# Patient Record
Sex: Female | Born: 1965
Health system: Southern US, Community
[De-identification: ages and names within clinical notes are randomized; demographics above are authoritative.]

## PROBLEM LIST (undated history)

## (undated) ENCOUNTER — Ambulatory Visit (HOSPITAL_COMMUNITY): Admission: EM | Payer: 59 | Source: Home / Self Care

## (undated) ENCOUNTER — Ambulatory Visit: Admission: EM

## (undated) DIAGNOSIS — K219 Gastro-esophageal reflux disease without esophagitis: Secondary | ICD-10-CM

## (undated) DIAGNOSIS — N2889 Other specified disorders of kidney and ureter: Secondary | ICD-10-CM

## (undated) DIAGNOSIS — K8020 Calculus of gallbladder without cholecystitis without obstruction: Secondary | ICD-10-CM

## (undated) DIAGNOSIS — D649 Anemia, unspecified: Secondary | ICD-10-CM

## (undated) DIAGNOSIS — T7840XA Allergy, unspecified, initial encounter: Secondary | ICD-10-CM

## (undated) DIAGNOSIS — K5790 Diverticulosis of intestine, part unspecified, without perforation or abscess without bleeding: Secondary | ICD-10-CM

## (undated) DIAGNOSIS — I10 Essential (primary) hypertension: Secondary | ICD-10-CM

## (undated) DIAGNOSIS — K259 Gastric ulcer, unspecified as acute or chronic, without hemorrhage or perforation: Secondary | ICD-10-CM

## (undated) HISTORY — DX: Calculus of gallbladder without cholecystitis without obstruction: K80.20

## (undated) HISTORY — DX: Essential (primary) hypertension: I10

## (undated) HISTORY — DX: Allergy, unspecified, initial encounter: T78.40XA

## (undated) HISTORY — DX: Other specified disorders of kidney and ureter: N28.89

## (undated) HISTORY — DX: Diverticulosis of intestine, part unspecified, without perforation or abscess without bleeding: K57.90

## (undated) HISTORY — DX: Gastric ulcer, unspecified as acute or chronic, without hemorrhage or perforation: K25.9

## (undated) HISTORY — PX: CHOLECYSTECTOMY: SHX55

## (undated) HISTORY — DX: Gastro-esophageal reflux disease without esophagitis: K21.9

## (undated) HISTORY — PX: ABDOMINAL HYSTERECTOMY: SHX81

## (undated) HISTORY — DX: Anemia, unspecified: D64.9

---

## 1991-03-18 DIAGNOSIS — K8020 Calculus of gallbladder without cholecystitis without obstruction: Secondary | ICD-10-CM

## 1991-03-18 HISTORY — DX: Calculus of gallbladder without cholecystitis without obstruction: K80.20

## 2016-04-14 ENCOUNTER — Ambulatory Visit (INDEPENDENT_AMBULATORY_CARE_PROVIDER_SITE_OTHER): Payer: Medicare Other | Admitting: Emergency Medicine

## 2016-04-14 VITALS — BP 98/66 | HR 90 | Temp 98.3°F | Resp 16 | Ht 63.25 in | Wt 191.0 lb

## 2016-04-14 DIAGNOSIS — I10 Essential (primary) hypertension: Secondary | ICD-10-CM

## 2016-04-14 MED ORDER — AMLODIPINE BESY-BENAZEPRIL HCL 10-40 MG PO CAPS: 1.0000 | ORAL_CAPSULE | Freq: Every day | ORAL | 11 refills | Status: DC

## 2016-04-14 MED ORDER — ZOLPIDEM TARTRATE 10 MG PO TABS: 10.0000 mg | ORAL_TABLET | Freq: Every evening | ORAL | 2 refills | Status: DC | PRN

## 2016-04-14 MED ORDER — HYDROCHLOROTHIAZIDE 25 MG PO TABS: 25.0000 mg | ORAL_TABLET | Freq: Every day | ORAL | 11 refills | Status: DC

## 2016-04-14 NOTE — Progress Notes (Signed)
Natalie Edwards 51 y.o.   Chief Complaint  Patient presents with  . estab care    HISTORY OF PRESENT ILLNESS: This is a 51 y.o. female recently moved to the area; needs to establish care and med refills.  HPI   Prior to Admission medications   Medication Sig Start Date End Date Taking? Authorizing Provider  amLODipine-benazepril (LOTREL) 10-40 MG capsule Take 1 capsule by mouth daily. 04/14/16 05/14/16 Yes Silvia Hightower Joellen Jersey, MD  famotidine (PEPCID) 40 MG tablet Take 40 mg by mouth daily.   Yes Historical Provider, MD  hydrochlorothiazide (HYDRODIURIL) 25 MG tablet Take 1 tablet (25 mg total) by mouth daily. 04/14/16 05/14/16 Yes Nadira Single Joellen Jersey, MD  hydrOXYzine (ATARAX/VISTARIL) 25 MG tablet Take 25 mg by mouth 3 (three) times daily as needed.   Yes Historical Provider, MD  ibuprofen (ADVIL,MOTRIN) 800 MG tablet Take 800 mg by mouth every 8 (eight) hours as needed.   Yes Historical Provider, MD  zolpidem (AMBIEN) 10 MG tablet Take 1 tablet (10 mg total) by mouth at bedtime as needed for sleep. 04/14/16 05/14/16 Yes [place], MD    No Known Allergies  There are no active problems to display for this patient.   Past Medical History:  Diagnosis Date  . Allergy   . Hypertension     No past surgical history on file.  Social History   Social History  . Marital status: Single    Spouse name: N/A  . Number of children: N/A  . Years of education: N/A   Occupational History  . Not on file.   Social History Main Topics  . Smoking status: Current Every Day Smoker  . Smokeless tobacco: Never Used  . Alcohol use Yes     Comment: occasional  . Drug use: No  . Sexual activity: Not on file     Comment: hysterectomy   Other Topics Concern  . Not on file   Social History Narrative  . No narrative on file    No family history on file.   Review of Systems  Constitutional: Negative for chills, fever, malaise/fatigue and weight loss.  HENT: Negative for  congestion, ear pain, hearing loss, nosebleeds, sinus pain and sore throat.   Eyes: Negative for blurred vision, double vision, discharge and redness.  Respiratory: Negative for cough, hemoptysis, shortness of breath and wheezing.   Cardiovascular: Negative for chest pain, palpitations, claudication and leg swelling.  Gastrointestinal: Negative for abdominal pain, blood in stool, diarrhea, melena, nausea and vomiting.  Genitourinary: Negative for dysuria, flank pain and hematuria.  Musculoskeletal: Negative for back pain.  Skin: Negative for rash.  Neurological: Negative for dizziness, weakness and headaches.  Endo/Heme/Allergies: Does not bruise/bleed easily.  Psychiatric/Behavioral: The patient has insomnia.   All other systems reviewed and are negative.  Vitals:   04/14/16 1410 04/14/16 1449  BP: 130/90 98/66  Pulse: 90   Resp: 16   Temp: 98.3 F (36.8 C)      Physical Exam  Constitutional: She is oriented to person, place, and time. She appears well-developed and well-nourished.  HENT:  Head: Normocephalic and atraumatic.  Nose: Nose normal.  Mouth/Throat: Oropharynx is clear and moist.  Eyes: Conjunctivae and EOM are normal. Pupils are equal, round, and reactive to light.  Neck: Normal range of motion. Neck supple.  Cardiovascular: Normal rate, regular rhythm and normal heart sounds.   Pulmonary/Chest: Effort normal and breath sounds normal.  Abdominal: Soft. Bowel sounds are normal. She exhibits no distension. There  is no tenderness.  Musculoskeletal: Normal range of motion.  Neurological: She is alert and oriented to person, place, and time. No sensory deficit. She exhibits normal muscle tone.  Skin: Skin is warm and dry. Capillary refill takes less than 2 seconds.  Psychiatric: She has a normal mood and affect. Her behavior is normal.  Vitals reviewed.    ASSESSMENT & PLAN: [name] was seen today for estab care.  Diagnoses and all orders for this visit:  Essential  hypertension -     CBC with Differential/Platelet -     Comprehensive metabolic panel -     TSH -     Lipid panel  Other orders -     zolpidem (AMBIEN) 10 MG tablet; Take 1 tablet (10 mg total) by mouth at bedtime as needed for sleep. -     hydrochlorothiazide (HYDRODIURIL) 25 MG tablet; Take 1 tablet (25 mg total) by mouth daily. -     amLODipine-benazepril (LOTREL) 10-40 MG capsule; Take 1 capsule by mouth daily.       Agustina Caroli, MD [facility]

## 2016-04-14 NOTE — Patient Instructions (Signed)
     IF you received an x-ray today, you will receive an invoice from [facility]. Please contact [facility] at [phone] with questions or concerns regarding your invoice.   IF you received labwork today, you will receive an invoice from [facility]. Please contact [facility] at [phone] with questions or concerns regarding your invoice.   Our billing staff will not be able to assist you with questions regarding bills from these companies.  You will be contacted with the lab results as soon as they are available. The fastest way to get your results is to activate your My Chart account. Instructions are located on the last page of this paperwork. If you have not heard from us regarding the results in 2 weeks, please contact this office.     

## 2016-04-15 LAB — LIPID PANEL
CHOL/HDL RATIO: 4.3 ratio (ref 0.0–4.4)
Cholesterol, Total: 215 mg/dL — ABNORMAL HIGH (ref 100–199)
HDL: 50 mg/dL (ref >39)
LDL Calculated: 133 mg/dL — ABNORMAL HIGH (ref 0–99)
Triglycerides: 162 mg/dL — ABNORMAL HIGH (ref 0–149)
VLDL Cholesterol Cal: 32 mg/dL (ref 5–40)

## 2016-04-15 LAB — CBC WITH DIFFERENTIAL/PLATELET
BASOS ABS: 0 10*3/uL (ref 0.0–0.2)
BASOS: 0 %
EOS (ABSOLUTE): 0.3 10*3/uL (ref 0.0–0.4)
Eos: 2 %
Hematocrit: 36.9 % (ref 34.0–46.6)
Hemoglobin: 11.9 g/dL (ref 11.1–15.9)
Immature Grans (Abs): 0 10*3/uL (ref 0.0–0.1)
Immature Granulocytes: 0 %
LYMPHS ABS: 6.2 10*3/uL — AB (ref 0.7–3.1)
Lymphs: 40 %
MCH: 25.1 pg — AB (ref 26.6–33.0)
MCHC: 32.2 g/dL (ref 31.5–35.7)
MCV: 78 fL — AB (ref 79–97)
Monocytes Absolute: 0.8 10*3/uL (ref 0.1–0.9)
Monocytes: 5 %
NEUTROS ABS: 8.3 10*3/uL — AB (ref 1.4–7.0)
Neutrophils: 53 %
Platelets: 552 10*3/uL — ABNORMAL HIGH (ref 150–379)
RBC: 4.75 x10E6/uL (ref 3.77–5.28)
RDW: 17.8 % — ABNORMAL HIGH (ref 12.3–15.4)
WBC: 15.6 10*3/uL — ABNORMAL HIGH (ref 3.4–10.8)

## 2016-04-15 LAB — TSH: TSH: 1.05 u[IU]/mL (ref 0.450–4.500)

## 2016-04-15 LAB — COMPREHENSIVE METABOLIC PANEL
A/G RATIO: 1.5 (ref 1.2–2.2)
ALK PHOS: 88 IU/L (ref 39–117)
ALT: 25 IU/L (ref 0–32)
AST: 15 IU/L (ref 0–40)
Albumin: 4.3 g/dL (ref 3.5–5.5)
BUN/Creatinine Ratio: 13 (ref 9–23)
BUN: 16 mg/dL (ref 6–24)
CHLORIDE: 106 mmol/L (ref 96–106)
CO2: 19 mmol/L (ref 18–29)
Calcium: 9.6 mg/dL (ref 8.7–10.2)
Creatinine, Ser: 1.22 mg/dL — ABNORMAL HIGH (ref 0.57–1.00)
GFR calc non Af Amer: 51 mL/min/{1.73_m2} — ABNORMAL LOW (ref >59)
GFR, EST AFRICAN AMERICAN: 59 mL/min/{1.73_m2} — AB (ref >59)
GLUCOSE: 98 mg/dL (ref 65–99)
Globulin, Total: 2.8 g/dL (ref 1.5–4.5)
POTASSIUM: 4.7 mmol/L (ref 3.5–5.2)
Sodium: 143 mmol/L (ref 134–144)
TOTAL PROTEIN: 7.1 g/dL (ref 6.0–8.5)

## 2016-04-16 NOTE — Addendum Note (Signed)
Addended by: Davina Poke on: 04/16/2016 10:33 AM   Modules accepted: Orders

## 2016-04-21 ENCOUNTER — Other Ambulatory Visit: Payer: Self-pay | Admitting: *Deleted

## 2016-04-21 ENCOUNTER — Telehealth: Payer: Self-pay

## 2016-04-21 MED ORDER — FAMOTIDINE 40 MG PO TABS: 40.0000 mg | ORAL_TABLET | Freq: Every day | ORAL | 0 refills | Status: DC

## 2016-04-21 MED ORDER — IBUPROFEN 800 MG PO TABS: 800.0000 mg | ORAL_TABLET | Freq: Three times a day (TID) | ORAL | 1 refills | Status: DC | PRN

## 2016-04-21 NOTE — Telephone Encounter (Signed)
yes

## 2016-04-21 NOTE — Telephone Encounter (Signed)
PATIENT STATES SHE CAME IN LAST Monday TO ESTABLISH CARE WITH DR. SAGARDIA. SHE SAID SHE TOLD HIM ALL THE MEDICATIONS THAT SHE TAKES, BUT 2 OF THEM WERE NOT CALLED INTO HER PHARMACY. SHE NEEDS TO GET HER FAMOTIDINE 40 MG AND IBUPROFEN 800 MG. SHE SAID SHE WOKE UP THIS MORNING IN SEVERE STOMACH PAIN AND SHE REALLY CAN'T WAIT 24 TO 3 HOURS FOR A CALL BACK. SHE IS ABOUT TO GO INTO AN ANXIETY ATTACK BECAUSE SHE IS IN SO MUCH PAIN. SHE WOULD LIKE TO GET HER FAMOTIDINE FILLED AS SOON AS POSSIBLE. BEST PHONE [phone] (CELL) PHARMACY CHOICE IS [facility] ON [address].  [place]

## 2016-04-21 NOTE — Progress Notes (Unsigned)
90

## 2016-04-21 NOTE — Telephone Encounter (Signed)
Ok to fill these?

## 2016-06-19 ENCOUNTER — Other Ambulatory Visit: Payer: Self-pay | Admitting: Emergency Medicine

## 2016-07-04 ENCOUNTER — Other Ambulatory Visit: Payer: Self-pay | Admitting: Emergency Medicine

## 2016-07-05 NOTE — Telephone Encounter (Signed)
04/14/16 last ov and refill

## 2016-07-05 NOTE — Telephone Encounter (Signed)
Called to [facility]. 

## 2016-07-09 DIAGNOSIS — Z0271 Encounter for disability determination: Secondary | ICD-10-CM

## 2016-07-23 ENCOUNTER — Other Ambulatory Visit: Payer: Self-pay | Admitting: Emergency Medicine

## 2016-07-31 ENCOUNTER — Encounter (HOSPITAL_COMMUNITY): Payer: Self-pay

## 2016-07-31 ENCOUNTER — Emergency Department (HOSPITAL_COMMUNITY)
Admission: EM | Admit: 2016-07-31 | Discharge: 2016-07-31 | Disposition: A | Discharge status: Home / Self Care | Payer: Medicare Other | Attending: Emergency Medicine | Admitting: Emergency Medicine

## 2016-07-31 ENCOUNTER — Emergency Department (HOSPITAL_COMMUNITY): Payer: Medicare Other

## 2016-07-31 DIAGNOSIS — Z79899 Other long term (current) drug therapy: Secondary | ICD-10-CM | POA: Insufficient documentation

## 2016-07-31 DIAGNOSIS — Z8679 Personal history of other diseases of the circulatory system: Secondary | ICD-10-CM | POA: Insufficient documentation

## 2016-07-31 DIAGNOSIS — K2900 Acute gastritis without bleeding: Secondary | ICD-10-CM | POA: Diagnosis not present

## 2016-07-31 DIAGNOSIS — I10 Essential (primary) hypertension: Secondary | ICD-10-CM | POA: Insufficient documentation

## 2016-07-31 DIAGNOSIS — R109 Unspecified abdominal pain: Secondary | ICD-10-CM | POA: Diagnosis present

## 2016-07-31 DIAGNOSIS — F17200 Nicotine dependence, unspecified, uncomplicated: Secondary | ICD-10-CM | POA: Insufficient documentation

## 2016-07-31 DIAGNOSIS — Z87898 Personal history of other specified conditions: Secondary | ICD-10-CM | POA: Insufficient documentation

## 2016-07-31 LAB — CBC
HEMATOCRIT: 37.1 % (ref 36.0–46.0)
HEMOGLOBIN: 12.3 g/dL (ref 12.0–15.0)
MCH: 26.1 pg (ref 26.0–34.0)
MCHC: 33.2 g/dL (ref 30.0–36.0)
MCV: 78.8 fL (ref 78.0–100.0)
Platelets: 491 10*3/uL — ABNORMAL HIGH (ref 150–400)
RBC: 4.71 MIL/uL (ref 3.87–5.11)
RDW: 17.9 % — ABNORMAL HIGH (ref 11.5–15.5)
WBC: 16.4 10*3/uL — ABNORMAL HIGH (ref 4.0–10.5)

## 2016-07-31 LAB — COMPREHENSIVE METABOLIC PANEL
ALBUMIN: 3.9 g/dL (ref 3.5–5.0)
ALT: 17 U/L (ref 14–54)
ANION GAP: 8 (ref 5–15)
AST: 18 U/L (ref 15–41)
Alkaline Phosphatase: 74 U/L (ref 38–126)
BILIRUBIN TOTAL: 0.3 mg/dL (ref 0.3–1.2)
BUN: 8 mg/dL (ref 6–20)
CHLORIDE: 108 mmol/L (ref 101–111)
CO2: 24 mmol/L (ref 22–32)
Calcium: 9.5 mg/dL (ref 8.9–10.3)
Creatinine, Ser: 1.02 mg/dL — ABNORMAL HIGH (ref 0.44–1.00)
GFR calc Af Amer: >60 mL/min (ref >60)
GFR calc non Af Amer: >60 mL/min (ref >60)
GLUCOSE: 93 mg/dL (ref 65–99)
POTASSIUM: 4 mmol/L (ref 3.5–5.1)
SODIUM: 140 mmol/L (ref 135–145)
TOTAL PROTEIN: 7.4 g/dL (ref 6.5–8.1)

## 2016-07-31 LAB — LIPASE, BLOOD: LIPASE: 22 U/L (ref 11–51)

## 2016-07-31 MED ORDER — IOPAMIDOL (ISOVUE-300) INJECTION 61%
INTRAVENOUS | Status: AC
  Administered 2016-07-31: 100 mL
  Filled 2016-07-31: qty 100

## 2016-07-31 MED ORDER — SUCRALFATE 1 G PO TABS: 1.0000 g | ORAL_TABLET | Freq: Three times a day (TID) | ORAL | 0 refills | Status: DC

## 2016-07-31 MED ORDER — ESOMEPRAZOLE MAGNESIUM 40 MG PO CPDR: 40.0000 mg | DELAYED_RELEASE_CAPSULE | Freq: Every day | ORAL | 0 refills | Status: DC

## 2016-07-31 NOTE — Discharge Instructions (Signed)
Please follow up with your family doctor to recheck you white blood cell count that was elevated today.  You had a CT scan of your abdomen that noted findings that will need to be followed up by your family doctor (small cysts on your kidneys and adrenal gland).  Get rechecked immediately if you have any new or worrisome symptoms.

## 2016-07-31 NOTE — ED Notes (Signed)
Pt verbalizes DC teaching and need to follow up with PCP and GI. NAD. Ambulatory at [facility].

## 2016-07-31 NOTE — ED Notes (Signed)
Patient up to desk, asking about delays for rooming.  This RN stated she would review her results and room her appropriately.  Patient stating she would like to leave.  This RN will follow up with Pod C provider.

## 2016-07-31 NOTE — ED Notes (Signed)
Patient updated on rooming 

## 2016-07-31 NOTE — ED Triage Notes (Signed)
Pt arrives to ER by Urgent Care to have a CT scan done. Pt reports that she has been having LUQ abdominal pain and tenderness. She went to urgent care and had an abdominal xray done which showed an unidentified object to LUQ of her abdomen. Possible "surgical clip" but the FNP at urgent care, Katheran Awe, stated to his RN via telephone, that the unidentified object is too high up to be a surgical clip because the only abdominal surgical procedure the patient has ever had was a hysterectomy in 2008.

## 2016-07-31 NOTE — ED Provider Notes (Signed)
[facility] DEPT Provider Note   CSN: [id number] Arrival date & time: 07/31/16  1611  By signing my name below, I, Natalie Edwards, attest that this documentation has been prepared under the direction and in the presence of No att. providers found. Electronically Signed: Mayer Edwards, Scribe. 07/31/16. 7:11 PM.  History   Chief Complaint Chief Complaint  Patient presents with  . needs ct  The history is provided by the patient. No language interpreter was used.   HPI Comments: Natalie Edwards is a 51 y.o. female with a PMHx of stomach ulcers and HTN who presents to the Emergency Department complaining of constant, chronic, left-sided abdominal pain for years. She states she went to urgent care for her symptoms, but they sent her to the ED to get a CT scan. She notes she has been increasingly taking her Pepcid to alleviate the pain. She states she was taking ibuprofen but stopped. She notes she does not have a gastroenterologist or PCP in the area as she just moved here. She denies associated fever, nausea, vomiting, diarrhea, black/bloody stools, and dysuria.   Past Medical History:  Diagnosis Date  . Allergy   . Hypertension     There are no active problems to display for this patient.   Past Surgical History:  Procedure Laterality Date  . ABDOMINAL HYSTERECTOMY      OB History    No data available       Home Medications    Prior to Admission medications   Medication Sig Start Date End Date Taking? Authorizing Provider  amLODipine-benazepril (LOTREL) 10-40 MG capsule Take 1 capsule by mouth daily. 04/14/16 05/14/16  Horald Pollen, MD  esomeprazole (NEXIUM) 40 MG capsule Take 1 capsule (40 mg total) by mouth daily. 07/31/16   Quintella Reichert, MD  hydrochlorothiazide (HYDRODIURIL) 25 MG tablet Take 1 tablet (25 mg total) by mouth daily. 04/14/16 05/14/16  Horald Pollen, MD  hydrOXYzine (ATARAX/VISTARIL) 25 MG tablet Take 25 mg by mouth 3 (three) times daily as  needed.    [provider]  sucralfate (CARAFATE) 1 g tablet Take 1 tablet (1 g total) by mouth 4 (four) times daily -  with meals and at bedtime. 07/31/16   Quintella Reichert, MD  zolpidem (AMBIEN) 10 MG tablet TAKE 1 TABLET BY MOUTH AT BEDTIME AS NEEDED FOR SLEEP 07/05/16   Horald Pollen, MD    Family History No family history on file.  Social History Social History  Substance Use Topics  . Smoking status: Current Every Day Smoker  . Smokeless tobacco: Never Used  . Alcohol use Yes     Comment: occasional     Allergies   Patient has no known allergies.   Review of Systems Review of Systems  Constitutional: Negative for fever.  Gastrointestinal: Positive for abdominal pain. Negative for diarrhea, nausea and vomiting.  Genitourinary: Negative for dysuria.  All other systems reviewed and are negative.    Physical Exam Updated Vital Signs BP 136/80 (BP Location: Left Arm)   Pulse 78   Temp 97.9 F (36.6 C) (Oral)   Resp 18   SpO2 98%   Physical Exam  Constitutional: She is oriented to person, place, and time. She appears well-developed and well-nourished.  HENT:  Head: Normocephalic and atraumatic.  Cardiovascular: Normal rate and regular rhythm.   No murmur heard. Pulmonary/Chest: Effort normal and breath sounds normal. No respiratory distress.  Abdominal: Soft. There is no tenderness. There is no rebound and no guarding.  Mild  LUQ, left mid abdominal tenderness  Musculoskeletal: She exhibits no edema or tenderness.  Neurological: She is alert and oriented to person, place, and time.  Skin: Skin is warm and dry.  Psychiatric: She has a normal mood and affect. Her behavior is normal.  Nursing note and vitals reviewed.    ED Treatments / Results  DIAGNOSTIC STUDIES: Oxygen Saturation is 100% on RA, normal by my interpretation.    COORDINATION OF CARE: 7:08 PM Discussed treatment plan with pt at bedside and pt agreed to plan. Labs (all labs  ordered are listed, but only abnormal results are displayed) Labs Reviewed  COMPREHENSIVE METABOLIC PANEL - Abnormal; Notable for the following:       Result Value   Creatinine, Ser 1.02 (*)    All other components within normal limits  CBC - Abnormal; Notable for the following:    WBC 16.4 (*)    RDW 17.9 (*)    Platelets 491 (*)    All other components within normal limits  LIPASE, BLOOD  URINALYSIS, ROUTINE W REFLEX MICROSCOPIC    EKG  EKG Interpretation None       Radiology Ct Abdomen Pelvis W Contrast  Result Date: 07/31/2016 CLINICAL DATA:  Left upper quadrant abdominal pain with tenderness. There is a report of x-ray showing an unidentified object in the left upper quadrant, possibly a surgical clip. EXAM: CT ABDOMEN AND PELVIS WITH CONTRAST TECHNIQUE: Multidetector CT imaging of the abdomen and pelvis was performed using the standard protocol following bolus administration of intravenous contrast. CONTRAST:  138mL ISOVUE-300 IOPAMIDOL (ISOVUE-300) INJECTION 61% COMPARISON:  None. FINDINGS: Lower chest: Clear lung bases.  Heart is normal in size. Hepatobiliary: No focal liver abnormality is seen. Status post cholecystectomy. No biliary dilatation. Pancreas: Unremarkable. No pancreatic ductal dilatation or surrounding inflammatory changes. Spleen: Normal in size without focal abnormality. Adrenals/Urinary Tract: 17 mm left adrenal mass with relative washout of 55% consistent with an adenoma. Normal right adrenal gland. Bilateral low-attenuation renal masses, largest on the right arising from the upper pole measuring 2.6 cm. Largest on the left arising from the midpole measuring 2.9 cm. The larger masses or cysts. The smaller masses are likely cysts but are too small to fully characterize. No renal stones. No hydronephrosis. Normal ureters. Normal bladder. Stomach/Bowel: The wall of the gastric antrum appears thickened. The stomach is not well distended. No other wall thickening. No  adjacent inflammation. Small bowel unremarkable. There are colonic diverticula mostly along the sigmoid. No diverticulitis. No other colon abnormality. Normal appendix visualized. Vascular/Lymphatic: Aortic atherosclerosis. No enlarged abdominal or pelvic lymph nodes. Reproductive: Status post hysterectomy. No adnexal masses. Other: No abdominal wall hernia or abnormality. No abdominopelvic ascites. Musculoskeletal: No acute or significant osseous findings. IMPRESSION: 1. Apparent wall thickening of the gastric antrum suggesting gastritis. This may, alternatively, be due to lack of distension. Recommend follow-up upper GI or endoscopy on a nonemergent basis. 2. No other evidence of an acute abnormality within the abdomen or pelvis. 3. Left adrenal adenoma. 4. Bilateral low-density renal masses consistent with cysts, some too small to fully characterize. 5. Colonic diverticula without evidence of diverticulitis. 6. Aortic atherosclerosis. 7. Status post cholecystectomy and hysterectomy. Electronically Signed   By: Lajean Manes M.D.   On: 07/31/2016 17:27    Procedures Procedures (including critical care time)  Medications Ordered in ED Medications  iopamidol (ISOVUE-300) 61 % injection (100 mLs  Contrast Given 07/31/16 1659)     Initial Impression / Assessment and Plan / ED Course  I have reviewed the triage vital signs and the nursing notes.  Pertinent labs & imaging results that were available during my care of the patient were reviewed by me and considered in my medical decision making (see chart for details).     Patient here for evaluation of abdominal pain, outpatient x-ray with clip and abdomen. CT abdomen in the emergency department demonstrates gastritis without evidence of perforation. CBC demonstrates leukocytosis and this is present on priors. The patient does not have acute abdomen on examination. Counseled patient on home care for likely gastritis, question peptic ulcer disease.  Discussed outpatient follow-up as well as return precautions. Also discussed incidental findings on CT scan and need for outpatient follow-up for recheck.  Final Clinical Impressions(s) / ED Diagnoses   Final diagnoses:  Acute gastritis without hemorrhage, unspecified gastritis type    New Prescriptions Discharge Medication List as of 07/31/2016  7:18 PM    START taking these medications   Details  esomeprazole (NEXIUM) 40 MG capsule Take 1 capsule (40 mg total) by mouth daily., Starting Thu 07/31/2016, Print    sucralfate (CARAFATE) 1 g tablet Take 1 tablet (1 g total) by mouth 4 (four) times daily -  with meals and at bedtime., Starting Thu 07/31/2016, Print      I personally performed the services described in this documentation, which was scribed in my presence. The recorded information has been reviewed and is accurate.     Quintella Reichert, MD 07/31/16 2026

## 2016-09-04 ENCOUNTER — Telehealth: Payer: Self-pay | Admitting: Hematology and Oncology

## 2016-09-04 NOTE — Telephone Encounter (Signed)
Spoke with patient re new patient appointment with Dr. Lebron Conners 6/26 @ 11 am. Demographic and insurance information confirmed.

## 2016-09-08 ENCOUNTER — Encounter: Payer: Medicare Other | Admitting: Hematology and Oncology

## 2016-09-09 ENCOUNTER — Ambulatory Visit (HOSPITAL_BASED_OUTPATIENT_CLINIC_OR_DEPARTMENT_OTHER): Payer: Medicare Other | Admitting: Hematology and Oncology

## 2016-09-09 ENCOUNTER — Encounter: Payer: Self-pay | Admitting: Hematology and Oncology

## 2016-09-09 ENCOUNTER — Ambulatory Visit (HOSPITAL_BASED_OUTPATIENT_CLINIC_OR_DEPARTMENT_OTHER): Payer: Medicare Other

## 2016-09-09 ENCOUNTER — Other Ambulatory Visit (HOSPITAL_COMMUNITY)
Admission: RE | Admit: 2016-09-09 | Discharge: 2016-09-09 | Disposition: A | Discharge status: Home / Self Care | Payer: Medicare Other | Source: Ambulatory Visit | Attending: Hematology and Oncology | Admitting: Hematology and Oncology

## 2016-09-09 ENCOUNTER — Telehealth: Payer: Self-pay | Admitting: Hematology and Oncology

## 2016-09-09 VITALS — BP 121/61 | HR 78 | Temp 98.0°F | Resp 20 | Ht 63.0 in | Wt 187.8 lb

## 2016-09-09 DIAGNOSIS — D72829 Elevated white blood cell count, unspecified: Secondary | ICD-10-CM

## 2016-09-09 DIAGNOSIS — D75839 Thrombocytosis, unspecified: Secondary | ICD-10-CM

## 2016-09-09 DIAGNOSIS — K219 Gastro-esophageal reflux disease without esophagitis: Secondary | ICD-10-CM

## 2016-09-09 DIAGNOSIS — D473 Essential (hemorrhagic) thrombocythemia: Secondary | ICD-10-CM | POA: Diagnosis present

## 2016-09-09 DIAGNOSIS — K3189 Other diseases of stomach and duodenum: Secondary | ICD-10-CM | POA: Insufficient documentation

## 2016-09-09 DIAGNOSIS — K317 Polyp of stomach and duodenum: Secondary | ICD-10-CM | POA: Insufficient documentation

## 2016-09-09 LAB — CBC & DIFF AND RETIC
BASO%: 0.1 % (ref 0.0–2.0)
Basophils Absolute: 0 10*3/uL (ref 0.0–0.1)
EOS%: 2.8 % (ref 0.0–7.0)
Eosinophils Absolute: 0.4 10*3/uL (ref 0.0–0.5)
HCT: 35.6 % (ref 34.8–46.6)
HGB: 11.4 g/dL — ABNORMAL LOW (ref 11.6–15.9)
IMMATURE RETIC FRACT: 9.5 % (ref 1.60–10.00)
LYMPH#: 5.9 10*3/uL — AB (ref 0.9–3.3)
LYMPH%: 43.6 % (ref 14.0–49.7)
MCH: 25.8 pg (ref 25.1–34.0)
MCHC: 32 g/dL (ref 31.5–36.0)
MCV: 80.5 fL (ref 79.5–101.0)
MONO#: 1 10*3/uL — AB (ref 0.1–0.9)
MONO%: 7.6 % (ref 0.0–14.0)
NEUT%: 45.9 % (ref 38.4–76.8)
NEUTROS ABS: 6.2 10*3/uL (ref 1.5–6.5)
Platelets: 523 10*3/uL — ABNORMAL HIGH (ref 145–400)
RBC: 4.42 10*6/uL (ref 3.70–5.45)
RDW: 16.8 % — ABNORMAL HIGH (ref 11.2–14.5)
RETIC CT ABS: 41.55 10*3/uL (ref 33.70–90.70)
Retic %: 0.94 % (ref 0.70–2.10)
WBC: 13.6 10*3/uL — ABNORMAL HIGH (ref 3.9–10.3)

## 2016-09-09 LAB — IRON AND TIBC
%SAT: 7 % — ABNORMAL LOW (ref 21–57)
IRON: 24 ug/dL — AB (ref 41–142)
TIBC: 344 ug/dL (ref 236–444)
UIBC: 320 ug/dL (ref 120–384)

## 2016-09-09 LAB — CHCC SMEAR

## 2016-09-09 LAB — LACTATE DEHYDROGENASE: LDH: 197 U/L (ref 125–245)

## 2016-09-09 LAB — FERRITIN: FERRITIN: 23 ng/mL (ref 9–269)

## 2016-09-09 LAB — TECHNOLOGIST REVIEW

## 2016-09-09 NOTE — Telephone Encounter (Signed)
Appointments scheduled per 09/09/16 los. Lab added for today, per 09/09/16 los. Patient was given a copy of the AV report and appointment schedule, per 09/09/16 los.

## 2016-09-09 NOTE — Progress Notes (Signed)
[facility] New Visit:  Assessment: Thrombocytosis ([place]) 51 year old female with previous history of peptic ulcer disease with gastric ulcers and history of upper GI bleeding. Referred for evaluation of hematological abnormalities which consist of persistent leukocytosis and thrombocytosis.  Primary on my differential is gastrointestinal bleeding when recent imaging is taken into account demonstrating gastric wall thickening without evidence of retroperitoneal lymphadenopathy or splenomegaly. The thrombocytosis in this context would be reactive. Patient does have decrease in the count which supports this notion. Leukocytosis in this context may be due to an inflammatory reaction, stress, but also can be attributable to active tobacco abuse.  Alternatively, the pattern of elevated blood counts may be suggestive of presence of myelodysplastic syndrome or myeloproliferative neoplasm. Peripheral blood does not appear to contain any significant immature forms to suggest presence of acute leukemia although it is not fully excluded this point in time.   She is already scheduled to undergo EGD her this week which I agree with.  Plan: --Labs today: CBC/diff, CMP, LDH, JAK2 with reflex testing for CALR and MPL, Iron and ferritin --Proceed with EGD as scheduled --RTC 2 weeks for test result review   Orders Placed This Encounter  Procedures  . CBC & Diff and Retic    Standing Status:   Future    Number of Occurrences:   1    Standing Expiration Date:   09/09/2017  . Smear    Standing Status:   Future    Number of Occurrences:   1    Standing Expiration Date:   09/09/2017  . Ferritin    Standing Status:   Future    Number of Occurrences:   1    Standing Expiration Date:   09/09/2017  . Iron and TIBC    Standing Status:   Future    Number of Occurrences:   1    Standing Expiration Date:   09/09/2017  . Lactate dehydrogenase (LDH)    Standing Status:   Future    Number of  Occurrences:   1    Standing Expiration Date:   09/09/2017  . Flow Cytometry    Standing Status:   Future    Number of Occurrences:   1    Standing Expiration Date:   09/09/2017  . JAK2 (INCLUDING V617F AND EXON 12), MPL,& CALR W/RFL MPN PANEL (NGS)    Standing Status:   Future    Number of Occurrences:   1    Standing Expiration Date:   09/09/2017    All questions were answered.  . The patient knows to call the clinic with any problems, questions or concerns.  This note was electronically signed.    History of Presenting Illness Natalie Edwards 51 y.o. presenting to the [facility] for Evaluation of thrombocytosis. Please see hematological trends below in the oncological history. She has a history of chronic left abdominal pain that has been previously evaluated in [place], [place]. Patient does have a history of gastric ulcers, depression, tobacco abuse. She also has history of heavy menstrual bleeding and metrorrhagia which led to elective hysterectomy and possible bilateral oophorectomy in the past. Patient has recently relocated to [place] and presented to the emergency room on 07/31/16 with exacerbation of the abdominal pain leading to additional imaging and lab work. Patient has been on chronic esomeprazole, but does not believe that that has been helping her much recently.  She denies fevers, chills, night sweats. She does experience hot flashes and chills.  She complains of poor sleep due to persistent thoughts and also due to abdominal discomfort. Abdominal discomfort associated with gastroesophageal reflux, heartburn, occasional nausea and vomiting. Patient has had no episodes of hematemesis. She has had melanoma in the past, but not recently. She is prone to constipation and occasionally has red blood per rectum that is attributed to passage of hard stools irritating hemorrhoids. She also has chronic cough that is attributed to her reflux and ongoing tobacco abuse. Patient  denies any recent unexpected weight loss. Denies any new neurological, urological, or dermatological symptoms.  Oncological/Hematological History: --Labs, 04/14/16: WBC 15.6, Hgb 11.9, MCV 78.0, MCH 25.1, Plt 552 --Labs, 07/31/16: WBC 16.4, Hgb 12.3, MCV 78.8, MCH 26.1, Plt 491; Vit D 7.09 --CT A/P, 07/31/16: Gastric wall thickening. No retroperitoneal, perigastric, or pelvic lymphadenopathy. No splenomegaly. --Labs, 08/23/16: WBC 17.1, Hgb 11.6, MCV 82.0, MCH 26.0, Plt 628;   Medical History: Past Medical History:  Diagnosis Date  . Allergy   . Gall bladder stones 1993  . Hypertension     Surgical History: Past Surgical History:  Procedure Laterality Date  . ABDOMINAL HYSTERECTOMY      Family History: Family History  Problem Relation Age of Onset  . Cancer Maternal Uncle        Lung  . Cancer Maternal Uncle        Lung  . Cancer Maternal Uncle        Lung    Social History: Social History   Social History  . Marital status: Single    Spouse name: N/A  . Number of children: N/A  . Years of education: N/A   Occupational History  . Not on file.   Social History Main Topics  . Smoking status: Current Every Day Smoker    Packs/day: 0.25    Years: 25.00    Types: Cigarettes  . Smokeless tobacco: Never Used     Comment: Pt tried to quit - helps with anxiety   . Alcohol use Yes     Comment: occasional  . Drug use: No  . Sexual activity: Yes     Comment: hysterectomy   Other Topics Concern  . Not on file   Social History Narrative  . No narrative on file    Allergies: No Known Allergies  Medications:  Current Outpatient Prescriptions  Medication Sig Dispense Refill  . amLODipine-benazepril (LOTREL) 10-40 MG capsule Take 1 capsule by mouth daily. 30 capsule 11  . esomeprazole (NEXIUM) 40 MG capsule Take 1 capsule (40 mg total) by mouth daily. 30 capsule 0  . hydrochlorothiazide (HYDRODIURIL) 25 MG tablet Take 1 tablet (25 mg total) by mouth daily. 30  tablet 11  . hydrOXYzine (ATARAX/VISTARIL) 25 MG tablet Take 25 mg by mouth 3 (three) times daily as needed.    . sucralfate (CARAFATE) 1 g tablet Take 1 tablet (1 g total) by mouth 4 (four) times daily -  with meals and at bedtime. 120 tablet 0  . zolpidem (AMBIEN) 10 MG tablet TAKE 1 TABLET BY MOUTH AT BEDTIME AS NEEDED FOR SLEEP 30 tablet 2   No current facility-administered medications for this visit.     Review of Systems: Review of Systems  All other systems reviewed and are negative.    PHYSICAL EXAMINATION Blood pressure 121/61, pulse 78, temperature 98 F (36.7 C), temperature source Oral, resp. rate 20, height 5\' 3"  (1.6 m), weight 187 lb 12.8 oz (85.2 kg), SpO2 100 %.  ECOG PERFORMANCE STATUS: 1 - Symptomatic but  completely ambulatory  Physical Exam  Constitutional: She is oriented to person, place, and time and well-developed, well-nourished, and in no distress. Vital signs are normal. She appears not lethargic, not malnourished and not jaundiced. She appears not cachectic.  Non-toxic appearance. No distress.  HENT:  Head: Normocephalic. Head is without abrasion, without contusion and without laceration.  Mouth/Throat: Uvula is midline, oropharynx is clear and moist and mucous membranes are normal. Mucous membranes are not pale, not dry and not cyanotic. No oral lesions. No trismus in the jaw. Normal dentition. No dental abscesses.  Eyes: Conjunctivae and EOM are normal. Pupils are equal, round, and reactive to light. Right conjunctiva has no hemorrhage. Left conjunctiva has no hemorrhage. No scleral icterus.  Neck: Normal range of motion. No edema present. No thyroid mass and no thyromegaly present.  Cardiovascular: Normal rate, regular rhythm, S1 normal, S2 normal and normal heart sounds.  Exam reveals no gallop.   No murmur heard. Pulmonary/Chest: Effort normal and breath sounds normal. She has no decreased breath sounds. She has no wheezes. She has no rhonchi.   Abdominal: Soft. Normal appearance and bowel sounds are normal. There is no hepatosplenomegaly. There is no tenderness. There is no rebound.  Lymphadenopathy:       Head (right side): No submandibular and no occipital adenopathy present.       Head (left side): No submandibular and no occipital adenopathy present.    She has no cervical adenopathy.    She has no axillary adenopathy.       Right: No inguinal and no supraclavicular adenopathy present.       Left: No inguinal and no supraclavicular adenopathy present.  Neurological: She is alert and oriented to person, place, and time. She appears not lethargic. She displays normal reflexes. No cranial nerve deficit or sensory deficit. Gait normal.  Skin: She is not diaphoretic.     LABORATORY DATA: I have personally reviewed the data as listed as well as the peripheral blood smear:  Peripheral blood smear demonstrates mild anemia to normal red blood cell count with no evidence of schistocytes, spherocytes. White blood cell count is elevated without apparent left shift, basophilia, or atypical forms. Platelet count is elevated and occasional large platelets are seen without giant forms.  Appointment on 09/09/2016  Component Date Value Ref Range Status  . WBC 09/09/2016 13.6* 3.9 - 10.3 10e3/uL Final  . NEUT# 09/09/2016 6.2  1.5 - 6.5 10e3/uL Final  . HGB 09/09/2016 11.4* 11.6 - 15.9 g/dL Final  . HCT 09/09/2016 35.6  34.8 - 46.6 % Final  . Platelets 09/09/2016 523* 145 - 400 10e3/uL Final  . MCV 09/09/2016 80.5  79.5 - 101.0 fL Final  . MCH 09/09/2016 25.8  25.1 - 34.0 pg Final  . MCHC 09/09/2016 32.0  31.5 - 36.0 g/dL Final  . RBC 09/09/2016 4.42  3.70 - 5.45 10e6/uL Final  . RDW 09/09/2016 16.8* 11.2 - 14.5 % Final  . lymph# 09/09/2016 5.9* 0.9 - 3.3 10e3/uL Final  . MONO# 09/09/2016 1.0* 0.1 - 0.9 10e3/uL Final  . Eosinophils Absolute 09/09/2016 0.4  0.0 - 0.5 10e3/uL Final  . Basophils Absolute 09/09/2016 0.0  0.0 - 0.1 10e3/uL  Final  . NEUT% 09/09/2016 45.9  38.4 - 76.8 % Final  . LYMPH% 09/09/2016 43.6  14.0 - 49.7 % Final  . MONO% 09/09/2016 7.6  0.0 - 14.0 % Final  . EOS% 09/09/2016 2.8  0.0 - 7.0 % Final  . BASO% 09/09/2016 0.1  0.0 - 2.0 % Final  . Retic % 09/09/2016 0.94  0.70 - 2.10 % Final  . Retic Ct Abs 09/09/2016 41.55  33.70 - 90.70 10e3/uL Final  . Immature Retic Fract 09/09/2016 9.50  1.60 - 10.00 % Final  . Smear Result 09/09/2016 Smear Available   Final  . Ferritin 09/09/2016 23  9 - 269 ng/ml Final  . Iron 09/09/2016 24* 41 - 142 ug/dL Final  . TIBC 09/09/2016 344  236 - 444 ug/dL Final  . UIBC 09/09/2016 320  120 - 384 ug/dL Final  . %SAT 09/09/2016 7* 21 - 57 % Final  . LDH 09/09/2016 197  125 - 245 U/L Final  . Technologist Review 09/09/2016 Oc variant lymph   Final         Ardath Sax, MD

## 2016-09-09 NOTE — Assessment & Plan Note (Signed)
51 year old female with previous history of peptic ulcer disease with gastric ulcers and history of upper GI bleeding. Referred for evaluation of hematological abnormalities which consist of persistent leukocytosis and thrombocytosis.  Primary on my differential is gastrointestinal bleeding when recent imaging is taken into account demonstrating gastric wall thickening without evidence of retroperitoneal lymphadenopathy or splenomegaly. The thrombocytosis in this context would be reactive. Patient does have decrease in the count which supports this notion. Leukocytosis in this context may be due to an inflammatory reaction, stress, but also can be attributable to active tobacco abuse.  Alternatively, the pattern of elevated blood counts may be suggestive of presence of myelodysplastic syndrome or myeloproliferative neoplasm. Peripheral blood does not appear to contain any significant immature forms to suggest presence of acute leukemia although it is not fully excluded this point in time.   She is already scheduled to undergo EGD her this week which I agree with.  Plan: --Labs today: CBC/diff, CMP, LDH, JAK2 with reflex testing for CALR and MPL, Iron and ferritin --Proceed with EGD as scheduled --RTC 2 weeks for test result review

## 2016-09-11 LAB — FLOW CYTOMETRY

## 2016-09-23 ENCOUNTER — Telehealth: Payer: Self-pay | Admitting: Hematology and Oncology

## 2016-09-23 ENCOUNTER — Other Ambulatory Visit: Payer: Self-pay

## 2016-09-23 ENCOUNTER — Ambulatory Visit (HOSPITAL_BASED_OUTPATIENT_CLINIC_OR_DEPARTMENT_OTHER): Payer: Medicare Other | Admitting: Hematology and Oncology

## 2016-09-23 ENCOUNTER — Encounter: Payer: Self-pay | Admitting: Hematology and Oncology

## 2016-09-23 ENCOUNTER — Other Ambulatory Visit: Payer: Medicare Other

## 2016-09-23 VITALS — BP 108/75 | HR 79 | Temp 99.4°F | Resp 18 | Ht 63.0 in | Wt 184.2 lb

## 2016-09-23 DIAGNOSIS — D500 Iron deficiency anemia secondary to blood loss (chronic): Secondary | ICD-10-CM | POA: Diagnosis present

## 2016-09-23 DIAGNOSIS — K274 Chronic or unspecified peptic ulcer, site unspecified, with hemorrhage: Secondary | ICD-10-CM | POA: Diagnosis not present

## 2016-09-23 DIAGNOSIS — D75839 Thrombocytosis, unspecified: Secondary | ICD-10-CM

## 2016-09-23 DIAGNOSIS — D72829 Elevated white blood cell count, unspecified: Secondary | ICD-10-CM

## 2016-09-23 DIAGNOSIS — D473 Essential (hemorrhagic) thrombocythemia: Secondary | ICD-10-CM | POA: Diagnosis not present

## 2016-09-23 MED ORDER — FERROUS SULFATE 325 (65 FE) MG PO TBEC: 325.0000 mg | DELAYED_RELEASE_TABLET | Freq: Three times a day (TID) | ORAL | 3 refills | Status: DC

## 2016-09-23 NOTE — Progress Notes (Signed)
[facility] Follow-up Visit:  Assessment: Iron deficiency anemia due to chronic blood loss 51 year old female initially referred to our clinic for evaluation of leukocytosis and thrombocytosis. During evaluation mild normochromic anemia was identified. Additional testing demonstrated no evidence of dysmorphic red blood cells, white blood cells, or platelets. Iron panel demonstrates mild to moderate iron deficiency. Reported EGD demonstrated multifocal gastric ulcers likely the source of the blood loss and reactive leukocytosis and thrombocytosis.  At this time, I will defer management of the gastric ulcers to the managing gastroenterologist.  Plan: --We will obtain records re EGD --Start Fe Sulfate 325mg  PO QDay for one week, BID for one week, then TID if tolerated --RTC 2 months with labs and clinic visit  Voice recognition software was used and creation of this note. Despite my best effort at editing the text, some misspelling/errors may have occurred.   Orders Placed This Encounter  Procedures  . CBC & Diff and Retic    Standing Status:   Future    Standing Expiration Date:   09/23/2017  . Comprehensive metabolic panel    Standing Status:   Future    Standing Expiration Date:   09/23/2017  . Iron and TIBC    Standing Status:   Future    Standing Expiration Date:   09/23/2017  . Ferritin    Standing Status:   Future    Standing Expiration Date:   09/23/2017   All questions were answered.  . The patient knows to call the clinic with any problems, questions or concerns.  This note was electronically signed.    History of Presenting Illness Natalie Edwards 51 y.o. presenting to the [facility] forFollow-up regarding leukocytosis, thrombocytosis, and mild anemia. She has a history of chronic left abdominal pain that has been previously evaluated in [place], [place]. Patient does have a history of gastric ulcers, depression, tobacco abuse. She also has history of  heavy menstrual bleeding and metrorrhagia which led to elective hysterectomy and possible bilateral oophorectomy in the past. Patient has recently relocated to [place] and presented to the emergency room on 07/31/16 with exacerbation of the abdominal pain leading to additional imaging and lab work. Patient has been on chronic esomeprazole, but does not believe that that has been helping her much recently.  Since the last visit to the clinic, she underwent an upper endoscopy at [facility] and it was reported to her that she has multiple gastric ulcers. She does not know if active bleeding was noted at that time. Biopsies were obtained and results are pending. Patient has filled out the permission to obtain information.  She has had no significant new symptoms since the last visit to the clinic other than a single episode of lightheadedness and dizziness that has occurred yesterday and was associated with a headache. The ventral self-contained and has not repeated itself. She denies fevers, chills, night sweats. She does experience hot flashes and chills. She complains of poor sleep due to persistent thoughts and also due to abdominal discomfort. Abdominal discomfort associated with gastroesophageal reflux, heartburn, occasional nausea and vomiting. Patient has had no episodes of hematemesis. She has had melena in the past, but not recently. She is prone to constipation and occasionally has red blood per rectum that is attributed to passage of hard stools irritating hemorrhoids. She also has chronic cough that is attributed to her reflux and ongoing tobacco abuse. Patient denies any recent unexpected weight loss. Denies any new neurological, urological, or dermatological symptoms.  Oncological/hematological History:  No history exists.  --Labs, 04/14/16: WBC 15.6, Hgb 11.9, MCV 78.0, MCH 25.1, Plt 552 --Labs, 07/31/16: WBC 16.4, Hgb 12.3, MCV 78.8, MCH 26.1, Plt 491; Vit D 7.09 --CT A/P, 07/31/16:  Gastric wall thickening. No retroperitoneal, perigastric, or pelvic lymphadenopathy. No splenomegaly. --Labs, 08/23/16: WBC 17.1, Hgb 11.6, MCV 82.0, MCH 26.0, Plt 628;  --Labs, 09/09/16: WBC 13.6, Hgb 11.4, MCV 80.5, MCH 25.8, Plt 523; Fe 24, FeSat 7%, TIBC 344, Ferritin 23; pBlood Flow Cyto -- Negative for increase in blasts or a monoclonal B-cell population. Negative for JAK2 mutation  Medical History: Past Medical History:  Diagnosis Date  . Allergy   . Gall bladder stones 1993  . Hypertension     Surgical History: Past Surgical History:  Procedure Laterality Date  . ABDOMINAL HYSTERECTOMY      Family History: Family History  Problem Relation Age of Onset  . Cancer Maternal Uncle        Lung  . Cancer Maternal Uncle        Lung  . Cancer Maternal Uncle        Lung    Social History: Social History   Social History  . Marital status: Single    Spouse name: N/A  . Number of children: N/A  . Years of education: N/A   Occupational History  . Not on file.   Social History Main Topics  . Smoking status: Current Every Day Smoker    Packs/day: 0.25    Years: 25.00    Types: Cigarettes  . Smokeless tobacco: Never Used     Comment: Pt tried to quit - helps with anxiety   . Alcohol use Yes     Comment: occasional  . Drug use: No  . Sexual activity: Yes     Comment: hysterectomy   Other Topics Concern  . Not on file   Social History Narrative  . No narrative on file    Allergies: No Known Allergies  Medications:  Current Outpatient Prescriptions  Medication Sig Dispense Refill  . amLODipine-benazepril (LOTREL) 10-40 MG capsule Take 1 capsule by mouth daily. 30 capsule 11  . esomeprazole (NEXIUM) 40 MG capsule Take 1 capsule (40 mg total) by mouth daily. 30 capsule 0  . hydrochlorothiazide (HYDRODIURIL) 25 MG tablet Take 1 tablet (25 mg total) by mouth daily. 30 tablet 11  . hydrOXYzine (ATARAX/VISTARIL) 25 MG tablet Take 25 mg by mouth 3 (three) times  daily as needed.    . sucralfate (CARAFATE) 1 g tablet Take 1 tablet (1 g total) by mouth 4 (four) times daily -  with meals and at bedtime. 120 tablet 0  . zolpidem (AMBIEN) 10 MG tablet TAKE 1 TABLET BY MOUTH AT BEDTIME AS NEEDED FOR SLEEP 30 tablet 2   No current facility-administered medications for this visit.     Review of Systems: Review of Systems  All other systems reviewed and are negative.    PHYSICAL EXAMINATION Blood pressure 108/75, pulse 79, temperature 99.4 F (37.4 C), temperature source Oral, resp. rate 18, height 5\' 3"  (1.6 m), weight 184 lb 3.2 oz (83.6 kg), SpO2 100 %.  ECOG PERFORMANCE STATUS: 1 - Symptomatic but completely ambulatory  Physical Exam  Constitutional: She is oriented to person, place, and time and well-developed, well-nourished, and in no distress. Vital signs are normal. She appears not lethargic, not malnourished and not jaundiced. She appears not cachectic.  Non-toxic appearance. No distress.  HENT:  Head: Normocephalic. Head is without abrasion,  without contusion and without laceration.  Mouth/Throat: Uvula is midline, oropharynx is clear and moist and mucous membranes are normal. Mucous membranes are not pale, not dry and not cyanotic. No oral lesions. No trismus in the jaw. Normal dentition. No dental abscesses.  Eyes: Conjunctivae and EOM are normal. Pupils are equal, round, and reactive to light. Right conjunctiva has no hemorrhage. Left conjunctiva has no hemorrhage. No scleral icterus.  Neck: Normal range of motion. No edema present. No thyroid mass and no thyromegaly present.  Cardiovascular: Normal rate, regular rhythm, S1 normal, S2 normal and normal heart sounds.  Exam reveals no gallop.   No murmur heard. Pulmonary/Chest: Effort normal and breath sounds normal. She has no decreased breath sounds. She has no wheezes. She has no rhonchi.  Abdominal: Soft. Normal appearance and bowel sounds are normal. There is no hepatosplenomegaly. There  is no tenderness. There is no rebound.  Lymphadenopathy:       Head (right side): No submandibular and no occipital adenopathy present.       Head (left side): No submandibular and no occipital adenopathy present.    She has no cervical adenopathy.    She has no axillary adenopathy.       Right: No inguinal and no supraclavicular adenopathy present.       Left: No inguinal and no supraclavicular adenopathy present.  Neurological: She is alert and oriented to person, place, and time. She appears not lethargic. She displays normal reflexes. No cranial nerve deficit or sensory deficit. Gait normal.  Skin: She is not diaphoretic.     LABORATORY DATA: I have personally reviewed the data as listed: No visits with results within 1 Week(s) from this visit.  Latest known visit with results is:  Appointment on 09/09/2016  Component Date Value Ref Range Status  . WBC 09/09/2016 13.6* 3.9 - 10.3 10e3/uL Final  . NEUT# 09/09/2016 6.2  1.5 - 6.5 10e3/uL Final  . HGB 09/09/2016 11.4* 11.6 - 15.9 g/dL Final  . HCT 09/09/2016 35.6  34.8 - 46.6 % Final  . Platelets 09/09/2016 523* 145 - 400 10e3/uL Final  . MCV 09/09/2016 80.5  79.5 - 101.0 fL Final  . MCH 09/09/2016 25.8  25.1 - 34.0 pg Final  . MCHC 09/09/2016 32.0  31.5 - 36.0 g/dL Final  . RBC 09/09/2016 4.42  3.70 - 5.45 10e6/uL Final  . RDW 09/09/2016 16.8* 11.2 - 14.5 % Final  . lymph# 09/09/2016 5.9* 0.9 - 3.3 10e3/uL Final  . MONO# 09/09/2016 1.0* 0.1 - 0.9 10e3/uL Final  . Eosinophils Absolute 09/09/2016 0.4  0.0 - 0.5 10e3/uL Final  . Basophils Absolute 09/09/2016 0.0  0.0 - 0.1 10e3/uL Final  . NEUT% 09/09/2016 45.9  38.4 - 76.8 % Final  . LYMPH% 09/09/2016 43.6  14.0 - 49.7 % Final  . MONO% 09/09/2016 7.6  0.0 - 14.0 % Final  . EOS% 09/09/2016 2.8  0.0 - 7.0 % Final  . BASO% 09/09/2016 0.1  0.0 - 2.0 % Final  . Retic % 09/09/2016 0.94  0.70 - 2.10 % Final  . Retic Ct Abs 09/09/2016 41.55  33.70 - 90.70 10e3/uL Final  . Immature  Retic Fract 09/09/2016 9.50  1.60 - 10.00 % Final  . Smear Result 09/09/2016 Smear Available   Final  . Ferritin 09/09/2016 23  9 - 269 ng/ml Final  . Iron 09/09/2016 24* 41 - 142 ug/dL Final  . TIBC 09/09/2016 344  236 - 444 ug/dL Final  . UIBC 09/09/2016  320  120 - 384 ug/dL Final  . %SAT 09/09/2016 7* 21 - 57 % Final  . LDH 09/09/2016 197  125 - 245 U/L Final  . Flow Cytometry 09/09/2016 See Separate Report   Final  . Technologist Review 09/09/2016 Oc variant lymph   Final       Ardath Sax, MD

## 2016-09-23 NOTE — Telephone Encounter (Signed)
Scheduled appt epr 7/10 los - Gave patient AVS and calender per los. Lab and f/u in 2 months.

## 2016-09-23 NOTE — Assessment & Plan Note (Signed)
51 year old female initially referred to our clinic for evaluation of leukocytosis and thrombocytosis. During evaluation mild normochromic anemia was identified. Additional testing demonstrated no evidence of dysmorphic red blood cells, white blood cells, or platelets. Iron panel demonstrates mild to moderate iron deficiency. Reported EGD demonstrated multifocal gastric ulcers likely the source of the blood loss and reactive leukocytosis and thrombocytosis.  At this time, I will defer management of the gastric ulcers to the managing gastroenterologist.  Plan: --We will obtain records re EGD --Start Fe Sulfate 325mg  PO QDay for one week, BID for one week, then TID if tolerated --RTC 2 months with labs and clinic visit  Voice recognition software was used and creation of this note. Despite my best effort at editing the text, some misspelling/errors may have occurred.

## 2016-11-24 ENCOUNTER — Other Ambulatory Visit: Payer: Medicare Other

## 2016-11-24 ENCOUNTER — Ambulatory Visit: Payer: Medicare Other | Admitting: Hematology and Oncology

## 2016-12-02 ENCOUNTER — Telehealth: Payer: Self-pay | Admitting: Hematology and Oncology

## 2016-12-02 NOTE — Telephone Encounter (Signed)
Scheduled appt per 9/14 sch message - left message with appt date and time and sent reminder letter in the mail.

## 2016-12-11 ENCOUNTER — Ambulatory Visit: Payer: Medicare Other | Admitting: Hematology and Oncology

## 2016-12-11 ENCOUNTER — Other Ambulatory Visit: Payer: Medicare Other

## 2018-05-04 ENCOUNTER — Encounter: Payer: Self-pay | Admitting: Gastroenterology

## 2018-05-04 ENCOUNTER — Encounter: Payer: Self-pay | Admitting: Family Medicine

## 2018-05-04 ENCOUNTER — Ambulatory Visit (INDEPENDENT_AMBULATORY_CARE_PROVIDER_SITE_OTHER): Payer: Medicare Other | Admitting: Family Medicine

## 2018-05-04 VITALS — BP 103/70 | HR 82 | Resp 17 | Ht 64.0 in | Wt 182.0 lb

## 2018-05-04 DIAGNOSIS — Z1231 Encounter for screening mammogram for malignant neoplasm of breast: Secondary | ICD-10-CM

## 2018-05-04 DIAGNOSIS — I10 Essential (primary) hypertension: Secondary | ICD-10-CM | POA: Diagnosis not present

## 2018-05-04 DIAGNOSIS — F411 Generalized anxiety disorder: Secondary | ICD-10-CM | POA: Diagnosis not present

## 2018-05-04 DIAGNOSIS — D500 Iron deficiency anemia secondary to blood loss (chronic): Secondary | ICD-10-CM | POA: Diagnosis not present

## 2018-05-04 DIAGNOSIS — K257 Chronic gastric ulcer without hemorrhage or perforation: Secondary | ICD-10-CM | POA: Diagnosis not present

## 2018-05-04 DIAGNOSIS — Z7689 Persons encountering health services in other specified circumstances: Secondary | ICD-10-CM

## 2018-05-04 MED ORDER — HYDROXYZINE HCL 25 MG PO TABS: 25.0000 mg | ORAL_TABLET | Freq: Three times a day (TID) | ORAL | 2 refills | Status: DC | PRN

## 2018-05-04 MED ORDER — ZOLPIDEM TARTRATE 10 MG PO TABS: 10.0000 mg | ORAL_TABLET | Freq: Every evening | ORAL | 1 refills | Status: DC | PRN

## 2018-05-04 MED ORDER — AMLODIPINE BESY-BENAZEPRIL HCL 10-40 MG PO CAPS: 1.0000 | ORAL_CAPSULE | Freq: Every day | ORAL | 11 refills | Status: DC

## 2018-05-04 MED ORDER — HYDROCHLOROTHIAZIDE 25 MG PO TABS: 25.0000 mg | ORAL_TABLET | Freq: Every day | ORAL | 11 refills | Status: DC

## 2018-05-04 NOTE — Progress Notes (Signed)
Natalie Edwards, is a 53 y.o. female  HYQ:[id number]  [id number]:[id number]  DOB - [birth year]  CC:  Chief Complaint  Patient presents with  . Establish Care  . Hypertension       HPI: Natalie Edwards is a 53 y.o. female is here today to establish care.  Verl Whitmore has Thrombocytosis ([place]); Leukocytosis; History of hypertension; H/O ulcer disease; Iron deficiency anemia due to chronic blood loss; and Peptic ulcer disease with hemorrhage on their problem list.   Natalie Edwards presents today for a routine wellness visit. She has been without primary care for a few months and needs refills of blood pressure medication.  Hypertension: Well controlled with medication. No routine monitoring of blood pressure at home. Denies symptoms of chest pain, shortness of breath, dizziness, or weakness. No history of kidney disease or vision impairment.  Peptic Ulcer disease / Iron deficiency Anemia: History of iron deficiency anemia and diagnosis of gastric ulcer. History of GI bleed which was thought to be caused by chronic NSAID and worsening PUD. No recent GI follow-up and no known prior colonoscopy. PUD diagnosed by EGD. Denies hematemesis or dark tarry stools.  Insomnia Controlled with Ambien for several years. Suffers from Generalized Anxiety attributes anxious and rapid thoughts with an inability to settle to achieve sleep. Prescribed Hydroxyzine for anxiety. Symptoms are well controlled with medication. Denies any prior diagnosis of MDD. Denies suicidal ideations, homicidal ideations, or auditory hallucinations.   Patient denies new headaches, chest pain, abdominal pain, nausea, new weakness , numbness or tingling, SOB, edema, or worrisome cough.  Current medications: Current Outpatient Medications:  .  amLODipine-benazepril (LOTREL) 10-40 MG capsule, Take 1 capsule by mouth daily., Disp: 30 capsule, Rfl: 11 .  esomeprazole (NEXIUM) 40 MG capsule, Take 1 capsule (40 mg total) by mouth daily., Disp: 30 capsule,  Rfl: 0 .  ferrous sulfate 325 (65 FE) MG EC tablet, Take 1 tablet (325 mg total) by mouth 3 (three) times daily with meals., Disp: 90 tablet, Rfl: 3 .  hydrochlorothiazide (HYDRODIURIL) 25 MG tablet, Take 1 tablet (25 mg total) by mouth daily., Disp: 30 tablet, Rfl: 11 .  hydrOXYzine (ATARAX/VISTARIL) 25 MG tablet, Take 25 mg by mouth 3 (three) times daily as needed., Disp: , Rfl:  .  traZODone (DESYREL) 50 MG tablet, Take 1 tablet by mouth at bedtime., Disp: , Rfl:  .  zolpidem (AMBIEN) 10 MG tablet, TAKE 1 TABLET BY MOUTH AT BEDTIME AS NEEDED FOR SLEEP, Disp: 30 tablet, Rfl: 2   Pertinent family medical history: family history includes Cancer in her maternal uncle, maternal uncle, and maternal uncle.   No Known Allergies  Social History   Socioeconomic History  . Marital status: Single    Spouse name: Not on file  . Number of children: Not on file  . Years of education: Not on file  . Highest education level: Not on file  Occupational History  . Not on file  Social Needs  . Financial resource strain: Not on file  . Food insecurity:    Worry: Not on file    Inability: Not on file  . Transportation needs:    Medical: Not on file    Non-medical: Not on file  Tobacco Use  . Smoking status: Current Every Day Smoker    Packs/day: 0.25    Years: 25.00    Pack years: 6.25    Types: Cigarettes  . Smokeless tobacco: Never Used  . Tobacco comment: Pt tried to quit - helps with anxiety  Substance and Sexual Activity  . Alcohol use: Yes    Comment: occasional  . Drug use: No  . Sexual activity: Yes    Comment: hysterectomy  Lifestyle  . Physical activity:    Days per week: Not on file    Minutes per session: Not on file  . Stress: Not on file  Relationships  . Social connections:    Talks on phone: Not on file    Gets together: Not on file    Attends religious service: Not on file    Active member of club or organization: Not on file    Attends meetings of clubs or  organizations: Not on file    Relationship status: Not on file  . Intimate partner violence:    Fear of current or ex partner: Not on file    Emotionally abused: Not on file    Physically abused: Not on file    Forced sexual activity: Not on file  Other Topics Concern  . Not on file  Social History Narrative  . Not on file   Review of Systems: Pertinent negatives listed in HPI Objective:   Vitals:   05/04/18 1054  BP: 103/70  Pulse: 82  Resp: 17  SpO2: 95%    BP Readings from Last 3 Encounters:  05/04/18 103/70  09/23/16 108/75  09/09/16 121/61    Filed Weights   05/04/18 1054  Weight: 182 lb (82.6 kg)      Physical Exam: Constitutional: Patient appears well-developed and well-nourished. No distress. HENT: Normocephalic, atraumatic, External right and left ear normal. Oropharynx is clear and moist.  Eyes: Conjunctivae and EOM are normal. PERRLA, no scleral icterus. Neck: Normal ROM. Neck supple. No JVD. No tracheal deviation. No thyromegaly. CVS: RRR, S1/S2 +, no murmurs, no gallops, no carotid bruit.  Pulmonary: Effort and breath sounds normal, no stridor, rhonchi, wheezes, rales.  Abdominal: Soft. BS +, no distension, tenderness, rebound or guarding.  Musculoskeletal: Normal range of motion. No edema and no tenderness.  Neuro: Alert. Normal muscle tone coordination. Normal gait. BUE and BLE strength 5/5. Bilateral hand grips symmetrical. No cranial nerve deficit. Skin: Skin is warm and dry. No rash noted. Not diaphoretic. No erythema. No pallor. Psychiatric: Normal mood and affect. Behavior, judgment, thought content normal.  Lab Results (prior encounters)  Lab Results  Component Value Date   WBC 13.6 (H) 09/09/2016   HGB 11.4 (L) 09/09/2016   HCT 35.6 09/09/2016   MCV 80.5 09/09/2016   PLT 523 (H) 09/09/2016   Lab Results  Component Value Date   CREATININE 1.02 (H) 07/31/2016   BUN 8 07/31/2016   NA 140 07/31/2016   K 4.0 07/31/2016   CL 108  07/31/2016   CO2 24 07/31/2016    No results found for: HGBA1C     Component Value Date/Time   CHOL 215 (H) 04/14/2016 1526   TRIG 162 (H) 04/14/2016 1526   HDL 50 04/14/2016 1526   CHOLHDL 4.3 04/14/2016 1526   LDLCALC 133 (H) 04/14/2016 1526        Assessment and plan:  1. Encounter to establish care  2. Essential hypertension, well controlled on current regimen We have discussed target BP range and blood pressure goal. I have advised patient to check BP regularly and to call us back or report to clinic if the numbers are consistently higher than 140/90. We discussed the importance of compliance with medical therapy and DASH diet recommended, consequences of uncontrolled hypertension discussed.  -Continue current  BP medications  3. Breast cancer screening by mammogram - MM Digital Screening; Future  4. Chronic gastric ulcer, unspecified whether gastric ulcer hemorrhage or perforation present - Ambulatory referral to Gastroenterology  5. Iron deficiency anemia due to chronic blood loss -patient returning for fasting labs in 1-2 weeks. Will check an iron panel  6. GAD (generalized anxiety disorder) -continue hydroxyzine    Return 1-2 weeks fasting labs and CPE .   The patient was given clear instructions to go to ER or return to medical center if symptoms don't improve, worsen or new problems develop. The patient verbalized understanding. The patient was advised  to call and obtain lab results if they haven't heard anything from out office within 7-10 business days.  Molli Barrows, FNP Primary Care at [facility] [address] [place] [address] [phone]: [phone]    This note has been created with Dragon speech recognition software and Engineer, materials. Any transcriptional errors are unintentional.

## 2018-05-04 NOTE — Patient Instructions (Addendum)
Thank you for choosing Primary Care at [facility] to be your medical home!    Natalie Edwards was seen by Molli Barrows, FNP today.   Constance Holster Wipperfurth's primary care provider is Scot Jun, FNP.   For the best care possible, you should try to see Molli Barrows, FNP-C whenever you come to the clinic.   We look forward to seeing you again soon!  If you have any questions about your visit today, please call us at [phone] or feel free to reach your primary care provider via [place].       Generalized Anxiety Disorder, Adult Generalized anxiety disorder (GAD) is a mental health disorder. People with this condition constantly worry about everyday events. Unlike normal anxiety, worry related to GAD is not triggered by a specific event. These worries also do not fade or get better with time. GAD interferes with life functions, including relationships, work, and school. GAD can vary from mild to severe. People with severe GAD can have intense waves of anxiety with physical symptoms (panic attacks). What are the causes? The exact cause of GAD is not known. What increases the risk? This condition is more likely to develop in:  Women.  People who have a family history of anxiety disorders.  People who are very shy.  People who experience very stressful life events, such as the death of a loved one.  People who have a very stressful family environment. What are the signs or symptoms? People with GAD often worry excessively about many things in their lives, such as their health and family. They may also be overly concerned about:  Doing well at work.  Being on time.  Natural disasters.  Friendships. Physical symptoms of GAD include:  Fatigue.  Muscle tension or having muscle twitches.  Trembling or feeling shaky.  Being easily startled.  Feeling like your heart is pounding or racing.  Feeling out of breath or like you cannot take a deep breath.  Having trouble  falling asleep or staying asleep.  Sweating.  Nausea, diarrhea, or irritable bowel syndrome (IBS).  Headaches.  Trouble concentrating or remembering facts.  Restlessness.  Irritability. How is this diagnosed? Your health care provider can diagnose GAD based on your symptoms and medical history. You will also have a physical exam. The health care provider will ask specific questions about your symptoms, including how severe they are, when they started, and if they come and go. Your health care provider may ask you about your use of alcohol or drugs, including prescription medicines. Your health care provider may refer you to a mental health specialist for further evaluation. Your health care provider will do a thorough examination and may perform additional tests to rule out other possible causes of your symptoms. To be diagnosed with GAD, a person must have anxiety that:  Is out of his or her control.  Affects several different aspects of his or her life, such as work and relationships.  Causes distress that makes him or her unable to take part in normal activities.  Includes at least three physical symptoms of GAD, such as restlessness, fatigue, trouble concentrating, irritability, muscle tension, or sleep problems. Before your health care provider can confirm a diagnosis of GAD, these symptoms must be present more days than they are not, and they must last for six months or longer. How is this treated? The following therapies are usually used to treat GAD:  Medicine. Antidepressant medicine is usually prescribed for long-term daily control. Antianxiety medicines  may be added in severe cases, especially when panic attacks occur.  Talk therapy (psychotherapy). Certain types of talk therapy can be helpful in treating GAD by providing support, education, and guidance. Options include: ? Cognitive behavioral therapy (CBT). People learn coping skills and techniques to ease their anxiety.  They learn to identify unrealistic or negative thoughts and behaviors and to replace them with positive ones. ? Acceptance and commitment therapy (ACT). This treatment teaches people how to be mindful as a way to cope with unwanted thoughts and feelings. ? Biofeedback. This process trains you to manage your body's response (physiological response) through breathing techniques and relaxation methods. You will work with a therapist while machines are used to monitor your physical symptoms.  Stress management techniques. These include yoga, meditation, and exercise. A mental health specialist can help determine which treatment is best for you. Some people see improvement with one type of therapy. However, other people require a combination of therapies. Follow these instructions at home:  Take over-the-counter and prescription medicines only as told by your health care provider.  Try to maintain a normal routine.  Try to anticipate stressful situations and allow extra time to manage them.  Practice any stress management or self-calming techniques as taught by your health care provider.  Do not punish yourself for setbacks or for not making progress.  Try to recognize your accomplishments, even if they are small.  Keep all follow-up visits as told by your health care provider. This is important. Contact a health care provider if:  Your symptoms do not get better.  Your symptoms get worse.  You have signs of depression, such as: ? A persistently sad, cranky, or irritable mood. ? Loss of enjoyment in activities that used to bring you joy. ? Change in weight or eating. ? Changes in sleeping habits. ? Avoiding friends or family members. ? Loss of energy for normal tasks. ? Feelings of guilt or worthlessness. Get help right away if:  You have serious thoughts about hurting yourself or others. If you ever feel like you may hurt yourself or others, or have thoughts about taking your own  life, get help right away. You can go to your nearest emergency department or call:  Your local emergency services (911 in the [place]  A suicide crisis helpline, such as the [place] at 239 844 8133. This is open 24 hours a day. Summary  Generalized anxiety disorder (GAD) is a mental health disorder that involves worry that is not triggered by a specific event.  People with GAD often worry excessively about many things in their lives, such as their health and family.  GAD may cause physical symptoms such as restlessness, trouble concentrating, sleep problems, frequent sweating, nausea, diarrhea, headaches, and trembling or muscle twitching.  A mental health specialist can help determine which treatment is best for you. Some people see improvement with one type of therapy. However, other people require a combination of therapies. This information is not intended to replace advice given to you by your health care provider. Make sure you discuss any questions you have with your health care provider. Document Released: 06/28/2012 Document Revised: 01/22/2016 Document Reviewed: 01/22/2016 Elsevier Interactive Patient Education  2019 [employer].

## 2018-05-07 DIAGNOSIS — I10 Essential (primary) hypertension: Secondary | ICD-10-CM | POA: Insufficient documentation

## 2018-05-07 DIAGNOSIS — F411 Generalized anxiety disorder: Secondary | ICD-10-CM | POA: Insufficient documentation

## 2018-05-07 DIAGNOSIS — K257 Chronic gastric ulcer without hemorrhage or perforation: Secondary | ICD-10-CM | POA: Insufficient documentation

## 2018-05-10 ENCOUNTER — Ambulatory Visit (INDEPENDENT_AMBULATORY_CARE_PROVIDER_SITE_OTHER): Payer: Medicare Other | Admitting: Gastroenterology

## 2018-05-10 ENCOUNTER — Encounter: Payer: Self-pay | Admitting: Gastroenterology

## 2018-05-10 VITALS — BP 90/68 | HR 100 | Ht 63.0 in | Wt 181.2 lb

## 2018-05-10 DIAGNOSIS — D509 Iron deficiency anemia, unspecified: Secondary | ICD-10-CM | POA: Diagnosis not present

## 2018-05-10 DIAGNOSIS — R1013 Epigastric pain: Secondary | ICD-10-CM | POA: Diagnosis not present

## 2018-05-10 MED ORDER — ESOMEPRAZOLE MAGNESIUM 40 MG PO CPDR: 40.0000 mg | DELAYED_RELEASE_CAPSULE | Freq: Two times a day (BID) | ORAL | 3 refills | Status: DC

## 2018-05-10 MED ORDER — SUCRALFATE 1 G PO TABS: 1.0000 g | ORAL_TABLET | Freq: Four times a day (QID) | ORAL | 3 refills | Status: DC

## 2018-05-10 MED ORDER — FAMOTIDINE 20 MG PO TABS: 20.0000 mg | ORAL_TABLET | Freq: Two times a day (BID) | ORAL | 3 refills | Status: DC

## 2018-05-10 NOTE — Progress Notes (Signed)
Referring Provider: Scot Jun, FNP Primary Care Physician:  Scot Jun, FNP   Reason for Consultation: Chronic gastric ulcer, abdominal pain   IMPRESSION:  Epigastric pain not responding to Nexium BID-QID Abnormal CT scan 07/31/2016 showing gastric antral thickening    -EGD performed at [facility] at that time Iron deficiency anemia History of NSAID related peptic ulcer disease 2015 in [place], [place] Screening colonoscopy with [facility] 2018 No known family history of colon cancer or polyps   Etiology of epigastric pain not responding to Nexium is unclear. Nocturnal symptoms are unusual. No alarm features.   She is convinced that it is recurrent peptic ulcer disease despite avoiding all NSAIDs for almost 5 years.  The differential includes erosive esophagitis, H. pylori, peptic ulcer disease due to other etiologies, and most likely nonulcer dyspepsia.    PLAN: CBC, iron, ferritin (if not ordered through primary care) Nexium 40 mg BID Add Carafate 1g slurry QID Pepcid 20 mg BID PRN breakthrough pain EGD Obtain office notes, EGD and colonoscopy records, and pathology records from [place]   HPI: Natalie Edwards is a 53 y.o. female seen in consultation at the request of NP Harris for further evaluation of chronic gastric ulcer.  The history is obtained to the patient and review of her electronic health record.  No additional records are available in [place].  She has hypertension and iron deficiency anemia.  She is previously seen hematology for leukocytosis and thrombocytosis.  Distant history of EGD-diagnosed NSAID-related PUD 2015 while living in [place]. Although she believes that stress may be related. [facility] in [place], [place]. Started daily PPI therapy and stopped using all NSAIDs at that time. Now using Tylenol for pain.    Seen in the ED for abdominal pain 07/31/16.  I have personally reviewed the images of  a CT of the abdomen and pelvis  with contrast 07/31/2016 obtained at that time for left upper quadrant pain.  The wall of the gastric antrum appeared thickened, however, the stomach is not well distended.  She has diverticulosis predominantly in the sigmoid.  The radiologist recommended upper endoscopy. She had an EGD and colonoscopy with a gastroenterologist at [facility] in 2018. She thought she was supposed to have a follow-up exam. She has now changed her primary PCP.   Labs from 09/09/2016 show iron 24, ferritin 23, percent saturation 7.  Hemoglobin 11.4, RDW 16.8, MCV 80.  Her primary care provider recently ordered fasting iron studies.  These have not yet been performed.  Currently taking Nexium 1-4 times daily for non-radiating, LUQ abdominal pain, although the Nexium is prescribed BID. Pain wakes her from sleep. Similar to ulcer pain that she has had in the past. Getting worse with time. Weight gain. Appetite fluctuates.  No dysphasia, odynophagia, or heartburn.  No nausea. No melena, hematochezia, BRBPR, or change in bowel habits.  No other associated symptoms. No identified exacerbating or relieving features.  She is concerned about recurrent ulcer disease.  Past Medical History:  Diagnosis Date  . Allergy   . Anemia   . Gall bladder stones 1993  . Gastric ulcer   . GERD (gastroesophageal reflux disease)   . Hypertension     Past Surgical History:  Procedure Laterality Date  . ABDOMINAL HYSTERECTOMY      Current Outpatient Medications  Medication Sig Dispense Refill  . amLODipine-benazepril (LOTREL) 10-40 MG capsule Take 1 capsule by mouth daily for 30 days. 30 capsule 11  . esomeprazole (NEXIUM) 40  MG capsule Take 1 capsule (40 mg total) by mouth daily. 30 capsule 0  . ferrous sulfate 325 (65 FE) MG EC tablet Take 1 tablet (325 mg total) by mouth 3 (three) times daily with meals. 90 tablet 3  . hydrochlorothiazide (HYDRODIURIL) 25 MG tablet Take 1 tablet (25 mg total) by mouth daily for 30 days. 30 tablet  11  . hydrOXYzine (ATARAX/VISTARIL) 25 MG tablet Take 1 tablet (25 mg total) by mouth 3 (three) times daily as needed for anxiety. 90 tablet 2  . zolpidem (AMBIEN) 10 MG tablet Take 1 tablet (10 mg total) by mouth at bedtime as needed. for sleep 30 tablet 1   No current facility-administered medications for this visit.     Allergies as of 05/10/2018  . (No Known Allergies)    Family History  Problem Relation Age of Onset  . Cancer Maternal Uncle        Lung  . Cancer Maternal Uncle        Lung  . Cancer Maternal Uncle        Lung    Social History   Socioeconomic History  . Marital status: Single    Spouse name: Not on file  . Number of children: Not on file  . Years of education: Not on file  . Highest education level: Not on file  Occupational History  . Not on file  Social Needs  . Financial resource strain: Not on file  . Food insecurity:    Worry: Not on file    Inability: Not on file  . Transportation needs:    Medical: Not on file    Non-medical: Not on file  Tobacco Use  . Smoking status: Current Every Day Smoker    Packs/day: 0.25    Years: 25.00    Pack years: 6.25    Types: Cigarettes  . Smokeless tobacco: Never Used  . Tobacco comment: Pt tried to quit - helps with anxiety   Substance and Sexual Activity  . Alcohol use: Yes    Comment: occasional  . Drug use: No  . Sexual activity: Yes    Comment: hysterectomy  Lifestyle  . Physical activity:    Days per week: Not on file    Minutes per session: Not on file  . Stress: Not on file  Relationships  . Social connections:    Talks on phone: Not on file    Gets together: Not on file    Attends religious service: Not on file    Active member of club or organization: Not on file    Attends meetings of clubs or organizations: Not on file    Relationship status: Not on file  . Intimate partner violence:    Fear of current or ex partner: Not on file    Emotionally abused: Not on file    Physically  abused: Not on file    Forced sexual activity: Not on file  Other Topics Concern  . Not on file  Social History Narrative  . Not on file    Review of Systems: 12 system ROS is negative except as noted above.  Filed Weights   05/10/18 0916  Weight: 181 lb 4 oz (82.2 kg)    Physical Exam: Vital signs were reviewed. General:   Alert, well-nourished, pleasant and cooperative in NAD Head:  Normocephalic and atraumatic. Eyes:  Sclera clear, no icterus.   Conjunctiva pink. Mouth:  No deformity or lesions.   Neck:  Supple; no thyromegaly.  Lungs:  Clear throughout to auscultation.   No wheezes.  Heart:  Regular rate and rhythm; no murmurs Abdomen:  Soft, nontender, normal bowel sounds. No rebound or guarding. No hepatosplenomegaly Rectal:  Deferred  Msk:  Symmetrical without gross deformities. Extremities:  No gross deformities or edema. Neurologic:  Alert and  oriented x4;  grossly nonfocal Skin:  No rash or bruise. Psych:  Alert and cooperative. Normal mood and affect.   Bradan Congrove L. Tarri Glenn, MD, MPH [facility] 05/10/2018, 9:26 AM

## 2018-05-10 NOTE — Patient Instructions (Signed)
-   Continue to take Nexium 40 mg twice daily - Add Carafate 1g slurry four times daily - Use Pepcid 20 mg twice daily as needed for breakthrough pain - Schedule an EGD at your convenience - I will obtain your prior records from [place] - Please have your iron levels checked as ordered by your primary care provider

## 2018-05-31 ENCOUNTER — Ambulatory Visit (INDEPENDENT_AMBULATORY_CARE_PROVIDER_SITE_OTHER): Payer: Medicare Other | Admitting: Family Medicine

## 2018-05-31 ENCOUNTER — Encounter: Payer: Self-pay | Admitting: Family Medicine

## 2018-05-31 ENCOUNTER — Other Ambulatory Visit: Payer: Self-pay

## 2018-05-31 VITALS — BP 104/68 | HR 90 | Resp 17 | Ht 63.0 in | Wt 177.4 lb

## 2018-05-31 DIAGNOSIS — Z1329 Encounter for screening for other suspected endocrine disorder: Secondary | ICD-10-CM

## 2018-05-31 DIAGNOSIS — F17210 Nicotine dependence, cigarettes, uncomplicated: Secondary | ICD-10-CM

## 2018-05-31 DIAGNOSIS — Z0001 Encounter for general adult medical examination with abnormal findings: Secondary | ICD-10-CM

## 2018-05-31 DIAGNOSIS — K279 Peptic ulcer, site unspecified, unspecified as acute or chronic, without hemorrhage or perforation: Secondary | ICD-10-CM | POA: Diagnosis not present

## 2018-05-31 DIAGNOSIS — Z6831 Body mass index (BMI) 31.0-31.9, adult: Secondary | ICD-10-CM

## 2018-05-31 DIAGNOSIS — E669 Obesity, unspecified: Secondary | ICD-10-CM | POA: Diagnosis not present

## 2018-05-31 DIAGNOSIS — I10 Essential (primary) hypertension: Secondary | ICD-10-CM | POA: Diagnosis not present

## 2018-05-31 DIAGNOSIS — Z114 Encounter for screening for human immunodeficiency virus [HIV]: Secondary | ICD-10-CM

## 2018-05-31 DIAGNOSIS — Z131 Encounter for screening for diabetes mellitus: Secondary | ICD-10-CM | POA: Diagnosis not present

## 2018-05-31 DIAGNOSIS — R7309 Other abnormal glucose: Secondary | ICD-10-CM

## 2018-05-31 DIAGNOSIS — Z0000 Encounter for general adult medical examination without abnormal findings: Secondary | ICD-10-CM

## 2018-05-31 NOTE — Progress Notes (Signed)
Patient ID: Natalie Edwards, female    DOB: 03-22-1965, 53 y.o.   MRN: [id number]  PCP: Scot Jun, FNP  No chief complaint on file.   Subjective:  HPI Annual Physical  Natalie Edwards chronic conditions include:Thrombocytosis ([place]); Leukocytosis; History of hypertension; H/O ulcer disease; Iron deficiency anemia due to chronic blood loss; Peptic ulcer disease with hemorrhage; GAD (generalized anxiety disorder); Chronic gastric ulcer; and Essential hypertension.  Natalie Edwards is a 53 y.o. female, smoker presents for complete physical exam. She endorses she has decreased smoking to maybe 3 cigarette or less per day. Smoking is her way of dealing with stress she admits. 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 She is now working full-time. She has an attendant who helps with her mother. Minimal time for physical activity.   Health Promotion:  Routine physical activity: none at present   Current DPO:EUMP mass index is 31.42 kg/m.   Dietary habits: non-restrictive   Gynecological history  Hysterectomy 2007 History of fibroid   Health Screening Current/Overdue:   Follows GI and there are obtaining prior records of last colonoscopy. Scheduled 06/07/18 Last Dental Exam:no routine visit  Last Eye Exam: No routine      Current home medications include: Prior to Admission medications   Medication Sig Start Date End Date Taking? Authorizing Provider  amLODipine-benazepril (LOTREL) 10-40 MG capsule Take 1 capsule by mouth daily for 30 days. 05/04/18 06/03/18 Yes Scot Jun, FNP  esomeprazole (NEXIUM) 40 MG capsule Take 1 capsule (40 mg total) by mouth 2 (two) times daily before a meal. 05/10/18  Yes Thornton Park, MD  famotidine (PEPCID) 20 MG tablet Take 1 tablet (20 mg total) by mouth 2 (two) times daily. 05/10/18  Yes Thornton Park, MD  ferrous sulfate 325 (65 FE) MG EC tablet Take 1  tablet (325 mg total) by mouth 3 (three) times daily with meals. 09/23/16  Yes Perlov, Marinell Blight, MD  hydrochlorothiazide (HYDRODIURIL) 25 MG tablet Take 1 tablet (25 mg total) by mouth daily for 30 days. 05/04/18 06/03/18 Yes Scot Jun, FNP  hydrOXYzine (ATARAX/VISTARIL) 25 MG tablet Take 1 tablet (25 mg total) by mouth 3 (three) times daily as needed for anxiety. 05/04/18  Yes Scot Jun, FNP  sucralfate (CARAFATE) 1 g tablet Take 1 tablet (1 g total) by mouth 4 (four) times daily. 05/10/18  Yes Thornton Park, MD  zolpidem (AMBIEN) 10 MG tablet Take 1 tablet (10 mg total) by mouth at bedtime as needed. for sleep 05/04/18  Yes Scot Jun, FNP    Family History  Problem Relation Age of Onset  . Cancer Maternal Uncle        Lung  . Cancer Maternal Uncle        Lung  . Cancer Maternal Uncle        Lung    No Known Allergies  Social History   Socioeconomic History  . Marital status: Single    Spouse name: Not on file  . Number of children: 3  . Years of education: Not on file  . Highest education level: Not on file  Occupational History  . Not on file  Social Needs  . Financial resource strain: Not on file  . Food insecurity:    Worry: Not on file    Inability: Not on file  . Transportation needs:    Medical: Not on file    Non-medical: Not  on file  Tobacco Use  . Smoking status: Current Every Day Smoker    Packs/day: 0.25    Years: 25.00    Pack years: 6.25    Types: Cigarettes  . Smokeless tobacco: Never Used  . Tobacco comment: Pt tried to quit - helps with anxiety   Substance and Sexual Activity  . Alcohol use: Yes    Comment: occasional  . Drug use: No  . Sexual activity: Yes    Comment: hysterectomy  Lifestyle  . Physical activity:    Days per week: Not on file    Minutes per session: Not on file  . Stress: Not on file  Relationships  . Social connections:    Talks on phone: Not on file    Gets together: Not on file    Attends  religious service: Not on file    Active member of club or organization: Not on file    Attends meetings of clubs or organizations: Not on file    Relationship status: Not on file  . Intimate partner violence:    Fear of current or ex partner: Not on file    Emotionally abused: Not on file    Physically abused: Not on file    Forced sexual activity: Not on file  Other Topics Concern  . Not on file  Social History Narrative  . Not on file   Review of Systems Pertinent negatives listed in HPI Past Medical, Surgical Family and Social History reviewed and updated.  Objective:   Today's Vitals   05/31/18 1011  BP: 104/68  Pulse: 90  Resp: 17  SpO2: 96%  Weight: 177 lb 6.4 oz (80.5 kg)  Height: 5\' 3"  (1.6 m)    Wt Readings from Last 3 Encounters:  05/31/18 177 lb 6.4 oz (80.5 kg)  05/10/18 181 lb 4 oz (82.2 kg)  05/04/18 182 lb (82.6 kg)    Physical Exam Physical Exam: Constitutional: Patient appears well-developed and well-nourished. No distress. HENT: Normocephalic, atraumatic, External right and left ear normal. Oropharynx is clear and moist.  Eyes: Conjunctivae and EOM are normal. PERRLA, no scleral icterus. Neck: Normal ROM. Neck supple. No JVD. No tracheal deviation. No thyromegaly. CVS: RRR, S1/S2 +, no murmurs, no gallops, no carotid bruit.  Pulmonary: Effort and breath sounds normal, no stridor, rhonchi, wheezes, rales.  Abdominal: Soft. BS +, no distension, tenderness, rebound or guarding.  Musculoskeletal: Normal range of motion. No edema and no tenderness.  Lymphadenopathy: No lymphadenopathy noted, cervical, inguinal or axillary Neuro: Alert. Normal reflexes, muscle tone coordination. No cranial nerve deficit. Skin: Skin is warm and dry. No rash noted. Not diaphoretic. No erythema. No pallor. Psychiatric: Normal mood and affect. Behavior, judgment, thought content normal.    Assessment & Plan:  1. PUD (peptic ulcer disease) -Followed by Gastroenterology   Will check labs to rule out anemia: - Iron, TIBC and Ferritin Panel - CBC with Differential  2. Screening for diabetes mellitus Check A1C  3. Elevated glucose - Comprehensive metabolic panel - Hemoglobin A1c  4. Thyroid disorder screen - Thyroid Panel With TSH  5. Essential hypertension Blood pressure has remained very low during recent visits. Will discontinue HCTZ Checking  Lipid panel  6. Annual physical exam Age-appropriate anticipatory guidance provided    7. Screening for HIV (human immunodeficiency virus) - HIV antibody (with reflex)   Orders Placed This Encounter  Procedures  . Lipid panel    Order Specific Question:   Has the patient fasted?  Answer:   No  . Comprehensive metabolic panel    Order Specific Question:   Has the patient fasted?    Answer:   No  . Iron, TIBC and Ferritin Panel  . Hemoglobin A1c  . CBC with Differential  . Thyroid Panel With TSH  . HIV antibody (with reflex)     Molli Barrows, FNP Primary Care at [facility] [address] [place] [phone]: [phone]

## 2018-05-31 NOTE — Patient Instructions (Addendum)

## 2018-06-01 LAB — LIPID PANEL
CHOLESTEROL TOTAL: 184 mg/dL (ref 100–199)
Chol/HDL Ratio: 3.3 ratio (ref 0.0–4.4)
HDL: 55 mg/dL (ref >39)
LDL CALC: 104 mg/dL — AB (ref 0–99)
TRIGLYCERIDES: 123 mg/dL (ref 0–149)
VLDL Cholesterol Cal: 25 mg/dL (ref 5–40)

## 2018-06-01 LAB — CBC WITH DIFFERENTIAL/PLATELET
Basophils Absolute: 0.1 10*3/uL (ref 0.0–0.2)
Basos: 0 %
EOS (ABSOLUTE): 0.3 10*3/uL (ref 0.0–0.4)
Eos: 2 %
Hematocrit: 38.4 % (ref 34.0–46.6)
Hemoglobin: 12.5 g/dL (ref 11.1–15.9)
IMMATURE GRANS (ABS): 0 10*3/uL (ref 0.0–0.1)
Immature Granulocytes: 0 %
Lymphocytes Absolute: 4.9 10*3/uL — ABNORMAL HIGH (ref 0.7–3.1)
Lymphs: 36 %
MCH: 28 pg (ref 26.6–33.0)
MCHC: 32.6 g/dL (ref 31.5–35.7)
MCV: 86 fL (ref 79–97)
Monocytes Absolute: 0.9 10*3/uL (ref 0.1–0.9)
Monocytes: 7 %
Neutrophils Absolute: 7.5 10*3/uL — ABNORMAL HIGH (ref 1.4–7.0)
Neutrophils: 55 %
PLATELETS: 570 10*3/uL — AB (ref 150–450)
RBC: 4.46 x10E6/uL (ref 3.77–5.28)
RDW: 15.4 % (ref 11.7–15.4)
WBC: 13.6 10*3/uL — ABNORMAL HIGH (ref 3.4–10.8)

## 2018-06-01 LAB — THYROID PANEL WITH TSH
FREE THYROXINE INDEX: 1.9 (ref 1.2–4.9)
T3 Uptake Ratio: 25 % (ref 24–39)
T4, Total: 7.4 ug/dL (ref 4.5–12.0)
TSH: 0.782 u[IU]/mL (ref 0.450–4.500)

## 2018-06-01 LAB — IRON,TIBC AND FERRITIN PANEL
Ferritin: 53 ng/mL (ref 15–150)
Iron Saturation: 11 % — ABNORMAL LOW (ref 15–55)
Iron: 37 ug/dL (ref 27–159)
Total Iron Binding Capacity: 330 ug/dL (ref 250–450)
UIBC: 293 ug/dL (ref 131–425)

## 2018-06-01 LAB — COMPREHENSIVE METABOLIC PANEL
ALK PHOS: 68 IU/L (ref 39–117)
ALT: 15 IU/L (ref 0–32)
AST: 11 IU/L (ref 0–40)
Albumin/Globulin Ratio: 1.7 (ref 1.2–2.2)
Albumin: 4.9 g/dL (ref 3.8–4.9)
BUN/Creatinine Ratio: 17 (ref 9–23)
BUN: 20 mg/dL (ref 6–24)
Bilirubin Total: <0.2 mg/dL (ref 0.0–1.2)
CO2: 19 mmol/L — ABNORMAL LOW (ref 20–29)
Calcium: 10 mg/dL (ref 8.7–10.2)
Chloride: 107 mmol/L — ABNORMAL HIGH (ref 96–106)
Creatinine, Ser: 1.21 mg/dL — ABNORMAL HIGH (ref 0.57–1.00)
GFR calc Af Amer: 59 mL/min/{1.73_m2} — ABNORMAL LOW (ref >59)
GFR calc non Af Amer: 51 mL/min/{1.73_m2} — ABNORMAL LOW (ref >59)
Globulin, Total: 2.9 g/dL (ref 1.5–4.5)
Glucose: 90 mg/dL (ref 65–99)
POTASSIUM: 4.6 mmol/L (ref 3.5–5.2)
Sodium: 143 mmol/L (ref 134–144)
Total Protein: 7.8 g/dL (ref 6.0–8.5)

## 2018-06-01 LAB — HIV ANTIBODY (ROUTINE TESTING W REFLEX): HIV Screen 4th Generation wRfx: NONREACTIVE

## 2018-06-01 LAB — HEMOGLOBIN A1C
Est. average glucose Bld gHb Est-mCnc: 117 mg/dL
Hgb A1c MFr Bld: 5.7 % — ABNORMAL HIGH (ref 4.8–5.6)

## 2018-06-03 ENCOUNTER — Telehealth: Payer: Self-pay | Admitting: Family Medicine

## 2018-06-03 ENCOUNTER — Encounter (HOSPITAL_COMMUNITY): Payer: Self-pay

## 2018-06-03 ENCOUNTER — Emergency Department (HOSPITAL_COMMUNITY): Payer: Medicare Other

## 2018-06-03 ENCOUNTER — Emergency Department (HOSPITAL_COMMUNITY)
Admission: EM | Admit: 2018-06-03 | Discharge: 2018-06-03 | Disposition: A | Discharge status: Home / Self Care | Payer: Medicare Other | Attending: Emergency Medicine | Admitting: Emergency Medicine

## 2018-06-03 DIAGNOSIS — J0120 Acute ethmoidal sinusitis, unspecified: Secondary | ICD-10-CM | POA: Diagnosis not present

## 2018-06-03 DIAGNOSIS — R51 Headache: Secondary | ICD-10-CM

## 2018-06-03 DIAGNOSIS — Z79899 Other long term (current) drug therapy: Secondary | ICD-10-CM | POA: Insufficient documentation

## 2018-06-03 DIAGNOSIS — R519 Headache, unspecified: Secondary | ICD-10-CM

## 2018-06-03 DIAGNOSIS — R42 Dizziness and giddiness: Secondary | ICD-10-CM

## 2018-06-03 DIAGNOSIS — I10 Essential (primary) hypertension: Secondary | ICD-10-CM | POA: Diagnosis not present

## 2018-06-03 DIAGNOSIS — F17210 Nicotine dependence, cigarettes, uncomplicated: Secondary | ICD-10-CM | POA: Diagnosis not present

## 2018-06-03 LAB — BASIC METABOLIC PANEL
Anion gap: 10 (ref 5–15)
BUN: 16 mg/dL (ref 6–20)
CO2: 20 mmol/L — ABNORMAL LOW (ref 22–32)
Calcium: 9.8 mg/dL (ref 8.9–10.3)
Chloride: 108 mmol/L (ref 98–111)
Creatinine, Ser: 1.25 mg/dL — ABNORMAL HIGH (ref 0.44–1.00)
GFR calc Af Amer: 57 mL/min — ABNORMAL LOW (ref >60)
GFR calc non Af Amer: 49 mL/min — ABNORMAL LOW (ref >60)
Glucose, Bld: 98 mg/dL (ref 70–99)
Potassium: 3.7 mmol/L (ref 3.5–5.1)
Sodium: 138 mmol/L (ref 135–145)

## 2018-06-03 LAB — URINALYSIS, ROUTINE W REFLEX MICROSCOPIC
Bilirubin Urine: NEGATIVE
Glucose, UA: NEGATIVE mg/dL
Hgb urine dipstick: NEGATIVE
Ketones, ur: NEGATIVE mg/dL
NITRITE: NEGATIVE
PH: 5 (ref 5.0–8.0)
Protein, ur: NEGATIVE mg/dL
Specific Gravity, Urine: 1.015 (ref 1.005–1.030)

## 2018-06-03 LAB — CBC
HCT: 38.2 % (ref 36.0–46.0)
HEMOGLOBIN: 12.4 g/dL (ref 12.0–15.0)
MCH: 28.4 pg (ref 26.0–34.0)
MCHC: 32.5 g/dL (ref 30.0–36.0)
MCV: 87.4 fL (ref 80.0–100.0)
Platelets: 557 10*3/uL — ABNORMAL HIGH (ref 150–400)
RBC: 4.37 MIL/uL (ref 3.87–5.11)
RDW: 16.4 % — ABNORMAL HIGH (ref 11.5–15.5)
WBC: 15.9 10*3/uL — ABNORMAL HIGH (ref 4.0–10.5)
nRBC: 0 % (ref 0.0–0.2)

## 2018-06-03 LAB — LIPASE, BLOOD: Lipase: 23 U/L (ref 11–51)

## 2018-06-03 LAB — TROPONIN I: Troponin I: <0.03 ng/mL (ref <0.03)

## 2018-06-03 MED ORDER — LORATADINE-PSEUDOEPHEDRINE ER 10-240 MG PO TB24: 1.0000 | ORAL_TABLET | Freq: Every day | ORAL | 0 refills | Status: DC

## 2018-06-03 MED ORDER — AMOXICILLIN-POT CLAVULANATE 875-125 MG PO TABS: 1.0000 | ORAL_TABLET | Freq: Two times a day (BID) | ORAL | 0 refills | Status: DC

## 2018-06-03 NOTE — Telephone Encounter (Signed)
Called patient and scheduled appointment.

## 2018-06-03 NOTE — Telephone Encounter (Signed)
Patient will have to schedule an appointment ( Monday at 10:30)  and bring in her Ambien bottle. We will discuss medication changes at that time. These medications are controlled and are within the same drug class. Since she is experiencing a reaction or intolerance of medication, I will need to see her face to [facility].

## 2018-06-03 NOTE — ED Triage Notes (Signed)
Patient c/o abdominal pain, nausea, and vomiting since yesterday. Reports a history of abdominal ulcers. Patient also c/o chest, dizziness, and blurry vision. But states "I think my chest pain is from stress". Denies fever, cough, or dysuria.

## 2018-06-03 NOTE — Telephone Encounter (Signed)
Patient came in requesting a change in medication for the Ambien, patient states that the Lorrin Mais is making her jittery and nauseas. Patient stated that she is taking it as directed. Patient would like to try tetrazepam. Please follow up.

## 2018-06-03 NOTE — ED Provider Notes (Signed)
[facility] EMERGENCY DEPARTMENT Provider Note   CSN: [id number] Arrival date & time: 06/03/18  7619    History   Chief Complaint Chief Complaint  Patient presents with  . Abdominal Pain    HPI Natalie Edwards is a 53 y.o. female.     HPI Patient reports she has been having problems with headaches and dizziness.  This is been episodic for several months.  She reports she gets several headaches per week.  She reports she gets a generalized aching throbbing quality headache.  She takes Tylenol for it and often it is helpful.  She reports lately however is been less helpful.  She reports she also gets episodes of feeling like she is dizzy and lightheaded particularly with position change or standing.  No nausea no vomiting.  No visual changes.  No double vision or loss of vision.  No chest pain or shortness of breath.  Patient reports she chronically has some discomfort in her abdomen due to history of ulcers.  She denies any recent vomiting or blood.  She reports recently her blood pressures were running a little low so her family doctor discontinued her hydrochlorothiazide.  Denies any lower extremity swelling or calf pain. Past Medical History:  Diagnosis Date  . Allergy   . Anemia   . Diverticulosis   . Gall bladder stones 1993  . Gastric ulcer   . GERD (gastroesophageal reflux disease)   . Hypertension   . Renal mass     Patient Active Problem List   Diagnosis Date Noted  . GAD (generalized anxiety disorder) 05/07/2018  . Chronic gastric ulcer 05/07/2018  . Essential hypertension 05/07/2018  . Iron deficiency anemia due to chronic blood loss 09/23/2016  . Peptic ulcer disease with hemorrhage 09/23/2016  . Thrombocytosis ([place]) 09/09/2016  . Leukocytosis 09/09/2016  . History of hypertension 07/31/2016  . H/O ulcer disease 07/31/2016    Past Surgical History:  Procedure Laterality Date  . ABDOMINAL HYSTERECTOMY    . CHOLECYSTECTOMY       OB History    No obstetric history on file.      Home Medications    Prior to Admission medications   Medication Sig Start Date End Date Taking? Authorizing Provider  amLODipine-benazepril (LOTREL) 10-40 MG capsule Take 1 capsule by mouth daily for 30 days. 05/04/18 06/03/18 Yes Scot Jun, FNP  esomeprazole (NEXIUM) 40 MG capsule Take 1 capsule (40 mg total) by mouth 2 (two) times daily before a meal. 05/10/18  Yes Thornton Park, MD  famotidine (PEPCID) 20 MG tablet Take 1 tablet (20 mg total) by mouth 2 (two) times daily. 05/10/18  Yes Thornton Park, MD  ferrous sulfate 325 (65 FE) MG EC tablet Take 1 tablet (325 mg total) by mouth 3 (three) times daily with meals. 09/23/16  Yes Perlov, Marinell Blight, MD  sucralfate (CARAFATE) 1 g tablet Take 1 tablet (1 g total) by mouth 4 (four) times daily. 05/10/18  Yes Thornton Park, MD  zolpidem (AMBIEN) 10 MG tablet Take 1 tablet (10 mg total) by mouth at bedtime as needed. for sleep 05/04/18  Yes Scot Jun, FNP  amoxicillin-clavulanate (AUGMENTIN) 875-125 MG tablet Take 1 tablet by mouth 2 (two) times daily. One po bid x 7 days 06/03/18   Charlesetta Shanks, MD  loratadine-pseudoephedrine (CLARITIN-D 24 HOUR) 10-240 MG 24 hr tablet Take 1 tablet by mouth daily. 06/03/18   Charlesetta Shanks, MD    Family History Family History  Problem Relation Age of  Onset  . Cancer Maternal Uncle        Lung  . Cancer Maternal Uncle        Lung  . Cancer Maternal Uncle        Lung    Social History Social History   Tobacco Use  . Smoking status: Current Every Day Smoker    Packs/day: 0.25    Years: 25.00    Pack years: 6.25    Types: Cigarettes  . Smokeless tobacco: Never Used  . Tobacco comment: Pt tried to quit - helps with anxiety   Substance Use Topics  . Alcohol use: Yes    Comment: occasional  . Drug use: No     Allergies   Patient has no known allergies.   Review of Systems Review of Systems 10 Systems reviewed and are negative  for acute change except as noted in the HPI.   Physical Exam Updated Vital Signs BP 111/62   Pulse 77   Temp 98.3 F (36.8 C) (Oral)   Resp 14   Ht 5\' 3"  (1.6 m)   Wt 81.2 kg   SpO2 97%   BMI 31.71 kg/m   Physical Exam Constitutional:      Appearance: She is well-developed.     Comments: Clinically well-appearing and well-nourished well-developed.  No distress.  HENT:     Head: Normocephalic and atraumatic.     Comments: Patient endorses slight tenderness to percussion over the frontal sinuses.    Right Ear: Tympanic membrane normal.     Left Ear: Tympanic membrane normal.     Nose: Nose normal.     Mouth/Throat:     Mouth: Mucous membranes are moist.     Pharynx: Oropharynx is clear.     Comments: Posterior oropharynx widely patent.  Dentition in very good condition.  No facial swelling. Eyes:     Extraocular Movements: Extraocular movements intact.     Conjunctiva/sclera: Conjunctivae normal.     Pupils: Pupils are equal, round, and reactive to light.  Neck:     Musculoskeletal: Neck supple.  Cardiovascular:     Rate and Rhythm: Normal rate and regular rhythm.     Heart sounds: Normal heart sounds.  Pulmonary:     Effort: Pulmonary effort is normal.     Breath sounds: Normal breath sounds.  Abdominal:     General: Bowel sounds are normal. There is no distension.     Palpations: Abdomen is soft.     Tenderness: There is no abdominal tenderness.  Musculoskeletal: Normal range of motion.  Skin:    General: Skin is warm and dry.  Neurological:     General: No focal deficit present.     Mental Status: She is alert and oriented to person, place, and time.     GCS: GCS eye subscore is 4. GCS verbal subscore is 5. GCS motor subscore is 6.     Motor: No weakness.     Coordination: Coordination normal.     Comments: Normal cognitive function.  Normal speech.  Cranial nerves intact.  No pronator drift.  Normal finger-nose examination.  Normal heel shin exam.  Motor  strength 5\5.  Psychiatric:        Mood and Affect: Mood normal.      ED Treatments / Results  Labs (all labs ordered are listed, but only abnormal results are displayed) Labs Reviewed  CBC - Abnormal; Notable for the following components:      Result Value   WBC  15.9 (*)    RDW 16.4 (*)    Platelets 557 (*)    All other components within normal limits  BASIC METABOLIC PANEL - Abnormal; Notable for the following components:   CO2 20 (*)    Creatinine, Ser 1.25 (*)    GFR calc non Af Amer 49 (*)    GFR calc Af Amer 57 (*)    All other components within normal limits  LIPASE, BLOOD  TROPONIN I  URINALYSIS, ROUTINE W REFLEX MICROSCOPIC    EKG None  Radiology Ct Head Wo Contrast  Result Date: 06/03/2018 CLINICAL DATA:  Chronic headaches EXAM: CT HEAD WITHOUT CONTRAST TECHNIQUE: Contiguous axial images were obtained from the base of the skull through the vertex without intravenous contrast. COMPARISON:  None. FINDINGS: Brain: No evidence of acute infarction, hemorrhage, hydrocephalus, extra-axial collection or mass lesion/mass effect. Vascular: No hyperdense vessel or unexpected calcification. Skull: Normal. Negative for fracture or focal lesion. Sinuses/Orbits: Mucosal thickening is noted within the ethmoid sinuses. Mucosal retention cysts are noted within the right maxillary antrum. Other: None IMPRESSION: Mild sinus disease without acute intracranial abnormality. Electronically Signed   By: Inez Catalina M.D.   On: 06/03/2018 07:41    Procedures Procedures (including critical care time)  Medications Ordered in ED Medications - No data to display   Initial Impression / Assessment and Plan / ED Course  I have reviewed the triage vital signs and the nursing notes.  Pertinent labs & imaging results that were available during my care of the patient were reviewed by me and considered in my medical decision making (see chart for details).       Patient has had episodic  headaches increasing in frequency over several months duration.  Headaches are generalized without associated neurologic dysfunction.  Patient has subjective dizziness.  CT shows some ethmoid sinusitis.  Will opt to treat in light of the fact that patient is symptomatic with headache and dizziness.  Patient's blood pressures are well controlled.  No signs of hypertensive urgency.  No localizing signs to suggest stroke symptoms.  Commend initiating treatment and follow-up with PCP.  Patient advised if symptoms are not resolved by treatment to discuss possible referral to neurology.  Return precautions reviewed.  Final Clinical Impressions(s) / ED Diagnoses   Final diagnoses:  Subacute ethmoidal sinusitis  Nonintractable episodic headache, unspecified headache type  Dizzy    ED Discharge Orders         Ordered    amoxicillin-clavulanate (AUGMENTIN) 875-125 MG tablet  2 times daily     06/03/18 1005    loratadine-pseudoephedrine (CLARITIN-D 24 HOUR) 10-240 MG 24 hr tablet  Daily     06/03/18 1005           Charlesetta Shanks, MD 06/03/18 1013

## 2018-06-03 NOTE — Discharge Instructions (Signed)
1.  Schedule recheck with your family doctor.  If your symptoms have not improved significantly with treatment with antibiotics and sinus medication, discussed referral to neurology for persistent headaches with dizziness. 2.  Return to the emergency department immediately if you develop double vision, suddenly worsening headache, fever, weakness numbness or tingling of extremities or other concerning symptoms.

## 2018-06-07 ENCOUNTER — Ambulatory Visit: Payer: Medicare Other

## 2018-06-07 ENCOUNTER — Other Ambulatory Visit: Payer: Self-pay

## 2018-06-07 ENCOUNTER — Ambulatory Visit (INDEPENDENT_AMBULATORY_CARE_PROVIDER_SITE_OTHER): Payer: Medicare Other | Admitting: Family Medicine

## 2018-06-07 ENCOUNTER — Encounter: Payer: Self-pay | Admitting: Family Medicine

## 2018-06-07 VITALS — BP 102/70 | HR 94 | Temp 97.5°F | Resp 17 | Ht 63.0 in | Wt 178.2 lb

## 2018-06-07 DIAGNOSIS — F17210 Nicotine dependence, cigarettes, uncomplicated: Secondary | ICD-10-CM

## 2018-06-07 DIAGNOSIS — D473 Essential (hemorrhagic) thrombocythemia: Secondary | ICD-10-CM

## 2018-06-07 DIAGNOSIS — F332 Major depressive disorder, recurrent severe without psychotic features: Secondary | ICD-10-CM

## 2018-06-07 DIAGNOSIS — D72829 Elevated white blood cell count, unspecified: Secondary | ICD-10-CM | POA: Diagnosis not present

## 2018-06-07 DIAGNOSIS — D75839 Thrombocytosis, unspecified: Secondary | ICD-10-CM

## 2018-06-07 DIAGNOSIS — F411 Generalized anxiety disorder: Secondary | ICD-10-CM | POA: Diagnosis not present

## 2018-06-07 MED ORDER — BUSPIRONE HCL 10 MG PO TABS: 10.0000 mg | ORAL_TABLET | Freq: Two times a day (BID) | ORAL | 1 refills | Status: DC

## 2018-06-07 MED ORDER — TRAZODONE HCL 50 MG PO TABS: 100.0000 mg | ORAL_TABLET | Freq: Every day | ORAL | 3 refills | Status: DC

## 2018-06-07 NOTE — Progress Notes (Signed)
Patient ID: Natalie Edwards, female    DOB: May 02, 1965, 53 y.o.   MRN: [id number]  PCP: Scot Jun, FNP  Chief Complaint  Patient presents with  . Medication Management    here to discuss changing her Ambien    Subjective:  HPI  Natalie Edwards is a 53 y.o. female presents initially for medication management, however, will address anxiety and depression given PHQ-9 and GAD-7 scores.   Major Depression and Anxiety Screen Patient with a history of anxiety and depression presents today with a positive PHQ 9 and gad 7 screening. PHQ 9 indicates suicidal ideations although patient does not have a plan. 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  She does not indicate anything specifically just remarks that "I  have a lot going on". She reports that she was attending a counselor approximately over a year ago. She was prescribed trazodone reports that it helped with sleep was unaware that is used to treat depression.  She reports that she is highly sensitive to medications and reactions. She was previously prescribed hydroxyzine for anxiety and reports medication caused palpitations and jitteriness.   Medication reaction  Patient reports a reaction to Ambien. Reaction occurred last we in that she took medication and upon awakening caused jitteriness, abdominal pain, and headaches. This medication is not new and patient has taken for some in the past without adverse reaction. She discontinued medication and has not had these symptoms since that time. Patient previously prescribed both Ambien and Restoril simultaneously for insomnia symptoms.    Lab results Reviewed recent labs results from annual physical exam. Discussed elevated WBC and platelet count. Patient was previously followed by hematology for thrombocytosis and leukocytosis, however never returned for follow-up visits. Last hematology evaluation occurred 09/2016. Social  History   Socioeconomic History  . Marital status: Single    Spouse name: Not on file  . Number of children: 3  . Years of education: Not on file  . Highest education level: Not on file  Occupational History  . Not on file  Social Needs  . Financial resource strain: Not on file  . Food insecurity:    Worry: Not on file    Inability: Not on file  . Transportation needs:    Medical: Not on file    Non-medical: Not on file  Tobacco Use  . Smoking status: Current Every Day Smoker    Packs/day: 0.25    Years: 25.00    Pack years: 6.25    Types: Cigarettes  . Smokeless tobacco: Never Used  . Tobacco comment: Pt tried to quit - helps with anxiety   Substance and Sexual Activity  . Alcohol use: Yes    Comment: occasional  . Drug use: No  . Sexual activity: Yes    Comment: hysterectomy  Lifestyle  . Physical activity:    Days per week: Not on file    Minutes per session: Not on file  . Stress: Not on file  Relationships  . Social connections:    Talks on phone: Not on file    Gets together: Not on file    Attends religious service: Not on file    Active member of club or organization: Not on file    Attends meetings of clubs or organizations: Not on file    Relationship status: Not on file  . Intimate partner violence:    Fear of current or ex partner: Not  on file    Emotionally abused: Not on file    Physically abused: Not on file    Forced sexual activity: Not on file  Other Topics Concern  . Not on file  Social History Narrative  . Not on file    Family History  Problem Relation Age of Onset  . Cancer Maternal Uncle        Lung  . Cancer Maternal Uncle        Lung  . Cancer Maternal Uncle        Lung   Review of Systems Pertinent negatives listed in HPI Patient Active Problem List   Diagnosis Date Noted  . GAD (generalized anxiety disorder) 05/07/2018  . Chronic gastric ulcer 05/07/2018  . Essential hypertension 05/07/2018  . Iron deficiency anemia  due to chronic blood loss 09/23/2016  . Peptic ulcer disease with hemorrhage 09/23/2016  . Thrombocytosis ([place]) 09/09/2016  . Leukocytosis 09/09/2016  . History of hypertension 07/31/2016  . H/O ulcer disease 07/31/2016    No Known Allergies  Prior to Admission medications   Medication Sig Start Date End Date Taking? Authorizing Provider  amLODipine-benazepril (LOTREL) 10-40 MG capsule Take 1 capsule by mouth daily for 30 days. 05/04/18  Yes Scot Jun, FNP  amoxicillin-clavulanate (AUGMENTIN) 875-125 MG tablet Take 1 tablet by mouth 2 (two) times daily. One po bid x 7 days 06/03/18  Yes Pfeiffer, Jeannie Done, MD  esomeprazole (NEXIUM) 40 MG capsule Take 1 capsule (40 mg total) by mouth 2 (two) times daily before a meal. 05/10/18  Yes Thornton Park, MD  famotidine (PEPCID) 20 MG tablet Take 1 tablet (20 mg total) by mouth 2 (two) times daily. 05/10/18  Yes Thornton Park, MD  ferrous sulfate 325 (65 FE) MG EC tablet Take 1 tablet (325 mg total) by mouth 3 (three) times daily with meals. 09/23/16  Yes Perlov, Mikhail Darnell Level, MD  loratadine-pseudoephedrine (CLARITIN-D 24 HOUR) 10-240 MG 24 hr tablet Take 1 tablet by mouth daily. 06/03/18  Yes Charlesetta Shanks, MD  sucralfate (CARAFATE) 1 g tablet Take 1 tablet (1 g total) by mouth 4 (four) times daily. 05/10/18  Yes Thornton Park, MD  busPIRone (BUSPAR) 10 MG tablet Take 1 tablet (10 mg total) by mouth 2 (two) times daily. 06/07/18   Scot Jun, FNP  traZODone (DESYREL) 50 MG tablet Take 2 tablets (100 mg total) by mouth at bedtime. 06/07/18   Scot Jun, FNP    Past Medical, Surgical Family and Social History reviewed and updated.    Objective:   Today's Vitals   06/07/18 1040  BP: 102/70  Pulse: 94  Resp: 17  Temp: (!) 97.5 F (36.4 C)  TempSrc: Oral  SpO2: 96%  Weight: 178 lb 3.2 oz (80.8 kg)  Height: 5\' 3"  (1.6 m)    Wt Readings from Last 3 Encounters:  06/07/18 178 lb 3.2 oz (80.8 kg)  06/03/18 179 lb (81.2  kg)  05/31/18 177 lb 6.4 oz (80.5 kg)     Physical Exam General appearance: alert, well developed, well nourished, no distress. Head: Normocephalic, without obvious abnormality, atraumatic Respiratory: Respirations even and unlabored, normal respiratory rate Heart: rate and rhythm normal. No gallop or murmurs noted on exam  Extremities: No gross deformities Skin: Skin color, texture, turgor normal. No rashes seen  Psych: Anxious mood. Speech slightly pressured. Neurologic: Alert, oriented to person, place, and time  No results found for: POCGLU  Lab Results  Component Value Date   HGBA1C 5.7 (H)  05/31/2018    Assessment & Plan:  1. Thrombocytosis ([place]) - Ambulatory referral to Hematology  2. Leukocytosis, unspecified type - Ambulatory referral to Hematology  3. GAD (generalized anxiety disorder) 4. Severe episode of recurrent major depressive disorder, without psychotic features ([place]) Will trial Trazodone 100 mg once daily at bedtime. Trial Buspar 10 mg twice daily for anxiety. Declined resuming Temazepam, given documented suicidal ideations and recent intolerance of Ambien which is within the same medication drug class. Appointment scheduled with LCSW, Christa See 06/08/18, initially. Patient may warrant a higher level of mental health care. Discontinued Ambien. Patient did not bring recently filled prescription for Ambien as she reports discarding medication in toilet at home.  -The patient was given clear instructions to go to ER or return to medical center if symptoms do not improve, worsen or new problems develop. The patient verbalized understanding.    Molli Barrows, FNP Primary Care at [facility] [address], [place] [phone]: [phone]

## 2018-06-07 NOTE — Patient Instructions (Signed)
Persistent Depressive Disorder  Persistent depressive disorder (PDD) is a mental health condition. PDD causes symptoms of low-level depression for 2 years or longer. It may also be called long-term (chronic) depression or dysthymia. PDD may include episodes of more severe depression that last for about 2 weeks (major depressive disorder or MDD). PDD can affect the way you think, feel, and sleep. This condition may also affect your relationships. You may be more likely to get sick if you have PDD. Symptoms of PDD occur for most of the day and may include:  Feeling tired (fatigue).  Low energy.  Eating too much or too little.  Sleeping too much or too little.  Feeling restless or agitated.  Feeling hopeless.  Feeling worthless or guilty.  Feeling worried or nervous (anxiety).  Trouble concentrating or making decisions.  Low self-esteem.  A negative way of looking at things ([place]).  Not being able to have fun or feel pleasure.  Avoiding interacting with people.  Getting angry or annoyed easily (irritability).  Acting aggressive or angry. Follow these instructions at home: Activity  Go back to your normal activities as told by your doctor.  Exercise regularly as told by your doctor. General instructions  Take over-the-counter and prescription medicines only as told by your doctor.  Do not drink alcohol. Or, limit how much alcohol you drink to no more than 1 drink a day for nonpregnant women and 2 drinks a day for men. One drink equals 12 oz of beer, 5 oz of wine, or 1 oz of hard liquor. Alcohol can affect any antidepressant medicines you are taking. Talk with your doctor about your alcohol use.  Eat a healthy diet and get plenty of sleep.  Find activities that you enjoy each day.  Consider joining a support group. Your doctor may be able to suggest a support group.  Keep all follow-up visits as told by your doctor. This is important. Where to find more  information National Alliance on Mental Illness  www.nami.org [place]. National Institute of Mental Health  www.nimh.nih.gov National Suicide Prevention Lifeline  (1-800-273-8255).  This is free, 24-hour help. Contact a doctor if:  Your symptoms get worse.  You have new symptoms.  You have trouble sleeping or doing your daily activities. Get help right away if:  You self-harm.  You have serious thoughts about hurting yourself or others.  You see, hear, taste, smell, or feel things that are not there (hallucinate). This information is not intended to replace advice given to you by your health care provider. Make sure you discuss any questions you have with your health care provider. Document Released: 02/12/2015 Document Revised: 10/26/2015 Document Reviewed: 10/26/2015 Elsevier Interactive Patient Education  2019 Elsevier Inc.  

## 2018-06-08 ENCOUNTER — Telehealth (INDEPENDENT_AMBULATORY_CARE_PROVIDER_SITE_OTHER): Payer: Medicare Other | Admitting: Licensed Clinical Social Worker

## 2018-06-08 DIAGNOSIS — Z658 Other specified problems related to psychosocial circumstances: Secondary | ICD-10-CM

## 2018-06-08 DIAGNOSIS — Z915 Personal history of self-harm: Secondary | ICD-10-CM | POA: Diagnosis not present

## 2018-06-08 DIAGNOSIS — F411 Generalized anxiety disorder: Secondary | ICD-10-CM | POA: Diagnosis not present

## 2018-06-08 DIAGNOSIS — F332 Major depressive disorder, recurrent severe without psychotic features: Secondary | ICD-10-CM | POA: Diagnosis not present

## 2018-06-08 DIAGNOSIS — F339 Major depressive disorder, recurrent, unspecified: Secondary | ICD-10-CM

## 2018-06-11 ENCOUNTER — Encounter: Payer: Self-pay | Admitting: Licensed Clinical Social Worker

## 2018-06-11 NOTE — BH Specialist Note (Signed)
Integrated Behavioral Health Visit via Telemedicine (Telephone)  06/11/2018 Natalie Edwards [id number]   Session Start time: 1:45 PM  Session End time: 2:20 PM Total time: 35 minutes  Referring Provider: FNP Kenton Kingfisher Type of Visit: Telephonic Patient location: Home [facility] Provider location: Office All persons participating in visit: Pt  Confirmed patient's address: Yes  Confirmed patient's phone number: No  Any changes to demographics: No   Confirmed patient's insurance: Yes  Any changes to patient's insurance: No   Discussed confidentiality: Yes    The following statements were read to the patient and/or legal guardian that are established with the [facility] Provider.  "The purpose of this phone visit is to provide behavioral health care while limiting exposure to the coronavirus (COVID19). "  "By engaging in this telephone visit, you consent to the provision of healthcare.  Additionally, you authorize for your insurance to be billed for the services provided during this telephone visit."   Patient and/or legal guardian consented to telephone visit: Yes   PRESENTING CONCERNS: Patient and/or family reports the following symptoms/concerns: Pt reports difficulty managing depression and anxiety due to ongoing psychosocial stressors Duration of problem: "years"; Severity of problem: moderate  STRENGTHS (Protective Factors/Coping Skills): Pt has good insight and has established boundaries with family  GOALS ADDRESSED: Patient will: 1.  Reduce symptoms of: anxiety and depression  2.  Increase knowledge and/or ability of: coping skills and healthy habits  3.  Demonstrate ability to: Increase adequate support systems for patient/family and Improve medication compliance  INTERVENTIONS: Interventions utilized:  Mindfulness or Relaxation Training, Supportive Counseling and Psychoeducation and/or Health Education Standardized Assessments completed: Not  Needed  ASSESSMENT: Patient currently experiencing depression and anxiety triggered by ongoing psychosocial stressors. 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 She has hx of suicidal attempts (2006 by overdose) Pt was successful in identifying protective factors. LCSWA discussed importance of establishing safety plan and was provided crisis intervention resources. Denies current SI/HI.   Patient may benefit from psychotherapy. She is participating in medication management through PCP; however, has not picked up all of her prescribed medications. LCSWA discussed benefits of medication compliance and utilizing healthy coping skills to manage and/or decrease reported symptoms.   PLAN: 1. Follow up with behavioral health clinician on : Pt was encouraged to contact LCSWA if symptoms worsen or fail to improve to schedule behavioral appointments at [facility]. 2. Behavioral recommendations: LCSWA recommends that pt apply healthy coping skills discussed and comply with medication management. Follow up appointment with LCSWA scheduled for one week 3. Referral(s): [place] (In Clinic)  Rebekah [place], [place] 06/11/2018 2:43 PM

## 2018-06-15 ENCOUNTER — Telehealth: Payer: Self-pay | Admitting: Internal Medicine

## 2018-06-15 ENCOUNTER — Other Ambulatory Visit: Payer: Self-pay

## 2018-06-15 ENCOUNTER — Ambulatory Visit (INDEPENDENT_AMBULATORY_CARE_PROVIDER_SITE_OTHER): Payer: Medicare Other | Admitting: Licensed Clinical Social Worker

## 2018-06-15 DIAGNOSIS — F419 Anxiety disorder, unspecified: Secondary | ICD-10-CM

## 2018-06-15 DIAGNOSIS — F331 Major depressive disorder, recurrent, moderate: Secondary | ICD-10-CM | POA: Diagnosis not present

## 2018-06-15 NOTE — Telephone Encounter (Signed)
A new hem appt has been scheduled fo rthe pt to see Dr. Walden Field on 4/13 at 850am. Pt aware to arrive 15 minutes early.

## 2018-06-17 NOTE — BH Specialist Note (Signed)
Integrated Behavioral Health Visit via Telemedicine (Telephone)  06/17/2018 Natalie Edwards [id number]   Session Start time: 2:30 PM  Session End time: 3:15 PM Total time: 45 minutes  Referring Provider: FNP Kenton Kingfisher Type of Visit: Telephonic Patient location: Home [facility] Provider location: Office All persons participating in visit: Pt  Confirmed patient's address: Yes  Confirmed patient's phone number: Yes  Any changes to demographics: No   Confirmed patient's insurance: Yes  Any changes to patient's insurance: No   Discussed confidentiality: Yes    The following statements were read to the patient and/or legal guardian that are established with the [facility] Provider.  "The purpose of this phone visit is to provide behavioral health care while limiting exposure to the coronavirus (COVID19).  There is a possibility of technology failure and discussed alternative modes of communication if that failure occurs."  "By engaging in this telephone visit, you consent to the provision of healthcare.  Additionally, you authorize for your insurance to be billed for the services provided during this telephone visit."   Patient and/or legal guardian consented to telephone visit: Yes   PRESENTING CONCERNS: Patient and/or family reports the following symptoms/concerns: Pt reports increased stress triggered by work environment Duration of problem: Ongoing; Severity of problem: moderate  STRENGTHS (Protective Factors/Coping Skills): Pt is family oriented and has the will to provide for family. She values honesty and is willing to participate in therapy to strengthen coping skilss  GOALS ADDRESSED: Patient will: 1.  Reduce symptoms of: anxiety, depression and stress  2.  Increase knowledge and/or ability of: self-management skills  3.  Demonstrate ability to: utilize healthy strategies  INTERVENTIONS: Interventions utilized:  Solution-Focused Strategies and Supportive  Counseling Standardized Assessments completed: Not Needed  ASSESSMENT: Patient currently experiencing depression and anxiety triggered by ongoing psychosocial stressors. 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 Pt denies current suicidal/homicidal ideations.  Patient disclosed difficulty managing temper at place of employment. She has turned in notice of leave; however, is concerned about the remaining two weeks of employment. LCSWA informed pt of different communication styles (passive, aggressive, and assertive) LCSWA discussed various strategies to assist with conflict resolution. Pt has been complying with medication management and reports no additional concerns.  PLAN: 1. Follow up with behavioral health clinician on : Pt was encouraged tocontact LCSWA if symptoms worsen or fail to improveto schedule behavioral appointments. 2. Behavioral recommendations: LCSWA recommends that pt apply healthy coping skills discussed and comply with medication management. Follow up appointment with LCSWA scheduled for one week 3. Referral(s): [place] (In Clinic)  Rebekah Chesterfield, [place] 06/17/2018 2:44 PM

## 2018-06-22 ENCOUNTER — Other Ambulatory Visit: Payer: Self-pay

## 2018-06-22 ENCOUNTER — Ambulatory Visit: Payer: Medicare Other | Admitting: Licensed Clinical Social Worker

## 2018-06-22 ENCOUNTER — Ambulatory Visit (INDEPENDENT_AMBULATORY_CARE_PROVIDER_SITE_OTHER): Payer: Medicare Other | Admitting: Licensed Clinical Social Worker

## 2018-06-22 DIAGNOSIS — F331 Major depressive disorder, recurrent, moderate: Secondary | ICD-10-CM

## 2018-06-24 ENCOUNTER — Telehealth: Payer: Self-pay | Admitting: Internal Medicine

## 2018-06-24 NOTE — Telephone Encounter (Signed)
Pt has been cld and rescheduled to see Dr. Walden Field on 4/22 at 950am.

## 2018-06-24 NOTE — BH Specialist Note (Signed)
Integrated Behavioral Health Visit via Telemedicine (Telephone)  06/22/2018 Natalie Edwards [id number]   Session Start time: 3:00 PM  Session End time: 3:30 PM Total time: 30 minutes  Referring Provider: FNP Kenton Kingfisher Type of Visit: Telephonic Patient location: [place] [facility] Provider location: Office All persons participating in visit: Pt  Confirmed patient's address: Yes  Confirmed patient's phone number: Yes  Any changes to demographics: No   Confirmed patient's insurance: Yes  Any changes to patient's insurance: No   Discussed confidentiality: Yes    The following statements were read to the patient and/or legal guardian that are established with the [facility] Provider.  "The purpose of this phone visit is to provide behavioral health care while limiting exposure to the coronavirus (COVID19).  There is a possibility of technology failure and discussed alternative modes of communication if that failure occurs."  "By engaging in this telephone visit, you consent to the provision of healthcare.  Additionally, you authorize for your insurance to be billed for the services provided during this telephone visit."   Patient and/or legal guardian consented to telephone visit: Yes   PRESENTING CONCERNS: Patient and/or family reports the following symptoms/concerns: Pt reports difficulty managing mental health when triggered by loved ones Duration of problem: Ongoing; Severity of problem: moderate  STRENGTHS (Protective Factors/Coping Skills): Pt is family oriented and has the will to provide for family. She values honesty and is willing to participate in therapy to strengthen coping skilss  GOALS ADDRESSED: Patient will: 1.  Reduce symptoms of: agitation, anxiety and depression  2.  Increase knowledge and/or ability of: self-management skills  3.  Demonstrate ability to: Increase healthy adjustment to current life circumstances  INTERVENTIONS: Interventions utilized:  Brief CBT  and Supportive Counseling Standardized Assessments completed: Not Needed  ASSESSMENT: Patient currently experiencingdepression and anxiety triggered by ongoing psychosocial stressors. Pt denies current suicidal/homicidal ideations.  Patient disclosed difficulty managing mental health. States that she recently spoken with estranged spouse, which resulted in days of sadness. LCSWA discussed importance of establishing healthy boundaries, in addition, to therapeutic strategies in doing so. Pt has been complying with medication management and reports no additional concerns.  PLAN: 1. Follow up with behavioral health clinician on : Pt was encouraged tocontact LCSWA if symptoms worsen or fail to improveto schedule behavioral appointments 2. Behavioral recommendations: LCSWA recommends that pt apply healthy coping skills discussed and comply with medication management. Follow up appointment with LCSWA scheduled for two weeks 3. Referral(s): [place] (In Clinic)  Rebekah Chesterfield, [place] 06/24/2018 10:51 PM

## 2018-06-24 NOTE — Telephone Encounter (Signed)
Lft vm to reschedule 4/13 appt w/Dr. Walden Field

## 2018-06-28 ENCOUNTER — Encounter: Payer: Medicare Other | Admitting: Internal Medicine

## 2018-06-30 ENCOUNTER — Other Ambulatory Visit: Payer: Self-pay | Admitting: Family Medicine

## 2018-06-30 ENCOUNTER — Ambulatory Visit (INDEPENDENT_AMBULATORY_CARE_PROVIDER_SITE_OTHER): Payer: Medicare Other | Admitting: Family Medicine

## 2018-06-30 ENCOUNTER — Encounter: Payer: Self-pay | Admitting: Family Medicine

## 2018-06-30 ENCOUNTER — Telehealth: Payer: Self-pay | Admitting: Family Medicine

## 2018-06-30 ENCOUNTER — Other Ambulatory Visit: Payer: Self-pay

## 2018-06-30 ENCOUNTER — Other Ambulatory Visit: Payer: Medicare Other

## 2018-06-30 DIAGNOSIS — R399 Unspecified symptoms and signs involving the genitourinary system: Secondary | ICD-10-CM

## 2018-06-30 DIAGNOSIS — N3001 Acute cystitis with hematuria: Secondary | ICD-10-CM

## 2018-06-30 LAB — POCT URINALYSIS DIP (CLINITEK)
Bilirubin, UA: NEGATIVE
Glucose, UA: NEGATIVE mg/dL
Ketones, POC UA: NEGATIVE mg/dL
Nitrite, UA: POSITIVE — AB
POC PROTEIN,UA: 100 — AB
Spec Grav, UA: 1.015 (ref 1.010–1.025)
Urobilinogen, UA: 0.2 E.U./dL
pH, UA: 5.5 (ref 5.0–8.0)

## 2018-06-30 MED ORDER — CIPROFLOXACIN HCL 500 MG PO TABS: 500.0000 mg | ORAL_TABLET | Freq: Two times a day (BID) | ORAL | 0 refills | Status: AC

## 2018-06-30 MED ORDER — PHENAZOPYRIDINE HCL 200 MG PO TABS: 200.0000 mg | ORAL_TABLET | Freq: Three times a day (TID) | ORAL | 0 refills | Status: DC | PRN

## 2018-06-30 NOTE — Telephone Encounter (Signed)
Called patient to do their pre-visit COVID screening.  Have you recently traveled internationally([place], [place], [place], [place], [place]) or within the [place] to a hotspot area([place], [place], [place], [place], [place])? no  Are you currently experiencing any of the following: fever, cough, SHOB, fatigue? no  Have you been in contact with anyone who has recently travelled? no  Have you been in contact with anyone who is experiencing fever, cough, SHOB, fatigue or been diagnosed with COVID  or works in or has recently visited a SNF? no  Patient states that she's on the way to the office now.

## 2018-06-30 NOTE — Progress Notes (Signed)
Patient ID: Natalie Edwards, female    DOB: 15-Jul-1965, 53 y.o.   MRN: [id number]  PCP: Scot Jun, FNP  Chief Complaint  Patient presents with  . urge to urinate  . Urinary Frequency    Subjective:  HPI Natalie Edwards is a 53 y.o. consents to a telephonic encounter today. Natalie Edwards is at home during today's encounter. Provider is located at primary care office during today's encounter.   Onset of symptoms: x 4 days ago.  Symptoms include: urgency, dysuria, left sided flank pain No history of chronic UTI or urinary retention Alleviating factors: none  OTC/Prescription medications used: none  Denies hematuria, nausea, vomiting, fever, or chills. Social History   Socioeconomic History  . Marital status: Single    Spouse name: Not on file  . Number of children: 3  . Years of education: Not on file  . Highest education level: Not on file  Occupational History  . Not on file  Social Needs  . Financial resource strain: Not on file  . Food insecurity:    Worry: Not on file    Inability: Not on file  . Transportation needs:    Medical: Not on file    Non-medical: Not on file  Tobacco Use  . Smoking status: Current Every Day Smoker    Packs/day: 0.25    Years: 25.00    Pack years: 6.25    Types: Cigarettes  . Smokeless tobacco: Never Used  . Tobacco comment: Pt tried to quit - helps with anxiety   Substance and Sexual Activity  . Alcohol use: Yes    Comment: occasional  . Drug use: No  . Sexual activity: Yes    Comment: hysterectomy  Lifestyle  . Physical activity:    Days per week: Not on file    Minutes per session: Not on file  . Stress: Not on file  Relationships  . Social connections:    Talks on phone: Not on file    Gets together: Not on file    Attends religious service: Not on file    Active member of club or organization: Not on file    Attends meetings of clubs or organizations: Not on file    Relationship status: Not on file  . Intimate partner  violence:    Fear of current or ex partner: Not on file    Emotionally abused: Not on file    Physically abused: Not on file    Forced sexual activity: Not on file  Other Topics Concern  . Not on file  Social History Narrative  . Not on file    Family History  Problem Relation Age of Onset  . Cancer Maternal Uncle        Lung  . Cancer Maternal Uncle        Lung  . Cancer Maternal Uncle        Lung   Review of Systems  Pertinent negatives listed in HPI  Patient Active Problem List   Diagnosis Date Noted  . GAD (generalized anxiety disorder) 05/07/2018  . Chronic gastric ulcer 05/07/2018  . Essential hypertension 05/07/2018  . Iron deficiency anemia due to chronic blood loss 09/23/2016  . Peptic ulcer disease with hemorrhage 09/23/2016  . Thrombocytosis ([place]) 09/09/2016  . Leukocytosis 09/09/2016  . History of hypertension 07/31/2016  . H/O ulcer disease 07/31/2016    Allergies  Allergen Reactions  . Ambien [Zolpidem Tartrate]     Jitteriness, nervousness, abdominal pain  . Hydroxyzine  Palpitations and jitteriness     Prior to Admission medications   Medication Sig Start Date End Date Taking? Authorizing Provider  amLODipine-benazepril (LOTREL) 10-40 MG capsule Take 1 capsule by mouth daily for 30 days. 05/04/18  Yes Scot Jun, FNP  busPIRone (BUSPAR) 10 MG tablet Take 1 tablet (10 mg total) by mouth 2 (two) times daily. 06/07/18  Yes Scot Jun, FNP  esomeprazole (NEXIUM) 40 MG capsule Take 1 capsule (40 mg total) by mouth 2 (two) times daily before a meal. 05/10/18  Yes Thornton Park, MD  famotidine (PEPCID) 20 MG tablet Take 1 tablet (20 mg total) by mouth 2 (two) times daily. 05/10/18  Yes Thornton Park, MD  ferrous sulfate 325 (65 FE) MG EC tablet Take 1 tablet (325 mg total) by mouth 3 (three) times daily with meals. 09/23/16  Yes Perlov, Marinell Blight, MD  sucralfate (CARAFATE) 1 g tablet Take 1 tablet (1 g total) by mouth 4 (four) times  daily. 05/10/18  Yes Thornton Park, MD  traZODone (DESYREL) 50 MG tablet Take 2 tablets (100 mg total) by mouth at bedtime. 06/07/18  Yes Scot Jun, FNP  loratadine-pseudoephedrine (CLARITIN-D 24 HOUR) 10-240 MG 24 hr tablet Take 1 tablet by mouth daily. Patient not taking: Reported on 06/30/2018 06/03/18   Charlesetta Shanks, MD    Past Medical, Surgical Family and Social History reviewed and updated.   Wt Readings from Last 3 Encounters:  06/07/18 178 lb 3.2 oz (80.8 kg)  06/03/18 179 lb (81.2 kg)  05/31/18 177 lb 6.4 oz (80.5 kg)    No results found for: POCGLU  Lab Results  Component Value Date   HGBA1C 5.7 (H) 05/31/2018      Assessment & Plan:  1. Acute cystitis with hematuria Urine sample provided earlier today. UA abnormal with presence of nitrates Will treat with Cipro 500 mg BID x 5 days. Pyridine prescribed for pain. Red flags discussed.  Urine culture pending    A total of 15 minutes spent obtaining history, discussing symptoms, discussing differentials, prescribing and discussing indications for treatment. Provided education.  -The patient was given clear instructions to go to ER or return to medical center if symptoms do not improve, worsen or new problems develop. The patient verbalized understanding.    Molli Barrows, FNP Primary Care at [facility] [address] [place] [phone]: [phone]

## 2018-06-30 NOTE — Telephone Encounter (Signed)
Contacted patient, scheduled for tele visit at 4:10.

## 2018-06-30 NOTE — Telephone Encounter (Signed)
Please call patient and schedule for 4:10 telephone visit for UTI. I will need for her to stop by the office prior to 4:10 appointment and provide a urine sample to the office to evaluate her urine in order to treat. Please schedule urine collection as a lab visit

## 2018-06-30 NOTE — Progress Notes (Deleted)
Left abd pain, started yesterday, took tylenol/  No burning sensation  Urge to urinate

## 2018-07-02 ENCOUNTER — Encounter: Payer: Medicare Other | Admitting: Internal Medicine

## 2018-07-02 LAB — URINE CULTURE

## 2018-07-05 ENCOUNTER — Ambulatory Visit: Payer: Medicare Other

## 2018-07-07 ENCOUNTER — Encounter: Payer: Self-pay | Admitting: Internal Medicine

## 2018-07-07 ENCOUNTER — Other Ambulatory Visit: Payer: Self-pay

## 2018-07-07 ENCOUNTER — Inpatient Hospital Stay: Payer: Medicare Other | Attending: Internal Medicine | Admitting: Internal Medicine

## 2018-07-07 VITALS — BP 113/70 | HR 90 | Temp 98.6°F | Resp 18 | Ht 63.0 in | Wt 177.5 lb

## 2018-07-07 DIAGNOSIS — R109 Unspecified abdominal pain: Secondary | ICD-10-CM | POA: Diagnosis not present

## 2018-07-07 DIAGNOSIS — D75839 Thrombocytosis, unspecified: Secondary | ICD-10-CM

## 2018-07-07 DIAGNOSIS — D473 Essential (hemorrhagic) thrombocythemia: Secondary | ICD-10-CM | POA: Diagnosis not present

## 2018-07-07 DIAGNOSIS — D72829 Elevated white blood cell count, unspecified: Secondary | ICD-10-CM | POA: Insufficient documentation

## 2018-07-07 DIAGNOSIS — Z720 Tobacco use: Secondary | ICD-10-CM | POA: Diagnosis not present

## 2018-07-07 DIAGNOSIS — I10 Essential (primary) hypertension: Secondary | ICD-10-CM | POA: Insufficient documentation

## 2018-07-07 DIAGNOSIS — K259 Gastric ulcer, unspecified as acute or chronic, without hemorrhage or perforation: Secondary | ICD-10-CM

## 2018-07-07 DIAGNOSIS — D72828 Other elevated white blood cell count: Secondary | ICD-10-CM

## 2018-07-07 DIAGNOSIS — Z801 Family history of malignant neoplasm of trachea, bronchus and lung: Secondary | ICD-10-CM | POA: Diagnosis not present

## 2018-07-07 NOTE — Progress Notes (Signed)
Referring physician:  Molli Barrows, FNP    Diagnosis Other elevated white blood cell (WBC) count - Plan: CBC with Differential ([place] Only), CMP ([place] only), Lactate dehydrogenase (LDH), Sedimentation rate, Iron and TIBC, Ferritin  Thrombocytosis ([facility]) - Plan: CBC with Differential ([place] Only), CMP ([place] only), Lactate dehydrogenase (LDH), Sedimentation rate, Iron and TIBC, Ferritin  Staging Cancer Staging No matching staging information was found for the patient.  Assessment and Plan  1.  Leucocytosis.  53 year old fermale referred for evaluation due to leucocytosis and thrombocytosis.  Pt was diagnosed with UTI on 06/30/2018.  She just completed abx 3 days ago.  She has more than 20 year history of smoking.  Pt had labs done 05/31/2018 that showed WBC 13.6 HB 12.5 plts 570,000.  Labs done 06/03/2018 showed WBC 15.9 HB 12.4 plts 557,000.  Chemistries WNL with K+ 3.7 Cr 1.25.  Pt denies fevers, chills, night sweats and has noted no adenopathy.  She reports occasional hot flashes.  Pt was previously seen by Dr. Lebron Conners 09/23/2016 for same condition.  She had flow cytometry done 09/09/2016 that was negative.  Pt denies any family history of leukemias or lymphomas.  She reports abdominal pain and has been seen by GI for this.  Pt is seen today as a new pt for evaluation due to leucocytosis and thrombocytosis.    I discussed with pt prior work-up by Dr. Lebron Conners was negative.  Review of records show his evaluation done 09/23/2016 showed:  "54 year old female initially referred to our clinic for evaluation of leukocytosis and thrombocytosis. During evaluation mild normochromic anemia was identified. Additional testing demonstrated no evidence of dysmorphic red blood cells, white blood cells, or platelets. Iron panel demonstrates mild to moderate iron deficiency. Reported EGD demonstrated multifocal gastric ulcers likely the source of the blood loss and reactive leukocytosis and  thrombocytosis.  At this time, I will defer management of the gastric ulcers to the managing gastroenterologist."    I discussed with her possible etiology of WBC elevation may be due to smoking, infections.  She was recently diagnosed with UTI which could explain WBC elevation.  Pt has recently completed abx so will have pt RTC in 07/2018 with labs.  If WBC remains elevated, will check BCR/ABL and Jak 2.  Pt advised to notify the office if any problems prior to next visit.    2.  Thrombocytosis.  Dr. Lebron Conners felt plt count was reactive due to iron deficiency anemia( IDA).  Ferritin was low normal at 23 on labs done 09/09/2016 which is consistent with IDA.  On RTC with repeat  iron studies, sed rate and consider BCR/ABL and JAK 2 if levels remain elevated and no evidence of IDA.    3.  Abdominal pain.  Pt had CT abdomen and pelvis done 07/31/2016 that showed  IMPRESSION: 1. Apparent wall thickening of the gastric antrum suggesting gastritis. This may, alternatively, be due to lack of distension. Recommend follow-up upper GI or endoscopy on a nonemergent basis. 2. No other evidence of an acute abnormality within the abdomen or pelvis. 3. Left adrenal adenoma. 4. Bilateral low-density renal masses consistent with cysts, some too small to fully characterize. 5. Colonic diverticula without evidence of diverticulitis. 6. Aortic atherosclerosis. 7. Status post cholecystectomy and hysterectomy.  Pt had EGD done 09/12/2016 that showed reactive gastric mucosa but no evidence of malignancy.    Pt should follow-up with GI or PCP if ongoing symptoms.    4.  Hypertension.  BP is 113/70.  Follow-up with PCP for monitoring.    5.  UTI.  Pt was diagnosed with E. Coli UTI on 06/30/2018.  She completed abx 3 days ago.  Will repeat labs in 2-3 weeks as infection may also cause elevated WBC count.   6.  Smoking.  Cessation recommended.  Will determine if pt will be a candidate for lung cancer screening based on  more than 20 year history of smoking.    7.  Health maintenance.  GI follow-up and mammogram screenings as recommended.    45 minutes spent with more than 50% in review of records, counseling and coordination of care.    HPI:  53 year old fermale referred for evaluation due to leucocytosis and thrombocytosis.  Pt was diagnosed with UTI on 06/30/2018.  She just completed abx 3 days ago.  She has more than 20 year history of smoking.  Pt had labs done 05/31/2018 that showed WBC 13.6 HB 12.5 plts 570,000.  Labs done 06/03/2018 showed WBC 15.9 HB 12.4 plts 557,000.  Chemistries WNL with K+ 3.7 Cr 1.25.  Pt denies fevers, chills, night sweats and has noted no adenopathy.  She reports occasional hot flashes.  Pt was previously seen by Dr. Lebron Conners 09/23/2016 for same condition.  She had flow cytometry done 09/09/2016 that was negative.  Pt denies any family history of leukemias or lymphomas.  She reports abdominal pain and has been seen by GI for this.  Pt is seen today as a new pt for evaluation due to leucocytosis and thrombocytosis.    Problem List Patient Active Problem List   Diagnosis Date Noted  . GAD (generalized anxiety disorder) [F41.1] 05/07/2018  . Chronic gastric ulcer [K25.7] 05/07/2018  . Essential hypertension [I10] 05/07/2018  . Iron deficiency anemia due to chronic blood loss [D50.0] 09/23/2016  . Peptic ulcer disease with hemorrhage [K27.4] 09/23/2016  . Thrombocytosis ([place]) [D47.3] 09/09/2016  . Leukocytosis [D72.829] 09/09/2016  . History of hypertension [Z86.79] 07/31/2016  . H/O ulcer disease [Z87.898] 07/31/2016    Past Medical History Past Medical History:  Diagnosis Date  . Allergy   . Anemia   . Diverticulosis   . Gall bladder stones 1993  . Gastric ulcer   . GERD (gastroesophageal reflux disease)   . Hypertension   . Renal mass     Past Surgical History Past Surgical History:  Procedure Laterality Date  . ABDOMINAL HYSTERECTOMY    . CHOLECYSTECTOMY      Family  History Family History  Problem Relation Age of Onset  . Cancer Maternal Uncle        Lung  . Cancer Maternal Uncle        Lung  . Cancer Maternal Uncle        Lung     Social History  reports that she has been smoking cigarettes. She has a 6.25 pack-year smoking history. She has never used smokeless tobacco. She reports previous alcohol use. She reports that she does not use drugs.  Medications  Current Outpatient Medications:  .  amLODipine-benazepril (LOTREL) 10-40 MG capsule, Take 1 capsule by mouth daily for 30 days., Disp: 30 capsule, Rfl: 11 .  busPIRone (BUSPAR) 10 MG tablet, Take 1 tablet (10 mg total) by mouth 2 (two) times daily., Disp: 60 tablet, Rfl: 1 .  esomeprazole (NEXIUM) 40 MG capsule, Take 1 capsule (40 mg total) by mouth 2 (two) times daily before a meal., Disp: 60 capsule, Rfl: 3 .  famotidine (PEPCID) 20  MG tablet, Take 1 tablet (20 mg total) by mouth 2 (two) times daily., Disp: 60 tablet, Rfl: 3 .  loratadine-pseudoephedrine (CLARITIN-D 24 HOUR) 10-240 MG 24 hr tablet, Take 1 tablet by mouth daily., Disp: 14 tablet, Rfl: 0 .  sucralfate (CARAFATE) 1 g tablet, Take 1 tablet (1 g total) by mouth 4 (four) times daily., Disp: 120 tablet, Rfl: 3 .  traZODone (DESYREL) 50 MG tablet, Take 2 tablets (100 mg total) by mouth at bedtime., Disp: 60 tablet, Rfl: 3  Allergies Ambien [zolpidem tartrate] and Hydroxyzine  Review of Systems Review of Systems - Oncology ROS negative other than dysuria, and abdominal pain.     Physical Exam  Vitals Wt Readings from Last 3 Encounters:  07/07/18 177 lb 8 oz (80.5 kg)  06/07/18 178 lb 3.2 oz (80.8 kg)  06/03/18 179 lb (81.2 kg)   Temp Readings from Last 3 Encounters:  07/07/18 98.6 F (37 C) (Oral)  06/07/18 (!) 97.5 F (36.4 C) (Oral)  06/03/18 97.9 F (36.6 C) (Oral)   BP Readings from Last 3 Encounters:  07/07/18 113/70  06/07/18 102/70  06/03/18 111/65   Pulse Readings from Last 3 Encounters:  07/07/18 90   06/07/18 94  06/03/18 75   Constitutional: Well-developed, well-nourished, and in no distress.   HENT: Head: Normocephalic and atraumatic.  Mouth/Throat: No oropharyngeal exudate. Mucosa moist. Eyes: Pupils are equal, round, and reactive to light. Conjunctivae are normal. No scleral icterus.  Neck: Normal range of motion. Neck supple. No JVD present.  Cardiovascular: Normal rate, regular rhythm and normal heart sounds.  Exam reveals no gallop and no friction rub.   No murmur heard. Pulmonary/Chest: Effort normal and breath sounds normal. No respiratory distress. No wheezes.No rales.  Abdominal: Soft. Bowel sounds are normal. Mild distension.  Tender in epigastric region.  There is no guarding.  Musculoskeletal: No edema or tenderness.  Lymphadenopathy: No cervical,axillary or supraclavicular adenopathy.  Neurological: Alert and oriented to person, place, and time. No cranial nerve deficit.  Skin: Skin is warm and dry. No rash noted. No erythema. No pallor.  Psychiatric: Affect and judgment normal.   Labs No visits with results within 3 Day(s) from this visit.  Latest known visit with results is:  Appointment on 06/30/2018  Component Date Value Ref Range Status  . Color, UA 06/30/2018 yellow  yellow Final  . Clarity, UA 06/30/2018 turbid* clear Final  . Glucose, UA 06/30/2018 negative  negative mg/dL Final  . Bilirubin, UA 06/30/2018 negative  negative Final  . Ketones, POC UA 06/30/2018 negative  negative mg/dL Final  . Spec Grav, UA 06/30/2018 1.015  1.010 - 1.025 Final  . Blood, UA 06/30/2018 moderate* negative Final  . pH, UA 06/30/2018 5.5  5.0 - 8.0 Final  . POC PROTEIN,UA 06/30/2018 =100* negative, trace Final  . Urobilinogen, UA 06/30/2018 0.2  0.2 or 1.0 E.U./dL Final  . Nitrite, UA 06/30/2018 Positive* Negative Final  . Leukocytes, UA 06/30/2018 Moderate (2+)* Negative Final  . Urine Culture, Routine 06/30/2018 Final report*  Final  . Organism ID, Bacteria 06/30/2018  Escherichia coli*  Final   Comment: Greater than 100,000 colony forming units per mL Cefazolin <=4 ug/mL Cefazolin with an MIC <=16 predicts susceptibility to the oral agents cefaclor, cefdinir, cefpodoxime, cefprozil, cefuroxime, cephalexin, and loracarbef when used for therapy of uncomplicated urinary tract infections due to E. coli, Klebsiella pneumoniae, and Proteus mirabilis.   [name] Antimicrobial Susceptibility 06/30/2018 Comment   Final   Comment:       **  S = Susceptible; I = Intermediate; R = Resistant **                    P = Positive; N = Negative             MICS are expressed in micrograms per mL    Antibiotic                 RSLT#1    RSLT#2    RSLT#3    RSLT#4 Amoxicillin/Clavulanic Acid    S Ampicillin                     S Cefepime                       S Ceftriaxone                    S Cefuroxime                     S Ciprofloxacin                  S Ertapenem                      S Gentamicin                     S Imipenem                       S Levofloxacin                   S Meropenem                      S Nitrofurantoin                 S Piperacillin/Tazobactam        S Tetracycline                   S Tobramycin                     S Trimethoprim/Sulfa             S      Pathology Orders Placed This Encounter  Procedures  . CBC with Differential (Cancer Center Only)    Standing Status:   Future    Standing Expiration Date:   07/07/2019  . CMP ([place] only)    Standing Status:   Future    Standing Expiration Date:   07/07/2019  . Lactate dehydrogenase (LDH)    Standing Status:   Future    Standing Expiration Date:   07/07/2019  . Sedimentation rate    Standing Status:   Future    Standing Expiration Date:   07/07/2019  . Iron and TIBC    Standing Status:   Future    Standing Expiration Date:   07/07/2019  . Ferritin    Standing Status:   Future    Standing Expiration Date:   07/07/2019       Zoila Shutter MD

## 2018-07-08 ENCOUNTER — Telehealth: Payer: Self-pay | Admitting: Internal Medicine

## 2018-07-08 ENCOUNTER — Ambulatory Visit: Payer: Medicare Other | Attending: Family Medicine | Admitting: Licensed Clinical Social Worker

## 2018-07-08 DIAGNOSIS — F331 Major depressive disorder, recurrent, moderate: Secondary | ICD-10-CM

## 2018-07-08 NOTE — Telephone Encounter (Signed)
Called regarding schedule °

## 2018-07-09 ENCOUNTER — Other Ambulatory Visit: Payer: Self-pay

## 2018-07-13 NOTE — BH Specialist Note (Signed)
Integrated Behavioral Health Visit via Telemedicine (Telephone)  07/08/2018 Natalie Edwards [id number]   Session Start time: 3:05 PM  Session End time: 3:50 PM Total time: 45 minutes  Referring Provider: FNP Kenton Kingfisher Type of Visit: Telephonic Patient location: Home [facility] Provider location: Office All persons participating in visit: Pt  Confirmed patient's address: Yes  Confirmed patient's phone number: Yes  Any changes to demographics: No   Confirmed patient's insurance: Yes  Any changes to patient's insurance: No   Discussed confidentiality: Yes    The following statements were read to the patient and/or legal guardian that are established with the [facility] Provider.  "The purpose of this phone visit is to provide behavioral health care while limiting exposure to the coronavirus (COVID19).  There is a possibility of technology failure and discussed alternative modes of communication if that failure occurs."  "By engaging in this telephone visit, you consent to the provision of healthcare.  Additionally, you authorize for your insurance to be billed for the services provided during this telephone visit."   Patient and/or legal guardian consented to telephone visit: Yes   PRESENTING CONCERNS: Patient and/or family reports the following symptoms/concerns: Pt reports hx of having difficulty managing mental health. She has seen improvement in communicating needs with loved ones and controlling episodes of anger since initiating virtual psychotherapy Duration of problem: Ongoing; Severity of problem: moderate  STRENGTHS (Protective Factors/Coping Skills): Pt has good insight Pt utilizes healthy coping skills  GOALS ADDRESSED: Patient will: 1.  Reduce symptoms of: agitation, anxiety and depression  2.  Increase knowledge and/or ability of: self-management skills  3.  Demonstrate ability to: Increase healthy adjustment to current life circumstances and Increase motivation to  adhere to plan of care  INTERVENTIONS: Interventions utilized:  Supportive Counseling Standardized Assessments completed: Not Needed  ASSESSMENT: Patient currently experiencingdepression and anxiety triggered by ongoing psychosocial stressors.Pt denies currentsuicidal/homicidal ideations. She reports improvement in communicating needs with loved ones and controlling episodes of anger since initiating virtual psychotherapy  Patientshared that since being at home, she has utilized time reflecting on her wants and needs. Pt is interested in obtaining employment. LCSWA commended pt on utilizing extra time in a healthy manner and discussed strategies to foster balance. Pt has been complying with medication management and reports no additional concerns.   PLAN: 1. Follow up with behavioral health clinician on : Pt was encouraged to contact LCSWA if symptoms worsen or fail to improve to schedule behavioral appointments at [facility]. 2. Behavioral recommendations: LCSWA recommends that pt apply healthy coping skills discussed. Pt is encouraged to schedule follow up appointment with LCSWA 3. Referral(s): [place] (In Clinic)  Rebekah [place], [place] 07/19/2018 11:34 AM

## 2018-07-28 ENCOUNTER — Inpatient Hospital Stay: Payer: Medicare Other | Attending: Internal Medicine

## 2018-07-28 ENCOUNTER — Other Ambulatory Visit: Payer: Self-pay

## 2018-07-28 DIAGNOSIS — K921 Melena: Secondary | ICD-10-CM | POA: Insufficient documentation

## 2018-07-28 DIAGNOSIS — D72828 Other elevated white blood cell count: Secondary | ICD-10-CM

## 2018-07-28 DIAGNOSIS — D473 Essential (hemorrhagic) thrombocythemia: Secondary | ICD-10-CM | POA: Insufficient documentation

## 2018-07-28 DIAGNOSIS — D72829 Elevated white blood cell count, unspecified: Secondary | ICD-10-CM | POA: Insufficient documentation

## 2018-07-28 DIAGNOSIS — I10 Essential (primary) hypertension: Secondary | ICD-10-CM | POA: Insufficient documentation

## 2018-07-28 DIAGNOSIS — D75839 Thrombocytosis, unspecified: Secondary | ICD-10-CM

## 2018-07-28 DIAGNOSIS — Z720 Tobacco use: Secondary | ICD-10-CM | POA: Diagnosis not present

## 2018-07-28 DIAGNOSIS — D509 Iron deficiency anemia, unspecified: Secondary | ICD-10-CM | POA: Diagnosis not present

## 2018-07-28 LAB — LACTATE DEHYDROGENASE: LDH: 163 U/L (ref 98–192)

## 2018-07-28 LAB — CMP (CANCER CENTER ONLY)
ALT: 10 U/L (ref 0–44)
AST: 10 U/L — ABNORMAL LOW (ref 15–41)
Albumin: 4.1 g/dL (ref 3.5–5.0)
Alkaline Phosphatase: 63 U/L (ref 38–126)
Anion gap: 9 (ref 5–15)
BUN: 13 mg/dL (ref 6–20)
CO2: 29 mmol/L (ref 22–32)
Calcium: 9.6 mg/dL (ref 8.9–10.3)
Chloride: 103 mmol/L (ref 98–111)
Creatinine: 1.13 mg/dL — ABNORMAL HIGH (ref 0.44–1.00)
GFR, Est AFR Am: >60 mL/min (ref >60)
GFR, Estimated: 55 mL/min — ABNORMAL LOW (ref >60)
Glucose, Bld: 82 mg/dL (ref 70–99)
Potassium: 4.1 mmol/L (ref 3.5–5.1)
Sodium: 141 mmol/L (ref 135–145)
Total Bilirubin: <0.2 mg/dL — ABNORMAL LOW (ref 0.3–1.2)
Total Protein: 7.9 g/dL (ref 6.5–8.1)

## 2018-07-28 LAB — IRON AND TIBC
Iron: 34 ug/dL — ABNORMAL LOW (ref 41–142)
Saturation Ratios: 10 % — ABNORMAL LOW (ref 21–57)
TIBC: 339 ug/dL (ref 236–444)
UIBC: 305 ug/dL (ref 120–384)

## 2018-07-28 LAB — CBC WITH DIFFERENTIAL (CANCER CENTER ONLY)
Abs Immature Granulocytes: 0.04 10*3/uL (ref 0.00–0.07)
Basophils Absolute: 0 10*3/uL (ref 0.0–0.1)
Basophils Relative: 0 %
Eosinophils Absolute: 0.2 10*3/uL (ref 0.0–0.5)
Eosinophils Relative: 2 %
HCT: 36.7 % (ref 36.0–46.0)
Hemoglobin: 11.3 g/dL — ABNORMAL LOW (ref 12.0–15.0)
Immature Granulocytes: 0 %
Lymphocytes Relative: 43 %
Lymphs Abs: 5.4 10*3/uL — ABNORMAL HIGH (ref 0.7–4.0)
MCH: 27.2 pg (ref 26.0–34.0)
MCHC: 30.8 g/dL (ref 30.0–36.0)
MCV: 88.2 fL (ref 80.0–100.0)
Monocytes Absolute: 1 10*3/uL (ref 0.1–1.0)
Monocytes Relative: 8 %
Neutro Abs: 6 10*3/uL (ref 1.7–7.7)
Neutrophils Relative %: 47 %
Platelet Count: 455 10*3/uL — ABNORMAL HIGH (ref 150–400)
RBC: 4.16 MIL/uL (ref 3.87–5.11)
RDW: 16.1 % — ABNORMAL HIGH (ref 11.5–15.5)
WBC Count: 12.8 10*3/uL — ABNORMAL HIGH (ref 4.0–10.5)
nRBC: 0 % (ref 0.0–0.2)

## 2018-07-28 LAB — SEDIMENTATION RATE: Sed Rate: 16 mm/hr (ref 0–22)

## 2018-07-28 LAB — FERRITIN: Ferritin: 23 ng/mL (ref 11–307)

## 2018-08-04 ENCOUNTER — Inpatient Hospital Stay (HOSPITAL_BASED_OUTPATIENT_CLINIC_OR_DEPARTMENT_OTHER): Payer: Medicare Other | Admitting: Internal Medicine

## 2018-08-04 ENCOUNTER — Ambulatory Visit: Payer: Medicare Other | Admitting: Physician Assistant

## 2018-08-04 ENCOUNTER — Other Ambulatory Visit: Payer: Self-pay

## 2018-08-04 ENCOUNTER — Other Ambulatory Visit: Payer: Self-pay | Admitting: Internal Medicine

## 2018-08-04 VITALS — BP 113/72 | HR 101 | Temp 98.0°F | Resp 18 | Ht 63.0 in | Wt 179.1 lb

## 2018-08-04 DIAGNOSIS — K921 Melena: Secondary | ICD-10-CM | POA: Diagnosis not present

## 2018-08-04 DIAGNOSIS — D473 Essential (hemorrhagic) thrombocythemia: Secondary | ICD-10-CM | POA: Diagnosis not present

## 2018-08-04 DIAGNOSIS — D509 Iron deficiency anemia, unspecified: Secondary | ICD-10-CM | POA: Diagnosis not present

## 2018-08-04 DIAGNOSIS — D72829 Elevated white blood cell count, unspecified: Secondary | ICD-10-CM | POA: Diagnosis not present

## 2018-08-04 DIAGNOSIS — I10 Essential (primary) hypertension: Secondary | ICD-10-CM

## 2018-08-04 DIAGNOSIS — D500 Iron deficiency anemia secondary to blood loss (chronic): Secondary | ICD-10-CM

## 2018-08-04 DIAGNOSIS — Z720 Tobacco use: Secondary | ICD-10-CM

## 2018-08-04 NOTE — Progress Notes (Signed)
Diagnosis Iron deficiency anemia due to chronic blood loss - Plan: CBC with Differential ([place] Only), CMP ([place] only), Lactate dehydrogenase (LDH), Ferritin, Ambulatory referral to Gastroenterology  Staging Cancer Staging No matching staging information was found for the patient.  Assessment and Plan   1.  Leucocytosis.  53 year old fermale referred for evaluation due to leucocytosis and thrombocytosis.  Pt was diagnosed with UTI on 06/30/2018.  She just completed abx 3 days ago.  She has more than 20 year history of smoking.  Pt had labs done 05/31/2018 that showed WBC 13.6 HB 12.5 plts 570,000.  Labs done 06/03/2018 showed WBC 15.9 HB 12.4 plts 557,000.  Chemistries WNL with K+ 3.7 Cr 1.25.  Pt denies fevers, chills, night sweats and has noted no adenopathy.  She reports occasional hot flashes.  Pt was previously seen by Dr. Lebron Conners 09/23/2016 for same condition.  She had flow cytometry done 09/09/2016 that was negative.  Pt denies any family history of leukemias or lymphomas.  She reports abdominal pain and has been seen by GI for this.    Prior work-up by Dr. Lebron Conners was negative.  Review of records show his evaluation done 09/23/2016 showed:  "53 year old female initially referred to our clinic for evaluation of leukocytosis and thrombocytosis. During evaluation mild normochromic anemia was identified. Additional testing demonstrated no evidence of dysmorphic red blood cells, white blood cells, or platelets. Iron panel demonstrates mild to moderate iron deficiency. Reported EGD demonstrated multifocal gastric ulcers likely the source of the blood loss and reactive leukocytosis and thrombocytosis.  At this time, I will defer management of the gastric ulcers to the managing gastroenterologist."    I discussed with her possible etiology of WBC elevation may be due to smoking, infections.  She was previously diagnosed with UTI which could explain WBC elevation.  Pt has completed abx.     Labs done 07/28/2018 reviewed and showed WBC 12.8 HB 11.3 plts 455,000.  WBC is improved from 15,000 on labs done in 05/2018.    I suspect elevation due to reactive process.  Will repeat labs in 08/2018.   If WBC and plt count remains elevated on labs done in 08/2018, will check BCR/ABL and Jak 2.   2.  Thrombocytosis.  Dr. Lebron Conners felt plt count was reactive due to iron deficiency anemia( IDA).  Ferritin was low normal at 23 on labs done 09/09/2016 which is consistent with IDA.    Labs done 07/28/2018 showed plt count of 455,000.  Ferritin was decreased at 23.  Pt will be treated with IV iron and will have repeat labs done in 08/2018.  If no improvement on labs done 08/2018 will consider BCR/ABL and Jak 2 testing.    3.  Iron deficiency anemia, ( IDA).  Labs done 07/28/2018 showed HB 11.3 ferritin decreased at 23.  Findings are consistent with IDA.  Pt has an intolerance to oral iron.  She is recommended for IV iron with Feraheme 510 mg IV D1 and D8.  Side effects of IV iron reviewed with pt and include an allergic reaction.  Pt will have repeat labs in 08/2018.  She is referred to GI for evaluation.    4.  Rectal bleeding.  Pt has IDA and reported history of ulcers.  Pt is referred to GI for evaluation.    5.  Abdominal pain.  This is chronic.  Pt had CT abdomen and pelvis done 07/31/2016 that showed  IMPRESSION: 1. Apparent wall thickening of the gastric  antrum suggesting gastritis. This may, alternatively, be due to lack of distension. Recommend follow-up upper GI or endoscopy on a nonemergent basis. 2. No other evidence of an acute abnormality within the abdomen or pelvis. 3. Left adrenal adenoma. 4. Bilateral low-density renal masses consistent with cysts, some too small to fully characterize. 5. Colonic diverticula without evidence of diverticulitis. 6. Aortic atherosclerosis. 7. Status post cholecystectomy and hysterectomy.  Pt had EGD done 09/12/2016 that showed reactive gastric mucosa but  no evidence of malignancy.    Will refer to GI due to ongoing symptoms.    6.  Hypertension.  BP is 113/72.  Follow-up with PCP for monitoring.    7.  UTI.  Pt was diagnosed with E. Coli UTI on 06/30/2018.  She completed abx.  Likely etiology of elevated WBC.  Pt should follow-up with PCP if urinary symptoms.    8.  Smoking.  Cessation recommended.    9.  Health maintenance.  Pt referred to GI. Mammogram screenings as recommended.    25 minutes spent with more than 50% in review of records, counseling and coordination of care.    Interval history:  Historical data obtained from note dated 07/07/2018.  53 year old fermale referred for evaluation due to leucocytosis and thrombocytosis.  Pt was diagnosed with UTI on 06/30/2018.  She just completed abx 3 days ago.  She has more than 20 year history of smoking.  Pt had labs done 05/31/2018 that showed WBC 13.6 HB 12.5 plts 570,000.  Labs done 06/03/2018 showed WBC 15.9 HB 12.4 plts 557,000.  Chemistries WNL with K+ 3.7 Cr 1.25.  Pt denies fevers, chills, night sweats and has noted no adenopathy.  She reports occasional hot flashes.  Pt was previously seen by Dr. Lebron Conners 09/23/2016 for same condition.  She had flow cytometry done 09/09/2016 that was negative.  Pt denies any family history of leukemias or lymphomas.  She reports abdominal pain and has been seen by GI for this.   Current Status:  Pt is seen today for follow-up to go over labs.  When questioned, she reports blood in stool.    Problem List Patient Active Problem List   Diagnosis Date Noted  . GAD (generalized anxiety disorder) [F41.1] 05/07/2018  . Chronic gastric ulcer [K25.7] 05/07/2018  . Essential hypertension [I10] 05/07/2018  . Iron deficiency anemia due to chronic blood loss [D50.0] 09/23/2016  . Peptic ulcer disease with hemorrhage [K27.4] 09/23/2016  . Thrombocytosis ([place]) [D47.3] 09/09/2016  . Leukocytosis [D72.829] 09/09/2016  . History of hypertension [Z86.79] 07/31/2016  .  H/O ulcer disease [Z87.898] 07/31/2016    Past Medical History Past Medical History:  Diagnosis Date  . Allergy   . Anemia   . Diverticulosis   . Gall bladder stones 1993  . Gastric ulcer   . GERD (gastroesophageal reflux disease)   . Hypertension   . Renal mass     Past Surgical History Past Surgical History:  Procedure Laterality Date  . ABDOMINAL HYSTERECTOMY    . CHOLECYSTECTOMY      Family History Family History  Problem Relation Age of Onset  . Cancer Maternal Uncle        Lung  . Cancer Maternal Uncle        Lung  . Cancer Maternal Uncle        Lung     Social History  reports that she has been smoking cigarettes. She has a 6.25 pack-year smoking history. She has never used smokeless tobacco. She  reports previous alcohol use. She reports that she does not use drugs.  Medications  Current Outpatient Medications:  .  amLODipine-benazepril (LOTREL) 10-40 MG capsule, Take 1 capsule by mouth daily for 30 days., Disp: 30 capsule, Rfl: 11 .  busPIRone (BUSPAR) 10 MG tablet, Take 1 tablet (10 mg total) by mouth 2 (two) times daily., Disp: 60 tablet, Rfl: 1 .  esomeprazole (NEXIUM) 40 MG capsule, Take 1 capsule (40 mg total) by mouth 2 (two) times daily before a meal., Disp: 60 capsule, Rfl: 3 .  famotidine (PEPCID) 20 MG tablet, Take 1 tablet (20 mg total) by mouth 2 (two) times daily., Disp: 60 tablet, Rfl: 3 .  loratadine-pseudoephedrine (CLARITIN-D 24 HOUR) 10-240 MG 24 hr tablet, Take 1 tablet by mouth daily., Disp: 14 tablet, Rfl: 0 .  sucralfate (CARAFATE) 1 g tablet, Take 1 tablet (1 g total) by mouth 4 (four) times daily., Disp: 120 tablet, Rfl: 3 .  traZODone (DESYREL) 50 MG tablet, Take 2 tablets (100 mg total) by mouth at bedtime., Disp: 60 tablet, Rfl: 3  Allergies Ambien [zolpidem tartrate] and Hydroxyzine  Review of Systems Review of Systems - Oncology ROS negative   Physical Exam  Vitals Wt Readings from Last 3 Encounters:  08/04/18 179 lb 1.6  oz (81.2 kg)  07/07/18 177 lb 8 oz (80.5 kg)  06/07/18 178 lb 3.2 oz (80.8 kg)   Temp Readings from Last 3 Encounters:  08/04/18 98 F (36.7 C) (Oral)  07/07/18 98.6 F (37 C) (Oral)  06/07/18 (!) 97.5 F (36.4 C) (Oral)   BP Readings from Last 3 Encounters:  08/04/18 113/72  07/07/18 113/70  06/07/18 102/70   Pulse Readings from Last 3 Encounters:  08/04/18 (!) 101  07/07/18 90  06/07/18 94   Constitutional: Well-developed, well-nourished, and in no distress.   HENT: Head: Normocephalic and atraumatic.  Mouth/Throat: No oropharyngeal exudate. Mucosa moist. Eyes: Pupils are equal, round, and reactive to light. Conjunctivae are normal. No scleral icterus.  Neck: Normal range of motion. Neck supple. No JVD present.  Cardiovascular: Normal rate, regular rhythm and normal heart sounds.  Exam reveals no gallop and no friction rub.   No murmur heard. Pulmonary/Chest: Effort normal and breath sounds normal. No respiratory distress. No wheezes.No rales.  Abdominal: Soft. Scarring noted.   Bowel sounds are normal.  Tenderness to palpation. There is no guarding.  Musculoskeletal: No edema or tenderness.  Lymphadenopathy: No cervical, axillary or supraclavicular adenopathy.  Neurological: Alert and oriented to person, place, and time. No cranial nerve deficit.  Skin: Skin is warm and dry. No rash noted. No erythema. No pallor.  Psychiatric: Affect and judgment normal.   Labs No visits with results within 3 Day(s) from this visit.  Latest known visit with results is:  Appointment on 07/28/2018  Component Date Value Ref Range Status  . Ferritin 07/28/2018 23  11 - 307 ng/mL Final   Performed at [facility], [place] [address] [place], [place] [address]  . Iron 07/28/2018 34* 41 - 142 ug/dL Final  . TIBC 07/28/2018 339  236 - 444 ug/dL Final  . Saturation Ratios 07/28/2018 10* 21 - 57 % Final  . UIBC 07/28/2018 305  120 - 384 ug/dL Final   Performed at [facility], [place] [address] [place],  [address]  . Sed Rate 07/28/2018 16  0 - 22 mm/hr Final   Performed at [facility], [place] [address] [place], [place]  [address]  . LDH 07/28/2018 163  98 - 192 U/L Final   Performed at [facility], [place] [address] [place], [place] [address]  . Sodium 07/28/2018 141  135 - 145 mmol/L Final  . Potassium 07/28/2018 4.1  3.5 - 5.1 mmol/L Final  . Chloride 07/28/2018 103  98 - 111 mmol/L Final  . CO2 07/28/2018 29  22 - 32 mmol/L Final  . Glucose, Bld 07/28/2018 82  70 - 99 mg/dL Final  . BUN 07/28/2018 13  6 - 20 mg/dL Final  . Creatinine 07/28/2018 1.13* 0.44 - 1.00 mg/dL Final  . Calcium 07/28/2018 9.6  8.9 - 10.3 mg/dL Final  . Total Protein 07/28/2018 7.9  6.5 - 8.1 g/dL Final  . Albumin 07/28/2018 4.1  3.5 - 5.0 g/dL Final  . AST 07/28/2018 10* 15 - 41 U/L Final  . ALT 07/28/2018 10  0 - 44 U/L Final  . Alkaline Phosphatase 07/28/2018 63  38 - 126 U/L Final  . Total Bilirubin 07/28/2018 <0.2* 0.3 - 1.2 mg/dL Final  . GFR, Est Non Af Am 07/28/2018 55* >60 mL/min Final  . GFR, Est AFR Am 07/28/2018 >60  >60 mL/min Final  . Anion gap 07/28/2018 9  5 - 15 Final   Performed at [facility], [place] [address], [place], [place] [address]  . WBC Count 07/28/2018 12.8* 4.0 - 10.5 K/uL Final  . RBC 07/28/2018 4.16  3.87 - 5.11 MIL/uL Final  . Hemoglobin 07/28/2018 11.3* 12.0 - 15.0 g/dL Final  . HCT 07/28/2018 36.7  36.0 - 46.0 % Final  . MCV 07/28/2018 88.2  80.0 - 100.0 fL Final  . MCH 07/28/2018 27.2  26.0 - 34.0 pg Final  . MCHC 07/28/2018 30.8  30.0 - 36.0 g/dL Final  . RDW 07/28/2018 16.1* 11.5 - 15.5 % Final  . Platelet Count 07/28/2018 455* 150 - 400 K/uL Final  . nRBC 07/28/2018 0.0  0.0 - 0.2 % Final  . Neutrophils Relative % 07/28/2018 47  % Final  . Neutro Abs 07/28/2018 6.0  1.7 - 7.7 K/uL Final  . Lymphocytes Relative 07/28/2018 43  % Final  .  Lymphs Abs 07/28/2018 5.4* 0.7 - 4.0 K/uL Final  . Monocytes Relative 07/28/2018 8  % Final  . Monocytes Absolute 07/28/2018 1.0  0.1 - 1.0 K/uL Final  . Eosinophils Relative 07/28/2018 2  % Final  . Eosinophils Absolute 07/28/2018 0.2  0.0 - 0.5 K/uL Final  . Basophils Relative 07/28/2018 0  % Final  . Basophils Absolute 07/28/2018 0.0  0.0 - 0.1 K/uL Final  . Immature Granulocytes 07/28/2018 0  % Final  . Abs Immature Granulocytes 07/28/2018 0.04  0.00 - 0.07 K/uL Final   Performed at [facility], [place] [address], [place], [place] [address]     Pathology Orders Placed This Encounter  Procedures  . CBC with Differential ([facility] Only)    Standing Status:   Future    Standing Expiration Date:   08/04/2019  . CMP ([place] only)    Standing Status:   Future    Standing Expiration Date:   08/04/2019  . Lactate dehydrogenase (LDH)    Standing Status:   Future    Standing Expiration Date:   08/04/2019  . Ferritin    Standing Status:   Future    Standing Expiration Date:   08/04/2019  . Ambulatory referral to Gastroenterology    Referral Priority:  Routine    Referral Type:   Consultation    Referral Reason:   Specialty Services Required    Number of Visits Requested:   1       Zoila Shutter MD

## 2018-08-05 ENCOUNTER — Telehealth: Payer: Self-pay | Admitting: Internal Medicine

## 2018-08-05 NOTE — Telephone Encounter (Signed)
Called regarding schedule °

## 2018-08-12 ENCOUNTER — Ambulatory Visit: Payer: Medicare Other

## 2018-08-19 ENCOUNTER — Inpatient Hospital Stay: Payer: Medicare Other

## 2018-08-20 ENCOUNTER — Ambulatory Visit: Payer: Medicare Other

## 2018-08-31 ENCOUNTER — Telehealth (INDEPENDENT_AMBULATORY_CARE_PROVIDER_SITE_OTHER): Payer: Medicare Other | Admitting: Family Medicine

## 2018-08-31 DIAGNOSIS — I10 Essential (primary) hypertension: Secondary | ICD-10-CM | POA: Diagnosis not present

## 2018-08-31 DIAGNOSIS — D75839 Thrombocytosis, unspecified: Secondary | ICD-10-CM

## 2018-08-31 DIAGNOSIS — D72829 Elevated white blood cell count, unspecified: Secondary | ICD-10-CM

## 2018-08-31 DIAGNOSIS — F339 Major depressive disorder, recurrent, unspecified: Secondary | ICD-10-CM | POA: Diagnosis not present

## 2018-08-31 DIAGNOSIS — D473 Essential (hemorrhagic) thrombocythemia: Secondary | ICD-10-CM

## 2018-08-31 DIAGNOSIS — F411 Generalized anxiety disorder: Secondary | ICD-10-CM | POA: Diagnosis not present

## 2018-08-31 NOTE — Progress Notes (Signed)
Virtual Visit via Telephone Note  I connected with Natalie Edwards on 08/31/18 at  1:30 PM EDT by telephone and verified that I am speaking with the correct person using two identifiers.  Location: Patient: Located at home during today's encounter  Provider: Located at primary care office    I discussed the limitations, risks, security and privacy concerns of performing an evaluation and management service by telephone and the availability of in person appointments. I also discussed with the patient that there may be a patient responsible charge related to this service. The patient expressed understanding and agreed to proceed.   History of Present Illness: Natalie Edwards has a history of anxiety and major depressive disorder.  She presents today for follow-up of hypertension via video encounter. Patient reports she is not checking her blood pressure at home. Of note of the last several visits here in office for blood pressure has been very low. Last BP 102/70.  At a prior visit blood only stop the hydrochlorothiazide. She is currently only taking amlodipine-Benzapril for blood pressure control. She denies any weakness or dizziness.  She is a current daily smoker. Denies any engagement in routine physical activity.  Tries to control sodium content in diet.  Kristinia was also recently referred to hematology for evaluation of chronic thrombocytosis leukocytosis. Due to COVID-19 she missed her last follow-up but at present she is not being treated medically.  She plans to reschedule her appointment with hematology.  Denies any uncontrollable bleeding, hematuria, dark tarry stools.  Normal is currently treated for depression and anxiety.  She reports symptoms are still present however medication helping to control both anxiety and insomnia related to anxiety.  She is currently prescribed trazodone 100 mg at at bedtime nightly along with buspirone 10 mg standings.  She is also followed by our Education officer, museum here in  office and has had at least one follow-up counseling session with her.  She intends to schedule another appointment as she found talking was very beneficial.  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  Of recent she has become unemployed and is currently actively seeking employment. Denies suicidal ideations, homicidal ideations, or auditory hallucinations.   Social History   Socioeconomic History  . Marital status: Single    Spouse name: Not on file  . Number of children: 3  . Years of education: Not on file  . Highest education level: Not on file  Occupational History  . Not on file  Social Needs  . Financial resource strain: Not on file  . Food insecurity    Worry: Not on file    Inability: Not on file  . Transportation needs    Medical: Not on file    Non-medical: Not on file  Tobacco Use  . Smoking status: Current Every Day Smoker    Packs/day: 0.25    Years: 25.00    Pack years: 6.25    Types: Cigarettes  . Smokeless tobacco: Never Used  . Tobacco comment: Pt tried to quit - helps with anxiety   Substance and Sexual Activity  . Alcohol use: Not Currently  . Drug use: No  . Sexual activity: Yes    Comment: hysterectomy  Lifestyle  . Physical activity    Days per week: Not on file    Minutes per session: Not on file  . Stress: Not on file  Relationships  . Social connections    Talks on phone: Not on file  Gets together: Not on file    Attends religious service: Not on file    Active member of club or organization: Not on file    Attends meetings of clubs or organizations: Not on file    Relationship status: Not on file  . Intimate partner violence    Fear of current or ex partner: Not on file    Emotionally abused: Not on file    Physically abused: Not on file    Forced sexual activity: Not on file  Other Topics Concern  . Not on file  Social History Narrative  . Not on file   Assessment and Plan: 1.  Depression, recurrent ([place]) 2. GAD (generalized anxiety disorder) Currently active symptoms moderately controlled She will continue buspirone along with trazodone.  Encouraged follow-up with Christa See upon her return to office.  Patient verbalized understanding.  3. Essential hypertension, uncertain of control however recent readings have been well controlled and soft.  No changes today we will continue to monitor in clinic.  Advised patient if she does develop any fatigue or dizziness to schedule a follow-up as this could be related to low blood pressure readings.  4. Thrombocytosis (HCC) 5. Leukocytosis, unspecified type Followed by hematology  Follow Up Instructions: Return for follow-up in 6 months, hypertension follow-up and lipid recheck    I discussed the assessment and treatment plan with the patient. The patient was provided an opportunity to ask questions and all were answered. The patient agreed with the plan and demonstrated an understanding of the instructions.   The patient was advised to call back or seek an in-person evaluation if the symptoms worsen or if the condition fails to improve as anticipated.  I provided 20 minutes of non-face-to-face time during this encounter.   Molli Barrows, FNP -C

## 2018-09-08 ENCOUNTER — Inpatient Hospital Stay: Payer: Medicare Other | Attending: Internal Medicine | Admitting: Internal Medicine

## 2018-09-08 ENCOUNTER — Inpatient Hospital Stay: Payer: Medicare Other

## 2018-10-13 ENCOUNTER — Ambulatory Visit: Payer: Medicare Other | Admitting: Podiatry

## 2018-10-14 ENCOUNTER — Ambulatory Visit: Payer: Medicare Other | Admitting: Podiatry

## 2018-10-23 ENCOUNTER — Other Ambulatory Visit: Payer: Self-pay | Admitting: Gastroenterology

## 2018-10-24 ENCOUNTER — Other Ambulatory Visit: Payer: Self-pay | Admitting: Gastroenterology

## 2018-11-13 ENCOUNTER — Other Ambulatory Visit: Payer: Self-pay | Admitting: Family Medicine

## 2018-11-17 ENCOUNTER — Other Ambulatory Visit: Payer: Self-pay

## 2018-11-17 ENCOUNTER — Ambulatory Visit (INDEPENDENT_AMBULATORY_CARE_PROVIDER_SITE_OTHER): Payer: Medicare Other

## 2018-11-17 ENCOUNTER — Encounter: Payer: Self-pay | Admitting: Podiatry

## 2018-11-17 ENCOUNTER — Ambulatory Visit (INDEPENDENT_AMBULATORY_CARE_PROVIDER_SITE_OTHER): Payer: Medicare Other | Admitting: Podiatry

## 2018-11-17 ENCOUNTER — Other Ambulatory Visit: Payer: Self-pay | Admitting: Podiatry

## 2018-11-17 DIAGNOSIS — M19072 Primary osteoarthritis, left ankle and foot: Secondary | ICD-10-CM

## 2018-11-17 DIAGNOSIS — M25572 Pain in left ankle and joints of left foot: Secondary | ICD-10-CM

## 2018-11-17 DIAGNOSIS — M79672 Pain in left foot: Secondary | ICD-10-CM | POA: Diagnosis not present

## 2018-11-18 NOTE — Progress Notes (Signed)
Subjective:   Patient ID: Natalie Edwards, female   DOB: 53 y.o.   MRN: [id number]   HPI Patient presents stating she is had a lot of pain in her left ankle that is been going on for a long time and worse over the last few months.  It swells and pops and states that it is become gradually more of an issue as time goes on and she is tried to reduce activity.  Patient smokes quarter pack per day and would like to be more active   Review of Systems  All other systems reviewed and are negative.       Objective:  Physical Exam Vitals signs and nursing note reviewed.  Constitutional:      Appearance: She is well-developed.  Pulmonary:     Effort: Pulmonary effort is normal.  Musculoskeletal: Normal range of motion.  Skin:    General: Skin is warm.  Neurological:     Mental Status: She is alert.     Neurovascular status intact muscle strength found to be adequate range of motion within normal limits with patient noted to have exquisite discomfort of the sinus tarsi left with inflammation fluid and nail disease bilateral that can become bothersome.  Patient is noted to have good digital perfusion well oriented x3 and has moderate limitation of motion of the subtalar ankle joint     Assessment:  Sinus tarsitis left with inflammation fluid along with nail disease and inflammatory type ankle condition     Plan:  H&P x-ray reviewed with patient in today sterile prep done and then injected the sinus tarsi left 3 mg Kenalog 5 mg Xylocaine applied fascial brace to provide support.  Reappoint to recheck again in 4 weeks to Dr. Posey Pronto and will be seen back earlier if any issues were to occur  X-rays indicate there is mild arthritis with no indications of other pathology

## 2018-11-19 ENCOUNTER — Other Ambulatory Visit: Payer: Self-pay | Admitting: Gastroenterology

## 2018-12-02 ENCOUNTER — Telehealth: Payer: Self-pay

## 2018-12-02 NOTE — Telephone Encounter (Signed)
Called patient to do their pre-visit COVID screening.  Call went to voicemail. Unable to do prescreening.  

## 2018-12-06 ENCOUNTER — Ambulatory Visit (INDEPENDENT_AMBULATORY_CARE_PROVIDER_SITE_OTHER): Payer: Medicare Other | Admitting: Family Medicine

## 2018-12-06 ENCOUNTER — Other Ambulatory Visit: Payer: Self-pay

## 2018-12-06 VITALS — BP 104/72 | HR 89 | Temp 97.3°F | Resp 17 | Ht 63.0 in | Wt 170.6 lb

## 2018-12-06 DIAGNOSIS — R519 Headache, unspecified: Secondary | ICD-10-CM

## 2018-12-06 DIAGNOSIS — K254 Chronic or unspecified gastric ulcer with hemorrhage: Secondary | ICD-10-CM

## 2018-12-06 DIAGNOSIS — R51 Headache: Secondary | ICD-10-CM

## 2018-12-06 DIAGNOSIS — J309 Allergic rhinitis, unspecified: Secondary | ICD-10-CM | POA: Diagnosis not present

## 2018-12-06 DIAGNOSIS — D500 Iron deficiency anemia secondary to blood loss (chronic): Secondary | ICD-10-CM

## 2018-12-06 DIAGNOSIS — F331 Major depressive disorder, recurrent, moderate: Secondary | ICD-10-CM

## 2018-12-06 DIAGNOSIS — R5383 Other fatigue: Secondary | ICD-10-CM | POA: Diagnosis not present

## 2018-12-06 DIAGNOSIS — I10 Essential (primary) hypertension: Secondary | ICD-10-CM | POA: Diagnosis not present

## 2018-12-06 DIAGNOSIS — Z79899 Other long term (current) drug therapy: Secondary | ICD-10-CM

## 2018-12-06 DIAGNOSIS — F411 Generalized anxiety disorder: Secondary | ICD-10-CM

## 2018-12-06 MED ORDER — QUETIAPINE FUMARATE 25 MG PO TABS: 25.0000 mg | ORAL_TABLET | Freq: Every day | ORAL | 3 refills | Status: DC

## 2018-12-06 MED ORDER — FLUTICASONE PROPIONATE 50 MCG/ACT NA SUSP: 2.0000 | Freq: Every day | NASAL | 6 refills | Status: DC

## 2018-12-06 MED ORDER — LORATADINE 10 MG PO TABS: 10.0000 mg | ORAL_TABLET | Freq: Every day | ORAL | 11 refills | Status: DC

## 2018-12-06 NOTE — Progress Notes (Deleted)
Established Patient Office Visit  Subjective:  Patient ID: Natalie Edwards, female    DOB: 1965/06/10  Age: 53 y.o. MRN: [id number]  CC:  Chief Complaint  Patient presents with  . Migraine    HPI Latifah Yoders presents for complaint of daily frontal headaches- aching- and about an 8 on a 0-10 scale. She feels as if she wakes up with a headaChe. She takes 2 Tylenol several times per day which takes her headache down to a 4. She takes her BP medication daily and her BP has been controlled. Patient states that she has difficulty sleeping and if she takes more than one she feels jittery. She also thinks that she needs an eye exam. She has occasional blurred vision. She thinks that stress might also be playing a role in her headaches.   Past Medical History:  Diagnosis Date  . Allergy   . Anemia   . Diverticulosis   . Gall bladder stones 1993  . Gastric ulcer   . GERD (gastroesophageal reflux disease)   . Hypertension   . Renal mass     Past Surgical History:  Procedure Laterality Date  . ABDOMINAL HYSTERECTOMY    . CHOLECYSTECTOMY      Family History  Problem Relation Age of Onset  . Cancer Maternal Uncle        Lung  . Cancer Maternal Uncle        Lung  . Cancer Maternal Uncle        Lung    Social History   Socioeconomic History  . Marital status: Single    Spouse name: Not on file  . Number of children: 3  . Years of education: Not on file  . Highest education level: Not on file  Occupational History  . Not on file  Social Needs  . Financial resource strain: Not on file  . Food insecurity    Worry: Not on file    Inability: Not on file  . Transportation needs    Medical: Not on file    Non-medical: Not on file  Tobacco Use  . Smoking status: Current Every Day Smoker    Packs/day: 0.25    Years: 25.00    Pack years: 6.25    Types: Cigarettes  . Smokeless tobacco: Never Used  . Tobacco comment: Pt tried to quit - helps with anxiety   Substance and Sexual  Activity  . Alcohol use: Not Currently  . Drug use: No  . Sexual activity: Yes    Comment: hysterectomy  Lifestyle  . Physical activity    Days per week: Not on file    Minutes per session: Not on file  . Stress: Not on file  Relationships  . Social Herbalist on phone: Not on file    Gets together: Not on file    Attends religious service: Not on file    Active member of club or organization: Not on file    Attends meetings of clubs or organizations: Not on file    Relationship status: Not on file  . Intimate partner violence    Fear of current or ex partner: Not on file    Emotionally abused: Not on file    Physically abused: Not on file    Forced sexual activity: Not on file  Other Topics Concern  . Not on file  Social History Narrative  . Not on file    Outpatient Medications Prior to Visit  Medication Sig  Dispense Refill  . amLODipine-benazepril (LOTREL) 10-40 MG capsule Take 1 capsule by mouth daily for 30 days. 30 capsule 11  . busPIRone (BUSPAR) 10 MG tablet Take 1 tablet (10 mg total) by mouth 2 (two) times daily. 60 tablet 1  . esomeprazole (NEXIUM) 40 MG capsule TAKE 1 CAPSULE BY MOUTH 2 TIMES DAILY BEFORE A MEAL. 60 capsule 3  . sucralfate (CARAFATE) 1 g tablet Take 1 tablet (1 g total) by mouth 4 (four) times daily. 120 tablet 3  . traZODone (DESYREL) 50 MG tablet TAKE 2 TABLETS (100 MG TOTAL) BY MOUTH AT BEDTIME. 180 tablet 0  . famotidine (PEPCID) 20 MG tablet TAKE 1 TABLET BY MOUTH TWICE A DAY 60 tablet 3   No facility-administered medications prior to visit.     Allergies  Allergen Reactions  . Ambien [Zolpidem Tartrate]     Jitteriness, nervousness, abdominal pain  . Hydroxyzine     Palpitations and jitteriness     ROS Review of Systems  Constitutional: Positive for fatigue. Negative for chills and fever.  HENT: Positive for congestion.   Eyes: Positive for visual disturbance. Negative for photophobia.  Respiratory: Negative for cough  and shortness of breath.   Gastrointestinal: Positive for nausea (occasional with headaches). Negative for abdominal pain (controlled with medication; history of ulcers), blood in stool, constipation and diarrhea.  Endocrine: Positive for cold intolerance. Negative for heat intolerance, polydipsia, polyphagia and polyuria.  Genitourinary: Negative for dysuria and frequency.  Musculoskeletal: Negative for arthralgias, back pain and gait problem.  Neurological: Positive for dizziness (occasional mild) and headaches.  Hematological: Negative for adenopathy. Does not bruise/bleed easily.      Objective:    Physical Exam  BP 104/72   Pulse 89   Temp (!) 97.3 F (36.3 C) (Temporal)   Resp 17   Ht 5\' 3"  (1.6 m)   Wt 170 lb 9.6 oz (77.4 kg)   SpO2 98%   BMI 30.22 kg/m  Wt Readings from Last 3 Encounters:  12/06/18 170 lb 9.6 oz (77.4 kg)  08/04/18 179 lb 1.6 oz (81.2 kg)  07/07/18 177 lb 8 oz (80.5 kg)     Health Maintenance Due  Topic Date Due  . MAMMOGRAM  10/21/2014    There are no preventive care reminders to display for this patient.  Lab Results  Component Value Date   TSH 0.782 05/31/2018   Lab Results  Component Value Date   WBC 12.8 (H) 07/28/2018   HGB 11.3 (L) 07/28/2018   HCT 36.7 07/28/2018   MCV 88.2 07/28/2018   PLT 455 (H) 07/28/2018   Lab Results  Component Value Date   NA 141 07/28/2018   K 4.1 07/28/2018   CO2 29 07/28/2018   GLUCOSE 82 07/28/2018   BUN 13 07/28/2018   CREATININE 1.13 (H) 07/28/2018   BILITOT <0.2 (L) 07/28/2018   ALKPHOS 63 07/28/2018   AST 10 (L) 07/28/2018   ALT 10 07/28/2018   PROT 7.9 07/28/2018   ALBUMIN 4.1 07/28/2018   CALCIUM 9.6 07/28/2018   ANIONGAP 9 07/28/2018   Lab Results  Component Value Date   CHOL 184 05/31/2018   Lab Results  Component Value Date   HDL 55 05/31/2018   Lab Results  Component Value Date   LDLCALC 104 (H) 05/31/2018   Lab Results  Component Value Date   TRIG 123 05/31/2018    Lab Results  Component Value Date   CHOLHDL 3.3 05/31/2018   Lab Results  Component Value  Date   HGBA1C 5.7 (H) 05/31/2018      Assessment & Plan:   Problem List Items Addressed This Visit    None      No orders of the defined types were placed in this encounter.   Follow-up: No follow-ups on file.    Antony Blackbird, MD

## 2018-12-06 NOTE — Progress Notes (Signed)
Established Patient Office Visit  Subjective:  Patient ID: Natalie Edwards, female    DOB: [birth year]  Age: 53 y.o. MRN: [id number]  CC:  Chief Complaint  Patient presents with  . Migraine    HPI Natalie Edwards presents for complaint of daily frontal headaches- aching- and about an 8 on a 0-10 scale. She feels as if she wakes up with a headache. She takes 2 Tylenol several times per day which takes her headache down to a 4. She takes her BP medication daily and her BP has been controlled. Patient states that she has difficulty sleeping and if she takes more than one trazodone she feels jittery.  She also had sensation of being jittery when she was on zolpidem in the past as well as hydroxyzine and had to discontinue these 2 medications.  She also thinks that she needs an eye exam. She has occasional blurred vision. She thinks that stress might also be playing a role in her headaches.  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  She reports that her mother has caregivers during the day but then after work, patient is to provide any remaining care for her mother including getting her mother ready for bed each night.        Patient reports continued issues with anxiety and depression.  She is taking her BuSpar but only takes this once daily when she takes her blood pressure medication.  She reports that she is compliant with her blood pressure medication.  She denies headaches or dizziness related to her blood pressure.  She reports that her current final daily headache has been ongoing for months.  She is occasionally awakened from sleep due to headache.  She denies any loss of balance or falls related to her headache.  She reports that she does not get adequate sleep.  When she takes trazodone to help with sleep, this does help her fall asleep and then she will awaken shortly afterwards and be awake for the rest of the night.  She reports that  about 4 years ago when she lived in [place], she was on temazepam which did help with her sleep.         She has continued issues with fatigue but she believes that this is more so related to her sleep.  She does report a history of anemia secondary to blood loss due to stomach ulcers.  She denies any abdominal pain, no blood in the stool and no dark stools/black stools since being on Nexium.  She was supposed to have follow-up with the cancer center earlier this year regarding her anemia but she missed the appointment and has not yet rescheduled.         She denies any suicidal thoughts or ideations related to depression.  She does not feel as if she is having any panic attacks related to her anxiety.  She does wish that she could get medication to help with sleep.  She has not seen a psychiatrist while leaving here in follow-up of her anxiety or depression.  She reports that she did talk to someone on the phone for counseling and is helped that she was able to " get some things off her chest."         She has had issues with nasal congestion, allergic rhinitis but cannot take antihistamines such as Benadryl, Zyrtec or Allegra as these also make her feel jittery.  She  was able to tolerate Claritin-D earlier in the year when this was prescribed and this did help with congestion  Past Medical History:  Diagnosis Date  . Allergy   . Anemia   . Diverticulosis   . Gall bladder stones 1993  . Gastric ulcer   . GERD (gastroesophageal reflux disease)   . Hypertension   . Renal mass     Past Surgical History:  Procedure Laterality Date  . ABDOMINAL HYSTERECTOMY    . CHOLECYSTECTOMY      Family History  Problem Relation Age of Onset  . Cancer Maternal Uncle        Lung  . Cancer Maternal Uncle        Lung  . Cancer Maternal Uncle        Lung    Social History   Socioeconomic History  . Marital status: Single    Spouse name: Not on file  . Number of children: 3  . Years of education:  Not on file  . Highest education level: Not on file  Occupational History  . Not on file  Social Needs  . Financial resource strain: Not on file  . Food insecurity    Worry: Not on file    Inability: Not on file  . Transportation needs    Medical: Not on file    Non-medical: Not on file  Tobacco Use  . Smoking status: Current Every Day Smoker    Packs/day: 0.25    Years: 25.00    Pack years: 6.25    Types: Cigarettes  . Smokeless tobacco: Never Used  . Tobacco comment: Pt tried to quit - helps with anxiety   Substance and Sexual Activity  . Alcohol use: Not Currently  . Drug use: No  . Sexual activity: Yes    Comment: hysterectomy  Lifestyle  . Physical activity    Days per week: Not on file    Minutes per session: Not on file  . Stress: Not on file  Relationships  . Social Herbalist on phone: Not on file    Gets together: Not on file    Attends religious service: Not on file    Active member of club or organization: Not on file    Attends meetings of clubs or organizations: Not on file    Relationship status: Not on file  . Intimate partner violence    Fear of current or ex partner: Not on file    Emotionally abused: Not on file    Physically abused: Not on file    Forced sexual activity: Not on file  Other Topics Concern  . Not on file  Social History Narrative  . Not on file    Outpatient Medications Prior to Visit  Medication Sig Dispense Refill  . amLODipine-benazepril (LOTREL) 10-40 MG capsule Take 1 capsule by mouth daily for 30 days. 30 capsule 11  . busPIRone (BUSPAR) 10 MG tablet Take 1 tablet (10 mg total) by mouth 2 (two) times daily. 60 tablet 1  . esomeprazole (NEXIUM) 40 MG capsule TAKE 1 CAPSULE BY MOUTH 2 TIMES DAILY BEFORE A MEAL. 60 capsule 3  . traZODone (DESYREL) 50 MG tablet TAKE 2 TABLETS (100 MG TOTAL) BY MOUTH AT BEDTIME. 180 tablet 0  . sucralfate (CARAFATE) 1 g tablet Take 1 tablet (1 g total) by mouth 4 (four) times daily.  120 tablet 3  . famotidine (PEPCID) 20 MG tablet TAKE 1 TABLET BY MOUTH TWICE A DAY  60 tablet 3   No facility-administered medications prior to visit.     Allergies  Allergen Reactions  . Ambien [Zolpidem Tartrate]     Jitteriness, nervousness, abdominal pain  . Hydroxyzine     Palpitations and jitteriness     ROS Review of Systems  Constitutional: Positive for fatigue. Negative for chills and fever.  HENT: Positive for congestion.   Eyes: Positive for visual disturbance. Negative for photophobia.  Respiratory: Negative for cough and shortness of breath.   Gastrointestinal: Positive for nausea (occasional with headaches). Negative for abdominal pain (controlled with medication; history of ulcers), blood in stool, constipation and diarrhea.  Endocrine: Positive for cold intolerance. Negative for heat intolerance, polydipsia, polyphagia and polyuria.  Genitourinary: Negative for dysuria and frequency.  Musculoskeletal: Negative for arthralgias, back pain and gait problem.  Neurological: Positive for dizziness (occasional mild) and headaches.  Hematological: Negative for adenopathy. Does not bruise/bleed easily.      Objective:    Physical Exam  Constitutional: She is oriented to person, place, and time. She appears well-developed and well-nourished.  HENT:  Right Ear: Hearing, external ear and ear canal normal.  Left Ear: Hearing, external ear and ear canal normal.  Nose: Mucosal edema and rhinorrhea (Mild clear scant nasal drainage) present. Right sinus exhibits no maxillary sinus tenderness and no frontal sinus tenderness. Left sinus exhibits no maxillary sinus tenderness and no frontal sinus tenderness.  TMs dull bilaterally with subacute fluid levels behind the TMs; mild posterior pharynx erythema/cobblestoning  Eyes: Conjunctivae are normal.  Neck: Neck supple. No JVD present. No thyromegaly present.  Cardiovascular: Normal rate and regular rhythm.  Pulmonary/Chest: Effort  normal and breath sounds normal.  Abdominal: Soft. There is no abdominal tenderness. There is no rebound and no guarding.  Musculoskeletal:        General: No tenderness or edema.  Lymphadenopathy:    She has no cervical adenopathy.  Neurological: She is alert and oriented to person, place, and time. No cranial nerve deficit.  Skin: Skin is warm and dry.  Psychiatric: She has a normal mood and affect. Her behavior is normal.  Nursing note and vitals reviewed.   BP 104/72   Pulse 89   Temp (!) 97.3 F (36.3 C) (Temporal)   Resp 17   Ht 5\' 3"  (1.6 m)   Wt 170 lb 9.6 oz (77.4 kg)   SpO2 98%   BMI 30.22 kg/m  Wt Readings from Last 3 Encounters:  12/06/18 170 lb 9.6 oz (77.4 kg)  08/04/18 179 lb 1.6 oz (81.2 kg)  07/07/18 177 lb 8 oz (80.5 kg)     Health Maintenance Due  Topic Date Due  . MAMMOGRAM  10/21/2014    Lab Results  Component Value Date   TSH 0.782 05/31/2018   Lab Results  Component Value Date   WBC 12.8 (H) 07/28/2018   HGB 11.3 (L) 07/28/2018   HCT 36.7 07/28/2018   MCV 88.2 07/28/2018   PLT 455 (H) 07/28/2018   Lab Results  Component Value Date   NA 141 07/28/2018   K 4.1 07/28/2018   CO2 29 07/28/2018   GLUCOSE 82 07/28/2018   BUN 13 07/28/2018   CREATININE 1.13 (H) 07/28/2018   BILITOT <0.2 (L) 07/28/2018   ALKPHOS 63 07/28/2018   AST 10 (L) 07/28/2018   ALT 10 07/28/2018   PROT 7.9 07/28/2018   ALBUMIN 4.1 07/28/2018   CALCIUM 9.6 07/28/2018   ANIONGAP 9 07/28/2018   Lab Results  Component Value  Date   CHOL 184 05/31/2018   Lab Results  Component Value Date   HDL 55 05/31/2018   Lab Results  Component Value Date   LDLCALC 104 (H) 05/31/2018   Lab Results  Component Value Date   TRIG 123 05/31/2018   Lab Results  Component Value Date   CHOLHDL 3.3 05/31/2018   Lab Results  Component Value Date   HGBA1C 5.7 (H) 05/31/2018      Assessment & Plan:  1. Essential hypertension Blood pressures controlled on patient's  current amlodipine-benazepril 10-40 mg daily.  Continue current medication, low-salt diet and avoid decongestant use  2. GAD (generalized anxiety disorder); 3.  Moderate recurrent major depression She is encouraged to take her BuSpar on at least a twice daily basis for continued treatment of anxiety.  Discussed with patient that only taking medication once daily could actually be contributing to her headaches as well as this medication needs to be dosed no less than twice daily for maximum effectiveness.  Patient also with complaint of longstanding issues with sleep in addition to her depression therefore we will add Seroquel 25 mg at bedtime.  Patient will have CMP in follow-up of long-term medication use.  She is encouraged to continue counseling to help with anxiety and depression as well as caregiver stress. - QUEtiapine (SEROQUEL) 25 MG tablet; Take 1 tablet (25 mg total) by mouth at bedtime.  Dispense: 30 tablet; Refill: 3 - Comprehensive metabolic panel  4. Chronic daily headache Patient's headaches are likely multifactorial including issues with inability to sleep, anxiety and depression, being the primary caregiver for mother with Alzheimer's as well as issues with allergic rhinitis.  She also reports that she has experienced some visual changes and referral will be made to ophthalmology to make sure that issues with vision or not contributing to her headaches.  As she states that she has had chronic daily headaches for months and is sometimes awakened by her headaches, she will be referred to neurology for further evaluation and treatment as needed.  She will have complete metabolic panel in follow-up of her use of medication for treatment of headache such as taking acetaminophen multiple times daily. - Ambulatory referral to Ophthalmology - Ambulatory referral to Neurology - Comprehensive metabolic panel  5. Allergic rhinitis, unspecified seasonality, unspecified trigger Patient with  evidence of allergic rhinitis on exam as well as probable eustachian tube dysfunction.  Patient reports that she cannot take antihistamines such as Benadryl, Zyrtec or Allegra however she did tolerate Claritin-D that was given to her in the past.  We will have patient avoid the use of decongestants which may interfere with blood pressure and sleep.  Prescription provided for loratadine 10 mg daily and Flonase nasal spray once daily to see if this will help decrease her daily frontal headaches which might be partially caused by allergic rhinitis. - fluticasone (FLONASE) 50 MCG/ACT nasal spray; Place 2 sprays into both nostrils daily.  Dispense: 16 g; Refill: 6 - loratadine (CLARITIN) 10 MG tablet; Take 1 tablet (10 mg total) by mouth daily.  Dispense: 30 tablet; Refill: 11  6. Encounter for long-term current use of medication Patient will have CMP done in follow-up of long-term use of medications - Comprehensive metabolic panel  7. Iron deficiency anemia due to chronic blood loss; GERD Patient reports a history of iron deficiency anemia related to GI blood loss due to history of ulcers.  Patient with pending lab orders from the cancer center for CBC.  She is encouraged  to reschedule her follow-up with hematology and CBC will be done today in follow-up - CBC with Differential  8. Screening for breast cancer Patient states that mammogram was ordered by her prior provider and she needs to schedule appointment to have this done    Follow-up: Return in about 3 weeks (around 12/27/2018).    Antony Blackbird, MD

## 2018-12-06 NOTE — Patient Instructions (Signed)
Allergic Rhinitis, Adult Allergic rhinitis is a reaction to allergens in the air. Allergens are tiny specks (particles) in the air that cause your body to have an allergic reaction. This condition cannot be passed from person to person (is not contagious). Allergic rhinitis cannot be cured, but it can be controlled. There are two types of allergic rhinitis:  Seasonal. This type is also called hay fever. It happens only during certain times of the year.  Perennial. This type can happen at any time of the year. What are the causes? This condition may be caused by:  Pollen from grasses, trees, and weeds.  House dust mites.  Pet dander.  Mold. What are the signs or symptoms? Symptoms of this condition include:  Sneezing.  Runny or stuffy nose (nasal congestion).  A lot of mucus in the back of the throat (postnasal drip).  Itchy nose.  Tearing of the eyes.  Trouble sleeping.  Being sleepy during day. How is this treated? There is no cure for this condition. You should avoid things that trigger your symptoms (allergens). Treatment can help to relieve symptoms. This may include:  Medicines that block allergy symptoms, such as antihistamines. These may be given as a shot, nasal spray, or pill.  Shots that are given until your body becomes less sensitive to the allergen (desensitization).  Stronger medicines, if all other treatments have not worked. Follow these instructions at home: Avoiding allergens   Find out what you are allergic to. Common allergens include smoke, dust, and pollen.  Avoid them if you can. These are some of the things that you can do to avoid allergens: ? Replace carpet with wood, tile, or vinyl flooring. Carpet can trap dander and dust. ? Clean any mold found in the home. ? Do not smoke. Do not allow smoking in your home. ? Change your heating and air conditioning filter at least once a month. ? During allergy season:  Keep windows closed as much as  you can. If possible, use air conditioning when there is a lot of pollen in the air.  Use a special filter for allergies with your furnace and air conditioner.  Plan outdoor activities when pollen counts are lowest. This is usually during the early morning or evening hours.  If you do go outdoors when pollen count is high, wear a special mask for people with allergies.  When you come indoors, take a shower and change your clothes before sitting on furniture or bedding. General instructions  Do not use fans in your home.  Do not hang clothes outside to dry.  Wear sunglasses to keep pollen out of your eyes.  Wash your hands right away after you touch household pets.  Take over-the-counter and prescription medicines only as told by your doctor.  Keep all follow-up visits as told by your doctor. This is important. Contact a doctor if:  You have a fever.  You have a cough that does not go away (is persistent).  You start to make whistling sounds when you breathe (wheeze).  Your symptoms do not get better with treatment.  You have thick fluid coming from your nose.  You start to have nosebleeds. Get help right away if:  Your tongue or your lips are swollen.  You have trouble breathing.  You feel dizzy or you feel like you are going to pass out (faint).  You have cold sweats. Summary  Allergic rhinitis is a reaction to allergens in the air.  This condition may be   caused by allergens. These include pollen, dust mites, pet dander, and mold.  Symptoms include a runny, itchy nose, sneezing, or tearing eyes. You may also have trouble sleeping or feel sleepy during the day.  Treatment includes taking medicines and avoiding allergens. You may also get shots or take stronger medicines.  Get help if you have a fever or a cough that does not stop. Get help right away if you are short of breath. This information is not intended to replace advice given to you by your health care  provider. Make sure you discuss any questions you have with your health care provider. Document Released: 07/03/2010 Document Revised: 06/22/2018 Document Reviewed: 09/22/2017 Elsevier Patient Education  [employer].  Generalized Anxiety Disorder, Adult Generalized anxiety disorder (GAD) is a mental health disorder. People with this condition constantly worry about everyday events. Unlike normal anxiety, worry related to GAD is not triggered by a specific event. These worries also do not fade or get better with time. GAD interferes with life functions, including relationships, work, and school. GAD can vary from mild to severe. People with severe GAD can have intense waves of anxiety with physical symptoms (panic attacks). What are the causes? The exact cause of GAD is not known. What increases the risk? This condition is more likely to develop in:  Women.  People who have a family history of anxiety disorders.  People who are very shy.  People who experience very stressful life events, such as the death of a loved one.  People who have a very stressful family environment. What are the signs or symptoms? People with GAD often worry excessively about many things in their lives, such as their health and family. They may also be overly concerned about:  Doing well at work.  Being on time.  Natural disasters.  Friendships. Physical symptoms of GAD include:  Fatigue.  Muscle tension or having muscle twitches.  Trembling or feeling shaky.  Being easily startled.  Feeling like your heart is pounding or racing.  Feeling out of breath or like you cannot take a deep breath.  Having trouble falling asleep or staying asleep.  Sweating.  Nausea, diarrhea, or irritable bowel syndrome (IBS).  Headaches.  Trouble concentrating or remembering facts.  Restlessness.  Irritability. How is this diagnosed? Your health care provider can diagnose GAD based on your symptoms  and medical history. You will also have a physical exam. The health care provider will ask specific questions about your symptoms, including how severe they are, when they started, and if they come and go. Your health care provider may ask you about your use of alcohol or drugs, including prescription medicines. Your health care provider may refer you to a mental health specialist for further evaluation. Your health care provider will do a thorough examination and may perform additional tests to rule out other possible causes of your symptoms. To be diagnosed with GAD, a person must have anxiety that:  Is out of his or her control.  Affects several different aspects of his or her life, such as work and relationships.  Causes distress that makes him or her unable to take part in normal activities.  Includes at least three physical symptoms of GAD, such as restlessness, fatigue, trouble concentrating, irritability, muscle tension, or sleep problems. Before your health care provider can confirm a diagnosis of GAD, these symptoms must be present more days than they are not, and they must last for six months or longer. How is this  treated? The following therapies are usually used to treat GAD:  Medicine. Antidepressant medicine is usually prescribed for long-term daily control. Antianxiety medicines may be added in severe cases, especially when panic attacks occur.  Talk therapy (psychotherapy). Certain types of talk therapy can be helpful in treating GAD by providing support, education, and guidance. Options include: ? Cognitive behavioral therapy (CBT). People learn coping skills and techniques to ease their anxiety. They learn to identify unrealistic or negative thoughts and behaviors and to replace them with positive ones. ? Acceptance and commitment therapy (ACT). This treatment teaches people how to be mindful as a way to cope with unwanted thoughts and feelings. ? Biofeedback. This process  trains you to manage your body's response (physiological response) through breathing techniques and relaxation methods. You will work with a therapist while machines are used to monitor your physical symptoms.  Stress management techniques. These include yoga, meditation, and exercise. A mental health specialist can help determine which treatment is best for you. Some people see improvement with one type of therapy. However, other people require a combination of therapies. Follow these instructions at home:  Take over-the-counter and prescription medicines only as told by your health care provider.  Try to maintain a normal routine.  Try to anticipate stressful situations and allow extra time to manage them.  Practice any stress management or self-calming techniques as taught by your health care provider.  Do not punish yourself for setbacks or for not making progress.  Try to recognize your accomplishments, even if they are small.  Keep all follow-up visits as told by your health care provider. This is important. Contact a health care provider if:  Your symptoms do not get better.  Your symptoms get worse.  You have signs of depression, such as: ? A persistently sad, cranky, or irritable mood. ? Loss of enjoyment in activities that used to bring you joy. ? Change in weight or eating. ? Changes in sleeping habits. ? Avoiding friends or family members. ? Loss of energy for normal tasks. ? Feelings of guilt or worthlessness. Get help right away if:  You have serious thoughts about hurting yourself or others. If you ever feel like you may hurt yourself or others, or have thoughts about taking your own life, get help right away. You can go to your nearest emergency department or call:  Your local emergency services (911 in the [place]  A suicide crisis helpline, such as the [place] at 309-295-0014. This is open 24 hours a day. Summary  Generalized  anxiety disorder (GAD) is a mental health disorder that involves worry that is not triggered by a specific event.  People with GAD often worry excessively about many things in their lives, such as their health and family.  GAD may cause physical symptoms such as restlessness, trouble concentrating, sleep problems, frequent sweating, nausea, diarrhea, headaches, and trembling or muscle twitching.  A mental health specialist can help determine which treatment is best for you. Some people see improvement with one type of therapy. However, other people require a combination of therapies. This information is not intended to replace advice given to you by your health care provider. Make sure you discuss any questions you have with your health care provider. Document Released: 06/28/2012 Document Revised: 02/13/2017 Document Reviewed: 01/22/2016 Elsevier Patient Education  2020 [employer].

## 2018-12-06 NOTE — Progress Notes (Signed)
States that she has headaches daily. Is taking OTC Tylenol with no relief.

## 2018-12-07 LAB — CBC WITH DIFFERENTIAL/PLATELET
Basophils Absolute: 0.1 x10E3/uL (ref 0.0–0.2)
Basos: 0 %
EOS (ABSOLUTE): 0.1 x10E3/uL (ref 0.0–0.4)
Eos: 1 %
Hematocrit: 36.6 % (ref 34.0–46.6)
Hemoglobin: 11.7 g/dL (ref 11.1–15.9)
Immature Grans (Abs): 0 x10E3/uL (ref 0.0–0.1)
Immature Granulocytes: 0 %
Lymphocytes Absolute: 5.2 x10E3/uL — ABNORMAL HIGH (ref 0.7–3.1)
Lymphs: 43 %
MCH: 28.3 pg (ref 26.6–33.0)
MCHC: 32 g/dL (ref 31.5–35.7)
MCV: 88 fL (ref 79–97)
Monocytes Absolute: 0.8 x10E3/uL (ref 0.1–0.9)
Monocytes: 6 %
Neutrophils Absolute: 5.9 x10E3/uL (ref 1.4–7.0)
Neutrophils: 50 %
Platelets: 463 x10E3/uL — ABNORMAL HIGH (ref 150–450)
RBC: 4.14 x10E6/uL (ref 3.77–5.28)
RDW: 15.9 % — ABNORMAL HIGH (ref 11.7–15.4)
WBC: 12.1 x10E3/uL — ABNORMAL HIGH (ref 3.4–10.8)

## 2018-12-07 LAB — COMPREHENSIVE METABOLIC PANEL WITH GFR
ALT: 17 IU/L (ref 0–32)
AST: 12 IU/L (ref 0–40)
Albumin/Globulin Ratio: 1.6 (ref 1.2–2.2)
Albumin: 4.5 g/dL (ref 3.8–4.9)
Alkaline Phosphatase: 73 IU/L (ref 39–117)
BUN/Creatinine Ratio: 17 (ref 9–23)
BUN: 22 mg/dL (ref 6–24)
Bilirubin Total: <0.2 mg/dL (ref 0.0–1.2)
CO2: 24 mmol/L (ref 20–29)
Calcium: 9.6 mg/dL (ref 8.7–10.2)
Chloride: 101 mmol/L (ref 96–106)
Creatinine, Ser: 1.32 mg/dL — ABNORMAL HIGH (ref 0.57–1.00)
GFR calc Af Amer: 53 mL/min/1.73 — ABNORMAL LOW
GFR calc non Af Amer: 46 mL/min/1.73 — ABNORMAL LOW
Globulin, Total: 2.9 g/dL (ref 1.5–4.5)
Glucose: 81 mg/dL (ref 65–99)
Potassium: 4.3 mmol/L (ref 3.5–5.2)
Sodium: 139 mmol/L (ref 134–144)
Total Protein: 7.4 g/dL (ref 6.0–8.5)

## 2018-12-10 NOTE — Progress Notes (Signed)
Patient notified of results & recommendations. Expressed understanding. Patient states that she will call Hematology to get an appointment.

## 2018-12-22 ENCOUNTER — Telehealth: Payer: Self-pay | Admitting: Family Medicine

## 2018-12-22 NOTE — Telephone Encounter (Signed)
Natalie Edwards, can you contact patient and see if she tried the seroquel that was prescribed to help with her sleep/anxiety and does she think that this would help at a higher dose

## 2018-12-22 NOTE — Telephone Encounter (Signed)
Patient came in to discuss her sleep medication, says it isn't working well. Please contact patient regarding management.

## 2018-12-22 NOTE — Telephone Encounter (Signed)
Patient does not think that the Seroquel is helping. Is not interested in a higher dose of that medication.

## 2018-12-24 ENCOUNTER — Other Ambulatory Visit: Payer: Self-pay | Admitting: Family Medicine

## 2018-12-24 DIAGNOSIS — F5104 Psychophysiologic insomnia: Secondary | ICD-10-CM

## 2018-12-24 NOTE — Telephone Encounter (Signed)
Also sent patient a MyChart message.

## 2018-12-24 NOTE — Progress Notes (Signed)
Patient ID: Natalie Edwards, female   DOB: 08/13/1965, 53 y.o.   MRN: [id number]   53 year old female with complaint of long-term issues with difficulty sleeping and cannot tolerate several medications.  Referral is been placed for patient to see a sleep specialist

## 2018-12-24 NOTE — Telephone Encounter (Signed)
Patient reports difficulty tolerating multiple other medications to help with sleep in the past.  Please have patient try over-the-counter melatonin to see if this will help improve her sleep, she can take up to 10 mg at bedtime.  Please also let patient know that I will place a referral for her to be seen by a sleep specialist

## 2018-12-24 NOTE — Telephone Encounter (Signed)
Left voicemail to call back.  Patient also has a visit with provider on 12/27/2018. Can further discuss then.

## 2018-12-27 ENCOUNTER — Ambulatory Visit (INDEPENDENT_AMBULATORY_CARE_PROVIDER_SITE_OTHER): Payer: Medicare Other | Admitting: Family Medicine

## 2018-12-27 DIAGNOSIS — F411 Generalized anxiety disorder: Secondary | ICD-10-CM | POA: Diagnosis not present

## 2018-12-27 DIAGNOSIS — R519 Headache, unspecified: Secondary | ICD-10-CM

## 2018-12-27 DIAGNOSIS — J309 Allergic rhinitis, unspecified: Secondary | ICD-10-CM | POA: Diagnosis not present

## 2018-12-27 DIAGNOSIS — F5104 Psychophysiologic insomnia: Secondary | ICD-10-CM

## 2018-12-27 MED ORDER — TEMAZEPAM 7.5 MG PO CAPS: 7.5000 mg | ORAL_CAPSULE | Freq: Every evening | ORAL | 1 refills | Status: DC | PRN

## 2018-12-27 NOTE — Progress Notes (Signed)
Virtual Visit via Telephone Note  I connected with Natalie Edwards  on 12/27/18 at  9:10 AM EDT by telephone and verified that I am speaking with the correct person using two identifiers.   I discussed the limitations, risks, security and privacy concerns of performing an evaluation and management service by telephone and the availability of in person appointments. I also discussed with the patient that there may be a patient responsible charge related to this service. The patient expressed understanding and agreed to proceed.  Patient Location: Home Provider Location: Office  Others participating in call: none   History of Present Illness:        53 yo who is seen in follow-up of appointment on 12/06/2018 at which time patient complained of chronic issues with insomnia, anxiety and daily headaches.  Patient states that she did receive appointment with neurology which will take place on 01/19/2019.  She however believes that her headaches may be related to her sinuses that she obtained an over-the-counter generic sinus medication which she believes may contain a decongestant and she is taking this medication and it does decrease the intensity of her daily headaches.  Patient however is intolerant of stronger antihistamines such as Zyrtec but currently takes Claritin.       Patient called last week with complaint of inability to sleep despite use of Seroquel 25 mg which was prescribed at her last visit.  Patient reports that the Seroquel did nothing for her sleep and she believes that it caused her to have a burning sensation in her stomach which she states resolved after she stopped the use of Seroquel.  She reports continued issues with difficulty falling asleep as well as difficulty remaining asleep and states that she is tired all day as she only gets a few hours of sleep per night.  She again request temazepam though this was discussed at the last visit and she was made aware at that time that this  medication has potential for addiction and dependence and therefore was not going to be prescribed.  Patient denies any issues with daytime drowsiness or sensation of wanting to fall asleep or actually falling asleep while driving.  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  She states that when she lived in a different state, she was on temazepam and found this medication to be helpful for sleep.  She reports that she cannot tolerate Ambien or trazodone as these medications cause her to feel jittery as if she will have palpitations.  She has taken Benadryl in the past and this did help with sleep.  She does continue to take medication for anxiety disorder.  She still has some anxiety but does feel that it is improved on her current medication.   Past Medical History:  Diagnosis Date  . Allergy   . Anemia   . Diverticulosis   . Gall bladder stones 1993  . Gastric ulcer   . GERD (gastroesophageal reflux disease)   . Hypertension   . Renal mass     Past Surgical History:  Procedure Laterality Date  . ABDOMINAL HYSTERECTOMY    . CHOLECYSTECTOMY      Family History  Problem Relation Age of Onset  . Cancer Maternal Uncle        Lung  . Cancer Maternal Uncle        Lung  . Cancer Maternal Uncle        Lung    Social History   Tobacco Use  .  Smoking status: Current Every Day Smoker    Packs/day: 0.25    Years: 25.00    Pack years: 6.25    Types: Cigarettes  . Smokeless tobacco: Never Used  . Tobacco comment: Pt tried to quit - helps with anxiety   Substance Use Topics  . Alcohol use: Not Currently  . Drug use: No     Allergies  Allergen Reactions  . Ambien [Zolpidem Tartrate]     Jitteriness, nervousness, abdominal pain  . Hydroxyzine     Palpitations and jitteriness        Observations/Objective: No vital signs or physical exam conducted as visit was done via telephone  Assessment and Plan: 1. Chronic daily headache; 3.  Allergic rhinitis Patient is  encouraged to keep her upcoming appointment with neurology regarding her daily headaches.  She reports some improvement after starting over-the-counter decongestant medication however she also has hypertension therefore it is not advisable for her to take decongestant medication long-term.  Discussed possibility of trying Benadryl at bedtime as she states that this did help with sleep in the past and this may also help with her headaches if they are related to allergies.  She is currently on Claritin but stated at her last visit that she could not take stronger antihistamines and did not wish to use nasal sprays for treatment of her allergic rhinitis.  2. Chronic insomnia Again discussed with the patient that temazepam is in a class of medications called benzodiazepines that can increase the risk of addiction and dependence.  Additionally since patient's job requires driving, she needs to discuss taking this medication with her employer as the use of this medication may be prohibited.  If not, prescription provided for temazepam, Restoril 7.5 mg to take at bedtime on the days when she does not have to work the next day.  Patient has also been referred to a sleep medicine specialist and this was discussed with the patient and she is to keep that upcoming follow-up appointment.  She can also try over-the-counter Benadryl at bedtime on work nights to help with sleep as she states that this medication has worked in the past and this may also help with her headaches which she believes are related to allergic rhinitis. - temazepam (RESTORIL) 7.5 MG capsule; Take 1 capsule (7.5 mg total) by mouth at bedtime as needed for sleep. On non-work days  Dispense: 10 capsule; Refill: 1  4. GAD (generalized anxiety disorder) She is encouraged to continue the use of BuSpar for anxiety and consider psychiatry referral if her symptoms worsen or are not improved.  Follow Up Instructions:Return in about 3 months (around  03/29/2019) for chronic issues.    I discussed the assessment and treatment plan with the patient. The patient was provided an opportunity to ask questions and all were answered. The patient agreed with the plan and demonstrated an understanding of the instructions.   The patient was advised to call back or seek an in-person evaluation if the symptoms worsen or if the condition fails to improve as anticipated.  I provided 12  minutes of non-face-to-face time during this encounter.   Antony Blackbird, MD

## 2019-01-06 ENCOUNTER — Other Ambulatory Visit: Payer: Self-pay

## 2019-01-06 ENCOUNTER — Ambulatory Visit (INDEPENDENT_AMBULATORY_CARE_PROVIDER_SITE_OTHER): Payer: Medicare Other | Admitting: Pulmonary Disease

## 2019-01-06 ENCOUNTER — Encounter: Payer: Self-pay | Admitting: Pulmonary Disease

## 2019-01-06 DIAGNOSIS — F5104 Psychophysiologic insomnia: Secondary | ICD-10-CM

## 2019-01-06 MED ORDER — TEMAZEPAM 15 MG PO CAPS: 15.0000 mg | ORAL_CAPSULE | Freq: Every evening | ORAL | 3 refills | Status: DC | PRN

## 2019-01-06 NOTE — Patient Instructions (Signed)
Sleep onset and sleep maintenance insomnia Anxiety  We will increase her dose of Restoril to 15 mg nightly  Call with significant concerns  Continue to work on quitting smoking  In for material for sleep apnea, let us consider study if symptoms are significant Sleep Apnea Sleep apnea is a condition in which breathing pauses or becomes shallow during sleep. Episodes of sleep apnea usually last 10 seconds or longer, and they may occur as many as 20 times an hour. Sleep apnea disrupts your sleep and keeps your body from getting the rest that it needs. This condition can increase your risk of certain health problems, including:  Heart attack.  Stroke.  Obesity.  Diabetes.  Heart failure.  Irregular heartbeat. What are the causes? There are three kinds of sleep apnea:  Obstructive sleep apnea. This kind is caused by a blocked or collapsed airway.  Central sleep apnea. This kind happens when the part of the brain that controls breathing does not send the correct signals to the muscles that control breathing.  Mixed sleep apnea. This is a combination of obstructive and central sleep apnea. The most common cause of this condition is a collapsed or blocked airway. An airway can collapse or become blocked if:  Your throat muscles are abnormally relaxed.  Your tongue and tonsils are larger than normal.  You are overweight.  Your airway is smaller than normal. What increases the risk? You are more likely to develop this condition if you:  Are overweight.  Smoke.  Have a smaller than normal airway.  Are elderly.  Are female.  Drink alcohol.  Take sedatives or tranquilizers.  Have a family history of sleep apnea. What are the signs or symptoms? Symptoms of this condition include:  Trouble staying asleep.  Daytime sleepiness and tiredness.  Irritability.  Loud snoring.  Morning headaches.  Trouble concentrating.  Forgetfulness.  Decreased interest in sex.   Unexplained sleepiness.  Mood swings.  Personality changes.  Feelings of depression.  Waking up often during the night to urinate.  Dry mouth.  Sore throat. How is this diagnosed? This condition may be diagnosed with:  A medical history.  A physical exam.  A series of tests that are done while you are sleeping (sleep study). These tests are usually done in a sleep lab, but they may also be done at home. How is this treated? Treatment for this condition aims to restore normal breathing and to ease symptoms during sleep. It may involve managing health issues that can affect breathing, such as high blood pressure or obesity. Treatment may include:  Sleeping on your side.  Using a decongestant if you have nasal congestion.  Avoiding the use of depressants, including alcohol, sedatives, and narcotics.  Losing weight if you are overweight.  Making changes to your diet.  Quitting smoking.  Using a device to open your airway while you sleep, such as: ? An oral appliance. This is a custom-made mouthpiece that shifts your lower jaw forward. ? A continuous positive airway pressure (CPAP) device. This device blows air through a mask when you breathe out (exhale). ? A nasal expiratory positive airway pressure (EPAP) device. This device has valves that you put into each nostril. ? A bi-level positive airway pressure (BPAP) device. This device blows air through a mask when you breathe in (inhale) and breathe out (exhale).  Having surgery if other treatments do not work. During surgery, excess tissue is removed to create a wider airway. It is important to get  treatment for sleep apnea. Without treatment, this condition can lead to:  High blood pressure.  Coronary artery disease.  In men, an inability to achieve or maintain an erection (impotence).  Reduced thinking abilities. Follow these instructions at home: Lifestyle  Make any lifestyle changes that your health care provider  recommends.  Eat a healthy, well-balanced diet.  Take steps to lose weight if you are overweight.  Avoid using depressants, including alcohol, sedatives, and narcotics.  Do not use any products that contain nicotine or tobacco, such as cigarettes, e-cigarettes, and chewing tobacco. If you need help quitting, ask your health care provider. General instructions  Take over-the-counter and prescription medicines only as told by your health care provider.  If you were given a device to open your airway while you sleep, use it only as told by your health care provider.  If you are having surgery, make sure to tell your health care provider you have sleep apnea. You may need to bring your device with you.  Keep all follow-up visits as told by your health care provider. This is important. Contact a health care provider if:  The device that you received to open your airway during sleep is uncomfortable or does not seem to be working.  Your symptoms do not improve.  Your symptoms get worse. Get help right away if:  You develop: ? Chest pain. ? Shortness of breath. ? Discomfort in your back, arms, or stomach.  You have: ? Trouble speaking. ? Weakness on one side of your body. ? Drooping in your face. These symptoms may represent a serious problem that is an emergency. Do not wait to see if the symptoms will go away. Get medical help right away. Call your local emergency services (911 in the [place] Do not drive yourself to the hospital. Summary  Sleep apnea is a condition in which breathing pauses or becomes shallow during sleep.  The most common cause is a collapsed or blocked airway.  The goal of treatment is to restore normal breathing and to ease symptoms during sleep. This information is not intended to replace advice given to you by your health care provider. Make sure you discuss any questions you have with your health care provider. Document Released: 02/21/2002 Document  Revised: 12/18/2017 Document Reviewed: 10/27/2017 Elsevier Patient Education  2020 [employer].

## 2019-01-06 NOTE — Progress Notes (Signed)
Subjective:    Patient ID: Natalie Edwards, female    DOB: [birth year], 53 y.o.   MRN: [id number]  Patient with a history of chronic insomnia  Evaluated in the past about 5 years ago She was on restoral in the past that seem to help symptoms  Has recently tried Seroquel-did not help, associated side effect  Usually tries to go to bed between 8 and 9 PM Takes hours to fall asleep Wakes up routinely over 3 times during the night Final awakening time about 6 AM  She does have a history of snoring No witnessed apneas Occasional dryness of her mouth in the mornings She does have headaches  No family history of sleep disordered breathing  She is an active smoker, a pack of cigarettes lasts her about 3 days  Past Medical History:  Diagnosis Date  . Allergy   . Anemia   . Diverticulosis   . Gall bladder stones 1993  . Gastric ulcer   . GERD (gastroesophageal reflux disease)   . Hypertension   . Renal mass    Social History   Socioeconomic History  . Marital status: Single    Spouse name: Not on file  . Number of children: 3  . Years of education: Not on file  . Highest education level: Not on file  Occupational History  . Not on file  Social Needs  . Financial resource strain: Not on file  . Food insecurity    Worry: Not on file    Inability: Not on file  . Transportation needs    Medical: Not on file    Non-medical: Not on file  Tobacco Use  . Smoking status: Current Every Day Smoker    Packs/day: 0.25    Years: 25.00    Pack years: 6.25    Types: Cigarettes  . Smokeless tobacco: Never Used  . Tobacco comment: Pt tried to quit - helps with anxiety   Substance and Sexual Activity  . Alcohol use: Not Currently  . Drug use: No  . Sexual activity: Yes    Comment: hysterectomy  Lifestyle  . Physical activity    Days per week: Not on file    Minutes per session: Not on file  . Stress: Not on file  Relationships  . Social Herbalist on phone: Not on  file    Gets together: Not on file    Attends religious service: Not on file    Active member of club or organization: Not on file    Attends meetings of clubs or organizations: Not on file    Relationship status: Not on file  . Intimate partner violence    Fear of current or ex partner: Not on file    Emotionally abused: Not on file    Physically abused: Not on file    Forced sexual activity: Not on file  Other Topics Concern  . Not on file  Social History Narrative  . Not on file   Family History  Problem Relation Age of Onset  . Cancer Maternal Uncle        Lung  . Cancer Maternal Uncle        Lung  . Cancer Maternal Uncle        Lung   Review of Systems  Constitutional: Negative for fever and unexpected weight change.  HENT: Negative for congestion, dental problem, ear pain, nosebleeds, postnasal drip, rhinorrhea, sinus pressure, sneezing, sore throat and trouble swallowing.  Eyes: Negative for redness and itching.  Respiratory: Negative for cough, chest tightness, shortness of breath and wheezing.   Cardiovascular: Negative for palpitations and leg swelling.  Gastrointestinal: Negative for nausea and vomiting.  Genitourinary: Negative for dysuria.  Musculoskeletal: Negative for joint swelling.  Skin: Negative for rash.  Allergic/Immunologic: Negative.  Negative for environmental allergies, food allergies and immunocompromised state.  Neurological: Positive for headaches.  Hematological: Does not bruise/bleed easily.  Psychiatric/Behavioral: Negative for dysphoric mood. The patient is nervous/anxious.       Objective:   Physical Exam Constitutional:      Appearance: Normal appearance.  HENT:     Head: Normocephalic.     Nose: Nose normal.     Mouth/Throat:     Mouth: Mucous membranes are moist.     Comments: Mallampati 1, Eyes:     General:        Right eye: No discharge.        Left eye: No discharge.     Pupils: Pupils are equal, round, and reactive to  light.  Neck:     Musculoskeletal: Normal range of motion. No neck rigidity.  Cardiovascular:     Rate and Rhythm: Normal rate and regular rhythm.     Pulses: Normal pulses.     Heart sounds: No murmur. No friction rub.  Pulmonary:     Effort: Pulmonary effort is normal. No respiratory distress.     Breath sounds: No stridor. No wheezing or rhonchi.  Musculoskeletal: Normal range of motion.        General: No swelling.  Skin:    General: Skin is warm.     Coloration: Skin is not jaundiced or pale.  Neurological:     General: No focal deficit present.     Mental Status: She is alert.     Cranial Nerves: No cranial nerve deficit.  Psychiatric:        Mood and Affect: Mood normal.    Vitals:   01/06/19 1417  BP: 116/62  Pulse: (!) 105  SpO2: 98%   Results of the Epworth flowsheet 01/06/2019  Sitting and reading 0  Watching TV 0  Sitting, inactive in a public place (e.g. a theatre or a meeting) 0  As a passenger in a car for an hour without a break 0  Lying down to rest in the afternoon when circumstances permit 0  Sitting and talking to someone 0  Sitting quietly after a lunch without alcohol 0  In a car, while stopped for a few minutes in traffic 0  Total score 0      Assessment & Plan:  Sleep onset and sleep maintenance insomnia History of depression and anxiety Nicotine addiction  Plan: We will increase dose of Restoril to 15 mg nightly   The significance of quitting smoking discussed-nicotine being a stimulant will continue to contribute to sleep onset and sleep maintenance insomnia, deprive her of deep sleep  The importance of stimulus control regarding anxiety contributing to sleep onset and sleep maintenance insomnia also discussed  We did talk about sleep disordered breathing contributing to insomnia-she does not appear to have significant symptoms during the day I will provide her with information material-a sleep study was discussed with the patient  I  will see her back in the office in about 3 months  Encouraged to call with any significant concerns  Smoking cessation counseling

## 2019-01-19 ENCOUNTER — Encounter: Payer: Self-pay | Admitting: Neurology

## 2019-01-19 ENCOUNTER — Other Ambulatory Visit: Payer: Self-pay

## 2019-01-19 ENCOUNTER — Ambulatory Visit (INDEPENDENT_AMBULATORY_CARE_PROVIDER_SITE_OTHER): Payer: Medicare Other | Admitting: Neurology

## 2019-01-19 ENCOUNTER — Telehealth: Payer: Self-pay | Admitting: Neurology

## 2019-01-19 VITALS — BP 98/67 | HR 94 | Temp 97.6°F | Ht 63.0 in | Wt 171.0 lb

## 2019-01-19 DIAGNOSIS — R51 Headache with orthostatic component, not elsewhere classified: Secondary | ICD-10-CM | POA: Diagnosis not present

## 2019-01-19 DIAGNOSIS — G441 Vascular headache, not elsewhere classified: Secondary | ICD-10-CM | POA: Diagnosis not present

## 2019-01-19 DIAGNOSIS — R519 Headache, unspecified: Secondary | ICD-10-CM | POA: Diagnosis not present

## 2019-01-19 DIAGNOSIS — H539 Unspecified visual disturbance: Secondary | ICD-10-CM

## 2019-01-19 NOTE — Progress Notes (Signed)
HPI: First noted the persistent headaches in June/July. Sharp pain in her forehead left or right equally, then vision becomes blurry. Made worse with bright lights particualry when night driving and she passes a car the headlights are unbearable. She is not affected by loud noises or persistent noises. Almost always present in the morning when she wakes. Endorses nausea on occasion with the headache.   Has had HA previously but these resolved with tylenol. History of treated HTN. Has been told she snores. Wakes frequently at night and can not maintain sleep  Starts sleeping at 9-10 but awakens around 1-2 hours later. Sleepy during the day. Does not fall asleep while watching TV or driving.  Has not had a sleep study. She is not on CPAP. Insomnia is a persistent issue. She has taken Ambien which worsened her depression.  Smokes 1/3ppd. Denied EtOH use. Drinks 3-4 Pepsi bottles per day as well as coffee at 1-2 cups per day.

## 2019-01-19 NOTE — Progress Notes (Signed)
[facility]    Provider:  Dr Jaynee Eagles Requesting Provider: Antony Blackbird MD Primary Care Provider:  Scot Jun, FNP,  Antony Blackbird MD  CC:  Morning headaches  HPI:  Natalie Edwards is a 53 y.o. female here as requested by Scot Jun, FNP for headaches. Headaches apon walking. She has headaches throughout the day. She uses a big bottle of tylenol and takes multiple a day. Discussed medication overuse headache. Headaches don't wake her up but she does wake up with headaches. She is very tired, multiple arousals, headaches for 4 months, difficulty sleeping, wakes up with dry mouth. She snores. No head trauma, She gets tired during the day. Headaches are positional and  Sometimes exertional and she has vision changes, blurry with headaches. She saw Dr. Katy Fitch and she is changing glasses. First noted the persistent headaches in June/July. Sharp pain in her forehead left or right equally, then vision becomes blurry. Made worse with bright lights particualry when night driving and she passes a car the headlights are unbearable. She is not affected by loud noises or persistent noises. Almost always present in the morning when she wakes. Endorses nausea on occasion with the headache.  No autonomic symptoms and headaches last for 24 hours. Has had HA previously but these resolved with tylenol.History of treated HTN. Has been told she snores. Wakes frequently at night and can not maintain sleep Starts sleeping at 9-10 but awakens around 1-2 hours later. Sleepy during the day. Does not fall asleep while watching TV or driving. Has not had a sleep study. She is not on CPAP. Insomnia is a persistent issue. She has taken Ambien which worsened her depression.  Smokes 1/3ppd. Denied EtOH use. Drinks 3-4 Pepsi bottles per day as well as coffee at 1-2 cups per day.   Reviewed notes, labs and imaging from outside physicians, which showed:   Ct showed No acute intracranial abnormalities  including mass lesion or mass effect, hydrocephalus, extra-axial fluid collection, midline shift, hemorrhage, or acute infarction, large ischemic events (personally reviewed images)     Review of Systems: Patient complains of symptoms per HPI as well as the following symptoms headaches. Pertinent negatives and positives per HPI. All others negative.   Social History   Socioeconomic History  . Marital status: Single    Spouse name: Not on file  . Number of children: 3  . Years of education: Not on file  . Highest education level: High school graduate  Occupational History  . Not on file  Social Needs  . Financial resource strain: Not on file  . Food insecurity    Worry: Not on file    Inability: Not on file  . Transportation needs    Medical: Not on file    Non-medical: Not on file  Tobacco Use  . Smoking status: Current Every Day Smoker    Packs/day: 0.25    Years: 25.00    Pack years: 6.25    Types: Cigarettes  . Smokeless tobacco: Never Used  . Tobacco comment: Pt tried to quit - helps with anxiety   Substance and Sexual Activity  . Alcohol use: Not Currently  . Drug use: No  . Sexual activity: Yes    Comment: hysterectomy  Lifestyle  . Physical activity    Days per week: Not on file    Minutes per session: Not on file  . Stress: Not on file  Relationships  . Social connections    Talks on phone: Not on  file    Gets together: Not on file    Attends religious service: Not on file    Active member of club or organization: Not on file    Attends meetings of clubs or organizations: Not on file    Relationship status: Not on file  . Intimate partner violence    Fear of current or ex partner: Not on file    Emotionally abused: Not on file    Physically abused: Not on file    Forced sexual activity: Not on file  Other Topics Concern  . Not on file  Social History Narrative   Lives at home in an apartment. Her mother lives with her.    Right handed   Caffeine:  drinks approx. 36 oz of pepsi per day. Sometimes drinks 2 cups of coffee in a day as well.     Family History  Problem Relation Age of Onset  . Cancer Maternal Uncle        Lung  . Cancer Maternal Uncle        Lung  . Cancer Maternal Uncle        Lung  . Headache Neg Hx        "I don't think so"  . Migraines Neg Hx        "I don't think so"    Past Medical History:  Diagnosis Date  . Allergy   . Anemia   . Diverticulosis   . Gall bladder stones 1993  . Gastric ulcer   . GERD (gastroesophageal reflux disease)   . Hypertension   . Renal mass     Patient Active Problem List   Diagnosis Date Noted  . GAD (generalized anxiety disorder) 05/07/2018  . Chronic gastric ulcer 05/07/2018  . Essential hypertension 05/07/2018  . Iron deficiency anemia due to chronic blood loss 09/23/2016  . Peptic ulcer disease with hemorrhage 09/23/2016  . Thrombocytosis ([place]) 09/09/2016  . Leukocytosis 09/09/2016  . History of hypertension 07/31/2016  . H/O ulcer disease 07/31/2016    Past Surgical History:  Procedure Laterality Date  . ABDOMINAL HYSTERECTOMY    . CHOLECYSTECTOMY      Current Outpatient Medications  Medication Sig Dispense Refill  . amLODipine-benazepril (LOTREL) 10-40 MG capsule Take 1 capsule by mouth daily for 30 days. 30 capsule 11  . busPIRone (BUSPAR) 10 MG tablet Take 1 tablet (10 mg total) by mouth 2 (two) times daily. 60 tablet 1  . esomeprazole (NEXIUM) 40 MG capsule TAKE 1 CAPSULE BY MOUTH 2 TIMES DAILY BEFORE A MEAL. 60 capsule 3  . fluticasone (FLONASE) 50 MCG/ACT nasal spray Place 2 sprays into both nostrils daily. 16 g 6  . loratadine (CLARITIN) 10 MG tablet Take 1 tablet (10 mg total) by mouth daily. 30 tablet 11  . QUEtiapine (SEROQUEL) 25 MG tablet Take 1 tablet (25 mg total) by mouth at bedtime. 30 tablet 3  . temazepam (RESTORIL) 15 MG capsule Take 1 capsule (15 mg total) by mouth at bedtime as needed for sleep. 30 capsule 3   No current  facility-administered medications for this visit.     Allergies as of 01/19/2019 - Review Complete 01/19/2019  Allergen Reaction Noted  . Ambien [zolpidem tartrate]  06/07/2018  . Hydroxyzine  06/07/2018    Vitals: BP 98/67 (BP Location: Right Arm, Patient Position: Sitting)   Pulse 94   Temp 97.6 F (36.4 C) Comment: taken at front door  Ht 5\' 3"  (1.6 m)   Wt 171 lb (  77.6 kg)   BMI 30.29 kg/m  Last Weight:  Wt Readings from Last 1 Encounters:  01/19/19 171 lb (77.6 kg)   Last Height:   Ht Readings from Last 1 Encounters:  01/19/19 5\' 3"  (1.6 m)     Physical exam: Exam: Gen: NAD, conversant, well nourised, well groomed                     CV: RRR, no MRG. No Carotid Bruits. No peripheral edema, warm, nontender Eyes: Conjunctivae clear without exudates or hemorrhage  Neuro: Detailed Neurologic Exam  Speech:    Speech is normal; fluent and spontaneous with normal comprehension.  Cognition:    The patient is oriented to person, place, and time;     recent and remote memory intact;     language fluent;     normal attention, concentration,     fund of knowledge Cranial Nerves:    The pupils are equal, round, and reactive to light. attempted fundoscopy could not visualize. Visual fields are full to finger confrontation. Extraocular movements are intact. Trigeminal sensation is intact and the muscles of mastication are normal. The face is symmetric. The palate elevates in the midline. Hearing intact. Voice is normal. Shoulder shrug is normal. The tongue has normal motion without fasciculations.   Coordination:    Normal finger to nose .   Gait:    Normal native gait  Motor Observation:    No asymmetry, no atrophy, and no involuntary movements noted. Tone:    Normal muscle tone.    Posture:    Posture is normal. normal erect    Strength:    Strength is V/V in the upper and lower limbs.      Sensation: intact to LT     Reflex Exam:  DTR's:    Deep tendon  reflexes in the upper and lower extremities are symmetrical bilaterally.   Toes:    The toes are downgoing bilaterally.   Clonus:    Clonus is absent.    Assessment/Plan:  Lovely patient with daily headache.Severe. Wakes up with the headaches most days. Does not sound like cluster headache. No unilateral autonomic symptoms. She does however say she can get a sharp pain around the temple sometimes but that is not the main feature. In the morning headache is more pounding, not sharp and lasts all day.    Sleep study: Recommend calling her pulmonary doctor who was discussing sleep study with her. Headaches don't wake her up but she does wake up with headaches every morning. No hx of migraines She is very tired, multiple arousals overnight, headaches for 4 months, difficulty sleeping, wakes up with dry mouth. She snores. No head trauma, She gets tired during the day. Wakes frequently at night and can not maintain sleep   MRI of the brain: MRI brain due to concerning symptoms of morning headaches, positional headaches,vision changes  to look for space occupying mass, chiari or intracranial hypertension (pseudotumor) or other.   Orders Placed This Encounter  Procedures  . MR BRAIN W WO CONTRAST  . Basic Metabolic Panel   No orders of the defined types were placed in this encounter.   Cc: Scot Jun, FNP,  Cammie Fulp MD  Sarina Ill, MD  [facility] [address] [place], [place] [address]  Phone [phone] Fax [phone]

## 2019-01-19 NOTE — Progress Notes (Signed)
Epworth Sleepiness Scale 0= would never doze 1= slight chance of dozing 2= moderate chance of dozing 3= high chance of dozing  Sitting and reading: 1 Watching TV: 1 Sitting inactive in a public place (ex. Theater or meeting): 1 As a passenger in a car for an hour without a break: 0 Lying down to rest in the afternoon: 0 Sitting and talking to someone: 0 Sitting quietly after lunch (no alcohol): 0 In a car, while stopped in traffic: 0 Total: 3

## 2019-01-19 NOTE — Patient Instructions (Signed)
Sleep Apnea Sleep apnea is a condition in which breathing pauses or becomes shallow during sleep. Episodes of sleep apnea usually last 10 seconds or longer, and they may occur as many as 20 times an hour. Sleep apnea disrupts your sleep and keeps your body from getting the rest that it needs. This condition can increase your risk of certain health problems, including:  Heart attack.  Stroke.  Obesity.  Diabetes.  Heart failure.  Irregular heartbeat. What are the causes? There are three kinds of sleep apnea:  Obstructive sleep apnea. This kind is caused by a blocked or collapsed airway.  Central sleep apnea. This kind happens when the part of the brain that controls breathing does not send the correct signals to the muscles that control breathing.  Mixed sleep apnea. This is a combination of obstructive and central sleep apnea. The most common cause of this condition is a collapsed or blocked airway. An airway can collapse or become blocked if:  Your throat muscles are abnormally relaxed.  Your tongue and tonsils are larger than normal.  You are overweight.  Your airway is smaller than normal. What increases the risk? You are more likely to develop this condition if you:  Are overweight.  Smoke.  Have a smaller than normal airway.  Are elderly.  Are female.  Drink alcohol.  Take sedatives or tranquilizers.  Have a family history of sleep apnea. What are the signs or symptoms? Symptoms of this condition include:  Trouble staying asleep.  Daytime sleepiness and tiredness.  Irritability.  Loud snoring.  Morning headaches.  Trouble concentrating.  Forgetfulness.  Decreased interest in sex.  Unexplained sleepiness.  Mood swings.  Personality changes.  Feelings of depression.  Waking up often during the night to urinate.  Dry mouth.  Sore throat. How is this diagnosed? This condition may be diagnosed with:  A medical history.  A physical  exam.  A series of tests that are done while you are sleeping (sleep study). These tests are usually done in a sleep lab, but they may also be done at home. How is this treated? Treatment for this condition aims to restore normal breathing and to ease symptoms during sleep. It may involve managing health issues that can affect breathing, such as high blood pressure or obesity. Treatment may include:  Sleeping on your side.  Using a decongestant if you have nasal congestion.  Avoiding the use of depressants, including alcohol, sedatives, and narcotics.  Losing weight if you are overweight.  Making changes to your diet.  Quitting smoking.  Using a device to open your airway while you sleep, such as: ? An oral appliance. This is a custom-made mouthpiece that shifts your lower jaw forward. ? A continuous positive airway pressure (CPAP) device. This device blows air through a mask when you breathe out (exhale). ? A nasal expiratory positive airway pressure (EPAP) device. This device has valves that you put into each nostril. ? A bi-level positive airway pressure (BPAP) device. This device blows air through a mask when you breathe in (inhale) and breathe out (exhale).  Having surgery if other treatments do not work. During surgery, excess tissue is removed to create a wider airway. It is important to get treatment for sleep apnea. Without treatment, this condition can lead to:  High blood pressure.  Coronary artery disease.  In men, an inability to achieve or maintain an erection (impotence).  Reduced thinking abilities. Follow these instructions at home: Lifestyle  Make any lifestyle changes   that your health care provider recommends.  Eat a healthy, well-balanced diet.  Take steps to lose weight if you are overweight.  Avoid using depressants, including alcohol, sedatives, and narcotics.  Do not use any products that contain nicotine or tobacco, such as cigarettes,  e-cigarettes, and chewing tobacco. If you need help quitting, ask your health care provider. General instructions  Take over-the-counter and prescription medicines only as told by your health care provider.  If you were given a device to open your airway while you sleep, use it only as told by your health care provider.  If you are having surgery, make sure to tell your health care provider you have sleep apnea. You may need to bring your device with you.  Keep all follow-up visits as told by your health care provider. This is important. Contact a health care provider if:  The device that you received to open your airway during sleep is uncomfortable or does not seem to be working.  Your symptoms do not improve.  Your symptoms get worse. Get help right away if:  You develop: ? Chest pain. ? Shortness of breath. ? Discomfort in your back, arms, or stomach.  You have: ? Trouble speaking. ? Weakness on one side of your body. ? Drooping in your face. These symptoms may represent a serious problem that is an emergency. Do not wait to see if the symptoms will go away. Get medical help right away. Call your local emergency services (911 in the [place] Do not drive yourself to the hospital. Summary  Sleep apnea is a condition in which breathing pauses or becomes shallow during sleep.  The most common cause is a collapsed or blocked airway.  The goal of treatment is to restore normal breathing and to ease symptoms during sleep. This information is not intended to replace advice given to you by your health care provider. Make sure you discuss any questions you have with your health care provider. Document Released: 02/21/2002 Document Revised: 12/18/2017 Document Reviewed: 10/27/2017 Elsevier Patient Education  2020 [employer]  

## 2019-01-19 NOTE — Telephone Encounter (Signed)
Would you call and tell patient that I spoke to the sleep team and unfortunately she has to go back to her pulmonary doctor to have the sleep test since he already started the workup, insurance reasons. I would call his office and tell them she wants the sleep study instead of waiting for an appointment in January or call and make a sooner appointment. Please cancel her appointment Tuesday. Our apologies.

## 2019-01-20 ENCOUNTER — Telehealth: Payer: Self-pay | Admitting: Neurology

## 2019-01-20 LAB — BASIC METABOLIC PANEL
BUN/Creatinine Ratio: 16 (ref 9–23)
BUN: 22 mg/dL (ref 6–24)
CO2: 21 mmol/L (ref 20–29)
Calcium: 9.9 mg/dL (ref 8.7–10.2)
Chloride: 105 mmol/L (ref 96–106)
Creatinine, Ser: 1.4 mg/dL — ABNORMAL HIGH (ref 0.57–1.00)
GFR calc Af Amer: 49 mL/min/{1.73_m2} — ABNORMAL LOW (ref >59)
GFR calc non Af Amer: 43 mL/min/{1.73_m2} — ABNORMAL LOW (ref >59)
Glucose: 82 mg/dL (ref 65–99)
Potassium: 4.9 mmol/L (ref 3.5–5.2)
Sodium: 141 mmol/L (ref 134–144)

## 2019-01-20 NOTE — Telephone Encounter (Signed)
Medicare/medicaid order sent to GI. No auth they will reach out to the patient to schedule.  °

## 2019-01-20 NOTE — Telephone Encounter (Signed)
Tried to reach pt once more. LVM asking for call back. Left office hours and number in message. When pt calls back, please give her Dr. Cathren Laine message below.

## 2019-01-20 NOTE — Telephone Encounter (Signed)
Called pt & LVM asking for call back. When she calls back, please give her Dr. Cathren Laine message below.

## 2019-01-26 ENCOUNTER — Institutional Professional Consult (permissible substitution): Payer: Medicare Other | Admitting: Neurology

## 2019-01-27 ENCOUNTER — Ambulatory Visit (INDEPENDENT_AMBULATORY_CARE_PROVIDER_SITE_OTHER): Payer: Medicare Other | Admitting: Pulmonary Disease

## 2019-01-27 ENCOUNTER — Encounter: Payer: Self-pay | Admitting: Pulmonary Disease

## 2019-01-27 ENCOUNTER — Other Ambulatory Visit: Payer: Self-pay

## 2019-01-27 VITALS — BP 122/76 | HR 112 | Temp 98.0°F | Ht 63.0 in | Wt 173.8 lb

## 2019-01-27 DIAGNOSIS — F5104 Psychophysiologic insomnia: Secondary | ICD-10-CM | POA: Diagnosis not present

## 2019-01-27 MED ORDER — TEMAZEPAM 30 MG PO CAPS: 30.0000 mg | ORAL_CAPSULE | Freq: Every evening | ORAL | 3 refills | Status: DC | PRN

## 2019-01-27 NOTE — Progress Notes (Signed)
Subjective:    Patient ID: Natalie Edwards, female    DOB: February 16, 1966, 53 y.o.   MRN: [id number]  Patient with a history of chronic insomnia  Evaluated in the past about 5 years ago  She was on restoril in the past that seem to help symptoms  When she was recently seen, she was started on Restoril, Restoril 30 mg did help, 15 mg did not help She was definitely able to get better sleep with Restoril 30  Usually tries to go to bed between 8 and 9 PM Takes hours to fall asleep Wakes up routinely over 3 times during the night Final awakening time about 6 AM  She does have a history of snoring, no witnessed apneas She is only sleepy during the day when she does not get adequate rest at night Occasional dryness of her mouth in the mornings She does have headaches  No family history of sleep disordered breathing  She is an active smoker, a pack of cigarettes lasts her about 3 days She is trying to quit but not able to at present  Has a lot of stressors  Past Medical History:  Diagnosis Date  . Allergy   . Anemia   . Diverticulosis   . Gall bladder stones 1993  . Gastric ulcer   . GERD (gastroesophageal reflux disease)   . Hypertension   . Renal mass    Social History   Socioeconomic History  . Marital status: Single    Spouse name: Not on file  . Number of children: 3  . Years of education: Not on file  . Highest education level: High school graduate  Occupational History  . Not on file  Social Needs  . Financial resource strain: Not on file  . Food insecurity    Worry: Not on file    Inability: Not on file  . Transportation needs    Medical: Not on file    Non-medical: Not on file  Tobacco Use  . Smoking status: Current Every Day Smoker    Packs/day: 0.25    Years: 25.00    Pack years: 6.25    Types: Cigarettes  . Smokeless tobacco: Never Used  . Tobacco comment: Pt tried to quit - helps with anxiety   Substance and Sexual Activity  . Alcohol use: Not  Currently  . Drug use: No  . Sexual activity: Yes    Comment: hysterectomy  Lifestyle  . Physical activity    Days per week: Not on file    Minutes per session: Not on file  . Stress: Not on file  Relationships  . Social Herbalist on phone: Not on file    Gets together: Not on file    Attends religious service: Not on file    Active member of club or organization: Not on file    Attends meetings of clubs or organizations: Not on file    Relationship status: Not on file  . Intimate partner violence    Fear of current or ex partner: Not on file    Emotionally abused: Not on file    Physically abused: Not on file    Forced sexual activity: Not on file  Other Topics Concern  . Not on file  Social History Narrative   Lives at home in an apartment. Her mother lives with her.    Right handed   Caffeine: drinks approx. 36 oz of pepsi per day. Sometimes drinks 2 cups of coffee  in a day as well.    Family History  Problem Relation Age of Onset  . Cancer Maternal Uncle        Lung  . Cancer Maternal Uncle        Lung  . Cancer Maternal Uncle        Lung  . Headache Neg Hx        "I don't think so"  . Migraines Neg Hx        "I don't think so"   Review of Systems  Constitutional: Negative for fever and unexpected weight change.  HENT: Negative for congestion, dental problem, ear pain, nosebleeds, postnasal drip, rhinorrhea, sinus pressure, sneezing, sore throat and trouble swallowing.   Eyes: Negative for redness and itching.  Respiratory: Negative.   Cardiovascular: Negative.   Gastrointestinal: Negative.   Genitourinary: Negative for dysuria.  Musculoskeletal: Negative for joint swelling.  Skin: Negative for rash.  Allergic/Immunologic: Negative.  Negative for environmental allergies, food allergies and immunocompromised state.  Neurological: Positive for headaches.  Hematological: Does not bruise/bleed easily.  Psychiatric/Behavioral: Negative for dysphoric  mood. The patient is nervous/anxious.       Objective:   Physical Exam Constitutional:      Appearance: Normal appearance.  HENT:     Head: Normocephalic.     Nose: Nose normal.     Mouth/Throat:     Mouth: Mucous membranes are moist.     Comments: Mallampati 1, Eyes:     General:        Right eye: No discharge.        Left eye: No discharge.     Pupils: Pupils are equal, round, and reactive to light.  Neck:     Musculoskeletal: Normal range of motion. No neck rigidity.  Cardiovascular:     Rate and Rhythm: Normal rate and regular rhythm.     Pulses: Normal pulses.     Heart sounds: No murmur. No friction rub.  Pulmonary:     Effort: Pulmonary effort is normal. No respiratory distress.     Breath sounds: No stridor. No wheezing or rhonchi.  Neurological:     Mental Status: She is alert.    Vitals:   01/27/19 1034  BP: 122/76  Pulse: (!) 112  Temp: 98 F (36.7 C)  SpO2: 98%   Results of the Epworth flowsheet 01/06/2019  Sitting and reading 0  Watching TV 0  Sitting, inactive in a public place (e.g. a theatre or a meeting) 0  As a passenger in a car for an hour without a break 0  Lying down to rest in the afternoon when circumstances permit 0  Sitting and talking to someone 0  Sitting quietly after a lunch without alcohol 0  In a car, while stopped for a few minutes in traffic 0  Total score 0      Assessment & Plan:  Sleep onset and sleep maintenance insomnia -Restoril 30 mg did help -Not able to get adequate sleep without a sleep aid -Significant symptoms of daytime sleepiness when she does not get enough sleep, she also gets very very fatigued  History of depression and anxiety -Treatment has been optimized  Nicotine addiction -Working on quitting smoking -Counseled on the need to quit smoking -Counseled on how nicotine may be contributing to insomnia  Plan: We will increase dose of Restoril to 30 mg night  Significance of quitting smoking was  discussed with patient, being a stimulant it may be contributing to her insomnia  Importance of stimulus control discussed  Social issues may also be contributing to her insomnia   Sleep disordered breathing also discussed as possibly a contributor to sleep maintenance insomnia We did agree to go ahead and order a sleep study today  I will see her back in the office in about 3 months  Encouraged to call with any significant concerns  Smoking cessation counseling

## 2019-01-27 NOTE — Patient Instructions (Signed)
Insomnia Possible obstructive sleep apnea  We will increase your Restoril to 30 mg nightly We will order a home sleep study  I will see you back in 3 months  Call with significant concerns

## 2019-02-02 ENCOUNTER — Ambulatory Visit
Admission: EM | Admit: 2019-02-02 | Discharge: 2019-02-02 | Disposition: A | Discharge status: Home / Self Care | Payer: Medicare Other | Attending: Physician Assistant | Admitting: Physician Assistant

## 2019-02-02 DIAGNOSIS — J069 Acute upper respiratory infection, unspecified: Secondary | ICD-10-CM | POA: Diagnosis not present

## 2019-02-02 DIAGNOSIS — F17210 Nicotine dependence, cigarettes, uncomplicated: Secondary | ICD-10-CM

## 2019-02-02 DIAGNOSIS — Z20828 Contact with and (suspected) exposure to other viral communicable diseases: Secondary | ICD-10-CM | POA: Diagnosis not present

## 2019-02-02 LAB — POC SARS CORONAVIRUS 2 AG -  ED: SARS Coronavirus 2 Ag: NEGATIVE

## 2019-02-02 NOTE — ED Provider Notes (Signed)
[facility]    CSN: [id number] Arrival date & time: 02/02/19  1620      History   Chief Complaint Chief Complaint  Patient presents with  . Cough    HPI Natalie Edwards is a 53 y.o. female.   53 year old female comes in for 1 day history of URI smyptoms.  Has had rhinorrhea, throat irritation, cough, cold chills.  Denies body aches, fever.  Denies abdominal pain, diarrhea.  Has had nausea without vomiting.  Able to tolerate oral intake.  Denies chest pain, shortness of breath.  Has had changes in appetite and taste.  Current everyday smoker.  No history of inhaler use.  Has been taking allergy medicine without much relief.  No known Covid contact.     Past Medical History:  Diagnosis Date  . Allergy   . Anemia   . Diverticulosis   . Gall bladder stones 1993  . Gastric ulcer   . GERD (gastroesophageal reflux disease)   . Hypertension   . Renal mass     Patient Active Problem List   Diagnosis Date Noted  . GAD (generalized anxiety disorder) 05/07/2018  . Chronic gastric ulcer 05/07/2018  . Essential hypertension 05/07/2018  . Iron deficiency anemia due to chronic blood loss 09/23/2016  . Peptic ulcer disease with hemorrhage 09/23/2016  . Thrombocytosis ([place]) 09/09/2016  . Leukocytosis 09/09/2016  . History of hypertension 07/31/2016  . H/O ulcer disease 07/31/2016    Past Surgical History:  Procedure Laterality Date  . ABDOMINAL HYSTERECTOMY    . CHOLECYSTECTOMY      OB History   No obstetric history on file.      Home Medications    Prior to Admission medications   Medication Sig Start Date End Date Taking? Authorizing Provider  amLODipine-benazepril (LOTREL) 10-40 MG capsule Take 1 capsule by mouth daily for 30 days. 05/04/18   Scot Jun, FNP  busPIRone (BUSPAR) 10 MG tablet Take 1 tablet (10 mg total) by mouth 2 (two) times daily. 06/07/18   Scot Jun, FNP  esomeprazole (NEXIUM) 40 MG capsule TAKE 1 CAPSULE BY MOUTH 2  TIMES DAILY BEFORE A MEAL. 11/19/18   Thornton Park, MD  fluticasone (FLONASE) 50 MCG/ACT nasal spray Place 2 sprays into both nostrils daily. 12/06/18   Fulp, Cammie, MD  loratadine (CLARITIN) 10 MG tablet Take 1 tablet (10 mg total) by mouth daily. 12/06/18   Fulp, Cammie, MD  QUEtiapine (SEROQUEL) 25 MG tablet Take 1 tablet (25 mg total) by mouth at bedtime. 12/06/18   Fulp, Cammie, MD  temazepam (RESTORIL) 30 MG capsule Take 1 capsule (30 mg total) by mouth at bedtime as needed for sleep. 01/27/19   Laurin Coder, MD    Family History Family History  Problem Relation Age of Onset  . Cancer Maternal Uncle        Lung  . Cancer Maternal Uncle        Lung  . Cancer Maternal Uncle        Lung  . Headache Neg Hx        "I don't think so"  . Migraines Neg Hx        "I don't think so"    Social History Social History   Tobacco Use  . Smoking status: Current Every Day Smoker    Packs/day: 0.25    Years: 25.00    Pack years: 6.25    Types: Cigarettes  . Smokeless tobacco: Never Used  .  Tobacco comment: Pt tried to quit - helps with anxiety   Substance Use Topics  . Alcohol use: Not Currently  . Drug use: No     Allergies   Ambien [zolpidem tartrate] and Hydroxyzine   Review of Systems Review of Systems  Reason unable to perform ROS: See HPI as above.     Physical Exam Triage Vital Signs ED Triage Vitals  Enc Vitals Group     BP 02/02/19 1652 105/75     Pulse Rate 02/02/19 1652 90     Resp 02/02/19 1652 18     Temp 02/02/19 1652 98.1 F (36.7 C)     Temp Source 02/02/19 1652 Oral     SpO2 02/02/19 1652 96 %     Weight --      Height --      Head Circumference --      Peak Flow --      Pain Score 02/02/19 1653 0     Pain Loc --      Pain Edu? --      Excl. in [place]? --    No data found.  Updated Vital Signs BP 105/75 (BP Location: Left Arm)   Pulse 90   Temp 98.1 F (36.7 C) (Oral)   Resp 18   SpO2 96%   Physical Exam Constitutional:       General: She is not in acute distress.    Appearance: Normal appearance. She is not ill-appearing, toxic-appearing or diaphoretic.  HENT:     Head: Normocephalic and atraumatic.     Mouth/Throat:     Mouth: Mucous membranes are moist.     Pharynx: Oropharynx is clear. Uvula midline.  Neck:     Musculoskeletal: Normal range of motion and neck supple.  Cardiovascular:     Rate and Rhythm: Normal rate and regular rhythm.     Heart sounds: Normal heart sounds. No murmur. No friction rub. No gallop.   Pulmonary:     Effort: Pulmonary effort is normal. No accessory muscle usage, prolonged expiration, respiratory distress or retractions.     Comments: Lungs clear to auscultation without adventitious lung sounds. Neurological:     General: No focal deficit present.     Mental Status: She is alert and oriented to person, place, and time.      UC Treatments / Results  Labs (all labs ordered are listed, but only abnormal results are displayed) Labs Reviewed  POC SARS CORONAVIRUS 2 ED - Normal  NOVEL CORONAVIRUS, NAA    EKG   Radiology No results found.  Procedures Procedures (including critical care time)  Medications Ordered in UC Medications - No data to display  Initial Impression / Assessment and Plan / UC Course  I have reviewed the triage vital signs and the nursing notes.  Pertinent labs & imaging results that were available during my care of the patient were reviewed by me and considered in my medical decision making (see chart for details).    Rapid COVID negative. PCR test ordered. Patient to quarantine until testing results return. No alarming signs on exam.  Patient speaking in full sentences without respiratory distress.  Symptomatic treatment discussed.  Push fluids.  Return precautions given.  Patient expresses understanding and agrees to plan.  Final Clinical Impressions(s) / UC Diagnoses   Final diagnoses:  Viral URI   ED Prescriptions    None     PDMP  not reviewed this encounter.   Natalie Edwards, PA-C 02/02/19 1753

## 2019-02-02 NOTE — ED Triage Notes (Signed)
Pt c/o cough, throat burning, headache and chills since this am and has became worse during the day. Pt c/o loss of appetite and taste

## 2019-02-02 NOTE — Discharge Instructions (Addendum)
Rapid COVID negative. PCR testing ordered. I would like you to quarantine until testing results. You can take over the counter flonase/nasacort to help with nasal congestion/drainage. If experiencing shortness of breath, trouble breathing, go to the emergency department for further evaluation needed.

## 2019-02-03 LAB — NOVEL CORONAVIRUS, NAA: SARS-CoV-2, NAA: NOT DETECTED

## 2019-03-02 ENCOUNTER — Ambulatory Visit: Payer: Medicare Other

## 2019-03-03 ENCOUNTER — Telehealth: Payer: Self-pay | Admitting: Hematology and Oncology

## 2019-03-03 NOTE — Telephone Encounter (Signed)
[facility] transfer to [place]. Confirmed 12/22 lab/fu with patient.

## 2019-03-07 ENCOUNTER — Other Ambulatory Visit: Payer: Self-pay | Admitting: Hematology and Oncology

## 2019-03-07 ENCOUNTER — Other Ambulatory Visit: Payer: Self-pay | Admitting: *Deleted

## 2019-03-07 DIAGNOSIS — D500 Iron deficiency anemia secondary to blood loss (chronic): Secondary | ICD-10-CM

## 2019-03-07 NOTE — Progress Notes (Signed)
[facility] [phone]   [phone]  PROGRESS NOTE  Patient Care Team: Patient, No Pcp Per as PCP - General (General Practice)  Hematological/Oncological History  #Leukocytosis #Thrombocytosis #Iron Deficiency 1) 09/09/2016: MPN testing with JAK2 reflex to MPL and CLR. This testing was negative for a mutation. BCR/ABL not tested.  2) 09/23/2016: establish care with Dr. Lebron Conners 3) 06/03/2018: WBC 15.9 HB 12.4 plts 557 4) 07/28/2018: WBC 12.8 HB 11.3 plts 455. Sed rate 16 (nml). Feraheme ordered, but patient never received the iron. [name]  5) Establish care with Dr. Lorenso Courier   Interval History:  Natalie Edwards 53 y.o. female with medical history significant for iron deficiency anemia presents for a follow up visit. She was last seen on 08/04/2018 by Dr. Walden Field.   In the interim since her last visit Natalie Edwards has received no iron supplementation. She was scheduled for IV iron, but this appointment was not kept and the iron was not administered. She has had no hospitalizations or major changes in health since that time.  On exam today Ms. Wollenberg notes that she feels well.  She notes that she is always feeling cold, and also having issues with insomnia and not sleeping throughout the night.  She is currently working with her primary care provider to address her issues with insomnia.  On further discussion she notes that she never received any IV iron, nor any p.o. iron supplementation since her last visit in May.  She has never been on iron before in the past.  She reports that she currently has no active sites of bleeding, other than occasional gum bleeding with brushing her teeth.  She denies any bruising, dark stools, or nosebleeds.  She notes that she has had no GYN bleeding since hysterectomy she had in 2007.    She does report having a stomach ulcer which alters her diet, and causes her to avoid spicy foods as well as acidic foods including oranges.  She does  report craving ice.  She otherwise eats a normal diet consisting of meat and vegetables, and is an active eater of pork and red meat. She is currently an active smoker but has a strong desire to quit.  Today she had no additional questions or concerns.  A full 10 point ROS is listed below.  MEDICAL HISTORY:  Past Medical History:  Diagnosis Date  . Allergy   . Anemia   . Diverticulosis   . Gall bladder stones 1993  . Gastric ulcer   . GERD (gastroesophageal reflux disease)   . Hypertension   . Renal mass     SURGICAL HISTORY: Past Surgical History:  Procedure Laterality Date  . ABDOMINAL HYSTERECTOMY    . CHOLECYSTECTOMY      SOCIAL HISTORY: Social History   Socioeconomic History  . Marital status: Single    Spouse name: Not on file  . Number of children: 3  . Years of education: Not on file  . Highest education level: High school graduate  Occupational History  . Not on file  Tobacco Use  . Smoking status: Current Every Day Smoker    Packs/day: 0.25    Years: 25.00    Pack years: 6.25    Types: Cigarettes  . Smokeless tobacco: Never Used  . Tobacco comment: Pt tried to quit - helps with anxiety   Substance and Sexual Activity  . Alcohol use: Not Currently  . Drug use: No  . Sexual activity: Yes    Comment: hysterectomy  Other Topics Concern  . Not on file  Social History Narrative   Lives at home in an apartment. Her mother lives with her.    Right handed   Caffeine: drinks approx. 36 oz of pepsi per day. Sometimes drinks 2 cups of coffee in a day as well.    Social Determinants of Health   Financial Resource Strain:   . Difficulty of Paying Living Expenses: Not on file  Food Insecurity:   . Worried About Charity fundraiser in the Last Year: Not on file  . Ran Out of Food in the Last Year: Not on file  Transportation Needs:   . Lack of Transportation (Medical): Not on file  . Lack of Transportation (Non-Medical): Not on file  Physical Activity:   .  Days of Exercise per Week: Not on file  . Minutes of Exercise per Session: Not on file  Stress:   . Feeling of Stress : Not on file  Social Connections:   . Frequency of Communication with Friends and Family: Not on file  . Frequency of Social Gatherings with Friends and Family: Not on file  . Attends Religious Services: Not on file  . Active Member of Clubs or Organizations: Not on file  . Attends Archivist Meetings: Not on file  . Marital Status: Not on file  Intimate Partner Violence:   . Fear of Current or Ex-Partner: Not on file  . Emotionally Abused: Not on file  . Physically Abused: Not on file  . Sexually Abused: Not on file    FAMILY HISTORY: Family History  Problem Relation Age of Onset  . Cancer Maternal Uncle        Lung  . Cancer Maternal Uncle        Lung  . Cancer Maternal Uncle        Lung  . Headache Neg Hx        "I don't think so"  . Migraines Neg Hx        "I don't think so"    ALLERGIES:  is allergic to [employer] tartrate] and hydroxyzine.  MEDICATIONS:  Current Outpatient Medications  Medication Sig Dispense Refill  . amLODipine-benazepril (LOTREL) 10-40 MG capsule Take 1 capsule by mouth daily for 30 days. 30 capsule 11  . busPIRone (BUSPAR) 10 MG tablet Take 1 tablet (10 mg total) by mouth 2 (two) times daily. 60 tablet 1  . esomeprazole (NEXIUM) 40 MG capsule TAKE 1 CAPSULE BY MOUTH 2 TIMES DAILY BEFORE A MEAL. 60 capsule 3  . fluticasone (FLONASE) 50 MCG/ACT nasal spray Place 2 sprays into both nostrils daily. 16 g 6  . Iron, Ferrous Sulfate, 325 (65 Fe) MG TABS Take 325 mg by mouth daily. 90 tablet 1  . loratadine (CLARITIN) 10 MG tablet Take 1 tablet (10 mg total) by mouth daily. 30 tablet 11  . QUEtiapine (SEROQUEL) 25 MG tablet Take 1 tablet (25 mg total) by mouth at bedtime. 30 tablet 3  . temazepam (RESTORIL) 30 MG capsule Take 1 capsule (30 mg total) by mouth at bedtime as needed for sleep. 30 capsule 3   No current  facility-administered medications for this visit.    REVIEW OF SYSTEMS:   Constitutional: ( - ) fevers, ( - )  chills , ( - ) night sweats (+) insomnia Eyes: ( - ) blurriness of vision, ( - ) double vision, ( - ) watery eyes Ears, nose, mouth, throat, and face: ( - ) mucositis, ( - )  sore throat Respiratory: ( - ) cough, ( - ) dyspnea, ( - ) wheezes Cardiovascular: ( - ) palpitation, ( - ) chest discomfort, ( - ) lower extremity swelling Gastrointestinal:  ( - ) nausea, ( - ) heartburn, ( - ) change in bowel habits Skin: ( - ) abnormal skin rashes Lymphatics: ( - ) new lymphadenopathy, ( - ) easy bruising Neurological: ( - ) numbness, ( - ) tingling, ( - ) new weaknesses Behavioral/Psych: ( - ) mood change, ( - ) new changes  All other systems were reviewed with the patient and are negative.  PHYSICAL EXAMINATION: ECOG PERFORMANCE STATUS: 0 - Asymptomatic  Vitals:   03/08/19 0808  BP: 128/89  Pulse: 90  Resp: 16  Temp: 98.1 F (36.7 C)  SpO2: 98%   Filed Weights   03/08/19 0808  Weight: 173 lb 6.4 oz (78.7 kg)    GENERAL: well appearing middle aged [place] American female in NAD  SKIN: skin color, texture, turgor are normal, no rashes or significant lesions EYES: conjunctiva are pink and non-injected, sclera clear LUNGS: clear to auscultation and percussion with normal breathing effort HEART: regular rate & rhythm and no murmurs and no lower extremity edema ABDOMEN: soft, non-tender, non-distended, normal bowel sounds Musculoskeletal: no cyanosis of digits and no clubbing  PSYCH: alert & oriented x 3, fluent speech NEURO: no focal motor/sensory deficits  LABORATORY DATA:  I have reviewed the data as listed Lab Results  Component Value Date   WBC 12.7 (H) 03/08/2019   HGB 12.3 03/08/2019   HCT 37.7 03/08/2019   MCV 88.3 03/08/2019   PLT 493 (H) 03/08/2019   NEUTROABS 6.5 03/08/2019    BLOOD FILM:  Review of the peripheral blood smear showed normal appearing  white cells with neutrophils that were appropriately lobated and granulated. There was no predominance of bi-lobed or hyper-segmented neutrophils appreciated. No Dohle bodies were noted. There was no left shifting, immature forms or blasts noted. Lymphocytes remain normal in size without any predominance of large granular lymphocytes. Red cells show no anisopoikilocytosis, macrocytes , microcytes or polychromasia. There were target cells, but no schistocytes,echinocytes, acanthocytes, dacrocytes, or stomatocytes.There was no rouleaux formation, nucleated red cells, or intra-cellular inclusions noted. The platelets are normal in size, shape, and color without any clumping evident.  RADIOGRAPHIC STUDIES: None relevant to review.  No results found.  ASSESSMENT & PLAN Natalie Edwards 53 y.o. female with medical history significant for iron deficiency anemia presents for a follow up visit.  After review of the labs and discussion with the patient her findings are most consistent with an iron deficiency without current anemia.  The patient was scheduled for IV iron in May of this year, however due to scheduling conflicts this was not performed and the patient received no iron supplementation.  On review today the patient still has some symptoms of iron deficiency including fatigue and ice craving.  On review her iron studies show low serum iron with a high normal TIBC a 9% sat and ferritin 42.  Given these findings I will recommend p.o. iron supplementation with iron sulfate and reevaluation in 3 months time to determine if her iron studies improve, her platelet count decreases, and her ice craving subsist.  Additionally she has a longstanding leukocytosis which dates back to at least 2018 prior records.  On discussion with the patient she is currently an active smoker and that could potentially explain the findings we are seeing here.  If with appropriate iron supplementation  she continues to have a thrombocytosis  I think it would be reasonable to further upper MPN work-up with testing of BCR ABL.  She previously had MPN testing with Dr. Lebron Conners which showed none of the classical mutations including JAK2, MPL, and CAL R.   Relevant studies:  09/12/2016: EGD with non-bleeding ulcer.   #Iron Deficiency without Anemia #Thrombocytosis, stable --today will reorder CBC, CMP, iron panel and ferritin  --on review of prior labs the patient was iron deficient but never received supplementation --will order PO iron sulfate 325mg  daily with 500mg  vitamin C tablets (patient cannot tolerate OJ due to peptic ulcers) --source of bleeding is thought to be GI from gastric ulcer bleeding. Patient does not have any further GYN bleeding since hysterectomy in 2007.  --RTC in 3 months time to assure PO iron has corrected the deficiency.   #Leukocytosis, stable --present and stable on labs for the last 2 years --likely 2/2 to smoking, though in combination with thrombocytosis may indicate an MPN such as CML (tested negative for JAK2, MPL, and CAL R prior) --serum inflammatory markers today. --continue to monitor.   All questions were answered. The patient knows to call the clinic with any problems, questions or concerns.  A total of more than 40 minutes were spent on this encounter and over half of that time was spent on counseling and coordination of care as outlined above.   Ledell Peoples, MD Department of Hematology/Oncology [place] at [facility] Phone: [phone] Pager: [phone] Email: [email or web address]  03/08/2019 6:38 PM

## 2019-03-08 ENCOUNTER — Inpatient Hospital Stay: Payer: Medicare Other | Attending: Hematology and Oncology | Admitting: Hematology and Oncology

## 2019-03-08 ENCOUNTER — Other Ambulatory Visit: Payer: Self-pay

## 2019-03-08 ENCOUNTER — Inpatient Hospital Stay: Payer: Medicare Other

## 2019-03-08 VITALS — BP 128/89 | HR 90 | Temp 98.1°F | Resp 16 | Ht 63.0 in | Wt 173.4 lb

## 2019-03-08 DIAGNOSIS — D72829 Elevated white blood cell count, unspecified: Secondary | ICD-10-CM | POA: Insufficient documentation

## 2019-03-08 DIAGNOSIS — Z801 Family history of malignant neoplasm of trachea, bronchus and lung: Secondary | ICD-10-CM | POA: Diagnosis not present

## 2019-03-08 DIAGNOSIS — D72828 Other elevated white blood cell count: Secondary | ICD-10-CM

## 2019-03-08 DIAGNOSIS — D75839 Thrombocytosis, unspecified: Secondary | ICD-10-CM

## 2019-03-08 DIAGNOSIS — F17210 Nicotine dependence, cigarettes, uncomplicated: Secondary | ICD-10-CM | POA: Insufficient documentation

## 2019-03-08 DIAGNOSIS — D509 Iron deficiency anemia, unspecified: Secondary | ICD-10-CM | POA: Diagnosis not present

## 2019-03-08 DIAGNOSIS — Z90710 Acquired absence of both cervix and uterus: Secondary | ICD-10-CM | POA: Insufficient documentation

## 2019-03-08 DIAGNOSIS — D500 Iron deficiency anemia secondary to blood loss (chronic): Secondary | ICD-10-CM

## 2019-03-08 DIAGNOSIS — I10 Essential (primary) hypertension: Secondary | ICD-10-CM | POA: Insufficient documentation

## 2019-03-08 DIAGNOSIS — Z79899 Other long term (current) drug therapy: Secondary | ICD-10-CM | POA: Diagnosis not present

## 2019-03-08 DIAGNOSIS — D473 Essential (hemorrhagic) thrombocythemia: Secondary | ICD-10-CM

## 2019-03-08 LAB — CBC WITH DIFFERENTIAL (CANCER CENTER ONLY)
Abs Immature Granulocytes: 0.05 10*3/uL (ref 0.00–0.07)
Basophils Absolute: 0 10*3/uL (ref 0.0–0.1)
Basophils Relative: 0 %
Eosinophils Absolute: 0.2 10*3/uL (ref 0.0–0.5)
Eosinophils Relative: 1 %
HCT: 37.7 % (ref 36.0–46.0)
Hemoglobin: 12.3 g/dL (ref 12.0–15.0)
Immature Granulocytes: 0 %
Lymphocytes Relative: 39 %
Lymphs Abs: 4.9 10*3/uL — ABNORMAL HIGH (ref 0.7–4.0)
MCH: 28.8 pg (ref 26.0–34.0)
MCHC: 32.6 g/dL (ref 30.0–36.0)
MCV: 88.3 fL (ref 80.0–100.0)
Monocytes Absolute: 1 10*3/uL (ref 0.1–1.0)
Monocytes Relative: 8 %
Neutro Abs: 6.5 10*3/uL (ref 1.7–7.7)
Neutrophils Relative %: 52 %
Platelet Count: 493 10*3/uL — ABNORMAL HIGH (ref 150–400)
RBC: 4.27 MIL/uL (ref 3.87–5.11)
RDW: 16.1 % — ABNORMAL HIGH (ref 11.5–15.5)
WBC Count: 12.7 10*3/uL — ABNORMAL HIGH (ref 4.0–10.5)
nRBC: 0 % (ref 0.0–0.2)

## 2019-03-08 LAB — CMP (CANCER CENTER ONLY)
ALT: 27 U/L (ref 0–44)
AST: 20 U/L (ref 15–41)
Albumin: 4.1 g/dL (ref 3.5–5.0)
Alkaline Phosphatase: 78 U/L (ref 38–126)
Anion gap: 13 (ref 5–15)
BUN: 20 mg/dL (ref 6–20)
CO2: 24 mmol/L (ref 22–32)
Calcium: 9.8 mg/dL (ref 8.9–10.3)
Chloride: 106 mmol/L (ref 98–111)
Creatinine: 1.37 mg/dL — ABNORMAL HIGH (ref 0.44–1.00)
GFR, Est AFR Am: 51 mL/min — ABNORMAL LOW (ref >60)
GFR, Estimated: 44 mL/min — ABNORMAL LOW (ref >60)
Glucose, Bld: 84 mg/dL (ref 70–99)
Potassium: 4.7 mmol/L (ref 3.5–5.1)
Sodium: 143 mmol/L (ref 135–145)
Total Bilirubin: <0.2 mg/dL — ABNORMAL LOW (ref 0.3–1.2)
Total Protein: 8.3 g/dL — ABNORMAL HIGH (ref 6.5–8.1)

## 2019-03-08 LAB — IRON AND TIBC
Iron: 34 ug/dL — ABNORMAL LOW (ref 41–142)
Saturation Ratios: 9 % — ABNORMAL LOW (ref 21–57)
TIBC: 388 ug/dL (ref 236–444)
UIBC: 353 ug/dL (ref 120–384)

## 2019-03-08 LAB — RETIC PANEL
Immature Retic Fract: 14.3 % (ref 2.3–15.9)
RBC.: 4.22 MIL/uL (ref 3.87–5.11)
Retic Count, Absolute: 61.2 10*3/uL (ref 19.0–186.0)
Retic Ct Pct: 1.5 % (ref 0.4–3.1)
Reticulocyte Hemoglobin: 33.1 pg (ref >27.9)

## 2019-03-08 LAB — SEDIMENTATION RATE: Sed Rate: 38 mm/hr — ABNORMAL HIGH (ref 0–22)

## 2019-03-08 LAB — SAVE SMEAR(SSMR), FOR PROVIDER SLIDE REVIEW

## 2019-03-08 LAB — FERRITIN: Ferritin: 42 ng/mL (ref 11–307)

## 2019-03-08 LAB — C-REACTIVE PROTEIN: CRP: 0.6 mg/dL (ref <1.0)

## 2019-03-08 LAB — LACTATE DEHYDROGENASE: LDH: 204 U/L — ABNORMAL HIGH (ref 98–192)

## 2019-03-08 MED ORDER — IRON (FERROUS SULFATE) 325 (65 FE) MG PO TABS: 325.0000 mg | ORAL_TABLET | Freq: Every day | ORAL | 1 refills | Status: DC

## 2019-03-09 ENCOUNTER — Telehealth: Payer: Self-pay | Admitting: Hematology and Oncology

## 2019-03-09 NOTE — Telephone Encounter (Signed)
Scheduled per los. Called and spoke with patient. Confirmed appt 

## 2019-04-01 ENCOUNTER — Telehealth: Payer: Self-pay

## 2019-04-01 NOTE — Telephone Encounter (Signed)
Called patient to do their pre-visit COVID screening.  Patient states that she needs an appointment after 3:30 PM due to her work schedule. Appointment rescheduled.

## 2019-04-04 ENCOUNTER — Ambulatory Visit: Payer: Medicare Other

## 2019-04-06 ENCOUNTER — Telehealth: Payer: Self-pay

## 2019-04-06 NOTE — Telephone Encounter (Signed)

## 2019-04-07 ENCOUNTER — Other Ambulatory Visit: Payer: Self-pay

## 2019-04-07 ENCOUNTER — Ambulatory Visit (INDEPENDENT_AMBULATORY_CARE_PROVIDER_SITE_OTHER): Payer: Medicare Other | Admitting: Internal Medicine

## 2019-04-07 ENCOUNTER — Encounter: Payer: Self-pay | Admitting: Internal Medicine

## 2019-04-07 VITALS — BP 100/70 | HR 98 | Temp 97.3°F | Resp 17 | Wt 177.0 lb

## 2019-04-07 DIAGNOSIS — N189 Chronic kidney disease, unspecified: Secondary | ICD-10-CM | POA: Insufficient documentation

## 2019-04-07 DIAGNOSIS — F411 Generalized anxiety disorder: Secondary | ICD-10-CM

## 2019-04-07 DIAGNOSIS — G4700 Insomnia, unspecified: Secondary | ICD-10-CM

## 2019-04-07 DIAGNOSIS — I10 Essential (primary) hypertension: Secondary | ICD-10-CM

## 2019-04-07 NOTE — Progress Notes (Signed)
Subjective:    Mileah Teters - 54 y.o. female MRN [id number]  Date of birth: 03-22-1965  HPI  Melinda Dube is here for follow up of HTN. Additionally, has concerns about her mood.    Chronic HTN Disease Monitoring:  Home BP Monitoring - Does not monitor  Chest pain- no  Dyspnea- no Headache - no  Medications: Amlodipine-Lisinopril 10-40 mg, HCTZ 25 mg  Compliance- yes Lightheadedness- no  Edema- no    GAD: Patient with h/o GAD. Unsure of what medications she is on for anxiety. On medication list, Buspar and Seroquel are reported. She is also taking Temazepam for sleep prescribed by sleep doctor. Has long standing insomnia and has been on several different medications in past including Ambien and Trazodone. She has an in home sleep study scheduled. Used to be followed by psychiatrist back in [place]. Saw [place], LCSW for a few sessions last spring but no other recent therapy.   Patient reports a lot of stress around taking care of her elderly mother. Essentially confined to the house when her mother's [facility] is not at the house. She does try to work despite having disability. After a few hours at work she wants to go back to her room. Even when she is in the house with her mother she essentially just stays in her room. Has no social life.   Attempted suicide about 12-13 years ago by taking a lot of pills. Thoughts of suicide cross her mind frequently but she has no active plans. When she thinks about taking pills again, she knows she can't because she can't leave her mother alone. Reports she would tell someone if she was actively planning to harm herself.   PHQ-9: 22  GAD-7: 21   Depression screen [facility] 2/9 04/07/2019 09/09/2018 06/07/2018 05/04/2018 04/14/2016  Decreased Interest 2 2 3 3  0  Down, Depressed, Hopeless 3 2 3 3  0  PHQ - 2 Score 5 4 6 6  0  Altered sleeping 3 1 3 3  -  Tired, decreased energy 3 1 3 3  -  Change in appetite 1 2 3 3  -  Feeling bad or failure about  yourself  3 2 3 3  -  Trouble concentrating 3 1 2 3  -  Moving slowly or fidgety/restless 1 0 0 1 -  Suicidal thoughts 3 1 3 3  -  PHQ-9 Score 22 12 23 25  -  Difficult doing work/chores Extremely dIfficult - - - -   GAD 7 : Generalized Anxiety Score 04/07/2019 09/09/2018 06/07/2018 05/04/2018  Nervous, Anxious, on Edge 3 2 3 3   Control/stop worrying 3 2 3 3   Worry too much - different things 3 2 3 3   Trouble relaxing 3 2 3 3   Restless 3 2 3 3   Easily annoyed or irritable 3 2 3 3   Afraid - awful might happen 3 2 3 3   Total GAD 7 Score 21 14 21 21       -  reports that she has been smoking cigarettes. She has a 6.25 pack-year smoking history. She has never used smokeless tobacco. - Review of Systems: Per HPI. - Past Medical History: Patient Active Problem List   Diagnosis Date Noted  . CKD (chronic kidney disease) 04/07/2019  . GAD (generalized anxiety disorder) 05/07/2018  . Chronic gastric ulcer 05/07/2018  . Essential hypertension 05/07/2018  . Iron deficiency anemia due to chronic blood loss 09/23/2016  . Peptic ulcer disease with hemorrhage 09/23/2016  . Thrombocytosis ([place]) 09/09/2016  . Leukocytosis 09/09/2016  .  History of hypertension 07/31/2016  . H/O ulcer disease 07/31/2016   - Medications: reviewed and updated   Objective:   Physical Exam BP 100/70   Pulse 98   Temp (!) 97.3 F (36.3 C) (Temporal)   Resp 17   Wt 177 lb (80.3 kg)   SpO2 95%   BMI 31.35 kg/m  Physical Exam  Constitutional: She is oriented to person, place, and time and well-developed, well-nourished, and in no distress. No distress.  HENT:  Head: Normocephalic and atraumatic.  Eyes: Conjunctivae and EOM are normal.  Cardiovascular: Normal rate, regular rhythm and normal heart sounds.  No murmur heard. Pulmonary/Chest: Effort normal and breath sounds normal. No respiratory distress.  Musculoskeletal:        General: Normal range of motion.  Neurological: She is alert and oriented to person,  place, and time.  Skin: Skin is warm and dry. She is not diaphoretic.  Psychiatric: Judgment normal.  Sad affect.            Assessment & Plan:   1. GAD (generalized anxiety disorder) Contracted for safety given thoughts of SI. Suspect patient also likely suffering from MDD given elevated PHQ-9 score. Patient has had significantly elevated scores in the recent past. Would like to refer to psych for medication management. Patient would also certainly benefit from ongoing psychotherapy. She is scheduled to speak to [place], [place] tomorrow AM for short term assistance. I have provided patient with a list of resources for acute psychiatric help as well as therapy offices in town that accept Medicare and Medicaid. I have encouraged her to establish with a therapist for longer term management.  - Ambulatory referral to Psychiatry  2. Essential hypertension BP at goal today. Continue current regimen.  Counseled on blood pressure goal of less than 130/80, low-sodium, DASH diet, medication compliance, 150 minutes of moderate intensity exercise per week. Discussed medication compliance, adverse effects.  3. Insomnia, unspecified type Has appointment with sleep doctor on 1/25. Management per that physician.      Phill Myron, D.O. 04/07/2019, 3:36 PM Primary Care at [facility]

## 2019-04-07 NOTE — Patient Instructions (Signed)
Behavioral Health Resources:   What if I or someone I know is in crisis?  . If you are thinking about harming yourself or having thoughts of suicide, or if you know someone who is, seek help right away.  . Call your doctor or mental health care provider.  . Call 911 or go to a hospital emergency room to get immediate help, or ask a friend or family member to help you do these things.  . Call the [place] National Suicide Prevention Lifeline's toll-free, 24-hour hotline at 1-800-273-TALK (1-800-273-8255) or TTY: 1-800-799-4 TTY (1-800-799-4889) to talk to a trained counselor.  . If you are in crisis, make sure you are not left alone.   . If someone else is in crisis, make sure he or she is not left alone   24 Hour :   [place] National Suicide Hotline: 1-800-273-8255  Therapeutic Alternative Mobile Crisis: 1-877-626-1772   [facility]  [address], [place], [place] [address]  800-711-2635 or 336-832-9700  [facility] Crisis Line (Domestic Violence, Rape & Victim Assistance)  336-273-7273  [facility]  [address] [place], [place]  [address]   1-855-788-8787 or 336-676-6840   [facility] [place] [facility] Crisis Services: 336-899-1505 (8am-4pm) or 1-866- 261-5769 (after hours)           [facility], [address], [place], [place]  1-800-711-2635 Fax: 336-832-9701 [email or web address]  *Interpreters available *Accepts Medicaid, Medicare, uninsured  [facility]   Mon-Fri: 8am-5pm [address], [place], [place] [phone](phone); 336-272-9885(fax) www.[email or web address]  *Accepts Medicare  [facility] Mon, Tues, Thurs, Fri: 8am-4pm [address], [place], [place]  [phone] (phone); 336-256-0121 (fax) www.[email or web address]  *Accepts Medicare  [facility] Mon-Fri: 9am-5pm  [address], [place], [place] [phone] (phone); 336-540-9454  www.[email or web address]  *Accepts Medicaid  [facility] [address], [place], [place]  336-271-5888  [email or web address]   [facility] Mon-Fri, 8:30am-12pm/1pm-2:30pm [address], [place], [place] [phone] (phone); [phone] (fax) www.[email or web address]  *Accepts Medicaid, sliding-scale*Bilingual services available  [facility] Mon-Fri, 8am-7pm [address], [place], [place]  [phone](phone); 336-899-8811(fax) www.[email or web address]  *Accepts Medicaid *Bilingual services available  [facility] Mon-Fri: 8am-5pm, Saturday by appointment only [address], [place], [place] [phone] (phone); [phone] (fax) [email or web address]   [facility] [address], [place], [place] [phone] www.[email or web address]  *Free & reduced services for uninsured and underinsured individuals *Bilingual services for Spanish-speaking clients 21 and under  [facility], [address], [place], [place] 336-676-6409(phone); 336-676-6409(fax) www.[facility].org  *Bring your own interpreter at first visit *Accepts Medicare and Medicaid  [facility] Mon-Fri: 9am-5:30pm [address], [place], [place] [phone] (phone), [phone] (fax) After hours crisis line: 336-763-1165 www.[email or web address]  *Accepts Medicare and Medicaid  [facility] Mon-Thurs, 8am-6pm [address], [place], [place]  [phone] (phone); [phone] (fax) http://[email or web address]  *Subsidized costs available  [facility] Mon-Fri: 8am-4pm [address], [place],  [place] [phone](phone); 336-834-9698(fax) www.[email or web address]  *Accepts Medicaid  [facility] Same day access hours: Mon-Fri, 8:30-3pm Crisis hours: Mon-Fri, 8am-5pm [address], [place], [place]  [facility] Same day access hours: Mon-Fri, 8:30-3pm Crisis hours: Mon-Fri, 8am-8pm [address], [place],  [phone] (phone); [phone] (fax) www.[email or web address]  *Accepts Medicaid and Medicare  [facility] Mon, Wed, Fri: 9am-9pm Tues, Thurs: 9am-6pm [address],   [place], [place]  [phone] (phone); [phone] (fax) [email or web address]://[email or web address]  *(Accepts Medicare and Medicaid; payment plans available)*Bilingual services available  [facility] [address], [place], [place] [phone] (phone); [phone] (fax) www.[email or web address]   [facility] [address], [place], [place]  [phone]  www.[email or web address]  *Bilingual services available  [facility] Mon-Thurs: 8am-8pm [address], [place], [place] [phone] (phone); [phone] (fax) [email or web address]  *Accepts Medicaid*Bilingual services available  [facility] [address], [place], [place] [phone] (phone) [email or web address]://[email or web address]  *Accepts Medicaid *Bilingual services available  [facility] Mon-Fri, 9am-4:45pm [address], [place], [place] [phone] (phone); [phone] (fax) [email or web address]://[email or web address]  *Accepts Medicare  [facility] Mon-Thurs: 8:30-8pm, Fri: 8:30am-7pm [address], [place], [place] (3rd floor) [phone] (phone); [phone] (fax) [email or web address]  *Accepts Medicaid; income-based reduced rates available  [facility] Mon-Fri: 8am-5pm [address], [place],  [place] [phone] (phone); [phone] (fax) [email or web address]://[email or web address]  *Accepts Medicaid*Bilingual services available  [facility] [address], [place],   [phone] (phone); [phone] (fax) www.[email or web address]  *Free emergency housing and clinical services for youth in crisis  [facility] (Mental Health Association of )  [address],  [phone] www.mhag.org  *Provides direct services to individuals in recovery from mental illness, including support groups, recovery skills classes, and one on one peer support  NAMI (National Alliance on Mental Illness) [place] NAMI helpline: 336-370-4264  https://namiguil[facility].org  *A community hub for information relating to local resources and services for the friends and families of individuals living alongside a mental health condition, as well as the individuals themselves. Classes and support groups also provided     

## 2019-04-08 ENCOUNTER — Ambulatory Visit: Payer: Medicare Other | Admitting: Licensed Clinical Social Worker

## 2019-04-11 ENCOUNTER — Other Ambulatory Visit: Payer: Self-pay

## 2019-04-11 ENCOUNTER — Other Ambulatory Visit: Payer: Self-pay | Admitting: Pulmonary Disease

## 2019-04-11 ENCOUNTER — Ambulatory Visit: Payer: Medicare Other | Admitting: Licensed Clinical Social Worker

## 2019-04-11 ENCOUNTER — Ambulatory Visit: Payer: Medicare Other

## 2019-04-11 DIAGNOSIS — F5104 Psychophysiologic insomnia: Secondary | ICD-10-CM

## 2019-04-11 DIAGNOSIS — G4709 Other insomnia: Secondary | ICD-10-CM | POA: Diagnosis not present

## 2019-04-11 NOTE — Telephone Encounter (Signed)
Pt called requesting to have temazepam 15mg  refilled. Dr. Ander Slade, please advise if you are okay refillign Rx for pt sending it to [facility].

## 2019-04-12 DIAGNOSIS — G4709 Other insomnia: Secondary | ICD-10-CM | POA: Diagnosis not present

## 2019-04-13 MED ORDER — TEMAZEPAM 30 MG PO CAPS: 30.0000 mg | ORAL_CAPSULE | Freq: Every evening | ORAL | 3 refills | Status: DC | PRN

## 2019-04-13 NOTE — Telephone Encounter (Signed)
Yes, okay to refill 

## 2019-04-14 ENCOUNTER — Ambulatory Visit: Payer: Medicare Other | Attending: Family Medicine | Admitting: Licensed Clinical Social Worker

## 2019-04-14 ENCOUNTER — Other Ambulatory Visit: Payer: Self-pay

## 2019-04-14 DIAGNOSIS — F411 Generalized anxiety disorder: Secondary | ICD-10-CM

## 2019-04-14 NOTE — BH Specialist Note (Signed)
Integrated Behavioral Health Visit via Telemedicine (Telephone)  04/14/2019 Macaila Dilorenzo [id number]   Session Start time: 3:40 PM  Session End time: 4:15 PM Total time: 35   Referring Provider: Dr. Juleen China Type of Visit: Telephonic Patient location: Home [facility] Provider location: Office All persons participating in visit: LCSW and patient  Confirmed patient's address: Yes  Confirmed patient's phone number: Yes  Any changes to demographics: No   Confirmed patient's insurance: Yes  Any changes to patient's insurance: No   Discussed confidentiality: Yes    The following statements were read to the patient and/or legal guardian that are established with the [facility] Provider.  "The purpose of this phone visit is to provide behavioral health care while limiting exposure to the coronavirus (COVID19).  There is a possibility of technology failure and discussed alternative modes of communication if that failure occurs."  "By engaging in this telephone visit, you consent to the provision of healthcare.  Additionally, you authorize for your insurance to be billed for the services provided during this telephone visit."   Patient and/or legal guardian consented to telephone visit: Yes   PRESENTING CONCERNS: Patient and/or family reports the following symptoms/concerns: Pt reports increase in anxiety. Symptoms include worry, withdrawn behavior, and difficulty sleeping Duration of problem: ongoing; Severity of problem: moderate  STRENGTHS (Protective Factors/Coping Skills): Pt has desire to change  GOALS ADDRESSED: Patient will: 1.  Reduce symptoms of: anxiety  2.  Increase knowledge and/or ability of: self-management skills  3.  Demonstrate ability to: Increase healthy adjustment to current life circumstances and Increase adequate support systems for patient/family  INTERVENTIONS: Interventions utilized:  Solution-Focused Strategies and Supportive Counseling Standardized  Assessments completed: Not Needed  ASSESSMENT: Patient currently experiencing increase in anxiety. Symptoms include worry, withdrawn behavior, and difficulty sleeping.  Patient may benefit from psychotherapy and medication management. Validation and encouragement provided. Healthy coping skills were identified and sleep hygiene was discussed (Pt recently completed sleep study on 04/11/19). LCSW will contact [facility] to follow up on psychiatry referral, per pt request  PLAN: 1. Follow up with behavioral health clinician on : LCSW will follow up with patient regarding psychiatry referral 2. Behavioral recommendations: Utilize strategies discussed 3. Referral(s): [place] (In Clinic)  Rebekah Chesterfield, [place] 04/15/19 6:25 PM

## 2019-04-15 ENCOUNTER — Telehealth: Payer: Self-pay | Admitting: Pulmonary Disease

## 2019-04-15 NOTE — Telephone Encounter (Signed)
Called and spoke with Patient.  Dr. Judson Roch results and recommendations given.  Understanding stated.  Nothing further at this time.    Dr. Ander Slade has reviewed the home sleep test this test was negative for sleep apnea.  Dr . Ander Slade recommendation are encourage regular exercise and weight loss efforts.  Caution against driving when sleepy and against medications with sedative side effects.  Sleep position optimization by encourage sleep in a lateral position, elevating the head of the bed bu about 30 degrees may help.

## 2019-04-19 ENCOUNTER — Telehealth: Payer: Self-pay | Admitting: General Practice

## 2019-04-19 NOTE — Telephone Encounter (Signed)
Patient called requesting to speak with [facility]. Patient states she is really depressed and need to speak with her. Please f/u

## 2019-04-21 ENCOUNTER — Telehealth: Payer: Self-pay | Admitting: Internal Medicine

## 2019-04-21 NOTE — Telephone Encounter (Signed)
1) Medication(s) Requested (by name): esomeprazole (NEXIUM) 40 MG capsule [id number]  QUEtiapine (SEROQUEL) 25 MG tablet [id number]   2) Pharmacy of Choice:  [id number] - [place], [address] [place] [address]    Approved medications will be sent to pharmacy, we will reach out to you if there is an issue.  Requests made after 3pm may not be addressed until following business day!

## 2019-04-22 ENCOUNTER — Other Ambulatory Visit: Payer: Self-pay | Admitting: Internal Medicine

## 2019-04-22 DIAGNOSIS — F331 Major depressive disorder, recurrent, moderate: Secondary | ICD-10-CM

## 2019-04-22 MED ORDER — QUETIAPINE FUMARATE 25 MG PO TABS: 25.0000 mg | ORAL_TABLET | Freq: Every day | ORAL | 3 refills | Status: DC

## 2019-04-22 MED ORDER — ESOMEPRAZOLE MAGNESIUM 40 MG PO CPDR: DELAYED_RELEASE_CAPSULE | ORAL | 3 refills | Status: DC

## 2019-04-22 MED ORDER — AMLODIPINE BESY-BENAZEPRIL HCL 10-40 MG PO CAPS: 1.0000 | ORAL_CAPSULE | Freq: Every day | ORAL | 11 refills | Status: DC

## 2019-04-27 ENCOUNTER — Telehealth: Payer: Self-pay | Admitting: Licensed Clinical Social Worker

## 2019-04-27 NOTE — Telephone Encounter (Signed)
Call placed to [facility], Referral Coordinator, with [facility]. Pt was contacted and successfully scheduled appointment with the [facility] office February 23, 21

## 2019-04-29 ENCOUNTER — Other Ambulatory Visit: Payer: Self-pay

## 2019-04-29 ENCOUNTER — Ambulatory Visit
Admission: EM | Admit: 2019-04-29 | Discharge: 2019-04-29 | Disposition: A | Discharge status: Home / Self Care | Payer: Medicare Other | Attending: Emergency Medicine | Admitting: Emergency Medicine

## 2019-04-29 DIAGNOSIS — R1010 Upper abdominal pain, unspecified: Secondary | ICD-10-CM | POA: Diagnosis not present

## 2019-04-29 DIAGNOSIS — Z8711 Personal history of peptic ulcer disease: Secondary | ICD-10-CM | POA: Diagnosis not present

## 2019-04-29 MED ORDER — SUCRALFATE 1 G PO TABS: 1.0000 g | ORAL_TABLET | Freq: Three times a day (TID) | ORAL | 0 refills | Status: DC

## 2019-04-29 MED ORDER — FAMOTIDINE 40 MG PO TABS: 40.0000 mg | ORAL_TABLET | Freq: Every day | ORAL | 0 refills | Status: DC | PRN

## 2019-04-29 MED ORDER — ESOMEPRAZOLE MAGNESIUM 40 MG PO CPDR: DELAYED_RELEASE_CAPSULE | ORAL | 0 refills | Status: DC

## 2019-04-29 NOTE — ED Provider Notes (Signed)
[facility]    CSN: [id number] Arrival date & time: 04/29/19  0800      History   Chief Complaint Chief Complaint  Patient presents with  . Abdominal Pain    HPI Natalie Edwards is a 54 y.o. female presenting for left upper quadrant pain for the last 2 to 3 weeks.  Patient endorsing history of gastric ulcers as evidenced on EGD 2 years ago: States this feels similar.  Patient denies heavy NSAID use, alcohol use.  Does smoke cigarettes as noted below.  Patient also has history of iron deficiency anemia, for which she recently resumed iron.  Notes having dark stools since starting iron.  No hematochezia, blood on toilet paper, vomiting, fever.  Patient denies fatigue, lightheadedness, dizziness, palpitations, chest pain, difficulty breathing.  No history of hospitalization for anemia, or blood transfusion.  Patient reports good appetite, activity level.  Denies history of hypertriglyceridemia, pancreatitis.  Patient is status post cholecystectomy.   Past Medical History:  Diagnosis Date  . Allergy   . Anemia   . Diverticulosis   . Gall bladder stones 1993  . Gastric ulcer   . GERD (gastroesophageal reflux disease)   . Hypertension   . Renal mass     Patient Active Problem List   Diagnosis Date Noted  . CKD (chronic kidney disease) 04/07/2019  . GAD (generalized anxiety disorder) 05/07/2018  . Chronic gastric ulcer 05/07/2018  . Essential hypertension 05/07/2018  . Iron deficiency anemia due to chronic blood loss 09/23/2016  . Peptic ulcer disease with hemorrhage 09/23/2016  . Thrombocytosis ([place]) 09/09/2016  . Leukocytosis 09/09/2016  . H/O ulcer disease 07/31/2016    Past Surgical History:  Procedure Laterality Date  . ABDOMINAL HYSTERECTOMY    . CHOLECYSTECTOMY      OB History   No obstetric history on file.      Home Medications    Prior to Admission medications   Medication Sig Start Date End Date Taking? Authorizing Provider    amLODipine-benazepril (LOTREL) 10-40 MG capsule Take 1 capsule by mouth daily. 04/22/19   Nicolette Bang, DO  busPIRone (BUSPAR) 10 MG tablet Take 1 tablet (10 mg total) by mouth 2 (two) times daily. 06/07/18   Scot Jun, FNP  esomeprazole (NEXIUM) 40 MG capsule TAKE 1 CAPSULE BY MOUTH 2 TIMES DAILY BEFORE A MEAL. 04/29/19   Hall-Potvin, [place], PA-C  famotidine (PEPCID) 40 MG tablet Take 1 tablet (40 mg total) by mouth daily as needed for heartburn or indigestion. 04/29/19 05/29/19  Hall-Potvin, [place], PA-C  fluticasone (FLONASE) 50 MCG/ACT nasal spray Place 2 sprays into both nostrils daily. 12/06/18   Fulp, Cammie, MD  hydrochlorothiazide (HYDRODIURIL) 25 MG tablet Take 25 mg by mouth daily. 04/01/19   [provider]  Iron, Ferrous Sulfate, 325 (65 Fe) MG TABS Take 325 mg by mouth daily. 03/08/19   Orson Slick, MD  loratadine (CLARITIN) 10 MG tablet Take 1 tablet (10 mg total) by mouth daily. 12/06/18   Fulp, Cammie, MD  QUEtiapine (SEROQUEL) 25 MG tablet Take 1 tablet (25 mg total) by mouth at bedtime. 04/22/19   Nicolette Bang, DO  sucralfate (CARAFATE) 1 g tablet Take 1 tablet (1 g total) by mouth 4 (four) times daily -  with meals and at bedtime for 14 days. 04/29/19 05/13/19  Hall-Potvin, [place], PA-C  temazepam (RESTORIL) 30 MG capsule Take 1 capsule (30 mg total) by mouth at bedtime as needed for sleep. 04/13/19   Olalere, Adewale  A, MD    Family History Family History  Problem Relation Age of Onset  . Cancer Maternal Uncle        Lung  . Cancer Maternal Uncle        Lung  . Cancer Maternal Uncle        Lung  . Headache Neg Hx        "I don't think so"  . Migraines Neg Hx        "I don't think so"    Social History Social History   Tobacco Use  . Smoking status: Current Every Day Smoker    Packs/day: 0.25    Years: 25.00    Pack years: 6.25    Types: Cigarettes  . Smokeless tobacco: Never Used  . Tobacco comment: Pt tried to  quit - helps with anxiety   Substance Use Topics  . Alcohol use: Not Currently  . Drug use: No     Allergies   Ambien [zolpidem tartrate] and Hydroxyzine   Review of Systems As per HPI   Physical Exam Triage Vital Signs ED Triage Vitals  Enc Vitals Group     BP      Pulse      Resp      Temp      Temp src      SpO2      Weight      Height      Head Circumference      Peak Flow      Pain Score      Pain Loc      Pain Edu?      Excl. in [place]?    No data found.  Updated Vital Signs BP 101/66 (BP Location: Left Arm)   Pulse 90   Temp 98.3 F (36.8 C) (Oral)   Resp 16   SpO2 100%   Visual Acuity Right Eye Distance:   Left Eye Distance:   Bilateral Distance:    Right Eye Near:   Left Eye Near:    Bilateral Near:     Physical Exam Constitutional:      General: She is not in acute distress.    Appearance: She is well-developed. She is not ill-appearing.  HENT:     Head: Normocephalic and atraumatic.  Eyes:     General: No scleral icterus.    Pupils: Pupils are equal, round, and reactive to light.  Cardiovascular:     Rate and Rhythm: Normal rate and regular rhythm.     Heart sounds: No murmur. No gallop.   Pulmonary:     Effort: Pulmonary effort is normal. No respiratory distress.     Breath sounds: No wheezing.  Abdominal:     General: Abdomen is flat. Bowel sounds are normal. There is no distension.     Palpations: Abdomen is soft. There is no hepatomegaly, splenomegaly or pulsatile mass.     Tenderness: There is abdominal tenderness in the epigastric area and left upper quadrant. There is no right CVA tenderness, left CVA tenderness, guarding or rebound. Negative signs include Murphy's sign, Rovsing's sign and McBurney's sign.     Hernia: No hernia is present.     Comments: Mild TTP  Skin:    General: Skin is warm.     Capillary Refill: Capillary refill takes less than 2 seconds.     Coloration: Skin is not cyanotic, jaundiced, mottled or pale.      Findings: No erythema or rash.  Neurological:  Mental Status: She is alert and oriented to person, place, and time.      UC Treatments / Results  Labs (all labs ordered are listed, but only abnormal results are displayed) Labs Reviewed - No data to display  EKG   Radiology No results found.  Procedures Procedures (including critical care time)  Medications Ordered in UC Medications - No data to display  Initial Impression / Assessment and Plan / UC Course  I have reviewed the triage vital signs and the nursing notes.  Pertinent labs & imaging results that were available during my care of the patient were reviewed by me and considered in my medical decision making (see chart for details).     Afebrile, nontoxic, hemodynamically stable in office today.  No acute distress second to pain and patient has history of gastric ulcers with H&P very similar to previous flare few years ago.  Patient does endorse melena, though also recently started iron tabs: Declines Hemoccult in office today.  Patient is without signs/symptoms of acute blood loss anemia does not want to undergo further diagnostics at this time: CBC deferred.  Per chart review, patient last saw GI February 2020: Had similar concern at the time and was given Nexium 40 mg twice daily, Pepcid twice daily as needed for breakthrough pain, Carafate x2 weeks.  States she tolerated this well.  Will have patient follow-up with GI doctor as she may need EGD for further evaluation.  ER return precautions discussed, patient verbalized understanding and is agreeable to plan.  Greater than 30 minutes were spent in reviewing patient's medical records, time at bedside, and coordination of care.   Final diagnoses:  History of peptic ulcer disease  Upper abdominal pain     Discharge Instructions     Review diet info. Take meds as discussed. Follow up with GI - call today to make appointment. Go to ER for worsening pain, blood in  vomit or stool, weakness, fatigue, palpitations.    ED Prescriptions    Medication Sig Dispense Auth. Provider   esomeprazole (NEXIUM) 40 MG capsule TAKE 1 CAPSULE BY MOUTH 2 TIMES DAILY BEFORE A MEAL. 60 capsule Hall-Potvin, [place], PA-C   sucralfate (CARAFATE) 1 g tablet Take 1 tablet (1 g total) by mouth 4 (four) times daily -  with meals and at bedtime for 14 days. 56 tablet Hall-Potvin, [place], PA-C   famotidine (PEPCID) 40 MG tablet Take 1 tablet (40 mg total) by mouth daily as needed for heartburn or indigestion. 30 tablet Hall-Potvin, [place], PA-C     PDMP not reviewed this encounter.   Hall-Potvin, [place], [place] 04/29/19 1027

## 2019-04-29 NOTE — Discharge Instructions (Addendum)
Review diet info. Take meds as discussed. Follow up with GI - call today to make appointment. Go to ER for worsening pain, blood in vomit or stool, weakness, fatigue, palpitations.

## 2019-04-29 NOTE — ED Triage Notes (Signed)
Patient presents to Urgent Care with complaints of LLQ abdominal pain since 2-3 weeks ago. Patient reports she has a gastric ulcer that she thinks may be bleeding, endorses dark stools. Pt takes nexium for her ulcers, has been taking regularly.

## 2019-05-03 ENCOUNTER — Other Ambulatory Visit: Payer: Self-pay

## 2019-05-03 ENCOUNTER — Other Ambulatory Visit (INDEPENDENT_AMBULATORY_CARE_PROVIDER_SITE_OTHER): Payer: Medicare Other

## 2019-05-03 ENCOUNTER — Ambulatory Visit (INDEPENDENT_AMBULATORY_CARE_PROVIDER_SITE_OTHER): Payer: Medicare Other | Admitting: Physician Assistant

## 2019-05-03 ENCOUNTER — Encounter: Payer: Self-pay | Admitting: Physician Assistant

## 2019-05-03 VITALS — BP 116/74 | HR 72 | Temp 97.9°F | Ht 63.0 in | Wt 180.0 lb

## 2019-05-03 DIAGNOSIS — Z01818 Encounter for other preprocedural examination: Secondary | ICD-10-CM

## 2019-05-03 DIAGNOSIS — R195 Other fecal abnormalities: Secondary | ICD-10-CM | POA: Diagnosis not present

## 2019-05-03 DIAGNOSIS — Z8719 Personal history of other diseases of the digestive system: Secondary | ICD-10-CM

## 2019-05-03 DIAGNOSIS — R110 Nausea: Secondary | ICD-10-CM

## 2019-05-03 DIAGNOSIS — Z8711 Personal history of peptic ulcer disease: Secondary | ICD-10-CM

## 2019-05-03 DIAGNOSIS — R1012 Left upper quadrant pain: Secondary | ICD-10-CM | POA: Diagnosis not present

## 2019-05-03 LAB — CBC WITH DIFFERENTIAL/PLATELET
Basophils Absolute: 0.1 10*3/uL (ref 0.0–0.1)
Basophils Relative: 0.7 % (ref 0.0–3.0)
Eosinophils Absolute: 0.2 10*3/uL (ref 0.0–0.7)
Eosinophils Relative: 1.2 % (ref 0.0–5.0)
HCT: 37.9 % (ref 36.0–46.0)
Hemoglobin: 12.3 g/dL (ref 12.0–15.0)
Lymphocytes Relative: 32.5 % (ref 12.0–46.0)
Lymphs Abs: 5.8 10*3/uL — ABNORMAL HIGH (ref 0.7–4.0)
MCHC: 32.4 g/dL (ref 30.0–36.0)
MCV: 88.1 fl (ref 78.0–100.0)
Monocytes Absolute: 1.3 10*3/uL — ABNORMAL HIGH (ref 0.1–1.0)
Monocytes Relative: 7.6 % (ref 3.0–12.0)
Neutro Abs: 10.3 10*3/uL — ABNORMAL HIGH (ref 1.4–7.7)
Neutrophils Relative %: 58 % (ref 43.0–77.0)
Platelets: 533 10*3/uL — ABNORMAL HIGH (ref 150.0–400.0)
RBC: 4.3 Mil/uL (ref 3.87–5.11)
RDW: 15.4 % (ref 11.5–15.5)
WBC: 17.8 10*3/uL — ABNORMAL HIGH (ref 4.0–10.5)

## 2019-05-03 LAB — COMPREHENSIVE METABOLIC PANEL
ALT: 14 U/L (ref 0–35)
AST: 12 U/L (ref 0–37)
Albumin: 4.7 g/dL (ref 3.5–5.2)
Alkaline Phosphatase: 73 U/L (ref 39–117)
BUN: 26 mg/dL — ABNORMAL HIGH (ref 6–23)
CO2: 26 mEq/L (ref 19–32)
Calcium: 10.1 mg/dL (ref 8.4–10.5)
Chloride: 107 mEq/L (ref 96–112)
Creatinine, Ser: 1.25 mg/dL — ABNORMAL HIGH (ref 0.40–1.20)
GFR: 53.93 mL/min — ABNORMAL LOW (ref >60.00)
Glucose, Bld: 101 mg/dL — ABNORMAL HIGH (ref 70–99)
Potassium: 4.8 mEq/L (ref 3.5–5.1)
Sodium: 138 mEq/L (ref 135–145)
Total Bilirubin: 0.2 mg/dL (ref 0.2–1.2)
Total Protein: 8.3 g/dL (ref 6.0–8.3)

## 2019-05-03 LAB — LIPASE: Lipase: 29 U/L (ref 11.0–59.0)

## 2019-05-03 MED ORDER — ESOMEPRAZOLE MAGNESIUM 40 MG PO CPDR: DELAYED_RELEASE_CAPSULE | ORAL | 4 refills | Status: DC

## 2019-05-03 NOTE — Progress Notes (Signed)
Subjective:    Patient ID: Natalie Edwards, female    DOB: 02-Mar-1966, 54 y.o.   MRN: [id number]  HPI Natalie Edwards is a pleasant 54 year old African-American female, established with Dr. Tarri Glenn and last seen in February 2020.  She is referred back today after recent ER visit on 04/29/2019 with acute abdominal pain. Patient has prior history of gastric ulcers which were documented on EGD in 2018 done by [facility] medical/Dr. Britta Mccreedy with finding of two 1 cm gastric ulcers.  I do not see any path, she was to have follow-up EGD in 3 months but that did not occur.  She also had colonoscopy in 2018 per Dr. Britta Mccreedy which was by notes normal. When she was seen a year ago by Dr. Modena Nunnery she was also complaining of epigastric pain.  At that time she was treated with twice daily PPI, Carafate and a short course of Pepcid.  Was to have EGD if no improvement. She says that she has stayed on Nexium over the past year once daily and has been completely avoiding all NSAIDs.  She does not use EtOH.  She started developing recurrent left upper quadrant and epigastric pain about a month ago which has progressed.  She says this comes and goes but will sometimes be present all day long.  She describes it as being cramping burning and aching in nature.  When she is having pain her appetite is decreased and she has nausea without vomiting.  She has noted some dark stools over the past couple of weeks but is also on an iron supplement. She had declined rectal exam in the ER and also declined labs that day.  She was told to increase Nexium to twice daily and given a course of Carafate 1 g 4 times daily.  Pepcid was also added 40 mg daily. Over the past few days she has not had any worsening of symptoms but is not sure that meds have made much difference as yet.  She says she is taking medications as directed.  Symptoms are reminiscent of prior ulcer symptoms. Last labs 03/08/2019 hemoglobin 12.3 hematocrit of 37 ferritin 42, serum iron 34  TIBC 388 iron saturation 9  Review of Systems Pertinent positive and negative review of systems were noted in the above HPI section.  All other review of systems was otherwise negative.  Outpatient Encounter Medications as of 05/03/2019  Medication Sig  . amLODipine-benazepril (LOTREL) 10-40 MG capsule Take 1 capsule by mouth daily.  . busPIRone (BUSPAR) 10 MG tablet Take 1 tablet (10 mg total) by mouth 2 (two) times daily.  [name] esomeprazole (NEXIUM) 40 MG capsule TAKE 1 CAPSULE BY MOUTH 2 TIMES DAILY BEFORE A MEAL.  . famotidine (PEPCID) 40 MG tablet Take 1 tablet (40 mg total) by mouth daily as needed for heartburn or indigestion.  . fluticasone (FLONASE) 50 MCG/ACT nasal spray Place 2 sprays into both nostrils daily.  . hydrochlorothiazide (HYDRODIURIL) 25 MG tablet Take 25 mg by mouth daily.  . Iron, Ferrous Sulfate, 325 (65 Fe) MG TABS Take 325 mg by mouth daily.  [name] loratadine (CLARITIN) 10 MG tablet Take 1 tablet (10 mg total) by mouth daily.  . QUEtiapine (SEROQUEL) 25 MG tablet Take 1 tablet (25 mg total) by mouth at bedtime.  . sucralfate (CARAFATE) 1 g tablet Take 1 tablet (1 g total) by mouth 4 (four) times daily -  with meals and at bedtime for 14 days.  . temazepam (RESTORIL) 30 MG capsule Take 1 capsule (30  mg total) by mouth at bedtime as needed for sleep.  . [DISCONTINUED] esomeprazole (NEXIUM) 40 MG capsule TAKE 1 CAPSULE BY MOUTH 2 TIMES DAILY BEFORE A MEAL.   No facility-administered encounter medications on file as of 05/03/2019.   Allergies  Allergen Reactions  . Ambien [Zolpidem Tartrate]     Jitteriness, nervousness, abdominal pain  . Hydroxyzine     Palpitations and jitteriness    Patient Active Problem List   Diagnosis Date Noted  . CKD (chronic kidney disease) 04/07/2019  . GAD (generalized anxiety disorder) 05/07/2018  . Chronic gastric ulcer 05/07/2018  . Essential hypertension 05/07/2018  . Iron deficiency anemia due to chronic blood loss 09/23/2016  .  Peptic ulcer disease with hemorrhage 09/23/2016  . Thrombocytosis ([place]) 09/09/2016  . Leukocytosis 09/09/2016  . H/O ulcer disease 07/31/2016   Social History   Socioeconomic History  . Marital status: Single    Spouse name: Not on file  . Number of children: 3  . Years of education: Not on file  . Highest education level: High school graduate  Occupational History  . Not on file  Tobacco Use  . Smoking status: Current Every Day Smoker    Packs/day: 0.25    Years: 25.00    Pack years: 6.25    Types: Cigarettes  . Smokeless tobacco: Never Used  . Tobacco comment: Pt tried to quit - helps with anxiety   Substance and Sexual Activity  . Alcohol use: Not Currently  . Drug use: No  . Sexual activity: Yes    Comment: hysterectomy  Other Topics Concern  . Not on file  Social History Narrative   Lives at home in an apartment. Her mother lives with her.    Right handed   Caffeine: drinks approx. 36 oz of pepsi per day. Sometimes drinks 2 cups of coffee in a day as well.    Social Determinants of Health   Financial Resource Strain:   . Difficulty of Paying Living Expenses: Not on file  Food Insecurity:   . Worried About Charity fundraiser in the Last Year: Not on file  . Ran Out of Food in the Last Year: Not on file  Transportation Needs:   . Lack of Transportation (Medical): Not on file  . Lack of Transportation (Non-Medical): Not on file  Physical Activity:   . Days of Exercise per Week: Not on file  . Minutes of Exercise per Session: Not on file  Stress:   . Feeling of Stress : Not on file  Social Connections:   . Frequency of Communication with Friends and Family: Not on file  . Frequency of Social Gatherings with Friends and Family: Not on file  . Attends Religious Services: Not on file  . Active Member of Clubs or Organizations: Not on file  . Attends Archivist Meetings: Not on file  . Marital Status: Not on file  Intimate Partner Violence:   . Fear  of Current or Ex-Partner: Not on file  . Emotionally Abused: Not on file  . Physically Abused: Not on file  . Sexually Abused: Not on file    Ms. Natalie Edwards's family history includes Cancer in her maternal uncle, maternal uncle, and maternal uncle.      Objective:    Vitals:   05/03/19 1339  BP: 116/74  Pulse: 72  Temp: 97.9 F (36.6 C)    Physical Exam Well-developed well-nourished AA female in no acute distress.  Height, Weight, 180 BMI 31.8  HEENT; nontraumatic normocephalic, EOMI, PE RR LA, sclera anicteric. Oropharynx; not examined Neck; supple, no JVD Cardiovascular; regular rate and rhythm with S1-S2, no murmur rub or gallop Pulmonary; Clear bilaterally Abdomen; soft, she is tender in the left upper quadrant and epigastrium nondistended, no palpable mass or hepatosplenomegaly, bowel sounds are active Rectal; not done today-patient specifically declined Skin; benign exam, no jaundice rash or appreciable lesions Extremities; no clubbing cyanosis or edema skin warm and dry Neuro/Psych; alert and oriented x4, grossly nonfocal mood and affect appropriate       Assessment & Plan:   #42 54 year old African-American female with prior history of gastric ulcers diagnosed by EGD 2018, with recurrent epigastric and left upper quadrant pain over the past month, somewhat progressive, to the point of ER visit on 04/29/2019.  Recent dark stools, rule out low-grade GI blood loss  Suspect recurrent peptic ulcer disease, chronic gastropathy.  #2 history of iron deficiency-persistent, mild without anemia as of December 2020 #3 status post cholecystectomy #4.  Colon cancer screening-negative colonoscopy 2018-/[facility] #5 generalized anxiety #6.  Hypertension  Plan; patient will be scheduled for upper endoscopy with Dr. Tarri Glenn.  Procedure discussed in detail with the patient including indications risks and benefits and she is agreeable to proceed. CBC, c-Met and lipase today For  now continue Nexium 40 mg p.o. twice daily Complete a 2-week course of Carafate 1 g 4 times daily between meals and at bedtime Continue Pepcid 40 mg once daily Further recommendations pending findings at EGD-if EGD unremarkable will need cross-sectional imaging.  Avey Mcmanamon S Imanie Darrow PA-C 05/03/2019   Cc: No ref. provider found

## 2019-05-03 NOTE — Patient Instructions (Signed)
If you are age 55 or older, your body mass index should be between 23-30. Your Body mass index is 31.89 kg/m. If this is out of the aforementioned range listed, please consider follow up with your Primary Care Provider.  If you are age 40 or younger, your body mass index should be between 19-25. Your Body mass index is 31.89 kg/m. If this is out of the aformentioned range listed, please consider follow up with your Primary Care Provider.   You have been scheduled for an endoscopy. Please follow written instructions given to you at your visit today. If you use inhalers (even only as needed), please bring them with you on the day of your procedure. Your physician has requested that you go to www.startemmi.com and enter the access code given to you at your visit today. This web site gives a general overview about your procedure. However, you should still follow specific instructions given to you by our office regarding your preparation for the procedure.  Your provider has requested that you go to the basement level for lab work before leaving today. Press "B" on the elevator. The lab is located at the first door on the left as you exit the elevator.  We have sent the following medications to your pharmacy for you to pick up at your convenience: Nexium - take twice daily for 2 months, then once daily thereafter.  Continue Carafate 1 gm four times daily for two weeks.  Tylenol is okay - DO NOT TAKE Aspirin, Advil, Ibuprofen etc.  Thank you for choosing me and [facility].   Amy Esterwood, PA-C  Due to recent changes in healthcare laws, you may see the results of your imaging and laboratory studies on MyChart before your provider has had a chance to review them.  We understand that in some cases there may be results that are confusing or concerning to you. Not all laboratory results come back in the same time frame and the provider may be waiting for multiple results in order to  interpret others.  Please give [place] 48 hours in order for your provider to thoroughly review all the results before contacting the office for clarification of your results.

## 2019-05-04 ENCOUNTER — Encounter: Payer: Self-pay | Admitting: Pulmonary Disease

## 2019-05-04 ENCOUNTER — Other Ambulatory Visit: Payer: Self-pay

## 2019-05-04 ENCOUNTER — Ambulatory Visit (INDEPENDENT_AMBULATORY_CARE_PROVIDER_SITE_OTHER): Payer: Medicare Other | Admitting: Pulmonary Disease

## 2019-05-04 VITALS — BP 118/78 | HR 90 | Temp 98.0°F | Ht 63.0 in | Wt 180.0 lb

## 2019-05-04 DIAGNOSIS — G4700 Insomnia, unspecified: Secondary | ICD-10-CM | POA: Diagnosis not present

## 2019-05-04 DIAGNOSIS — F411 Generalized anxiety disorder: Secondary | ICD-10-CM

## 2019-05-04 MED ORDER — NA SULFATE-K SULFATE-MG SULF 17.5-3.13-1.6 GM/177ML PO SOLN: 1.0000 | ORAL | 0 refills | Status: AC

## 2019-05-04 MED ORDER — BELSOMRA 10 MG PO TABS: 10.0000 mg | ORAL_TABLET | Freq: Every day | ORAL | 1 refills | Status: DC

## 2019-05-04 NOTE — Progress Notes (Signed)
Subjective:    Patient ID: Natalie Edwards, female    DOB: [birth year], 54 y.o.   MRN: [id number]  Patient with a history of chronic insomnia  Evaluated in the past about 5 years ago  She was on restoril in the past that seem to help symptoms 30 mg of Restoril does help at times but sometimes not seeing sustained significant improvement in her sleep  Usually tries to go to bed between 8 and 9 PM Takes hours to fall asleep Wakes up routinely over 3 times during the night Final awakening time about 6 AM  She does have a history of snoring, no witnessed apneas She is only sleepy during the day when she does not get adequate rest at night Occasional dryness of her mouth in the mornings She does have headaches  No family history of sleep disordered breathing  She is an active smoker, a pack of cigarettes lasts her about 3 days She is trying to quit but not able to at present  Has a lot of stressors  Past Medical History:  Diagnosis Date  . Allergy   . Anemia   . Diverticulosis   . Gall bladder stones 1993  . Gastric ulcer   . GERD (gastroesophageal reflux disease)   . Hypertension   . Renal mass    Social History   Socioeconomic History  . Marital status: Single    Spouse name: Not on file  . Number of children: 3  . Years of education: Not on file  . Highest education level: High school graduate  Occupational History  . Not on file  Tobacco Use  . Smoking status: Current Every Day Smoker    Packs/day: 0.25    Years: 25.00    Pack years: 6.25    Types: Cigarettes  . Smokeless tobacco: Never Used  . Tobacco comment: Pt tried to quit - helps with anxiety   Substance and Sexual Activity  . Alcohol use: Not Currently  . Drug use: No  . Sexual activity: Yes    Comment: hysterectomy  Other Topics Concern  . Not on file  Social History Narrative   Lives at home in an apartment. Her mother lives with her.    Right handed   Caffeine: drinks approx. 36 oz of pepsi per  day. Sometimes drinks 2 cups of coffee in a day as well.    Social Determinants of Health   Financial Resource Strain:   . Difficulty of Paying Living Expenses: Not on file  Food Insecurity:   . Worried About Charity fundraiser in the Last Year: Not on file  . Ran Out of Food in the Last Year: Not on file  Transportation Needs:   . Lack of Transportation (Medical): Not on file  . Lack of Transportation (Non-Medical): Not on file  Physical Activity:   . Days of Exercise per Week: Not on file  . Minutes of Exercise per Session: Not on file  Stress:   . Feeling of Stress : Not on file  Social Connections:   . Frequency of Communication with Friends and Family: Not on file  . Frequency of Social Gatherings with Friends and Family: Not on file  . Attends Religious Services: Not on file  . Active Member of Clubs or Organizations: Not on file  . Attends Archivist Meetings: Not on file  . Marital Status: Not on file  Intimate Partner Violence:   . Fear of Current or Ex-Partner: Not on  file  . Emotionally Abused: Not on file  . Physically Abused: Not on file  . Sexually Abused: Not on file   Family History  Problem Relation Age of Onset  . Cancer Maternal Uncle        Lung  . Cancer Maternal Uncle        Lung  . Cancer Maternal Uncle        Lung  . Headache Neg Hx        "I don't think so"  . Migraines Neg Hx        "I don't think so"   Review of Systems  Genitourinary: Negative for dysuria.  Skin: Negative for rash.  Allergic/Immunologic: Negative.  Negative for environmental allergies, food allergies and immunocompromised state.  Neurological: Positive for headaches.  Hematological: Does not bruise/bleed easily.  Psychiatric/Behavioral: Negative for dysphoric mood. The patient is nervous/anxious.   All other systems reviewed and are negative.     Objective:   Physical Exam Constitutional:      Appearance: Normal appearance.  HENT:     Head:  Normocephalic.     Nose: Nose normal.     Mouth/Throat:     Mouth: Mucous membranes are moist.     Comments: Mallampati 1, Eyes:     General:        Right eye: No discharge.        Left eye: No discharge.     Pupils: Pupils are equal, round, and reactive to light.  Cardiovascular:     Rate and Rhythm: Normal rate and regular rhythm.     Pulses: Normal pulses.     Heart sounds: No murmur. No friction rub.  Pulmonary:     Effort: Pulmonary effort is normal. No respiratory distress.     Breath sounds: No stridor. No wheezing or rhonchi.  Musculoskeletal:     Cervical back: Normal range of motion. No rigidity.  Neurological:     Mental Status: She is alert.    Vitals:   05/04/19 1531  BP: 118/78  Pulse: 90  Temp: 98 F (36.7 C)  SpO2: 98%   Results of the Epworth flowsheet 01/06/2019  Sitting and reading 0  Watching TV 0  Sitting, inactive in a public place (e.g. a theatre or a meeting) 0  As a passenger in a car for an hour without a break 0  Lying down to rest in the afternoon when circumstances permit 0  Sitting and talking to someone 0  Sitting quietly after a lunch without alcohol 0  In a car, while stopped for a few minutes in traffic 0  Total score 0      Assessment & Plan:  Sleep onset and sleep maintenance insomnia -Restoril 30 mg did help -Nursing sustained improvement in sleep quality -We will add Belsomra 10 mg -She is not able to get an adequate amount of rest without a sleep aid  History of depression and anxiety -She feels she is better but not taking any medications currently -Was on Seroquel at some point  Nicotine addiction -Working on quitting smoking -Counseled on the need to quit smoking -Counseled on how nicotine may be contributing to insomnia  Plan: Continue Restoril Add Belsomra 10 mg  Significance of quitting smoking was discussed with patient, being a stimulant it may be contributing to her insomnia  Importance of stimulus  control discussed  Sleep disordered breathing also discussed as possibly a contributor to sleep maintenance insomnia  I will see her back  in the office in about 3 months  Encouraged to call with any significant concerns  Smoking cessation counseling

## 2019-05-04 NOTE — Patient Instructions (Signed)
Insomnia  We will add Belsomra 10 mg nightly  We will see you back in 3 months  Call with any concerns

## 2019-05-04 NOTE — Addendum Note (Signed)
Addended by: Wyline Beady on: 05/04/2019 04:58 PM   Modules accepted: Orders

## 2019-05-06 NOTE — Progress Notes (Signed)
Reviewed and agree with management plans. ? ?Josef Tourigny L. Anessa Charley, MD, MPH  ?

## 2019-05-09 ENCOUNTER — Encounter: Payer: Medicare Other | Admitting: Gastroenterology

## 2019-05-10 ENCOUNTER — Ambulatory Visit (HOSPITAL_COMMUNITY): Payer: Medicaid Other | Admitting: Licensed Clinical Social Worker

## 2019-05-12 ENCOUNTER — Encounter: Payer: Self-pay | Admitting: Internal Medicine

## 2019-05-12 ENCOUNTER — Ambulatory Visit (INDEPENDENT_AMBULATORY_CARE_PROVIDER_SITE_OTHER): Payer: Medicare Other | Admitting: Internal Medicine

## 2019-05-12 ENCOUNTER — Ambulatory Visit (INDEPENDENT_AMBULATORY_CARE_PROVIDER_SITE_OTHER): Payer: Medicare Other

## 2019-05-12 ENCOUNTER — Other Ambulatory Visit: Payer: Self-pay | Admitting: Gastroenterology

## 2019-05-12 DIAGNOSIS — F329 Major depressive disorder, single episode, unspecified: Secondary | ICD-10-CM

## 2019-05-12 DIAGNOSIS — Z1159 Encounter for screening for other viral diseases: Secondary | ICD-10-CM

## 2019-05-12 DIAGNOSIS — F32A Depression, unspecified: Secondary | ICD-10-CM

## 2019-05-12 DIAGNOSIS — F411 Generalized anxiety disorder: Secondary | ICD-10-CM

## 2019-05-12 NOTE — Progress Notes (Signed)
Virtual Visit via Telephone Note  I connected with Natalie Edwards, on 05/12/2019 at 3:49 PM by telephone due to the COVID-19 pandemic and verified that I am speaking with the correct person using two identifiers.   Consent: I discussed the limitations, risks, security and privacy concerns of performing an evaluation and management service by telephone and the availability of in person appointments. I also discussed with the patient that there may be a patient responsible charge related to this service. The patient expressed understanding and agreed to proceed.   Location of Patient: Home   Location of Provider: Home    Persons participating in Telemedicine visit: Natalie Edwards [facility] Dr. Juleen China      History of Present Illness: Patient has a follow up for depression and anxiety.   Had an appointment on 2/23 but cancelled because didn't feel like talking. Plans to reschedule but has not done so yet. She is taking Seroquel 25 mg nightly.   When asked what would be helpful, she says she thinks she might benefit from talking to someone but just wasn't in that mood. Used to be on depression medications but they made her gain weight. She is trying to avoid taking anything else. Notes that she is going through a lot taking care of her mom and dealing with her own medical problems.   Feels like she does have mood swings. She denies any suicidal thoughts. But she does say that she knows to call [place], LCSW if she were to ever feel that way. Denies safety concerns.   GAD 7 : Generalized Anxiety Score 05/12/2019 04/07/2019 09/09/2018 06/07/2018  Nervous, Anxious, on Edge '3 3 2 3  ' Control/stop worrying '3 3 2 3  ' Worry too much - different things '3 3 2 3  ' Trouble relaxing '3 3 2 3  ' Restless '1 3 2 3  ' Easily annoyed or irritable '3 3 2 3  ' Afraid - awful might happen '3 3 2 3  ' Total GAD 7 Score '19 21 14 21   ' Depression screen [facility] 2/9 05/12/2019 04/07/2019 09/09/2018  Decreased Interest '3 2 2   ' Down, Depressed, Hopeless '3 3 2  ' PHQ - 2 Score '6 5 4  ' Altered sleeping '2 3 1  ' Tired, decreased energy '2 3 1  ' Change in appetite '2 1 2  ' Feeling bad or failure about yourself  '2 3 2  ' Trouble concentrating '2 3 1  ' Moving slowly or fidgety/restless 1 1 0  Suicidal thoughts '1 3 1  ' PHQ-9 Score '18 22 12  ' Difficult doing work/chores Very difficult Extremely dIfficult -      Past Medical History:  Diagnosis Date  . Allergy   . Anemia   . Diverticulosis   . Gall bladder stones 1993  . Gastric ulcer   . GERD (gastroesophageal reflux disease)   . Hypertension   . Renal mass    Allergies  Allergen Reactions  . Ambien [Zolpidem Tartrate]     Jitteriness, nervousness, abdominal pain  . Hydroxyzine     Palpitations and jitteriness     Current Outpatient Medications on File Prior to Visit  Medication Sig Dispense Refill  . amLODipine-benazepril (LOTREL) 10-40 MG capsule Take 1 capsule by mouth daily. 30 capsule 11  . busPIRone (BUSPAR) 10 MG tablet Take 1 tablet (10 mg total) by mouth 2 (two) times daily. 60 tablet 1  . esomeprazole (NEXIUM) 40 MG capsule TAKE 1 CAPSULE BY MOUTH 2 TIMES DAILY BEFORE A MEAL. 60 capsule 4  . famotidine (  PEPCID) 40 MG tablet Take 1 tablet (40 mg total) by mouth daily as needed for heartburn or indigestion. 30 tablet 0  . fluticasone (FLONASE) 50 MCG/ACT nasal spray Place 2 sprays into both nostrils daily. 16 g 6  . hydrochlorothiazide (HYDRODIURIL) 25 MG tablet Take 25 mg by mouth daily.    . Iron, Ferrous Sulfate, 325 (65 Fe) MG TABS Take 325 mg by mouth daily. 90 tablet 1  . loratadine (CLARITIN) 10 MG tablet Take 1 tablet (10 mg total) by mouth daily. 30 tablet 11  . Na Sulfate-K Sulfate-Mg Sulf 17.5-3.13-1.6 GM/177ML SOLN Take 1 kit by mouth as directed. 354 mL 0  . QUEtiapine (SEROQUEL) 25 MG tablet Take 1 tablet (25 mg total) by mouth at bedtime. 30 tablet 3  . sucralfate (CARAFATE) 1 g tablet Take 1 tablet (1 g total) by mouth 4 (four) times daily -   with meals and at bedtime for 14 days. 56 tablet 0  . Suvorexant (BELSOMRA) 10 MG TABS Take 10 mg by mouth at bedtime. 30 tablet 1  . temazepam (RESTORIL) 30 MG capsule Take 1 capsule (30 mg total) by mouth at bedtime as needed for sleep. 30 capsule 3   No current facility-administered medications on file prior to visit.    Observations/Objective: NAD. Speaking clearly.  Work of breathing normal.  Alert and oriented. Mood appropriate.   Assessment and Plan: 1. GAD (generalized anxiety disorder) 2. Depression, unspecified depression type Patient with elevated GAD-7 and PHQ-9 score; however, she does not want to add any medications to treat. She had visit with therapist but cancelled. She does plan to reschedule. Ensured patient is not a safety threat to herself and she has plan for what to do if she were to become suicidal to keep herself safe. Provided support during this visit.    Follow Up Instructions: Follow up with therapist    I discussed the assessment and treatment plan with the patient. The patient was provided an opportunity to ask questions and all were answered. The patient agreed with the plan and demonstrated an understanding of the instructions.   The patient was advised to call back or seek an in-person evaluation if the symptoms worsen or if the condition fails to improve as anticipated.     I provided 12 minutes total of non-face-to-face time during this encounter including median intraservice time, reviewing previous notes, investigations, ordering medications, medical decision making, coordinating care and patient verbalized understanding at the end of the visit.    Phill Myron, D.O. Primary Care at [facility]  05/12/2019, 3:49 PM

## 2019-05-13 LAB — SARS CORONAVIRUS 2 (TAT 6-24 HRS): SARS Coronavirus 2: NEGATIVE

## 2019-05-16 ENCOUNTER — Encounter: Payer: Medicare Other | Admitting: Gastroenterology

## 2019-05-18 ENCOUNTER — Other Ambulatory Visit: Payer: Self-pay | Admitting: Pulmonary Disease

## 2019-05-19 ENCOUNTER — Other Ambulatory Visit: Payer: Self-pay | Admitting: Pulmonary Disease

## 2019-05-19 NOTE — Telephone Encounter (Signed)
[facility] Pharmacist stated Patient has no refills left on 04/13/19 prescription.  Pharmacist stated it is to early to refill, at this time, but they can hold prescription until ready for Patient.

## 2019-05-19 NOTE — Telephone Encounter (Signed)
Called and spoke with Patient. Patient stated she went to refill her temazepam 30mg , and was told she had no refills, and office had sent [facility]. Patient LOV  05/04/19, with Dr. Ander Slade. Temazepam 30mg , #30, with 3 refills last sent to pharmacy 04/13/19. [facility] [address]  Message routed to [place]

## 2019-05-19 NOTE — Telephone Encounter (Signed)
Lattie Haw, are we saying that the prescription was sent to pharmacy already on 04/13/2019 and was not filled?  Can we call the pharmacy to make sure that the prescription was not filled   I do not mind sending another one if the previous one was not filled

## 2019-05-20 ENCOUNTER — Other Ambulatory Visit: Payer: Self-pay | Admitting: Pulmonary Disease

## 2019-05-20 ENCOUNTER — Telehealth: Payer: Self-pay | Admitting: Pulmonary Disease

## 2019-05-20 MED ORDER — TEMAZEPAM 30 MG PO CAPS: 30.0000 mg | ORAL_CAPSULE | Freq: Every evening | ORAL | 3 refills | Status: DC | PRN

## 2019-05-20 NOTE — Progress Notes (Signed)
Temazepam prescription sent in

## 2019-05-20 NOTE — Telephone Encounter (Signed)
Are we calling this in for her she needs to know [phone]

## 2019-05-20 NOTE — Telephone Encounter (Signed)
Called and spoke with pt. Pt stated she had just received a call from the pharmacy stating that her med was ready for pickup. Nothing further needed.

## 2019-05-20 NOTE — Telephone Encounter (Signed)
Dr. Ander Slade,  Please sign the medication that is currently pending. I called the pharmacy and spoke with them and they did not receive the RX that was sent in on 1/27. So I have done another one and it is pending for you to sign since its a controlled substance.

## 2019-06-05 ENCOUNTER — Other Ambulatory Visit: Payer: Self-pay | Admitting: Hematology and Oncology

## 2019-06-05 DIAGNOSIS — D500 Iron deficiency anemia secondary to blood loss (chronic): Secondary | ICD-10-CM

## 2019-06-06 ENCOUNTER — Inpatient Hospital Stay: Payer: Medicare Other | Attending: Hematology and Oncology | Admitting: Hematology and Oncology

## 2019-06-06 ENCOUNTER — Inpatient Hospital Stay: Payer: Medicare Other

## 2019-06-16 ENCOUNTER — Encounter: Payer: Self-pay | Admitting: Gastroenterology

## 2019-07-04 ENCOUNTER — Telehealth: Payer: Self-pay | Admitting: Pulmonary Disease

## 2019-07-04 NOTE — Telephone Encounter (Signed)
Spoke with pt, she states she needs her Rx (temazepam) needed early for next month around 07/18/2019 because she is going out of the country. The pharmacist told her that she would need permission from AO to get the Rx early. AO please advise.

## 2019-07-07 NOTE — Telephone Encounter (Signed)
Pt called back about this. Has questions about what the process will look like when she goes to pick up meds.   [phone] can call anytime today

## 2019-07-07 NOTE — Telephone Encounter (Signed)
I called and spoke with the pharmacist and gave him a verbal ok to fill patient's Temazepam on 07/18/19 due to her going on out town. He verbalized that he would make a note in her account. I have made the patient aware of this as well. She states that she will make a follow up with  Dr. Jenetta Downer once she returns and does her 14 day quarantine.

## 2019-07-07 NOTE — Telephone Encounter (Signed)
Left message for patient to call back  

## 2019-07-07 NOTE — Telephone Encounter (Signed)
Okay to refill early

## 2019-07-11 ENCOUNTER — Encounter: Payer: Medicare Other | Admitting: Gastroenterology

## 2019-07-13 ENCOUNTER — Telehealth: Payer: Self-pay | Admitting: Internal Medicine

## 2019-07-13 ENCOUNTER — Other Ambulatory Visit: Payer: Self-pay | Admitting: Internal Medicine

## 2019-07-13 DIAGNOSIS — F331 Major depressive disorder, recurrent, moderate: Secondary | ICD-10-CM

## 2019-07-13 MED ORDER — QUETIAPINE FUMARATE 25 MG PO TABS: 25.0000 mg | ORAL_TABLET | Freq: Every day | ORAL | 3 refills | Status: DC

## 2019-07-13 NOTE — Telephone Encounter (Signed)
Called pt.

## 2019-07-13 NOTE — Telephone Encounter (Signed)
Sent in Rx.  Phill Myron, D.O. Primary Care at [facility]  07/13/2019, 1:23 PM

## 2019-07-13 NOTE — Telephone Encounter (Signed)
Pt is going out of the country and needs a early fill on QUEtiapine (SEROQUEL) 25 MG tablet

## 2019-07-15 ENCOUNTER — Other Ambulatory Visit: Payer: Self-pay | Admitting: Family Medicine

## 2019-07-15 ENCOUNTER — Telehealth: Payer: Self-pay | Admitting: Pulmonary Disease

## 2019-07-15 NOTE — Telephone Encounter (Signed)
Spoke with patient. She stated [facility] [address] to get her [facility]. [facility] and moved the fill date from May 3th to May 9th.   [facility] and spoke with the pharmacist. She stated that no one had called them to let them know that AO had approved the early refill. I advised her that on 07/07/19, AO stated that it would be ok for her to get the medication on May 3rd. Provided her with my name and number. They will have the refill on 07/18/19.   Spoke with patient again. She was made aware of the conversation I had with [facility]. She is ok with picking up her meds on May 3rd.   Nothing further needed at time of call.

## 2019-08-18 ENCOUNTER — Other Ambulatory Visit: Payer: Self-pay

## 2019-08-18 DIAGNOSIS — J309 Allergic rhinitis, unspecified: Secondary | ICD-10-CM

## 2019-08-18 MED ORDER — LORATADINE 10 MG PO TABS: 10.0000 mg | ORAL_TABLET | Freq: Every day | ORAL | 1 refills | Status: DC

## 2019-09-12 ENCOUNTER — Telehealth: Payer: Self-pay | Admitting: Pulmonary Disease

## 2019-09-12 NOTE — Telephone Encounter (Signed)
Called and spoke with pt letting her know that we have sent the refill request of her temazepam to AO and stated to her once we know that this has been refilled by him that we would call her to let her know. Pt verbalized understanding.

## 2019-09-12 NOTE — Telephone Encounter (Signed)
Patient requesting refill on Temazepam 30mg  at bedtime, last filled 05/20/2019. Please refill/advise.

## 2019-09-12 NOTE — Telephone Encounter (Signed)
Pt called back about this.

## 2019-09-13 ENCOUNTER — Other Ambulatory Visit: Payer: Self-pay | Admitting: Pulmonary Disease

## 2019-09-13 MED ORDER — TEMAZEPAM 30 MG PO CAPS: 30.0000 mg | ORAL_CAPSULE | Freq: Every evening | ORAL | 3 refills | Status: DC | PRN

## 2019-09-13 NOTE — Telephone Encounter (Signed)
LMTCB x1 for pt.  

## 2019-09-13 NOTE — Telephone Encounter (Signed)
I spoke with the pt and made her aware that her refill was sent. Nothing further needed.

## 2019-09-13 NOTE — Telephone Encounter (Signed)
Pt returning call.  [phone].  Gets off work at [employer].

## 2019-09-13 NOTE — Telephone Encounter (Signed)
Restoril refill sent in

## 2019-10-03 ENCOUNTER — Ambulatory Visit: Payer: Medicare Other | Admitting: Pulmonary Disease

## 2019-10-04 ENCOUNTER — Telehealth: Payer: Self-pay | Admitting: Internal Medicine

## 2019-10-04 NOTE — Telephone Encounter (Signed)
Called patient and LVM saying she needs to contact her GI doctor to request a refill.

## 2019-10-04 NOTE — Telephone Encounter (Signed)
1) Medication(s) Requested (by name): esomeprazole (NEXIUM) 40 MG capsule   2) Pharmacy of Choice: [facility] on [address].   3) Special Requests:   Approved medications will be sent to the pharmacy, we will reach out if there is an issue.  Requests made after 3pm may not be addressed until the following business day!  If a patient is unsure of the name of the medication(s) please note and ask patient to call back when they are able to provide all info, do not send to responsible party until all information is available!

## 2019-10-04 NOTE — Telephone Encounter (Signed)
Dr. Juleen China does not write this medication. She needs to call her GI doctor([facility]) for refill.

## 2019-10-14 ENCOUNTER — Ambulatory Visit: Payer: Medicare Other | Admitting: Internal Medicine

## 2019-10-18 ENCOUNTER — Encounter: Payer: Self-pay | Admitting: Primary Care

## 2019-10-18 ENCOUNTER — Ambulatory Visit (INDEPENDENT_AMBULATORY_CARE_PROVIDER_SITE_OTHER): Payer: Medicare Other | Admitting: Primary Care

## 2019-10-18 ENCOUNTER — Other Ambulatory Visit: Payer: Self-pay

## 2019-10-18 DIAGNOSIS — F5104 Psychophysiologic insomnia: Secondary | ICD-10-CM | POA: Diagnosis not present

## 2019-10-18 MED ORDER — BELSOMRA 10 MG PO TABS: 10.0000 mg | ORAL_TABLET | Freq: Every day | ORAL | 3 refills | Status: DC

## 2019-10-18 MED ORDER — TEMAZEPAM 30 MG PO CAPS: 30.0000 mg | ORAL_CAPSULE | Freq: Every evening | ORAL | 3 refills | Status: DC | PRN

## 2019-10-18 NOTE — Patient Instructions (Addendum)
Pleasure meeting you today Natalie Edwards  Recommendations: - Continue Restoril 30mg  at bedtime - Continue Belsomra 10mg  at bedtime - Aim for 6-7 hours of sleep each night   Follow-up: - 6 months with Dr. Ander Slade     Insomnia Insomnia is a sleep disorder that makes it difficult to fall asleep or stay asleep. Insomnia can cause fatigue, low energy, difficulty concentrating, mood swings, and poor performance at work or school. There are three different ways to classify insomnia:  Difficulty falling asleep.  Difficulty staying asleep.  Waking up too early in the morning. Any type of insomnia can be long-term (chronic) or short-term (acute). Both are common. Short-term insomnia usually lasts for three months or less. Chronic insomnia occurs at least three times a week for longer than three months. What are the causes? Insomnia may be caused by another condition, situation, or substance, such as:  Anxiety.  Certain medicines.  Gastroesophageal reflux disease (GERD) or other gastrointestinal conditions.  Asthma or other breathing conditions.  Restless legs syndrome, sleep apnea, or other sleep disorders.  Chronic pain.  Menopause.  Stroke.  Abuse of alcohol, tobacco, or illegal drugs.  Mental health conditions, such as depression.  Caffeine.  Neurological disorders, such as Alzheimer's disease.  An overactive thyroid (hyperthyroidism). Sometimes, the cause of insomnia may not be known. What increases the risk? Risk factors for insomnia include:  Gender. Women are affected more often than men.  Age. Insomnia is more common as you get older.  Stress.  Lack of exercise.  Irregular work schedule or working night shifts.  Traveling between different time zones.  Certain medical and mental health conditions. What are the signs or symptoms? If you have insomnia, the main symptom is having trouble falling asleep or having trouble staying asleep. This may lead to other  symptoms, such as:  Feeling fatigued or having low energy.  Feeling nervous about going to sleep.  Not feeling rested in the morning.  Having trouble concentrating.  Feeling irritable, anxious, or depressed. How is this diagnosed? This condition may be diagnosed based on:  Your symptoms and medical history. Your health care provider may ask about: ? Your sleep habits. ? Any medical conditions you have. ? Your mental health.  A physical exam. How is this treated? Treatment for insomnia depends on the cause. Treatment may focus on treating an underlying condition that is causing insomnia. Treatment may also include:  Medicines to help you sleep.  Counseling or therapy.  Lifestyle adjustments to help you sleep better. Follow these instructions at home: Eating and drinking   Limit or avoid alcohol, caffeinated beverages, and cigarettes, especially close to bedtime. These can disrupt your sleep.  Do not eat a large meal or eat spicy foods right before bedtime. This can lead to digestive discomfort that can make it hard for you to sleep. Sleep habits   Keep a sleep diary to help you and your health care provider figure out what could be causing your insomnia. Write down: ? When you sleep. ? When you wake up during the night. ? How well you sleep. ? How rested you feel the next day. ? Any side effects of medicines you are taking. ? What you eat and drink.  Make your bedroom a dark, comfortable place where it is easy to fall asleep. ? Put up shades or blackout curtains to block light from outside. ? Use a white noise machine to block noise. ? Keep the temperature cool.  Limit screen use before  bedtime. This includes: ? Watching TV. ? Using your smartphone, tablet, or computer.  Stick to a routine that includes going to bed and waking up at the same times every day and night. This can help you fall asleep faster. Consider making a quiet activity, such as reading, part of  your nighttime routine.  Try to avoid taking naps during the day so that you sleep better at night.  Get out of bed if you are still awake after 15 minutes of trying to sleep. Keep the lights down, but try reading or doing a quiet activity. When you feel sleepy, go back to bed. General instructions  Take over-the-counter and prescription medicines only as told by your health care provider.  Exercise regularly, as told by your health care provider. Avoid exercise starting several hours before bedtime.  Use relaxation techniques to manage stress. Ask your health care provider to suggest some techniques that may work well for you. These may include: ? Breathing exercises. ? Routines to release muscle tension. ? Visualizing peaceful scenes.  Make sure that you drive carefully. Avoid driving if you feel very sleepy.  Keep all follow-up visits as told by your health care provider. This is important. Contact a health care provider if:  You are tired throughout the day.  You have trouble in your daily routine due to sleepiness.  You continue to have sleep problems, or your sleep problems get worse. Get help right away if:  You have serious thoughts about hurting yourself or someone else. If you ever feel like you may hurt yourself or others, or have thoughts about taking your own life, get help right away. You can go to your nearest emergency department or call:  Your local emergency services (911 in the [place]  A suicide crisis helpline, such as the [place] at 240-688-9099. This is open 24 hours a day. Summary  Insomnia is a sleep disorder that makes it difficult to fall asleep or stay asleep.  Insomnia can be long-term (chronic) or short-term (acute).  Treatment for insomnia depends on the cause. Treatment may focus on treating an underlying condition that is causing insomnia.  Keep a sleep diary to help you and your health care provider figure out  what could be causing your insomnia. This information is not intended to replace advice given to you by your health care provider. Make sure you discuss any questions you have with your health care provider. Document Revised: 02/13/2017 Document Reviewed: 12/11/2016 Elsevier Patient Education  2020 [employer].

## 2019-10-18 NOTE — Progress Notes (Signed)
@Patient  ID: Natalie Edwards, female    DOB: 06-16-1965, 54 y.o.   MRN: [id number]  Chief Complaint  Patient presents with  . Follow-up    Insomnia, GAD    Referring provider: Caryl Never*  HPI: 54 year old female, current everyday smoker.  Past medical history significant for chronic insomnia.  Patient of Dr. Ander Slade, last seen in office on 05/04/2019.  10/18/2019 Patient presents today for 15-month follow-up visit.  Insomnia is well controlled on current treatment with Belsomra and Restoril. 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  She takes both restoril and belsomra around 2pm and gets about 5-6 hours of sleep before waking up at 9pm. She often wakes up earlier because she cares for her mother. She denies any carryover from medications.   Allergies  Allergen Reactions  . Ambien [Zolpidem Tartrate]     Jitteriness, nervousness, abdominal pain  . Hydroxyzine     Palpitations and jitteriness     Immunization History  Administered Date(s) Administered  . PFIZER SARS-COV-2 Vaccination 06/20/2019, 07/12/2019  . Tdap 03/17/2014    Past Medical History:  Diagnosis Date  . Allergy   . Anemia   . Diverticulosis   . Gall bladder stones 1993  . Gastric ulcer   . GERD (gastroesophageal reflux disease)   . Hypertension   . Renal mass     Tobacco History: Social History   Tobacco Use  Smoking Status Current Every Day Smoker  . Packs/day: 0.25  . Years: 25.00  . Pack years: 6.25  . Types: Cigarettes  Smokeless Tobacco Never Used  Tobacco Comment   Pt tried to quit - helps with anxiety    Ready to quit: No Counseling given: Yes Comment: Pt tried to quit - helps with anxiety    Outpatient Medications Prior to Visit  Medication Sig Dispense Refill  . amLODipine-benazepril (LOTREL) 10-40 MG capsule Take 1 capsule by mouth daily. 30 capsule 11  . esomeprazole (NEXIUM) 40 MG capsule TAKE 1 CAPSULE BY MOUTH 2 TIMES DAILY BEFORE A MEAL. 60 capsule 4  . fluticasone  (FLONASE) 50 MCG/ACT nasal spray Place 2 sprays into both nostrils daily. 16 g 6  . hydrochlorothiazide (HYDRODIURIL) 25 MG tablet TAKE 1 TABLET BY MOUTH EVERY DAY 90 tablet 0  . loratadine (CLARITIN) 10 MG tablet Take 1 tablet (10 mg total) by mouth daily. 90 tablet 1  . QUEtiapine (SEROQUEL) 25 MG tablet Take 1 tablet (25 mg total) by mouth at bedtime. 30 tablet 3  . Suvorexant (BELSOMRA) 10 MG TABS Take 10 mg by mouth at bedtime. 30 tablet 1  . temazepam (RESTORIL) 30 MG capsule Take 1 capsule (30 mg total) by mouth at bedtime as needed for sleep. 30 capsule 3   No facility-administered medications prior to visit.   Review of Systems  Review of Systems  Constitutional: Negative.   Psychiatric/Behavioral: Negative.     Physical Exam  BP 118/72 (BP Location: Left Arm, Cuff Size: Normal)   Pulse 98   Temp (!) 97.3 F (36.3 C) (Oral)   Ht 5\' 3"  (1.6 m)   Wt 179 lb 12.8 oz (81.6 kg)   SpO2 97%   BMI 31.85 kg/m  Physical Exam Constitutional:      Appearance: Normal appearance. She is normal weight. She is not ill-appearing.  Cardiovascular:     Rate and Rhythm: Normal rate and regular rhythm.  Pulmonary:     Effort: Pulmonary effort is normal.     Breath sounds: Normal breath sounds.  Musculoskeletal:        General: Normal range of motion.  Skin:    General: Skin is warm and dry.  Neurological:     General: No focal deficit present.     Mental Status: She is alert and oriented to person, place, and time. Mental status is at baseline.  Psychiatric:        Mood and Affect: Mood normal.        Behavior: Behavior normal.        Thought Content: Thought content normal.        Judgment: Judgment normal.      Lab Results:  CBC    Component Value Date/Time   WBC 17.8 (H) 05/03/2019 1511   RBC 4.30 05/03/2019 1511   HGB 12.3 05/03/2019 1511   HGB 12.3 03/08/2019 0738   HGB 11.7 12/06/2018 0914   HGB 11.4 (L) 09/09/2016 1329   HCT 37.9 05/03/2019 1511   HCT 36.6  12/06/2018 0914   HCT 35.6 09/09/2016 1329   PLT 533.0 (H) 05/03/2019 1511   PLT 493 (H) 03/08/2019 0738   PLT 463 (H) 12/06/2018 0914   MCV 88.1 05/03/2019 1511   MCV 88 12/06/2018 0914   MCV 80.5 09/09/2016 1329   MCH 28.8 03/08/2019 0738   MCHC 32.4 05/03/2019 1511   RDW 15.4 05/03/2019 1511   RDW 15.9 (H) 12/06/2018 0914   RDW 16.8 (H) 09/09/2016 1329   LYMPHSABS 5.8 (H) 05/03/2019 1511   LYMPHSABS 5.2 (H) 12/06/2018 0914   LYMPHSABS 5.9 (H) 09/09/2016 1329   MONOABS 1.3 (H) 05/03/2019 1511   MONOABS 1.0 (H) 09/09/2016 1329   EOSABS 0.2 05/03/2019 1511   EOSABS 0.1 12/06/2018 0914   BASOSABS 0.1 05/03/2019 1511   BASOSABS 0.1 12/06/2018 0914   BASOSABS 0.0 09/09/2016 1329    BMET    Component Value Date/Time   NA 138 05/03/2019 1511   NA 141 01/19/2019 0956   K 4.8 05/03/2019 1511   CL 107 05/03/2019 1511   CO2 26 05/03/2019 1511   GLUCOSE 101 (H) 05/03/2019 1511   BUN 26 (H) 05/03/2019 1511   BUN 22 01/19/2019 0956   CREATININE 1.25 (H) 05/03/2019 1511   CREATININE 1.37 (H) 03/08/2019 0738   CALCIUM 10.1 05/03/2019 1511   GFRNONAA 44 (L) 03/08/2019 0738   GFRAA 51 (L) 03/08/2019 0738    BNP No results found for: BNP  ProBNP No results found for: PROBNP  Imaging: No results found.   Assessment & Plan:   Chronic insomnia - Works night shift, difficulty initiating sleep. Well controlled on presents treatments. Gets approx 5-6 hours of sleep during the day with no carryover from medication - Continue Restoril 30mg  and Belsomra 10mg  at bedtime - Encourage patient aim to get minimum 6 hours of sleep each night and advised not to drive if experiencing excessive fatigue or somnolence - PMP reviewed, no unexpected prescription found - FU with Dr. Ander Slade in 6 months (refill ok until next visit)   Martyn Ehrich, NP 10/18/2019

## 2019-10-18 NOTE — Assessment & Plan Note (Signed)
-   Works night shift, difficulty initiating sleep. Well controlled on presents treatments. Gets approx 5-6 hours of sleep during the day with no carryover from medication - Continue Restoril 30mg  and Belsomra 10mg  at bedtime - Encourage patient aim to get minimum 6 hours of sleep each night and advised not to drive if experiencing excessive fatigue or somnolence - PMP reviewed, no unexpected prescription found - FU with Dr. Ander Slade in 6 months (refill ok until next visit)

## 2019-10-20 ENCOUNTER — Telehealth: Payer: Self-pay | Admitting: Physician Assistant

## 2019-10-20 MED ORDER — ESOMEPRAZOLE MAGNESIUM 40 MG PO CPDR: DELAYED_RELEASE_CAPSULE | ORAL | 1 refills | Status: DC

## 2019-10-20 NOTE — Telephone Encounter (Signed)
30 days with 1 refill have been sent to the pharmacy.

## 2019-11-07 ENCOUNTER — Telehealth: Payer: Self-pay | Admitting: Internal Medicine

## 2019-11-07 NOTE — Telephone Encounter (Signed)
Patient called in and requested for a refill on her medications until her upcoming appointment. Please follow up at your earliest convenience. Patient stated it was ok to leave a detailed message on vm.

## 2019-11-09 MED ORDER — HYDROCHLOROTHIAZIDE 25 MG PO TABS: 25.0000 mg | ORAL_TABLET | Freq: Every day | ORAL | 0 refills | Status: DC

## 2019-11-09 NOTE — Telephone Encounter (Signed)
HCTZ sent to pharmacy.  Amlodipine was written 04/22/2019, #30 with 11 refills. Claritin was written 08/18/2019. She needs to call pharmacy to get these ready.  Please be sure that patient is aware that her upcoming appointment will be in person.

## 2019-11-09 NOTE — Telephone Encounter (Signed)
Patient called in and requested for an update on her medication request. Please follow up at your earliest convenience.

## 2019-11-10 ENCOUNTER — Telehealth: Payer: Self-pay | Admitting: Internal Medicine

## 2019-11-10 NOTE — Telephone Encounter (Signed)
Error, patient stated she would call back to request other medications that she is needing. Patient was informed of note from nurse regarding med refill.

## 2019-11-10 NOTE — Telephone Encounter (Signed)
Patient was informed of note. Patient stated that she would call back to inform pcp of meds she is needing.

## 2019-11-22 ENCOUNTER — Ambulatory Visit: Payer: Medicare Other | Admitting: Internal Medicine

## 2019-11-23 ENCOUNTER — Other Ambulatory Visit: Payer: Self-pay | Admitting: Physician Assistant

## 2019-11-28 NOTE — Progress Notes (Deleted)
     11/28/2019 Natalie Edwards [id number] Jul 31, 1965   Chief Complaint:  History of Present Illness: Natalie Edwards is a 54 year old female with a past medical history of  Anxiety, hypertension, gastric ulcers 2018 leukocytosis, thrombocytosis and iron deficiency. Past cholecystectomy and hysterectomy. She was last seen in our office by Nicoletta Ba PA-C on 05/03/2019 following a visit to the ED for acute abdominal pain. She was instructed to continue Nexium 40mg  bid and to complete a 2 week course of Carafate. An EGD was ordered but was not done.    CTAP 07/31/2016: 1. Apparent wall thickening of the gastric antrum suggesting gastritis. This may, alternatively, be due to lack of distension. Recommend follow-up upper GI or endoscopy on a nonemergent basis. 2. No other evidence of an acute abnormality within the abdomen or pelvis. 3. Left adrenal adenoma. 4. Bilateral low-density renal masses consistent with cysts, some too small to fully characterize. 5. Colonic diverticula without evidence of diverticulitis. 6. Aortic atherosclerosis. 7. Status post cholecystectomy and hysterectomy.  Current Medications, Allergies, Past Medical History, Past Surgical History, Family History and Social History were reviewed in [employer] record.   Review of Systems:   Constitutional: Negative for fever, sweats, chills or weight loss.  Respiratory: Negative for shortness of breath.   Cardiovascular: Negative for chest pain, palpitations and leg swelling.  Gastrointestinal: See HPI.  Musculoskeletal: Negative for back pain or muscle aches.  Neurological: Negative for dizziness, headaches or paresthesias.    Physical Exam: There were no vitals taken for this visit. General: Well developed, w   ***female in no acute distress. Head: Normocephalic and atraumatic. Eyes: No scleral icterus. Conjunctiva pink . Ears: Normal auditory acuity. Mouth: Dentition intact. No ulcers or  lesions.  Lungs: Clear throughout to auscultation. Heart: Regular rate and rhythm, no murmur. Abdomen: Soft, nontender and nondistended. No masses or hepatomegaly. Normal bowel sounds x 4 quadrants.  Rectal: *** Musculoskeletal: Symmetrical with no gross deformities. Extremities: No edema. Neurological: Alert oriented x 4. No focal deficits.  Psychological: Alert and cooperative. Normal mood and affect  Assessment and Recommendations: ***

## 2019-11-29 ENCOUNTER — Ambulatory Visit: Payer: Medicare Other | Admitting: Nurse Practitioner

## 2020-01-22 ENCOUNTER — Other Ambulatory Visit: Payer: Self-pay | Admitting: Physician Assistant

## 2020-02-13 ENCOUNTER — Other Ambulatory Visit: Payer: Self-pay | Admitting: Physician Assistant

## 2020-02-15 ENCOUNTER — Ambulatory Visit (INDEPENDENT_AMBULATORY_CARE_PROVIDER_SITE_OTHER): Payer: 59 | Admitting: Pulmonary Disease

## 2020-02-15 ENCOUNTER — Other Ambulatory Visit: Payer: Self-pay

## 2020-02-15 ENCOUNTER — Encounter: Payer: Self-pay | Admitting: Pulmonary Disease

## 2020-02-15 VITALS — BP 126/78 | HR 97 | Temp 98.9°F | Ht 63.0 in | Wt 184.0 lb

## 2020-02-15 DIAGNOSIS — F5104 Psychophysiologic insomnia: Secondary | ICD-10-CM | POA: Diagnosis not present

## 2020-02-15 MED ORDER — TEMAZEPAM 30 MG PO CAPS: 30.0000 mg | ORAL_CAPSULE | Freq: Every evening | ORAL | 3 refills | Status: DC | PRN

## 2020-02-15 NOTE — Progress Notes (Signed)
Subjective:    Patient ID: Natalie Edwards, female    DOB: [birth year], 54 y.o.   MRN: [id number]  Patient with a history of chronic insomnia  Evaluated in the past about 5 years ago  Restoril has been helping  She is doing the third shift at present Manages to get close to 6 hours of sleep Not able to fall asleep immediately but still gets an adequate number of hours  She does not wake up groggy and appears to be functioning well with use unrestorable  Usually tries to go to bed between 8 and 9 PM-when not working shifts, does third shift currently Takes hours to fall asleep Wakes up routinely over 3 times during the night Final awakening time about 6 AM  She does have a history of snoring, no witnessed apneas She is only sleepy during the day when she does not get adequate rest at night Occasional dryness of her mouth in the mornings She does have headaches  No family history of sleep disordered breathing  She is an active smoker, a pack of cigarettes lasts her about 3 days She is trying to quit but not able to at present  Has a lot of stressors  Past Medical History:  Diagnosis Date  . Allergy   . Anemia   . Diverticulosis   . Gall bladder stones 1993  . Gastric ulcer   . GERD (gastroesophageal reflux disease)   . Hypertension   . Renal mass    Social History   Socioeconomic History  . Marital status: Single    Spouse name: Not on file  . Number of children: 3  . Years of education: Not on file  . Highest education level: High school graduate  Occupational History  . Not on file  Tobacco Use  . Smoking status: Current Every Day Smoker    Packs/day: 0.25    Years: 25.00    Pack years: 6.25    Types: Cigarettes  . Smokeless tobacco: Never Used  . Tobacco comment: Pt tried to quit - helps with anxiety   Vaping Use  . Vaping Use: Never used  Substance and Sexual Activity  . Alcohol use: Not Currently  . Drug use: No  . Sexual activity: Yes    Comment:  hysterectomy  Other Topics Concern  . Not on file  Social History Narrative   Lives at home in an apartment. Her mother lives with her.    Right handed   Caffeine: drinks approx. 36 oz of pepsi per day. Sometimes drinks 2 cups of coffee in a day as well.    Social Determinants of Health   Financial Resource Strain:   . Difficulty of Paying Living Expenses: Not on file  Food Insecurity:   . Worried About Charity fundraiser in the Last Year: Not on file  . Ran Out of Food in the Last Year: Not on file  Transportation Needs:   . Lack of Transportation (Medical): Not on file  . Lack of Transportation (Non-Medical): Not on file  Physical Activity:   . Days of Exercise per Week: Not on file  . Minutes of Exercise per Session: Not on file  Stress:   . Feeling of Stress : Not on file  Social Connections:   . Frequency of Communication with Friends and Family: Not on file  . Frequency of Social Gatherings with Friends and Family: Not on file  . Attends Religious Services: Not on file  . Active Member  of Clubs or Organizations: Not on file  . Attends Archivist Meetings: Not on file  . Marital Status: Not on file  Intimate Partner Violence:   . Fear of Current or Ex-Partner: Not on file  . Emotionally Abused: Not on file  . Physically Abused: Not on file  . Sexually Abused: Not on file   Family History  Problem Relation Age of Onset  . Cancer Maternal Uncle        Lung  . Cancer Maternal Uncle        Lung  . Cancer Maternal Uncle        Lung  . Headache Neg Hx        "I don't think so"  . Migraines Neg Hx        "I don't think so"   Review of Systems  Constitutional: Negative for fever and unexpected weight change.  HENT: Negative for congestion, dental problem, ear pain, nosebleeds, postnasal drip, rhinorrhea, sinus pressure, sneezing, sore throat and trouble swallowing.   Eyes: Negative for redness and itching.  Respiratory: Negative.   Cardiovascular:  Negative.   Gastrointestinal: Negative.   Genitourinary: Negative for dysuria.  Musculoskeletal: Negative for joint swelling.  Skin: Negative for rash.  Allergic/Immunologic: Negative.  Negative for environmental allergies, food allergies and immunocompromised state.  Neurological: Negative for headaches.  Hematological: Does not bruise/bleed easily.  Psychiatric/Behavioral: Negative for dysphoric mood. The patient is nervous/anxious.       Objective:   Physical Exam Constitutional:      Appearance: Normal appearance.  HENT:     Head: Normocephalic.     Mouth/Throat:     Comments: Mallampati 1, Eyes:     General:        Right eye: No discharge.        Left eye: No discharge.  Cardiovascular:     Rate and Rhythm: Normal rate and regular rhythm.     Pulses: Normal pulses.     Heart sounds: No murmur heard.  No friction rub.  Pulmonary:     Effort: Pulmonary effort is normal. No respiratory distress.     Breath sounds: No stridor. No wheezing or rhonchi.  Musculoskeletal:     Cervical back: No rigidity or tenderness.  Neurological:     Mental Status: She is alert.  Psychiatric:        Mood and Affect: Mood normal.    Vitals:   02/15/20 1354  BP: 126/78  Pulse: 97  Temp: 98.9 F (37.2 C)  SpO2: 99%   Results of the Epworth flowsheet 01/06/2019  Sitting and reading 0  Watching TV 0  Sitting, inactive in a public place (e.g. a theatre or a meeting) 0  As a passenger in a car for an hour without a break 0  Lying down to rest in the afternoon when circumstances permit 0  Sitting and talking to someone 0  Sitting quietly after a lunch without alcohol 0  In a car, while stopped for a few minutes in traffic 0  Total score 0      Assessment & Plan:  Sleep onset and sleep maintenance insomnia -Continue with restoral -Stop Belsomra as it was not working  History of depression and anxiety -Continue optimize treatment  Nicotine addiction -Working on quitting  smoking -Counseled about the need to quit  Plan: Continue Restoril 30 mg  Smoking cessation is important  Importance of stimulus control discussed  Sleep disordered breathing also discussed as possibly a contributor  to sleep maintenance insomnia

## 2020-02-15 NOTE — Patient Instructions (Addendum)
We will refill your temazepam  You can stop using Belsomra  I will see you back in 6 months Call with any significant concerns

## 2020-02-20 ENCOUNTER — Other Ambulatory Visit (HOSPITAL_COMMUNITY): Payer: Self-pay | Admitting: Internal Medicine

## 2020-02-20 MED FILL — ESOMEPRAZOLE MAG DR 40 MG C: 40 | 90 days supply | Qty: 180 | Fill #0

## 2020-02-20 MED FILL — HYDROCHLOROTHIAZIDE 25 MG T: 25 | 90 days supply | Qty: 90 | Fill #0

## 2020-02-20 MED FILL — MIRTAZAPINE 15 MG TABLET: 15 | 90 days supply | Qty: 90 | Fill #0

## 2020-02-20 MED FILL — AMLODIPINE-BENAZ 10-40 MG: 10-40 | 90 days supply | Qty: 90 | Fill #0

## 2020-03-06 ENCOUNTER — Telehealth: Payer: Self-pay | Admitting: Pulmonary Disease

## 2020-03-06 ENCOUNTER — Other Ambulatory Visit: Payer: Self-pay | Admitting: Pulmonary Disease

## 2020-03-06 NOTE — Telephone Encounter (Signed)
Attempted to call pt but unable to reach. Left message for her to return call. 

## 2020-03-06 NOTE — Telephone Encounter (Signed)
Called and spoke with pt who stated she was needing her temazepam refilled. Pt said she thought this was going to be refilled the day of her OV with AO 12/1. Looked at pt's med list and saw where Rx [facility] [address] not [facility] off [address].  Pt said [facility] to see if [facility] on [facility] and if so, she would get it from there. She said if she needed to have the Rx resent to [facility] off [address] that she would call us back.

## 2020-03-26 MED FILL — ZOLPIDEM TARTRATE 10 MG TAB: 10 | 30 days supply | Qty: 30 | Fill #0

## 2020-04-25 ENCOUNTER — Other Ambulatory Visit (HOSPITAL_COMMUNITY): Payer: Self-pay | Admitting: Adult Medicine

## 2020-04-25 DIAGNOSIS — D75839 Thrombocytosis, unspecified: Secondary | ICD-10-CM | POA: Diagnosis not present

## 2020-04-25 DIAGNOSIS — E278 Other specified disorders of adrenal gland: Secondary | ICD-10-CM | POA: Diagnosis not present

## 2020-04-25 DIAGNOSIS — E559 Vitamin D deficiency, unspecified: Secondary | ICD-10-CM | POA: Diagnosis not present

## 2020-04-25 DIAGNOSIS — D72829 Elevated white blood cell count, unspecified: Secondary | ICD-10-CM | POA: Diagnosis not present

## 2020-04-25 DIAGNOSIS — Z79899 Other long term (current) drug therapy: Secondary | ICD-10-CM | POA: Diagnosis not present

## 2020-04-25 MED FILL — ZOLPIDEM TARTRATE 10 MG TAB: 10 | 30 days supply | Qty: 30 | Fill #0

## 2020-05-21 ENCOUNTER — Other Ambulatory Visit (HOSPITAL_COMMUNITY): Payer: Self-pay | Admitting: Internal Medicine

## 2020-05-21 DIAGNOSIS — E785 Hyperlipidemia, unspecified: Secondary | ICD-10-CM | POA: Diagnosis not present

## 2020-05-21 DIAGNOSIS — F5101 Primary insomnia: Secondary | ICD-10-CM | POA: Diagnosis not present

## 2020-05-21 DIAGNOSIS — E559 Vitamin D deficiency, unspecified: Secondary | ICD-10-CM | POA: Diagnosis not present

## 2020-05-21 DIAGNOSIS — I10 Essential (primary) hypertension: Secondary | ICD-10-CM | POA: Diagnosis not present

## 2020-05-21 DIAGNOSIS — Z79899 Other long term (current) drug therapy: Secondary | ICD-10-CM | POA: Diagnosis not present

## 2020-05-21 MED FILL — HYDROCHLOROTHIAZIDE 25 MG T: 25 | 90 days supply | Qty: 90 | Fill #0

## 2020-05-21 MED FILL — ESOMEPRAZOLE MAG DR 40 MG C: 40 | 90 days supply | Qty: 180 | Fill #0

## 2020-05-21 MED FILL — AMLODIPINE-BENAZ 10-40 MG: 10-40 | 90 days supply | Qty: 90 | Fill #0

## 2020-05-21 MED FILL — MIRTAZAPINE 15 MG TABLET: 15 | 90 days supply | Qty: 90 | Fill #0

## 2020-05-23 MED FILL — ZOLPIDEM TARTRATE 10 MG TAB: 10 | 30 days supply | Qty: 30 | Fill #0

## 2020-06-18 ENCOUNTER — Other Ambulatory Visit (HOSPITAL_COMMUNITY): Payer: Self-pay

## 2020-06-18 MED FILL — Zolpidem Tartrate Tab 10 MG: ORAL | 30 days supply | Qty: 30 | Fill #0 | Status: CN

## 2020-06-19 ENCOUNTER — Other Ambulatory Visit (HOSPITAL_COMMUNITY): Payer: Self-pay

## 2020-06-20 ENCOUNTER — Telehealth: Payer: Self-pay | Admitting: Pulmonary Disease

## 2020-06-20 ENCOUNTER — Other Ambulatory Visit (HOSPITAL_COMMUNITY): Payer: Self-pay

## 2020-06-20 MED FILL — Zolpidem Tartrate Tab 10 MG: ORAL | 30 days supply | Qty: 30 | Fill #0 | Status: AC

## 2020-06-20 MED FILL — Zolpidem Tartrate Tab 10 MG: ORAL | 30 days supply | Qty: 30 | Fill #0 | Status: CN

## 2020-06-20 NOTE — Telephone Encounter (Signed)
Okay with me 

## 2020-06-20 NOTE — Telephone Encounter (Signed)
Spoke with Brayton Layman at [facility]  She wants to let Dr Ander Slade know that Dr Lynnda Child office called and told her that they are d/c'ing temazepam  Dr Nancy Fetter has her on [place] and would like pt to continue this alone  She will be prescribing the Lorrin Mais  Will forward to Dr Ander Slade to let him know  I have removed the temazepam from med list

## 2020-06-21 NOTE — Telephone Encounter (Signed)
Noted.  Will close encounter.  

## 2020-06-29 ENCOUNTER — Telehealth: Payer: Self-pay | Admitting: Hematology and Oncology

## 2020-06-29 NOTE — Telephone Encounter (Signed)
Scheduled appts per 4/14 sch msg. Called pt, no answer. Left msg with appts date and times.

## 2020-07-02 ENCOUNTER — Telehealth: Payer: Self-pay | Admitting: Hematology and Oncology

## 2020-07-02 NOTE — Telephone Encounter (Signed)
R/s appt per 4/18 sch msg. Pt aware.  

## 2020-07-02 NOTE — Telephone Encounter (Signed)
Called pt to r/s appt per 4/18 sch msg. No answer. Left vm for pt to call back to r/s,

## 2020-07-04 ENCOUNTER — Telehealth: Payer: Self-pay | Admitting: Pulmonary Disease

## 2020-07-04 NOTE — Telephone Encounter (Signed)
Lm for patient.  

## 2020-07-04 NOTE — Telephone Encounter (Signed)
Patient is returning phone call. Patient phone number is [phone]. May leave detailed message on voicemail.

## 2020-07-04 NOTE — Telephone Encounter (Signed)
lmtcb for pt.  Per previous phone note from 4.6.22 Dr. Nancy Fetter was filling Ambien and took her off Temazepam. A new ambien script was sent to pharmacy on 3.7.2022 with refills.

## 2020-07-05 NOTE — Telephone Encounter (Signed)
lmtcb for pt.  

## 2020-07-06 ENCOUNTER — Telehealth: Payer: Self-pay | Admitting: Pulmonary Disease

## 2020-07-06 NOTE — Telephone Encounter (Signed)
Lm for patient. Please refer to 06/20/2020 and 07/04/2020 phone phone.

## 2020-07-06 NOTE — Telephone Encounter (Signed)
Attempted to call pt but unable to reach. Left message for her to return call. Due to multiple attempts trying to reach pt with [place] unable to do so, per triage protocol encounter will be closed.

## 2020-07-10 NOTE — Telephone Encounter (Signed)
lmtcb for pt.  

## 2020-07-12 ENCOUNTER — Telehealth: Payer: Self-pay | Admitting: Hematology and Oncology

## 2020-07-12 NOTE — Telephone Encounter (Signed)
R/s 5/2 appt per provider schedule conflict. Called and left msg with new appt date and time

## 2020-07-12 NOTE — Telephone Encounter (Signed)
lmtcb for pt.  Will close encounter since we havent been able to reach pt with several attempts.

## 2020-07-16 ENCOUNTER — Ambulatory Visit: Payer: Medicare Other | Admitting: Hematology and Oncology

## 2020-07-16 ENCOUNTER — Other Ambulatory Visit: Payer: Medicare Other

## 2020-07-17 ENCOUNTER — Other Ambulatory Visit: Payer: Self-pay | Admitting: Hematology and Oncology

## 2020-07-17 DIAGNOSIS — D500 Iron deficiency anemia secondary to blood loss (chronic): Secondary | ICD-10-CM

## 2020-07-17 NOTE — Progress Notes (Signed)
No show

## 2020-07-18 ENCOUNTER — Inpatient Hospital Stay: Payer: 59 | Attending: Hematology and Oncology

## 2020-07-18 ENCOUNTER — Inpatient Hospital Stay: Payer: 59 | Admitting: Hematology and Oncology

## 2020-07-18 DIAGNOSIS — D72828 Other elevated white blood cell count: Secondary | ICD-10-CM

## 2020-07-18 DIAGNOSIS — D75839 Thrombocytosis, unspecified: Secondary | ICD-10-CM

## 2020-07-18 DIAGNOSIS — D500 Iron deficiency anemia secondary to blood loss (chronic): Secondary | ICD-10-CM

## 2020-07-24 ENCOUNTER — Other Ambulatory Visit (HOSPITAL_COMMUNITY): Payer: Self-pay

## 2020-07-24 MED FILL — Mirtazapine Tab 15 MG: ORAL | 90 days supply | Qty: 90 | Fill #0 | Status: CN

## 2020-08-02 ENCOUNTER — Other Ambulatory Visit (HOSPITAL_COMMUNITY): Payer: Self-pay

## 2020-08-02 MED FILL — Zolpidem Tartrate Tab 10 MG: ORAL | 30 days supply | Qty: 30 | Fill #1 | Status: AC

## 2020-08-15 ENCOUNTER — Other Ambulatory Visit (HOSPITAL_COMMUNITY): Payer: Self-pay

## 2020-08-15 MED FILL — Hydrochlorothiazide Tab 25 MG: ORAL | 90 days supply | Qty: 90 | Fill #0 | Status: AC

## 2020-08-15 MED FILL — Mirtazapine Tab 15 MG: ORAL | 90 days supply | Qty: 90 | Fill #0 | Status: AC

## 2020-08-15 MED FILL — Amlodipine Besylate-Benazepril HCl Cap 10-40 MG: ORAL | 90 days supply | Qty: 90 | Fill #0 | Status: AC

## 2020-08-15 MED FILL — Esomeprazole Magnesium Cap Delayed Release 40 MG (Base Eq): ORAL | 90 days supply | Qty: 180 | Fill #0 | Status: AC

## 2020-08-23 IMAGING — CT CT HEAD WITHOUT CONTRAST
3 of 4 series · 13 of 47 positions shown, 15 images · non-contrast
Comparison: None.

CLINICAL DATA: Chronic headaches

EXAM:
CT HEAD WITHOUT CONTRAST
TECHNIQUE: Contiguous axial images were obtained from the base of the skull
through the vertex without intravenous contrast.

[Series 3: head without · axial · non-contrast · 0.41mm/px · z∈[-106,+24]mm · 7 of 36 slices shown, 9 images]
[im 5/36  brain]
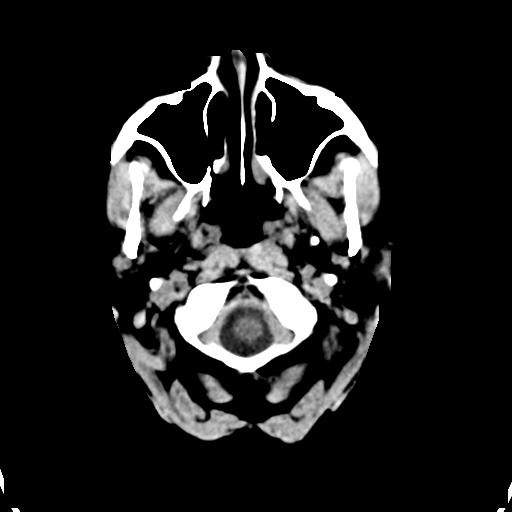
[im 5/36  bone]
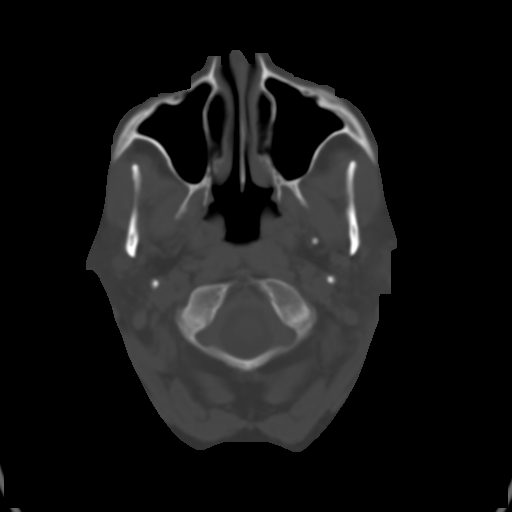
[im 9/36  brain]
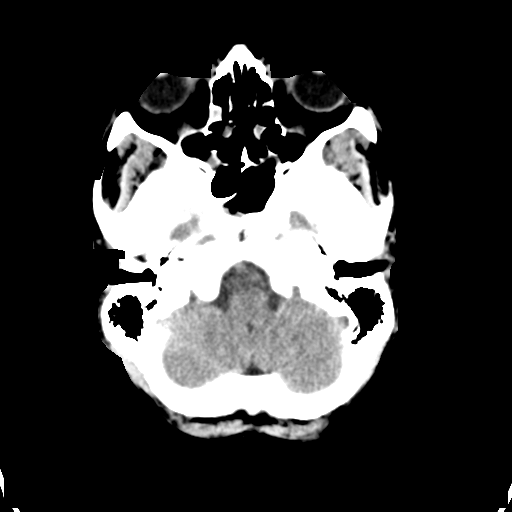
[im 14/36  brain]
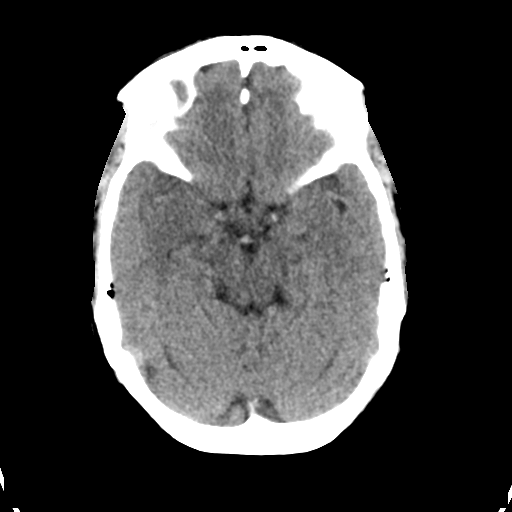
[im 18/36  brain]
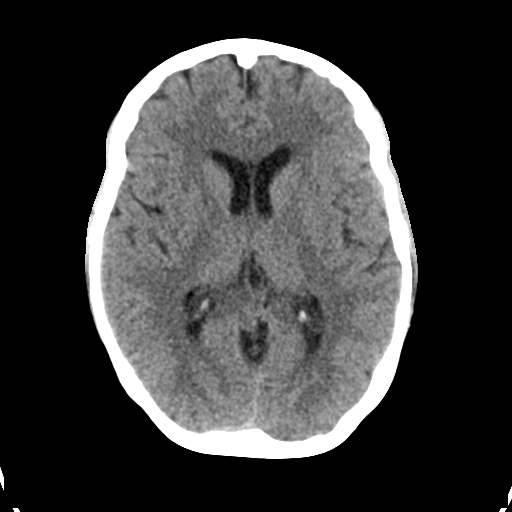
[im 22/36  brain]
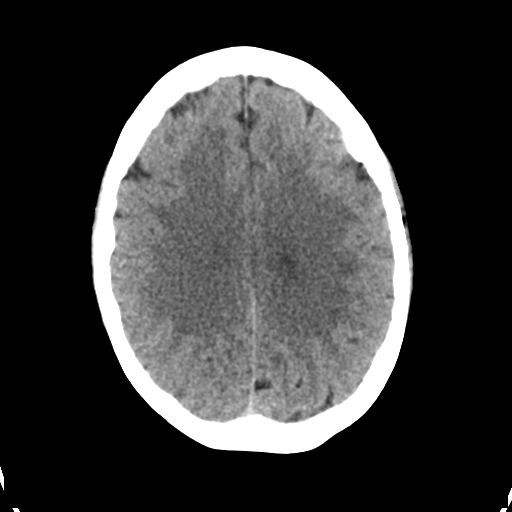
[im 22/36  bone]
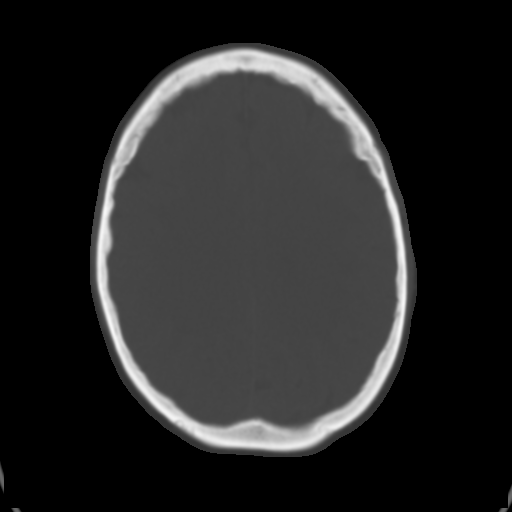
[im 27/36  brain]
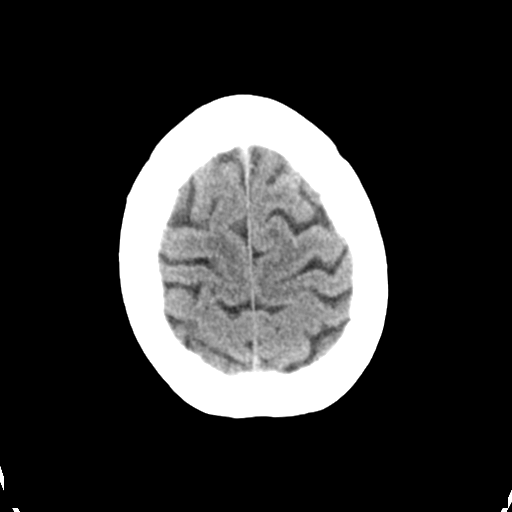
[im 31/36  brain]
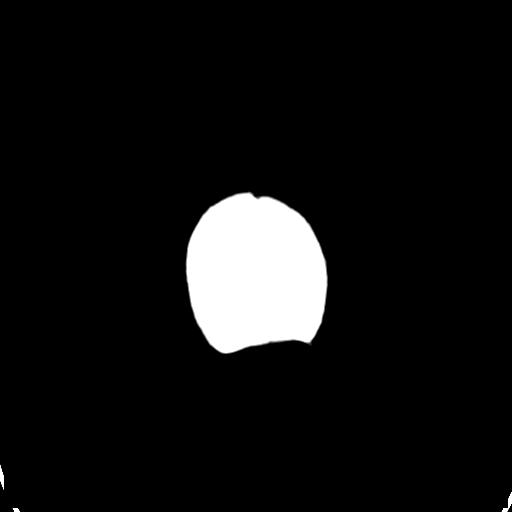

[Series 5: head without cor · coronal · non-contrast · 0.35mm/px · 3 of 67 slices shown]
[im 23/67  brain]
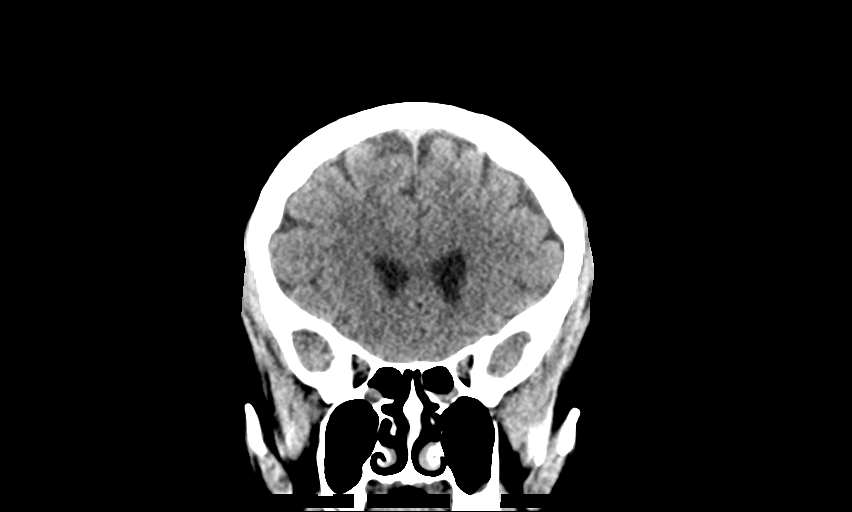
[im 30/67  brain]
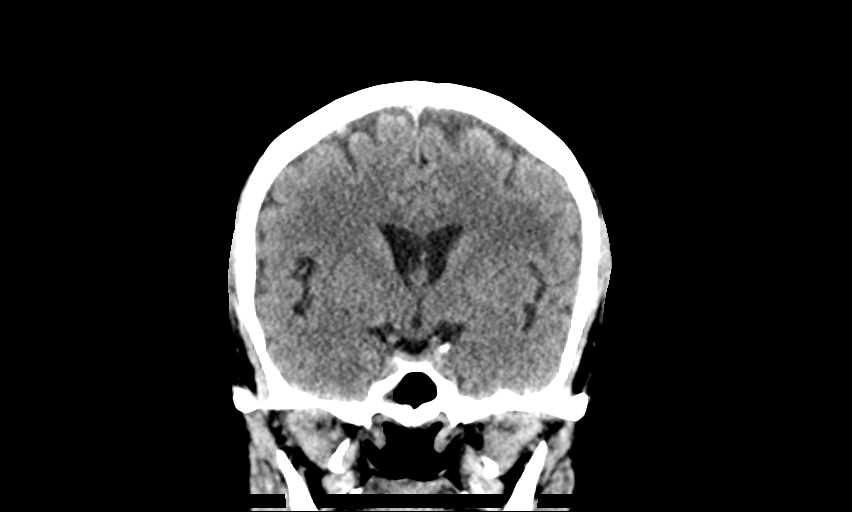
[im 37/67  brain]
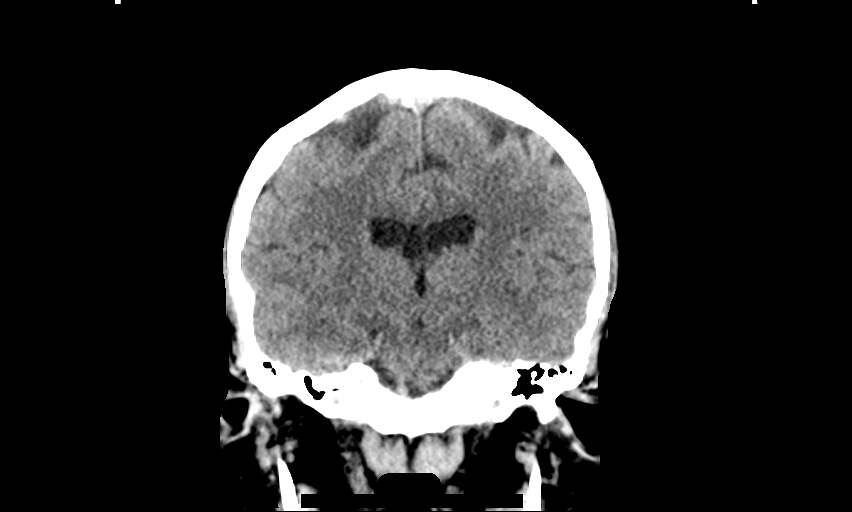

[Series 6: head without sag · sagittal · non-contrast · 0.35mm/px · 3 of 63 slices shown]
[im 21/63  brain]
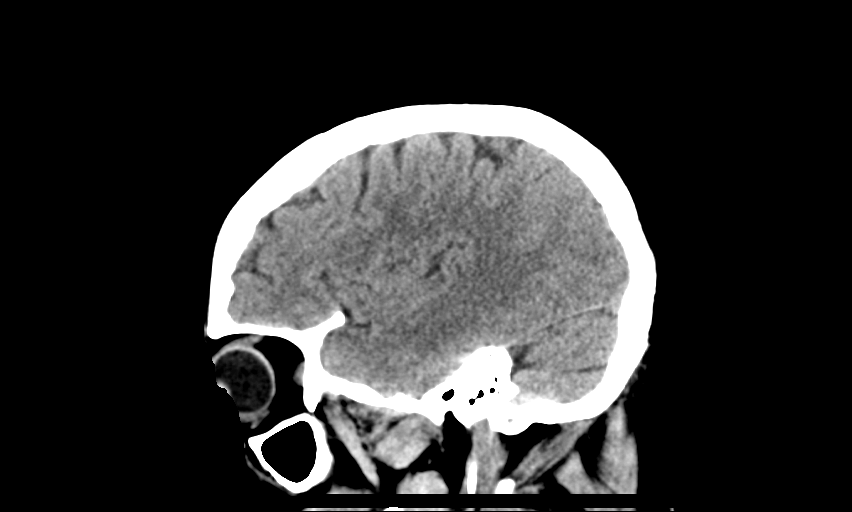
[im 32/63  brain]
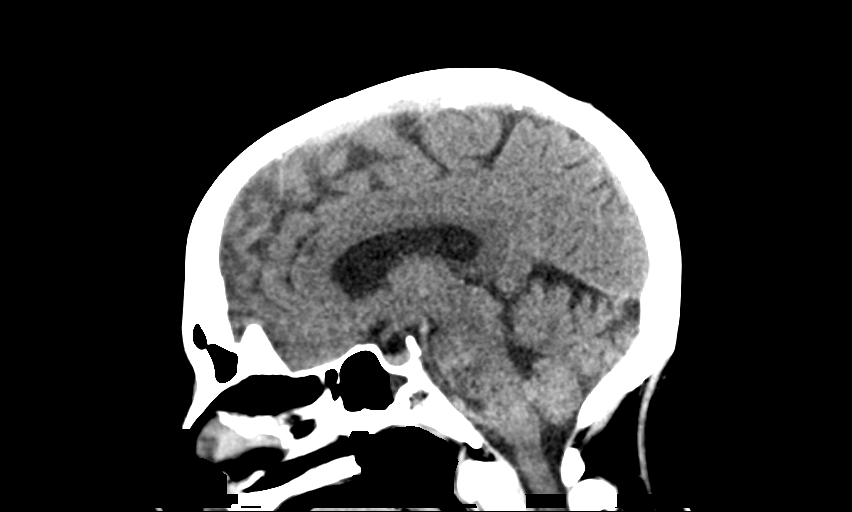
[im 42/63  brain]
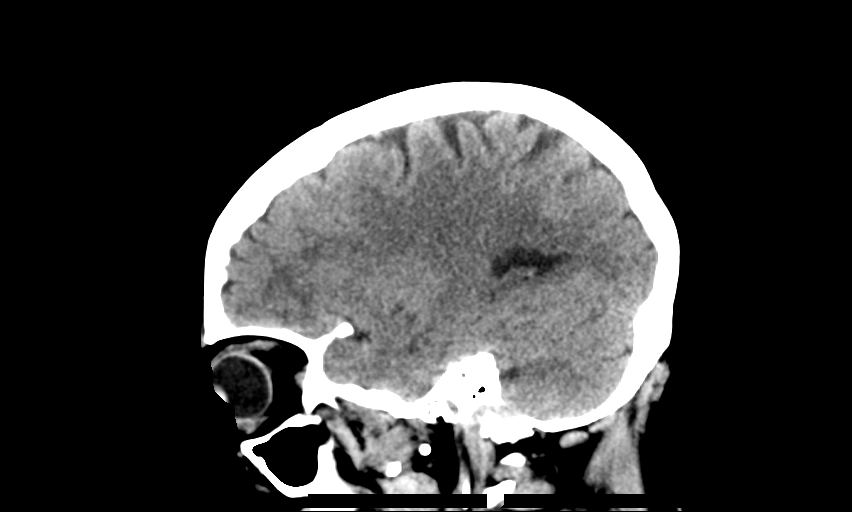

[13 of 47 positions shown; findings below may reference images not displayed]

FINDINGS: Brain: No evidence of acute infarction, hemorrhage, hydrocephalus,
extra-axial collection or mass lesion/mass effect.

Vascular: No hyperdense vessel or unexpected calcification.

Skull: Normal. Negative for fracture or focal lesion.

Sinuses/Orbits: Mucosal thickening is noted within the ethmoid
sinuses. Mucosal retention cysts are noted within the right
maxillary antrum.

Other: None
IMPRESSION: Mild sinus disease without acute intracranial abnormality.

## 2020-08-30 ENCOUNTER — Other Ambulatory Visit (HOSPITAL_COMMUNITY): Payer: Self-pay

## 2020-09-28 DIAGNOSIS — Z20822 Contact with and (suspected) exposure to covid-19: Secondary | ICD-10-CM | POA: Diagnosis not present

## 2020-10-23 ENCOUNTER — Other Ambulatory Visit (HOSPITAL_COMMUNITY): Payer: Self-pay

## 2020-11-09 ENCOUNTER — Other Ambulatory Visit (HOSPITAL_COMMUNITY): Payer: Self-pay

## 2020-11-09 MED FILL — Esomeprazole Magnesium Cap Delayed Release 40 MG (Base Eq): ORAL | 90 days supply | Qty: 180 | Fill #1 | Status: AC

## 2020-11-09 MED FILL — Amlodipine Besylate-Benazepril HCl Cap 10-40 MG: ORAL | 90 days supply | Qty: 90 | Fill #1 | Status: AC

## 2020-11-09 MED FILL — Hydrochlorothiazide Tab 25 MG: ORAL | 90 days supply | Qty: 90 | Fill #1 | Status: AC

## 2020-11-09 MED FILL — Mirtazapine Tab 15 MG: ORAL | 90 days supply | Qty: 90 | Fill #1 | Status: AC

## 2020-11-10 ENCOUNTER — Other Ambulatory Visit (HOSPITAL_COMMUNITY): Payer: Self-pay

## 2020-11-28 ENCOUNTER — Encounter: Payer: Self-pay | Admitting: Pulmonary Disease

## 2020-11-28 ENCOUNTER — Other Ambulatory Visit: Payer: Self-pay

## 2020-11-28 ENCOUNTER — Other Ambulatory Visit (HOSPITAL_COMMUNITY): Payer: Self-pay

## 2020-11-28 ENCOUNTER — Ambulatory Visit (INDEPENDENT_AMBULATORY_CARE_PROVIDER_SITE_OTHER): Payer: 59 | Admitting: Pulmonary Disease

## 2020-11-28 VITALS — BP 112/70 | HR 103 | Temp 98.0°F | Ht 63.0 in | Wt 177.0 lb

## 2020-11-28 DIAGNOSIS — F5104 Psychophysiologic insomnia: Secondary | ICD-10-CM

## 2020-11-28 MED ORDER — SUVOREXANT 20 MG PO TABS
20.0000 mg | ORAL_TABLET | Freq: Every evening | ORAL | 3 refills | Status: DC
  Filled 2020-11-28: qty 30, 30d supply, fill #0
  Filled 2020-11-28: qty 20, 20d supply, fill #0
  Filled 2020-12-06: qty 20, 20d supply, fill #1
  Filled 2020-12-26: qty 30, 30d supply, fill #1

## 2020-11-28 MED ORDER — TEMAZEPAM 30 MG PO CAPS
30.0000 mg | ORAL_CAPSULE | Freq: Every evening | ORAL | 1 refills | Status: DC | PRN
  Filled 2020-11-28 – 2020-12-03 (×2): qty 30, 30d supply, fill #0
  Filled 2020-12-31: qty 30, 30d supply, fill #1

## 2020-11-28 NOTE — Patient Instructions (Signed)
Chronic insomnia  Restoril 30 mg nightly Belsomra 20 mg  -Try not to use both of them at the same time, may alternate  I will see you in 3 months  Call with significant concerns

## 2020-11-28 NOTE — Progress Notes (Signed)
Subjective:    Patient ID: Natalie Edwards, female    DOB: [birth year], 55 y.o.   MRN: [id number]  Patient with a history of chronic insomnia  Evaluated in the past about 5 years ago  Restoril has been helping-uses 30 mg nightly -Runs out of medications  She is doing the third shift at present Manages to get close to 6 hours of sleep Still not able to fall asleep immediately  Medication appears to be working well  Usually tries to go to bed between 8 and 9 PM-when not working shifts, does third shift currently Takes hours to fall asleep Wakes up routinely over 3 times during the night Final awakening time about 6 AM  She does have a history of snoring, no witnessed apneas She is only sleepy during the day when she does not get adequate rest at night Occasional dryness of her mouth in the mornings She does have headaches  No family history of sleep disordered breathing  She is an active smoker, a pack of cigarettes lasts her about 3 days Continues to try to quit, not able to at present  Has a lot of stressors  Past Medical History:  Diagnosis Date   Allergy    Anemia    Diverticulosis    Gall bladder stones 1993   Gastric ulcer    GERD (gastroesophageal reflux disease)    Hypertension    Renal mass    Social History   Socioeconomic History   Marital status: Single    Spouse name: Not on file   Number of children: 3   Years of education: Not on file   Highest education level: High school graduate  Occupational History   Not on file  Tobacco Use   Smoking status: Every Day    Packs/day: 0.25    Years: 25.00    Pack years: 6.25    Types: Cigarettes   Smokeless tobacco: Never   Tobacco comments:    Pt tried to quit - helps with anxiety   Vaping Use   Vaping Use: Never used  Substance and Sexual Activity   Alcohol use: Not Currently   Drug use: No   Sexual activity: Yes    Comment: hysterectomy  Other Topics Concern   Not on file  Social History  Narrative   Lives at home in an apartment. Her mother lives with her.    Right handed   Caffeine: drinks approx. 36 oz of pepsi per day. Sometimes drinks 2 cups of coffee in a day as well.    Social Determinants of Health   Financial Resource Strain: Not on file  Food Insecurity: Not on file  Transportation Needs: Not on file  Physical Activity: Not on file  Stress: Not on file  Social Connections: Not on file  Intimate Partner Violence: Not on file   Family History  Problem Relation Age of Onset   Cancer Maternal Uncle        Lung   Cancer Maternal Uncle        Lung   Cancer Maternal Uncle        Lung   Headache Neg Hx        "I don't think so"   Migraines Neg Hx        "I don't think so"   Review of Systems  Constitutional:  Negative for fever and unexpected weight change.  HENT:  Negative for congestion, dental problem, ear pain, nosebleeds, postnasal drip, rhinorrhea, sinus pressure, sneezing,  sore throat and trouble swallowing.   Eyes:  Negative for redness and itching.  Respiratory: Negative.    Cardiovascular: Negative.   Gastrointestinal: Negative.   Genitourinary:  Negative for dysuria.  Musculoskeletal:  Negative for joint swelling.  Skin:  Negative for rash.  Allergic/Immunologic: Negative.  Negative for environmental allergies, food allergies and immunocompromised state.  Neurological:  Negative for headaches.  Hematological:  Does not bruise/bleed easily.  Psychiatric/Behavioral:  Negative for dysphoric mood. The patient is nervous/anxious.      Objective:   Physical Exam Constitutional:      Appearance: Normal appearance.  HENT:     Head: Normocephalic.     Mouth/Throat:     Comments: Mallampati 1, Eyes:     General:        Right eye: No discharge.        Left eye: No discharge.  Cardiovascular:     Rate and Rhythm: Normal rate and regular rhythm.     Pulses: Normal pulses.     Heart sounds: No murmur heard.   No friction rub.  Pulmonary:      Effort: Pulmonary effort is normal. No respiratory distress.     Breath sounds: No stridor. No wheezing or rhonchi.  Musculoskeletal:     Cervical back: No rigidity or tenderness.  Neurological:     Mental Status: She is alert.  Psychiatric:        Mood and Affect: Mood normal.   Vitals:   11/28/20 1145  BP: 112/70  Pulse: (!) 103  Temp: 98 F (36.7 C)  SpO2: 97%       Assessment & Plan:  Sleep onset and sleep maintenance insomnia -Continue with Restoril -Prescription for Belsomra 20 mg to be tried when she is out of restoral  History of depression and anxiety -Continue to optimize treatment  Nicotine addiction -She continues to work on quitting smoking  Plan: Continue Restoril 30 mg Belsomra 20 mg may be used in place of restoril  Smoking cessation encouraged  Importance of stimulus control discussed  I will see her back in about 3 months

## 2020-11-29 ENCOUNTER — Other Ambulatory Visit (HOSPITAL_COMMUNITY): Payer: Self-pay

## 2020-12-03 ENCOUNTER — Other Ambulatory Visit (HOSPITAL_COMMUNITY): Payer: Self-pay

## 2020-12-06 ENCOUNTER — Other Ambulatory Visit (HOSPITAL_COMMUNITY): Payer: Self-pay

## 2020-12-12 ENCOUNTER — Other Ambulatory Visit (HOSPITAL_COMMUNITY): Payer: Self-pay

## 2020-12-12 ENCOUNTER — Encounter: Payer: Self-pay | Admitting: Internal Medicine

## 2020-12-13 ENCOUNTER — Other Ambulatory Visit (HOSPITAL_COMMUNITY): Payer: Self-pay

## 2020-12-18 ENCOUNTER — Other Ambulatory Visit (HOSPITAL_COMMUNITY): Payer: Self-pay

## 2020-12-26 ENCOUNTER — Other Ambulatory Visit (HOSPITAL_COMMUNITY): Payer: Self-pay

## 2020-12-27 ENCOUNTER — Encounter: Payer: 59 | Admitting: Obstetrics & Gynecology

## 2020-12-27 ENCOUNTER — Other Ambulatory Visit (HOSPITAL_COMMUNITY): Payer: Self-pay

## 2020-12-28 ENCOUNTER — Other Ambulatory Visit (HOSPITAL_COMMUNITY): Payer: Self-pay

## 2020-12-31 ENCOUNTER — Other Ambulatory Visit (HOSPITAL_COMMUNITY): Payer: Self-pay

## 2021-01-14 ENCOUNTER — Encounter: Payer: Self-pay | Admitting: Obstetrics and Gynecology

## 2021-01-14 ENCOUNTER — Other Ambulatory Visit (HOSPITAL_COMMUNITY)
Admission: RE | Admit: 2021-01-14 | Discharge: 2021-01-14 | Disposition: A | Discharge status: Home / Self Care | Payer: 59 | Source: Ambulatory Visit | Attending: Family Medicine | Admitting: Family Medicine

## 2021-01-14 ENCOUNTER — Ambulatory Visit (INDEPENDENT_AMBULATORY_CARE_PROVIDER_SITE_OTHER): Payer: 59 | Admitting: Obstetrics and Gynecology

## 2021-01-14 ENCOUNTER — Other Ambulatory Visit: Payer: Self-pay

## 2021-01-14 VITALS — BP 124/84 | HR 98 | Ht 63.0 in | Wt 176.7 lb

## 2021-01-14 DIAGNOSIS — Z202 Contact with and (suspected) exposure to infections with a predominantly sexual mode of transmission: Secondary | ICD-10-CM

## 2021-01-14 DIAGNOSIS — Z01419 Encounter for gynecological examination (general) (routine) without abnormal findings: Secondary | ICD-10-CM

## 2021-01-14 DIAGNOSIS — Z113 Encounter for screening for infections with a predominantly sexual mode of transmission: Secondary | ICD-10-CM | POA: Diagnosis not present

## 2021-01-14 DIAGNOSIS — Z1151 Encounter for screening for human papillomavirus (HPV): Secondary | ICD-10-CM | POA: Insufficient documentation

## 2021-01-14 DIAGNOSIS — Z1331 Encounter for screening for depression: Secondary | ICD-10-CM | POA: Diagnosis not present

## 2021-01-14 DIAGNOSIS — Z1231 Encounter for screening mammogram for malignant neoplasm of breast: Secondary | ICD-10-CM

## 2021-01-14 NOTE — Patient Instructions (Signed)
Health Maintenance, Female Adopting a healthy lifestyle and getting preventive care are important in promoting health and wellness. Ask your health care provider about: The right schedule for you to have regular tests and exams. Things you can do on your own to prevent diseases and keep yourself healthy. What should I know about diet, weight, and exercise? Eat a healthy diet  Eat a diet that includes plenty of vegetables, fruits, low-fat dairy products, and lean protein. Do not eat a lot of foods that are high in solid fats, added sugars, or sodium. Maintain a healthy weight Body mass index (BMI) is used to identify weight problems. It estimates body fat based on height and weight. Your health care provider can help determine your BMI and help you achieve or maintain a healthy weight. Get regular exercise Get regular exercise. This is one of the most important things you can do for your health. Most adults should: Exercise for at least 150 minutes each week. The exercise should increase your heart rate and make you sweat (moderate-intensity exercise). Do strengthening exercises at least twice a week. This is in addition to the moderate-intensity exercise. Spend less time sitting. Even light physical activity can be beneficial. Watch cholesterol and blood lipids Have your blood tested for lipids and cholesterol at 56 years of age, then have this test every 5 years. Have your cholesterol levels checked more often if: Your lipid or cholesterol levels are high. You are older than 56 years of age. You are at high risk for heart disease. What should I know about cancer screening? Depending on your health history and family history, you may need to have cancer screening at various ages. This may include screening for: Breast cancer. Cervical cancer. Colorectal cancer. Skin cancer. Lung cancer. What should I know about heart disease, diabetes, and high blood pressure? Blood pressure and heart  disease High blood pressure causes heart disease and increases the risk of stroke. This is more likely to develop in people who have high blood pressure readings, are of African descent, or are overweight. Have your blood pressure checked: Every 3-5 years if you are 18-39 years of age. Every year if you are 40 years old or older. Diabetes Have regular diabetes screenings. This checks your fasting blood sugar level. Have the screening done: Once every three years after age 40 if you are at a normal weight and have a low risk for diabetes. More often and at a younger age if you are overweight or have a high risk for diabetes. What should I know about preventing infection? Hepatitis B If you have a higher risk for hepatitis B, you should be screened for this virus. Talk with your health care provider to find out if you are at risk for hepatitis B infection. Hepatitis C Testing is recommended for: Everyone born from 1945 through 1965. Anyone with known risk factors for hepatitis C. Sexually transmitted infections (STIs) Get screened for STIs, including gonorrhea and chlamydia, if: You are sexually active and are younger than 56 years of age. You are older than 56 years of age and your health care provider tells you that you are at risk for this type of infection. Your sexual activity has changed since you were last screened, and you are at increased risk for chlamydia or gonorrhea. Ask your health care provider if you are at risk. Ask your health care provider about whether you are at high risk for HIV. Your health care provider may recommend a prescription medicine   to help prevent HIV infection. If you choose to take medicine to prevent HIV, you should first get tested for HIV. You should then be tested every 3 months for as long as you are taking the medicine. Pregnancy If you are about to stop having your period (premenopausal) and you may become pregnant, seek counseling before you get  pregnant. Take 400 to 800 micrograms (mcg) of folic acid every day if you become pregnant. Ask for birth control (contraception) if you want to prevent pregnancy. Osteoporosis and menopause Osteoporosis is a disease in which the bones lose minerals and strength with aging. This can result in bone fractures. If you are 65 years old or older, or if you are at risk for osteoporosis and fractures, ask your health care provider if you should: Be screened for bone loss. Take a calcium or vitamin D supplement to lower your risk of fractures. Be given hormone replacement therapy (HRT) to treat symptoms of menopause. Follow these instructions at home: Lifestyle Do not use any products that contain nicotine or tobacco, such as cigarettes, e-cigarettes, and chewing tobacco. If you need help quitting, ask your health care provider. Do not use street drugs. Do not share needles. Ask your health care provider for help if you need support or information about quitting drugs. Alcohol use Do not drink alcohol if: Your health care provider tells you not to drink. You are pregnant, may be pregnant, or are planning to become pregnant. If you drink alcohol: Limit how much you use to 0-1 drink a day. Limit intake if you are breastfeeding. Be aware of how much alcohol is in your drink. In the [place] one drink equals one 12 oz bottle of beer (355 mL), one 5 oz glass of wine (148 mL), or one 1 oz glass of hard liquor (44 mL). General instructions Schedule regular health, dental, and eye exams. Stay current with your vaccines. Tell your health care provider if: You often feel depressed. You have ever been abused or do not feel safe at home. Summary Adopting a healthy lifestyle and getting preventive care are important in promoting health and wellness. Follow your health care provider's instructions about healthy diet, exercising, and getting tested or screened for diseases. Follow your health care provider's  instructions on monitoring your cholesterol and blood pressure. This information is not intended to replace advice given to you by your health care provider. Make sure you discuss any questions you have with your health care provider. Document Revised: 05/11/2020 Document Reviewed: 02/24/2018 Elsevier Patient Education  2022 Elsevier Inc.  

## 2021-01-14 NOTE — Progress Notes (Signed)
Patient scheduled for a mammogram at the [facility] on 01/17/21 at 1250 PM. Patient notified.   Paulina Fusi, RN

## 2021-01-14 NOTE — Progress Notes (Signed)
Patient ID: Natalie Edwards, female   DOB: Jul 30, 1965, 55 y.o.   MRN: [id number]  Natalie Edwards is a 55 y.o. [phone] female here for a routine annual gynecologic exam.  Current complaints: some vaginal itching.   Denies abnormal vaginal bleeding, discharge, pelvic pain, problems with intercourse or other gynecologic concerns.    Gynecologic History No LMP recorded. Patient has had a hysterectomy. Contraception: post menopausal status Last Pap: unknown. Results were: normal Last mammogram: unknown. Results were: normal  Obstetric History OB History  Gravida Para Term Preterm AB Living  7 3 3   4     SAB IAB Ectopic Multiple Live Births    4          # Outcome Date GA Lbr Len/2nd Weight Sex Delivery Anes PTL Lv  7 Term      Vag-Spont     6 IAB           5 Term      Vag-Spont     4 IAB           3 IAB           2 Term      Vag-Spont     1 IAB             Past Medical History:  Diagnosis Date   Allergy    Anemia    Diverticulosis    Gall bladder stones 1993   Gastric ulcer    GERD (gastroesophageal reflux disease)    Hypertension    Renal mass     Past Surgical History:  Procedure Laterality Date   ABDOMINAL HYSTERECTOMY     CHOLECYSTECTOMY      Current Outpatient Medications on File Prior to Visit  Medication Sig Dispense Refill   amLODipine-benazepril (LOTREL) 10-40 MG capsule TAKE 1 CAPSULE BY MOUTH DAILY 90 capsule 3   esomeprazole (NEXIUM) 40 MG capsule TAKE 1 CAPSULE BY MOUTH 2 TIMES DAILY 180 capsule 3   hydrochlorothiazide (HYDRODIURIL) 25 MG tablet TAKE 1 TABLET BY MOUTH DAILY 90 tablet 3   loratadine (CLARITIN) 10 MG tablet Take 1 tablet (10 mg total) by mouth daily. 90 tablet 1   mirtazapine (REMERON) 15 MG tablet TAKE 1 (ONE) TABLET BY MOUTH AT BEDTIME 90 tablet 2   Suvorexant 20 MG TABS Take 1 tablet by mouth at bedtime. 30 tablet 3   temazepam (RESTORIL) 30 MG capsule Take 1 capsule by mouth at bedtime as needed for sleep. 30 capsule 1   fluticasone (FLONASE) 50  MCG/ACT nasal spray Place 2 sprays into both nostrils daily. (Patient not taking: Reported on 01/14/2021) 16 g 6   No current facility-administered medications on file prior to visit.    Allergies  Allergen Reactions   Ambien [Zolpidem Tartrate]     Jitteriness, nervousness, abdominal pain   Hydroxyzine     Palpitations and jitteriness     Social History   Socioeconomic History   Marital status: Single    Spouse name: Not on file   Number of children: 3   Years of education: Not on file   Highest education level: High school graduate  Occupational History   Not on file  Tobacco Use   Smoking status: Every Day    Packs/day: 0.25    Years: 25.00    Pack years: 6.25    Types: Cigarettes   Smokeless tobacco: Never   Tobacco comments:    Pt tried to quit - helps with anxiety   Vaping  Use   Vaping Use: Never used  Substance and Sexual Activity   Alcohol use: Yes   Drug use: No   Sexual activity: Yes    Comment: hysterectomy  Other Topics Concern   Not on file  Social History Narrative   Lives at home in an apartment. Her mother lives with her.    Right handed   Caffeine: drinks approx. 36 oz of pepsi per day. Sometimes drinks 2 cups of coffee in a day as well.    Social Determinants of Health   Financial Resource Strain: Not on file  Food Insecurity: Not on file  Transportation Needs: Not on file  Physical Activity: Not on file  Stress: Not on file  Social Connections: Not on file  Intimate Partner Violence: Not on file    Family History  Problem Relation Age of Onset   Cancer Maternal Uncle        Lung   Cancer Maternal Uncle        Lung   Cancer Maternal Uncle        Lung   Headache Neg Hx        "I don't think so"   Migraines Neg Hx        "I don't think so"    The following portions of the patient's history were reviewed and updated as appropriate: allergies, current medications, past family history, past medical history, past social history, past  surgical history and problem list.  Review of Systems Pertinent items noted in HPI and remainder of comprehensive ROS otherwise negative.   Objective:  BP 124/84   Pulse 98   Ht 5\' 3"  (1.6 m)   Wt 176 lb 11.2 oz (80.2 kg)   BMI 31.30 kg/m  CONSTITUTIONAL: Well-developed, well-nourished female in no acute distress.  HENT:  Normocephalic, atraumatic, External right and left ear normal. Oropharynx is clear and moist EYES: Conjunctivae and EOM are normal. Pupils are equal, round, and reactive to light. No scleral icterus.  NECK: Normal range of motion, supple, no masses.  Normal thyroid.  SKIN: Skin is warm and dry. No rash noted. Not diaphoretic. No erythema. No pallor. [place]: Alert and oriented to person, place, and time. Normal reflexes, muscle tone coordination. No cranial nerve deficit noted. PSYCHIATRIC: Normal mood and affect. Normal behavior. Normal judgment and thought content. CARDIOVASCULAR: Normal heart rate noted, regular rhythm RESPIRATORY: Clear to auscultation bilaterally. Effort and breath sounds normal, no problems with respiration noted. BREASTS: Symmetric in size. No masses, skin changes, nipple drainage, or lymphadenopathy. ABDOMEN: Soft, normal bowel sounds, no distention noted.  No tenderness, rebound or guarding.  PELVIC: Normal appearing external genitalia; atrophic appearing vaginal mucosa and cervix.  No abnormal discharge noted.  Pap smear obtained.  Normal uterine size, no other palpable masses, no uterine or adnexal tenderness. MUSCULOSKELETAL: Normal range of motion. No tenderness.  No cyanosis, clubbing, or edema.  2+ distal pulses.   Assessment:  Annual gynecologic examination with pap smear Screening Mammogram  STD testing Plan:  Will follow up results of pap smear and manage accordingly. Mammogram scheduled. STD testing as per pt request. Flu vaccine UTD Routine preventative health maintenance measures emphasized. Please refer to After Visit  Summary for other counseling recommendations.    Chancy Milroy, MD, [place] Attending [place] for [facility] [place], [place]

## 2021-01-15 LAB — CYTOLOGY - PAP
Chlamydia: NEGATIVE
Comment: NEGATIVE
Comment: NEGATIVE
Comment: NEGATIVE
Comment: NORMAL
Diagnosis: NEGATIVE
High risk HPV: NEGATIVE
Neisseria Gonorrhea: NEGATIVE
Trichomonas: NEGATIVE

## 2021-01-15 LAB — HEPATITIS B SURFACE ANTIGEN: Hepatitis B Surface Ag: NEGATIVE

## 2021-01-15 LAB — RPR: RPR Ser Ql: NONREACTIVE

## 2021-01-15 LAB — HIV ANTIBODY (ROUTINE TESTING W REFLEX): HIV Screen 4th Generation wRfx: NONREACTIVE

## 2021-01-15 LAB — HEPATITIS C ANTIBODY: Hep C Virus Ab: <0.1 s/co ratio (ref 0.0–0.9)

## 2021-01-17 ENCOUNTER — Other Ambulatory Visit: Payer: Self-pay | Admitting: Pulmonary Disease

## 2021-01-17 ENCOUNTER — Ambulatory Visit: Payer: 59

## 2021-01-18 ENCOUNTER — Other Ambulatory Visit (HOSPITAL_COMMUNITY): Payer: Self-pay

## 2021-01-18 MED ORDER — SUVOREXANT 20 MG PO TABS
20.0000 mg | ORAL_TABLET | Freq: Every evening | ORAL | 1 refills | Status: DC
  Filled 2021-01-18 – 2021-01-22 (×2): qty 30, 30d supply, fill #0

## 2021-01-18 MED ORDER — TEMAZEPAM 30 MG PO CAPS
30.0000 mg | ORAL_CAPSULE | Freq: Every evening | ORAL | 1 refills | Status: DC | PRN
  Filled 2021-01-18: qty 30, 30d supply, fill #0

## 2021-01-18 NOTE — Telephone Encounter (Signed)
meds refilled 

## 2021-01-18 NOTE — Telephone Encounter (Signed)
AO please advise on the refills of the 2 meds that are pended for you to sign.  She stated that she does not have enough to last till her appt with you on 12/09.  Thanks

## 2021-01-21 ENCOUNTER — Other Ambulatory Visit (HOSPITAL_COMMUNITY): Payer: Self-pay

## 2021-01-22 ENCOUNTER — Other Ambulatory Visit (HOSPITAL_COMMUNITY): Payer: Self-pay

## 2021-01-22 ENCOUNTER — Telehealth: Payer: Self-pay | Admitting: Pulmonary Disease

## 2021-01-22 MED ORDER — TEMAZEPAM 30 MG PO CAPS: 30.0000 mg | ORAL_CAPSULE | Freq: Every evening | ORAL | 1 refills | Status: DC | PRN

## 2021-01-22 MED ORDER — SUVOREXANT 20 MG PO TABS: 20.0000 mg | ORAL_TABLET | Freq: Every evening | ORAL | 1 refills | Status: DC

## 2021-01-22 NOTE — Telephone Encounter (Signed)
AO please advise on the refill of the pended medications.    Pt was last seen on 11/28/20 by you and will come back to see you on 02/22/21.  thanks

## 2021-01-23 ENCOUNTER — Encounter: Payer: Self-pay | Admitting: Emergency Medicine

## 2021-01-23 ENCOUNTER — Other Ambulatory Visit: Payer: Self-pay

## 2021-01-23 ENCOUNTER — Ambulatory Visit
Admission: EM | Admit: 2021-01-23 | Discharge: 2021-01-23 | Disposition: A | Discharge status: Home / Self Care | Payer: 59 | Attending: Internal Medicine | Admitting: Internal Medicine

## 2021-01-23 DIAGNOSIS — R6889 Other general symptoms and signs: Secondary | ICD-10-CM | POA: Diagnosis not present

## 2021-01-23 DIAGNOSIS — R112 Nausea with vomiting, unspecified: Secondary | ICD-10-CM

## 2021-01-23 DIAGNOSIS — R051 Acute cough: Secondary | ICD-10-CM | POA: Diagnosis not present

## 2021-01-23 LAB — POCT INFLUENZA A/B
Influenza A, POC: NEGATIVE
Influenza B, POC: NEGATIVE

## 2021-01-23 MED ORDER — BENZONATATE 100 MG PO CAPS: 100.0000 mg | ORAL_CAPSULE | Freq: Three times a day (TID) | ORAL | 0 refills | Status: DC | PRN

## 2021-01-23 MED ORDER — ONDANSETRON HCL 4 MG/2ML IJ SOLN
4.0000 mg | Freq: Once | INTRAMUSCULAR | Status: AC
  Administered 2021-01-23: 4 mg via INTRAMUSCULAR

## 2021-01-23 NOTE — Discharge Instructions (Signed)
It appears that you have a viral infection that is causing your symptoms.  Your rapid flu test was negative.  COVID-19 viral swab is pending.  Please increase clear oral fluid intake to prevent dehydration.  Go to the hospital if symptoms worsen.

## 2021-01-23 NOTE — ED Provider Notes (Signed)
[facility]    CSN: [id number] Arrival date & time: 01/23/21  0803      History   Chief Complaint Chief Complaint  Patient presents with   Headache   Emesis    HPI Natalie Edwards is a 55 y.o. female.   Patient presents with headache, nausea, vomiting, cough, body aches, chills that have been present since yesterday.  Cough is productive with yellow sputum per patient.  Patient denies any chest pain or shortness of breath.  Patient has not yet taken any medications to help alleviate symptoms.  Patient has not been able to keep any food or fluids down since symptoms started.  Has had some mild diarrhea.  Denies any blood in stool or emesis.  Patient reports that she has been exposed to people who have tested positive for the flu.   Headache Emesis  Past Medical History:  Diagnosis Date   Allergy    Anemia    Diverticulosis    Gall bladder stones 1993   Gastric ulcer    GERD (gastroesophageal reflux disease)    Hypertension    Renal mass     Patient Active Problem List   Diagnosis Date Noted   Visit for routine gyn exam 01/14/2021   Screening mammogram, encounter for 01/14/2021   Possible exposure to STD 01/14/2021   Chronic insomnia 10/18/2019   CKD (chronic kidney disease) 04/07/2019   GAD (generalized anxiety disorder) 05/07/2018   Chronic gastric ulcer 05/07/2018   Essential hypertension 05/07/2018   Iron deficiency anemia due to chronic blood loss 09/23/2016   Peptic ulcer disease with hemorrhage 09/23/2016   Thrombocytosis 09/09/2016   Leukocytosis 09/09/2016   H/O ulcer disease 07/31/2016    Past Surgical History:  Procedure Laterality Date   ABDOMINAL HYSTERECTOMY     CHOLECYSTECTOMY      OB History     Gravida  7   Para  3   Term  3   Preterm      AB  4   Living         SAB      IAB  4   Ectopic      Multiple      Live Births               Home Medications    Prior to Admission medications   Medication  Sig Start Date End Date Taking? Authorizing Provider  benzonatate (TESSALON) 100 MG capsule Take 1 capsule (100 mg total) by mouth every 8 (eight) hours as needed for cough. 01/23/21  Yes , Hildred Alamin E, FNP  amLODipine-benazepril (LOTREL) 10-40 MG capsule TAKE 1 CAPSULE BY MOUTH DAILY 05/21/20 05/21/21  Sandi Mariscal, MD  esomeprazole (NEXIUM) 40 MG capsule TAKE 1 CAPSULE BY MOUTH 2 TIMES DAILY 05/21/20 05/21/21  Sandi Mariscal, MD  fluticasone (FLONASE) 50 MCG/ACT nasal spray Place 2 sprays into both nostrils daily. Patient not taking: Reported on 01/14/2021 12/06/18   Antony Blackbird, MD  hydrochlorothiazide (HYDRODIURIL) 25 MG tablet TAKE 1 TABLET BY MOUTH DAILY 05/21/20 05/21/21  Sandi Mariscal, MD  loratadine (CLARITIN) 10 MG tablet Take 1 tablet (10 mg total) by mouth daily. 08/18/19   Nicolette Bang, MD  mirtazapine (REMERON) 15 MG tablet TAKE 1 (ONE) TABLET BY MOUTH AT BEDTIME 05/21/20 05/21/21  Sandi Mariscal, MD  Suvorexant 20 MG TABS Take 1 tablet by mouth at bedtime. 01/22/21   Olalere, Cicero Duck A, MD  temazepam (RESTORIL) 30 MG capsule Take 1 capsule by mouth at  bedtime as needed for sleep. 01/22/21   Laurin Coder, MD    Family History Family History  Problem Relation Age of Onset   Cancer Maternal Uncle        Lung   Cancer Maternal Uncle        Lung   Cancer Maternal Uncle        Lung   Headache Neg Hx        "I don't think so"   Migraines Neg Hx        "I don't think so"    Social History Social History   Tobacco Use   Smoking status: Every Day    Packs/day: 0.25    Years: 25.00    Pack years: 6.25    Types: Cigarettes   Smokeless tobacco: Never   Tobacco comments:    Pt tried to quit - helps with anxiety   Vaping Use   Vaping Use: Never used  Substance Use Topics   Alcohol use: Yes   Drug use: No     Allergies   Ambien [zolpidem tartrate] and Hydroxyzine   Review of Systems Review of Systems Per HPI  Physical Exam Triage Vital Signs ED Triage Vitals  Enc Vitals Group      BP 01/23/21 0811 119/70     Pulse Rate 01/23/21 0811 (!) 117     Resp 01/23/21 0811 16     Temp 01/23/21 0811 98.1 F (36.7 C)     Temp Source 01/23/21 0811 Oral     SpO2 01/23/21 0811 96 %     Weight --      Height --      Head Circumference --      Peak Flow --      Pain Score 01/23/21 0812 10     Pain Loc --      Pain Edu? --      Excl. in [address]? --    No data found.  Updated Vital Signs BP 119/70 (BP Location: Left Arm)   Pulse (!) 117   Temp 98.1 F (36.7 C) (Oral)   Resp 16   SpO2 96%   Visual Acuity Right Eye Distance:   Left Eye Distance:   Bilateral Distance:    Right Eye Near:   Left Eye Near:    Bilateral Near:     Physical Exam Constitutional:      General: She is not in acute distress.    Appearance: Normal appearance. She is not toxic-appearing or diaphoretic.  HENT:     Head: Normocephalic and atraumatic.     Right Ear: Tympanic membrane and ear canal normal.     Left Ear: Tympanic membrane and ear canal normal.     Nose: Congestion present.     Mouth/Throat:     Mouth: Mucous membranes are moist.     Pharynx: No posterior oropharyngeal erythema.  Eyes:     Extraocular Movements: Extraocular movements intact.     Conjunctiva/sclera: Conjunctivae normal.     Pupils: Pupils are equal, round, and reactive to light.  Cardiovascular:     Rate and Rhythm: Normal rate and regular rhythm.     Pulses: Normal pulses.     Heart sounds: Normal heart sounds.  Pulmonary:     Effort: Pulmonary effort is normal. No respiratory distress.     Breath sounds: Normal breath sounds. No wheezing.  Abdominal:     General: Abdomen is flat. Bowel sounds are normal.  Palpations: Abdomen is soft.  Musculoskeletal:        General: Normal range of motion.     Cervical back: Normal range of motion.  Skin:    General: Skin is warm and dry.  Neurological:     General: No focal deficit present.     Mental Status: She is alert and oriented to person, place, and time.  Mental status is at baseline.  Psychiatric:        Mood and Affect: Mood normal.        Behavior: Behavior normal.     UC Treatments / Results  Labs (all labs ordered are listed, but only abnormal results are displayed) Labs Reviewed  NOVEL CORONAVIRUS, NAA  POCT INFLUENZA A/B    EKG   Radiology No results found.  Procedures Procedures (including critical care time)  Medications Ordered in UC Medications  ondansetron (ZOFRAN) injection 4 mg (4 mg Intramuscular Given 01/23/21 0835)    Initial Impression / Assessment and Plan / UC Course  I have reviewed the triage vital signs and the nursing notes.  Pertinent labs & imaging results that were available during my care of the patient were reviewed by me and considered in my medical decision making (see chart for details).     Patient presents with symptoms likely from a viral upper respiratory infection. Differential includes bacterial pneumonia, sinusitis, allergic rhinitis, Covid 19, flu. Do not suspect underlying cardiopulmonary process. Symptoms seem unlikely related to ACS, CHF or COPD exacerbations, pneumonia, pneumothorax. Patient is nontoxic appearing and not in need of emergent medical intervention. Rapid flu was negative. Covid 19 PCR pending.   Recommended symptom control with over the counter medications.Ondansetron was administered in urgent care today and patient reports relief of nausea with this. Unable to prescribe nausea regimen for home with phenergan or ondansetron due to interactions with patient's daily medications. Will prescribe benzonatate to take as needed for cough.   Return if symptoms fail to improve in 1-2 weeks or you develop shortness of breath, chest pain, severe headache. Patient states understanding and is agreeable. Advised patient to go to the hospital if unable to keep fluids down in the next 24 hours. No signs of dehydration on exam at this time.   Discharged with PCP followup.  Final  Clinical Impressions(s) / UC Diagnoses   Final diagnoses:  Acute cough  Flu-like symptoms  Nausea and vomiting, unspecified vomiting type     Discharge Instructions      It appears that you have a viral infection that is causing your symptoms.  Your rapid flu test was negative.  COVID-19 viral swab is pending.  Please increase clear oral fluid intake to prevent dehydration.  Go to the hospital if symptoms worsen.     ED Prescriptions     Medication Sig Dispense Auth. Provider   benzonatate (TESSALON) 100 MG capsule Take 1 capsule (100 mg total) by mouth every 8 (eight) hours as needed for cough. 21 capsule [place], Michele Rockers, [place]      PDMP not reviewed this encounter.   Teodora Medici, [place] 01/23/21 [phone]

## 2021-01-23 NOTE — ED Triage Notes (Addendum)
Headache, nausea, vomiting, coughing, chills, starting last night. Has been exposed to people with the flu. Has received flu vaccine. Denies taking any medications prior to coming today, fever, hx of stroke/CVA, heart problems. Denies hx of previous headache, head injury

## 2021-01-24 LAB — NOVEL CORONAVIRUS, NAA

## 2021-02-01 ENCOUNTER — Other Ambulatory Visit (HOSPITAL_COMMUNITY): Payer: Self-pay

## 2021-02-05 NOTE — Telephone Encounter (Signed)
Pt's temazepam was refilled 11/8. Closing encounter.

## 2021-02-06 ENCOUNTER — Other Ambulatory Visit (HOSPITAL_COMMUNITY): Payer: Self-pay

## 2021-02-06 MED FILL — Esomeprazole Magnesium Cap Delayed Release 40 MG (Base Eq): ORAL | 90 days supply | Qty: 180 | Fill #2 | Status: AC

## 2021-02-06 MED FILL — Amlodipine Besylate-Benazepril HCl Cap 10-40 MG: ORAL | 87 days supply | Qty: 87 | Fill #2 | Status: AC

## 2021-02-06 MED FILL — Amlodipine Besylate-Benazepril HCl Cap 10-40 MG: ORAL | 3 days supply | Qty: 3 | Fill #2 | Status: AC

## 2021-02-06 MED FILL — Hydrochlorothiazide Tab 25 MG: ORAL | 90 days supply | Qty: 90 | Fill #2 | Status: AC

## 2021-02-08 ENCOUNTER — Other Ambulatory Visit (HOSPITAL_COMMUNITY): Payer: Self-pay

## 2021-02-14 ENCOUNTER — Other Ambulatory Visit (HOSPITAL_COMMUNITY): Payer: Self-pay

## 2021-02-14 MED ORDER — MIRTAZAPINE 15 MG PO TABS
15.0000 mg | ORAL_TABLET | Freq: Every day | ORAL | 2 refills | Status: DC
  Filled 2021-02-14: qty 90, 90d supply, fill #0
  Filled 2021-05-10: qty 90, 90d supply, fill #1

## 2021-02-21 DIAGNOSIS — Z20822 Contact with and (suspected) exposure to covid-19: Secondary | ICD-10-CM | POA: Diagnosis not present

## 2021-02-22 ENCOUNTER — Ambulatory Visit (INDEPENDENT_AMBULATORY_CARE_PROVIDER_SITE_OTHER): Payer: 59 | Admitting: Pulmonary Disease

## 2021-02-22 ENCOUNTER — Other Ambulatory Visit: Payer: Self-pay

## 2021-02-22 ENCOUNTER — Encounter: Payer: Self-pay | Admitting: Pulmonary Disease

## 2021-02-22 VITALS — BP 120/80 | HR 83 | Temp 98.7°F | Ht 63.0 in | Wt 181.0 lb

## 2021-02-22 DIAGNOSIS — J440 Chronic obstructive pulmonary disease with acute lower respiratory infection: Secondary | ICD-10-CM

## 2021-02-22 DIAGNOSIS — G478 Other sleep disorders: Secondary | ICD-10-CM

## 2021-02-22 DIAGNOSIS — F5104 Psychophysiologic insomnia: Secondary | ICD-10-CM | POA: Diagnosis not present

## 2021-02-22 DIAGNOSIS — J209 Acute bronchitis, unspecified: Secondary | ICD-10-CM

## 2021-02-22 MED ORDER — SUVOREXANT 20 MG PO TABS: 20.0000 mg | ORAL_TABLET | Freq: Every evening | ORAL | 2 refills | Status: DC

## 2021-02-22 MED ORDER — AZITHROMYCIN 250 MG PO TABS: 250.0000 mg | ORAL_TABLET | Freq: Every day | ORAL | 0 refills | Status: DC

## 2021-02-22 MED ORDER — PREDNISONE 10 MG PO TABS: ORAL_TABLET | ORAL | 0 refills | Status: DC

## 2021-02-22 MED ORDER — TEMAZEPAM 30 MG PO CAPS
30.0000 mg | ORAL_CAPSULE | Freq: Every evening | ORAL | 2 refills | Status: DC | PRN
Start: 2021-02-22 — End: 2021-06-21

## 2021-02-22 NOTE — Progress Notes (Signed)
Subjective:    Patient ID: Natalie Edwards, female    DOB: Apr 02, 1965, 55 y.o.   MRN: [id number]  Patient with a history of chronic insomnia  Evaluated in the past about 5 years ago  Restoril has been helping-uses 30 mg nightly -Runs out of medications  She is doing the third shift at present There has been no change in schedule, this has helped her sleep a little bit Still with some difficulty falling asleep and maintaining sleep  Medication appears to be working well  She does have a history of snoring, no witnessed apneas She is only sleepy during the day when she does not get adequate rest at night Occasional dryness of her mouth in the mornings She does have headaches  No family history of sleep disordered breathing  She is an active smoker, a pack of cigarettes lasts her about 3 days Continues to try to quit, not able to at present  Has a lot of stressors  Increasing cough in the last few days, minimal sputum production Slightly under the weather  Continues to smoke actively Past Medical History:  Diagnosis Date   Allergy    Anemia    Diverticulosis    Gall bladder stones 1993   Gastric ulcer    GERD (gastroesophageal reflux disease)    Hypertension    Renal mass    Social History   Socioeconomic History   Marital status: Single    Spouse name: Not on file   Number of children: 3   Years of education: Not on file   Highest education level: High school graduate  Occupational History   Not on file  Tobacco Use   Smoking status: Every Day    Packs/day: 0.25    Years: 25.00    Pack years: 6.25    Types: Cigarettes   Smokeless tobacco: Never   Tobacco comments:    Pt tried to quit - helps with anxiety   Vaping Use   Vaping Use: Never used  Substance and Sexual Activity   Alcohol use: Yes   Drug use: No   Sexual activity: Yes    Comment: hysterectomy  Other Topics Concern   Not on file  Social History Narrative   Lives at home in an apartment. Her  mother lives with her.    Right handed   Caffeine: drinks approx. 36 oz of pepsi per day. Sometimes drinks 2 cups of coffee in a day as well.    Social Determinants of Health   Financial Resource Strain: Not on file  Food Insecurity: Not on file  Transportation Needs: Not on file  Physical Activity: Not on file  Stress: Not on file  Social Connections: Not on file  Intimate Partner Violence: Not on file   Family History  Problem Relation Age of Onset   Cancer Maternal Uncle        Lung   Cancer Maternal Uncle        Lung   Cancer Maternal Uncle        Lung   Headache Neg Hx        "I don't think so"   Migraines Neg Hx        "I don't think so"   Review of Systems  Constitutional:  Negative for fever and unexpected weight change.  HENT:  Negative for congestion, dental problem, ear pain, nosebleeds, postnasal drip, rhinorrhea, sinus pressure, sneezing, sore throat and trouble swallowing.   Eyes:  Negative for redness and  itching.  Respiratory: Negative.    Cardiovascular: Negative.   Gastrointestinal: Negative.   Genitourinary:  Negative for dysuria.  Musculoskeletal:  Negative for joint swelling.  Skin:  Negative for rash.  Allergic/Immunologic: Negative.  Negative for environmental allergies, food allergies and immunocompromised state.  Neurological:  Negative for headaches.  Hematological:  Does not bruise/bleed easily.  Psychiatric/Behavioral:  Negative for dysphoric mood. The patient is nervous/anxious.      Objective:   Physical Exam Constitutional:      Appearance: Normal appearance.  HENT:     Head: Normocephalic.     Mouth/Throat:     Comments: Mallampati 1, Eyes:     General:        Right eye: No discharge.        Left eye: No discharge.  Cardiovascular:     Rate and Rhythm: Normal rate and regular rhythm.     Pulses: Normal pulses.     Heart sounds: No murmur heard.   No friction rub.  Pulmonary:     Effort: Pulmonary effort is normal. No  respiratory distress.     Breath sounds: No stridor. No wheezing or rhonchi.  Musculoskeletal:     Cervical back: No rigidity or tenderness.  Neurological:     Mental Status: She is alert.  Psychiatric:        Mood and Affect: Mood normal.   Vitals:   02/22/21 1034  BP: 120/80  Pulse: 83  Temp: 98.7 F (37.1 C)  SpO2: 98%       Assessment & Plan:  Sleep onset and sleep maintenance insomnia -Continue with Restoril -To use as needed Belsomra  History of depression and anxiety -Continue to optimize treatment  Bronchitis -We will call in a prescription for azithromycin and prednisone  Nicotine addiction -She continues to work on quitting smoking  Plan: Continue Restoril 30 mg Belsomra may be used in place of Restoril  Smoking cessation encouraged  Stimulus control as able  Follow-up in about 6 months  Encouraged to call with any significant concerns  Azithromycin  prednisone 20 daily for 7 days called in

## 2021-02-22 NOTE — Patient Instructions (Signed)
I will see you back in about 6 months  Prescription for antibiotic and steroids will be sent in  Will send in refills for your sleep medication

## 2021-05-04 DIAGNOSIS — Z20822 Contact with and (suspected) exposure to covid-19: Secondary | ICD-10-CM | POA: Diagnosis not present

## 2021-05-06 ENCOUNTER — Other Ambulatory Visit (HOSPITAL_COMMUNITY): Payer: Self-pay

## 2021-05-10 ENCOUNTER — Encounter: Payer: Self-pay | Admitting: Internal Medicine

## 2021-05-10 ENCOUNTER — Other Ambulatory Visit (HOSPITAL_COMMUNITY): Payer: Self-pay

## 2021-05-14 ENCOUNTER — Encounter: Payer: Self-pay | Admitting: Nurse Practitioner

## 2021-05-14 ENCOUNTER — Encounter: Payer: Self-pay | Admitting: Internal Medicine

## 2021-05-14 ENCOUNTER — Ambulatory Visit (INDEPENDENT_AMBULATORY_CARE_PROVIDER_SITE_OTHER): Payer: Medicare Other | Admitting: Nurse Practitioner

## 2021-05-14 ENCOUNTER — Other Ambulatory Visit (HOSPITAL_COMMUNITY): Payer: Self-pay

## 2021-05-14 ENCOUNTER — Other Ambulatory Visit: Payer: Self-pay

## 2021-05-14 ENCOUNTER — Telehealth: Payer: Self-pay

## 2021-05-14 VITALS — BP 106/74 | HR 86 | Resp 18 | Ht 63.0 in | Wt 179.0 lb

## 2021-05-14 DIAGNOSIS — I10 Essential (primary) hypertension: Secondary | ICD-10-CM

## 2021-05-14 DIAGNOSIS — J069 Acute upper respiratory infection, unspecified: Secondary | ICD-10-CM | POA: Diagnosis not present

## 2021-05-14 DIAGNOSIS — K219 Gastro-esophageal reflux disease without esophagitis: Secondary | ICD-10-CM | POA: Diagnosis not present

## 2021-05-14 DIAGNOSIS — R0689 Other abnormalities of breathing: Secondary | ICD-10-CM

## 2021-05-14 DIAGNOSIS — F419 Anxiety disorder, unspecified: Secondary | ICD-10-CM | POA: Diagnosis not present

## 2021-05-14 DIAGNOSIS — F32A Depression, unspecified: Secondary | ICD-10-CM | POA: Insufficient documentation

## 2021-05-14 MED ORDER — AMLODIPINE BESY-BENAZEPRIL HCL 10-40 MG PO CAPS
1.0000 | ORAL_CAPSULE | Freq: Every day | ORAL | 3 refills | Status: DC
  Filled 2021-05-14: qty 90, 90d supply, fill #0

## 2021-05-14 MED ORDER — ESOMEPRAZOLE MAGNESIUM 40 MG PO CPDR
DELAYED_RELEASE_CAPSULE | Freq: Two times a day (BID) | ORAL | 3 refills | Status: DC
  Filled 2021-05-14 – 2021-06-08 (×2): qty 180, 90d supply, fill #0

## 2021-05-14 NOTE — Progress Notes (Signed)
@Patient  ID: Natalie Edwards, female    DOB: Jun 18, 1965, 56 y.o.   MRN: [id number]  Chief Complaint  Patient presents with   Medication Refill    Referring provider: No ref. provider found   HPI  Patient presents today for medication refills.  She states that she needs Lotrel and Nexium refilled today.  Patient has been tolerating meds well.  Patient also complains today of feeling like she has a cold coming on.  Upon arrival to exam room patient's oxygen was low at 93%.  When rechecked it came up to 96%.  We will check respiratory panel today. Denies f/c/s, n/v/d, hemoptysis, PND, chest pain or edema.  Patient is also requesting a referral to psychiatry for anxiety and depression.  Patient denies any suicidal homicidal thoughts.  We discussed that we will place a referral to psychiatry and get her set up with a appointment here for counseling in the meantime.    Allergies  Allergen Reactions   Hydroxyzine Other (See Comments)    Palpitations and jitteriness    Ambien [Zolpidem Tartrate] Other (See Comments)    Jitteriness, nervousness, abdominal pain    Immunization History  Administered Date(s) Administered   PFIZER(Purple Top)SARS-COV-2 Vaccination 06/20/2019, 07/12/2019   Tdap 03/17/2014    Past Medical History:  Diagnosis Date   Allergy    Anemia    Diverticulosis    Gall bladder stones 1993   Gastric ulcer    GERD (gastroesophageal reflux disease)    Hypertension    Renal mass     Tobacco History: Social History   Tobacco Use  Smoking Status Every Day   Packs/day: 0.25   Years: 25.00   Pack years: 6.25   Types: Cigarettes  Smokeless Tobacco Never  Tobacco Comments   Pt tried to quit - helps with anxiety    Ready to quit: No Counseling given: Yes Tobacco comments: Pt tried to quit - helps with anxiety    Outpatient Encounter Medications as of 05/14/2021  Medication Sig   hydrochlorothiazide (HYDRODIURIL) 25 MG tablet TAKE 1 TABLET BY MOUTH DAILY    loratadine (CLARITIN) 10 MG tablet Take 1 tablet (10 mg total) by mouth daily.   mirtazapine (REMERON) 15 MG tablet Take 1 tablet by mouth at bedtime   [DISCONTINUED] amLODipine-benazepril (LOTREL) 10-40 MG capsule TAKE 1 CAPSULE BY MOUTH DAILY   [DISCONTINUED] esomeprazole (NEXIUM) 40 MG capsule TAKE 1 CAPSULE BY MOUTH 2 TIMES DAILY   amLODipine-benazepril (LOTREL) 10-40 MG capsule TAKE 1 CAPSULE BY MOUTH DAILY   azithromycin (ZITHROMAX) 250 MG tablet Take 1 tablet (250 mg total) by mouth daily. Take 2 tablets on day 1 and then 1 tablet daily for 4 days (Patient not taking: Reported on 05/14/2021)   esomeprazole (NEXIUM) 40 MG capsule TAKE 1 CAPSULE BY MOUTH 2 TIMES DAILY   predniSONE (DELTASONE) 10 MG tablet 20 mg daily for 7 days (Patient not taking: Reported on 05/14/2021)   Suvorexant 20 MG TABS Take 1 tablet by mouth at bedtime. (Patient not taking: Reported on 05/14/2021)   temazepam (RESTORIL) 30 MG capsule Take 1 capsule by mouth at bedtime as needed for sleep. (Patient not taking: Reported on 05/14/2021)   No facility-administered encounter medications on file as of 05/14/2021.     Review of Systems  Review of Systems  Constitutional: Negative.   HENT:  Positive for congestion.   Respiratory:  Positive for cough. Negative for shortness of breath.   Cardiovascular: Negative.   Gastrointestinal: Negative.   Allergic/Immunologic: Negative.  Neurological: Negative.   Psychiatric/Behavioral: Negative.        Physical Exam  BP 106/74    Pulse 86    Resp 18    Ht 5\' 3"  (1.6 m)    Wt 179 lb (81.2 kg)    SpO2 90%    BMI 31.71 kg/m   Wt Readings from Last 5 Encounters:  05/14/21 179 lb (81.2 kg)  02/22/21 181 lb (82.1 kg)  01/14/21 176 lb 11.2 oz (80.2 kg)  11/28/20 177 lb (80.3 kg)  02/15/20 184 lb (83.5 kg)     Physical Exam Vitals and nursing note reviewed.  Constitutional:      General: She is not in acute distress.    Appearance: She is well-developed.   Cardiovascular:     Rate and Rhythm: Normal rate and regular rhythm.  Pulmonary:     Effort: Pulmonary effort is normal.     Breath sounds: Normal breath sounds.  Neurological:     Mental Status: She is alert and oriented to person, place, and time.     Lab Results:  CBC    Component Value Date/Time   WBC 17.8 (H) 05/03/2019 1511   RBC 4.30 05/03/2019 1511   HGB 12.3 05/03/2019 1511   HGB 12.3 03/08/2019 0738   HGB 11.7 12/06/2018 0914   HGB 11.4 (L) 09/09/2016 1329   HCT 37.9 05/03/2019 1511   HCT 36.6 12/06/2018 0914   HCT 35.6 09/09/2016 1329   PLT 533.0 (H) 05/03/2019 1511   PLT 493 (H) 03/08/2019 0738   PLT 463 (H) 12/06/2018 0914   MCV 88.1 05/03/2019 1511   MCV 88 12/06/2018 0914   MCV 80.5 09/09/2016 1329   MCH 28.8 03/08/2019 0738   MCHC 32.4 05/03/2019 1511   RDW 15.4 05/03/2019 1511   RDW 15.9 (H) 12/06/2018 0914   RDW 16.8 (H) 09/09/2016 1329   LYMPHSABS 5.8 (H) 05/03/2019 1511   LYMPHSABS 5.2 (H) 12/06/2018 0914   LYMPHSABS 5.9 (H) 09/09/2016 1329   MONOABS 1.3 (H) 05/03/2019 1511   MONOABS 1.0 (H) 09/09/2016 1329   EOSABS 0.2 05/03/2019 1511   EOSABS 0.1 12/06/2018 0914   BASOSABS 0.1 05/03/2019 1511   BASOSABS 0.1 12/06/2018 0914   BASOSABS 0.0 09/09/2016 1329    BMET    Component Value Date/Time   NA 138 05/03/2019 1511   NA 141 01/19/2019 0956   K 4.8 05/03/2019 1511   CL 107 05/03/2019 1511   CO2 26 05/03/2019 1511   GLUCOSE 101 (H) 05/03/2019 1511   BUN 26 (H) 05/03/2019 1511   BUN 22 01/19/2019 0956   CREATININE 1.25 (H) 05/03/2019 1511   CREATININE 1.37 (H) 03/08/2019 0738   CALCIUM 10.1 05/03/2019 1511   GFRNONAA 44 (L) 03/08/2019 0738   GFRAA 51 (L) 03/08/2019 0738    BNP No results found for: BNP  ProBNP No results found for: PROBNP  Imaging: No results found.   Assessment & Plan:   Upper respiratory tract infection Will refill medications  URI:  Will check respiratory panel  May take airborne  May start  OTC antihistamine  Anxiety and depression:  Will place referral to psychiatrist  Follow up:  Follow up if needed     Fenton Foy, NP 05/14/2021

## 2021-05-14 NOTE — Telephone Encounter (Signed)
[name] is requesting this patient be scheduled to see you within the next weeks for anxiety issues

## 2021-05-14 NOTE — Patient Instructions (Addendum)
Medication refills:  Will refill medications  URI:  Will check respiratory panel  May take airborne  May start OTC antihistamine  Anxiety and depression:  Will place referral to psychiatrist  Follow up:  Follow up if needed

## 2021-05-14 NOTE — Assessment & Plan Note (Signed)
Will refill medications  URI:  Will check respiratory panel  May take airborne  May start OTC antihistamine  Anxiety and depression:  Will place referral to psychiatrist  Follow up:  Follow up if needed

## 2021-05-15 ENCOUNTER — Other Ambulatory Visit (HOSPITAL_COMMUNITY): Payer: Self-pay

## 2021-05-15 LAB — COVID-19, FLU A+B AND RSV
Influenza A, NAA: NOT DETECTED
Influenza B, NAA: NOT DETECTED
RSV, NAA: NOT DETECTED
SARS-CoV-2, NAA: NOT DETECTED

## 2021-05-15 NOTE — Telephone Encounter (Signed)
Pt appt scheduled for 06/04/21 at 3:30pm. ?

## 2021-05-17 ENCOUNTER — Other Ambulatory Visit (HOSPITAL_COMMUNITY): Payer: Self-pay

## 2021-05-22 ENCOUNTER — Other Ambulatory Visit (HOSPITAL_COMMUNITY): Payer: Self-pay

## 2021-05-23 ENCOUNTER — Other Ambulatory Visit (HOSPITAL_COMMUNITY): Payer: Self-pay

## 2021-05-23 MED ORDER — HYDROCHLOROTHIAZIDE 25 MG PO TABS
25.0000 mg | ORAL_TABLET | Freq: Every day | ORAL | 3 refills | Status: DC
  Filled 2021-05-23 – 2021-06-08 (×2): qty 90, 90d supply, fill #0

## 2021-05-27 ENCOUNTER — Other Ambulatory Visit (HOSPITAL_COMMUNITY): Payer: Self-pay

## 2021-05-29 ENCOUNTER — Other Ambulatory Visit: Payer: Self-pay

## 2021-05-29 ENCOUNTER — Ambulatory Visit
Admission: EM | Admit: 2021-05-29 | Discharge: 2021-05-29 | Disposition: A | Discharge status: Home / Self Care | Payer: Medicare Other | Attending: Internal Medicine | Admitting: Internal Medicine

## 2021-05-29 ENCOUNTER — Encounter: Payer: Self-pay | Admitting: Emergency Medicine

## 2021-05-29 DIAGNOSIS — J069 Acute upper respiratory infection, unspecified: Secondary | ICD-10-CM | POA: Insufficient documentation

## 2021-05-29 DIAGNOSIS — J029 Acute pharyngitis, unspecified: Secondary | ICD-10-CM | POA: Diagnosis not present

## 2021-05-29 LAB — POCT RAPID STREP A (OFFICE): Rapid Strep A Screen: NEGATIVE

## 2021-05-29 MED ORDER — FLUTICASONE PROPIONATE 50 MCG/ACT NA SUSP: 1.0000 | Freq: Every day | NASAL | 0 refills | Status: DC

## 2021-05-29 MED ORDER — BENZONATATE 100 MG PO CAPS: 100.0000 mg | ORAL_CAPSULE | Freq: Three times a day (TID) | ORAL | 0 refills | Status: DC | PRN

## 2021-05-29 NOTE — ED Triage Notes (Signed)
Patient c/o sore throat, headache, congestion when awakening this morning.  Denies any OTC meds.  ?

## 2021-05-29 NOTE — Discharge Instructions (Signed)
Your rapid strep test was negative.  Throat culture, COVID-19, flu test is pending.  We will call if it is positive.  It appears that you have a viral upper respiratory infection that should self resolve in the next few days with symptomatic treatment.  You have been prescribed 2 medications to alleviate symptoms. ?

## 2021-05-29 NOTE — ED Provider Notes (Signed)
?[facility] ? ? ? ?CSN: [id number] ?Arrival date & time: 05/29/21  0803 ? ? ?  ? ?History   ?Chief Complaint ?Chief Complaint  ?Patient presents with  ? Sore Throat  ? ? ?HPI ?Natalie Edwards is a 56 y.o. female.  ? ?Patient presents with sore throat, headache, nasal congestion, cough that started upon awakening this morning.  Denies any known fevers.  She reports that she has been exposed to someone at her workplace with similar symptoms.  Denies chest pain, shortness of breath, nausea, vomiting, diarrhea, abdominal pain.  Patient has not taken any medications to alleviate symptoms. ? ? ?Sore Throat ? ? ?Past Medical History:  ?Diagnosis Date  ? Allergy   ? Anemia   ? Diverticulosis   ? Gall bladder stones 1993  ? Gastric ulcer   ? GERD (gastroesophageal reflux disease)   ? Hypertension   ? Renal mass   ? ? ?Patient Active Problem List  ? Diagnosis Date Noted  ? Anxiety and depression 05/14/2021  ? Breathing difficult 05/14/2021  ? Upper respiratory tract infection 05/14/2021  ? GERD (gastroesophageal reflux disease)   ? Visit for routine gyn exam 01/14/2021  ? Screening mammogram, encounter for 01/14/2021  ? Possible exposure to STD 01/14/2021  ? Chronic insomnia 10/18/2019  ? CKD (chronic kidney disease) 04/07/2019  ? GAD (generalized anxiety disorder) 05/07/2018  ? Chronic gastric ulcer 05/07/2018  ? Essential hypertension 05/07/2018  ? Iron deficiency anemia due to chronic blood loss 09/23/2016  ? Peptic ulcer disease with hemorrhage 09/23/2016  ? Thrombocytosis 09/09/2016  ? Leukocytosis 09/09/2016  ? H/O ulcer disease 07/31/2016  ? ? ?Past Surgical History:  ?Procedure Laterality Date  ? ABDOMINAL HYSTERECTOMY    ? CHOLECYSTECTOMY    ? ? ?OB History   ? ? Gravida  ?7  ? Para  ?3  ? Term  ?3  ? Preterm  ?   ? AB  ?4  ? Living  ?   ?  ? ? SAB  ?   ? IAB  ?4  ? Ectopic  ?   ? Multiple  ?   ? Live Births  ?   ?   ?  ?  ? ? ? ?Home Medications   ? ?Prior to Admission medications   ?Medication Sig Start  Date End Date Taking? Authorizing Provider  ?amLODipine-benazepril (LOTREL) 10-40 MG capsule TAKE 1 CAPSULE BY MOUTH DAILY 05/14/21 05/14/22 Yes Fenton Foy, NP  ?benzonatate (TESSALON) 100 MG capsule Take 1 capsule (100 mg total) by mouth every 8 (eight) hours as needed for cough. 05/29/21  Yes Teodora Medici, FNP  ?esomeprazole (NEXIUM) 40 MG capsule TAKE 1 CAPSULE BY MOUTH 2 TIMES DAILY 05/14/21 05/14/22 Yes Fenton Foy, NP  ?fluticasone (FLONASE) 50 MCG/ACT nasal spray Place 1 spray into both nostrils daily for 3 days. 05/29/21 06/01/21 Yes Teodora Medici, FNP  ?hydrochlorothiazide (HYDRODIURIL) 25 MG tablet Take 1 tablet by mouth daily. 05/23/21  Yes   ?loratadine (CLARITIN) 10 MG tablet Take 1 tablet (10 mg total) by mouth daily. 08/18/19  Yes Nicolette Bang, MD  ?mirtazapine (REMERON) 15 MG tablet Take 1 tablet by mouth at bedtime 02/14/21  Yes   ?azithromycin (ZITHROMAX) 250 MG tablet Take 1 tablet (250 mg total) by mouth daily. Take 2 tablets on day 1 and then 1 tablet daily for 4 days ?Patient not taking: Reported on 05/14/2021 02/22/21   Laurin Coder, MD  ?hydrochlorothiazide (HYDRODIURIL) 25 MG tablet  TAKE 1 TABLET BY MOUTH DAILY 05/21/20 05/21/21  Sandi Mariscal, MD  ?predniSONE (DELTASONE) 10 MG tablet 20 mg daily for 7 days ?Patient not taking: Reported on 05/14/2021 02/22/21   Laurin Coder, MD  ?Suvorexant 20 MG TABS Take 1 tablet by mouth at bedtime. ?Patient not taking: Reported on 05/14/2021 02/22/21   Laurin Coder, MD  ?temazepam (RESTORIL) 30 MG capsule Take 1 capsule by mouth at bedtime as needed for sleep. ?Patient not taking: Reported on 05/14/2021 02/22/21   Laurin Coder, MD  ? ? ?Family History ?Family History  ?Problem Relation Age of Onset  ? Cancer Maternal Uncle   ?     Lung  ? Cancer Maternal Uncle   ?     Lung  ? Cancer Maternal Uncle   ?     Lung  ? Headache Neg Hx   ?     "I don't think so"  ? Migraines Neg Hx   ?     "I don't think so"  ? ? ?Social History ?Social  History  ? ?Tobacco Use  ? Smoking status: Every Day  ?  Packs/day: 0.25  ?  Years: 25.00  ?  Pack years: 6.25  ?  Types: Cigarettes  ? Smokeless tobacco: Never  ? Tobacco comments:  ?  Pt tried to quit - helps with anxiety   ?Vaping Use  ? Vaping Use: Never used  ?Substance Use Topics  ? Alcohol use: Yes  ? Drug use: No  ? ? ? ?Allergies   ?Hydroxyzine and Ambien [zolpidem tartrate] ? ? ?Review of Systems ?Review of Systems ?Per HPI ? ?Physical Exam ?Triage Vital Signs ?ED Triage Vitals  ?Enc Vitals Group  ?   BP 05/29/21 0817 117/77  ?   Pulse Rate 05/29/21 0817 87  ?   Resp 05/29/21 0817 18  ?   Temp 05/29/21 0817 97.9 ?F (36.6 ?C)  ?   Temp Source 05/29/21 0817 Oral  ?   SpO2 05/29/21 0817 94 %  ?   Weight 05/29/21 0818 179 lb (81.2 kg)  ?   Height 05/29/21 0818 '5\' 3"'$  (1.6 m)  ?   Head Circumference --   ?   Peak Flow --   ?   Pain Score 05/29/21 0818 8  ?   Pain Loc --   ?   Pain Edu? --   ?   Excl. in [place]? --   ? ?No data found. ? ?Updated Vital Signs ?BP 117/77 (BP Location: Left Arm)   Pulse 87   Temp 97.9 ?F (36.6 ?C) (Oral)   Resp 18   Ht '5\' 3"'$  (1.6 m)   Wt 179 lb (81.2 kg)   SpO2 94%   BMI 31.71 kg/m?  ? ?Visual Acuity ?Right Eye Distance:   ?Left Eye Distance:   ?Bilateral Distance:   ? ?Right Eye Near:   ?Left Eye Near:    ?Bilateral Near:    ? ?Physical Exam ?Constitutional:   ?   General: She is not in acute distress. ?   Appearance: Normal appearance. She is not toxic-appearing or diaphoretic.  ?HENT:  ?   Head: Normocephalic and atraumatic.  ?   Right Ear: Tympanic membrane and ear canal normal.  ?   Left Ear: Tympanic membrane and ear canal normal.  ?   Nose: Congestion present.  ?   Mouth/Throat:  ?   Mouth: Mucous membranes are moist.  ?   Pharynx: Posterior oropharyngeal erythema  present.  ?Eyes:  ?   Extraocular Movements: Extraocular movements intact.  ?   Conjunctiva/sclera: Conjunctivae normal.  ?   Pupils: Pupils are equal, round, and reactive to light.  ?Cardiovascular:  ?   Rate and  Rhythm: Normal rate and regular rhythm.  ?   Pulses: Normal pulses.  ?   Heart sounds: Normal heart sounds.  ?Pulmonary:  ?   Effort: Pulmonary effort is normal. No respiratory distress.  ?   Breath sounds: Normal breath sounds. No stridor. No wheezing, rhonchi or rales.  ?Abdominal:  ?   General: Abdomen is flat. Bowel sounds are normal.  ?   Palpations: Abdomen is soft.  ?Musculoskeletal:     ?   General: Normal range of motion.  ?   Cervical back: Normal range of motion.  ?Skin: ?   General: Skin is warm and dry.  ?Neurological:  ?   General: No focal deficit present.  ?   Mental Status: She is alert and oriented to person, place, and time. Mental status is at baseline.  ?Psychiatric:     ?   Mood and Affect: Mood normal.     ?   Behavior: Behavior normal.  ? ? ? ?UC Treatments / Results  ?Labs ?(all labs ordered are listed, but only abnormal results are displayed) ?Labs Reviewed  ?CULTURE, GROUP A STREP [facility])  ?COVID-19, FLU A+B NAA  ?POCT RAPID STREP A (OFFICE)  ? ? ?EKG ? ? ?Radiology ?No results found. ? ?Procedures ?Procedures (including critical care time) ? ?Medications Ordered in UC ?Medications - No data to display ? ?Initial Impression / Assessment and Plan / UC Course  ?I have reviewed the triage vital signs and the nursing notes. ? ?Pertinent labs & imaging results that were available during my care of the patient were reviewed by me and considered in my medical decision making (see chart for details). ? ?  ? ?Patient presents with symptoms likely from a viral upper respiratory infection. Differential includes bacterial pneumonia, sinusitis, allergic rhinitis, COVID-19, flu. Do not suspect underlying cardiopulmonary process. Symptoms seem unlikely related to ACS, CHF or COPD exacerbations, pneumonia, pneumothorax. Patient is nontoxic appearing and not in need of emergent medical intervention.  Strep was negative.  Throat culture, COVID-19, flu test pending. ? ?Recommended symptom control with over  the counter medications.  Patient sent prescriptions. ? ?Return if symptoms fail to improve in 1-2 weeks or you develop shortness of breath, chest pain, severe headache. Patient states understanding and is agreea

## 2021-05-30 LAB — COVID-19, FLU A+B NAA
Influenza A, NAA: NOT DETECTED
Influenza B, NAA: NOT DETECTED
SARS-CoV-2, NAA: NOT DETECTED

## 2021-06-01 DIAGNOSIS — Z20822 Contact with and (suspected) exposure to covid-19: Secondary | ICD-10-CM | POA: Diagnosis not present

## 2021-06-01 LAB — CULTURE, GROUP A STREP (THRC)

## 2021-06-04 ENCOUNTER — Encounter: Payer: Medicare Other | Admitting: Clinical

## 2021-06-04 DIAGNOSIS — Z20822 Contact with and (suspected) exposure to covid-19: Secondary | ICD-10-CM | POA: Diagnosis not present

## 2021-06-07 ENCOUNTER — Other Ambulatory Visit (HOSPITAL_COMMUNITY): Payer: Self-pay

## 2021-06-08 ENCOUNTER — Other Ambulatory Visit (HOSPITAL_COMMUNITY): Payer: Self-pay

## 2021-06-20 ENCOUNTER — Other Ambulatory Visit: Payer: Self-pay | Admitting: Pulmonary Disease

## 2021-06-20 ENCOUNTER — Telehealth: Payer: Self-pay | Admitting: Pulmonary Disease

## 2021-06-21 ENCOUNTER — Other Ambulatory Visit: Payer: Self-pay | Admitting: Pulmonary Disease

## 2021-06-21 NOTE — Telephone Encounter (Signed)
Spoke to patient.  ?She is calling for update on Temazepam Rx. She stated that she has been without for 2 nights. She would like this called in today. ? ?Dr. Ander Slade, please advise. thanks ?

## 2021-06-21 NOTE — Telephone Encounter (Signed)
Per Dr. Ander Slade via epic secure chat--Temazepam has been sent to [facility]. ? ?Patient is aware and voiced her understanding.  ?Nothing further needed.  ? ?

## 2021-06-21 NOTE — Telephone Encounter (Signed)
Pt checking back regarding temazepam refill.  Patient is completely out.  Please advise.  [phone] ?

## 2021-06-21 NOTE — Telephone Encounter (Signed)
Called patient and she states that she needs a refill on her Temazepam. Told patient I would message Dr Jenetta Downer and get this refilled.  ? ?Dr Jenetta Downer please advise  ?

## 2021-06-25 ENCOUNTER — Encounter: Payer: Self-pay | Admitting: Internal Medicine

## 2021-07-01 DIAGNOSIS — Z20822 Contact with and (suspected) exposure to covid-19: Secondary | ICD-10-CM | POA: Diagnosis not present

## 2021-07-08 ENCOUNTER — Telehealth: Payer: Self-pay | Admitting: Pulmonary Disease

## 2021-07-08 ENCOUNTER — Other Ambulatory Visit: Payer: Self-pay | Admitting: Pulmonary Disease

## 2021-07-08 NOTE — Telephone Encounter (Signed)
Please advise on refill. She has a follow up on 08/20/21. ?

## 2021-07-08 NOTE — Telephone Encounter (Signed)
I called to get information and had to leave a message. I wanted to see what symptoms she was having before I sent the request to the provider. Waiting on a call back.  ?

## 2021-07-09 ENCOUNTER — Other Ambulatory Visit (HOSPITAL_COMMUNITY): Payer: Self-pay

## 2021-07-09 NOTE — Telephone Encounter (Signed)
Spoke with the pt and she is requesting rx for Belsomra  ?She says that she takes this and the restoril together  ? ?

## 2021-07-09 NOTE — Telephone Encounter (Signed)
Patient states needs refill for Belsomra. Pharmacy is [facility] [address] Patient phone number is [phone]. ?

## 2021-07-10 NOTE — Telephone Encounter (Signed)
Dr. Ander Slade, can you please advise? Thanks!  ?

## 2021-07-11 ENCOUNTER — Ambulatory Visit: Payer: Medicare Other | Admitting: Physician Assistant

## 2021-07-11 NOTE — Telephone Encounter (Signed)
Epic chat sent to AO.  ?

## 2021-07-15 ENCOUNTER — Ambulatory Visit (INDEPENDENT_AMBULATORY_CARE_PROVIDER_SITE_OTHER): Payer: Medicare Other | Admitting: Pulmonary Disease

## 2021-07-15 ENCOUNTER — Encounter: Payer: Self-pay | Admitting: Pulmonary Disease

## 2021-07-15 VITALS — BP 118/72 | HR 105 | Temp 98.0°F | Ht 63.0 in | Wt 170.0 lb

## 2021-07-15 DIAGNOSIS — F5104 Psychophysiologic insomnia: Secondary | ICD-10-CM | POA: Diagnosis not present

## 2021-07-15 MED ORDER — BELSOMRA 20 MG PO TABS: 20.0000 mg | ORAL_TABLET | Freq: Every evening | ORAL | 5 refills | Status: DC

## 2021-07-15 MED ORDER — TEMAZEPAM 30 MG PO CAPS: 30.0000 mg | ORAL_CAPSULE | Freq: Every evening | ORAL | 5 refills | Status: DC | PRN

## 2021-07-15 NOTE — Progress Notes (Signed)
? ?Subjective:  ? ? Patient ID: Natalie Edwards, female    DOB: 06/06/1965, 56 y.o.   MRN: [id number] ? ?Patient with a history of chronic insomnia ? ?Evaluated in the past about 5 years ago  ?Restoril has been helping-uses 30 mg nightly ?-Runs out of medications Belsomra ?Milligrams added ?Using both medications have resulted in getting about 6 hours of sleep ?Does not wake up feeling groggy ?Feels she gets good enough sleep, when she wakes up in the middle of the night she is able to go back to sleep ? ?She is doing the third shift at present ?There has been no change in schedule, this has helped her sleep a little bit ?Still with some difficulty falling asleep and maintaining sleep ? ?Medication appears to be working well ? ?She does have a history of snoring, no witnessed apneas ?She is only sleepy during the day when she does not get adequate rest at night ?Occasional dryness of her mouth in the mornings ?She does have headaches ? ?No family history of sleep disordered breathing ? ?She is an active smoker, a pack of cigarettes lasts her about 3 days ?Continues to try to quit, not able to at present ? ?Has a lot of stressors ? ? ? ?Continues to smoke actively ?Past Medical History:  ?Diagnosis Date  ? Allergy   ? Anemia   ? Diverticulosis   ? Gall bladder stones 1993  ? Gastric ulcer   ? GERD (gastroesophageal reflux disease)   ? Hypertension   ? Renal mass   ? ?Social History  ? ?Socioeconomic History  ? Marital status: Single  ?  Spouse name: Not on file  ? Number of children: 3  ? Years of education: Not on file  ? Highest education level: High school graduate  ?Occupational History  ? Not on file  ?Tobacco Use  ? Smoking status: Every Day  ?  Packs/day: 0.25  ?  Years: 25.00  ?  Pack years: 6.25  ?  Types: Cigarettes  ? Smokeless tobacco: Never  ? Tobacco comments:  ?  Pt tried to quit - helps with anxiety   ?Vaping Use  ? Vaping Use: Never used  ?Substance and Sexual Activity  ? Alcohol use: Yes  ? Drug use: No   ? Sexual activity: Yes  ?  Comment: hysterectomy  ?Other Topics Concern  ? Not on file  ?Social History Narrative  ? Lives at home in an apartment. Her mother lives with her.   ? Right handed  ? Caffeine: drinks approx. 36 oz of pepsi per day. Sometimes drinks 2 cups of coffee in a day as well.   ? ?Social Determinants of Health  ? ?Financial Resource Strain: Not on file  ?Food Insecurity: Not on file  ?Transportation Needs: Not on file  ?Physical Activity: Not on file  ?Stress: Not on file  ?Social Connections: Not on file  ?Intimate Partner Violence: Not on file  ? ?Family History  ?Problem Relation Age of Onset  ? Cancer Maternal Uncle   ?     Lung  ? Cancer Maternal Uncle   ?     Lung  ? Cancer Maternal Uncle   ?     Lung  ? Headache Neg Hx   ?     "I don't think so"  ? Migraines Neg Hx   ?     "I don't think so"  ? ?Review of Systems  ?Constitutional:  Negative for fever and unexpected  weight change.  ?HENT:  Negative for congestion, dental problem, ear pain, nosebleeds, postnasal drip, rhinorrhea, sinus pressure, sneezing, sore throat and trouble swallowing.   ?Eyes:  Negative for redness and itching.  ?Respiratory: Negative.    ?Cardiovascular: Negative.   ?Gastrointestinal: Negative.   ?Genitourinary:  Negative for dysuria.  ?Musculoskeletal:  Negative for joint swelling.  ?Skin:  Negative for rash.  ?Allergic/Immunologic: Negative.  Negative for environmental allergies, food allergies and immunocompromised state.  ?Neurological:  Negative for headaches.  ?Hematological:  Does not bruise/bleed easily.  ?Psychiatric/Behavioral:  Negative for dysphoric mood. The patient is nervous/anxious.   ?   ?Objective:  ? Physical Exam ?Constitutional:   ?   Appearance: Normal appearance.  ?HENT:  ?   Mouth/Throat:  ?   Comments: Mallampati 1, ?Eyes:  ?   General:     ?   Right eye: No discharge.     ?   Left eye: No discharge.  ?Cardiovascular:  ?   Rate and Rhythm: Normal rate and regular rhythm.  ?   Pulses: Normal  pulses.  ?   Heart sounds: No murmur heard. ?  No friction rub.  ?Pulmonary:  ?   Effort: Pulmonary effort is normal. No respiratory distress.  ?   Breath sounds: No stridor. No wheezing or rhonchi.  ?Musculoskeletal:  ?   Cervical back: No rigidity or tenderness.  ?Neurological:  ?   Mental Status: She is alert.  ?Psychiatric:     ?   Mood and Affect: Mood normal.  ? ?Vitals:  ? 07/15/21 1411  ?BP: 118/72  ?Pulse: (!) 105  ?Temp: 98 ?F (36.7 ?C)  ?SpO2: 97%  ? ? ?   ?Assessment & Plan:  ?Sleep onset and sleep maintenance insomnia ?-Continue with Restoril 30 mg ?-Belsomra 20 mg ?-Unfortunately combination of these medications is the only thing that have helped with insurance to get an adequate amount of hours of sleep and wakes up feeling rejuvenated ?-Denies significant daytime symptoms ?-Denies sleepiness during the day ? ?History of depression and anxiety ?-Continue to optimize treatment ? ?Nicotine addiction ?-She continues to work on quitting smoking ? ?Side effect profile of combining medications was discussed ?-She is aware to monitor symptoms closely ? ?Plan: ?Continue Restoril 30 mg ?Belsomra 20 mg ? ?Smoking cessation encouraged ? ?Stimulus control as able ? ?Follow-up in about 6 months ? ?Encouraged to call with any significant concerns ? ?Most significant risk is sleepiness, waking up groggy, reviewed extensively ? ? ?

## 2021-07-15 NOTE — Patient Instructions (Signed)
Prescription for Belsomra 20 mg for 30 days with 5 refills sent to pharmacy ? ?Prescription for Restoril 30 mg nightly for 30 days with 5 refills sent to pharmacy ? ?Using both medications may cause sleepiness, dizziness ? ?I will see you in about 5 months ? ?Call with significant concerns ?

## 2021-07-20 DIAGNOSIS — Z20822 Contact with and (suspected) exposure to covid-19: Secondary | ICD-10-CM | POA: Diagnosis not present

## 2021-08-01 NOTE — Telephone Encounter (Signed)
Pt's med was refilled. Nothing further needed.

## 2021-08-13 ENCOUNTER — Other Ambulatory Visit (HOSPITAL_COMMUNITY): Payer: Self-pay

## 2021-08-20 ENCOUNTER — Ambulatory Visit: Payer: Medicare Other | Admitting: Pulmonary Disease

## 2021-09-13 ENCOUNTER — Telehealth: Payer: Self-pay | Admitting: Pulmonary Disease

## 2021-09-13 ENCOUNTER — Other Ambulatory Visit: Payer: Self-pay | Admitting: Pulmonary Disease

## 2021-09-13 MED ORDER — BELSOMRA 20 MG PO TABS: 20.0000 mg | ORAL_TABLET | Freq: Every evening | ORAL | 5 refills | Status: DC

## 2021-09-13 NOTE — Telephone Encounter (Signed)
Called and spoke with pt to verify that new pharmacy for her [place].  Have changed pt's pharmacy to show [facility].  Routing to Dr. Jenetta Downer so we can get the Rx sent to pharmacy for pt.

## 2021-10-03 NOTE — Telephone Encounter (Signed)
Reviewed patient's chart and see where Dr. Ander Slade send in the RX on 09/13/21. Will close encounter.

## 2021-11-27 ENCOUNTER — Ambulatory Visit
Admission: EM | Admit: 2021-11-27 | Discharge: 2021-11-27 | Disposition: A | Discharge status: Home / Self Care | Payer: Medicare Other | Attending: Physician Assistant | Admitting: Physician Assistant

## 2021-11-27 ENCOUNTER — Encounter: Payer: Self-pay | Admitting: Emergency Medicine

## 2021-11-27 DIAGNOSIS — M25511 Pain in right shoulder: Secondary | ICD-10-CM | POA: Diagnosis not present

## 2021-11-27 DIAGNOSIS — M62838 Other muscle spasm: Secondary | ICD-10-CM

## 2021-11-27 DIAGNOSIS — M545 Low back pain, unspecified: Secondary | ICD-10-CM

## 2021-11-27 DIAGNOSIS — M542 Cervicalgia: Secondary | ICD-10-CM | POA: Diagnosis not present

## 2021-11-27 MED ORDER — IBUPROFEN 600 MG PO TABS: 600.0000 mg | ORAL_TABLET | Freq: Four times a day (QID) | ORAL | 0 refills | Status: DC | PRN

## 2021-11-27 MED ORDER — TIZANIDINE HCL 4 MG PO TABS: 4.0000 mg | ORAL_TABLET | Freq: Four times a day (QID) | ORAL | 0 refills | Status: DC | PRN

## 2021-11-27 NOTE — ED Triage Notes (Signed)
Pt c/o neck, lower back, and right arm pain. Been taking tylenol says it is not helping.   Onset: This morning

## 2021-11-27 NOTE — Discharge Instructions (Signed)
Advised to use ice therapy, 10 minutes on 20 minutes off, 3-4 times throughout the evening to help reduce pain and discomfort. Advised to take ibuprofen 600 mg every 6-8 hours with food to help reduce pain. Advised take Zanaflex every 6 hours to help reduce muscle spasm and irritability. Advised to follow-up PCP or return to urgent care if symptoms fail to improve.

## 2021-11-27 NOTE — ED Provider Notes (Signed)
[facility]    CSN: [id number] Arrival date & time: 11/27/21  1327      History   Chief Complaint Chief Complaint  Patient presents with   Back Pain    HPI Natalie Edwards is a 56 y.o. female.   56 year old female presents with neck, right arm, and lower back pain.  Patient indicates this morning she woke up and started having neck pain which moved into the right arm and then also travel down to the lower back.  She relates the pain is being moderately severe, and is worse with movement.  Patient indicates that in the right arm she is starting to have some weakness, numbness and tingling which is intermittent.  Patient relates that she has been taken Tylenol but this has not given her relief.  Patient also indicates that she has not injured her neck, shoulder, or back doing any lifting over the past couple days.  Patient denies any fever, chills, or problems with urinary control.  Patient indicates she does not have any lower leg numbness, or weakness   Back Pain   Past Medical History:  Diagnosis Date   Allergy    Anemia    Diverticulosis    Gall bladder stones 1993   Gastric ulcer    GERD (gastroesophageal reflux disease)    Hypertension    Renal mass     Patient Active Problem List   Diagnosis Date Noted   Anxiety and depression 05/14/2021   Breathing difficult 05/14/2021   Upper respiratory tract infection 05/14/2021   GERD (gastroesophageal reflux disease)    Visit for routine gyn exam 01/14/2021   Screening mammogram, encounter for 01/14/2021   Possible exposure to STD 01/14/2021   Chronic insomnia 10/18/2019   CKD (chronic kidney disease) 04/07/2019   GAD (generalized anxiety disorder) 05/07/2018   Chronic gastric ulcer 05/07/2018   Essential hypertension 05/07/2018   Iron deficiency anemia due to chronic blood loss 09/23/2016   Peptic ulcer disease with hemorrhage 09/23/2016   Thrombocytosis 09/09/2016   Leukocytosis 09/09/2016   H/O ulcer  disease 07/31/2016    Past Surgical History:  Procedure Laterality Date   ABDOMINAL HYSTERECTOMY     CHOLECYSTECTOMY      OB History     Gravida  7   Para  3   Term  3   Preterm      AB  4   Living         SAB      IAB  4   Ectopic      Multiple      Live Births               Home Medications    Prior to Admission medications   Medication Sig Start Date End Date Taking? Authorizing Provider  ibuprofen (ADVIL) 600 MG tablet Take 1 tablet (600 mg total) by mouth every 6 (six) hours as needed. 11/27/21  Yes Nyoka Lint, PA-C  tiZANidine (ZANAFLEX) 4 MG tablet Take 1 tablet (4 mg total) by mouth every 6 (six) hours as needed for muscle spasms. 11/27/21  Yes Nyoka Lint, PA-C  amLODipine-benazepril (LOTREL) 10-40 MG capsule TAKE 1 CAPSULE BY MOUTH DAILY 05/14/21 05/14/22  Fenton Foy, NP  benzonatate (TESSALON) 100 MG capsule Take 1 capsule (100 mg total) by mouth every 8 (eight) hours as needed for cough. 05/29/21   Teodora Medici, FNP  esomeprazole (NEXIUM) 40 MG capsule TAKE 1 CAPSULE BY MOUTH 2 TIMES DAILY 05/14/21  05/14/22  Fenton Foy, NP  hydrochlorothiazide (HYDRODIURIL) 25 MG tablet Take 1 tablet by mouth daily. 05/23/21     loratadine (CLARITIN) 10 MG tablet Take 1 tablet (10 mg total) by mouth daily. 08/18/19   Nicolette Bang, MD  mirtazapine (REMERON) 15 MG tablet Take 1 tablet by mouth at bedtime 02/14/21     Suvorexant (BELSOMRA) 20 MG TABS Take 20 mg by mouth at bedtime. 09/13/21   Olalere, Cicero Duck A, MD  temazepam (RESTORIL) 30 MG capsule Take 1 capsule (30 mg total) by mouth at bedtime as needed for sleep. 07/15/21   Laurin Coder, MD    Family History Family History  Problem Relation Age of Onset   Cancer Maternal Uncle        Lung   Cancer Maternal Uncle        Lung   Cancer Maternal Uncle        Lung   Headache Neg Hx        "I don't think so"   Migraines Neg Hx        "I don't think so"    Social History Social  History   Tobacco Use   Smoking status: Every Day    Packs/day: 0.25    Years: 25.00    Total pack years: 6.25    Types: Cigarettes   Smokeless tobacco: Never   Tobacco comments:    Pt tried to quit - helps with anxiety   Vaping Use   Vaping Use: Never used  Substance Use Topics   Alcohol use: Yes   Drug use: No     Allergies   Hydroxyzine and Ambien [zolpidem tartrate]   Review of Systems Review of Systems  Musculoskeletal:  Positive for back pain (lower back) and neck pain (right side with right shoulder pain).     Physical Exam Triage Vital Signs ED Triage Vitals  Enc Vitals Group     BP 11/27/21 1401 118/70     Pulse Rate 11/27/21 1401 96     Resp --      Temp 11/27/21 1401 98.2 F (36.8 C)     Temp Source 11/27/21 1401 Oral     SpO2 11/27/21 1401 95 %     Weight --      Height --      Head Circumference --      Peak Flow --      Pain Score 11/27/21 1400 10     Pain Loc --      Pain Edu? --      Excl. in [place]? --    No data found.  Updated Vital Signs BP 118/70 (BP Location: Right Arm)   Pulse 96   Temp 98.2 F (36.8 C) (Oral)   SpO2 95%   Visual Acuity Right Eye Distance:   Left Eye Distance:   Bilateral Distance:    Right Eye Near:   Left Eye Near:    Bilateral Near:     Physical Exam Constitutional:      Appearance: Normal appearance.  Neck:     Comments: Neck: Pain on palpation of the posterior neck, right side around C7, no swelling or redness. Cardiovascular:     Rate and Rhythm: Normal rate and regular rhythm.     Heart sounds: Normal heart sounds.  Pulmonary:     Effort: Pulmonary effort is normal.     Breath sounds: Normal breath sounds and air entry. No wheezing, rhonchi or rales.  Musculoskeletal:  Arms:     Cervical back: Normal range of motion.     Comments: Right shoulder: Pain on palpation and along the anterior and posterior trapezius area and the upper deltoid.  Range of motion is normal without crepitus.   Strength is decreased and is 3/5 as opposed to 5/5 on the left.  Back: It is palpated along L3 to-L5 area, no swelling or redness.  Negative straight leg raise bilaterally, strength is intact bilaterally  Neurological:     Mental Status: She is alert.      UC Treatments / Results  Labs (all labs ordered are listed, but only abnormal results are displayed) Labs Reviewed - No data to display  EKG   Radiology No results found.  Procedures Procedures (including critical care time)  Medications Ordered in UC Medications - No data to display  Initial Impression / Assessment and Plan / UC Course  I have reviewed the triage vital signs and the nursing notes.  Pertinent labs & imaging results that were available during my care of the patient were reviewed by me and considered in my medical decision making (see chart for details).       Plan: 1.  Advised ice therapy, 10 minutes on 20 minutes off, 3-4 times throughout the day to help reduce pain and swelling. 2.  Advised take ibuprofen 600 mg every 8 hours with food to help reduce pain and swelling. 3.  Advised to take Zanaflex every 6 hours to help reduce muscle spasm and irritability. 4.  Advised to follow-up PCP or return to urgent care if symptoms fail to improve. Final Clinical Impressions(s) / UC Diagnoses   Final diagnoses:  Acute midline low back pain without sciatica  Neck pain  Acute pain of right shoulder  Muscle spasm     Discharge Instructions      Advised to use ice therapy, 10 minutes on 20 minutes off, 3-4 times throughout the evening to help reduce pain and discomfort. Advised to take ibuprofen 600 mg every 6-8 hours with food to help reduce pain. Advised take Zanaflex every 6 hours to help reduce muscle spasm and irritability. Advised to follow-up PCP or return to urgent care if symptoms fail to improve.     ED Prescriptions     Medication Sig Dispense Auth. Provider   tiZANidine (ZANAFLEX) 4 MG  tablet Take 1 tablet (4 mg total) by mouth every 6 (six) hours as needed for muscle spasms. 30 tablet Nyoka Lint, PA-C   ibuprofen (ADVIL) 600 MG tablet Take 1 tablet (600 mg total) by mouth every 6 (six) hours as needed. 30 tablet Nyoka Lint, PA-C      PDMP not reviewed this encounter.   Nyoka Lint, PA-C 11/27/21 1429

## 2021-11-28 ENCOUNTER — Encounter (HOSPITAL_BASED_OUTPATIENT_CLINIC_OR_DEPARTMENT_OTHER): Payer: Self-pay

## 2021-11-28 ENCOUNTER — Other Ambulatory Visit: Payer: Self-pay

## 2021-11-28 ENCOUNTER — Emergency Department (HOSPITAL_BASED_OUTPATIENT_CLINIC_OR_DEPARTMENT_OTHER)
Admission: EM | Admit: 2021-11-28 | Discharge: 2021-11-28 | Disposition: A | Discharge status: Home / Self Care | Payer: Medicare Other | Attending: Emergency Medicine | Admitting: Emergency Medicine

## 2021-11-28 DIAGNOSIS — M5412 Radiculopathy, cervical region: Secondary | ICD-10-CM | POA: Diagnosis not present

## 2021-11-28 DIAGNOSIS — M542 Cervicalgia: Secondary | ICD-10-CM | POA: Diagnosis present

## 2021-11-28 LAB — CBC WITH DIFFERENTIAL/PLATELET
Abs Immature Granulocytes: 0.02 10*3/uL (ref 0.00–0.07)
Basophils Absolute: 0 10*3/uL (ref 0.0–0.1)
Basophils Relative: 0 %
Eosinophils Absolute: 0.2 10*3/uL (ref 0.0–0.5)
Eosinophils Relative: 2 %
HCT: 35.2 % — ABNORMAL LOW (ref 36.0–46.0)
Hemoglobin: 11.5 g/dL — ABNORMAL LOW (ref 12.0–15.0)
Immature Granulocytes: 0 %
Lymphocytes Relative: 42 %
Lymphs Abs: 5.3 10*3/uL — ABNORMAL HIGH (ref 0.7–4.0)
MCH: 29.3 pg (ref 26.0–34.0)
MCHC: 32.7 g/dL (ref 30.0–36.0)
MCV: 89.6 fL (ref 80.0–100.0)
Monocytes Absolute: 0.8 10*3/uL (ref 0.1–1.0)
Monocytes Relative: 7 %
Neutro Abs: 6.2 10*3/uL (ref 1.7–7.7)
Neutrophils Relative %: 49 %
Platelets: 420 10*3/uL — ABNORMAL HIGH (ref 150–400)
RBC: 3.93 MIL/uL (ref 3.87–5.11)
RDW: 16.4 % — ABNORMAL HIGH (ref 11.5–15.5)
WBC: 12.6 10*3/uL — ABNORMAL HIGH (ref 4.0–10.5)
nRBC: 0 % (ref 0.0–0.2)

## 2021-11-28 LAB — BASIC METABOLIC PANEL
Anion gap: 8 (ref 5–15)
BUN: 20 mg/dL (ref 6–20)
CO2: 24 mmol/L (ref 22–32)
Calcium: 9.3 mg/dL (ref 8.9–10.3)
Chloride: 104 mmol/L (ref 98–111)
Creatinine, Ser: 1.25 mg/dL — ABNORMAL HIGH (ref 0.44–1.00)
GFR, Estimated: 50 mL/min — ABNORMAL LOW (ref >60)
Glucose, Bld: 94 mg/dL (ref 70–99)
Potassium: 4 mmol/L (ref 3.5–5.1)
Sodium: 136 mmol/L (ref 135–145)

## 2021-11-28 MED ORDER — HYDROCODONE-ACETAMINOPHEN 5-325 MG PO TABS: 2.0000 | ORAL_TABLET | ORAL | 0 refills | Status: DC | PRN

## 2021-11-28 MED ORDER — SODIUM CHLORIDE 0.9 % IV BOLUS
1000.0000 mL | Freq: Once | INTRAVENOUS | Status: AC
  Administered 2021-11-28: 1000 mL via INTRAVENOUS

## 2021-11-28 NOTE — ED Notes (Signed)
Pt dc'd home w/all belongings, a/o x4, ambulatory upon dc, drove self home, no narcotics given in ED. Pt verbalizes understanding of dc, opportunity for questions given.

## 2021-11-28 NOTE — Discharge Instructions (Signed)
Continue to use Voltaren gel and Lidoderm patches to the area.  Follow-up with your primary care doctor for recheck.  Return to emergency room if you have any worsening symptoms.  Make sure that you check your blood pressure prior to taking your Lotrel tomorrow.  Do not take the blood pressure medication if your blood pressure is less than 110 (top number).  Stop taking the Zanaflex.

## 2021-11-28 NOTE — ED Triage Notes (Signed)
Pt. Aaxo4, ambulatory, cc of R sided pain that originates from back of the neck down her upper R extremities and R back. Patient states she was seen at urgent care and sent home with dx of back pain without sciatica. Prescribed medications she has been taking but have not been helping, states she cannot take NSAIDs due to ulcers.

## 2021-11-28 NOTE — ED Provider Notes (Signed)
[place] [facility] DEPT Provider Note   CSN: [id number] Arrival date & time: 11/28/21  4193     History  Chief Complaint  Patient presents with   Extremity Pain    R Sided Pain    Natalie Edwards is a 56 y.o. female.  Patient is a 56 year old female who presents with neck pain.  She said she woke up with it yesterday morning.  It starts in the back of her right neck and radiates down her right arm and also down her right back.  She says is worse with certain movements.  She has some intermittent numbness in her right hand.  She feels like she has a little weakness in the right hand.  No associated chest pain or abdominal pain.  No fevers.  No history of recent injuries.  No similar symptoms in the past.  She went to urgent care and was given a prescription for Zanaflex, ibuprofen.  However she said she cannot take ibuprofen due to prior peptic ulcer disease and the Zanaflex is not helping.  She also tried some Lidoderm patches which she says is not helping.  She currently denies any numbness in the hand but says it feels a little weaker than normal.       Home Medications Prior to Admission medications   Medication Sig Start Date End Date Taking? Authorizing Provider  HYDROcodone-acetaminophen (NORCO/VICODIN) 5-325 MG tablet Take 2 tablets by mouth every 4 (four) hours as needed. 11/28/21  Yes Malvin Johns, MD  amLODipine-benazepril (LOTREL) 10-40 MG capsule TAKE 1 CAPSULE BY MOUTH DAILY 05/14/21 05/14/22  Fenton Foy, NP  benzonatate (TESSALON) 100 MG capsule Take 1 capsule (100 mg total) by mouth every 8 (eight) hours as needed for cough. 05/29/21   Teodora Medici, FNP  esomeprazole (NEXIUM) 40 MG capsule TAKE 1 CAPSULE BY MOUTH 2 TIMES DAILY 05/14/21 05/14/22  Fenton Foy, NP  hydrochlorothiazide (HYDRODIURIL) 25 MG tablet Take 1 tablet by mouth daily. 05/23/21     ibuprofen (ADVIL) 600 MG tablet Take 1 tablet (600 mg total) by mouth every 6 (six) hours as needed.  11/27/21   Nyoka Lint, PA-C  loratadine (CLARITIN) 10 MG tablet Take 1 tablet (10 mg total) by mouth daily. 08/18/19   Nicolette Bang, MD  mirtazapine (REMERON) 15 MG tablet Take 1 tablet by mouth at bedtime 02/14/21     Suvorexant (BELSOMRA) 20 MG TABS Take 20 mg by mouth at bedtime. 09/13/21   Olalere, Cicero Duck A, MD  temazepam (RESTORIL) 30 MG capsule Take 1 capsule (30 mg total) by mouth at bedtime as needed for sleep. 07/15/21   Laurin Coder, MD      Allergies    Hydroxyzine and Ambien [zolpidem tartrate]    Review of Systems   Review of Systems  Constitutional:  Negative for chills, diaphoresis, fatigue and fever.  HENT:  Negative for congestion, rhinorrhea and sneezing.   Eyes: Negative.   Respiratory:  Negative for cough, chest tightness and shortness of breath.   Cardiovascular:  Negative for chest pain and leg swelling.  Gastrointestinal:  Negative for abdominal pain, blood in stool, diarrhea, nausea and vomiting.  Genitourinary:  Negative for difficulty urinating, flank pain, frequency and hematuria.  Musculoskeletal:  Positive for neck pain. Negative for arthralgias and back pain.  Skin:  Negative for rash.  Neurological:  Positive for weakness. Negative for dizziness, speech difficulty, numbness and headaches.    Physical Exam Updated Vital Signs BP 112/72   Pulse  80   Temp 98.7 F (37.1 C) (Oral)   Resp 16   Ht '5\' 3"'$  (1.6 m)   Wt 70.8 kg   SpO2 96%   BMI 27.63 kg/m  Physical Exam Constitutional:      Appearance: She is well-developed.  HENT:     Head: Normocephalic and atraumatic.  Eyes:     Pupils: Pupils are equal, round, and reactive to light.  Neck:     Comments: Positive tenderness down the right paracervical area and in the right upper back.  Radial pulses are intact.  She has normal sensation to light touch in the right arm.  She has symmetric grip strength in the upper extremities bilaterally. Cardiovascular:     Rate and Rhythm: Normal  rate and regular rhythm.     Heart sounds: Normal heart sounds.  Pulmonary:     Effort: Pulmonary effort is normal. No respiratory distress.     Breath sounds: Normal breath sounds. No wheezing or rales.  Chest:     Chest wall: No tenderness.  Abdominal:     General: Bowel sounds are normal.     Palpations: Abdomen is soft.     Tenderness: There is no abdominal tenderness. There is no guarding or rebound.  Musculoskeletal:        General: Normal range of motion.     Cervical back: Normal range of motion and neck supple.  Lymphadenopathy:     Cervical: No cervical adenopathy.  Skin:    General: Skin is warm and dry.     Findings: No rash.  Neurological:     Mental Status: She is alert and oriented to person, place, and time.     ED Results / Procedures / Treatments   Labs (all labs ordered are listed, but only abnormal results are displayed) Labs Reviewed  BASIC METABOLIC PANEL - Abnormal; Notable for the following components:      Result Value   Creatinine, Ser 1.25 (*)    GFR, Estimated 50 (*)    All other components within normal limits  CBC WITH DIFFERENTIAL/PLATELET - Abnormal; Notable for the following components:   WBC 12.6 (*)    Hemoglobin 11.5 (*)    HCT 35.2 (*)    RDW 16.4 (*)    Platelets 420 (*)    Lymphs Abs 5.3 (*)    All other components within normal limits    EKG None  Radiology No results found.  Procedures Procedures    Medications Ordered in ED Medications  sodium chloride 0.9 % bolus 1,000 mL (0 mLs Intravenous Stopped 11/28/21 0951)    ED Course/ Medical Decision Making/ A&P                           Medical Decision Making Amount and/or Complexity of Data Reviewed Labs: ordered.  Risk Prescription drug management.   Patient is a 56 year old female who presents with neck pain that radiates down her right arm with some intermittent numbness and weakness in her right arm.  Currently I do not appreciate any neurologic deficits.   No recent trauma.  She does not have associated chest pain, abdominal pain or other symptoms that would be more concerning for vascular disease such as thoracic aneurysm/dissection.  Her blood pressure was noted to be low on recheck with a systolic in the 96V.  She does say that she took her Zanaflex this morning along with her Lotrel.  She also takes 30  mg of Restoril at night and 20 mg of Belsomra.  I suspect the combination of medications led to the low blood pressure.  She is not symptomatic with this.  There is no dizziness.  She had labs checked.  Her hemoglobin is okay.  Her creatinine is mildly elevated but on chart review is similar to her prior values.  I did review recent office visits and her blood pressure has been in the low low 100s.  We did check a blood pressures in both of her arms and its symmetric.  She was given a liter of IV fluids.  Her blood pressure improved to 112/72.  She was discharged home in good condition.  At this point I do not feel there is an indication for hospitalization.  She was given symptomatic care instructions and given a prescription for short course of Vicodin.  She was advised to stop taking the Zanaflex.  She was advised to make sure she checks her blood pressure prior to taking her morning blood pressure medicine.  Return precautions were given.  She does have an appointment with her PCP later this month.  Final Clinical Impression(s) / ED Diagnoses Final diagnoses:  Cervical radiculitis    Rx / DC Orders ED Discharge Orders          Ordered    HYDROcodone-acetaminophen (NORCO/VICODIN) 5-325 MG tablet  Every 4 hours PRN        11/28/21 0956              Malvin Johns, MD 11/28/21 1001

## 2021-11-29 ENCOUNTER — Emergency Department (HOSPITAL_BASED_OUTPATIENT_CLINIC_OR_DEPARTMENT_OTHER): Payer: Medicare Other

## 2021-11-29 ENCOUNTER — Encounter (HOSPITAL_BASED_OUTPATIENT_CLINIC_OR_DEPARTMENT_OTHER): Payer: Self-pay

## 2021-11-29 ENCOUNTER — Other Ambulatory Visit: Payer: Self-pay

## 2021-11-29 ENCOUNTER — Emergency Department (HOSPITAL_BASED_OUTPATIENT_CLINIC_OR_DEPARTMENT_OTHER)
Admission: EM | Admit: 2021-11-29 | Discharge: 2021-11-29 | Disposition: A | Discharge status: Home / Self Care | Payer: Medicare Other | Attending: Emergency Medicine | Admitting: Emergency Medicine

## 2021-11-29 DIAGNOSIS — M5412 Radiculopathy, cervical region: Secondary | ICD-10-CM | POA: Diagnosis not present

## 2021-11-29 DIAGNOSIS — M4802 Spinal stenosis, cervical region: Secondary | ICD-10-CM | POA: Diagnosis not present

## 2021-11-29 DIAGNOSIS — M542 Cervicalgia: Secondary | ICD-10-CM | POA: Diagnosis not present

## 2021-11-29 LAB — CBC WITH DIFFERENTIAL/PLATELET
Abs Immature Granulocytes: 0.05 10*3/uL (ref 0.00–0.07)
Basophils Absolute: 0 10*3/uL (ref 0.0–0.1)
Basophils Relative: 0 %
Eosinophils Absolute: 0.3 10*3/uL (ref 0.0–0.5)
Eosinophils Relative: 2 %
HCT: 36.8 % (ref 36.0–46.0)
Hemoglobin: 12.2 g/dL (ref 12.0–15.0)
Immature Granulocytes: 0 %
Lymphocytes Relative: 42 %
Lymphs Abs: 6.2 10*3/uL — ABNORMAL HIGH (ref 0.7–4.0)
MCH: 29.6 pg (ref 26.0–34.0)
MCHC: 33.2 g/dL (ref 30.0–36.0)
MCV: 89.3 fL (ref 80.0–100.0)
Monocytes Absolute: 1 10*3/uL (ref 0.1–1.0)
Monocytes Relative: 7 %
Neutro Abs: 7.1 10*3/uL (ref 1.7–7.7)
Neutrophils Relative %: 49 %
Platelets: 403 10*3/uL — ABNORMAL HIGH (ref 150–400)
RBC: 4.12 MIL/uL (ref 3.87–5.11)
RDW: 16.4 % — ABNORMAL HIGH (ref 11.5–15.5)
WBC: 14.7 10*3/uL — ABNORMAL HIGH (ref 4.0–10.5)
nRBC: 0 % (ref 0.0–0.2)

## 2021-11-29 LAB — COMPREHENSIVE METABOLIC PANEL
ALT: 11 U/L (ref 0–44)
AST: 13 U/L — ABNORMAL LOW (ref 15–41)
Albumin: 4.3 g/dL (ref 3.5–5.0)
Alkaline Phosphatase: 50 U/L (ref 38–126)
Anion gap: 10 (ref 5–15)
BUN: 11 mg/dL (ref 6–20)
CO2: 23 mmol/L (ref 22–32)
Calcium: 9.4 mg/dL (ref 8.9–10.3)
Chloride: 108 mmol/L (ref 98–111)
Creatinine, Ser: 0.96 mg/dL (ref 0.44–1.00)
GFR, Estimated: >60 mL/min (ref >60)
Glucose, Bld: 97 mg/dL (ref 70–99)
Potassium: 4.1 mmol/L (ref 3.5–5.1)
Sodium: 141 mmol/L (ref 135–145)
Total Bilirubin: 0.2 mg/dL — ABNORMAL LOW (ref 0.3–1.2)
Total Protein: 7.5 g/dL (ref 6.5–8.1)

## 2021-11-29 MED ORDER — FENTANYL CITRATE PF 50 MCG/ML IJ SOSY
50.0000 ug | PREFILLED_SYRINGE | Freq: Once | INTRAMUSCULAR | Status: AC
  Administered 2021-11-29: 50 ug via INTRAVENOUS
  Filled 2021-11-29: qty 1

## 2021-11-29 MED ORDER — METHYLPREDNISOLONE 4 MG PO TBPK: ORAL_TABLET | ORAL | 0 refills | Status: DC

## 2021-11-29 MED ORDER — ONDANSETRON HCL 4 MG/2ML IJ SOLN
4.0000 mg | Freq: Once | INTRAMUSCULAR | Status: AC
  Administered 2021-11-29: 4 mg via INTRAVENOUS
  Filled 2021-11-29: qty 2

## 2021-11-29 MED ORDER — DEXAMETHASONE SODIUM PHOSPHATE 10 MG/ML IJ SOLN
10.0000 mg | Freq: Once | INTRAMUSCULAR | Status: AC
  Administered 2021-11-29: 10 mg via INTRAVENOUS
  Filled 2021-11-29: qty 1

## 2021-11-29 NOTE — Progress Notes (Signed)
Patient ID: Natalie Edwards, female    DOB: Jan 07, 1966  MRN: [id number]  CC: Medication Refill   Subjective: Natalie Edwards is a 56 y.o. female who presents for medication refill.   Her concerns today include:  11/27/2021 [facility] per PA note: Plan: 1.  Advised ice therapy, 10 minutes on 20 minutes off, 3-4 times throughout the day to help reduce pain and swelling. 2.  Advised take ibuprofen 600 mg every 8 hours with food to help reduce pain and swelling. 3.  Advised to take Zanaflex every 6 hours to help reduce muscle spasm and irritability. 4.  Advised to follow-up PCP or return to urgent care if symptoms fail to improve.  11/28/2021 [facility] per MD note: Patient is a 56 year old female who presents with neck pain that radiates down her right arm with some intermittent numbness and weakness in her right arm.  Currently I do not appreciate any neurologic deficits.  No recent trauma.  She does not have associated chest pain, abdominal pain or other symptoms that would be more concerning for vascular disease such as thoracic aneurysm/dissection.  Her blood pressure was noted to be low on recheck with a systolic in the 37J.  She does say that she took her Zanaflex this morning along with her Lotrel.  She also takes 30 mg of Restoril at night and 20 mg of Belsomra.  I suspect the combination of medications led to the low blood pressure.  She is not symptomatic with this.  There is no dizziness.  She had labs checked.  Her hemoglobin is okay.  Her creatinine is mildly elevated but on chart review is similar to her prior values.  I did review recent office visits and her blood pressure has been in the low low 100s.  We did check a blood pressures in both of her arms and its symmetric.  She was given a liter of IV fluids.  Her blood pressure improved to 112/72.  She was discharged home in good condition.  At this point I do not feel there is  an indication for hospitalization.  She was given symptomatic care instructions and given a prescription for short course of Vicodin.  She was advised to stop taking the Zanaflex.  She was advised to make sure she checks her blood pressure prior to taking her morning blood pressure medicine.  Return precautions were given.  She does have an appointment with her PCP later this month.  11/29/2021 [facility] per MD note: Natalie Edwards is here with neck pain.  Normal vitals.  No fever.  No significant medical history.  Overall differential diagnosis likely muscular process versus cervical radiculopathy.  Have no concern for meningitis or stroke or other acute neurologic process.  Reproducible pain on exam.  Intermittent symptoms.  We will give a dose IV fentanyl, IV Zofran, IV Decadron.  Will check basic labs and get a CT scan of the neck to evaluate for any obvious anatomical issues.     Per my review and interpretation of labs is no significant anemia, electrolyte abnormality, kidney injury.  CT imaging shows no acute fracture.  However patient does have degenerative changes throughout the cervical spine.  Moderate to severe areas of foraminal stenosis which I suspect is the cause of her discomfort.  We will put her on a Medrol Dosepak and have her follow-up with spine team.  Discharged in good condition.  Understands  return precautions.  Follow-Ups: Follow up with Meyran, Ocie Cornfield, NP (Neurosurgery); FOLLOW UP WITH SPINE TEAM  Today's visit 12/13/2021: Doing well on blood pressure medications without issues or concerns. She is no longer taking Zanaflex with blood pressure medications. Also, states Zanaflex makes her stomach ulcers flare. Established with Gastroenterology for management of abdominal pain/stomach ulcers. Established with Orthopedics for neck pain. She has not established with Neurosurgery as of present as recommended on 11/29/2021 at emergency  department discharge. Reports her shoulders hurt worse upon awakening in the morning and has a pinched nerve. She works in Water engineer at [employer]. No further issues or concerns for today.     Patient Active Problem List   Diagnosis Date Noted   Anxiety and depression 05/14/2021   Breathing difficult 05/14/2021   Upper respiratory tract infection 05/14/2021   GERD (gastroesophageal reflux disease)    Visit for routine gyn exam 01/14/2021   Screening mammogram, encounter for 01/14/2021   Possible exposure to STD 01/14/2021   Chronic insomnia 10/18/2019   CKD (chronic kidney disease) 04/07/2019   GAD (generalized anxiety disorder) 05/07/2018   Chronic gastric ulcer 05/07/2018   Essential hypertension 05/07/2018   Iron deficiency anemia due to chronic blood loss 09/23/2016   Peptic ulcer disease with hemorrhage 09/23/2016   Thrombocytosis 09/09/2016   Leukocytosis 09/09/2016   H/O ulcer disease 07/31/2016     Current Outpatient Medications on File Prior to Visit  Medication Sig Dispense Refill   benzonatate (TESSALON) 100 MG capsule Take 1 capsule (100 mg total) by mouth every 8 (eight) hours as needed for cough. 21 capsule 0   esomeprazole (NEXIUM) 40 MG capsule TAKE 1 CAPSULE BY MOUTH 2 TIMES DAILY 180 capsule 3   HYDROcodone-acetaminophen (NORCO/VICODIN) 5-325 MG tablet Take 2 tablets by mouth every 4 (four) hours as needed. 10 tablet 0   ibuprofen (ADVIL) 600 MG tablet Take 1 tablet (600 mg total) by mouth every 6 (six) hours as needed. 30 tablet 0   loratadine (CLARITIN) 10 MG tablet Take 1 tablet (10 mg total) by mouth daily. 90 tablet 1   methylPREDNISolone (MEDROL DOSEPAK) 4 MG TBPK tablet Follow package insert 21 each 0   mirtazapine (REMERON) 15 MG tablet Take 1 tablet by mouth at bedtime 90 tablet 2   Suvorexant (BELSOMRA) 20 MG TABS Take 20 mg by mouth at bedtime. 30 tablet 5   temazepam (RESTORIL) 30 MG capsule Take 1 capsule (30 mg total) by mouth at bedtime  as needed for sleep. 30 capsule 5   No current facility-administered medications on file prior to visit.    Allergies  Allergen Reactions   Hydroxyzine Other (See Comments)    Palpitations and jitteriness    Ambien [Zolpidem Tartrate] Other (See Comments)    Jitteriness, nervousness, abdominal pain    Social History   Socioeconomic History   Marital status: Single    Spouse name: Not on file   Number of children: 3   Years of education: Not on file   Highest education level: High school graduate  Occupational History   Not on file  Tobacco Use   Smoking status: Every Day    Packs/day: 0.25    Years: 25.00    Total pack years: 6.25    Types: Cigarettes   Smokeless tobacco: Never   Tobacco comments:    Pt tried to quit - helps with anxiety   Vaping Use   Vaping Use: Never used  Substance and Sexual Activity   Alcohol  use: Yes   Drug use: No   Sexual activity: Yes    Comment: hysterectomy  Other Topics Concern   Not on file  Social History Narrative   Lives at home in an apartment. Her mother lives with her.    Right handed   Caffeine: drinks approx. 36 oz of pepsi per day. Sometimes drinks 2 cups of coffee in a day as well.    Social Determinants of Health   Financial Resource Strain: Not on file  Food Insecurity: Not on file  Transportation Needs: Not on file  Physical Activity: Not on file  Stress: Not on file  Social Connections: Not on file  Intimate Partner Violence: Not on file    Family History  Problem Relation Age of Onset   Cancer Maternal Uncle        Lung   Cancer Maternal Uncle        Lung   Cancer Maternal Uncle        Lung   Headache Neg Hx        "I don't think so"   Migraines Neg Hx        "I don't think so"    Past Surgical History:  Procedure Laterality Date   ABDOMINAL HYSTERECTOMY     CHOLECYSTECTOMY      ROS: Review of Systems Negative except as stated above  PHYSICAL EXAM: BP 119/80   Pulse 76   Ht '5\' 3"'$  (1.6 m)    Wt 173 lb 12.8 oz (78.8 kg)   BMI 30.79 kg/m   Physical Exam HENT:     Head: Normocephalic and atraumatic.  Eyes:     Extraocular Movements: Extraocular movements intact.     Conjunctiva/sclera: Conjunctivae normal.     Pupils: Pupils are equal, round, and reactive to light.  Cardiovascular:     Rate and Rhythm: Normal rate and regular rhythm.     Pulses: Normal pulses.     Heart sounds: Normal heart sounds.  Pulmonary:     Effort: Pulmonary effort is normal.     Breath sounds: Normal breath sounds.  Musculoskeletal:     Cervical back: Normal range of motion and neck supple.  Neurological:     General: No focal deficit present.     Mental Status: She is alert and oriented to person, place, and time.  Psychiatric:        Mood and Affect: Mood normal.        Behavior: Behavior normal.      ASSESSMENT AND PLAN: 1. Primary hypertension - Continue Amlodipine-Benazepril and Hydrochlorothiazide as prescribed.  - Counseled on blood pressure goal of less than 130/80, low-sodium, DASH diet, medication compliance, and 150 minutes of moderate intensity exercise per week as tolerated. Counseled on medication adherence and adverse effects. - Follow-up with primary provider in 3 months or sooner if needed.  - amLODipine-benazepril (LOTREL) 10-40 MG capsule; TAKE 1 CAPSULE BY MOUTH DAILY  Dispense: 30 capsule; Refill: 2 - hydrochlorothiazide (HYDRODIURIL) 25 MG tablet; Take 1 tablet by mouth daily.  Dispense: 30 tablet; Refill: 2  2. Diabetes mellitus screening - Routine screening.  - Hemoglobin A1c  3. Screening cholesterol level - Routine screening.  - Lipid panel  4. Cervical radiculitis 5. Pinched nerve in neck - Referral to Neurosurgery for further evaluation and management.  - Ambulatory referral to Neurosurgery  6. Chronic neck pain - Keep all scheduled appointments with Orthopedics.   7. History of gastric ulcer 8. Chronic abdominal pain -  Keep all scheduled  appointments with Gastroenterology.    Patient was given the opportunity to ask questions.  Patient verbalized understanding of the plan and was able to repeat key elements of the plan. Patient was given clear instructions to go to Emergency Department or return to medical center if symptoms don't improve, worsen, or new problems develop.The patient verbalized understanding.   Orders Placed This Encounter  Procedures   Lipid panel   Hemoglobin A1c   Ambulatory referral to Neurosurgery     Requested Prescriptions   Signed Prescriptions Disp Refills   amLODipine-benazepril (LOTREL) 10-40 MG capsule 30 capsule 2    Sig: TAKE 1 CAPSULE BY MOUTH DAILY   hydrochlorothiazide (HYDRODIURIL) 25 MG tablet 30 tablet 2    Sig: Take 1 tablet by mouth daily.    Return in about 3 months (around 03/14/2022) for Follow-Up or next available chronic care mgmt with Dorna Mai, MD.  Camillia Herter, NP

## 2021-11-29 NOTE — ED Triage Notes (Signed)
Reports worsening neck pain that radiates to upper back, right arm and generalized right side pain.   Reports a dull sensation / right arm numbness x 3 days.   Says sx are the same as yesterdays visit but worse.

## 2021-11-29 NOTE — Discharge Instructions (Addendum)
Overall suspect that you are having nerve pain from arthritis in your neck.  Follow-up with spine team.  Take next dose of steroid tomorrow.  Take hydrocodone prescribed to yesterday as needed for pain.  Recommend 1000 mg of Tylenol every 6 hours as needed for pain.

## 2021-11-29 NOTE — ED Provider Notes (Signed)
[facility] Provider Note   CSN: [id number] Arrival date & time: 11/29/21  1017     History  Chief Complaint  Patient presents with   Neck Pain    Natalie Edwards is a 56 y.o. female.  Patient here with ongoing right-sided neck discomfort and spasm.  Tingling on the right arm.  Denies any weakness.  Some numbness in the tip of her fourth and fifth finger at times.  Denies any fevers or chills.  No cough or sputum production.  Denies any chest pain, shortness of breath, headache, vision changes.  Worse with movement.  Better with rest.  Feels tight and stiff along the right side of the neck and right upper shoulder area.  Denies any trauma or falls.  She was given muscle relaxant and narcotic pain medicine here over the last several days by other providers.  Denies history of diabetes.  The history is provided by the patient.       Home Medications Prior to Admission medications   Medication Sig Start Date End Date Taking? Authorizing Provider  methylPREDNISolone (MEDROL DOSEPAK) 4 MG TBPK tablet Follow package insert 11/29/21  Yes Harmoni Lucus, DO  amLODipine-benazepril (LOTREL) 10-40 MG capsule TAKE 1 CAPSULE BY MOUTH DAILY 05/14/21 05/14/22  Fenton Foy, NP  benzonatate (TESSALON) 100 MG capsule Take 1 capsule (100 mg total) by mouth every 8 (eight) hours as needed for cough. 05/29/21   Teodora Medici, FNP  esomeprazole (NEXIUM) 40 MG capsule TAKE 1 CAPSULE BY MOUTH 2 TIMES DAILY 05/14/21 05/14/22  Fenton Foy, NP  hydrochlorothiazide (HYDRODIURIL) 25 MG tablet Take 1 tablet by mouth daily. 05/23/21     HYDROcodone-acetaminophen (NORCO/VICODIN) 5-325 MG tablet Take 2 tablets by mouth every 4 (four) hours as needed. 11/28/21   Malvin Johns, MD  ibuprofen (ADVIL) 600 MG tablet Take 1 tablet (600 mg total) by mouth every 6 (six) hours as needed. 11/27/21   Nyoka Lint, PA-C  loratadine (CLARITIN) 10 MG tablet Take 1 tablet (10 mg total) by mouth daily.  08/18/19   Nicolette Bang, MD  mirtazapine (REMERON) 15 MG tablet Take 1 tablet by mouth at bedtime 02/14/21     Suvorexant (BELSOMRA) 20 MG TABS Take 20 mg by mouth at bedtime. 09/13/21   Olalere, Cicero Duck A, MD  temazepam (RESTORIL) 30 MG capsule Take 1 capsule (30 mg total) by mouth at bedtime as needed for sleep. 07/15/21   Laurin Coder, MD      Allergies    Hydroxyzine and Ambien [zolpidem tartrate]    Review of Systems   Review of Systems  Physical Exam Updated Vital Signs BP 118/79   Pulse 81   Temp 98.5 F (36.9 C) (Oral)   Resp 16   Ht '5\' 3"'$  (1.6 m)   Wt 70.8 kg   SpO2 98%   BMI 27.63 kg/m  Physical Exam Vitals and nursing note reviewed.  Constitutional:      General: She is not in acute distress.    Appearance: She is well-developed.  HENT:     Head: Normocephalic and atraumatic.     Nose: Nose normal.     Mouth/Throat:     Mouth: Mucous membranes are moist.  Eyes:     Extraocular Movements: Extraocular movements intact.     Conjunctiva/sclera: Conjunctivae normal.     Pupils: Pupils are equal, round, and reactive to light.  Neck:     Comments: No midline spinal tenderness, tenderness within the paraspinal  cervical muscles on the right as well as the right trapezius muscle Cardiovascular:     Rate and Rhythm: Normal rate and regular rhythm.     Pulses: Normal pulses.     Heart sounds: Normal heart sounds. No murmur heard. Pulmonary:     Effort: Pulmonary effort is normal. No respiratory distress.     Breath sounds: Normal breath sounds.  Abdominal:     General: Abdomen is flat.     Palpations: Abdomen is soft.     Tenderness: There is no abdominal tenderness.  Musculoskeletal:        General: No swelling.     Cervical back: Neck supple. Tenderness present.  Skin:    General: Skin is warm and dry.     Capillary Refill: Capillary refill takes less than 2 seconds.  Neurological:     General: No focal deficit present.     Mental Status: She  is alert and oriented to person, place, and time.     Sensory: No sensory deficit.     Motor: No weakness.     Comments: 5+ out of 5 strength throughout, normal sensation  Psychiatric:        Mood and Affect: Mood normal.     ED Results / Procedures / Treatments   Labs (all labs ordered are listed, but only abnormal results are displayed) Labs Reviewed  COMPREHENSIVE METABOLIC PANEL - Abnormal; Notable for the following components:      Result Value   AST 13 (*)    Total Bilirubin 0.2 (*)    All other components within normal limits  CBC WITH DIFFERENTIAL/PLATELET - Abnormal; Notable for the following components:   WBC 14.7 (*)    RDW 16.4 (*)    Platelets 403 (*)    All other components within normal limits    EKG None  Radiology CT Cervical Spine Wo Contrast  Result Date: 11/29/2021 CLINICAL DATA:  56 year old female with increasing neck pain radiating to the upper back, right arm. EXAM: CT CERVICAL SPINE WITHOUT CONTRAST TECHNIQUE: Multidetector CT imaging of the cervical spine was performed without intravenous contrast. Multiplanar CT image reconstructions were also generated. RADIATION DOSE REDUCTION: This exam was performed according to the departmental dose-optimization program which includes automated exposure control, adjustment of the mA and/or kV according to patient size and/or use of iterative reconstruction technique. COMPARISON:  Head CT 06/03/2018. FINDINGS: Alignment: Straightening and mild reversal of cervical lordosis. Cervicothoracic junction alignment is within normal limits. Bilateral posterior element alignment is within normal limits. Skull base and vertebrae: Bone mineralization is within normal limits. Visualized skull base is intact. No atlanto-occipital dissociation. C1 and C2 appear intact and aligned. No acute osseous abnormality identified. Soft tissues and spinal canal: No prevertebral fluid or swelling. No visible canal hematoma. Small volume retained  secretions in the visible pharynx but otherwise negative visible noncontrast neck soft tissues. Disc levels: C2-C3:  Negative. C3-C4:  Negative. C4-C5: Disc space loss. Circumferential disc osteophyte complex with a broad-based posterior component. Borderline to mild spinal stenosis. Mild left and up to moderate right C5 foraminal stenosis. C5-C6: Disc space loss with circumferential disc osteophyte complex. Broad-based posterior component. Borderline to mild spinal stenosis. Moderate to severe left and mild right C6 foraminal stenosis. C6-C7: Disc space loss. Circumferential disc osteophyte complex appears eccentric to the left. No convincing spinal stenosis. Moderate to severe left and mild right C7 foraminal stenosis. C7-T1: Negative; Bilateral C7 cervical ribs, normal variant. Upper chest: Visible upper thoracic levels appear intact.  Mild apical lung scarring. Other: Negative visible noncontrast posterior fossa. Visible tympanic cavities and mastoids are clear. IMPRESSION: 1. No acute osseous abnormality in the cervical spine. 2. Multilevel cervical disc and endplate degeneration. Up to mild spinal stenosis at C4-C5 and C5-C6. Moderate or severe neural foraminal stenosis at the left C6 and C7 nerve levels. Mild to moderate right C5, mild right C6 and C7 foraminal stenosis. 3. Bilateral C7 cervical ribs, normal variant. Electronically Signed   By: Genevie Ann M.D.   On: 11/29/2021 07:31    Procedures Procedures    Medications Ordered in ED Medications  fentaNYL (SUBLIMAZE) injection 50 mcg (50 mcg Intravenous Given 11/29/21 0647)  ondansetron (ZOFRAN) injection 4 mg (4 mg Intravenous Given 11/29/21 0647)  dexamethasone (DECADRON) injection 10 mg (10 mg Intravenous Given 11/29/21 1610)    ED Course/ Medical Decision Making/ A&P                           Medical Decision Making Amount and/or Complexity of Data Reviewed Labs: ordered. Radiology: ordered.  Risk Prescription drug management.   Shemia Bevel is here with neck pain.  Normal vitals.  No fever.  No significant medical history.  Overall differential diagnosis likely muscular process versus cervical radiculopathy.  Have no concern for meningitis or stroke or other acute neurologic process.  Reproducible pain on exam.  Intermittent symptoms.  We will give a dose IV fentanyl, IV Zofran, IV Decadron.  Will check basic labs and get a CT scan of the neck to evaluate for any obvious anatomical issues.    Per my review and interpretation of labs is no significant anemia, electrolyte abnormality, kidney injury.  CT imaging shows no acute fracture.  However patient does have degenerative changes throughout the cervical spine.  Moderate to severe areas of foraminal stenosis which I suspect is the cause of her discomfort.  We will put her on a Medrol Dosepak and have her follow-up with spine team.  Discharged in good condition.  Understands return precautions.        Final Clinical Impression(s) / ED Diagnoses Final diagnoses:  Cervical radiculitis    Rx / DC Orders ED Discharge Orders          Ordered    methylPREDNISolone (MEDROL DOSEPAK) 4 MG TBPK tablet        11/29/21 0735              Lennice Sites, DO 11/29/21 [phone]

## 2021-11-29 NOTE — ED Notes (Signed)
Pt now returned from [facility] via w/c

## 2021-11-29 NOTE — ED Notes (Signed)
Patient transported to CT via w/c at this time

## 2021-11-29 NOTE — ED Provider Triage Note (Signed)
Emergency Medicine Provider Triage Evaluation Note  Natalie Edwards , a 56 y.o. female  was evaluated in triage.  Pt complains of neck pain and right arm weakness/numbness.  Symptoms have been progressively worsening over the past 3 days.  No injury.  No pain into the mid or lower back.  No numbness/weakness in the legs.  No fevers.  No chest discomfort.  Pain radiates from the right neck into the shoulder.  Review of Systems  Positive: Neck pain with numbness/weakness Negative: Leg pain, CP, SOB, abdominal pain  Physical Exam  Ht '5\' 3"'$  (1.6 m)   Wt 70.8 kg   BMI 27.63 kg/m  Gen:   Awake, no distress; tearful.  Resp:  Normal effort  MSK:   Moves extremities without difficulty. Normal ROM of the right shoulder.  Other:  Decreased grip strength on the right.   Medical Decision Making  Medically screening exam initiated at 6:36 AM.  Appropriate orders placed.  Threasa Kinch was informed that the remainder of the evaluation will be completed by another provider, this initial triage assessment does not replace that evaluation, and the importance of remaining in the ED until their evaluation is complete.  Patient seen in the ED yesterday.  I have ordered CT C-spine, basic labs, pain medication.   Margette Fast, MD 11/29/21 [phone]

## 2021-12-02 DIAGNOSIS — M5412 Radiculopathy, cervical region: Secondary | ICD-10-CM | POA: Diagnosis not present

## 2021-12-03 ENCOUNTER — Telehealth: Payer: Self-pay

## 2021-12-03 NOTE — Telephone Encounter (Signed)
     Patient  visit on 9/12  at [facility] you been able to follow up with your primary care physician?yes  The patient was or was not able to obtain any needed medicine or equipment.yes  Are there diet recommendations that you are having difficulty following?na  Patient expresses understanding of discharge instructions and education provided has no other needs at this time.  yes   [facility], Care Management  [phone] [address], [place],  [address] Phone: [phone] Email: [email or web address]

## 2021-12-04 DIAGNOSIS — M542 Cervicalgia: Secondary | ICD-10-CM | POA: Diagnosis not present

## 2021-12-09 DIAGNOSIS — M542 Cervicalgia: Secondary | ICD-10-CM | POA: Diagnosis not present

## 2021-12-13 ENCOUNTER — Ambulatory Visit (INDEPENDENT_AMBULATORY_CARE_PROVIDER_SITE_OTHER): Payer: Medicare Other | Admitting: Family

## 2021-12-13 ENCOUNTER — Encounter: Payer: Self-pay | Admitting: Family

## 2021-12-13 VITALS — BP 119/80 | HR 76 | Ht 63.0 in | Wt 173.8 lb

## 2021-12-13 DIAGNOSIS — Z8711 Personal history of peptic ulcer disease: Secondary | ICD-10-CM

## 2021-12-13 DIAGNOSIS — Z13220 Encounter for screening for lipoid disorders: Secondary | ICD-10-CM

## 2021-12-13 DIAGNOSIS — M5412 Radiculopathy, cervical region: Secondary | ICD-10-CM | POA: Diagnosis not present

## 2021-12-13 DIAGNOSIS — M542 Cervicalgia: Secondary | ICD-10-CM

## 2021-12-13 DIAGNOSIS — Z131 Encounter for screening for diabetes mellitus: Secondary | ICD-10-CM

## 2021-12-13 DIAGNOSIS — G8929 Other chronic pain: Secondary | ICD-10-CM

## 2021-12-13 DIAGNOSIS — G589 Mononeuropathy, unspecified: Secondary | ICD-10-CM

## 2021-12-13 DIAGNOSIS — I10 Essential (primary) hypertension: Secondary | ICD-10-CM

## 2021-12-13 DIAGNOSIS — R109 Unspecified abdominal pain: Secondary | ICD-10-CM

## 2021-12-13 MED ORDER — HYDROCHLOROTHIAZIDE 25 MG PO TABS: 25.0000 mg | ORAL_TABLET | Freq: Every day | ORAL | 2 refills | Status: DC

## 2021-12-13 MED ORDER — AMLODIPINE BESY-BENAZEPRIL HCL 10-40 MG PO CAPS: 1.0000 | ORAL_CAPSULE | Freq: Every day | ORAL | 2 refills | Status: DC

## 2021-12-13 NOTE — Progress Notes (Signed)
Patient is states to needing medication refill. Patient also stated that she has been to the ED and also had a scan done as well(has report with her). Patient states they told her she had a pinched nerve.

## 2021-12-14 ENCOUNTER — Encounter: Payer: Self-pay | Admitting: Family

## 2021-12-14 ENCOUNTER — Other Ambulatory Visit: Payer: Self-pay | Admitting: Family

## 2021-12-14 DIAGNOSIS — E785 Hyperlipidemia, unspecified: Secondary | ICD-10-CM | POA: Insufficient documentation

## 2021-12-14 DIAGNOSIS — R7303 Prediabetes: Secondary | ICD-10-CM | POA: Insufficient documentation

## 2021-12-14 LAB — LIPID PANEL
Chol/HDL Ratio: 2.7 ratio (ref 0.0–4.4)
Cholesterol, Total: 202 mg/dL — ABNORMAL HIGH (ref 100–199)
HDL: 76 mg/dL (ref >39)
LDL Chol Calc (NIH): 106 mg/dL — ABNORMAL HIGH (ref 0–99)
Triglycerides: 113 mg/dL (ref 0–149)
VLDL Cholesterol Cal: 20 mg/dL (ref 5–40)

## 2021-12-14 LAB — HEMOGLOBIN A1C
Est. average glucose Bld gHb Est-mCnc: 117 mg/dL
Hgb A1c MFr Bld: 5.7 % — ABNORMAL HIGH (ref 4.8–5.6)

## 2021-12-14 MED ORDER — ATORVASTATIN CALCIUM 20 MG PO TABS: 20.0000 mg | ORAL_TABLET | Freq: Every day | ORAL | 2 refills | Status: DC

## 2021-12-16 ENCOUNTER — Telehealth: Payer: Self-pay

## 2021-12-16 NOTE — Telephone Encounter (Signed)
Pt was called and no vm was left due to mailbox being full. Information has been sent to nurse pool.  

## 2021-12-16 NOTE — Telephone Encounter (Signed)
-----   Message from Camillia Herter, NP sent at 12/14/2021 11:27 AM EDT ----- - Cholesterol higher than expected. High cholesterol may increase risk of heart attack and/or stroke. Consider eating more fruits, vegetables, and lean baked meats such as chicken or fish. Moderate intensity exercise at least 150 minutes as tolerated per week may help as well. Trial Atorvastatin for high cholesterol. Please call our office to schedule lab appointment to recheck fasting cholesterol in 4 to 6 weeks. - Hemoglobin A1c is consistent with prediabetes. Practice healthy eating habits of fresh fruit and vegetables, lean baked meats such as chicken, fish, and [place]; limit breads, rice, pastas, and desserts; practice regular aerobic exercise (at least 150 minutes a week as tolerated). No medication needed as of present. Recheck in 6 months.  The following is for provider reference only: The 10-year ASCVD risk score (Arnett DK, et al., 2019) is: 6.8%   Values used to calculate the score:     Age: 56 years     Sex: Female     Is Non-Hispanic African American: Yes     Diabetic: No     Tobacco smoker: Yes     Systolic Blood Pressure: 384 mmHg     Is BP treated: Yes     HDL Cholesterol: 76 mg/dL     Total Cholesterol: 202 mg/dL

## 2021-12-23 ENCOUNTER — Telehealth: Payer: Self-pay | Admitting: Family Medicine

## 2021-12-23 NOTE — Telephone Encounter (Signed)
Copied from [place] [id number]. Topic: General - Other >> Dec 23, 2021 11:41 AM Everette C wrote: Reason for CRM: Kiana with [employer] has called to follow up on a previously submitted requisition form for comprehensive neurology originally submitted on 12/11/21  Please contact with additional information when possible

## 2021-12-23 NOTE — Telephone Encounter (Signed)
Unaware of any paperwork from [employer] patient was seen by Minette Brine for follow-up HTN visit, nurse out of office on 09/27 no paperwork in provider bin

## 2021-12-24 ENCOUNTER — Telehealth: Payer: Self-pay | Admitting: Gastroenterology

## 2021-12-24 ENCOUNTER — Other Ambulatory Visit (HOSPITAL_COMMUNITY): Payer: Self-pay

## 2021-12-24 ENCOUNTER — Telehealth: Payer: Self-pay

## 2021-12-24 ENCOUNTER — Ambulatory Visit: Payer: Medicare Other | Admitting: Gastroenterology

## 2021-12-24 MED ORDER — ESOMEPRAZOLE MAGNESIUM 40 MG PO CPDR: 40.0000 mg | DELAYED_RELEASE_CAPSULE | Freq: Two times a day (BID) | ORAL | 0 refills | Status: DC

## 2021-12-24 NOTE — Telephone Encounter (Signed)
Received notification from [facility] regarding a prior authorization for ESOMEPRAZOLE MAGNESIUM '40MG'$  CAP. Authorization has been APPROVED from 12/24/2021 until further notice per approval letter.    Per test claim, copay for 90 days supply is $4.15  Key: [id number]

## 2021-12-24 NOTE — Telephone Encounter (Signed)
Patient had to cancel today due to Dr. Tarri Glenn being out sick and the patient is requesting and in need of her Nexium medication. Said she has been out of it for a while now and needs it please advise.

## 2021-12-24 NOTE — Telephone Encounter (Signed)
Refill sent to patient pharmacy. Patient informed.

## 2021-12-24 NOTE — Telephone Encounter (Signed)
Submitted a Prior Authorization request to [facility] for ESOMEPRAZOLE MAGNESIUM '40MG'$  CAPSULES via CoverMyMeds. Will update once we receive a response.   Key: [id number] PA Case ID: [id number]

## 2021-12-25 NOTE — Telephone Encounter (Signed)
Natalie Edwards with [employer] is resending the paperwork that needs to be completed.

## 2021-12-26 NOTE — Telephone Encounter (Signed)
Patient was called and asked about [employer] screening requisition.  Patient is not familiar with [employer] and would like to look company up before anything is sent in.  CMA will keep paper in her folder.

## 2021-12-27 ENCOUNTER — Ambulatory Visit: Payer: Medicare Other | Admitting: Physician Assistant

## 2021-12-30 DIAGNOSIS — Z6829 Body mass index (BMI) 29.0-29.9, adult: Secondary | ICD-10-CM | POA: Diagnosis not present

## 2021-12-30 DIAGNOSIS — M4722 Other spondylosis with radiculopathy, cervical region: Secondary | ICD-10-CM | POA: Diagnosis not present

## 2022-01-01 ENCOUNTER — Telehealth: Payer: Self-pay | Admitting: Pulmonary Disease

## 2022-01-01 NOTE — Telephone Encounter (Signed)
Patient has office visit on 01/21/22 with Dr Jenetta Downer. Patient is wanting to know if she can get her belsomra and Restoril refilled before then. She states she is out of refills.   Please advise Dr Jenetta Downer   Thank you

## 2022-01-02 ENCOUNTER — Other Ambulatory Visit: Payer: Self-pay | Admitting: Pulmonary Disease

## 2022-01-02 MED ORDER — TEMAZEPAM 30 MG PO CAPS: 30.0000 mg | ORAL_CAPSULE | Freq: Every evening | ORAL | 5 refills | Status: DC | PRN

## 2022-01-02 MED ORDER — BELSOMRA 20 MG PO TABS: 20.0000 mg | ORAL_TABLET | Freq: Every evening | ORAL | 5 refills | Status: DC

## 2022-01-02 NOTE — Telephone Encounter (Signed)
Meds renewed.

## 2022-01-02 NOTE — Telephone Encounter (Signed)
Called and spoke with patient. She verbalized understanding.   Nothing further needed.  

## 2022-01-09 ENCOUNTER — Other Ambulatory Visit: Payer: Self-pay | Admitting: Obstetrics and Gynecology

## 2022-01-09 DIAGNOSIS — Z1231 Encounter for screening mammogram for malignant neoplasm of breast: Secondary | ICD-10-CM

## 2022-01-10 ENCOUNTER — Other Ambulatory Visit (HOSPITAL_COMMUNITY): Payer: Self-pay | Admitting: Surgical

## 2022-01-10 DIAGNOSIS — M542 Cervicalgia: Secondary | ICD-10-CM | POA: Diagnosis not present

## 2022-01-10 DIAGNOSIS — M5412 Radiculopathy, cervical region: Secondary | ICD-10-CM | POA: Diagnosis not present

## 2022-01-10 MED ORDER — GABAPENTIN 100 MG PO CAPS: 100.0000 mg | ORAL_CAPSULE | Freq: Every day | ORAL | 2 refills | Status: DC

## 2022-01-21 ENCOUNTER — Ambulatory Visit: Payer: Medicare Other | Admitting: Pulmonary Disease

## 2022-01-28 NOTE — Progress Notes (Deleted)
Patient ID: Natalie Edwards, female    DOB: 23-Aug-1965  MRN: [id number]  CC: No chief complaint on file.   Subjective: Natalie Edwards is a 56 y.o. female who presents for  Her concerns today include:   discuss medication to help w/ passing of parent  Patient Active Problem List   Diagnosis Date Noted   Hyperlipidemia 12/14/2021   Prediabetes 12/14/2021   Anxiety and depression 05/14/2021   Breathing difficult 05/14/2021   Upper respiratory tract infection 05/14/2021   GERD (gastroesophageal reflux disease)    Visit for routine gyn exam 01/14/2021   Screening mammogram, encounter for 01/14/2021   Possible exposure to STD 01/14/2021   Chronic insomnia 10/18/2019   CKD (chronic kidney disease) 04/07/2019   GAD (generalized anxiety disorder) 05/07/2018   Chronic gastric ulcer 05/07/2018   Essential hypertension 05/07/2018   Iron deficiency anemia due to chronic blood loss 09/23/2016   Peptic ulcer disease with hemorrhage 09/23/2016   Thrombocytosis 09/09/2016   Leukocytosis 09/09/2016   H/O ulcer disease 07/31/2016     Current Outpatient Medications on File Prior to Visit  Medication Sig Dispense Refill   amLODipine-benazepril (LOTREL) 10-40 MG capsule TAKE 1 CAPSULE BY MOUTH DAILY 30 capsule 2   atorvastatin (LIPITOR) 20 MG tablet Take 1 tablet (20 mg total) by mouth daily. 30 tablet 2   benzonatate (TESSALON) 100 MG capsule Take 1 capsule (100 mg total) by mouth every 8 (eight) hours as needed for cough. 21 capsule 0   esomeprazole (NEXIUM) 40 MG capsule Take 1 capsule (40 mg total) by mouth 2 (two) times daily. 180 capsule 0   gabapentin (NEURONTIN) 100 MG capsule Take 1 capsule (100 mg total) by mouth at bedtime. For one week. Then take 2 capsules at bedtime for 1 week. Then take 3 capsules at bedtime for 1 week. Continue with 3 capsules at night moving forward. 30 capsule 2   hydrochlorothiazide (HYDRODIURIL) 25 MG tablet Take 1 tablet by mouth daily. 30 tablet 2    HYDROcodone-acetaminophen (NORCO/VICODIN) 5-325 MG tablet Take 2 tablets by mouth every 4 (four) hours as needed. 10 tablet 0   ibuprofen (ADVIL) 600 MG tablet Take 1 tablet (600 mg total) by mouth every 6 (six) hours as needed. 30 tablet 0   loratadine (CLARITIN) 10 MG tablet Take 1 tablet (10 mg total) by mouth daily. 90 tablet 1   methylPREDNISolone (MEDROL DOSEPAK) 4 MG TBPK tablet Follow package insert 21 each 0   mirtazapine (REMERON) 15 MG tablet Take 1 tablet by mouth at bedtime 90 tablet 2   Suvorexant (BELSOMRA) 20 MG TABS Take 20 mg by mouth at bedtime. 30 tablet 5   temazepam (RESTORIL) 30 MG capsule Take 1 capsule (30 mg total) by mouth at bedtime as needed for sleep. 30 capsule 5   No current facility-administered medications on file prior to visit.    Allergies  Allergen Reactions   Hydroxyzine Other (See Comments)    Palpitations and jitteriness    Ambien [Zolpidem Tartrate] Other (See Comments)    Jitteriness, nervousness, abdominal pain    Social History   Socioeconomic History   Marital status: Single    Spouse name: Not on file   Number of children: 3   Years of education: Not on file   Highest education level: High school graduate  Occupational History   Not on file  Tobacco Use   Smoking status: Every Day    Packs/day: 0.25    Years: 25.00  Total pack years: 6.25    Types: Cigarettes   Smokeless tobacco: Never   Tobacco comments:    Pt tried to quit - helps with anxiety   Vaping Use   Vaping Use: Never used  Substance and Sexual Activity   Alcohol use: Yes   Drug use: No   Sexual activity: Yes    Comment: hysterectomy  Other Topics Concern   Not on file  Social History Narrative   Lives at home in an apartment. Her mother lives with her.    Right handed   Caffeine: drinks approx. 36 oz of pepsi per day. Sometimes drinks 2 cups of coffee in a day as well.    Social Determinants of Health   Financial Resource Strain: Not on file  Food  Insecurity: Not on file  Transportation Needs: Not on file  Physical Activity: Not on file  Stress: Not on file  Social Connections: Not on file  Intimate Partner Violence: Not on file    Family History  Problem Relation Age of Onset   Cancer Maternal Uncle        Lung   Cancer Maternal Uncle        Lung   Cancer Maternal Uncle        Lung   Headache Neg Hx        "I don't think so"   Migraines Neg Hx        "I don't think so"    Past Surgical History:  Procedure Laterality Date   ABDOMINAL HYSTERECTOMY     CHOLECYSTECTOMY      ROS: Review of Systems Negative except as stated above  PHYSICAL EXAM: There were no vitals taken for this visit.  Physical Exam  {female adult master:310786} {female adult master:310785}     Latest Ref Rng & Units 11/29/2021    6:41 AM 11/28/2021    8:07 AM 05/03/2019    3:11 PM  CMP  Glucose 70 - 99 mg/dL 97  94  101   BUN 6 - 20 mg/dL '11  20  26   '$ Creatinine 0.44 - 1.00 mg/dL 0.96  1.25  1.25   Sodium 135 - 145 mmol/L 141  136  138   Potassium 3.5 - 5.1 mmol/L 4.1  4.0  4.8   Chloride 98 - 111 mmol/L 108  104  107   CO2 22 - 32 mmol/L '23  24  26   '$ Calcium 8.9 - 10.3 mg/dL 9.4  9.3  10.1   Total Protein 6.5 - 8.1 g/dL 7.5   8.3   Total Bilirubin 0.3 - 1.2 mg/dL 0.2   0.2   Alkaline Phos 38 - 126 U/L 50   73   AST 15 - 41 U/L 13   12   ALT 0 - 44 U/L 11   14    Lipid Panel     Component Value Date/Time   CHOL 202 (H) 12/13/2021 1502   TRIG 113 12/13/2021 1502   HDL 76 12/13/2021 1502   CHOLHDL 2.7 12/13/2021 1502   LDLCALC 106 (H) 12/13/2021 1502    CBC    Component Value Date/Time   WBC 14.7 (H) 11/29/2021 0641   RBC 4.12 11/29/2021 0641   HGB 12.2 11/29/2021 0641   HGB 12.3 03/08/2019 0738   HGB 11.7 12/06/2018 0914   HGB 11.4 (L) 09/09/2016 1329   HCT 36.8 11/29/2021 0641   HCT 36.6 12/06/2018 0914   HCT 35.6 09/09/2016 1329   PLT  403 (H) 11/29/2021 0641   PLT 493 (H) 03/08/2019 0738   PLT 463 (H) 12/06/2018  0914   MCV 89.3 11/29/2021 0641   MCV 88 12/06/2018 0914   MCV 80.5 09/09/2016 1329   MCH 29.6 11/29/2021 0641   MCHC 33.2 11/29/2021 0641   RDW 16.4 (H) 11/29/2021 0641   RDW 15.9 (H) 12/06/2018 0914   RDW 16.8 (H) 09/09/2016 1329   LYMPHSABS 6.2 (H) 11/29/2021 0641   LYMPHSABS 5.2 (H) 12/06/2018 0914   LYMPHSABS 5.9 (H) 09/09/2016 1329   MONOABS 1.0 11/29/2021 0641   MONOABS 1.0 (H) 09/09/2016 1329   EOSABS 0.3 11/29/2021 0641   EOSABS 0.1 12/06/2018 0914   BASOSABS 0.0 11/29/2021 0641   BASOSABS 0.1 12/06/2018 0914   BASOSABS 0.0 09/09/2016 1329    ASSESSMENT AND PLAN:  There are no diagnoses linked to this encounter.   Patient was given the opportunity to ask questions.  Patient verbalized understanding of the plan and was able to repeat key elements of the plan. Patient was given clear instructions to go to Emergency Department or return to medical center if symptoms don't improve, worsen, or new problems develop.The patient verbalized understanding.   No orders of the defined types were placed in this encounter.    Requested Prescriptions    No prescriptions requested or ordered in this encounter    No follow-ups on file.  Camillia Herter, NP

## 2022-01-30 ENCOUNTER — Ambulatory Visit (INDEPENDENT_AMBULATORY_CARE_PROVIDER_SITE_OTHER): Payer: Medicare Other | Admitting: Pulmonary Disease

## 2022-01-30 ENCOUNTER — Encounter: Payer: Self-pay | Admitting: Pulmonary Disease

## 2022-01-30 VITALS — BP 132/84 | HR 104 | Temp 98.4°F | Ht 63.0 in | Wt 163.4 lb

## 2022-01-30 DIAGNOSIS — F5104 Psychophysiologic insomnia: Secondary | ICD-10-CM

## 2022-01-30 MED ORDER — ZOLPIDEM TARTRATE ER 12.5 MG PO TBCR: 12.5000 mg | EXTENDED_RELEASE_TABLET | Freq: Every evening | ORAL | 0 refills | Status: DC | PRN

## 2022-01-30 NOTE — Progress Notes (Signed)
Subjective:    Patient ID: Natalie Edwards, female    DOB: 04/15/1965, 56 y.o.   MRN: [id number]  Patient with a history of chronic insomnia Recently lost her mother  Patient with chronic insomnia, difficult to treat Has been doing poorly since her mom passed for about 2 weeks now  Her usual medications have not been helping out much because of the stress of losing her mom  Evaluated in the past about 5 years ago  Restoril has been helping-uses 30 mg nightly -Runs out of medications, Uses Belsomra in addition  Using both medications have resulted in getting about 6 hours of sleep Does not wake up feeling groggy Feels she gets good enough sleep, when she wakes up in the middle of the night she is able to go back to sleep  She is doing the third shift at present There has been no change in schedule, this has helped her sleep a little bit Still with some difficulty falling asleep and maintaining sleep  Medication appears to be working well  She does have a history of snoring, no witnessed apneas She is only sleepy during the day when she does not get adequate rest at night Occasional dryness of her mouth in the mornings She does have headaches  No family history of sleep disordered breathing  She is an active smoker, a pack of cigarettes lasts her about 3 days Continues to try to quit, not able to at present  Has a lot of stressors     Continues to smoke actively Past Medical History:  Diagnosis Date   Allergy    Anemia    Diverticulosis    Gall bladder stones 1993   Gastric ulcer    GERD (gastroesophageal reflux disease)    Hypertension    Renal mass    Social History   Socioeconomic History   Marital status: Single    Spouse name: Not on file   Number of children: 3   Years of education: Not on file   Highest education level: High school graduate  Occupational History   Not on file  Tobacco Use   Smoking status: Every Day    Packs/day: 0.25    Years:  25.00    Total pack years: 6.25    Types: Cigarettes   Smokeless tobacco: Never   Tobacco comments:    Pt tried to quit - helps with anxiety   Vaping Use   Vaping Use: Never used  Substance and Sexual Activity   Alcohol use: Yes   Drug use: No   Sexual activity: Yes    Comment: hysterectomy  Other Topics Concern   Not on file  Social History Narrative   Lives at home in an apartment. Her mother lives with her.    Right handed   Caffeine: drinks approx. 36 oz of pepsi per day. Sometimes drinks 2 cups of coffee in a day as well.    Social Determinants of Health   Financial Resource Strain: Not on file  Food Insecurity: Not on file  Transportation Needs: Not on file  Physical Activity: Not on file  Stress: Not on file  Social Connections: Not on file  Intimate Partner Violence: Not on file   Family History  Problem Relation Age of Onset   Cancer Maternal Uncle        Lung   Cancer Maternal Uncle        Lung   Cancer Maternal Uncle  Lung   Headache Neg Hx        "I don't think so"   Migraines Neg Hx        "I don't think so"   Review of Systems  Constitutional:  Negative for fever and unexpected weight change.  HENT:  Negative for congestion, dental problem, ear pain, nosebleeds, postnasal drip, rhinorrhea, sinus pressure, sneezing, sore throat and trouble swallowing.   Eyes:  Negative for redness and itching.  Respiratory: Negative.  Negative for apnea.   Cardiovascular: Negative.   Gastrointestinal: Negative.   Genitourinary:  Negative for dysuria.  Musculoskeletal:  Negative for joint swelling.  Skin:  Negative for rash.  Allergic/Immunologic: Negative.  Negative for environmental allergies, food allergies and immunocompromised state.  Neurological:  Negative for headaches.  Hematological:  Does not bruise/bleed easily.  Psychiatric/Behavioral:  Negative for dysphoric mood. The patient is nervous/anxious.       Objective:   Physical  Exam Constitutional:      Appearance: Normal appearance.  HENT:     Head: Normocephalic.     Mouth/Throat:     Mouth: Mucous membranes are moist.     Comments: Mallampati 1, Eyes:     General:        Right eye: No discharge.        Left eye: No discharge.  Cardiovascular:     Rate and Rhythm: Normal rate and regular rhythm.     Pulses: Normal pulses.     Heart sounds: No murmur heard.    No friction rub.  Pulmonary:     Effort: Pulmonary effort is normal. No respiratory distress.     Breath sounds: No stridor. No wheezing or rhonchi.  Musculoskeletal:     Cervical back: No rigidity or tenderness.  Neurological:     Mental Status: She is alert.  Psychiatric:        Mood and Affect: Mood normal.    Vitals:   01/30/22 1318  BP: 132/84  Pulse: (!) 104  Temp: 98.4 F (36.9 C)  SpO2: 98%       Assessment & Plan:   Patient with sleep onset and sleep maintenance insomnia -Was on Restoril and Belsomra  Unfortunately lost her mother recently and she notes that she is not able to sleep at all with distress Usual medications have not been helping  She will like to try Ambien -I did let her know about the risk with multiple sleep aids at the same time but she reports not sleeping at all and with all the stress and exhaustion feels she may come to her without help  She was functioning relatively well prior to the added on stress  History of depression and anxiety -Continue to optimize treatment  Nicotine addiction -She continues to work on quitting smoking  She is aware to monitor symptoms that may be associated with multiple sleep aids  Plan: Prescription for Ambien sent in for 14 days  Encouraged to call with any significant concerns  Smoking cessation encouraged  Stimulus control as able  Follow-up in about 6 months  Most significant risk is sleepiness, waking up groggy, reviewed extensively

## 2022-01-30 NOTE — Patient Instructions (Addendum)
Prescription for Ambien sent into pharmacy for you for 14 days  Try and allow 6 to 8 hours for the medication to be out of your system so it does not cause significant grogginess during the day  Continue other medications  Wish you well with the grieving process  I will see you back in about 6 months

## 2022-01-31 ENCOUNTER — Telehealth: Payer: Self-pay

## 2022-01-31 NOTE — Telephone Encounter (Signed)
PA for Zolpidem Tartrate ER 12.'5MG'$  er tablets has been APPROVED from 01/31/2022 until further notice under the [facility] plan.

## 2022-01-31 NOTE — Telephone Encounter (Signed)
PA request received through [facility] for Zolpidem Tartrate ER 12.'5MG'$  er tablets from [facility].  PA has been submitted and is awaiting determination.  Key: [id number] - PA Case ID: [id number]

## 2022-02-03 ENCOUNTER — Ambulatory Visit: Payer: Medicare Other | Admitting: Obstetrics and Gynecology

## 2022-02-03 ENCOUNTER — Ambulatory Visit: Payer: Medicare Other | Admitting: Family Medicine

## 2022-02-24 ENCOUNTER — Telehealth: Payer: Self-pay

## 2022-02-24 NOTE — Telephone Encounter (Signed)
Spoke with [facility] who states confusion on what sleeping aid pt should be on. Pt requested refills on both temazepam and Belsomra at the same time. Dr. Ander Slade can you please advise on which medication pt should be taking? Thank you

## 2022-02-25 NOTE — Telephone Encounter (Signed)
Please advise sir on question from Estill Bamberg about medication for patient please and thank you

## 2022-02-25 NOTE — Telephone Encounter (Signed)
Patient checking on message for sleep medicine. Patient phone number is [phone].

## 2022-02-26 NOTE — Telephone Encounter (Signed)
Patient alternates Belsomra and temazepam.  She had stated when she is on one for a while, it will stop working and then tries to take the other medication to help her sleep.  She is aware not to combine medications because of the risks with respiratory depression.

## 2022-02-26 NOTE — Telephone Encounter (Signed)
Called and spoke with patient. She stated that it was the pharmacy that was questioning her medications. She provided them with a copy of her last OV note and they were able to fill both prescriptions.   Nothing further needed at time of call.

## 2022-03-03 ENCOUNTER — Ambulatory Visit (INDEPENDENT_AMBULATORY_CARE_PROVIDER_SITE_OTHER): Payer: Medicare Other | Admitting: Family Medicine

## 2022-03-03 VITALS — BP 113/73 | HR 77 | Temp 98.1°F | Resp 16 | Wt 164.8 lb

## 2022-03-03 DIAGNOSIS — Z0289 Encounter for other administrative examinations: Secondary | ICD-10-CM | POA: Diagnosis not present

## 2022-03-03 DIAGNOSIS — F17210 Nicotine dependence, cigarettes, uncomplicated: Secondary | ICD-10-CM

## 2022-03-03 DIAGNOSIS — F419 Anxiety disorder, unspecified: Secondary | ICD-10-CM

## 2022-03-03 DIAGNOSIS — F32A Depression, unspecified: Secondary | ICD-10-CM | POA: Diagnosis not present

## 2022-03-03 MED ORDER — MIRTAZAPINE 15 MG PO TABS: 15.0000 mg | ORAL_TABLET | Freq: Every day | ORAL | 1 refills | Status: DC

## 2022-03-03 NOTE — Progress Notes (Unsigned)
Patient is here for their 3/6 month follow-up Patient has no concerns today Care gaps have been discussed with patient  

## 2022-03-03 NOTE — Progress Notes (Unsigned)
Patient ID: Natalie Edwards, female    DOB: 03-Nov-1965  MRN: [id number]  CC: Follow-up   Subjective: Natalie Edwards is a 56 y.o. female who presents for Her concerns today include: ***  Patient Active Problem List   Diagnosis Date Noted   Hyperlipidemia 12/14/2021   Prediabetes 12/14/2021   Anxiety and depression 05/14/2021   Breathing difficult 05/14/2021   Upper respiratory tract infection 05/14/2021   GERD (gastroesophageal reflux disease)    Visit for routine gyn exam 01/14/2021   Screening mammogram, encounter for 01/14/2021   Possible exposure to STD 01/14/2021   Chronic insomnia 10/18/2019   CKD (chronic kidney disease) 04/07/2019   GAD (generalized anxiety disorder) 05/07/2018   Chronic gastric ulcer 05/07/2018   Essential hypertension 05/07/2018   Iron deficiency anemia due to chronic blood loss 09/23/2016   Peptic ulcer disease with hemorrhage 09/23/2016   Thrombocytosis 09/09/2016   Leukocytosis 09/09/2016   H/O ulcer disease 07/31/2016     Current Outpatient Medications on File Prior to Visit  Medication Sig Dispense Refill   amLODipine-benazepril (LOTREL) 10-40 MG capsule TAKE 1 CAPSULE BY MOUTH DAILY 30 capsule 2   atorvastatin (LIPITOR) 20 MG tablet Take 1 tablet (20 mg total) by mouth daily. 30 tablet 2   benzonatate (TESSALON) 100 MG capsule Take 1 capsule (100 mg total) by mouth every 8 (eight) hours as needed for cough. (Patient not taking: Reported on 01/30/2022) 21 capsule 0   esomeprazole (NEXIUM) 40 MG capsule Take 1 capsule (40 mg total) by mouth 2 (two) times daily. 180 capsule 0   gabapentin (NEURONTIN) 100 MG capsule Take 1 capsule (100 mg total) by mouth at bedtime. For one week. Then take 2 capsules at bedtime for 1 week. Then take 3 capsules at bedtime for 1 week. Continue with 3 capsules at night moving forward. (Patient not taking: Reported on 01/30/2022) 30 capsule 2   hydrochlorothiazide (HYDRODIURIL) 25 MG tablet Take 1 tablet by mouth daily. 30  tablet 2   HYDROcodone-acetaminophen (NORCO/VICODIN) 5-325 MG tablet Take 2 tablets by mouth every 4 (four) hours as needed. (Patient not taking: Reported on 01/30/2022) 10 tablet 0   ibuprofen (ADVIL) 600 MG tablet Take 1 tablet (600 mg total) by mouth every 6 (six) hours as needed. (Patient not taking: Reported on 01/30/2022) 30 tablet 0   loratadine (CLARITIN) 10 MG tablet Take 1 tablet (10 mg total) by mouth daily. 90 tablet 1   methylPREDNISolone (MEDROL DOSEPAK) 4 MG TBPK tablet Follow package insert (Patient not taking: Reported on 01/30/2022) 21 each 0   mirtazapine (REMERON) 15 MG tablet Take 1 tablet by mouth at bedtime (Patient not taking: Reported on 01/30/2022) 90 tablet 2   Suvorexant (BELSOMRA) 20 MG TABS Take 20 mg by mouth at bedtime. (Patient not taking: Reported on 01/30/2022) 30 tablet 5   temazepam (RESTORIL) 30 MG capsule Take 1 capsule (30 mg total) by mouth at bedtime as needed for sleep. (Patient not taking: Reported on 01/30/2022) 30 capsule 5   zolpidem (AMBIEN CR) 12.5 MG CR tablet Take 1 tablet (12.5 mg total) by mouth at bedtime as needed for sleep. 14 tablet 0   No current facility-administered medications on file prior to visit.    Allergies  Allergen Reactions   Hydroxyzine Other (See Comments)    Palpitations and jitteriness    Ambien [Zolpidem Tartrate] Other (See Comments)    Jitteriness, nervousness, abdominal pain    Social History   Socioeconomic History   Marital status:  Single    Spouse name: Not on file   Number of children: 3   Years of education: Not on file   Highest education level: High school graduate  Occupational History   Not on file  Tobacco Use   Smoking status: Every Day    Packs/day: 0.25    Years: 25.00    Total pack years: 6.25    Types: Cigarettes   Smokeless tobacco: Never   Tobacco comments:    Pt tried to quit - helps with anxiety   Vaping Use   Vaping Use: Never used  Substance and Sexual Activity   Alcohol use:  Yes   Drug use: No   Sexual activity: Yes    Comment: hysterectomy  Other Topics Concern   Not on file  Social History Narrative   Lives at home in an apartment. Her mother lives with her.    Right handed   Caffeine: drinks approx. 36 oz of pepsi per day. Sometimes drinks 2 cups of coffee in a day as well.    Social Determinants of Health   Financial Resource Strain: Not on file  Food Insecurity: Not on file  Transportation Needs: Not on file  Physical Activity: Not on file  Stress: Not on file  Social Connections: Not on file  Intimate Partner Violence: Not on file    Family History  Problem Relation Age of Onset   Cancer Maternal Uncle        Lung   Cancer Maternal Uncle        Lung   Cancer Maternal Uncle        Lung   Headache Neg Hx        "I don't think so"   Migraines Neg Hx        "I don't think so"    Past Surgical History:  Procedure Laterality Date   ABDOMINAL HYSTERECTOMY     CHOLECYSTECTOMY      ROS: Review of Systems Negative except as stated above  PHYSICAL EXAM: BP 113/73   Pulse 77   Temp 98.1 F (36.7 C) (Oral)   Resp 16   Wt 164 lb 12.8 oz (74.8 kg)   SpO2 95%   BMI 29.19 kg/m   Physical Exam  {female adult master:310786} {female adult master:310785}     Latest Ref Rng & Units 11/29/2021    6:41 AM 11/28/2021    8:07 AM 05/03/2019    3:11 PM  CMP  Glucose 70 - 99 mg/dL 97  94  101   BUN 6 - 20 mg/dL '11  20  26   '$ Creatinine 0.44 - 1.00 mg/dL 0.96  1.25  1.25   Sodium 135 - 145 mmol/L 141  136  138   Potassium 3.5 - 5.1 mmol/L 4.1  4.0  4.8   Chloride 98 - 111 mmol/L 108  104  107   CO2 22 - 32 mmol/L '23  24  26   '$ Calcium 8.9 - 10.3 mg/dL 9.4  9.3  10.1   Total Protein 6.5 - 8.1 g/dL 7.5   8.3   Total Bilirubin 0.3 - 1.2 mg/dL 0.2   0.2   Alkaline Phos 38 - 126 U/L 50   73   AST 15 - 41 U/L 13   12   ALT 0 - 44 U/L 11   14    Lipid Panel     Component Value Date/Time   CHOL 202 (H) 12/13/2021 1502   TRIG 113  12/13/2021  1502   HDL 76 12/13/2021 1502   CHOLHDL 2.7 12/13/2021 1502   LDLCALC 106 (H) 12/13/2021 1502    CBC    Component Value Date/Time   WBC 14.7 (H) 11/29/2021 0641   RBC 4.12 11/29/2021 0641   HGB 12.2 11/29/2021 0641   HGB 12.3 03/08/2019 0738   HGB 11.7 12/06/2018 0914   HGB 11.4 (L) 09/09/2016 1329   HCT 36.8 11/29/2021 0641   HCT 36.6 12/06/2018 0914   HCT 35.6 09/09/2016 1329   PLT 403 (H) 11/29/2021 0641   PLT 493 (H) 03/08/2019 0738   PLT 463 (H) 12/06/2018 0914   MCV 89.3 11/29/2021 0641   MCV 88 12/06/2018 0914   MCV 80.5 09/09/2016 1329   MCH 29.6 11/29/2021 0641   MCHC 33.2 11/29/2021 0641   RDW 16.4 (H) 11/29/2021 0641   RDW 15.9 (H) 12/06/2018 0914   RDW 16.8 (H) 09/09/2016 1329   LYMPHSABS 6.2 (H) 11/29/2021 0641   LYMPHSABS 5.2 (H) 12/06/2018 0914   LYMPHSABS 5.9 (H) 09/09/2016 1329   MONOABS 1.0 11/29/2021 0641   MONOABS 1.0 (H) 09/09/2016 1329   EOSABS 0.3 11/29/2021 0641   EOSABS 0.1 12/06/2018 0914   BASOSABS 0.0 11/29/2021 0641   BASOSABS 0.1 12/06/2018 0914   BASOSABS 0.0 09/09/2016 1329    ASSESSMENT AND PLAN:  There are no diagnoses linked to this encounter.   Patient was given the opportunity to ask questions.  Patient verbalized understanding of the plan and was able to repeat key elements of the plan. Patient was given clear instructions to go to Emergency Department or return to medical center if symptoms don't improve, worsen, or new problems develop.The patient verbalized understanding.   No orders of the defined types were placed in this encounter.    Requested Prescriptions    No prescriptions requested or ordered in this encounter    No follow-ups on file.  Becky Sax, MD

## 2022-03-04 ENCOUNTER — Telehealth: Payer: Self-pay | Admitting: *Deleted

## 2022-03-04 ENCOUNTER — Encounter: Payer: Self-pay | Admitting: Family Medicine

## 2022-03-04 ENCOUNTER — Telehealth: Payer: Self-pay | Admitting: Family Medicine

## 2022-03-04 NOTE — Telephone Encounter (Signed)
Patient needs to be seen 2/2 grieving issues regarding recent loss of mother.

## 2022-03-04 NOTE — Progress Notes (Signed)
Established Patient Office Visit  Subjective    Patient ID: Natalie Edwards, female    DOB: [birth year]  Age: 56 y.o. MRN: [id number]  CC:  Chief Complaint  Patient presents with   Follow-up    HPI Natalie Edwards presents with complaint of situational depression and anxiety after her mother's death in 02-24-23. She had become sick with covid in 24-Jan-2023 before her death.    Outpatient Encounter Medications as of 03/03/2022  Medication Sig   mirtazapine (REMERON) 15 MG tablet Take 1 tablet (15 mg total) by mouth at bedtime.   amLODipine-benazepril (LOTREL) 10-40 MG capsule TAKE 1 CAPSULE BY MOUTH DAILY   atorvastatin (LIPITOR) 20 MG tablet Take 1 tablet (20 mg total) by mouth daily.   benzonatate (TESSALON) 100 MG capsule Take 1 capsule (100 mg total) by mouth every 8 (eight) hours as needed for cough. (Patient not taking: Reported on 01/30/2022)   esomeprazole (NEXIUM) 40 MG capsule Take 1 capsule (40 mg total) by mouth 2 (two) times daily.   gabapentin (NEURONTIN) 100 MG capsule Take 1 capsule (100 mg total) by mouth at bedtime. For one week. Then take 2 capsules at bedtime for 1 week. Then take 3 capsules at bedtime for 1 week. Continue with 3 capsules at night moving forward. (Patient not taking: Reported on 01/30/2022)   hydrochlorothiazide (HYDRODIURIL) 25 MG tablet Take 1 tablet by mouth daily.   HYDROcodone-acetaminophen (NORCO/VICODIN) 5-325 MG tablet Take 2 tablets by mouth every 4 (four) hours as needed. (Patient not taking: Reported on 01/30/2022)   ibuprofen (ADVIL) 600 MG tablet Take 1 tablet (600 mg total) by mouth every 6 (six) hours as needed. (Patient not taking: Reported on 01/30/2022)   loratadine (CLARITIN) 10 MG tablet Take 1 tablet (10 mg total) by mouth daily.   methylPREDNISolone (MEDROL DOSEPAK) 4 MG TBPK tablet Follow package insert (Patient not taking: Reported on 01/30/2022)   mirtazapine (REMERON) 15 MG tablet Take 1 tablet by mouth at bedtime (Patient not taking:  Reported on 01/30/2022)   Suvorexant (BELSOMRA) 20 MG TABS Take 20 mg by mouth at bedtime. (Patient not taking: Reported on 01/30/2022)   temazepam (RESTORIL) 30 MG capsule Take 1 capsule (30 mg total) by mouth at bedtime as needed for sleep. (Patient not taking: Reported on 01/30/2022)   zolpidem (AMBIEN CR) 12.5 MG CR tablet Take 1 tablet (12.5 mg total) by mouth at bedtime as needed for sleep.   No facility-administered encounter medications on file as of 03/03/2022.    Past Medical History:  Diagnosis Date   Allergy    Anemia    Diverticulosis    Gall bladder stones 1993   Gastric ulcer    GERD (gastroesophageal reflux disease)    Hypertension    Renal mass     Past Surgical History:  Procedure Laterality Date   ABDOMINAL HYSTERECTOMY     CHOLECYSTECTOMY      Family History  Problem Relation Age of Onset   Cancer Maternal Uncle        Lung   Cancer Maternal Uncle        Lung   Cancer Maternal Uncle        Lung   Headache Neg Hx        "I don't think so"   Migraines Neg Hx        "I don't think so"    Social History   Socioeconomic History   Marital status: Single    Spouse name: Not on file  Number of children: 3   Years of education: Not on file   Highest education level: High school graduate  Occupational History   Not on file  Tobacco Use   Smoking status: Every Day    Packs/day: 0.25    Years: 25.00    Total pack years: 6.25    Types: Cigarettes   Smokeless tobacco: Never   Tobacco comments:    Pt tried to quit - helps with anxiety   Vaping Use   Vaping Use: Never used  Substance and Sexual Activity   Alcohol use: Yes   Drug use: No   Sexual activity: Yes    Comment: hysterectomy  Other Topics Concern   Not on file  Social History Narrative   Lives at home in an apartment. Her mother lives with her.    Right handed   Caffeine: drinks approx. 36 oz of pepsi per day. Sometimes drinks 2 cups of coffee in a day as well.    Social  Determinants of Health   Financial Resource Strain: Not on file  Food Insecurity: Not on file  Transportation Needs: Not on file  Physical Activity: Not on file  Stress: Not on file  Social Connections: Not on file  Intimate Partner Violence: Not on file    Review of Systems  Psychiatric/Behavioral:  Positive for depression. Negative for suicidal ideas. The patient is nervous/anxious and has insomnia.   All other systems reviewed and are negative.       Objective    BP 113/73   Pulse 77   Temp 98.1 F (36.7 C) (Oral)   Resp 16   Wt 164 lb 12.8 oz (74.8 kg)   SpO2 95%   BMI 29.19 kg/m   Physical Exam Vitals and nursing note reviewed.  Psychiatric:        Mood and Affect: Affect normal. Mood is anxious and depressed.        Behavior: Behavior is cooperative.         Assessment & Plan:   1. Anxiety and depression Remeron prescribed. Referral to SW for counseling.   2. Encounter for completion of form with patient FMLA completed    Return in about 4 weeks (around 03/31/2022) for follow up.   Becky Sax, MD

## 2022-03-04 NOTE — Telephone Encounter (Signed)
Patient was call to inform that there was no fax number on form that was left. Patient need to leaves fax number or pick up form .

## 2022-03-07 NOTE — Telephone Encounter (Signed)
Pt returned call stated will pick up form on Tuesday.   Please advise.

## 2022-03-11 NOTE — Telephone Encounter (Signed)
Form in folder up front

## 2022-03-11 NOTE — Telephone Encounter (Signed)
Pt will come today to retrieve the forms as well

## 2022-03-11 NOTE — Telephone Encounter (Signed)
Please fax to [facility]  Fax: [phone]

## 2022-03-19 ENCOUNTER — Telehealth: Payer: Self-pay | Admitting: Family Medicine

## 2022-03-19 NOTE — Telephone Encounter (Signed)
Left message for patient to call back and schedule Medicare Annual Wellness Visit (AWV) either virtually or phone . Left  my Herbie Drape number [phone]   awvi 11/15/20 per [facility]  please schedule with Nurse Health Adviser   45 min for awv-i and in office appointments 30 min for awv-s  phone/virtual appointments

## 2022-03-28 ENCOUNTER — Telehealth: Payer: Self-pay

## 2022-03-28 NOTE — Telephone Encounter (Signed)
Pt was mailed a letter to advise to chang pcp with insurance or to make an appointment to be seen with [facility].   Elyse Jarvis RMA

## 2022-03-31 ENCOUNTER — Other Ambulatory Visit: Payer: Self-pay | Admitting: Gastroenterology

## 2022-03-31 MED ORDER — ESOMEPRAZOLE MAGNESIUM 40 MG PO CPDR: 40.0000 mg | DELAYED_RELEASE_CAPSULE | Freq: Two times a day (BID) | ORAL | 0 refills | Status: DC

## 2022-04-03 ENCOUNTER — Ambulatory Visit (INDEPENDENT_AMBULATORY_CARE_PROVIDER_SITE_OTHER): Payer: Medicare Other | Admitting: Family Medicine

## 2022-04-03 ENCOUNTER — Encounter: Payer: Self-pay | Admitting: Family Medicine

## 2022-04-03 VITALS — BP 100/69 | HR 76 | Temp 98.0°F | Resp 16 | Wt 163.4 lb

## 2022-04-03 DIAGNOSIS — F99 Mental disorder, not otherwise specified: Secondary | ICD-10-CM

## 2022-04-03 DIAGNOSIS — F419 Anxiety disorder, unspecified: Secondary | ICD-10-CM

## 2022-04-03 DIAGNOSIS — F5105 Insomnia due to other mental disorder: Secondary | ICD-10-CM | POA: Diagnosis not present

## 2022-04-03 DIAGNOSIS — F32A Depression, unspecified: Secondary | ICD-10-CM

## 2022-04-03 MED ORDER — MIRTAZAPINE 30 MG PO TABS: 30.0000 mg | ORAL_TABLET | Freq: Every day | ORAL | 1 refills | Status: DC

## 2022-04-03 NOTE — Telephone Encounter (Signed)
Patient seen in office, did not express need for grief counseling

## 2022-04-07 ENCOUNTER — Encounter: Payer: Self-pay | Admitting: Family Medicine

## 2022-04-07 NOTE — Progress Notes (Signed)
Established Patient Office Visit  Subjective    Patient ID: Natalie Edwards, female    DOB: 1965-06-05  Age: 57 y.o. MRN: [id number]  CC:  Chief Complaint  Patient presents with   Follow-up    HPI Natalie Edwards presents for follow up of anxiety/depression. Patient reports minimal to moderate improvement.   Outpatient Encounter Medications as of 04/03/2022  Medication Sig   mirtazapine (REMERON) 30 MG tablet Take 1 tablet (30 mg total) by mouth at bedtime.   amLODipine-benazepril (LOTREL) 10-40 MG capsule TAKE 1 CAPSULE BY MOUTH DAILY   atorvastatin (LIPITOR) 20 MG tablet Take 1 tablet (20 mg total) by mouth daily.   benzonatate (TESSALON) 100 MG capsule Take 1 capsule (100 mg total) by mouth every 8 (eight) hours as needed for cough. (Patient not taking: Reported on 01/30/2022)   esomeprazole (NEXIUM) 40 MG capsule Take 1 capsule (40 mg total) by mouth 2 (two) times daily.   gabapentin (NEURONTIN) 100 MG capsule Take 1 capsule (100 mg total) by mouth at bedtime. For one week. Then take 2 capsules at bedtime for 1 week. Then take 3 capsules at bedtime for 1 week. Continue with 3 capsules at night moving forward. (Patient not taking: Reported on 01/30/2022)   hydrochlorothiazide (HYDRODIURIL) 25 MG tablet Take 1 tablet by mouth daily.   HYDROcodone-acetaminophen (NORCO/VICODIN) 5-325 MG tablet Take 2 tablets by mouth every 4 (four) hours as needed. (Patient not taking: Reported on 01/30/2022)   ibuprofen (ADVIL) 600 MG tablet Take 1 tablet (600 mg total) by mouth every 6 (six) hours as needed. (Patient not taking: Reported on 01/30/2022)   loratadine (CLARITIN) 10 MG tablet Take 1 tablet (10 mg total) by mouth daily.   methylPREDNISolone (MEDROL DOSEPAK) 4 MG TBPK tablet Follow package insert (Patient not taking: Reported on 01/30/2022)   mirtazapine (REMERON) 15 MG tablet Take 1 tablet by mouth at bedtime (Patient not taking: Reported on 01/30/2022)   mirtazapine (REMERON) 15 MG tablet Take  1 tablet (15 mg total) by mouth at bedtime.   Suvorexant (BELSOMRA) 20 MG TABS Take 20 mg by mouth at bedtime. (Patient not taking: Reported on 01/30/2022)   temazepam (RESTORIL) 30 MG capsule Take 1 capsule (30 mg total) by mouth at bedtime as needed for sleep. (Patient not taking: Reported on 01/30/2022)   zolpidem (AMBIEN CR) 12.5 MG CR tablet Take 1 tablet (12.5 mg total) by mouth at bedtime as needed for sleep.   No facility-administered encounter medications on file as of 04/03/2022.    Past Medical History:  Diagnosis Date   Allergy    Anemia    Diverticulosis    Gall bladder stones 1993   Gastric ulcer    GERD (gastroesophageal reflux disease)    Hypertension    Renal mass     Past Surgical History:  Procedure Laterality Date   ABDOMINAL HYSTERECTOMY     CHOLECYSTECTOMY      Family History  Problem Relation Age of Onset   Cancer Maternal Uncle        Lung   Cancer Maternal Uncle        Lung   Cancer Maternal Uncle        Lung   Headache Neg Hx        "I don't think so"   Migraines Neg Hx        "I don't think so"    Social History   Socioeconomic History   Marital status: Single    Spouse name:  Not on file   Number of children: 3   Years of education: Not on file   Highest education level: High school graduate  Occupational History   Not on file  Tobacco Use   Smoking status: Every Day    Packs/day: 0.25    Years: 25.00    Total pack years: 6.25    Types: Cigarettes   Smokeless tobacco: Never   Tobacco comments:    Pt tried to quit - helps with anxiety   Vaping Use   Vaping Use: Never used  Substance and Sexual Activity   Alcohol use: Yes   Drug use: No   Sexual activity: Yes    Comment: hysterectomy  Other Topics Concern   Not on file  Social History Narrative   Lives at home in an apartment. Her mother lives with her.    Right handed   Caffeine: drinks approx. 36 oz of pepsi per day. Sometimes drinks 2 cups of coffee in a day as well.     Social Determinants of Health   Financial Resource Strain: Not on file  Food Insecurity: Not on file  Transportation Needs: Not on file  Physical Activity: Not on file  Stress: Not on file  Social Connections: Not on file  Intimate Partner Violence: Not on file    Review of Systems  Psychiatric/Behavioral:  Positive for depression. Negative for suicidal ideas. The patient is nervous/anxious and has insomnia.   All other systems reviewed and are negative.       Objective    BP 100/69   Pulse 76   Temp 98 F (36.7 C) (Oral)   Resp 16   Wt 163 lb 6.4 oz (74.1 kg)   SpO2 95%   BMI 28.95 kg/m   Physical Exam Vitals and nursing note reviewed.  Psychiatric:        Mood and Affect: Affect normal. Mood is anxious and depressed.        Behavior: Behavior is cooperative.         Assessment & Plan:   1. Anxiety and depression Will increase remeron from '15mg'$  to 30 mg daily and monitor  2. Insomnia due to other mental disorder As abopve    No follow-ups on file.   Becky Sax, MD

## 2022-04-11 ENCOUNTER — Other Ambulatory Visit: Payer: Self-pay | Admitting: Family Medicine

## 2022-04-11 DIAGNOSIS — F32A Depression, unspecified: Secondary | ICD-10-CM

## 2022-04-14 ENCOUNTER — Telehealth: Payer: Self-pay | Admitting: *Deleted

## 2022-04-14 NOTE — Progress Notes (Signed)
  Care Coordination  Outreach Note  04/14/2022 Name: Natalie Edwards MRN: [id number] DOB: 1965/05/27   Care Coordination Outreach Attempts: An unsuccessful telephone outreach was attempted today to offer the patient information about available care coordination services as a benefit of their health plan.   Follow Up Plan:  Additional outreach attempts will be made to offer the patient care coordination information and services.   Encounter Outcome:  No Answer  [place]  Direct Dial: [phone]

## 2022-04-16 ENCOUNTER — Other Ambulatory Visit: Payer: Self-pay | Admitting: Family

## 2022-04-16 DIAGNOSIS — I10 Essential (primary) hypertension: Secondary | ICD-10-CM

## 2022-04-17 NOTE — Telephone Encounter (Signed)
Requested medication (s) are due for refill today: yes to both  Requested medication (s) are on the active medication list: present but expired  Last refill:  both last RF 12/13/21  Future visit scheduled: yes  Notes to clinic:  both prescriptions expired 03/13/22   Requested Prescriptions  Pending Prescriptions Disp Refills   amLODipine-benazepril (LOTREL) 10-40 MG capsule [Pharmacy Med Name: amlodipine 10 mg-benazepril 40 mg capsule] 30 capsule 2    Sig: TAKE 1 CAPSULE BY MOUTH DAILY     Cardiovascular: CCB + ACEI Combos Passed - 04/16/2022  5:02 PM      Passed - Cr in normal range and within 180 days    Creatinine  Date Value Ref Range Status  03/08/2019 1.37 (H) 0.44 - 1.00 mg/dL Final   Creatinine, Ser  Date Value Ref Range Status  11/29/2021 0.96 0.44 - 1.00 mg/dL Final         Passed - K in normal range and within 180 days    Potassium  Date Value Ref Range Status  11/29/2021 4.1 3.5 - 5.1 mmol/L Final         Passed - Na in normal range and within 180 days    Sodium  Date Value Ref Range Status  11/29/2021 141 135 - 145 mmol/L Final  01/19/2019 141 134 - 144 mmol/L Final         Passed - eGFR is 30 or above and within 180 days    GFR, Est AFR Am  Date Value Ref Range Status  03/08/2019 51 (L) >60 mL/min Final   GFR, Estimated  Date Value Ref Range Status  11/29/2021 >60 >60 mL/min Final    Comment:    (NOTE) Calculated using the CKD-EPI Creatinine Equation (2021)   03/08/2019 44 (L) >60 mL/min Final   GFR  Date Value Ref Range Status  05/03/2019 53.93 (L) >60.00 mL/min Final         Passed - Patient is not pregnant      Passed - Last BP in normal range    BP Readings from Last 1 Encounters:  04/03/22 100/69         Passed - Valid encounter within last 6 months    Recent Outpatient Visits           2 weeks ago Anxiety and depression   [facility], MD   1 month ago Anxiety and depression   [facility], MD   4 months ago Primary hypertension   [place] [facility] [place], NP   11 months ago Anxiety and depression   [place] [facility], Kriste Basque, NP   2 years ago GAD (generalized anxiety disorder)   [place] [facility], MD       Future Appointments             In 2 weeks Dorna Mai, MD [facility]             hydrochlorothiazide (HYDRODIURIL) 25 MG tablet [Pharmacy Med Name: hydrochlorothiazide 25 mg tablet] 30 tablet 2    Sig: Take 1 tablet by mouth daily.     Cardiovascular: Diuretics - Thiazide Passed - 04/16/2022  5:02 PM      Passed - Cr in normal range and within 180 days    Creatinine  Date Value Ref Range Status  03/08/2019 1.37 (H) 0.44 - 1.00 mg/dL Final   Creatinine, Ser  Date Value Ref Range Status  11/29/2021 0.96 0.44 - 1.00 mg/dL Final         Passed - K in normal range and within 180 days    Potassium  Date Value Ref Range Status  11/29/2021 4.1 3.5 - 5.1 mmol/L Final         Passed - Na in normal range and within 180 days    Sodium  Date Value Ref Range Status  11/29/2021 141 135 - 145 mmol/L Final  01/19/2019 141 134 - 144 mmol/L Final         Passed - Last BP in normal range    BP Readings from Last 1 Encounters:  04/03/22 100/69         Passed - Valid encounter within last 6 months    Recent Outpatient Visits           2 weeks ago Anxiety and depression   [place] [facility], MD   1 month ago Anxiety and depression   [place] [facility], MD   4 months ago Primary hypertension   [place] [facility] [place], NP   11 months ago Anxiety and depression   [place] [facility], Kriste Basque, NP   2 years ago GAD  (generalized anxiety disorder)   [place] [facility], Bayard Beaver, MD       Future Appointments             In 2 weeks Dorna Mai, MD [facility]

## 2022-04-21 NOTE — Progress Notes (Signed)
  Care Coordination  Outreach Note  04/21/2022 Name: Zayli Villafuerte MRN: [id number] DOB: 1965-07-24   Care Coordination Outreach Attempts: A third unsuccessful outreach was attempted today to offer the patient with information about available care coordination services as a benefit of their health plan.   Follow Up Plan:  No further outreach attempts will be made at this time. We have been unable to contact the patient to offer or enroll patient in care coordination services  Encounter Outcome:  No Answer  [place]: [phone]

## 2022-04-29 ENCOUNTER — Ambulatory Visit
Admission: EM | Admit: 2022-04-29 | Discharge: 2022-04-29 | Disposition: A | Discharge status: Home / Self Care | Payer: Medicare Other | Attending: Family Medicine | Admitting: Family Medicine

## 2022-04-29 DIAGNOSIS — R051 Acute cough: Secondary | ICD-10-CM | POA: Diagnosis not present

## 2022-04-29 DIAGNOSIS — R6883 Chills (without fever): Secondary | ICD-10-CM | POA: Insufficient documentation

## 2022-04-29 DIAGNOSIS — R52 Pain, unspecified: Secondary | ICD-10-CM

## 2022-04-29 DIAGNOSIS — Z1152 Encounter for screening for COVID-19: Secondary | ICD-10-CM | POA: Diagnosis not present

## 2022-04-29 LAB — POCT INFLUENZA A/B
Influenza A, POC: NEGATIVE
Influenza B, POC: NEGATIVE

## 2022-04-29 MED ORDER — BENZONATATE 200 MG PO CAPS: 200.0000 mg | ORAL_CAPSULE | Freq: Three times a day (TID) | ORAL | 0 refills | Status: DC

## 2022-04-29 NOTE — Discharge Instructions (Signed)
You were seen today for upper respiratory symptoms  Your flu swab was negative.  Your covid swab will be resulted tomorrow and if positive we can start treatment for covid.  In the mean time I recommend you get plenty of rest and fluids.  Please use tylenol for body aches, pain or fever. I have sent out tessalon for the cough.  If covid positive then please self-isolate for 5 days from symptom onset.

## 2022-04-29 NOTE — ED Provider Notes (Signed)
[facility]    CSN: [id number] Arrival date & time: 04/29/22  1402      History   Chief Complaint Chief Complaint  Patient presents with   Headache   Chills   Nausea   Generalized Body Aches    HPI Natalie Edwards is a 57 y.o. female.   Patient is here for not feeling well.  Symptoms just started about 10 am today.  She started with chills, headache, cough, body aches.  Some n/v, mild diarrhea.  No known fevers, but very chilled.  No meds taken.  Her son had a cold, but no covid/flu.        Past Medical History:  Diagnosis Date   Allergy    Anemia    Diverticulosis    Gall bladder stones 1993   Gastric ulcer    GERD (gastroesophageal reflux disease)    Hypertension    Renal mass     Patient Active Problem List   Diagnosis Date Noted   Hyperlipidemia 12/14/2021   Prediabetes 12/14/2021   Anxiety and depression 05/14/2021   Breathing difficult 05/14/2021   Upper respiratory tract infection 05/14/2021   GERD (gastroesophageal reflux disease)    Visit for routine gyn exam 01/14/2021   Screening mammogram, encounter for 01/14/2021   Possible exposure to STD 01/14/2021   Chronic insomnia 10/18/2019   CKD (chronic kidney disease) 04/07/2019   GAD (generalized anxiety disorder) 05/07/2018   Chronic gastric ulcer 05/07/2018   Essential hypertension 05/07/2018   Iron deficiency anemia due to chronic blood loss 09/23/2016   Peptic ulcer disease with hemorrhage 09/23/2016   Thrombocytosis 09/09/2016   Leukocytosis 09/09/2016   H/O ulcer disease 07/31/2016    Past Surgical History:  Procedure Laterality Date   ABDOMINAL HYSTERECTOMY     CHOLECYSTECTOMY      OB History     Gravida  7   Para  3   Term  3   Preterm      AB  4   Living         SAB      IAB  4   Ectopic      Multiple      Live Births               Home Medications    Prior to Admission medications   Medication Sig Start Date End Date Taking?  Authorizing Provider  amLODipine-benazepril (LOTREL) 10-40 MG capsule TAKE 1 CAPSULE BY MOUTH DAILY 04/17/22 07/16/22  Dorna Mai, MD  atorvastatin (LIPITOR) 20 MG tablet Take 1 tablet (20 mg total) by mouth daily. 12/14/21 03/14/22  Camillia Herter, NP  benzonatate (TESSALON) 100 MG capsule Take 1 capsule (100 mg total) by mouth every 8 (eight) hours as needed for cough. Patient not taking: Reported on 01/30/2022 05/29/21   Teodora Medici, FNP  esomeprazole (NEXIUM) 40 MG capsule Take 1 capsule (40 mg total) by mouth 2 (two) times daily. 03/31/22   Thornton Park, MD  gabapentin (NEURONTIN) 100 MG capsule Take 1 capsule (100 mg total) by mouth at bedtime. For one week. Then take 2 capsules at bedtime for 1 week. Then take 3 capsules at bedtime for 1 week. Continue with 3 capsules at night moving forward. Patient not taking: Reported on 01/30/2022 01/10/22 01/10/23  Merlene Pulling K, PA-C  hydrochlorothiazide (HYDRODIURIL) 25 MG tablet TAKE 1 TABLET BY MOUTH DAILY 04/17/22 07/16/22  Dorna Mai, MD  HYDROcodone-acetaminophen (NORCO/VICODIN) 5-325 MG tablet Take 2 tablets by mouth  every 4 (four) hours as needed. Patient not taking: Reported on 01/30/2022 11/28/21   Malvin Johns, MD  ibuprofen (ADVIL) 600 MG tablet Take 1 tablet (600 mg total) by mouth every 6 (six) hours as needed. Patient not taking: Reported on 01/30/2022 11/27/21   Nyoka Lint, PA-C  loratadine (CLARITIN) 10 MG tablet Take 1 tablet (10 mg total) by mouth daily. 08/18/19   Nicolette Bang, MD  methylPREDNISolone (MEDROL DOSEPAK) 4 MG TBPK tablet Follow package insert Patient not taking: Reported on 01/30/2022 11/29/21   Lennice Sites, DO  mirtazapine (REMERON) 15 MG tablet Take 1 tablet by mouth at bedtime Patient not taking: Reported on 01/30/2022 02/14/21     mirtazapine (REMERON) 15 MG tablet Take 1 tablet (15 mg total) by mouth at bedtime. 03/03/22   Dorna Mai, MD  mirtazapine (REMERON) 30 MG tablet Take 1 tablet  (30 mg total) by mouth at bedtime. 04/03/22   Dorna Mai, MD  Suvorexant (BELSOMRA) 20 MG TABS Take 20 mg by mouth at bedtime. Patient not taking: Reported on 01/30/2022 01/02/22   Sherrilyn Rist A, MD  temazepam (RESTORIL) 30 MG capsule Take 1 capsule (30 mg total) by mouth at bedtime as needed for sleep. Patient not taking: Reported on 01/30/2022 01/02/22   Laurin Coder, MD  zolpidem (AMBIEN CR) 12.5 MG CR tablet Take 1 tablet (12.5 mg total) by mouth at bedtime as needed for sleep. 01/30/22   Laurin Coder, MD    Family History Family History  Problem Relation Age of Onset   Cancer Maternal Uncle        Lung   Cancer Maternal Uncle        Lung   Cancer Maternal Uncle        Lung   Headache Neg Hx        "I don't think so"   Migraines Neg Hx        "I don't think so"    Social History Social History   Tobacco Use   Smoking status: Every Day    Packs/day: 0.25    Years: 25.00    Total pack years: 6.25    Types: Cigarettes   Smokeless tobacco: Never   Tobacco comments:    Pt tried to quit - helps with anxiety   Vaping Use   Vaping Use: Never used  Substance Use Topics   Alcohol use: Yes   Drug use: No     Allergies   Hydroxyzine and Ambien [zolpidem tartrate]   Review of Systems Review of Systems  Constitutional:  Positive for chills, fatigue and fever.  HENT:  Positive for congestion and rhinorrhea.   Respiratory:  Positive for cough. Negative for shortness of breath and wheezing.   Cardiovascular: Negative.   Gastrointestinal:  Positive for diarrhea, nausea and vomiting.  Musculoskeletal:  Positive for arthralgias and myalgias.  Skin: Negative.   Neurological:  Positive for headaches.  Psychiatric/Behavioral: Negative.       Physical Exam Triage Vital Signs ED Triage Vitals  Enc Vitals Group     BP 04/29/22 1501 (!) 148/87     Pulse Rate 04/29/22 1501 (!) 122     Resp 04/29/22 1501 20     Temp 04/29/22 1501 99.3 F (37.4 C)      Temp Source 04/29/22 1501 Oral     SpO2 04/29/22 1501 93 %     Weight --      Height --      Head Circumference --  Peak Flow --      Pain Score 04/29/22 1502 6     Pain Loc --      Pain Edu? --      Excl. in [place]? --    No data found.  Updated Vital Signs BP (!) 148/87 (BP Location: Left Arm)   Pulse (!) 122   Temp 99.3 F (37.4 C) (Oral)   Resp 20   SpO2 93%   Visual Acuity Right Eye Distance:   Left Eye Distance:   Bilateral Distance:    Right Eye Near:   Left Eye Near:    Bilateral Near:     Physical Exam Constitutional:      General: She is not in acute distress.    Appearance: She is well-developed. She is ill-appearing.  HENT:     Head: Normocephalic.     Nose: Congestion present.  Cardiovascular:     Rate and Rhythm: Regular rhythm. Tachycardia present.     Heart sounds: Normal heart sounds.  Pulmonary:     Effort: Pulmonary effort is normal.     Breath sounds: Normal breath sounds. No wheezing or rhonchi.  Musculoskeletal:     Cervical back: Normal range of motion and neck supple. Tenderness present.  Lymphadenopathy:     Cervical: No cervical adenopathy.  Skin:    General: Skin is warm.  Neurological:     General: No focal deficit present.     Mental Status: She is alert.  Psychiatric:        Mood and Affect: Mood normal.      UC Treatments / Results  Labs (all labs ordered are listed, but only abnormal results are displayed) Labs Reviewed  SARS CORONAVIRUS 2 (TAT 6-24 HRS)  POCT INFLUENZA A/B    EKG   Radiology No results found.  Procedures Procedures (including critical care time)  Medications Ordered in UC Medications - No data to display  Initial Impression / Assessment and Plan / UC Course  I have reviewed the triage vital signs and the nursing notes.  Pertinent labs & imaging results that were available during my care of the patient were reviewed by me and considered in my medical decision making (see chart for  details).    Final Clinical Impressions(s) / UC Diagnoses   Final diagnoses:  Body aches  Acute cough  Chills  Encounter for screening for COVID-19     Discharge Instructions      You were seen today for upper respiratory symptoms  Your flu swab was negative.  Your covid swab will be resulted tomorrow and if positive we can start treatment for covid.  In the mean time I recommend you get plenty of rest and fluids.  Please use tylenol for body aches, pain or fever. I have sent out tessalon for the cough.  If covid positive then please self-isolate for 5 days from symptom onset.     ED Prescriptions     Medication Sig Dispense Auth. Provider   benzonatate (TESSALON) 200 MG capsule Take 1 capsule (200 mg total) by mouth 3 (three) times daily for 7 days. 21 capsule Rondel Oh, MD      PDMP not reviewed this encounter.   Rondel Oh, MD 04/29/22 1535

## 2022-04-29 NOTE — ED Triage Notes (Signed)
Pt presents with chills, generalized body aches, headache and nausea since waking up this morning.

## 2022-04-30 ENCOUNTER — Telehealth (HOSPITAL_COMMUNITY): Payer: Self-pay | Admitting: Emergency Medicine

## 2022-04-30 LAB — SARS CORONAVIRUS 2 (TAT 6-24 HRS): SARS Coronavirus 2: POSITIVE — AB

## 2022-04-30 MED ORDER — NIRMATRELVIR/RITONAVIR (PAXLOVID)TABLET: 3.0000 | ORAL_TABLET | Freq: Two times a day (BID) | ORAL | 0 refills | Status: DC

## 2022-05-01 ENCOUNTER — Ambulatory Visit: Payer: Medicare Other | Admitting: Family Medicine

## 2022-05-01 ENCOUNTER — Telehealth: Payer: Self-pay | Admitting: Internal Medicine

## 2022-05-01 ENCOUNTER — Telehealth: Payer: Self-pay

## 2022-05-01 MED ORDER — BENZONATATE 200 MG PO CAPS: 200.0000 mg | ORAL_CAPSULE | Freq: Three times a day (TID) | ORAL | 0 refills | Status: DC | PRN

## 2022-05-01 MED ORDER — NIRMATRELVIR/RITONAVIR (PAXLOVID)TABLET: 3.0000 | ORAL_TABLET | Freq: Two times a day (BID) | ORAL | 0 refills | Status: AC

## 2022-05-01 NOTE — Telephone Encounter (Signed)
Patient came to urgent care today wanting benzonatate cough medication sent to a different pharmacy.  Represcribed medication to a different pharmacy at previously prescribed dose per urgent care note.

## 2022-05-12 ENCOUNTER — Ambulatory Visit (INDEPENDENT_AMBULATORY_CARE_PROVIDER_SITE_OTHER): Payer: Medicare Other

## 2022-05-12 ENCOUNTER — Ambulatory Visit
Admission: EM | Admit: 2022-05-12 | Discharge: 2022-05-12 | Disposition: A | Discharge status: Home / Self Care | Payer: Medicare Other | Attending: Nurse Practitioner | Admitting: Nurse Practitioner

## 2022-05-12 DIAGNOSIS — R059 Cough, unspecified: Secondary | ICD-10-CM | POA: Diagnosis not present

## 2022-05-12 DIAGNOSIS — R053 Chronic cough: Secondary | ICD-10-CM | POA: Diagnosis not present

## 2022-05-12 DIAGNOSIS — U071 COVID-19: Secondary | ICD-10-CM

## 2022-05-12 DIAGNOSIS — R450 Nervousness: Secondary | ICD-10-CM

## 2022-05-12 MED ORDER — PROMETHAZINE-DM 6.25-15 MG/5ML PO SYRP: 5.0000 mL | ORAL_SOLUTION | Freq: Four times a day (QID) | ORAL | 0 refills | Status: DC | PRN

## 2022-05-12 MED ORDER — AMOXICILLIN-POT CLAVULANATE 875-125 MG PO TABS: 1.0000 | ORAL_TABLET | Freq: Two times a day (BID) | ORAL | 0 refills | Status: AC

## 2022-05-12 NOTE — ED Triage Notes (Signed)
Pt c/o having "the jitters," fatigue, hard to stand for long, "mucous in chest,"   Reports that she was recently dx w/ covid at this UC.   Onset ~1-2 weeks ago   Not best medication historian

## 2022-05-12 NOTE — ED Provider Notes (Signed)
[facility]    CSN: [id number] Arrival date & time: 05/12/22  0803      History   Chief Complaint Chief Complaint  Patient presents with   "Jittery"    HPI Natalie Edwards is a 57 y.o. female.   HPI  She is in today complaining of persistent feeling jittery, dizziness, cough and congestion. She was dx with COVID 2 weeks ago; was prescribed antiviral but did not complete treatment; reports she could not tolerated. She continues to have productive cough. She denies shortness of breath or chest pain. She started Mucinex on yesterday.  Past Medical History:  Diagnosis Date   Allergy    Anemia    Diverticulosis    Gall bladder stones 1993   Gastric ulcer    GERD (gastroesophageal reflux disease)    Hypertension    Renal mass     Patient Active Problem List   Diagnosis Date Noted   Hyperlipidemia 12/14/2021   Prediabetes 12/14/2021   Anxiety and depression 05/14/2021   Breathing difficult 05/14/2021   Upper respiratory tract infection 05/14/2021   GERD (gastroesophageal reflux disease)    Visit for routine gyn exam 01/14/2021   Screening mammogram, encounter for 01/14/2021   Possible exposure to STD 01/14/2021   Chronic insomnia 10/18/2019   CKD (chronic kidney disease) 04/07/2019   GAD (generalized anxiety disorder) 05/07/2018   Chronic gastric ulcer 05/07/2018   Essential hypertension 05/07/2018   Iron deficiency anemia due to chronic blood loss 09/23/2016   Peptic ulcer disease with hemorrhage 09/23/2016   Thrombocytosis 09/09/2016   Leukocytosis 09/09/2016   H/O ulcer disease 07/31/2016    Past Surgical History:  Procedure Laterality Date   ABDOMINAL HYSTERECTOMY     CHOLECYSTECTOMY      OB History     Gravida  7   Para  3   Term  3   Preterm      AB  4   Living         SAB      IAB  4   Ectopic      Multiple      Live Births               Home Medications    Prior to Admission medications   Medication Sig Start  Date End Date Taking? Authorizing Provider  amLODipine-benazepril (LOTREL) 10-40 MG capsule TAKE 1 CAPSULE BY MOUTH DAILY 04/17/22 07/16/22  Dorna Mai, MD  amoxicillin-clavulanate (AUGMENTIN) 875-125 MG tablet Take 1 tablet by mouth every 12 (twelve) hours for 10 days. 05/12/22 05/22/22 Yes Vevelyn Francois, NP  esomeprazole (NEXIUM) 40 MG capsule Take 1 capsule (40 mg total) by mouth 2 (two) times daily. 03/31/22   Thornton Park, MD  hydrochlorothiazide (HYDRODIURIL) 25 MG tablet TAKE 1 TABLET BY MOUTH DAILY 04/17/22 07/16/22  Dorna Mai, MD  loratadine (CLARITIN) 10 MG tablet Take 1 tablet (10 mg total) by mouth daily. 08/18/19   Nicolette Bang, MD  mirtazapine (REMERON) 30 MG tablet Take 1 tablet (30 mg total) by mouth at bedtime. 04/03/22   Dorna Mai, MD  promethazine-dextromethorphan (PROMETHAZINE-DM) 6.25-15 MG/5ML syrup Take 5 mLs by mouth 4 (four) times daily as needed for cough. 05/12/22  Yes Vevelyn Francois, NP  zolpidem (AMBIEN CR) 12.5 MG CR tablet Take 1 tablet (12.5 mg total) by mouth at bedtime as needed for sleep. 01/30/22   Laurin Coder, MD    Family History Family History  Problem Relation Age of Onset  Cancer Maternal Uncle        Lung   Cancer Maternal Uncle        Lung   Cancer Maternal Uncle        Lung   Headache Neg Hx        "I don't think so"   Migraines Neg Hx        "I don't think so"    Social History Social History   Tobacco Use   Smoking status: Every Day    Packs/day: 0.25    Years: 25.00    Total pack years: 6.25    Types: Cigarettes   Smokeless tobacco: Never   Tobacco comments:    Pt tried to quit - helps with anxiety   Vaping Use   Vaping Use: Never used  Substance Use Topics   Alcohol use: Yes   Drug use: No     Allergies   Hydroxyzine and Ambien [zolpidem tartrate]   Review of Systems Review of Systems   Physical Exam Triage Vital Signs ED Triage Vitals [05/12/22 0832]  Enc Vitals Group     BP 114/76      Pulse Rate 82     Resp 18     Temp 98.2 F (36.8 C)     Temp Source Oral     SpO2 97 %     Weight      Height      Head Circumference      Peak Flow      Pain Score 0     Pain Loc      Pain Edu?      Excl. in [place]?    No data found.  Updated Vital Signs BP 114/76 (BP Location: Right Arm)   Pulse 82   Temp 98.2 F (36.8 C) (Oral)   Resp 18   SpO2 97%   Visual Acuity Right Eye Distance:   Left Eye Distance:   Bilateral Distance:    Right Eye Near:   Left Eye Near:    Bilateral Near:     Physical Exam Constitutional:      General: She is not in acute distress.    Appearance: She is not ill-appearing, toxic-appearing or diaphoretic.  HENT:     Head: Normocephalic and atraumatic.     Right Ear: Tympanic membrane normal.     Left Ear: Tympanic membrane normal.     Nose: Nose normal.     Mouth/Throat:     Mouth: Mucous membranes are dry.  Eyes:     Pupils: Pupils are equal, round, and reactive to light.  Cardiovascular:     Rate and Rhythm: Normal rate and regular rhythm.     Pulses: Normal pulses.     Heart sounds: Normal heart sounds.  Pulmonary:     Effort: Pulmonary effort is normal.     Breath sounds: Normal breath sounds.  Musculoskeletal:        General: Normal range of motion.     Cervical back: Normal range of motion.  Skin:    General: Skin is warm and dry.     Capillary Refill: Capillary refill takes less than 2 seconds.  Neurological:     General: No focal deficit present.     Mental Status: She is alert and oriented to person, place, and time.  Psychiatric:        Mood and Affect: Mood normal.      UC Treatments / Results  Labs (all labs ordered  are listed, but only abnormal results are displayed) Labs Reviewed  CBC WITH DIFFERENTIAL/PLATELET  BASIC METABOLIC PANEL    EKG   Radiology DG Chest 2 View  Result Date: 05/12/2022 CLINICAL DATA:  Persistent cough and chest congestion over the last 2 weeks. Recent coronavirus infection.  EXAM: CHEST - 2 VIEW COMPARISON:  None Available. FINDINGS: Heart and mediastinal shadows are normal. The right lung is clear. There is mild hazy opacity in the left mid lung consistent with mild pneumonia. This could be residual/resolving from previous viral pneumonia. No dense consolidation or collapse. No effusion. No abnormal bone finding. IMPRESSION: Mild hazy opacity in the left mid lung consistent with mild pneumonia. This could be residual/resolving from previous viral pneumonia. Electronically Signed   By: Nelson Chimes M.D.   On: 05/12/2022 09:34    Procedures Procedures (including critical care time)  Medications Ordered in UC Medications - No data to display  Initial Impression / Assessment and Plan / UC Course  I have reviewed the triage vital signs and the nursing notes.  Pertinent labs & imaging results that were available during my care of the patient were reviewed by me and considered in my medical decision making (see chart for details).     Jittery  Final Clinical Impressions(s) / UC Diagnoses   Final diagnoses:  COVID  Persistent cough  Jittery  Pneumonia due to COVID-19 virus     Discharge Instructions      Your chest x-ray demonstrates mild pneumonia. You have been prescribed Augmentin 875 mg twice daily for 10 days.  Continue Mucinex along with the Augmentin as directed. Follow up with you PCP if your symptoms persist or worsen. Your lab results will be placed in your MyChart. You PCP will have access to these results.  We encourage conservative treatment with symptom relief. We encourage you to use Tylenol alternating with Ibuprofen for your fever if not contraindicated. (Remember to use as directed do not exceed daily dosing recommendations) We also encourage salt water gargles for your sore throat. You should also consider throat lozenges and chloraseptic spray.  Your cough can be soothed with a cough suppressant. We have prescribed you a cough suppressant  to be taken as  directed.       ED Prescriptions     Medication Sig Dispense Auth. Provider   promethazine-dextromethorphan (PROMETHAZINE-DM) 6.25-15 MG/5ML syrup Take 5 mLs by mouth 4 (four) times daily as needed for cough. 240 mL Vevelyn Francois, NP   amoxicillin-clavulanate (AUGMENTIN) 875-125 MG tablet Take 1 tablet by mouth every 12 (twelve) hours for 10 days. 20 tablet Vevelyn Francois, NP      PDMP not reviewed this encounter.   Dionisio David [place], [place] 05/12/22 [phone]

## 2022-05-12 NOTE — Discharge Instructions (Addendum)
Your chest x-ray demonstrates mild pneumonia. You have been prescribed Augmentin 875 mg twice daily for 10 days.  Continue Mucinex along with the Augmentin as directed. Follow up with you PCP if your symptoms persist or worsen. Your lab results will be placed in your MyChart. You PCP will have access to these results.  We encourage conservative treatment with symptom relief. We encourage you to use Tylenol alternating with Ibuprofen for your fever if not contraindicated. (Remember to use as directed do not exceed daily dosing recommendations) We also encourage salt water gargles for your sore throat. You should also consider throat lozenges and chloraseptic spray.  Your cough can be soothed with a cough suppressant. We have prescribed you a cough suppressant to be taken as  directed.

## 2022-05-13 LAB — CBC WITH DIFFERENTIAL/PLATELET
Basophils Absolute: 0.1 10*3/uL (ref 0.0–0.2)
Basos: 0 %
EOS (ABSOLUTE): 0.2 10*3/uL (ref 0.0–0.4)
Eos: 2 %
Hematocrit: 39 % (ref 34.0–46.6)
Hemoglobin: 12.1 g/dL (ref 11.1–15.9)
Immature Grans (Abs): 0.1 10*3/uL (ref 0.0–0.1)
Immature Granulocytes: 1 %
Lymphocytes Absolute: 5.4 10*3/uL — ABNORMAL HIGH (ref 0.7–3.1)
Lymphs: 42 %
MCH: 27.4 pg (ref 26.6–33.0)
MCHC: 31 g/dL — ABNORMAL LOW (ref 31.5–35.7)
MCV: 88 fL (ref 79–97)
Monocytes Absolute: 1.1 10*3/uL — ABNORMAL HIGH (ref 0.1–0.9)
Monocytes: 9 %
Neutrophils Absolute: 6 10*3/uL (ref 1.4–7.0)
Neutrophils: 46 %
Platelets: 850 10*3/uL (ref 150–450)
RBC: 4.41 x10E6/uL (ref 3.77–5.28)
RDW: 15.7 % — ABNORMAL HIGH (ref 11.7–15.4)
WBC: 12.8 10*3/uL — ABNORMAL HIGH (ref 3.4–10.8)

## 2022-05-13 LAB — BASIC METABOLIC PANEL
BUN/Creatinine Ratio: 14 (ref 9–23)
BUN: 17 mg/dL (ref 6–24)
CO2: 21 mmol/L (ref 20–29)
Calcium: 9.9 mg/dL (ref 8.7–10.2)
Chloride: 104 mmol/L (ref 96–106)
Creatinine, Ser: 1.18 mg/dL — ABNORMAL HIGH (ref 0.57–1.00)
Glucose: 78 mg/dL (ref 70–99)
Potassium: 5.3 mmol/L — ABNORMAL HIGH (ref 3.5–5.2)
Sodium: 140 mmol/L (ref 134–144)
eGFR: 54 mL/min/{1.73_m2} — ABNORMAL LOW (ref >59)

## 2022-05-14 ENCOUNTER — Ambulatory Visit: Payer: Medicare Other | Admitting: Nurse Practitioner

## 2022-05-17 ENCOUNTER — Encounter: Payer: Self-pay | Admitting: Internal Medicine

## 2022-05-22 ENCOUNTER — Ambulatory Visit (INDEPENDENT_AMBULATORY_CARE_PROVIDER_SITE_OTHER): Payer: 59 | Admitting: Family Medicine

## 2022-05-22 ENCOUNTER — Telehealth: Payer: Self-pay

## 2022-05-22 ENCOUNTER — Encounter: Payer: Self-pay | Admitting: Family Medicine

## 2022-05-22 VITALS — BP 117/79 | HR 94 | Temp 98.1°F | Resp 16 | Ht 63.0 in | Wt 167.4 lb

## 2022-05-22 DIAGNOSIS — Z1211 Encounter for screening for malignant neoplasm of colon: Secondary | ICD-10-CM

## 2022-05-22 DIAGNOSIS — F5105 Insomnia due to other mental disorder: Secondary | ICD-10-CM

## 2022-05-22 DIAGNOSIS — Z0001 Encounter for general adult medical examination with abnormal findings: Secondary | ICD-10-CM

## 2022-05-22 DIAGNOSIS — F32A Depression, unspecified: Secondary | ICD-10-CM | POA: Diagnosis not present

## 2022-05-22 DIAGNOSIS — I10 Essential (primary) hypertension: Secondary | ICD-10-CM | POA: Diagnosis not present

## 2022-05-22 DIAGNOSIS — F419 Anxiety disorder, unspecified: Secondary | ICD-10-CM

## 2022-05-22 DIAGNOSIS — F17210 Nicotine dependence, cigarettes, uncomplicated: Secondary | ICD-10-CM | POA: Diagnosis not present

## 2022-05-22 DIAGNOSIS — Z0000 Encounter for general adult medical examination without abnormal findings: Secondary | ICD-10-CM

## 2022-05-22 DIAGNOSIS — Z1231 Encounter for screening mammogram for malignant neoplasm of breast: Secondary | ICD-10-CM

## 2022-05-22 DIAGNOSIS — F99 Mental disorder, not otherwise specified: Secondary | ICD-10-CM

## 2022-05-22 MED ORDER — HYDROCHLOROTHIAZIDE 25 MG PO TABS: 25.0000 mg | ORAL_TABLET | Freq: Every day | ORAL | 2 refills | Status: DC

## 2022-05-22 MED ORDER — MIRTAZAPINE 45 MG PO TABS: 45.0000 mg | ORAL_TABLET | Freq: Every day | ORAL | 0 refills | Status: DC

## 2022-05-22 MED ORDER — AMLODIPINE BESY-BENAZEPRIL HCL 10-40 MG PO CAPS: 1.0000 | ORAL_CAPSULE | Freq: Every day | ORAL | 2 refills | Status: DC

## 2022-05-22 NOTE — Patient Instructions (Signed)
  Ms. Mehrhoff , Thank you for taking time to come for your Medicare Wellness Visit. I appreciate your ongoing commitment to your health goals. Please review the following plan we discussed and let me know if I can assist you in the future.   These are the goals we discussed:  Goals   None     This is a list of the screening recommended for you and due dates:  Health Maintenance  Topic Date Due   Colon Cancer Screening  Never done   Mammogram  Never done   Flu Shot  06/15/2022*   Medicare Annual Wellness Visit  05/22/2023   DTaP/Tdap/Td vaccine (2 - Td or Tdap) 03/17/2024   Hepatitis C Screening: USPSTF Recommendation to screen - Ages 76-79 yo.  Completed   HIV Screening  Completed   HPV Vaccine  Aged Out   Pap Smear  Discontinued   COVID-19 Vaccine  Discontinued   Zoster (Shingles) Vaccine  Discontinued  *Topic was postponed. The date shown is not the original due date.

## 2022-05-22 NOTE — Telephone Encounter (Signed)
   Telephone encounter was:  Unsuccessful.  05/22/2022 Name: Leaann Ranck MRN: [id number] DOB: Oct 25, 1965  Unsuccessful outbound call made today to assist with:  Financial Difficulties related to rent.  Outreach Attempt:  1st Attempt  Unable to leave message voicemail not setup.  [place] Resource Care Guide   [email or web address]  ?? [phone]   [email or web address]: [email or web address]

## 2022-05-22 NOTE — Progress Notes (Signed)
Subjective:   Natalie Edwards is a 57 y.o. female who presents for Medicare Annual (Subsequent) preventive examination.  Review of Systems    Refer to PCP       Objective:    Today's Vitals   05/22/22 1343  BP: 117/79  Pulse: 94  Resp: 16  Temp: 98.1 F (36.7 C)  TempSrc: Oral  SpO2: 93%  Weight: 167 lb 6.4 oz (75.9 kg)  Height: '5\' 3"'$  (1.6 m)   Body mass index is 29.65 kg/m.     11/29/2021    6:31 AM 11/28/2021    6:54 AM 03/08/2019    8:16 AM 07/07/2018   10:08 AM 06/03/2018    6:47 AM 07/31/2016    4:28 PM  Advanced Directives  Does Patient Have a Medical Advance Directive? No No No No No No  Would patient like information on creating a medical advance directive? No - Patient declined No - Patient declined No - Patient declined No - Patient declined No - Patient declined No - Patient declined    Current Medications (verified) Outpatient Encounter Medications as of 05/22/2022  Medication Sig   BELSOMRA 20 MG TABS Take 1 tablet by mouth at bedtime.   temazepam (RESTORIL) 30 MG capsule Take 30 mg by mouth at bedtime as needed.   amLODipine-benazepril (LOTREL) 10-40 MG capsule TAKE 1 CAPSULE BY MOUTH DAILY   amoxicillin-clavulanate (AUGMENTIN) 875-125 MG tablet Take 1 tablet by mouth every 12 (twelve) hours for 10 days.   esomeprazole (NEXIUM) 40 MG capsule Take 1 capsule (40 mg total) by mouth 2 (two) times daily.   hydrochlorothiazide (HYDRODIURIL) 25 MG tablet TAKE 1 TABLET BY MOUTH DAILY   loratadine (CLARITIN) 10 MG tablet Take 1 tablet (10 mg total) by mouth daily.   mirtazapine (REMERON) 30 MG tablet Take 1 tablet (30 mg total) by mouth at bedtime.   promethazine-dextromethorphan (PROMETHAZINE-DM) 6.25-15 MG/5ML syrup Take 5 mLs by mouth 4 (four) times daily as needed for cough.   zolpidem (AMBIEN CR) 12.5 MG CR tablet Take 1 tablet (12.5 mg total) by mouth at bedtime as needed for sleep.   No facility-administered encounter medications on file as of 05/22/2022.     Allergies (verified) Hydroxyzine and Ambien [zolpidem tartrate]   History: Past Medical History:  Diagnosis Date   Allergy    Anemia    Diverticulosis    Gall bladder stones 1993   Gastric ulcer    GERD (gastroesophageal reflux disease)    Hypertension    Renal mass    Past Surgical History:  Procedure Laterality Date   ABDOMINAL HYSTERECTOMY     CHOLECYSTECTOMY     Family History  Problem Relation Age of Onset   Cancer Maternal Uncle        Lung   Cancer Maternal Uncle        Lung   Cancer Maternal Uncle        Lung   Headache Neg Hx        "I don't think so"   Migraines Neg Hx        "I don't think so"   Social History   Socioeconomic History   Marital status: Single    Spouse name: Not on file   Number of children: 3   Years of education: Not on file   Highest education level: High school graduate  Occupational History   Not on file  Tobacco Use   Smoking status: Every Day    Packs/day: 0.25  Years: 25.00    Total pack years: 6.25    Types: Cigarettes   Smokeless tobacco: Never   Tobacco comments:    Pt tried to quit - helps with anxiety   Vaping Use   Vaping Use: Never used  Substance and Sexual Activity   Alcohol use: Yes   Drug use: No   Sexual activity: Yes    Comment: hysterectomy  Other Topics Concern   Not on file  Social History Narrative   Lives at home in an apartment. Her mother lives with her.    Right handed   Caffeine: drinks approx. 36 oz of pepsi per day. Sometimes drinks 2 cups of coffee in a day as well.    Social Determinants of Health   Financial Resource Strain: High Risk (05/22/2022)   Overall Financial Resource Strain (CARDIA)    Difficulty of Paying Living Expenses: Very hard  Food Insecurity: No Food Insecurity (05/22/2022)   Hunger Vital Sign    Worried About Running Out of Food in the Last Year: Never true    Ran Out of Food in the Last Year: Never true  Transportation Needs: No Transportation Needs  (05/22/2022)   PRAPARE - Hydrologist (Medical): No    Lack of Transportation (Non-Medical): No  Physical Activity: Sufficiently Active (05/22/2022)   Exercise Vital Sign    Days of Exercise per Week: 4 days    Minutes of Exercise per Session: 70 min  Stress: Stress Concern Present (05/22/2022)   [place]    Feeling of Stress : Very much  Social Connections: Moderately Isolated (05/22/2022)   Social Connection and Isolation Panel [NHANES]    Frequency of Communication with Friends and Family: More than three times a week    Frequency of Social Gatherings with Friends and Family: Never    Attends Religious Services: Never    Marine scientist or Organizations: Yes    Attends Archivist Meetings: Never    Marital Status: Never married    Tobacco Counseling Ready to quit: Not Answered Counseling given: Not Answered Tobacco comments: Pt tried to quit - helps with anxiety    Clinical Intake:  Pre-visit preparation completed: No  Pain : No/denies pain     Nutritional Risks: None Diabetes: No      Interpreter Needed?: No      Activities of Daily Living    05/22/2022    1:46 PM  In your present state of health, do you have any difficulty performing the following activities:  Hearing? 0  Vision? 0  Difficulty concentrating or making decisions? 0  Walking or climbing stairs? 0  Dressing or bathing? 0  Doing errands, shopping? 0    Patient Care Team: Dorna Mai, MD as PCP - General (Family Medicine)  Indicate any recent Medical Services you may have received from other than [facility] providers in the past year (date may be approximate).     Assessment:   This is a routine wellness examination for [name].  Hearing/Vision screen No results found.  Dietary issues and exercise activities discussed:     Goals Addressed   None   Depression Screen    05/22/2022     1:46 PM 03/03/2022    9:37 AM 01/14/2021    9:32 AM 05/12/2019    3:50 PM 04/07/2019    4:09 PM 09/09/2018   10:24 AM 06/07/2018   11:09 AM  PHQ 2/9  Scores  PHQ - 2 Score 0 '4 5 6 5 4 6  '$ PHQ- 9 Score 0 '13 16 18 22 12 23    '$ Fall Risk    05/22/2022    1:45 PM 12/13/2021    2:34 PM 04/14/2016    2:08 PM  Fall Risk   Falls in the past year? 0 0 No  Number falls in past yr: 0 0   Injury with Fall? 0 0   Risk for fall due to :  No Fall Risks     FALL RISK PREVENTION PERTAINING TO THE HOME:  Any stairs in or around the home? Yes  If so, are there any without handrails? Yes  Home free of loose throw rugs in walkways, pet beds, electrical cords, etc? Yes  Adequate lighting in your home to reduce risk of falls? Yes   ASSISTIVE DEVICES UTILIZED TO PREVENT FALLS:  Life alert? No  Use of a cane, walker or w/c? No  Grab bars in the bathroom? No  Shower chair or bench in shower? No  Elevated toilet seat or a handicapped toilet? No   TIMED UP AND GO:  Was the test performed? No .  Length of time to ambulate 10 feet:  sec.   Gait slow and steady without use of assistive device  Cognitive Function:    05/22/2022    1:47 PM  MMSE - Mini Mental State Exam  Orientation to time 5  Orientation to Place 5  Registration 3  Attention/ Calculation 5  Recall 3  Language- name 2 objects 2  Language- repeat 1  Language- follow 3 step command 3  Language- read & follow direction 1  Write a sentence 1        05/22/2022    1:45 PM  6CIT Screen  What Year? 4 points  What month? 3 points  What time? 3 points  Count back from 20 4 points  Months in reverse 4 points  Repeat phrase 10 points  Total Score 28 points    Immunizations Immunization History  Administered Date(s) Administered   PFIZER(Purple Top)SARS-COV-2 Vaccination 06/20/2019, 07/12/2019   Tdap 03/17/2014    TDAP status: Patient wishes to receive this vaccine today after disclosing that insurance does not cover the  vaccine as a preventative vaccine.   Flu Vaccine status: Completed at today's visit  Pneumococcal vaccine status: Declined,  Education has been provided regarding the importance of this vaccine but patient still declined. Advised may receive this vaccine at local pharmacy or Health Dept. Aware to provide a copy of the vaccination record if obtained from local pharmacy or Health Dept. Verbalized acceptance and understanding.   Covid-19 vaccine status: Declined, Education has been provided regarding the importance of this vaccine but patient still declined. Advised may receive this vaccine at local pharmacy or Health Dept.or vaccine clinic. Aware to provide a copy of the vaccination record if obtained from local pharmacy or Health Dept. Verbalized acceptance and understanding.  Qualifies for Shingles Vaccine? No   Zostavax completed No   Shingrix Completed?: No.    Education has been provided regarding the importance of this vaccine. Patient has been advised to call insurance company to determine out of pocket expense if they have not yet received this vaccine. Advised may also receive vaccine at local pharmacy or Health Dept. Verbalized acceptance and understanding.  Screening Tests Health Maintenance  Topic Date Due   COLONOSCOPY (Pts 45-39yr Insurance coverage will need to be confirmed)  Never done  MAMMOGRAM  Never done   INFLUENZA VACCINE  06/15/2022 (Originally 10/15/2021)   Medicare Annual Wellness (AWV)  05/22/2023   DTaP/Tdap/Td (2 - Td or Tdap) 03/17/2024   Hepatitis C Screening  Completed   HIV Screening  Completed   HPV VACCINES  Aged Out   PAP SMEAR-Modifier  Discontinued   COVID-19 Vaccine  Discontinued   Zoster Vaccines- Shingrix  Discontinued    Health Maintenance  Health Maintenance Due  Topic Date Due   COLONOSCOPY (Pts 45-98yr Insurance coverage will need to be confirmed)  Never done   MAMMOGRAM  Never done    Colorectal cancer screening: Referral to GI placed  05/22/2022. Pt aware the office will call re: appt.  Mammogram status: Ordered 05/22/2022. Pt provided with contact info and advised to call to schedule appt.     Lung Cancer Screening: (Low Dose CT Chest recommended if Age 41101-80years, 30 pack-year currently smoking OR have quit w/in 15years.) does qualify.   Lung Cancer Screening Referral: n/a  Additional Screening:  Hepatitis C Screening: does not qualify; Completed [id number] Vision Screening: Recommended annual ophthalmology exams for early detection of glaucoma and other disorders of the eye. Is the patient up to date with their annual eye exam?  No  Who is the provider or what is the name of the office in which the patient attends annual eye exams?  If pt is not established with a provider, would they like to be referred to a provider to establish care? No .   Dental Screening: Recommended annual dental exams for proper oral hygiene  Community Resource Referral / Chronic Care Management: CRR required this visit?  No   CCM required this visit?  No      Plan:     I have personally reviewed and noted the following in the patient's chart:   Medical and social history Use of alcohol, tobacco or illicit drugs  Current medications and supplements including opioid prescriptions. Patient is not currently taking opioid prescriptions. Functional ability and status Nutritional status Physical activity Advanced directives List of other physicians Hospitalizations, surgeries, and ER visits in previous 12 months Vitals Screenings to include cognitive, depression, and falls Referrals and appointments  In addition, I have reviewed and discussed with patient certain preventive protocols, quality metrics, and best practice recommendations. A written personalized care plan for preventive services as well as general preventive health recommendations were provided to patient.     AMelene Plan RMA   05/22/2022   Nurse Notes:

## 2022-05-23 ENCOUNTER — Telehealth: Payer: Self-pay | Admitting: *Deleted

## 2022-05-23 NOTE — Progress Notes (Unsigned)
  Care Coordination  Outreach Note  05/23/2022 Name: Neve Branscomb MRN: [id number] DOB: Apr 01, 1965   Care Coordination Outreach Attempts: An unsuccessful telephone outreach was attempted today to offer the patient information about available care coordination services as a benefit of their health plan.   Follow Up Plan:  Additional outreach attempts will be made to offer the patient care coordination information and services.   Encounter Outcome:  No Answer  [place]  Direct Dial: [phone]

## 2022-05-26 ENCOUNTER — Telehealth: Payer: Self-pay

## 2022-05-26 NOTE — Telephone Encounter (Signed)
   Telephone encounter was:  Successful.  05/26/2022 Name: Natalie Edwards MRN: [id number] DOB: 13-Apr-1965  Natalie Edwards is a 57 y.o. year old female who is a primary care patient of Dorna Mai, MD . The community resource team was consulted for assistance with Financial Difficulties related to rent.  Care guide performed the following interventions: Spoke with patient about [facility] for Housing and Community/Eviction Mediation. She consented to sending a [place] referral. Also emailed information for [facility] for Housing and [employer].  [place] and [employer] both are out of funds.  Follow Up Plan:  Care guide will follow up with patient by phone over the next 7 days.  [place] Resource Care Guide   [email or web address]  ?? [phone]   Website: [email or web address]  [email or web address]

## 2022-05-26 NOTE — Progress Notes (Signed)
  Care Coordination   Note   05/26/2022 Name: Aydee Mcnew MRN: [id number] DOB: October 06, 1965  Madisun Hargrove is a 57 y.o. year old female who sees Dorna Mai, MD for primary care. I reached out to Sharin Grave by phone today to offer care coordination services.  Ms. Mires was given information about Care Coordination services today including:   The Care Coordination services include support from the care team which includes your Nurse Coordinator, Clinical Social Worker, or Pharmacist.  The Care Coordination team is here to help remove barriers to the health concerns and goals most important to you. Care Coordination services are voluntary, and the patient may decline or stop services at any time by request to their care team member.   Care Coordination Consent Status: Patient agreed to services and verbal consent obtained.   Follow up plan:  Telephone appointment with care coordination team member scheduled for:  06/02/22  Encounter Outcome:  Pt. Scheduled  [place]  Direct Dial: [phone]

## 2022-05-27 ENCOUNTER — Encounter: Payer: Self-pay | Admitting: Family Medicine

## 2022-05-27 ENCOUNTER — Other Ambulatory Visit: Payer: Self-pay

## 2022-05-27 NOTE — Progress Notes (Signed)
Established Patient Office Visit  Subjective    Patient ID: Natalie Edwards, female    DOB: 1965-04-23  Age: 57 y.o. MRN: [id number]  CC:  Chief Complaint  Patient presents with   Medicare Wellness    HPI Natalie Edwards presents for routine follow up of chronic med issues. Patient reports increased social stressors as she recently lost her mother and was scammed out of money through her bank.    Outpatient Encounter Medications as of 05/22/2022  Medication Sig   BELSOMRA 20 MG TABS Take 1 tablet by mouth at bedtime.   mirtazapine (REMERON) 45 MG tablet Take 1 tablet (45 mg total) by mouth at bedtime.   temazepam (RESTORIL) 30 MG capsule Take 30 mg by mouth at bedtime as needed.   amLODipine-benazepril (LOTREL) 10-40 MG capsule Take 1 capsule by mouth daily.   [EXPIRED] amoxicillin-clavulanate (AUGMENTIN) 875-125 MG tablet Take 1 tablet by mouth every 12 (twelve) hours for 10 days.   esomeprazole (NEXIUM) 40 MG capsule Take 1 capsule (40 mg total) by mouth 2 (two) times daily.   hydrochlorothiazide (HYDRODIURIL) 25 MG tablet Take 1 tablet (25 mg total) by mouth daily.   loratadine (CLARITIN) 10 MG tablet Take 1 tablet (10 mg total) by mouth daily.   mirtazapine (REMERON) 30 MG tablet Take 1 tablet (30 mg total) by mouth at bedtime.   promethazine-dextromethorphan (PROMETHAZINE-DM) 6.25-15 MG/5ML syrup Take 5 mLs by mouth 4 (four) times daily as needed for cough.   zolpidem (AMBIEN CR) 12.5 MG CR tablet Take 1 tablet (12.5 mg total) by mouth at bedtime as needed for sleep.   [DISCONTINUED] amLODipine-benazepril (LOTREL) 10-40 MG capsule TAKE 1 CAPSULE BY MOUTH DAILY   [DISCONTINUED] hydrochlorothiazide (HYDRODIURIL) 25 MG tablet TAKE 1 TABLET BY MOUTH DAILY   No facility-administered encounter medications on file as of 05/22/2022.    Past Medical History:  Diagnosis Date   Allergy    Anemia    Diverticulosis    Gall bladder stones 1993   Gastric ulcer    GERD (gastroesophageal reflux  disease)    Hypertension    Renal mass     Past Surgical History:  Procedure Laterality Date   ABDOMINAL HYSTERECTOMY     CHOLECYSTECTOMY      Family History  Problem Relation Age of Onset   Cancer Maternal Uncle        Lung   Cancer Maternal Uncle        Lung   Cancer Maternal Uncle        Lung   Headache Neg Hx        "I don't think so"   Migraines Neg Hx        "I don't think so"    Social History   Socioeconomic History   Marital status: Single    Spouse name: Not on file   Number of children: 3   Years of education: Not on file   Highest education level: High school graduate  Occupational History   Not on file  Tobacco Use   Smoking status: Every Day    Packs/day: 0.25    Years: 25.00    Total pack years: 6.25    Types: Cigarettes   Smokeless tobacco: Never   Tobacco comments:    Pt tried to quit - helps with anxiety   Vaping Use   Vaping Use: Never used  Substance and Sexual Activity   Alcohol use: Yes   Drug use: No   Sexual activity: Yes  Comment: hysterectomy  Other Topics Concern   Not on file  Social History Narrative   Lives at home in an apartment. Her mother lives with her.    Right handed   Caffeine: drinks approx. 36 oz of pepsi per day. Sometimes drinks 2 cups of coffee in a day as well.    Social Determinants of Health   Financial Resource Strain: High Risk (05/22/2022)   Overall Financial Resource Strain (CARDIA)    Difficulty of Paying Living Expenses: Very hard  Food Insecurity: No Food Insecurity (05/22/2022)   Hunger Vital Sign    Worried About Running Out of Food in the Last Year: Never true    Ran Out of Food in the Last Year: Never true  Transportation Needs: No Transportation Needs (05/22/2022)   PRAPARE - Hydrologist (Medical): No    Lack of Transportation (Non-Medical): No  Physical Activity: Sufficiently Active (05/22/2022)   Exercise Vital Sign    Days of Exercise per Week: 4 days     Minutes of Exercise per Session: 70 min  Stress: Stress Concern Present (05/22/2022)   [place]    Feeling of Stress : Very much  Social Connections: Moderately Isolated (05/22/2022)   Social Connection and Isolation Panel [NHANES]    Frequency of Communication with Friends and Family: More than three times a week    Frequency of Social Gatherings with Friends and Family: Never    Attends Religious Services: Never    Marine scientist or Organizations: Yes    Attends Archivist Meetings: Never    Marital Status: Never married  Intimate Partner Violence: Not At Risk (05/22/2022)   Humiliation, Afraid, Rape, and Kick questionnaire    Fear of Current or Ex-Partner: No    Emotionally Abused: No    Physically Abused: No    Sexually Abused: No    Review of Systems  All other systems reviewed and are negative.       Objective    BP 117/79   Pulse 94   Temp 98.1 F (36.7 C) (Oral)   Resp 16   Ht '5\' 3"'$  (1.6 m)   Wt 167 lb 6.4 oz (75.9 kg)   SpO2 93%   BMI 29.65 kg/m   Physical Exam Vitals and nursing note reviewed.  Constitutional:      General: She is not in acute distress. Cardiovascular:     Rate and Rhythm: Normal rate and regular rhythm.  Pulmonary:     Effort: Pulmonary effort is normal.     Breath sounds: Normal breath sounds.  Abdominal:     Palpations: Abdomen is soft.     Tenderness: There is no abdominal tenderness.  Neurological:     General: No focal deficit present.     Mental Status: She is alert and oriented to person, place, and time.  Psychiatric:        Mood and Affect: Mood and affect normal.        Behavior: Behavior is cooperative.         Assessment & Plan:   1. Encounter for Medicare annual wellness exam  - AMB Referral to [facility] (ACO Patients)  2. Primary hypertension Appears stable. Continue and monitor. Meds refilled.  -  hydrochlorothiazide (HYDRODIURIL) 25 MG tablet; Take 1 tablet (25 mg total) by mouth daily.  Dispense: 30 tablet; Refill: 2 - amLODipine-benazepril (LOTREL) 10-40 MG capsule; Take 1  capsule by mouth daily.  Dispense: 30 capsule; Refill: 2  3. Anxiety and depression Remeron increased from 30 mg to 45 mg daily  - AMB Referral to [place] (ACO Patients)  4. Insomnia due to other mental disorder Mirtazapine as above  5. Screening for colon cancer  - Ambulatory referral to Gastroenterology  6. Encounter for screening mammogram for malignant neoplasm of breast  - MM 3D SCREENING MAMMOGRAM BILATERAL BREAST; Future    Return in 1 year (on 05/22/2023).   Becky Sax, MD

## 2022-05-28 ENCOUNTER — Telehealth: Payer: Self-pay

## 2022-05-28 NOTE — Telephone Encounter (Signed)
   Telephone encounter was:  Successful.  05/28/2022 Name: Natalie Edwards MRN: [id number] DOB: 08-18-1965  Natalie Edwards is a 57 y.o. year old female who is a primary care patient of Dorna Mai, MD . The community resource team was consulted for assistance with Financial Difficulties related to rent.  Care guide performed the following interventions: Spoke with patient she has spoken to [facility] for Housing and was informed they were out of funds. They can only mediate with her landlord after she has received an eviction notice.  Patient stated she would call [place] emergency assistance program. Emailed information for [employer] legal assistance program. [employer] and [employer] are out of funds.  Follow Up Plan:  No further follow up planned at this time. The patient has been provided with needed resources.  [place] Resource Care Guide   [email or web address]  ?? [phone]   Website: [email or web address]  [email or web address]

## 2022-05-30 DIAGNOSIS — M542 Cervicalgia: Secondary | ICD-10-CM | POA: Diagnosis not present

## 2022-06-02 ENCOUNTER — Encounter: Payer: Self-pay | Admitting: *Deleted

## 2022-06-02 ENCOUNTER — Telehealth: Payer: Self-pay | Admitting: *Deleted

## 2022-06-02 NOTE — Patient Outreach (Signed)
  Care Coordination   06/02/2022 Name: Natalie Edwards MRN: [id number] DOB: [birth year]   Care Coordination Outreach Attempts:  An unsuccessful telephone outreach was attempted today to offer the patient information about available care coordination services as a benefit of their health plan.   Follow Up Plan:  Additional outreach attempts will be made to offer the patient care coordination information and services.   Encounter Outcome:  No Answer   Care Coordination Interventions:  No, not indicated    Eduard Clos, MSW, [place] Worker [facility] [phone]

## 2022-06-04 ENCOUNTER — Telehealth: Payer: Self-pay | Admitting: *Deleted

## 2022-06-04 NOTE — Progress Notes (Signed)
  Care Coordination Note  06/04/2022 Name: Natalie Edwards MRN: [id number] DOB: [birth year]  [name] is a 57 y.o. year old female who is a primary care patient of Dorna Mai, MD and is actively engaged with the care management team. I reached out to Sharin Grave by phone today to assist with re-scheduling a follow up visit with the Licensed Clinical Social Worker  Follow up plan: Unsuccessful telephone outreach attempt made.  [place]  Direct Dial: [phone]

## 2022-06-06 NOTE — Progress Notes (Signed)
  Care Coordination Note  06/06/2022 Name: Natalie Edwards MRN: [id number] DOB: 1965-05-10  [name] is a 57 y.o. year old female who is a primary care patient of Dorna Mai, MD and is actively engaged with the care management team. I reached out to Sharin Grave by phone today to assist with re-scheduling an initial visit with the Licensed Clinical Social Worker  Follow up plan: Telephone appointment with care management team member scheduled for:06/16/22  [place]  Direct Dial: [phone]

## 2022-06-08 ENCOUNTER — Other Ambulatory Visit: Payer: Self-pay | Admitting: Gastroenterology

## 2022-06-09 MED ORDER — ESOMEPRAZOLE MAGNESIUM 40 MG PO CPDR: 40.0000 mg | DELAYED_RELEASE_CAPSULE | Freq: Two times a day (BID) | ORAL | 0 refills | Status: DC

## 2022-06-16 ENCOUNTER — Ambulatory Visit: Payer: Self-pay | Admitting: *Deleted

## 2022-06-16 NOTE — Patient Instructions (Signed)
Visit Information  Thank you for taking time to visit with me today. Please don't hesitate to contact me if I can be of assistance to you.   Following are the goals we discussed today:   Goals Addressed             This Visit's Progress    Reduce stress related to financial strains       Activities and task to complete in order to accomplish goals.   Follow up with housing resources discussed (call to followup on all possible leads and check back in for updates periodically) Follow up on food resources discussed (food pantry, etc ) Expect phone call from [facility] to schedule your counseling appointment. Keep all upcoming appointments   Continue with compliance of taking medication prescribed by Doctor Self Support options  (seek assistance from Landlord, community resource/agency, et as discussed )  Expect phone call from [employer] [place] agencies Seek alternative work opportunities at current employer as well as other- [employer], [place] next appointment is by telephone on 06/26/22   Please call the care guide team at [phone] if you need to cancel or reschedule your appointment.   If you are experiencing a Mental Health or [place] or need someone to talk to, please call the Suicide and Crisis Lifeline: 988 call 911   The patient verbalized understanding of instructions, educational materials, and care plan provided today and DECLINED offer to receive copy of patient instructions, educational materials, and care plan.   Telephone follow up appointment with care management team member scheduled for: 06/26/22  Eduard Clos, MSW, [place] Worker [facility] [employer] [facility] [phone]

## 2022-06-16 NOTE — Patient Outreach (Signed)
  Care Coordination   Initial Visit Note   06/16/2022 Name: Natalie Edwards MRN: [id number] DOB: 12/22/1965  Natalie Edwards is a 57 y.o. year old female who sees Dorna Mai, MD for primary care. I spoke with  Sharin Grave by phone today.  What matters to the patients health and wellness today?  Stressed and worried about her finances.    Goals Addressed             This Visit's Progress    Reduce stress related to financial strains       Activities and task to complete in order to accomplish goals.   Follow up with housing resources discussed (call to followup on all possible leads and check back in for updates periodically) Follow up on food resources discussed (food pantry, etc ) Expect phone call from [facility] to schedule your counseling appointment. Keep all upcoming appointments   Continue with compliance of taking medication prescribed by Doctor Self Support options  (seek assistance from Landlord, community resource/agency, et as discussed )  Expect phone call from [employer] [place] agencies Seek alternative work opportunities at current employer as well as other- [employer], [employer]        SDOH assessments and interventions completed:  Yes     Care Coordination Interventions:  Yes, provided  Interventions Today    Flowsheet Row Most Recent Value  Chronic Disease   Chronic disease during today's visit Hypertension (HTN)  General Interventions   General Interventions Discussed/Reviewed General Interventions Discussed, General Interventions Reviewed, [employer]  [discussed resources/support to seek assistance with financial strains- providing pt with info/#'s for followup]  Mental Health Interventions   Mental Health Discussed/Reviewed Mental Health Discussed, Mental Health Reviewed, Coping Strategies, Anxiety, Other  [Extensive stress/anxiety currently around finances.]       Follow up plan: Follow up call scheduled for 06/26/22    Encounter  Outcome:  Pt. Visit Completed

## 2022-06-23 DIAGNOSIS — M5412 Radiculopathy, cervical region: Secondary | ICD-10-CM | POA: Diagnosis not present

## 2022-06-26 ENCOUNTER — Ambulatory Visit: Payer: Self-pay | Admitting: *Deleted

## 2022-06-26 NOTE — Patient Instructions (Signed)
Visit Information  Thank you for taking time to visit with me today. Please don't hesitate to contact me if I can be of assistance to you.   Following are the goals we discussed today:   Goals Addressed   None     Our next appointment is by telephone on 07/08/22   Please call the care guide team at [phone] if you need to cancel or reschedule your appointment.   If you are experiencing a Mental Health or Behavioral Health Crisis or need someone to talk to, please call the Suicide and Crisis Lifeline: 988 call 911   The patient verbalized understanding of instructions, educational materials, and care plan provided today and DECLINED offer to receive copy of patient instructions, educational materials, and care plan.   Telephone follow up appointment with care management team member scheduled for:07/08/22  Reece Levy, MSW, LCSW Clinical Social Worker [facility] [phone]

## 2022-06-26 NOTE — Patient Outreach (Addendum)
  Care Coordination   Follow Up Visit Note   06/26/2022 Name: Aisosa Vielma MRN: [id number] DOB: [birth year]  Terra Kimmey is a 57 y.o. year old female who sees [name], MD for primary care. I spoke with  [name] by phone today.  What matters to the patients health and wellness today?  "Filing for bankruptcy"    Goals Addressed             This Visit's Progress    Reduce stress related to financial strains       Activities and task to complete in order to accomplish goals.   Follow up with housing resources discussed (call to followup on all possible leads and check back in for updates periodically) Follow up on food resources discussed (food pantry, etc ) Pursue counseling- Expect phone call from [facility] to schedule your counseling appointment. Keep all upcoming appointments   Continue with compliance of taking medication prescribed by Doctor Self Support options  (seek assistance from Landlord, community resource/agency, et as discussed )  Expect phone call from [employer] agencies Seek alternative work opportunities at current employer as well as other- [employer], [employer]        SDOH assessments and interventions completed:  Yes     Care Coordination Interventions:  Yes, provided  Interventions Today    Flowsheet Row Most Recent Value  General Interventions   General Interventions Discussed/Reviewed General Interventions Discussed, Community Resources  Mental Health Interventions   Mental Health Discussed/Reviewed Mental Health Discussed, Mental Health Reviewed, Coping Strategies, Depression  [CSW called [facility] to check referral- expect call to schedule initial]       Follow up plan: Follow up call scheduled for 07/08/22    Encounter Outcome:  Pt. Visit Completed

## 2022-07-02 ENCOUNTER — Ambulatory Visit: Payer: 59 | Admitting: Family Medicine

## 2022-07-08 ENCOUNTER — Ambulatory Visit: Payer: 59

## 2022-07-08 ENCOUNTER — Ambulatory Visit: Payer: Self-pay | Admitting: *Deleted

## 2022-07-08 NOTE — Patient Outreach (Signed)
  Care Coordination   Follow Up Visit Note   07/08/2022 Name: Natalie Edwards MRN: [id number] DOB: June 02, 1965  Natalie Edwards is a 57 y.o. year old female who sees [name], MD for primary care. I spoke with  Natalie Edwards by phone today.  What matters to the patients health and wellness today?  Car was repossessed; need to come up with money for renewing my rent agreement and consider housing and other options.    Goals Addressed             This Visit's Progress    Reduce stress related to financial strains       Activities and task to complete in order to accomplish goals.   Follow up with housing resources discussed (call to followup on all possible leads and check back in for updates periodically) Follow up on food resources discussed (food pantry, etc ) Pursue counseling- complete paperwork being emailed to you from [facility] to schedule your counseling appointment. Keep all upcoming appointments   Continue with compliance of taking medication prescribed by Doctor Self Support options  (continue to seek assistance from Landlord, community resource/agency, etc as discussed )  Consider alternative housing options as you mentioned Seek alternative work opportunities at current employer as well as other- [employer], [employer]        SDOH assessments and interventions completed:  Yes     Care Coordination Interventions:  Yes, provided  Interventions Today    Flowsheet Row Most Recent Value  Chronic Disease   Chronic disease during today's visit Hypertension (HTN)  General Interventions   General Interventions Discussed/Reviewed Community Resources  [referred to [employer] for counseling- pt to complete portal for appointment to be arranged]  Mental Health Interventions   Mental Health Discussed/Reviewed Mental Health Discussed, Coping Strategies, Depression, Other  [financial strains]       Follow up plan: Follow up call scheduled for 07/28/22     Encounter Outcome:  Pt. Visit Completed

## 2022-07-08 NOTE — Patient Instructions (Signed)
Visit Information  Thank you for taking time to visit with me today. Please don't hesitate to contact me if I can be of assistance to you.   Following are the goals we discussed today:   Goals Addressed             This Visit's Progress    Reduce stress related to financial strains       Activities and task to complete in order to accomplish goals.   Follow up with housing resources discussed (call to followup on all possible leads and check back in for updates periodically) Follow up on food resources discussed (food pantry, etc ) Pursue counseling- complete paperwork being emailed to you from [facility] to schedule your counseling appointment. Keep all upcoming appointments   Continue with compliance of taking medication prescribed by Doctor Self Support options  (continue to seek assistance from [facility], community resource/agency, etc as discussed )  Consider alternative housing options as you mentioned Seek alternative work opportunities at current employer as well as other- [employer], [employer]        Our next appointment is by telephone on 07/28/22    Please call the care guide team at [phone] if you need to cancel or reschedule your appointment.   If you are experiencing a Mental Health or Behavioral Health Crisis or need someone to talk to, please call the Suicide and Crisis Lifeline: 988 call 911   The patient verbalized understanding of instructions, educational materials, and care plan provided today and DECLINED offer to receive copy of patient instructions, educational materials, and care plan.   Telephone follow up appointment with care management team member scheduled for: 07/28/22  Reece Levy, MSW, LCSW Clinical Social Worker [facility] [phone]

## 2022-07-09 DIAGNOSIS — M5412 Radiculopathy, cervical region: Secondary | ICD-10-CM | POA: Diagnosis not present

## 2022-07-10 ENCOUNTER — Ambulatory Visit: Payer: 59 | Admitting: Pulmonary Disease

## 2022-07-10 ENCOUNTER — Telehealth: Payer: Self-pay | Admitting: Pulmonary Disease

## 2022-07-10 NOTE — Telephone Encounter (Signed)
PT was late for appt today and has resched. She needs a refill on her temazepam she "thinks" You may need to call to verify. I showed her her med list and she pointed to this one. She is out now.  Phar: [facility]   Her # is [phone]

## 2022-07-11 NOTE — Telephone Encounter (Signed)
Spoke with patient she is requesting a refill on Temazepam. Dr. Val Eagle please advise  Pharmacy [facility]

## 2022-07-14 ENCOUNTER — Other Ambulatory Visit: Payer: Self-pay | Admitting: Pulmonary Disease

## 2022-07-16 ENCOUNTER — Other Ambulatory Visit: Payer: Self-pay | Admitting: Pulmonary Disease

## 2022-07-16 MED ORDER — TEMAZEPAM 30 MG PO CAPS: 30.0000 mg | ORAL_CAPSULE | Freq: Every evening | ORAL | 3 refills | Status: DC | PRN

## 2022-07-16 NOTE — Telephone Encounter (Signed)
Temazepam refill sent to pharmacy

## 2022-07-16 NOTE — Telephone Encounter (Signed)
Spoke with patient. Advised Temazepam refill has been sent to pharmacy. NFN

## 2022-07-22 ENCOUNTER — Encounter: Payer: Self-pay | Admitting: Pulmonary Disease

## 2022-07-22 ENCOUNTER — Ambulatory Visit (INDEPENDENT_AMBULATORY_CARE_PROVIDER_SITE_OTHER): Payer: 59 | Admitting: Pulmonary Disease

## 2022-07-22 VITALS — BP 108/78 | HR 97 | Ht 63.0 in | Wt 172.4 lb

## 2022-07-22 DIAGNOSIS — F5104 Psychophysiologic insomnia: Secondary | ICD-10-CM

## 2022-07-22 MED ORDER — BELSOMRA 20 MG PO TABS: 1.0000 | ORAL_TABLET | Freq: Every day | ORAL | 2 refills | Status: DC

## 2022-07-22 NOTE — Progress Notes (Signed)
Subjective:    Patient ID: Natalie Edwards, female    DOB: 1965/10/12, 57 y.o.   MRN: [id number]  Patient with a history of chronic insomnia  Patient with chronic insomnia, difficult to treat   Evaluated in the past about 5 years ago  Restoril has been helping-uses 30 mg nightly -Runs out of medications, Uses Belsomra in addition  Using both medications have resulted in getting about 6 hours of sleep Does not wake up feeling groggy Feels she gets good enough sleep, when she wakes up in the middle of the night she is able to go back to sleep  She continues to feel about the same Trying not to use both medications at the same time  She is doing the third shift at present There has been no change in schedule, this has helped her sleep a little bit Still with some difficulty falling asleep and maintaining sleep  Continues to smoke which may not be helping as sleep as well Smokes about a pack of cigarettes in 3 days  She does have a history of snoring, no witnessed apneas She is only sleepy during the day when she does not get adequate rest at night Occasional dryness of her mouth in the mornings She does have headaches  No family history of sleep disordered breathing  Has a lot of stressors   Continues to smoke actively Past Medical History:  Diagnosis Date   Allergy    Anemia    Diverticulosis    Gall bladder stones 1993   Gastric ulcer    GERD (gastroesophageal reflux disease)    Hypertension    Renal mass    Social History   Socioeconomic History   Marital status: Single    Spouse name: Not on file   Number of children: 3   Years of education: Not on file   Highest education level: High school graduate  Occupational History   Not on file  Tobacco Use   Smoking status: Every Day    Packs/day: 0.25    Years: 25.00    Additional pack years: 0.00    Total pack years: 6.25    Types: Cigarettes   Smokeless tobacco: Never   Tobacco comments:    Pt tried to  quit - helps with anxiety     1 pack last patient 3 days   Vaping Use   Vaping Use: Never used  Substance and Sexual Activity   Alcohol use: Yes   Drug use: No   Sexual activity: Yes    Comment: hysterectomy  Other Topics Concern   Not on file  Social History Narrative   Lives at home in an apartment. Her mother lives with her.    Right handed   Caffeine: drinks approx. 36 oz of pepsi per day. Sometimes drinks 2 cups of coffee in a day as well.    Social Determinants of Health   Financial Resource Strain: High Risk (05/22/2022)   Overall Financial Resource Strain (CARDIA)    Difficulty of Paying Living Expenses: Very hard  Food Insecurity: No Food Insecurity (06/16/2022)   Hunger Vital Sign    Worried About Running Out of Food in the Last Year: Never true    Ran Out of Food in the Last Year: Never true  Transportation Needs: No Transportation Needs (05/22/2022)   PRAPARE - Administrator, Civil Service (Medical): No    Lack of Transportation (Non-Medical): No  Physical Activity: Sufficiently Active (05/22/2022)   Exercise  Vital Sign    Days of Exercise per Week: 4 days    Minutes of Exercise per Session: 70 min  Stress: Stress Concern Present (05/22/2022)   [employer] of Occupational Health - Occupational Stress Questionnaire    Feeling of Stress : Very much  Social Connections: Moderately Isolated (05/22/2022)   Social Connection and Isolation Panel [NHANES]    Frequency of Communication with Friends and Family: More than three times a week    Frequency of Social Gatherings with Friends and Family: Never    Attends Religious Services: Never    Database administrator or Organizations: Yes    Attends Banker Meetings: Never    Marital Status: Never married  Intimate Partner Violence: Not At Risk (05/22/2022)   Humiliation, Afraid, Rape, and Kick questionnaire    Fear of Current or Ex-Partner: No    Emotionally Abused: No    Physically Abused: No     Sexually Abused: No   Family History  Problem Relation Age of Onset   Cancer Maternal Uncle        Lung   Cancer Maternal Uncle        Lung   Cancer Maternal Uncle        Lung   Headache Neg Hx        "I don't think so"   Migraines Neg Hx        "I don't think so"   Review of Systems  Constitutional:  Negative for fever and unexpected weight change.  HENT:  Negative for congestion, dental problem, ear pain, nosebleeds, postnasal drip, rhinorrhea, sinus pressure, sneezing, sore throat and trouble swallowing.   Eyes:  Negative for redness and itching.  Respiratory: Negative.  Negative for apnea.   Cardiovascular: Negative.   Gastrointestinal: Negative.   Genitourinary:  Negative for dysuria.  Musculoskeletal:  Negative for joint swelling.  Skin:  Negative for rash.  Allergic/Immunologic: Negative.  Negative for environmental allergies, food allergies and immunocompromised state.  Neurological:  Negative for headaches.  Hematological:  Does not bruise/bleed easily.  Psychiatric/Behavioral:  Negative for dysphoric mood. The patient is nervous/anxious.       Objective:   Physical Exam Constitutional:      Appearance: Normal appearance.  HENT:     Head: Normocephalic.     Mouth/Throat:     Mouth: Mucous membranes are moist.     Comments: Mallampati 1, Eyes:     General:        Right eye: No discharge.        Left eye: No discharge.  Cardiovascular:     Rate and Rhythm: Normal rate and regular rhythm.     Pulses: Normal pulses.     Heart sounds: No murmur heard.    No friction rub.  Pulmonary:     Effort: Pulmonary effort is normal. No respiratory distress.     Breath sounds: No stridor. No wheezing or rhonchi.  Musculoskeletal:     Cervical back: No rigidity or tenderness.  Neurological:     Mental Status: She is alert.  Psychiatric:        Mood and Affect: Mood normal.   Vitals:   07/22/22 1542  BP: 108/78  Pulse: 97  SpO2: 96%       Assessment & Plan:    Patient with sleep onset and sleep maintenance insomnia -Continues to use Restoril and Belsomra  History of depression and anxiety -Continue to optimize treatment  Nicotine addiction -She continues to  work on quitting smoking  Plan: Prescription for Restoril recently sent in  Prescription for Belsomra provided  Encourage stimulus control  Follow-up in about 6 months  Risk with using  multiple sleep aids discussed

## 2022-07-22 NOTE — Patient Instructions (Addendum)
I will see you back in 6 months  Continue to work on quitting smoking  Prescription refill sent in for Belsomra   Your temazepam was recently refilled on 07/16/2022

## 2022-07-28 ENCOUNTER — Ambulatory Visit: Payer: 59 | Admitting: Gastroenterology

## 2022-07-29 ENCOUNTER — Encounter: Payer: Self-pay | Admitting: *Deleted

## 2022-07-31 ENCOUNTER — Ambulatory Visit (INDEPENDENT_AMBULATORY_CARE_PROVIDER_SITE_OTHER): Payer: 59 | Admitting: Family Medicine

## 2022-07-31 ENCOUNTER — Telehealth: Payer: Self-pay | Admitting: *Deleted

## 2022-07-31 ENCOUNTER — Encounter: Payer: Self-pay | Admitting: Family Medicine

## 2022-07-31 VITALS — BP 110/73 | HR 81 | Temp 95.0°F | Resp 16 | Wt 171.6 lb

## 2022-07-31 DIAGNOSIS — G8929 Other chronic pain: Secondary | ICD-10-CM

## 2022-07-31 DIAGNOSIS — M542 Cervicalgia: Secondary | ICD-10-CM

## 2022-07-31 DIAGNOSIS — F32A Depression, unspecified: Secondary | ICD-10-CM | POA: Diagnosis not present

## 2022-07-31 DIAGNOSIS — F419 Anxiety disorder, unspecified: Secondary | ICD-10-CM

## 2022-07-31 DIAGNOSIS — M5412 Radiculopathy, cervical region: Secondary | ICD-10-CM | POA: Diagnosis not present

## 2022-07-31 MED ORDER — MIRTAZAPINE 45 MG PO TABS: 45.0000 mg | ORAL_TABLET | Freq: Every day | ORAL | 1 refills | Status: DC

## 2022-07-31 NOTE — Patient Outreach (Signed)
  Care Coordination  Late Entry 07/31/2022 Name: Sheniah Demske MRN: [id number] DOB: 02/22/1966   Care Coordination Outreach Attempts:  A second unsuccessful outreach was attempted today to offer the patient with information about available care coordination services.  Follow Up Plan:  Additional outreach attempts will be made to offer the patient care coordination information and services.   Encounter Outcome:  No Answer   Care Coordination Interventions:  No, not indicated    Reece Levy, MSW, LCSW Clinical Social Worker [facility] [phone]

## 2022-08-01 ENCOUNTER — Encounter: Payer: Self-pay | Admitting: Family Medicine

## 2022-08-01 NOTE — Progress Notes (Signed)
Established Patient Office Visit  Subjective    Patient ID: Natalie Edwards, female    DOB: [birth year]  Age: 57 y.o. MRN: [id number]  CC:  Chief Complaint  Patient presents with   Follow-up    HPI Natalie Edwards presents for follow up of anxiety and depression. She reports much improvement with present management. She also reports neck issues for which she sees Research scientist (medical).    Outpatient Encounter Medications as of 07/31/2022  Medication Sig   amLODipine-benazepril (LOTREL) 10-40 MG capsule Take 1 capsule by mouth daily.   BELSOMRA 20 MG TABS Take 1 tablet (20 mg total) by mouth at bedtime.   diazepam (VALIUM) 5 MG tablet Take by mouth.   esomeprazole (NEXIUM) 40 MG capsule Take 1 capsule (40 mg total) by mouth 2 (two) times daily.   hydrochlorothiazide (HYDRODIURIL) 25 MG tablet Take 1 tablet (25 mg total) by mouth daily.   loratadine (CLARITIN) 10 MG tablet Take 1 tablet (10 mg total) by mouth daily.   pregabalin (LYRICA) 75 MG capsule Take by mouth.   temazepam (RESTORIL) 30 MG capsule Take 1 capsule (30 mg total) by mouth at bedtime as needed.   [DISCONTINUED] mirtazapine (REMERON) 30 MG tablet Take 1 tablet (30 mg total) by mouth at bedtime.   [DISCONTINUED] mirtazapine (REMERON) 45 MG tablet Take 1 tablet (45 mg total) by mouth at bedtime.   mirtazapine (REMERON) 45 MG tablet Take 1 tablet (45 mg total) by mouth at bedtime.   No facility-administered encounter medications on file as of 07/31/2022.    Past Medical History:  Diagnosis Date   Allergy    Anemia    Diverticulosis    Gall bladder stones 1993   Gastric ulcer    GERD (gastroesophageal reflux disease)    Hypertension    Renal mass     Past Surgical History:  Procedure Laterality Date   ABDOMINAL HYSTERECTOMY     CHOLECYSTECTOMY      Family History  Problem Relation Age of Onset   Cancer Maternal Uncle        Lung   Cancer Maternal Uncle        Lung   Cancer Maternal Uncle        Lung   Headache Neg Hx         "I don't think so"   Migraines Neg Hx        "I don't think so"    Social History   Socioeconomic History   Marital status: Single    Spouse name: Not on file   Number of children: 3   Years of education: Not on file   Highest education level: High school graduate  Occupational History   Not on file  Tobacco Use   Smoking status: Every Day    Packs/day: 0.25    Years: 25.00    Additional pack years: 0.00    Total pack years: 6.25    Types: Cigarettes   Smokeless tobacco: Never   Tobacco comments:    Pt tried to quit - helps with anxiety     1 pack last patient 3 days   Vaping Use   Vaping Use: Never used  Substance and Sexual Activity   Alcohol use: Yes   Drug use: No   Sexual activity: Yes    Comment: hysterectomy  Other Topics Concern   Not on file  Social History Narrative   Lives at home in an apartment. Her mother lives with her.  Right handed   Caffeine: drinks approx. 36 oz of pepsi per day. Sometimes drinks 2 cups of coffee in a day as well.    Social Determinants of Health   Financial Resource Strain: High Risk (05/22/2022)   Overall Financial Resource Strain (CARDIA)    Difficulty of Paying Living Expenses: Very hard  Food Insecurity: No Food Insecurity (06/16/2022)   Hunger Vital Sign    Worried About Running Out of Food in the Last Year: Never true    Ran Out of Food in the Last Year: Never true  Transportation Needs: No Transportation Needs (05/22/2022)   PRAPARE - Administrator, Civil Service (Medical): No    Lack of Transportation (Non-Medical): No  Physical Activity: Sufficiently Active (05/22/2022)   Exercise Vital Sign    Days of Exercise per Week: 4 days    Minutes of Exercise per Session: 70 min  Stress: Stress Concern Present (05/22/2022)   [employer] of Occupational Health - Occupational Stress Questionnaire    Feeling of Stress : Very much  Social Connections: Moderately Isolated (05/22/2022)   Social Connection and  Isolation Panel [NHANES]    Frequency of Communication with Friends and Family: More than three times a week    Frequency of Social Gatherings with Friends and Family: Never    Attends Religious Services: Never    Database administrator or Organizations: Yes    Attends Banker Meetings: Never    Marital Status: Never married  Intimate Partner Violence: Not At Risk (05/22/2022)   Humiliation, Afraid, Rape, and Kick questionnaire    Fear of Current or Ex-Partner: No    Emotionally Abused: No    Physically Abused: No    Sexually Abused: No    Review of Systems  All other systems reviewed and are negative.       Objective    BP 110/73   Pulse 81   Temp (!) 95 F (35 C) (Oral)   Resp 16   Wt 171 lb 9.6 oz (77.8 kg)   SpO2 93%   BMI 30.40 kg/m   Physical Exam Vitals and nursing note reviewed.  Constitutional:      General: She is not in acute distress. Cardiovascular:     Rate and Rhythm: Normal rate and regular rhythm.  Pulmonary:     Effort: Pulmonary effort is normal.     Breath sounds: Normal breath sounds.  Abdominal:     Palpations: Abdomen is soft.     Tenderness: There is no abdominal tenderness.  Musculoskeletal:     Cervical back: Neck supple. Pain with movement present. Decreased range of motion.  Neurological:     General: No focal deficit present.     Mental Status: She is alert and oriented to person, place, and time.         Assessment & Plan:   1. Anxiety and depression Improved and appears stable. Continue. Meds refilled.   2. Chronic neck pain Management as per consultant. Keep scheduled appts with consultant.     No follow-ups on file.   Tommie Raymond, MD

## 2022-08-04 ENCOUNTER — Telehealth: Payer: Self-pay | Admitting: *Deleted

## 2022-08-05 ENCOUNTER — Telehealth: Payer: Self-pay | Admitting: *Deleted

## 2022-08-05 NOTE — Progress Notes (Signed)
  Care Coordination Note  08/05/2022 Name: Natalie Edwards MRN: [id number] DOB: Jul 19, 1965  Natalie Edwards is a 57 y.o. year old female who is a primary care patient of [name], MD and is actively engaged with the care management team. I reached out to [name] by phone today to assist with re-scheduling a follow up visit with the Licensed Clinical Social Worker  Follow up plan: Unsuccessful telephone outreach attempt made.   [facility]  Care Coordination Care Guide  Direct Dial: [phone]

## 2022-08-13 NOTE — Progress Notes (Signed)
  Care Coordination Note  08/13/2022 Name: Natalie Edwards MRN: [id number] DOB: 06-May-1965  Natalie Edwards is a 57 y.o. year old female who is a primary care patient of [name], MD and is actively engaged with the care management team. I reached out to [name] by phone today to assist with re-scheduling a follow up visit with the Licensed Clinical Social Worker  Follow up plan: Telephone appointment with care management team member scheduled for: 08/22/22  [facility]  Care Coordination Care Guide  Direct Dial: [phone]

## 2022-08-18 DIAGNOSIS — M4722 Other spondylosis with radiculopathy, cervical region: Secondary | ICD-10-CM | POA: Diagnosis not present

## 2022-08-18 DIAGNOSIS — M5440 Lumbago with sciatica, unspecified side: Secondary | ICD-10-CM | POA: Diagnosis not present

## 2022-08-19 ENCOUNTER — Telehealth: Payer: Self-pay | Admitting: *Deleted

## 2022-08-19 NOTE — Telephone Encounter (Signed)
Call place to patient with no answer.  Provider is OOF and we can not guarantee that she will get  Edwards letter for the extended time she is requesting   Copied from CRM [id number]. Topic: General - Other >> Aug 18, 2022  8:35 AM Natalie Edwards wrote: Reason for CRM: Pt states that she was seen in the office on 07/31/22 and was given Edwards note to return to work on 08/19/22. Pt states that she is not ready to go back to work yet and she is wanting Edwards note to extend her time out. Pt states that she will be ready to return back to work on 09/01/22. Pt states with the note it has to specifically have to state why she is out of work, pt is out of work due to depression. Please advise if pt is able to get another note and pt is wanting the new note to be faxed over to her employer.

## 2022-08-20 ENCOUNTER — Ambulatory Visit: Payer: Self-pay | Admitting: *Deleted

## 2022-08-20 ENCOUNTER — Ambulatory Visit: Payer: 59

## 2022-08-20 NOTE — Telephone Encounter (Signed)
  Chief Complaint: Depression Symptoms: States no worsening, "Just feeling the same,sad."     "Jittery" at times Frequency:  Pertinent Negatives: Patient denies suicidal ideation. Disposition: [] ED /[] Urgent Care (no appt availability in office) / [x] Appointment(In office/virtual)/ []  [place] [facility]/ [] Home Care/ [] Refused Recommended Disposition /[] [place] [facility]/ []  Follow-up with PCP Additional Notes:  Pt initially calling to request FMLA extension. States still feeling depressed, no worse. States has appt with therapist Thursday.  Requesting FMLA extension until appt date. Pt was scheduled for 09/16/22, secured earlier appt for 09/01/22. Care advise provided, pt verbalizes understanding.  If appropriate please FAX: [facility]    #[phone].  States must note reason as "Depression."  Reason for Disposition  [1] Started on anti-depressant medications < 2 weeks ago AND [2] not feeling any better  Answer Assessment - Initial Assessment Questions 1. CONCERN: "What happened that made you call today?"      Nothing 2. DEPRESSION SYMPTOM SCREENING: "How are you feeling overall?" (e.g., decreased energy, increased sleeping or difficulty sleeping, difficulty concentrating, feelings of sadness, guilt, hopelessness, or worthlessness)     Decreased energy,sleep,feeling of sadness 3. RISK OF HARM - SUICIDAL IDEATION:  "Do you ever have thoughts of hurting or killing yourself?"  (e.g., yes, no, no but preoccupation with thoughts about death)   - INTENT:  "Do you have thoughts of hurting or killing yourself right NOW?" (e.g., yes, no, N/A) No   - PLAN: "Do you have a specific plan for how you would do this?" (e.g., gun, knife, overdose, no plan, N/A)     No 4. RISK OF HARM - HOMICIDAL IDEATION:  "Do you ever have thoughts of hurting or killing someone else?"  (e.g., yes, no, no but preoccupation with thoughts about death)   - INTENT:  "Do you have thoughts of hurting or killing  someone right NOW?" (e.g., yes, no, N/A)   - PLAN: "Do you have a specific plan for how you would do this?" (e.g., gun, knife, no plan, N/A)      *No Answer* 5. FUNCTIONAL IMPAIRMENT: "How have things been going for you overall? Have you had more difficulty than usual doing your normal daily activities?"  (e.g., better, same, worse; self-care, school, work, interactions)     *No Answer* 6. SUPPORT: "Who is with you now?" "Who do you live with?" "Do you have family or friends who you can talk to?"      *No Answer* 7. THERAPIST: "Do you have a counselor or therapist? Name?"     Yes 8. STRESSORS: "Has there been any new stress or recent changes in your life?"     No 9. ALCOHOL USE OR SUBSTANCE USE (DRUG USE): "Do you drink alcohol or use any illegal drugs?"     *No Answer* 10. OTHER: "Do you have any other physical symptoms right now?" (e.g., fever)  Protocols used: Depression-A-AH

## 2022-08-21 ENCOUNTER — Ambulatory Visit: Payer: Self-pay | Admitting: *Deleted

## 2022-08-21 NOTE — Patient Instructions (Signed)
Visit Information  Thank you for taking time to visit with me today. Please don't hesitate to contact me if I can be of assistance to you.   Following are the goals we discussed today:   Goals Addressed             This Visit's Progress    Reduce stress related to financial strains       Activities and task to complete in order to accomplish goals.   Find ways to remember your mom on her birthday on 09/06/22 (examples: do something she enjoyed doing or eat her favorite food) Consider writing a letter to your mom and taking it with you to the gravesite Review material emailed to you on grief counseling Follow up with housing resources discussed (call to followup on all possible leads and check back in for updates periodically) Follow up on food resources discussed (food pantry, etc ) Pursue counseling- complete paperwork being emailed to you from [facility] to schedule your counseling appointment. Keep all upcoming appointments   Continue with compliance of taking medication prescribed by Doctor Self Support options  (continue to seek assistance from [facility], community resource/agency, etc as discussed )  Consider alternative housing options as you mentioned Seek alternative work opportunities at current employer as well as other- [employer], [employer],etc        Our next appointment is by telephone on 09/04/22  Please call the care guide team at [phone] if you need to cancel or reschedule your appointment.   If you are experiencing a Mental Health or Behavioral Health Crisis or need someone to talk to, please call the Suicide and Crisis Lifeline: 988 call 911   The patient verbalized understanding of instructions, educational materials, and care plan provided today and DECLINED offer to receive copy of patient instructions, educational materials, and care plan.   Telephone follow up appointment with care management team member scheduled for:09/04/22  Reece Levy, MSW, LCSW Clinical Social Worker [employer] [phone]

## 2022-08-21 NOTE — Patient Outreach (Signed)
  Care Coordination   Follow Up Visit Note   08/21/2022 Name: Natalie Edwards MRN: [id number] DOB: 1965-12-02  Natalie Edwards is a 57 y.o. year old female who sees [name], MD for primary care. I spoke with  [name] by phone today.  What matters to the patients health and wellness today?  Pt has not completed paperwork for counseling appointment at [facility]. Stressed the importance of this.    Goals Addressed             This Visit's Progress    Reduce stress related to financial strains       Activities and task to complete in order to accomplish goals.   Find ways to remember your mom on her birthday on 09/06/22 (examples: do something she enjoyed doing or eat her favorite food) Consider writing a letter to your mom and taking it with you to the gravesite Review material emailed to you on grief counseling Follow up with housing resources discussed (call to followup on all possible leads and check back in for updates periodically) Follow up on food resources discussed (food pantry, etc ) Pursue counseling- complete paperwork being emailed to you from [facility] to schedule your counseling appointment. Keep all upcoming appointments   Continue with compliance of taking medication prescribed by [facility] Support options  (continue to seek assistance from [employer], community resource/agency, etc as discussed )  Consider alternative housing options as you mentioned Seek alternative work opportunities at current employer as well as other- [employer], [employer]        SDOH assessments and interventions completed:  Yes  SDOH Interventions Today    Flowsheet Row Most Recent Value  SDOH Interventions   Depression Interventions/Treatment  Referral to Psychiatry, Medication, Counseling        Care Coordination Interventions:  Yes, provided  Interventions Today    Flowsheet Row Most Recent Value  Chronic Disease   Chronic disease during today's visit Other   [depression]  General Interventions   General Interventions Discussed/Reviewed [employer] resources again with pt for grief support, counseling,etc and emailed info to her]  Mental Health Interventions   Mental Health Discussed/Reviewed Coping Strategies, Depression, Mental Health Discussed, Mental Health Reviewed  [Pt reports continued depression with her mother's birthday coming up 09/06/22. Plans to visit her gravesite. reminded pt about [facility]osp[facility] grief counseling/support. PHQ score today remains moderately high- pt denies SI/HI.]  Safety Interventions   Safety Discussed/Reviewed Safety Discussed, Safety Reviewed       Follow up plan: Follow up call scheduled for 09/04/22    Encounter Outcome:  Pt. Visit Completed

## 2022-08-22 ENCOUNTER — Encounter: Payer: 59 | Admitting: *Deleted

## 2022-08-27 ENCOUNTER — Other Ambulatory Visit: Payer: Self-pay

## 2022-08-27 NOTE — Progress Notes (Signed)
This patient is appearing on the insurance-provided list for being at risk of failing the adherence measure for hypertension (ACEi/ARB) medications this calendar year.   Medication: amlodipine-benazepril Last fill date: 07/01/2022 for 30 day supply  Outreached patient to discuss adherence, any barriers to adherence, and consider a referral to me for medication management support.   Patient request a 90 day supply of all medications be sent to mail order, [facility]. Will send message to PCP requesting this. Also requested patient remind PCP at appointment on 09/01/2022.   Valeda Malm, Pharm.D. PGY-2 Ambulatory Care Pharmacy Resident

## 2022-08-28 ENCOUNTER — Ambulatory Visit
Admission: EM | Admit: 2022-08-28 | Discharge: 2022-08-28 | Disposition: A | Discharge status: Home / Self Care | Payer: 59 | Attending: Family Medicine | Admitting: Family Medicine

## 2022-08-28 ENCOUNTER — Other Ambulatory Visit: Payer: Self-pay | Admitting: Family Medicine

## 2022-08-28 ENCOUNTER — Other Ambulatory Visit: Payer: Self-pay

## 2022-08-28 ENCOUNTER — Encounter: Payer: Self-pay | Admitting: *Deleted

## 2022-08-28 ENCOUNTER — Telehealth: Payer: Self-pay | Admitting: Pulmonary Disease

## 2022-08-28 DIAGNOSIS — J069 Acute upper respiratory infection, unspecified: Secondary | ICD-10-CM | POA: Insufficient documentation

## 2022-08-28 DIAGNOSIS — N189 Chronic kidney disease, unspecified: Secondary | ICD-10-CM | POA: Insufficient documentation

## 2022-08-28 DIAGNOSIS — B9789 Other viral agents as the cause of diseases classified elsewhere: Secondary | ICD-10-CM | POA: Insufficient documentation

## 2022-08-28 DIAGNOSIS — I129 Hypertensive chronic kidney disease with stage 1 through stage 4 chronic kidney disease, or unspecified chronic kidney disease: Secondary | ICD-10-CM | POA: Insufficient documentation

## 2022-08-28 DIAGNOSIS — R110 Nausea: Secondary | ICD-10-CM | POA: Insufficient documentation

## 2022-08-28 DIAGNOSIS — Z8616 Personal history of COVID-19: Secondary | ICD-10-CM | POA: Insufficient documentation

## 2022-08-28 DIAGNOSIS — I10 Essential (primary) hypertension: Secondary | ICD-10-CM

## 2022-08-28 LAB — SARS CORONAVIRUS 2 (TAT 6-24 HRS): SARS Coronavirus 2: NEGATIVE

## 2022-08-28 MED ORDER — ONDANSETRON 4 MG PO TBDP: 4.0000 mg | ORAL_TABLET | Freq: Three times a day (TID) | ORAL | 0 refills | Status: DC | PRN

## 2022-08-28 MED ORDER — TRAMADOL HCL 50 MG PO TABS: 50.0000 mg | ORAL_TABLET | Freq: Four times a day (QID) | ORAL | 0 refills | Status: DC | PRN

## 2022-08-28 MED ORDER — AMLODIPINE BESY-BENAZEPRIL HCL 10-40 MG PO CAPS
1.0000 | ORAL_CAPSULE | Freq: Every day | ORAL | 0 refills | Status: DC
Start: 2022-08-28 — End: 2022-09-30

## 2022-08-28 MED ORDER — HYDROCHLOROTHIAZIDE 25 MG PO TABS
25.0000 mg | ORAL_TABLET | Freq: Every day | ORAL | 0 refills | Status: DC
Start: 2022-08-28 — End: 2022-09-30

## 2022-08-28 NOTE — Telephone Encounter (Signed)
Requested Prescriptions  Pending Prescriptions Disp Refills   mirtazapine (REMERON) 45 MG tablet 90 tablet 1    Sig: Take 1 tablet (45 mg total) by mouth at bedtime.     Psychiatry: Antidepressants - mirtazapine Passed - 08/28/2022 12:03 PM      Passed - Completed PHQ-2 or PHQ-9 in the last 360 days      Passed - Valid encounter within last 6 months    Recent Outpatient Visits           4 weeks ago Anxiety and depression   [place] [facility], MD   3 months ago Encounter for Medicare annual wellness exam   [place] [facility], MD   4 months ago Anxiety and depression   [place] [facility] at [facility], MD   5 months ago Anxiety and depression   [place] [facility] at [facility], MD   8 months ago Primary hypertension   [place] [facility] at [facility] [place], NP       Future Appointments             In 4 days [name], MD [facility] at [facility]             amLODipine-benazepril (LOTREL) 10-40 MG capsule 30 capsule 2    Sig: Take 1 capsule by mouth daily.     Cardiovascular: CCB + ACEI Combos Failed - 08/28/2022 12:03 PM      Failed - Cr in normal range and within 180 days    Creatinine  Date Value Ref Range Status  03/08/2019 1.37 (H) 0.44 - 1.00 mg/dL Final   Creatinine, Ser  Date Value Ref Range Status  05/12/2022 1.18 (H) 0.57 - 1.00 mg/dL Final         Failed - K in normal range and within 180 days    Potassium  Date Value Ref Range Status  05/12/2022 5.3 (H) 3.5 - 5.2 mmol/L Final         Passed - Na in normal range and within 180 days    Sodium  Date Value Ref Range Status  05/12/2022 140 134 - 144 mmol/L Final         Passed - eGFR is 30 or above and within 180 days    GFR, Est AFR Am  Date Value Ref Range Status  03/08/2019 51 (L) >60 mL/min Final   GFR, Estimated  Date Value  Ref Range Status  11/29/2021 >60 >60 mL/min Final    Comment:    (NOTE) Calculated using the CKD-EPI Creatinine Equation (2021)   03/08/2019 44 (L) >60 mL/min Final   GFR  Date Value Ref Range Status  05/03/2019 53.93 (L) >60.00 mL/min Final   eGFR  Date Value Ref Range Status  05/12/2022 54 (L) >59 mL/min/1.73 Final         Passed - Patient is not pregnant      Passed - Last BP in normal range    BP Readings from Last 1 Encounters:  08/28/22 103/73         Passed - Valid encounter within last 6 months    Recent Outpatient Visits           4 weeks ago Anxiety and depression   [place] [facility], MD   3 months ago Encounter for [facility]  annual wellness exam   [place] [facility] at [facility], MD   4 months ago Anxiety and depression   [place] [facility] at [facility], MD   5 months ago Anxiety and depression   [place] [facility], MD   8 months ago Primary hypertension   [place] [facility] at [facility] [place], NP       Future Appointments             In 4 days [name], MD [facility] at [facility]             hydrochlorothiazide (HYDRODIURIL) 25 MG tablet 30 tablet 2    Sig: Take 1 tablet (25 mg total) by mouth daily.     Cardiovascular: Diuretics - Thiazide Failed - 08/28/2022 12:03 PM      Failed - Cr in normal range and within 180 days    Creatinine  Date Value Ref Range Status  03/08/2019 1.37 (H) 0.44 - 1.00 mg/dL Final   Creatinine, Ser  Date Value Ref Range Status  05/12/2022 1.18 (H) 0.57 - 1.00 mg/dL Final         Failed - K in normal range and within 180 days    Potassium  Date Value Ref Range Status  05/12/2022 5.3 (H) 3.5 - 5.2 mmol/L Final         Passed - Na in normal range and within 180 days    Sodium  Date Value Ref Range Status  05/12/2022 140 134 - 144  mmol/L Final         Passed - Last BP in normal range    BP Readings from Last 1 Encounters:  08/28/22 103/73         Passed - Valid encounter within last 6 months    Recent Outpatient Visits           4 weeks ago Anxiety and depression   [place] [facility], MD   3 months ago Encounter for Medicare annual wellness exam   [place] [facility], MD   4 months ago Anxiety and depression   [place] [facility] at [facility], MD   5 months ago Anxiety and depression   [place] [facility] at [facility], MD   8 months ago Primary hypertension    Primary Care at [facility], [place], [place]       Future Appointments             In 4 days [name], MD [facility] at [facility]

## 2022-08-28 NOTE — Discharge Instructions (Signed)
Ondansetron dissolved in the mouth every 8 hours as needed for nausea or vomiting. Clear liquids(water, gatorade/pedialyte, ginger ale/sprite, chicken broth/soup) and bland things(crackers/toast, rice, potato, bananas) to eat. Avoid acidic foods like lemon/lime/orange/tomato, and avoid greasy/spicy foods.  Take tramadol 50 mg-- 1 tablet every 6 hours as needed for pain.  This medication can make you sleepy or dizzy   You have been swabbed for COVID, and the test will result in the next 24 hours. Our staff will call you if positive. If the COVID test is positive, you should quarantine until you are fever free for 24 hours and you are starting to feel better, and then take added precautions for the next 5 days, such as physical distancing/wearing a mask and good hand hygiene/washing.

## 2022-08-28 NOTE — Telephone Encounter (Signed)
Medication Refill - Medication: mirtazapine (REMERON) 45 MG tablet , amLODipine-benazepril (LOTREL) 10-40 MG capsule , hydrochlorothiazide (HYDRODIURIL) 25 MG tablet   Has the patient contacted their pharmacy? Yes.   Pharmacy calling.   (Agent: If yes, when and what did the pharmacy advise?)  Preferred Pharmacy (with phone number or street name):  [facility] - [place] - [address]  [address] [place] [address]  Phone: [phone] Fax: [phone]  Hours: Not open 24 hours   Has the patient been seen for an appointment in the last year OR does the patient have an upcoming appointment? Yes.    Agent: Please be advised that RX refills may take up to 3 business days. We ask that you follow-up with your pharmacy.

## 2022-08-28 NOTE — ED Provider Notes (Signed)
[facility]    CSN: [id number] Arrival date & time: 08/28/22  1057      History   Chief Complaint Chief Complaint  Patient presents with   Nausea   Headache    HPI Natalie Edwards is a 57 y.o. female.    Headache  Here for headache, nausea, cough, and congestion.  Symptoms began on the evening of June 10.  She has not had any vomiting and no diarrhea.  No sore throat.  No fever or chills.  She has had some myalgia and some malaise.  No dysuria and no hematuria.  She has had COVID and pneumonia in mid February of this year.  She had trouble tolerating the Paxlovid which caused jitteriness  He does have a history of hypertension and she has had a hysterectomy. Past Medical History:  Diagnosis Date   Allergy    Anemia    Diverticulosis    Gall bladder stones 1993   Gastric ulcer    GERD (gastroesophageal reflux disease)    Hypertension    Renal mass     Patient Active Problem List   Diagnosis Date Noted   Hyperlipidemia 12/14/2021   Prediabetes 12/14/2021   Anxiety and depression 05/14/2021   Breathing difficult 05/14/2021   Upper respiratory tract infection 05/14/2021   GERD (gastroesophageal reflux disease)    Visit for routine gyn exam 01/14/2021   Screening mammogram, encounter for 01/14/2021   Possible exposure to STD 01/14/2021   Chronic insomnia 10/18/2019   CKD (chronic kidney disease) 04/07/2019   GAD (generalized anxiety disorder) 05/07/2018   Chronic gastric ulcer 05/07/2018   Essential hypertension 05/07/2018   Iron deficiency anemia due to chronic blood loss 09/23/2016   Peptic ulcer disease with hemorrhage 09/23/2016   Thrombocytosis 09/09/2016   Leukocytosis 09/09/2016   H/O ulcer disease 07/31/2016    Past Surgical History:  Procedure Laterality Date   ABDOMINAL HYSTERECTOMY     CHOLECYSTECTOMY      OB History     Gravida  7   Para  3   Term  3   Preterm      AB  4   Living         SAB      IAB  4    Ectopic      Multiple      Live Births               Home Medications    Prior to Admission medications   Medication Sig Start Date End Date Taking? Authorizing Provider  BELSOMRA 20 MG TABS Take 1 tablet (20 mg total) by mouth at bedtime. 07/22/22  Yes Olalere, Adewale A, MD  esomeprazole (NEXIUM) 40 MG capsule Take 1 capsule (40 mg total) by mouth 2 (two) times daily. 06/09/22  Yes Tressia Danas, MD  loratadine (CLARITIN) 10 MG tablet Take 1 tablet (10 mg total) by mouth daily. 08/18/19  Yes Arvilla Market, MD  mirtazapine (REMERON) 45 MG tablet Take 1 tablet (45 mg total) by mouth at bedtime. 07/31/22  Yes [name], MD  ondansetron (ZOFRAN-ODT) 4 MG disintegrating tablet Take 1 tablet (4 mg total) by mouth every 8 (eight) hours as needed for nausea or vomiting. 08/28/22  Yes Zenia Resides, MD  pregabalin (LYRICA) 75 MG capsule Take by mouth. 06/23/22  Yes [provider]  temazepam (RESTORIL) 30 MG capsule Take 1 capsule (30 mg total) by mouth at bedtime as needed. 07/16/22  Yes Olalere, Adewale  A, MD  traMADol (ULTRAM) 50 MG tablet Take 1 tablet (50 mg total) by mouth every 6 (six) hours as needed (pain). 08/28/22  Yes Chun Sellen, Janace Aris, MD  amLODipine-benazepril (LOTREL) 10-40 MG capsule Take 1 capsule by mouth daily. 05/22/22 08/20/22  [name], MD  diazepam (VALIUM) 5 MG tablet Take by mouth. 06/23/22   [provider]  hydrochlorothiazide (HYDRODIURIL) 25 MG tablet Take 1 tablet (25 mg total) by mouth daily. 05/22/22 08/20/22  [name], MD    Family History Family History  Problem Relation Age of Onset   Cancer Maternal Uncle        Lung   Cancer Maternal Uncle        Lung   Cancer Maternal Uncle        Lung   Headache Neg Hx        "I don't think so"   Migraines Neg Hx        "I don't think so"    Social History Social History   Tobacco Use   Smoking status: Every Day    Packs/day: 0.25    Years: 25.00    Additional pack  years: 0.00    Total pack years: 6.25    Types: Cigarettes   Smokeless tobacco: Never   Tobacco comments:    Pt tried to quit - helps with anxiety     1 pack last patient 3 days   Vaping Use   Vaping Use: Never used  Substance Use Topics   Alcohol use: Yes   Drug use: No     Allergies   Hydroxyzine and Ambien [zolpidem tartrate]   Review of Systems Review of Systems  Neurological:  Positive for headaches.     Physical Exam Triage Vital Signs ED Triage Vitals  Enc Vitals Group     BP 08/28/22 1122 103/73     Pulse Rate 08/28/22 1122 91     Resp 08/28/22 1122 18     Temp 08/28/22 1122 98 F (36.7 C)     Temp src --      SpO2 08/28/22 1122 95 %     Weight --      Height --      Head Circumference --      Peak Flow --      Pain Score 08/28/22 1118 10     Pain Loc --      Pain Edu? --      Excl. in GC? --    No data found.  Updated Vital Signs BP 103/73   Pulse 91   Temp 98 F (36.7 C)   Resp 18   SpO2 95%   Visual Acuity Right Eye Distance:   Left Eye Distance:   Bilateral Distance:    Right Eye Near:   Left Eye Near:    Bilateral Near:     Physical Exam Vitals reviewed.  Constitutional:      General: She is not in acute distress.    Appearance: She is not toxic-appearing.  HENT:     Right Ear: Tympanic membrane and ear canal normal.     Left Ear: Tympanic membrane and ear canal normal.     Nose: Nose normal.     Mouth/Throat:     Mouth: Mucous membranes are moist.     Pharynx: No oropharyngeal exudate or posterior oropharyngeal erythema.  Eyes:     Extraocular Movements: Extraocular movements intact.     Conjunctiva/sclera: Conjunctivae normal.  Pupils: Pupils are equal, round, and reactive to light.  Cardiovascular:     Rate and Rhythm: Normal rate and regular rhythm.     Heart sounds: No murmur heard. Pulmonary:     Effort: Pulmonary effort is normal. No respiratory distress.     Breath sounds: No stridor. No wheezing, rhonchi or  rales.  Abdominal:     Palpations: Abdomen is soft.     Tenderness: There is no abdominal tenderness.  Musculoskeletal:     Cervical back: Neck supple.  Lymphadenopathy:     Cervical: No cervical adenopathy.  Skin:    Capillary Refill: Capillary refill takes less than 2 seconds.     Coloration: Skin is not jaundiced or pale.  Neurological:     General: No focal deficit present.     Mental Status: She is alert and oriented to person, place, and time.  Psychiatric:        Behavior: Behavior normal.      UC Treatments / Results  Labs (all labs ordered are listed, but only abnormal results are displayed) Labs Reviewed  SARS CORONAVIRUS 2 (TAT 6-24 HRS)    EKG   Radiology No results found.  Procedures Procedures (including critical care time)  Medications Ordered in UC Medications - No data to display  Initial Impression / Assessment and Plan / UC Course  I have reviewed the triage vital signs and the nursing notes.  Pertinent labs & imaging results that were available during my care of the patient were reviewed by me and considered in my medical decision making (see chart for details).        Vital signs are stable and reassuring.  Her EGFR is reduced and was 54 when last done.  Tramadol is sent in for the pain, and Zofran as needed for nausea.  COVID swab is done.  If it is positive she should be offered Paxlovid with renal dosing.  She may not want to take it since she had trouble taking it previously Final Clinical Impressions(s) / UC Diagnoses   Final diagnoses:  Viral URI   Discharge Instructions   None    ED Prescriptions     Medication Sig Dispense Auth. Provider   ondansetron (ZOFRAN-ODT) 4 MG disintegrating tablet Take 1 tablet (4 mg total) by mouth every 8 (eight) hours as needed for nausea or vomiting. 10 tablet Zenia Resides, MD   traMADol (ULTRAM) 50 MG tablet Take 1 tablet (50 mg total) by mouth every 6 (six) hours as needed (pain). 12  tablet Hjalmer Iovino, Janace Aris, MD      I have reviewed the PDMP during this encounter.   Zenia Resides, MD 08/28/22 1143

## 2022-08-28 NOTE — Telephone Encounter (Signed)
Patient would like to speak with the nurse regarding her medications that were prescribed by the doctor for sleeping.  She stated that the pharmacy does not want to fill both her medications and she does not know why.  Please call to discuss further.  CB# [phone]

## 2022-08-28 NOTE — ED Triage Notes (Signed)
Pt reports having a HA for past 3 days  and  nausea for past 2 days.

## 2022-08-30 ENCOUNTER — Emergency Department (HOSPITAL_COMMUNITY): Payer: 59

## 2022-08-30 ENCOUNTER — Emergency Department (HOSPITAL_COMMUNITY)
Admission: EM | Admit: 2022-08-30 | Discharge: 2022-08-30 | Disposition: A | Discharge status: Home / Self Care | Payer: 59 | Attending: Emergency Medicine | Admitting: Emergency Medicine

## 2022-08-30 DIAGNOSIS — D72829 Elevated white blood cell count, unspecified: Secondary | ICD-10-CM | POA: Diagnosis not present

## 2022-08-30 DIAGNOSIS — Z79899 Other long term (current) drug therapy: Secondary | ICD-10-CM | POA: Insufficient documentation

## 2022-08-30 DIAGNOSIS — R110 Nausea: Secondary | ICD-10-CM

## 2022-08-30 DIAGNOSIS — Z743 Need for continuous supervision: Secondary | ICD-10-CM | POA: Diagnosis not present

## 2022-08-30 DIAGNOSIS — R059 Cough, unspecified: Secondary | ICD-10-CM | POA: Diagnosis not present

## 2022-08-30 DIAGNOSIS — G4489 Other headache syndrome: Secondary | ICD-10-CM | POA: Diagnosis not present

## 2022-08-30 DIAGNOSIS — I10 Essential (primary) hypertension: Secondary | ICD-10-CM | POA: Insufficient documentation

## 2022-08-30 DIAGNOSIS — R519 Headache, unspecified: Secondary | ICD-10-CM

## 2022-08-30 DIAGNOSIS — E871 Hypo-osmolality and hyponatremia: Secondary | ICD-10-CM | POA: Diagnosis not present

## 2022-08-30 LAB — BASIC METABOLIC PANEL
Anion gap: 8 (ref 5–15)
BUN: 18 mg/dL (ref 6–20)
CO2: 21 mmol/L — ABNORMAL LOW (ref 22–32)
Calcium: 8.9 mg/dL (ref 8.9–10.3)
Chloride: 105 mmol/L (ref 98–111)
Creatinine, Ser: 1.04 mg/dL — ABNORMAL HIGH (ref 0.44–1.00)
GFR, Estimated: >60 mL/min (ref >60)
Glucose, Bld: 85 mg/dL (ref 70–99)
Potassium: 3.6 mmol/L (ref 3.5–5.1)
Sodium: 134 mmol/L — ABNORMAL LOW (ref 135–145)

## 2022-08-30 LAB — CBC WITH DIFFERENTIAL/PLATELET
Abs Immature Granulocytes: 0.04 10*3/uL (ref 0.00–0.07)
Basophils Absolute: 0 10*3/uL (ref 0.0–0.1)
Basophils Relative: 0 %
Eosinophils Absolute: 0.1 10*3/uL (ref 0.0–0.5)
Eosinophils Relative: 1 %
HCT: 34.5 % — ABNORMAL LOW (ref 36.0–46.0)
Hemoglobin: 11.2 g/dL — ABNORMAL LOW (ref 12.0–15.0)
Immature Granulocytes: 0 %
Lymphocytes Relative: 26 %
Lymphs Abs: 3.4 10*3/uL (ref 0.7–4.0)
MCH: 28.1 pg (ref 26.0–34.0)
MCHC: 32.5 g/dL (ref 30.0–36.0)
MCV: 86.7 fL (ref 80.0–100.0)
Monocytes Absolute: 1.1 10*3/uL — ABNORMAL HIGH (ref 0.1–1.0)
Monocytes Relative: 8 %
Neutro Abs: 8.3 10*3/uL — ABNORMAL HIGH (ref 1.7–7.7)
Neutrophils Relative %: 65 %
Platelets: 606 10*3/uL — ABNORMAL HIGH (ref 150–400)
RBC: 3.98 MIL/uL (ref 3.87–5.11)
RDW: 15.9 % — ABNORMAL HIGH (ref 11.5–15.5)
WBC: 12.9 10*3/uL — ABNORMAL HIGH (ref 4.0–10.5)
nRBC: 0 % (ref 0.0–0.2)

## 2022-08-30 MED ORDER — METOCLOPRAMIDE HCL 5 MG/ML IJ SOLN
5.0000 mg | Freq: Once | INTRAMUSCULAR | Status: AC
  Administered 2022-08-30: 5 mg via INTRAVENOUS
  Filled 2022-08-30: qty 2

## 2022-08-30 MED ORDER — SODIUM CHLORIDE 0.9 % IV BOLUS
1000.0000 mL | Freq: Once | INTRAVENOUS | Status: AC
  Administered 2022-08-30: 1000 mL via INTRAVENOUS

## 2022-08-30 MED ORDER — DIPHENHYDRAMINE HCL 50 MG/ML IJ SOLN
12.5000 mg | Freq: Once | INTRAMUSCULAR | Status: AC
  Administered 2022-08-30: 12.5 mg via INTRAVENOUS
  Filled 2022-08-30: qty 1

## 2022-08-30 MED ORDER — METOCLOPRAMIDE HCL 10 MG PO TABS: 10.0000 mg | ORAL_TABLET | Freq: Four times a day (QID) | ORAL | 0 refills | Status: DC | PRN

## 2022-08-30 MED ORDER — KETOROLAC TROMETHAMINE 30 MG/ML IJ SOLN
30.0000 mg | Freq: Once | INTRAMUSCULAR | Status: AC
  Administered 2022-08-30: 30 mg via INTRAVENOUS
  Filled 2022-08-30: qty 1

## 2022-08-30 NOTE — Discharge Instructions (Addendum)
You have been seen today for your complaint of headache, nausea. Your lab work was reassuring. Your imaging was reassuring. Your discharge medications include your home medicines.  Take them as prescribed. Home care instructions are as follows:  Drink plenty of water Follow up with: Your primary care provider in 1 week for reevaluation of your symptoms Please seek immediate medical care if you develop any of the following symptoms: Your headache: Gets very bad quickly. Gets worse after a lot of physical activity. You have any of these symptoms: You continue to vomit. A stiff neck. Trouble seeing. Your eye or ear hurts. Trouble speaking. Weak muscles or you lose muscle control. You lose your balance or have trouble walking. You feel like you will pass out (faint) or you pass out. You are mixed up (confused). You have a seizure. At this time there does not appear to be the presence of an emergent medical condition, however there is always the potential for conditions to change. Please read and follow the below instructions.  Do not take your medicine if  develop an itchy rash, swelling in your mouth or lips, or difficulty breathing; call 911 and seek immediate emergency medical attention if this occurs.  You may review your lab tests and imaging results in their entirety on your MyChart account.  Please discuss all results of fully with your primary care provider and other specialist at your follow-up visit.  Note: Portions of this text may have been transcribed using voice recognition software. Every effort was made to ensure accuracy; however, inadvertent computerized transcription errors may still be present.

## 2022-08-30 NOTE — ED Provider Notes (Signed)
[place] [facility] AT [facility] Provider Note   CSN: [id number] Arrival date & time: 08/30/22  2112     History  Chief Complaint  Patient presents with   Headache    Pt BIB EMS, G. [facility], with pt c/o HA x 5 days , N/V x5 days, hx of HTN. EMS gave 500 ml of NS, and 4mg  of Zofran at 2055, pt with IV in place 18, R. AC     Natalie Edwards is a 57 y.o. female.  With a history of hypertension, anemia who presents to the ED for evaluation of nausea and headache.  She states this began 5 days ago.  It was gradual in onset and has progressively gotten worse.  She presented to urgent care and was prescribed tramadol and Zofran.  States these have not been helping her headache.  Headache is localized in a headband distribution across the forehead.  Described as a squeezing sensation.  She denies any vomiting.  No fevers or neck stiffness.  No vision changes, numbness, weakness, tingling.  No chest pain or shortness of breath.  She does report a dry cough.   Headache Associated symptoms: cough and nausea        Home Medications Prior to Admission medications   Medication Sig Start Date End Date Taking? Authorizing Provider  metoCLOPramide (REGLAN) 10 MG tablet Take 1 tablet (10 mg total) by mouth every 6 (six) hours as needed for nausea. 08/30/22  Yes Gregrey Bloyd, Edsel Petrin, PA-C  ondansetron (ZOFRAN-ODT) 4 MG disintegrating tablet Take 1 tablet (4 mg total) by mouth every 8 (eight) hours as needed for nausea or vomiting. 08/28/22  Yes Banister, Janace Aris, MD  amLODipine-benazepril (LOTREL) 10-40 MG capsule Take 1 capsule by mouth daily. 08/28/22 11/26/22  [name], MD  BELSOMRA 20 MG TABS Take 1 tablet (20 mg total) by mouth at bedtime. 07/22/22   Olalere, Onnie Boer A, MD  diazepam (VALIUM) 5 MG tablet Take by mouth. 06/23/22   [provider]  esomeprazole (NEXIUM) 40 MG capsule Take 1 capsule (40 mg total) by mouth 2 (two) times daily. 06/09/22   Tressia Danas, MD   hydrochlorothiazide (HYDRODIURIL) 25 MG tablet Take 1 tablet (25 mg total) by mouth daily. 08/28/22 11/26/22  [name], MD  loratadine (CLARITIN) 10 MG tablet Take 1 tablet (10 mg total) by mouth daily. 08/18/19   Arvilla Market, MD  mirtazapine (REMERON) 45 MG tablet Take 1 tablet (45 mg total) by mouth at bedtime. 07/31/22   [name], MD  pregabalin (LYRICA) 75 MG capsule Take by mouth. 06/23/22   [provider]  temazepam (RESTORIL) 30 MG capsule Take 1 capsule (30 mg total) by mouth at bedtime as needed. 07/16/22   Olalere, Minna Antis, MD  traMADol (ULTRAM) 50 MG tablet Take 1 tablet (50 mg total) by mouth every 6 (six) hours as needed (pain). 08/28/22   Zenia Resides, MD      Allergies    Hydroxyzine and Ambien [zolpidem tartrate]    Review of Systems   Review of Systems  Respiratory:  Positive for cough.   Gastrointestinal:  Positive for nausea.  Neurological:  Positive for headaches.  All other systems reviewed and are negative.   Physical Exam Updated Vital Signs BP 112/70 (BP Location: Right Arm)   Pulse 79   Temp 98.1 F (36.7 C) (Oral)   Resp 19   Ht 5\' 3"  (1.6 m)   Wt 79.8 kg   SpO2 95%  BMI 31.18 kg/m  Physical Exam Vitals and nursing note reviewed.  Constitutional:      General: She is not in acute distress.    Appearance: She is well-developed.  HENT:     Head: Normocephalic and atraumatic.  Eyes:     Conjunctiva/sclera: Conjunctivae normal.  Cardiovascular:     Rate and Rhythm: Normal rate and regular rhythm.     Heart sounds: No murmur heard. Pulmonary:     Effort: Pulmonary effort is normal. No respiratory distress.     Breath sounds: Normal breath sounds.  Abdominal:     Palpations: Abdomen is soft.     Tenderness: There is no abdominal tenderness.  Musculoskeletal:        General: No swelling.     Cervical back: Normal range of motion and neck supple.  Skin:    General: Skin is warm and dry.     Capillary Refill:  Capillary refill takes less than 2 seconds.  Neurological:     Mental Status: She is alert and oriented to person, place, and time.     GCS: GCS eye subscore is 4. GCS verbal subscore is 5. GCS motor subscore is 6.     Comments:   MENTAL STATUS: AAOx3   LANG/SPEECH: Fluent, intact naming, repetition & comprehension   CRANIAL NERVES:   II: Pupils equal and reactive   III, IV, VI: EOM intact, no gaze preference or deviation, no nystagmus   V: normal sensation of the face   VII: no facial asymmetry   VIII: normal hearing to speech   MOTOR: 5/5 in both upper and lower extremities   SENSORY: Normal to touch in all extremiteis   COORD: Normal finger to nose, heel to shin and shoulder shrug, no tremor, no dysmetria. No pronator drift   Psychiatric:        Mood and Affect: Mood normal.     ED Results / Procedures / Treatments   Labs (all labs ordered are listed, but only abnormal results are displayed) Labs Reviewed  BASIC METABOLIC PANEL - Abnormal; Notable for the following components:      Result Value   Sodium 134 (*)    CO2 21 (*)    Creatinine, Ser 1.04 (*)    All other components within normal limits  CBC WITH DIFFERENTIAL/PLATELET - Abnormal; Notable for the following components:   WBC 12.9 (*)    Hemoglobin 11.2 (*)    HCT 34.5 (*)    RDW 15.9 (*)    Platelets 606 (*)    Neutro Abs 8.3 (*)    Monocytes Absolute 1.1 (*)    All other components within normal limits    EKG None  Radiology DG Chest Port 1 View  Result Date: 08/30/2022 CLINICAL DATA:  Cough EXAM: PORTABLE CHEST 1 VIEW COMPARISON:  05/12/2022 FINDINGS: Studies AP lordotic. Heart size is accentuated by the technique. No confluent airspace opacities or effusions. No acute bony abnormality. IMPRESSION: AP lordotic study.  No definite acute process. Electronically Signed   By: Charlett Nose M.D.   On: 08/30/2022 22:45    Procedures Procedures    Medications Ordered in ED Medications  sodium chloride 0.9 %  bolus 1,000 mL (1,000 mLs Intravenous New Bag/Given 08/30/22 2154)  ketorolac (TORADOL) 30 MG/ML injection 30 mg (30 mg Intravenous Given 08/30/22 2156)  metoCLOPramide (REGLAN) injection 5 mg (5 mg Intravenous Given 08/30/22 2155)  diphenhydrAMINE (BENADRYL) injection 12.5 mg (12.5 mg Intravenous Given 08/30/22 2156)    ED  Course/ Medical Decision Making/ A&P Clinical Course as of 08/30/22 2305  Sat Aug 30, 2022  2243 Patient reports resolution of her headache.  States her nausea has significantly improved as well. [AS]    Clinical Course User Index [AS] Anneta Rounds, Edsel Petrin, PA-C                             Medical Decision Making Amount and/or Complexity of Data Reviewed Labs: ordered. Radiology: ordered.  Risk Prescription drug management.  This patient presents to the ED for concern of nausea, headache, cough, this involves an extensive number of treatment options, and is a complaint that carries with it a high risk of complications and morbidity. Emergent considerations for headache include subarachnoid hemorrhage, meningitis, temporal arteritis, glaucoma, cerebral ischemia, carotid/vertebral dissection, intracranial tumor, Venous sinus thrombosis, carbon monoxide poisoning, acute or chronic subdural hemorrhage.  Other considerations include: Migraine, Cluster headache, Hypertension, Caffeine, alcohol, or drug withdrawal, Pseudotumor cerebri, Arteriovenous malformation, Head injury, Neurocysticercosis, Post-lumbar puncture, Preeclampsia, Tension headache, Sinusitis, Cervical arthritis, Refractive error causing strain, Dental abscess, Otitis media, Temporomandibular joint syndrome, Depression, Somatoform disorder (eg, somatization) Trigeminal neuralgia, Glossopharyngeal neuralgia.   Co morbidities that complicate the patient evaluation  hypertension, anemia  My initial workup includes basic labs, symptom control, chest x-ray  Additional history obtained from: Nursing notes from this  visit. Previous records within EMR system urgent care visit on 08/28/2022 Family son is at bedside and provides a portion of the history EMS provided portion of the history  I ordered, reviewed and interpreted labs which include: BMP, CBC. Leukocytosis of 12.9 which is similar to previous values. Borderline anemia with a hemoglobin of 11.2. thrombocytosis of 606.  Hyponatremia of 134  I ordered imaging studies including chest x-ray I independently visualized and interpreted imaging which showed normal I agree with the radiologist interpretation  Cardiac Monitoring:  The patient was maintained on a cardiac monitor.  I personally viewed and interpreted the cardiac monitored which showed an underlying rhythm of: NSR  Afebrile, hemodynamically stable.  57 year old female presenting to the ED for evaluation of a headache and nausea.  Symptoms have been present for the past 5 days.  She has not had any vomiting.  She denies neurologic complaints.  Her neurologic exam is reassuring.  No signs or symptoms of meningitis.  Her headache is fairly consistent with a tension type headache.  She reported full resolution of her symptoms after treatment in the ED.  Her overall reassuring as well.  Chest x-ray shows no acute abnormalities.  Patient was encouraged to follow-up with her primary care provider in 1 week for reevaluation of her symptoms.  She was given return precautions.  Stable at discharge.  At this time there does not appear to be any evidence of an acute emergency medical condition and the patient appears stable for discharge with appropriate outpatient follow up. Diagnosis was discussed with patient who verbalizes understanding of care plan and is agreeable to discharge. I have discussed return precautions with patient and son who verbalizes understanding. Patient encouraged to follow-up with their PCP within 1 week. All questions answered.  Note: Portions of this report may have been transcribed  using voice recognition software. Every effort was made to ensure accuracy; however, inadvertent computerized transcription errors may still be present.        Final Clinical Impression(s) / ED Diagnoses Final diagnoses:  Bad headache  Nausea    Rx / DC Orders ED  Discharge Orders          Ordered    metoCLOPramide (REGLAN) 10 MG tablet  Every 6 hours PRN        08/30/22 2305              Mora Bellman 08/30/22 2305    Virgina Norfolk, DO 08/30/22 2320

## 2022-09-01 ENCOUNTER — Encounter: Payer: Self-pay | Admitting: Pulmonary Disease

## 2022-09-01 ENCOUNTER — Other Ambulatory Visit: Payer: Self-pay | Admitting: Pulmonary Disease

## 2022-09-01 ENCOUNTER — Ambulatory Visit (INDEPENDENT_AMBULATORY_CARE_PROVIDER_SITE_OTHER): Payer: 59 | Admitting: Family Medicine

## 2022-09-01 VITALS — BP 113/77 | HR 88 | Temp 98.1°F | Resp 16 | Wt 171.6 lb

## 2022-09-01 DIAGNOSIS — F419 Anxiety disorder, unspecified: Secondary | ICD-10-CM

## 2022-09-01 DIAGNOSIS — G8929 Other chronic pain: Secondary | ICD-10-CM | POA: Diagnosis not present

## 2022-09-01 DIAGNOSIS — M542 Cervicalgia: Secondary | ICD-10-CM

## 2022-09-01 DIAGNOSIS — F32A Depression, unspecified: Secondary | ICD-10-CM | POA: Diagnosis not present

## 2022-09-01 MED ORDER — MIRTAZAPINE 45 MG PO TABS: 45.0000 mg | ORAL_TABLET | Freq: Every day | ORAL | 1 refills | Status: DC

## 2022-09-01 MED ORDER — BELSOMRA 20 MG PO TABS: 1.0000 | ORAL_TABLET | Freq: Every day | ORAL | 2 refills | Status: DC

## 2022-09-01 MED ORDER — TEMAZEPAM 30 MG PO CAPS: 30.0000 mg | ORAL_CAPSULE | Freq: Every evening | ORAL | 3 refills | Status: DC | PRN

## 2022-09-01 NOTE — Progress Notes (Unsigned)
Patient is here for their 3 month follow-up Patient has no concerns today Care gaps have been discussed with patient  

## 2022-09-01 NOTE — Telephone Encounter (Signed)
Patient checking on message sent. Patient phone number is [phone].

## 2022-09-01 NOTE — Telephone Encounter (Signed)
I called [facility] and the pharmasist told me pt had medication transferred to another pharmacy so they could fill her prescriptions. So, I spoke to pt and she said [facility]'t [facility] for BELSOMRA 20 MG TABS or temazepam (RESTORIL) 30 MG capsule. Then [facility] called [facility] and they told me that [facility] and that is why they will not refill her prescriptions. I called pt again and informed her her of what I was told by a Production designer, theatre/television/film. Pt asked if I could send script to [facility]. I have pended medications and will have Dr Wynona Neat sign off. Nothing further needed.

## 2022-09-01 NOTE — Telephone Encounter (Signed)
Patient states meds needs to go to [facility] [address]. Patient phone number is [phone].

## 2022-09-02 ENCOUNTER — Encounter: Payer: Self-pay | Admitting: Family Medicine

## 2022-09-02 NOTE — Progress Notes (Signed)
Established Patient Office Visit  Subjective    Patient ID: Natalie Edwards, female    DOB: [birth year]  Age: 57 y.o. MRN: [id number]  CC:  Chief Complaint  Patient presents with   Follow-up    HPI Natalie Edwards presents for follow up of depression/anxiety. She denies acute complaints or concerns.    Outpatient Encounter Medications as of 09/01/2022  Medication Sig   amLODipine-benazepril (LOTREL) 10-40 MG capsule Take 1 capsule by mouth daily.   esomeprazole (NEXIUM) 40 MG capsule Take 1 capsule (40 mg total) by mouth 2 (two) times daily.   hydrochlorothiazide (HYDRODIURIL) 25 MG tablet Take 1 tablet (25 mg total) by mouth daily.   loratadine (CLARITIN) 10 MG tablet Take 1 tablet (10 mg total) by mouth daily.   metoCLOPramide (REGLAN) 10 MG tablet Take 1 tablet (10 mg total) by mouth every 6 (six) hours as needed for nausea.   ondansetron (ZOFRAN-ODT) 4 MG disintegrating tablet Take 1 tablet (4 mg total) by mouth every 8 (eight) hours as needed for nausea or vomiting.   pregabalin (LYRICA) 75 MG capsule Take by mouth.   traMADol (ULTRAM) 50 MG tablet Take 1 tablet (50 mg total) by mouth every 6 (six) hours as needed (pain).   [DISCONTINUED] BELSOMRA 20 MG TABS Take 1 tablet (20 mg total) by mouth at bedtime.   [DISCONTINUED] diazepam (VALIUM) 5 MG tablet Take by mouth.   [DISCONTINUED] mirtazapine (REMERON) 45 MG tablet Take 1 tablet (45 mg total) by mouth at bedtime.   [DISCONTINUED] temazepam (RESTORIL) 30 MG capsule Take 1 capsule (30 mg total) by mouth at bedtime as needed.   mirtazapine (REMERON) 45 MG tablet Take 1 tablet (45 mg total) by mouth at bedtime.   No facility-administered encounter medications on file as of 09/01/2022.    Past Medical History:  Diagnosis Date   Allergy    Anemia    Diverticulosis    Gall bladder stones 1993   Gastric ulcer    GERD (gastroesophageal reflux disease)    Hypertension    Renal mass     Past Surgical History:  Procedure  Laterality Date   ABDOMINAL HYSTERECTOMY     CHOLECYSTECTOMY      Family History  Problem Relation Age of Onset   Cancer Maternal Uncle        Lung   Cancer Maternal Uncle        Lung   Cancer Maternal Uncle        Lung   Headache Neg Hx        "I don't think so"   Migraines Neg Hx        "I don't think so"    Social History   Socioeconomic History   Marital status: Single    Spouse name: Not on file   Number of children: 3   Years of education: Not on file   Highest education level: High school graduate  Occupational History   Not on file  Tobacco Use   Smoking status: Every Day    Packs/day: 0.25    Years: 25.00    Additional pack years: 0.00    Total pack years: 6.25    Types: Cigarettes   Smokeless tobacco: Never   Tobacco comments:    Pt tried to quit - helps with anxiety     1 pack last patient 3 days   Vaping Use   Vaping Use: Never used  Substance and Sexual Activity   Alcohol use: Yes  Drug use: No   Sexual activity: Yes    Comment: hysterectomy  Other Topics Concern   Not on file  Social History Narrative   Lives at home in an apartment. Her mother lives with her.    Right handed   Caffeine: drinks approx. 36 oz of pepsi per day. Sometimes drinks 2 cups of coffee in a day as well.    Social Determinants of Health   Financial Resource Strain: High Risk (05/22/2022)   Overall Financial Resource Strain (CARDIA)    Difficulty of Paying Living Expenses: Very hard  Food Insecurity: No Food Insecurity (06/16/2022)   Hunger Vital Sign    Worried About Running Out of Food in the Last Year: Never true    Ran Out of Food in the Last Year: Never true  Transportation Needs: No Transportation Needs (05/22/2022)   PRAPARE - Administrator, Civil Service (Medical): No    Lack of Transportation (Non-Medical): No  Physical Activity: Sufficiently Active (05/22/2022)   Exercise Vital Sign    Days of Exercise per Week: 4 days    Minutes of Exercise per  Session: 70 min  Stress: Stress Concern Present (05/22/2022)   [employer] of Occupational Health - Occupational Stress Questionnaire    Feeling of Stress : Very much  Social Connections: Moderately Isolated (05/22/2022)   Social Connection and Isolation Panel [NHANES]    Frequency of Communication with Friends and Family: More than three times a week    Frequency of Social Gatherings with Friends and Family: Never    Attends Religious Services: Never    Database administrator or Organizations: Yes    Attends Banker Meetings: Never    Marital Status: Never married  Intimate Partner Violence: Not At Risk (05/22/2022)   Humiliation, Afraid, Rape, and Kick questionnaire    Fear of Current or Ex-Partner: No    Emotionally Abused: No    Physically Abused: No    Sexually Abused: No    Review of Systems  All other systems reviewed and are negative.       Objective    BP 113/77   Pulse 88   Temp 98.1 F (36.7 C) (Oral)   Resp 16   Wt 171 lb 9.6 oz (77.8 kg)   SpO2 95%   BMI 30.40 kg/m   Physical Exam Vitals and nursing note reviewed.  Constitutional:      General: She is not in acute distress. Cardiovascular:     Rate and Rhythm: Normal rate and regular rhythm.  Pulmonary:     Effort: Pulmonary effort is normal.     Breath sounds: Normal breath sounds.  Abdominal:     Palpations: Abdomen is soft.     Tenderness: There is no abdominal tenderness.  Musculoskeletal:     Cervical back: Neck supple. Pain with movement present. Decreased range of motion.  Neurological:     General: No focal deficit present.     Mental Status: She is alert and oriented to person, place, and time.         Assessment & Plan:   1. Anxiety and depression Appears stable. Continue. Meds refilled.   2. Chronic neck pain Appears stable. Continue     No follow-ups on file.   Tommie Raymond, MD

## 2022-09-03 ENCOUNTER — Telehealth: Payer: Self-pay

## 2022-09-03 NOTE — Telephone Encounter (Signed)
Called to schedule appt. Patient called back wanting to switch with another patient.

## 2022-09-04 ENCOUNTER — Ambulatory Visit: Payer: Self-pay | Admitting: *Deleted

## 2022-09-05 NOTE — Patient Outreach (Signed)
  Care Coordination   Follow Up Visit Note   09/05/2022 Name: Natalie Edwards MRN: [id number] DOB: 1965/10/04  Natalie Edwards is a 57 y.o. year old female who sees Natalie Skeans, MD for primary care. I spoke with  Natalie Edwards by phone today.  What matters to the patients health and wellness today?  Work sent me home because of my back- they are paying my rent this month- concerns around finances.    Goals Addressed             This Visit's Progress    Reduce stress related to financial strains       Activities and task to complete in order to accomplish goals.   Pursue applying for Short Term Disability through work if necessary. Find ways to remember your mom on her birthday on 09/06/22   Consider writing a letter to your mom and taking it with you to the [place] Enjoy your weekend trip with cousins Review material emailed to you on grief counseling Follow up with housing resources discussed (call to followup on all possible leads and check back in for updates periodically) Follow up on food resources discussed (food pantry, etc ) Pursue counseling- complete paperwork being emailed to you from [facility] to schedule your counseling appointment.(I emailed to you) Keep all upcoming appointments   Continue with compliance of taking medication prescribed by Doctor Self Support options  (continue to seek assistance from Land[employer], community resource/agency, etc as discussed )  Consider alternative housing options as you mentioned Seek alternative work opportunities at current employer as well as other- [employer], [employer]        SDOH assessments and interventions completed:  Yes     Care Coordination Interventions:  Yes, provided  Interventions Today    Flowsheet Row Most Recent Value  Chronic Disease   Chronic disease during today's visit Other  [depression/anxiety]  Mental Health Interventions   Mental Health Discussed/Reviewed Mental Health Discussed,  Coping Strategies, Depression, Grief and Loss  [Pt plans to honor her mother's bday by visiting her Natalie Edwards this weekend. Pt to complete [facility] paperwork for counseling intake]       Follow up plan: Follow up call scheduled for 09/15/22    Encounter Outcome:  Pt. Visit Completed

## 2022-09-05 NOTE — Patient Instructions (Signed)
Visit Information  Thank you for taking time to visit with me today. Please don't hesitate to contact me if I can be of assistance to you.   Following are the goals we discussed today:   Goals Addressed             This Visit's Progress    Reduce stress related to financial strains       Activities and task to complete in order to accomplish goals.   Pursue applying for Short Term Disability through work if necessary. Find ways to remember your mom on her birthday on 09/06/22   Consider writing a letter to your mom and taking it with you to the [place] Enjoy your weekend trip with cousins Review material emailed to you on grief counseling Follow up with housing resources discussed (call to followup on all possible leads and check back in for updates periodically) Follow up on food resources discussed (food pantry, etc ) Pursue counseling- complete paperwork being emailed to you from [facility] to schedule your counseling appointment.(I emailed to you) Keep all upcoming appointments   Continue with compliance of taking medication prescribed by Doctor Self Support options  (continue to seek assistance from [facility], community resource/agency, etc as discussed )  Consider alternative housing options as you mentioned Seek alternative work opportunities at current employer as well as other- [employer], [employer],etc        Our next appointment is by telephone on 09/15/22  Please call the care guide team at [phone] if you need to cancel or reschedule your appointment.   If you are experiencing a Mental Health or Behavioral Health Crisis or need someone to talk to, please call the Suicide and Crisis Lifeline: 988 call 911   The patient verbalized understanding of instructions, educational materials, and care plan provided today and DECLINED offer to receive copy of patient instructions, educational materials, and care plan.   Telephone follow up appointment with  care management team member scheduled for:09/15/22  Reece Levy, MSW, LCSW Clinical Social Worker [facility] [phone]

## 2022-09-12 DIAGNOSIS — M545 Low back pain, unspecified: Secondary | ICD-10-CM | POA: Diagnosis not present

## 2022-09-12 DIAGNOSIS — M5412 Radiculopathy, cervical region: Secondary | ICD-10-CM | POA: Diagnosis not present

## 2022-09-15 ENCOUNTER — Encounter: Payer: Self-pay | Admitting: *Deleted

## 2022-09-16 ENCOUNTER — Ambulatory Visit: Payer: 59 | Admitting: *Deleted

## 2022-09-16 ENCOUNTER — Ambulatory Visit: Payer: 59 | Admitting: Family Medicine

## 2022-09-16 NOTE — Patient Instructions (Signed)
Visit Information  Thank you for taking time to visit with me today. Please don't hesitate to contact me if I can be of assistance to you.   Following are the goals we discussed today:   Goals Addressed             This Visit's Progress    Reduce stress related to financial strains       Activities and task to complete in order to accomplish goals.   Pursue applying for Short Term Disability through work if necessary. Find ways to remember your mom as you are facing the "firsts" in this first year since her passing Consider writing a letter to your mom and taking it to the gravesite Review material emailed to you on grief counseling Follow up with housing resources discussed (call to followup on all possible leads and check back in for updates periodically) Follow up on food resources discussed (food pantry, etc ) Expect phone call from [facility] to schedule your counseling appointment.(I spoke to them today and confirmed paperwork completed and they plan to call you) Keep all upcoming appointments   Continue with compliance of taking medication prescribed by Doctor Self Support options  (continue to seek assistance from [facility], community resource/agency, etc as discussed )  Consider alternative housing options as you mentioned Seek alternative work opportunities at current employer as well as other- [employer], [employer],etc        Our next appointment is by telephone on 09/24/22  Please call the care guide team at [phone] if you need to cancel or reschedule your appointment.   If you are experiencing a Mental Health or Behavioral Health Crisis or need someone to talk to, please call the Suicide and Crisis Lifeline: 988   The patient verbalized understanding of instructions, educational materials, and care plan provided today and DECLINED offer to receive copy of patient instructions, educational materials, and care plan.   Telephone follow up appointment  with care management team member scheduled for: 09/24/22  Reece Levy, MSW, LCSW Clinical Social Worker [facility] [phone]

## 2022-09-16 NOTE — Patient Outreach (Signed)
  Care Coordination   Follow Up Visit Note   09/16/2022 Name: Natalie Edwards MRN: [id number] DOB: Jun 04, 1965  Natalie Edwards is a 57 y.o. year old female who sees [name], MD for primary care. I spoke with  Natalie Edwards by phone today.  What matters to the patients health and wellness today?  Still working- grieving mom's passing.    Goals Addressed             This Visit's Progress    Reduce stress related to financial strains       Activities and task to complete in order to accomplish goals.   Pursue applying for Short Term Disability through work if necessary. Find ways to remember your mom as you are facing the "firsts" in this first year since her passing Consider writing a letter to your mom and taking it to the gravesite Review material emailed to you on grief counseling Follow up with housing resources discussed (call to followup on all possible leads and check back in for updates periodically) Follow up on food resources discussed (food pantry, etc ) Expect phone call from [facility] to schedule your counseling appointment.(I spoke to them today and confirmed paperwork completed and they plan to call you) Keep all upcoming appointments   Continue with compliance of taking medication prescribed by Doctor Self Support options  (continue to seek assistance from [employer], community resource/agency, etc as discussed )  Consider alternative housing options as you mentioned Seek alternative work opportunities at current employer as well as other- [employer], [employer]        SDOH assessments and interventions completed:  Yes     Care Coordination Interventions:  Yes, provided  Interventions Today    Flowsheet Row Most Recent Value  Chronic Disease   Chronic disease during today's visit Other  [grief/depression]  General Interventions   General Interventions Discussed/Reviewed Tax adviser sent to [employer] for counseling support]   Mental Health Interventions   Mental Health Discussed/Reviewed Mental Health Discussed, Mental Health Reviewed, Coping Strategies, Depression, Grief and Loss  [CSW called [facility] and confirmed paperwork received and will call pt to schedule visit]       Follow up plan: Follow up call scheduled for     09/24/22 Encounter Outcome:  Pt. Visit Completed

## 2022-09-24 ENCOUNTER — Ambulatory Visit: Payer: Self-pay | Admitting: *Deleted

## 2022-09-24 ENCOUNTER — Ambulatory Visit
Admission: EM | Admit: 2022-09-24 | Discharge: 2022-09-24 | Disposition: A | Discharge status: Home / Self Care | Payer: 59 | Attending: Family Medicine | Admitting: Family Medicine

## 2022-09-24 DIAGNOSIS — J309 Allergic rhinitis, unspecified: Secondary | ICD-10-CM | POA: Diagnosis not present

## 2022-09-24 DIAGNOSIS — J069 Acute upper respiratory infection, unspecified: Secondary | ICD-10-CM

## 2022-09-24 DIAGNOSIS — R0982 Postnasal drip: Secondary | ICD-10-CM | POA: Diagnosis not present

## 2022-09-24 DIAGNOSIS — R1084 Generalized abdominal pain: Secondary | ICD-10-CM | POA: Diagnosis not present

## 2022-09-24 DIAGNOSIS — R051 Acute cough: Secondary | ICD-10-CM | POA: Diagnosis not present

## 2022-09-24 DIAGNOSIS — R112 Nausea with vomiting, unspecified: Secondary | ICD-10-CM | POA: Diagnosis not present

## 2022-09-24 MED ORDER — PROMETHAZINE-DM 6.25-15 MG/5ML PO SYRP: 5.0000 mL | ORAL_SOLUTION | Freq: Three times a day (TID) | ORAL | 0 refills | Status: DC | PRN

## 2022-09-24 MED ORDER — ONDANSETRON 4 MG PO TBDP
4.0000 mg | ORAL_TABLET | Freq: Once | ORAL | Status: AC
  Administered 2022-09-24: 4 mg via ORAL

## 2022-09-24 MED ORDER — LORATADINE 10 MG PO TABS
10.0000 mg | ORAL_TABLET | Freq: Every day | ORAL | 0 refills | Status: DC | PRN
Start: 2022-09-24 — End: 2023-01-18

## 2022-09-24 MED ORDER — LIDOCAINE VISCOUS HCL 2 % MT SOLN
15.0000 mL | Freq: Once | OROMUCOSAL | Status: AC
  Administered 2022-09-24: 15 mL via OROMUCOSAL

## 2022-09-24 MED ORDER — ALUM & MAG HYDROXIDE-SIMETH 200-200-20 MG/5ML PO SUSP
30.0000 mL | Freq: Once | ORAL | Status: AC
  Administered 2022-09-24: 30 mL via ORAL

## 2022-09-24 MED ORDER — FLUTICASONE PROPIONATE 50 MCG/ACT NA SUSP: 1.0000 | Freq: Two times a day (BID) | NASAL | 0 refills | Status: DC | PRN

## 2022-09-24 MED ORDER — METOCLOPRAMIDE HCL 10 MG PO TABS: 10.0000 mg | ORAL_TABLET | Freq: Four times a day (QID) | ORAL | 0 refills | Status: DC

## 2022-09-24 NOTE — Patient Outreach (Signed)
  Care Coordination   Follow Up Visit Note   09/24/2022 Name: Natalie Edwards MRN: [id number] DOB: 1965/12/18  Natalie Edwards is a 57 y.o. year old female who sees Natalie Skeans, MD for primary care. I spoke with  Natalie Edwards by phone today.  What matters to the patients health and wellness today?  Need to find work. Mental health team in place.    Goals Addressed             This Visit's Progress    Reduce stress related to financial strains       Activities and task to complete in order to accomplish goals.   Keep seeking/applying for work- hope you get the job you mentioned today Seek renters insurance to reduce the cost of your rent Find ways to remember your mom as you are facing the "firsts" in this first year since her passing Consider writing a letter to your mom and taking it to the gravesite Review material emailed to you on grief counseling *sent email again today Follow up with housing resources discussed (call to followup on all possible leads and check back in for updates periodically) Follow up on food resources discussed (food pantry, etc ) Continue with your next counseling session at [facility]- and plans for visit with their Provider for medication management Keep all upcoming appointments   Continue with compliance of taking medication prescribed by Doctor Self Support options  (continue to seek assistance from [employer], community resource/agency, etc as discussed )  Consider alternative housing options as you mentioned Seek alternative work opportunities at current employer as well as other- [employer], [employer]        SDOH assessments and interventions completed:  Yes     Care Coordination Interventions:  Yes, provided  Interventions Today    Flowsheet Row Most Recent Value  Chronic Disease   Chronic disease during today's visit Other  [grief/depression]  General Interventions   General Interventions Discussed/Reviewed Community  Resources  Mental Health Interventions   Mental Health Discussed/Reviewed Coping Strategies, Depression, Grief and Loss, Mental Health Discussed  [Pt reports attending her first session with Counselor,  "it was tense but was good". plans to have nxt session 10/01/22 and Provider for medication managment the day after-]       Follow up plan:  7 /24/24   Encounter Outcome:  Pt. Visit Completed

## 2022-09-24 NOTE — ED Provider Notes (Signed)
Natalie Edwards    CSN: [id number] Arrival date & time: 09/24/22  0932      History   Chief Complaint Chief Complaint  Patient presents with   Abdominal Pain    N&V (recurrent). Epigastric pain, URI symptoms,    HPI Natalie Edwards is a 57 y.o. female, with history of diverticulosis, GERD, HTN, and chronic gastric ulcer.  HPI Patient here today with multiple symptoms persisting over the last 48 hours.  Patient reports she has had epigastric pain along with nausea and vomiting intermittently over the last few weeks but over the last 48 hours symptoms have been persistent.  She reports that she has been unable to eat any food however has been able to drink sips of fluid.  She is also having nasal congestion and sinus pressure.  Reports normal bowel movements and normal urinary habits.  Is followed by gastroenterologist and is prescribed medications for GERD and reports she has been compliant with taking her medications.  She is scheduled to see GI in August as it was recommended that she obtain a endoscopy.  She reports that she was prescribed a medication in the past for nausea that helped more so than Zofran.  On chart review she was prescribed Reglan at some point and she requests refill.  Denies any fever. Past Medical History:  Diagnosis Date   Allergy    Anemia    Diverticulosis    Gall bladder stones 1993   Gastric ulcer    GERD (gastroesophageal reflux disease)    Hypertension    Renal mass     Patient Active Problem List   Diagnosis Date Noted   AKI (acute kidney injury) (HCC) 09/28/2022   Hyperlipidemia 12/14/2021   Prediabetes 12/14/2021   Anxiety and depression 05/14/2021   Breathing difficult 05/14/2021   Upper respiratory tract infection 05/14/2021   GERD (gastroesophageal reflux disease)    Visit for routine gyn exam 01/14/2021   Screening mammogram, encounter for 01/14/2021   Possible exposure to STD 01/14/2021   Chronic insomnia 10/18/2019   CKD  (chronic kidney disease) 04/07/2019   GAD (generalized anxiety disorder) 05/07/2018   Chronic gastric ulcer 05/07/2018   Essential hypertension 05/07/2018   Iron deficiency anemia due to chronic blood loss 09/23/2016   Peptic ulcer disease with hemorrhage 09/23/2016   Thrombocytosis 09/09/2016   Leukocytosis 09/09/2016   H/O ulcer disease 07/31/2016    Past Surgical History:  Procedure Laterality Date   ABDOMINAL HYSTERECTOMY     CHOLECYSTECTOMY      OB History     Gravida  7   Para  3   Term  3   Preterm      AB  4   Living         SAB      IAB  4   Ectopic      Multiple      Live Births               Home Medications    Prior to Admission medications   Medication Sig Start Date End Date Taking? Authorizing Provider  fluticasone (FLONASE) 50 MCG/ACT nasal spray Place 1 spray into both nostrils 2 (two) times daily as needed for allergies or rhinitis. 09/24/22  Yes Bing Neighbors, NP  metoCLOPramide (REGLAN) 10 MG tablet Take 1 tablet (10 mg total) by mouth every 6 (six) hours. 09/24/22  Yes Bing Neighbors, NP  promethazine-dextromethorphan (PROMETHAZINE-DM) 6.25-15 MG/5ML syrup Take 5 mLs by mouth  3 (three) times daily as needed for cough. 09/24/22  Yes Bing Neighbors, NP  amLODipine-benazepril (LOTREL) 10-40 MG capsule Take 1 capsule by mouth daily. 08/28/22 11/26/22  [name], MD  BELSOMRA 20 MG TABS Take 1 tablet (20 mg total) by mouth at bedtime. 09/01/22   Tomma Lightning, MD  esomeprazole (NEXIUM) 40 MG capsule Take 1 capsule (40 mg total) by mouth 2 (two) times daily. 06/09/22   Tressia Danas, MD  hydrochlorothiazide (HYDRODIURIL) 25 MG tablet Take 1 tablet (25 mg total) by mouth daily. 08/28/22 11/26/22  [name], MD  loratadine (CLARITIN) 10 MG tablet Take 1 tablet (10 mg total) by mouth daily as needed for allergies. 09/24/22   Bing Neighbors, NP  meloxicam (MOBIC) 15 MG tablet Take 15 mg by mouth daily. 09/20/22    [provider]  mirtazapine (REMERON) 45 MG tablet Take 1 tablet (45 mg total) by mouth at bedtime. 09/01/22   [name], MD  ondansetron (ZOFRAN-ODT) 4 MG disintegrating tablet Take 1 tablet (4 mg total) by mouth every 8 (eight) hours as needed for nausea or vomiting. 08/28/22   Zenia Resides, MD  pregabalin (LYRICA) 75 MG capsule Take by mouth. 06/23/22   [provider]  temazepam (RESTORIL) 30 MG capsule Take 1 capsule (30 mg total) by mouth at bedtime as needed. 09/01/22   Olalere, Minna Antis, MD  traMADol (ULTRAM) 50 MG tablet Take 1 tablet (50 mg total) by mouth every 6 (six) hours as needed (pain). 08/28/22   Zenia Resides, MD    Family History Family History  Problem Relation Age of Onset   Cancer Maternal Uncle        Lung   Cancer Maternal Uncle        Lung   Cancer Maternal Uncle        Lung   Headache Neg Hx        "I don't think so"   Migraines Neg Hx        "I don't think so"    Social History Social History   Tobacco Use   Smoking status: Every Day    Current packs/day: 0.25    Average packs/day: 0.3 packs/day for 25.0 years (6.3 ttl pk-yrs)    Types: Cigarettes   Smokeless tobacco: Never   Tobacco comments:    Pt tried to quit - helps with anxiety     1 pack last patient 3 days   Vaping Use   Vaping status: Never Used  Substance Use Topics   Alcohol use: Yes   Drug use: No     Allergies   Hydroxyzine and Ambien [zolpidem tartrate]   Review of Systems Review of Systems Pertinent negatives listed in HPI   Physical Exam Triage Vital Signs ED Triage Vitals  Enc Vitals Group     BP --      Pulse Rate 09/24/22 1144 (!) 108     Resp 09/24/22 1144 20     Temp 09/24/22 1144 98 F (36.7 C)     Temp Source 09/24/22 1144 Oral     SpO2 09/24/22 1144 94 %     Weight --      Height --      Head Circumference --      Peak Flow --      Pain Score 09/24/22 1145 8     Pain Loc --      Pain Edu? --      Excl. in  GC? --     No data found.  Updated Vital Signs Pulse (!) 108   Temp 98 F (36.7 C) (Oral)   Resp 20   SpO2 94%   Visual Acuity Right Eye Distance:   Left Eye Distance:   Bilateral Distance:    Right Eye Near:   Left Eye Near:    Bilateral Near:     Physical Exam Vitals reviewed.  Constitutional:      Appearance: She is well-developed.  HENT:     Head: Normocephalic and atraumatic.     Right Ear: Tympanic membrane, ear canal and external ear normal.     Left Ear: Tympanic membrane, ear canal and external ear normal.     Nose: Congestion and rhinorrhea present.  Eyes:     Extraocular Movements: Extraocular movements intact.     Conjunctiva/sclera: Conjunctivae normal.     Pupils: Pupils are equal, round, and reactive to light.  Cardiovascular:     Rate and Rhythm: Regular rhythm. Tachycardia present.  Pulmonary:     Effort: Pulmonary effort is normal.     Breath sounds: Normal breath sounds.  Abdominal:     General: Abdomen is scaphoid. Bowel sounds are increased. There is no distension.     Palpations: Abdomen is soft.     Tenderness: There is abdominal tenderness in the epigastric area.  Musculoskeletal:     Cervical back: Normal range of motion.  Lymphadenopathy:     Cervical: No cervical adenopathy.  Skin:    General: Skin is warm and dry.  Neurological:     General: No focal deficit present.     Mental Status: She is alert.      UC Treatments / Results  Labs (all labs ordered are listed, but only abnormal results are displayed) Labs Reviewed - No data to display  EKG   Radiology   Procedures Procedures (including critical care time)  Medications Ordered in UC Medications  ondansetron (ZOFRAN-ODT) disintegrating tablet 4 mg (4 mg Oral Given 09/24/22 1148)  alum & mag hydroxide-simeth (MAALOX/MYLANTA) 200-200-20 MG/5ML suspension 30 mL (30 mLs Oral Given 09/24/22 1217)  lidocaine (XYLOCAINE) 2 % viscous mouth solution 15 mL (15 mLs Mouth/Throat Given  09/24/22 1217)    Initial Impression / Assessment and Plan / UC Course  I have reviewed the triage vital signs and the nursing notes.  Pertinent labs & imaging results that were available during my care of the patient were reviewed by me and considered in my medical decision making (see chart for details).    Patient today with multiple symptoms including upper respiratory symptoms and recurrent GI symptoms which is a chronic problem for patient related to GERD.  Treating symptoms per discharge medication orders.  Patient advised to continue taking her GERD medications.  Limitations of urgent care to completely workup causes of abdominal pain patient has been given ED precautions if any of her symptoms related to her epigastric pain worsen or become severe she is to go to the emergency department.  Patient verbalized understanding and agreement with the plan. Final Clinical Impressions(s) / UC Diagnoses   Final diagnoses:  Upper respiratory infection, acute  Post-nasal drainage  Generalized abdominal discomfort  Acute cough  Nausea and vomiting, unspecified vomiting type     Discharge Instructions      I am treating you for an upper respiratory illness which I think has triggered your acid reflux which is causing the abdominal pain nausea and vomiting.  I have refilled your  metoclopramide and prescribed you Flonase, loratadine, and Promethazine DM to help with your upper respiratory cold symptoms.      ED Prescriptions     Medication Sig Dispense Auth. Provider   metoCLOPramide (REGLAN) 10 MG tablet Take 1 tablet (10 mg total) by mouth every 6 (six) hours. 30 tablet Bing Neighbors, NP   fluticasone (FLONASE) 50 MCG/ACT nasal spray Place 1 spray into both nostrils 2 (two) times daily as needed for allergies or rhinitis. 11.1 mL Bing Neighbors, NP   loratadine (CLARITIN) 10 MG tablet Take 1 tablet (10 mg total) by mouth daily as needed for allergies. 30 tablet Bing Neighbors, NP   promethazine-dextromethorphan (PROMETHAZINE-DM) 6.25-15 MG/5ML syrup Take 5 mLs by mouth 3 (three) times daily as needed for cough. 240 mL Bing Neighbors, NP      PDMP not reviewed this encounter.   Bing Neighbors, NP 09/28/22 1220

## 2022-09-24 NOTE — Patient Instructions (Signed)
Visit Information  Thank you for taking time to visit with me today. Please don't hesitate to contact me if I can be of assistance to you.   Following are the goals we discussed today:   Goals Addressed             This Visit's Progress    Reduce stress related to financial strains       Activities and task to complete in order to accomplish goals.   Keep seeking/applying for work- hope you get the job you mentioned today Seek renters insurance to reduce the cost of your rent Find ways to remember your mom as you are facing the "firsts" in this first year since her passing Consider writing a letter to your mom and taking it to the gravesite Review material emailed to you on grief counseling *sent email again today Follow up with housing resources discussed (call to followup on all possible leads and check back in for updates periodically) Follow up on food resources discussed (food pantry, etc ) Continue with your next counseling session at [facility]- and plans for visit with their Provider for medication management Keep all upcoming appointments   Continue with compliance of taking medication prescribed by Doctor Self Support options  (continue to seek assistance from [place], community resource/agency, etc as discussed )  Consider alternative housing options as you mentioned Seek alternative work opportunities at current employer as well as other- [employer], [employer],etc        Our next appointment is by telephone on 10/08/22  Please call the care guide team at [phone] if you need to cancel or reschedule your appointment.   If you are experiencing a Mental Health or Behavioral Health Crisis or need someone to talk to, please call the Suicide and Crisis Lifeline: 988 call 911   The patient verbalized understanding of instructions, educational materials, and care plan provided today and DECLINED offer to receive copy of patient instructions, educational  materials, and care plan.   Telephone follow up appointment with care management team member scheduled for:10/08/22  Reece Levy, MSW, LCSW Clinical Social Worker [employer] [employer] [phone]

## 2022-09-24 NOTE — Discharge Instructions (Addendum)
I am treating you for an upper respiratory illness which I think has triggered your acid reflux which is causing the abdominal pain nausea and vomiting.  I have refilled your metoclopramide and prescribed you Flonase, loratadine, and Promethazine DM to help with your upper respiratory cold symptoms.

## 2022-09-24 NOTE — ED Triage Notes (Signed)
Pt reports she has had some nausea, stomach pains, and headaches x 2 days.   Took tylenol but no relief. Ginger ale     Gastro appointment 11/04/22

## 2022-09-28 ENCOUNTER — Other Ambulatory Visit: Payer: Self-pay

## 2022-09-28 ENCOUNTER — Emergency Department (HOSPITAL_COMMUNITY): Payer: 59

## 2022-09-28 ENCOUNTER — Inpatient Hospital Stay (HOSPITAL_COMMUNITY)
Admission: EM | Admit: 2022-09-28 | Discharge: 2022-09-30 | DRG: 683 | Disposition: A | Discharge status: Home / Self Care | Payer: 59 | Attending: Internal Medicine | Admitting: Internal Medicine

## 2022-09-28 ENCOUNTER — Encounter (HOSPITAL_COMMUNITY): Payer: Self-pay | Admitting: *Deleted

## 2022-09-28 DIAGNOSIS — K259 Gastric ulcer, unspecified as acute or chronic, without hemorrhage or perforation: Secondary | ICD-10-CM | POA: Diagnosis not present

## 2022-09-28 DIAGNOSIS — R000 Tachycardia, unspecified: Secondary | ICD-10-CM | POA: Diagnosis not present

## 2022-09-28 DIAGNOSIS — G4700 Insomnia, unspecified: Secondary | ICD-10-CM | POA: Diagnosis not present

## 2022-09-28 DIAGNOSIS — R110 Nausea: Secondary | ICD-10-CM | POA: Diagnosis not present

## 2022-09-28 DIAGNOSIS — F17210 Nicotine dependence, cigarettes, uncomplicated: Secondary | ICD-10-CM | POA: Diagnosis present

## 2022-09-28 DIAGNOSIS — K921 Melena: Secondary | ICD-10-CM | POA: Diagnosis not present

## 2022-09-28 DIAGNOSIS — E872 Acidosis, unspecified: Secondary | ICD-10-CM | POA: Diagnosis present

## 2022-09-28 DIAGNOSIS — I10 Essential (primary) hypertension: Secondary | ICD-10-CM | POA: Diagnosis present

## 2022-09-28 DIAGNOSIS — R1013 Epigastric pain: Secondary | ICD-10-CM | POA: Diagnosis present

## 2022-09-28 DIAGNOSIS — Z801 Family history of malignant neoplasm of trachea, bronchus and lung: Secondary | ICD-10-CM

## 2022-09-28 DIAGNOSIS — N1831 Chronic kidney disease, stage 3a: Secondary | ICD-10-CM | POA: Diagnosis not present

## 2022-09-28 DIAGNOSIS — F419 Anxiety disorder, unspecified: Secondary | ICD-10-CM | POA: Diagnosis not present

## 2022-09-28 DIAGNOSIS — K3189 Other diseases of stomach and duodenum: Secondary | ICD-10-CM | POA: Diagnosis present

## 2022-09-28 DIAGNOSIS — D75839 Thrombocytosis, unspecified: Secondary | ICD-10-CM | POA: Diagnosis present

## 2022-09-28 DIAGNOSIS — Z6831 Body mass index (BMI) 31.0-31.9, adult: Secondary | ICD-10-CM

## 2022-09-28 DIAGNOSIS — N179 Acute kidney failure, unspecified: Secondary | ICD-10-CM | POA: Diagnosis not present

## 2022-09-28 DIAGNOSIS — N182 Chronic kidney disease, stage 2 (mild): Secondary | ICD-10-CM

## 2022-09-28 DIAGNOSIS — I129 Hypertensive chronic kidney disease with stage 1 through stage 4 chronic kidney disease, or unspecified chronic kidney disease: Secondary | ICD-10-CM | POA: Diagnosis not present

## 2022-09-28 DIAGNOSIS — Z79899 Other long term (current) drug therapy: Secondary | ICD-10-CM

## 2022-09-28 DIAGNOSIS — R109 Unspecified abdominal pain: Secondary | ICD-10-CM | POA: Diagnosis not present

## 2022-09-28 DIAGNOSIS — Z1152 Encounter for screening for COVID-19: Secondary | ICD-10-CM | POA: Diagnosis not present

## 2022-09-28 DIAGNOSIS — R7303 Prediabetes: Secondary | ICD-10-CM | POA: Diagnosis present

## 2022-09-28 DIAGNOSIS — K319 Disease of stomach and duodenum, unspecified: Secondary | ICD-10-CM | POA: Diagnosis not present

## 2022-09-28 DIAGNOSIS — E669 Obesity, unspecified: Secondary | ICD-10-CM | POA: Diagnosis present

## 2022-09-28 DIAGNOSIS — F32A Depression, unspecified: Secondary | ICD-10-CM | POA: Diagnosis present

## 2022-09-28 DIAGNOSIS — D509 Iron deficiency anemia, unspecified: Secondary | ICD-10-CM | POA: Diagnosis present

## 2022-09-28 DIAGNOSIS — Z5986 Financial insecurity: Secondary | ICD-10-CM

## 2022-09-28 DIAGNOSIS — K2970 Gastritis, unspecified, without bleeding: Secondary | ICD-10-CM | POA: Diagnosis present

## 2022-09-28 DIAGNOSIS — K439 Ventral hernia without obstruction or gangrene: Secondary | ICD-10-CM | POA: Diagnosis not present

## 2022-09-28 DIAGNOSIS — A419 Sepsis, unspecified organism: Secondary | ICD-10-CM

## 2022-09-28 DIAGNOSIS — Z0389 Encounter for observation for other suspected diseases and conditions ruled out: Secondary | ICD-10-CM | POA: Diagnosis not present

## 2022-09-28 DIAGNOSIS — K219 Gastro-esophageal reflux disease without esophagitis: Secondary | ICD-10-CM | POA: Diagnosis present

## 2022-09-28 DIAGNOSIS — N281 Cyst of kidney, acquired: Secondary | ICD-10-CM | POA: Diagnosis not present

## 2022-09-28 DIAGNOSIS — E860 Dehydration: Secondary | ICD-10-CM | POA: Diagnosis present

## 2022-09-28 DIAGNOSIS — E785 Hyperlipidemia, unspecified: Secondary | ICD-10-CM | POA: Diagnosis present

## 2022-09-28 DIAGNOSIS — Z87898 Personal history of other specified conditions: Secondary | ICD-10-CM | POA: Diagnosis not present

## 2022-09-28 DIAGNOSIS — Z791 Long term (current) use of non-steroidal anti-inflammatories (NSAID): Secondary | ICD-10-CM

## 2022-09-28 DIAGNOSIS — Z888 Allergy status to other drugs, medicaments and biological substances status: Secondary | ICD-10-CM | POA: Diagnosis not present

## 2022-09-28 DIAGNOSIS — K5730 Diverticulosis of large intestine without perforation or abscess without bleeding: Secondary | ICD-10-CM | POA: Diagnosis not present

## 2022-09-28 DIAGNOSIS — R6520 Severe sepsis without septic shock: Secondary | ICD-10-CM | POA: Diagnosis not present

## 2022-09-28 DIAGNOSIS — K317 Polyp of stomach and duodenum: Secondary | ICD-10-CM | POA: Diagnosis present

## 2022-09-28 DIAGNOSIS — D3502 Benign neoplasm of left adrenal gland: Secondary | ICD-10-CM | POA: Diagnosis not present

## 2022-09-28 DIAGNOSIS — R112 Nausea with vomiting, unspecified: Secondary | ICD-10-CM | POA: Diagnosis not present

## 2022-09-28 DIAGNOSIS — R197 Diarrhea, unspecified: Secondary | ICD-10-CM | POA: Diagnosis not present

## 2022-09-28 DIAGNOSIS — E8729 Other acidosis: Secondary | ICD-10-CM | POA: Diagnosis present

## 2022-09-28 LAB — URINALYSIS, ROUTINE W REFLEX MICROSCOPIC
Bacteria, UA: NONE SEEN
Bilirubin Urine: NEGATIVE
Glucose, UA: NEGATIVE mg/dL
Ketones, ur: 5 mg/dL — AB
Nitrite: NEGATIVE
Protein, ur: 30 mg/dL — AB
Specific Gravity, Urine: 1.015 (ref 1.005–1.030)
pH: 5 (ref 5.0–8.0)

## 2022-09-28 LAB — CBC WITH DIFFERENTIAL/PLATELET
Abs Immature Granulocytes: 0.08 10*3/uL — ABNORMAL HIGH (ref 0.00–0.07)
Basophils Absolute: 0 10*3/uL (ref 0.0–0.1)
Basophils Relative: 0 %
Eosinophils Absolute: 0.1 10*3/uL (ref 0.0–0.5)
Eosinophils Relative: 1 %
HCT: 44.9 % (ref 36.0–46.0)
Hemoglobin: 14.4 g/dL (ref 12.0–15.0)
Immature Granulocytes: 1 %
Lymphocytes Relative: 26 %
Lymphs Abs: 3.8 10*3/uL (ref 0.7–4.0)
MCH: 27.3 pg (ref 26.0–34.0)
MCHC: 32.1 g/dL (ref 30.0–36.0)
MCV: 85 fL (ref 80.0–100.0)
Monocytes Absolute: 1.3 10*3/uL — ABNORMAL HIGH (ref 0.1–1.0)
Monocytes Relative: 9 %
Neutro Abs: 9.4 10*3/uL — ABNORMAL HIGH (ref 1.7–7.7)
Neutrophils Relative %: 63 %
Platelets: 621 10*3/uL — ABNORMAL HIGH (ref 150–400)
RBC: 5.28 MIL/uL — ABNORMAL HIGH (ref 3.87–5.11)
RDW: 16.9 % — ABNORMAL HIGH (ref 11.5–15.5)
WBC: 14.6 10*3/uL — ABNORMAL HIGH (ref 4.0–10.5)
nRBC: 0 % (ref 0.0–0.2)

## 2022-09-28 LAB — RESP PANEL BY RT-PCR (RSV, FLU A&B, COVID)  RVPGX2
Influenza A by PCR: NEGATIVE
Influenza B by PCR: NEGATIVE
Resp Syncytial Virus by PCR: NEGATIVE
SARS Coronavirus 2 by RT PCR: NEGATIVE

## 2022-09-28 LAB — COMPREHENSIVE METABOLIC PANEL
ALT: 21 U/L (ref 0–44)
AST: 18 U/L (ref 15–41)
Albumin: 4.6 g/dL (ref 3.5–5.0)
Alkaline Phosphatase: 57 U/L (ref 38–126)
Anion gap: 13 (ref 5–15)
BUN: 44 mg/dL — ABNORMAL HIGH (ref 6–20)
CO2: 13 mmol/L — ABNORMAL LOW (ref 22–32)
Calcium: 9.8 mg/dL (ref 8.9–10.3)
Chloride: 110 mmol/L (ref 98–111)
Creatinine, Ser: 1.94 mg/dL — ABNORMAL HIGH (ref 0.44–1.00)
GFR, Estimated: 30 mL/min — ABNORMAL LOW (ref >60)
Glucose, Bld: 127 mg/dL — ABNORMAL HIGH (ref 70–99)
Potassium: 4.3 mmol/L (ref 3.5–5.1)
Sodium: 136 mmol/L (ref 135–145)
Total Bilirubin: 0.8 mg/dL (ref 0.3–1.2)
Total Protein: 8.6 g/dL — ABNORMAL HIGH (ref 6.5–8.1)

## 2022-09-28 LAB — PROTIME-INR
INR: 1.1 (ref 0.8–1.2)
Prothrombin Time: 14.5 seconds (ref 11.4–15.2)

## 2022-09-28 LAB — APTT: aPTT: 27 seconds (ref 24–36)

## 2022-09-28 LAB — LACTIC ACID, PLASMA
Lactic Acid, Venous: 1.2 mmol/L (ref 0.5–1.9)
Lactic Acid, Venous: 1.3 mmol/L (ref 0.5–1.9)

## 2022-09-28 LAB — LIPASE, BLOOD: Lipase: 29 U/L (ref 11–51)

## 2022-09-28 MED ORDER — LACTATED RINGERS IV BOLUS (SEPSIS)
1000.0000 mL | Freq: Once | INTRAVENOUS | Status: AC
  Administered 2022-09-28: 1000 mL via INTRAVENOUS

## 2022-09-28 MED ORDER — ONDANSETRON HCL 4 MG PO TABS: 4.0000 mg | ORAL_TABLET | Freq: Four times a day (QID) | ORAL | Status: DC | PRN

## 2022-09-28 MED ORDER — BISACODYL 5 MG PO TBEC: 5.0000 mg | DELAYED_RELEASE_TABLET | Freq: Every day | ORAL | Status: DC | PRN

## 2022-09-28 MED ORDER — ACETAMINOPHEN 325 MG PO TABS: 650.0000 mg | ORAL_TABLET | Freq: Four times a day (QID) | ORAL | Status: DC | PRN

## 2022-09-28 MED ORDER — POLYETHYLENE GLYCOL 3350 17 G PO PACK: 17.0000 g | PACK | Freq: Every day | ORAL | Status: DC | PRN

## 2022-09-28 MED ORDER — SODIUM CHLORIDE 0.9% FLUSH
3.0000 mL | Freq: Two times a day (BID) | INTRAVENOUS | Status: DC
  Administered 2022-09-28 – 2022-09-30 (×3): 3 mL via INTRAVENOUS

## 2022-09-28 MED ORDER — LACTATED RINGERS IV BOLUS
400.0000 mL | Freq: Once | INTRAVENOUS | Status: AC
  Administered 2022-09-28: 400 mL via INTRAVENOUS

## 2022-09-28 MED ORDER — SODIUM CHLORIDE 0.9 % IV BOLUS
1000.0000 mL | Freq: Once | INTRAVENOUS | Status: AC
  Administered 2022-09-28: 1000 mL via INTRAVENOUS

## 2022-09-28 MED ORDER — MORPHINE SULFATE (PF) 2 MG/ML IV SOLN
2.0000 mg | INTRAVENOUS | Status: DC | PRN
  Administered 2022-09-28 – 2022-09-29 (×3): 2 mg via INTRAVENOUS
  Filled 2022-09-28 (×3): qty 1

## 2022-09-28 MED ORDER — LACTATED RINGERS IV SOLN: INTRAVENOUS | Status: DC

## 2022-09-28 MED ORDER — FENTANYL CITRATE PF 50 MCG/ML IJ SOSY
50.0000 ug | PREFILLED_SYRINGE | Freq: Once | INTRAMUSCULAR | Status: AC
  Administered 2022-09-28: 50 ug via INTRAVENOUS
  Filled 2022-09-28: qty 1

## 2022-09-28 MED ORDER — TEMAZEPAM 15 MG PO CAPS
30.0000 mg | ORAL_CAPSULE | Freq: Every evening | ORAL | Status: DC | PRN
  Administered 2022-09-28 – 2022-09-29 (×2): 30 mg via ORAL
  Filled 2022-09-28: qty 2
  Filled 2022-09-28 (×2): qty 1
  Filled 2022-09-28: qty 2

## 2022-09-28 MED ORDER — MIRTAZAPINE 15 MG PO TABS
45.0000 mg | ORAL_TABLET | Freq: Every day | ORAL | Status: DC
  Administered 2022-09-28 – 2022-09-29 (×2): 45 mg via ORAL
  Filled 2022-09-28 (×2): qty 3

## 2022-09-28 MED ORDER — IOHEXOL 350 MG/ML SOLN
65.0000 mL | Freq: Once | INTRAVENOUS | Status: AC | PRN
  Administered 2022-09-28: 65 mL via INTRAVENOUS

## 2022-09-28 MED ORDER — SUVOREXANT 20 MG PO TABS: 1.0000 | ORAL_TABLET | Freq: Every day | ORAL | Status: DC

## 2022-09-28 MED ORDER — PANTOPRAZOLE SODIUM 40 MG IV SOLR
40.0000 mg | Freq: Two times a day (BID) | INTRAVENOUS | Status: DC
  Administered 2022-09-28 – 2022-09-30 (×5): 40 mg via INTRAVENOUS
  Filled 2022-09-28 (×4): qty 10

## 2022-09-28 MED ORDER — ONDANSETRON HCL 4 MG/2ML IJ SOLN
4.0000 mg | Freq: Three times a day (TID) | INTRAMUSCULAR | Status: AC
  Administered 2022-09-28 – 2022-09-29 (×2): 4 mg via INTRAVENOUS
  Filled 2022-09-28 (×2): qty 2

## 2022-09-28 MED ORDER — HYDROMORPHONE HCL 1 MG/ML IJ SOLN
1.0000 mg | Freq: Once | INTRAMUSCULAR | Status: AC
  Administered 2022-09-28: 1 mg via INTRAVENOUS
  Filled 2022-09-28: qty 1

## 2022-09-28 MED ORDER — HYDRALAZINE HCL 20 MG/ML IJ SOLN: 5.0000 mg | INTRAMUSCULAR | Status: DC | PRN

## 2022-09-28 MED ORDER — ONDANSETRON HCL 4 MG/2ML IJ SOLN
4.0000 mg | Freq: Once | INTRAMUSCULAR | Status: AC
  Administered 2022-09-28: 4 mg via INTRAVENOUS
  Filled 2022-09-28: qty 2

## 2022-09-28 MED ORDER — PREGABALIN 75 MG PO CAPS
75.0000 mg | ORAL_CAPSULE | Freq: Two times a day (BID) | ORAL | Status: DC
  Administered 2022-09-28 – 2022-09-30 (×3): 75 mg via ORAL
  Filled 2022-09-28 (×4): qty 1

## 2022-09-28 MED ORDER — ONDANSETRON HCL 4 MG PO TABS
4.0000 mg | ORAL_TABLET | Freq: Three times a day (TID) | ORAL | Status: AC
  Administered 2022-09-28 – 2022-09-30 (×4): 4 mg via ORAL
  Filled 2022-09-28 (×4): qty 1

## 2022-09-28 MED ORDER — AMLODIPINE BESYLATE 10 MG PO TABS
10.0000 mg | ORAL_TABLET | Freq: Every day | ORAL | Status: DC
  Administered 2022-09-29: 10 mg via ORAL
  Filled 2022-09-28 (×2): qty 1

## 2022-09-28 MED ORDER — ACETAMINOPHEN 650 MG RE SUPP: 650.0000 mg | Freq: Four times a day (QID) | RECTAL | Status: DC | PRN

## 2022-09-28 MED ORDER — PROMETHAZINE HCL 25 MG RE SUPP
25.0000 mg | Freq: Four times a day (QID) | RECTAL | Status: DC | PRN
  Administered 2022-09-28: 25 mg via RECTAL
  Filled 2022-09-28 (×2): qty 1

## 2022-09-28 MED ORDER — SODIUM CHLORIDE 0.9 % IV SOLN
2.0000 g | Freq: Once | INTRAVENOUS | Status: AC
  Administered 2022-09-28: 2 g via INTRAVENOUS
  Filled 2022-09-28: qty 20

## 2022-09-28 MED ORDER — ONDANSETRON HCL 4 MG/2ML IJ SOLN: 4.0000 mg | Freq: Four times a day (QID) | INTRAMUSCULAR | Status: DC | PRN

## 2022-09-28 MED ORDER — METRONIDAZOLE 500 MG/100ML IV SOLN
500.0000 mg | Freq: Once | INTRAVENOUS | Status: AC
  Administered 2022-09-28: 500 mg via INTRAVENOUS
  Filled 2022-09-28: qty 100

## 2022-09-28 MED ORDER — IOHEXOL 350 MG/ML SOLN: 75.0000 mL | Freq: Once | INTRAVENOUS | Status: DC | PRN

## 2022-09-28 NOTE — Consult Note (Addendum)
Consultation  Referring Provider: No ref. provider found Primary Care Physician:  [name], MD Primary Gastroenterologist:  Candelaria Stagers 2021/  Reason for Consultation:  Nausea/ Vomiting/ abdominal pain  HPI: Natalie Edwards is a 57 y.o. female, who presented to the emergency room this morning with persistent vomiting with inability to keep down p.o.'s.  She says she really has not been able to keep down anything over the past 48 hours.  No significant abdominal pain just uncomfortable, no fever or chills, no hematemesis, no diarrhea melena or hematochezia. She does have prior history of gastric ulcers documented on an EGD in 2018 done at [facility].  Also with chronic GERD and had been on twice daily Nexium long-term.  She says she ran out of medication within the past week but her symptoms predated that.  Over the past month she has not felt well with vague abdominal discomfort which she says feels completely different than prior ulcer type pain. She denies any aspirin or NSAID use, no regular EtOH use ,no THC no new medications or supplements, she is not on any weight loss medication. Postcholecystectomy and hysterectomy, also has history of hypertension and chronic kidney disease. She had continued to try taking her blood pressure medicine while sick over this past week.  Recommend that ER, hemodynamically stable WBC 14.6/hemoglobin 14.4/hematocrit 44.9 Sodium 136/potassium 4.3 BUN 44/creatinine 1.94 LFTs within normal limits UA with positive ketones, small leukocyte and 6-10 WBCs  Blood cultures done and pending [facility]-Tate 1.2 INR 1.1 X-ray negative  CT abdomen and pelvis today shows patient to be status postcholecystectomy and hysterectomy a small fat-containing supraumbilical hernia, no evidence of obstruction, no bowel wall thickening or other acute abnormality.     Past Medical History:  Diagnosis Date   Allergy    Anemia    Diverticulosis    Gall bladder stones 1993    Gastric ulcer    GERD (gastroesophageal reflux disease)    Hypertension    Renal mass     Past Surgical History:  Procedure Laterality Date   ABDOMINAL HYSTERECTOMY     CHOLECYSTECTOMY      Prior to Admission medications   Medication Sig Start Date End Date Taking? Authorizing Provider  amLODipine-benazepril (LOTREL) 10-40 MG capsule Take 1 capsule by mouth daily. 08/28/22 11/26/22  [name], MD  BELSOMRA 20 MG TABS Take 1 tablet (20 mg total) by mouth at bedtime. 09/01/22   Tomma Lightning, MD  esomeprazole (NEXIUM) 40 MG capsule Take 1 capsule (40 mg total) by mouth 2 (two) times daily. 06/09/22   Tressia Danas, MD  fluticasone (FLONASE) 50 MCG/ACT nasal spray Place 1 spray into both nostrils 2 (two) times daily as needed for allergies or rhinitis. 09/24/22   Bing Neighbors, NP  hydrochlorothiazide (HYDRODIURIL) 25 MG tablet Take 1 tablet (25 mg total) by mouth daily. 08/28/22 11/26/22  [name], MD  loratadine (CLARITIN) 10 MG tablet Take 1 tablet (10 mg total) by mouth daily as needed for allergies. 09/24/22   Bing Neighbors, NP  meloxicam (MOBIC) 15 MG tablet Take 15 mg by mouth daily. 09/20/22   [provider]  metoCLOPramide (REGLAN) 10 MG tablet Take 1 tablet (10 mg total) by mouth every 6 (six) hours. 09/24/22   Bing Neighbors, NP  mirtazapine (REMERON) 45 MG tablet Take 1 tablet (45 mg total) by mouth at bedtime. 09/01/22   [name], MD  ondansetron (ZOFRAN-ODT) 4 MG disintegrating tablet Take 1 tablet (4 mg total)  by mouth every 8 (eight) hours as needed for nausea or vomiting. 08/28/22   Zenia Resides, MD  pregabalin (LYRICA) 75 MG capsule Take by mouth. 06/23/22   [provider]  promethazine-dextromethorphan (PROMETHAZINE-DM) 6.25-15 MG/5ML syrup Take 5 mLs by mouth 3 (three) times daily as needed for cough. 09/24/22   Bing Neighbors, NP  temazepam (RESTORIL) 30 MG capsule Take 1 capsule (30 mg total) by mouth at bedtime as  needed. 09/01/22   Olalere, Minna Antis, MD  traMADol (ULTRAM) 50 MG tablet Take 1 tablet (50 mg total) by mouth every 6 (six) hours as needed (pain). 08/28/22   Zenia Resides, MD    Current Facility-Administered Medications  Medication Dose Route Frequency Provider Last Rate Last Admin   acetaminophen (TYLENOL) tablet 650 mg  650 mg Oral Q6H PRN Jonah Blue, MD       Or   acetaminophen (TYLENOL) suppository 650 mg  650 mg Rectal Q6H PRN Jonah Blue, MD       bisacodyl (DULCOLAX) EC tablet 5 mg  5 mg Oral Daily PRN Jonah Blue, MD       hydrALAZINE (APRESOLINE) injection 5 mg  5 mg Intravenous Q4H PRN Jonah Blue, MD       lactated ringers infusion   Intravenous Continuous Jonah Blue, MD       ondansetron [facility]) tablet 4 mg  4 mg Oral Q6H PRN Jonah Blue, MD       Or   ondansetron [facility]) injection 4 mg  4 mg Intravenous Q6H PRN Jonah Blue, MD       pantoprazole (PROTONIX) injection 40 mg  40 mg Intravenous Steva Colder, MD       polyethylene glycol (MIRALAX / GLYCOLAX) packet 17 g  17 g Oral Daily PRN Jonah Blue, MD       sodium chloride flush (NS) 0.9 % injection 3 mL  3 mL Intravenous Steva Colder, MD       Current Outpatient Medications  Medication Sig Dispense Refill   amLODipine-benazepril (LOTREL) 10-40 MG capsule Take 1 capsule by mouth daily. 90 capsule 0   BELSOMRA 20 MG TABS Take 1 tablet (20 mg total) by mouth at bedtime. 30 tablet 2   esomeprazole (NEXIUM) 40 MG capsule Take 1 capsule (40 mg total) by mouth 2 (two) times daily. 180 capsule 0   fluticasone (FLONASE) 50 MCG/ACT nasal spray Place 1 spray into both nostrils 2 (two) times daily as needed for allergies or rhinitis. 11.1 mL 0   hydrochlorothiazide (HYDRODIURIL) 25 MG tablet Take 1 tablet (25 mg total) by mouth daily. 90 tablet 0   loratadine (CLARITIN) 10 MG tablet Take 1 tablet (10 mg total) by mouth daily as needed for allergies. 30 tablet 0   meloxicam (MOBIC)  15 MG tablet Take 15 mg by mouth daily.     metoCLOPramide (REGLAN) 10 MG tablet Take 1 tablet (10 mg total) by mouth every 6 (six) hours. 30 tablet 0   mirtazapine (REMERON) 45 MG tablet Take 1 tablet (45 mg total) by mouth at bedtime. 90 tablet 1   ondansetron (ZOFRAN-ODT) 4 MG disintegrating tablet Take 1 tablet (4 mg total) by mouth every 8 (eight) hours as needed for nausea or vomiting. 10 tablet 0   pregabalin (LYRICA) 75 MG capsule Take by mouth.     promethazine-dextromethorphan (PROMETHAZINE-DM) 6.25-15 MG/5ML syrup Take 5 mLs by mouth 3 (three) times daily as needed for cough. 240 mL 0   temazepam (RESTORIL)  30 MG capsule Take 1 capsule (30 mg total) by mouth at bedtime as needed. 30 capsule 3   traMADol (ULTRAM) 50 MG tablet Take 1 tablet (50 mg total) by mouth every 6 (six) hours as needed (pain). 12 tablet 0    Allergies as of 09/28/2022 - Review Complete 09/28/2022  Allergen Reaction Noted   Hydroxyzine Other (See Comments) 06/07/2018   Ambien [zolpidem tartrate] Other (See Comments) 06/07/2018    Family History  Problem Relation Age of Onset   Cancer Maternal Uncle        Lung   Cancer Maternal Uncle        Lung   Cancer Maternal Uncle        Lung   Headache Neg Hx        "I don't think so"   Migraines Neg Hx        "I don't think so"    Social History   Socioeconomic History   Marital status: Single    Spouse name: Not on file   Number of children: 3   Years of education: Not on file   Highest education level: High school graduate  Occupational History   Occupation: environmental services at [employer]  Tobacco Use   Smoking status: Every Day    Current packs/day: 0.50    Average packs/day: 0.5 packs/day for 25.0 years (12.5 ttl pk-yrs)    Types: Cigarettes   Smokeless tobacco: Never   Tobacco comments:    Pt tried to quit - helps with anxiety     1 pack last patient 3 days   Vaping Use   Vaping status: Never Used  Substance and Sexual Activity    Alcohol use: Yes    Comment: occasionally - last drink was July 4, 1 shot   Drug use: No   Sexual activity: Yes    Comment: hysterectomy  Other Topics Concern   Not on file  Social History Narrative   Lives at home in an apartment. Her mother lives with her.    Right handed   Caffeine: drinks approx. 36 oz of pepsi per day. Sometimes drinks 2 cups of coffee in a day as well.    Social Determinants of Health   Financial Resource Strain: High Risk (05/22/2022)   Overall Financial Resource Strain (CARDIA)    Difficulty of Paying Living Expenses: Very hard  Food Insecurity: No Food Insecurity (06/16/2022)   Hunger Vital Sign    Worried About Running Out of Food in the Last Year: Never true    Ran Out of Food in the Last Year: Never true  Transportation Needs: No Transportation Needs (05/22/2022)   PRAPARE - Administrator, Civil Service (Medical): No    Lack of Transportation (Non-Medical): No  Physical Activity: Sufficiently Active (05/22/2022)   Exercise Vital Sign    Days of Exercise per Week: 4 days    Minutes of Exercise per Session: 70 min  Stress: Stress Concern Present (05/22/2022)   [employer] of Occupational Health - Occupational Stress Questionnaire    Feeling of Stress : Very much  Social Connections: Moderately Isolated (05/22/2022)   Social Connection and Isolation Panel [NHANES]    Frequency of Communication with Friends and Family: More than three times a week    Frequency of Social Gatherings with Friends and Family: Never    Attends Religious Services: Never    Database administrator or Organizations: Yes    Attends Banker Meetings: Never  Marital Status: Never married  Intimate Partner Violence: Not At Risk (05/22/2022)   Humiliation, Afraid, Rape, and Kick questionnaire    Fear of Current or Ex-Partner: No    Emotionally Abused: No    Physically Abused: No    Sexually Abused: No    Review of Systems: Pertinent positive and negative  review of systems were noted in the above HPI section.  All other review of systems was otherwise negative.   Physical Exam: Vital signs in last 24 hours: Temp:  [97.6 F (36.4 C)-97.7 F (36.5 C)] 97.7 F (36.5 C) (07/14 1125) Pulse Rate:  [98-113] 98 (07/14 1125) Resp:  [16-19] 16 (07/14 1125) BP: (108-141)/(70-120) 121/70 (07/14 1125) SpO2:  [98 %-99 %] 98 % (07/14 1125) Weight:  [79.8 kg] 79.8 kg (07/14 0756)   General:   Alert,  Well-developed, well-nourished, ill-appearing African-American female, pleasant and cooperative in NAD, nauseated Head:  Normocephalic and atraumatic. Eyes:  Sclera clear, no icterus.   Conjunctiva pink. Ears:  Normal auditory acuity. Nose:  No deformity, discharge,  or lesions. Mouth:  No deformity or lesions.   Neck:  Supple; no masses or thyromegaly. Lungs:  Clear throughout to auscultation.   No wheezes, crackles, or rhonchi.  Heart:  Regular rate and rhythm; no murmurs, clicks, rubs,  or gallops. Abdomen:  Soft, she has mild rather diffuse tenderness, bowel sounds are present, no palpable mass or hepatosplenomegaly no rebound Rectal: not done Msk:  Symmetrical without gross deformities. . Pulses:  Normal pulses noted. Extremities:  Without clubbing or edema. Neurologic:  Alert and  oriented x4;  grossly normal neurologically. Skin:  Intact without significant lesions or rashes.. Psych:  Alert and cooperative. Normal mood and affect.  Intake/Output from previous day: No intake/output data recorded. Intake/Output this shift: Total I/O In: 2814 [I.V.:549.1; IV Piggyback:2264.9] Out: -   Lab Results: Recent Labs    09/28/22 0758  WBC 14.6*  HGB 14.4  HCT 44.9  PLT 621*   BMET Recent Labs    09/28/22 0758  NA 136  K 4.3  CL 110  CO2 13*  GLUCOSE 127*  BUN 44*  CREATININE 1.94*  CALCIUM 9.8   LFT Recent Labs    09/28/22 0758  PROT 8.6*  ALBUMIN 4.6  AST 18  ALT 21  ALKPHOS 57  BILITOT 0.8   PT/INR Recent Labs     09/28/22 0930  LABPROT 14.5  INR 1.1   Hepatitis Panel No results for input(s): "HEPBSAG", "HCVAB", "HEPAIGM", "HEPBIGM" in the last 72 hours.   IMPRESSION:  #35 57 year old African-American female with vague abdominal discomfort over the past 3 to 4 weeks, now persistent nausea and vomiting progressive this week and unable to keep down any p.o.'s over the past 48 hours  Workup in the ER pertinent for leukocytosis and acute kidney injury  She has been afebrile and hemodynamically stable CT is unrevealing for any acute abnormality in the abdomen/pelvis  UA is abnormal question UTI Respiratory panel negative  She does have history of previous gastric ulcers but has been on twice daily PPI long-term until this past week, no aspirin or NSAID use. (patient told me she was not taking Mobic but per hospitalist note question taking regularly)  Etiology of current symptoms is not clear  Rule out viral syndrome/gastroenteritis with secondary gastroparesis Rule out possible peptic ulcer disease though should be less likely with regular PPI use  #2 hypertension #3.  Chronic kidney disease stage II #4 Status post cholecystectomy and hysterectomy  PLAN: Start IV PPI twice daily Will treat with around-the-clock Zosyn over the next 24 hours Clear liquids as tolerated Will schedule for EGD with Dr. Lavon Paganini for tomorrow 09/29/2022.  Will reassess in a.m., check pending cultures etc. GI will follow with you   Amy Esterwood  09/28/2022, 12:41 PM     Attending physician's note   I have taken history, reviewed the chart and examined the patient. I performed a substantive portion of this encounter, including complete performance of at least one of the key components, in conjunction with the APP. I agree with the Advanced Practitioner's note, impression and recommendations.   N/V/epi pain with H/O prev GU on EGD 2018 @ [place]. Neg CT AP today. No NSAIDs. Nl LFTs, s/p lap chole iin past GERD  on nexium 40BID (?Compliance) Worsening CKD with acidosis.   Plan: -IV Protonix -EGD in AM with Dr. Lavon Paganini.  I discussed the procedure, risks and benefits -If neg, further WU as outpt -Consider nephrology consultation if Cr persistently high   Edman Circle, MD [facility] [phone]

## 2022-09-28 NOTE — ED Triage Notes (Signed)
Pt states abdominal pain x 2 weeks.  Seen at [facility] and UC - Given meds without relief.  Periumbilical pain.  Diarrhea Friday, but none since.  Nausea, but no vomiting.

## 2022-09-28 NOTE — Sepsis Progress Note (Addendum)
Elink following code sepsis  0917- verified with bedside RN 1st set of blood cultures were collected prior to abx, second set was more difficult to collect.   1224 Code Sepsis monitoring discontinued due to cancellation.

## 2022-09-28 NOTE — ED Provider Notes (Addendum)
[place] EMERGENCY DEPARTMENT AT [facility] Provider Note   CSN: [id number] Arrival date & time: 09/28/22  0750     History  Chief Complaint  Patient presents with   Abdominal Pain    Natalie Edwards is a 57 y.o. female history that includes GERD/peptic ulcer disease, hypertension, diverticulosis, CKD, anxiety/depression, hyperlipidemia, prediabetes, iron deficiency anemia, cholecystectomy, hysterectomy.  Patient presents to the ER today for 1 month of abdominal pain.  Patient reports a diffuse sharp abdominal pain which radiates around her abdomen but is mostly centered periumbilical.  She reports ongoing pain for 1 month but is acutely worsened over the past 2 weeks.  She describes pain as severe and constant.  Pain is associated with nausea.  She reports she was prescribed Phenergan by urgent care earlier in the week but this has not been helpful, her last dose was yesterday.  She reports her symptoms are associated with a mild cough occasionally productive with white-yellow sputum.  She also reports vomiting but has not been able to vomit anything up because she is unable to eat.  She denies any hematemesis.  She reports diarrhea over the past few weeks as well reports occasionally is green in color.  She denies any melena or hematochezia.  She denies measured fever at home, chest pain, pleurisy, extremity swelling/color change, dysuria/hematuria or any additional concerns.  Patient denies NSAID use or EtOH use. HPI     Home Medications Prior to Admission medications   Medication Sig Start Date End Date Taking? Authorizing Provider  amLODipine-benazepril (LOTREL) 10-40 MG capsule Take 1 capsule by mouth daily. 08/28/22 11/26/22  [name], MD  BELSOMRA 20 MG TABS Take 1 tablet (20 mg total) by mouth at bedtime. 09/01/22   Tomma Lightning, MD  esomeprazole (NEXIUM) 40 MG capsule Take 1 capsule (40 mg total) by mouth 2 (two) times daily. 06/09/22   Tressia Danas, MD   fluticasone (FLONASE) 50 MCG/ACT nasal spray Place 1 spray into both nostrils 2 (two) times daily as needed for allergies or rhinitis. 09/24/22   Bing Neighbors, NP  hydrochlorothiazide (HYDRODIURIL) 25 MG tablet Take 1 tablet (25 mg total) by mouth daily. 08/28/22 11/26/22  [name], MD  loratadine (CLARITIN) 10 MG tablet Take 1 tablet (10 mg total) by mouth daily as needed for allergies. 09/24/22   Bing Neighbors, NP  meloxicam (MOBIC) 15 MG tablet Take 15 mg by mouth daily. 09/20/22   [provider]  metoCLOPramide (REGLAN) 10 MG tablet Take 1 tablet (10 mg total) by mouth every 6 (six) hours. 09/24/22   Bing Neighbors, NP  mirtazapine (REMERON) 45 MG tablet Take 1 tablet (45 mg total) by mouth at bedtime. 09/01/22   [name], MD  ondansetron (ZOFRAN-ODT) 4 MG disintegrating tablet Take 1 tablet (4 mg total) by mouth every 8 (eight) hours as needed for nausea or vomiting. 08/28/22   Zenia Resides, MD  pregabalin (LYRICA) 75 MG capsule Take by mouth. 06/23/22   [provider]  promethazine-dextromethorphan (PROMETHAZINE-DM) 6.25-15 MG/5ML syrup Take 5 mLs by mouth 3 (three) times daily as needed for cough. 09/24/22   Bing Neighbors, NP  temazepam (RESTORIL) 30 MG capsule Take 1 capsule (30 mg total) by mouth at bedtime as needed. 09/01/22   Olalere, Minna Antis, MD  traMADol (ULTRAM) 50 MG tablet Take 1 tablet (50 mg total) by mouth every 6 (six) hours as needed (pain). 08/28/22   Zenia Resides, MD  Allergies    Hydroxyzine and Ambien [zolpidem tartrate]    Review of Systems   Review of Systems Ten systems are reviewed and are negative for acute change except as noted in the HPI  Physical Exam Updated Vital Signs BP 121/70 (BP Location: Right Arm)   Pulse 98   Temp 97.7 F (36.5 C) (Oral)   Resp 16   Ht 5\' 3"  (1.6 m)   Wt 79.8 kg   SpO2 98%   BMI 31.18 kg/m  Physical Exam Constitutional:      General: She is not in acute distress.     Appearance: Normal appearance. She is well-developed. She is not ill-appearing or diaphoretic.  HENT:     Head: Normocephalic and atraumatic.  Eyes:     General: Vision grossly intact. Gaze aligned appropriately.     Pupils: Pupils are equal, round, and reactive to light.  Neck:     Trachea: Trachea and phonation normal.  Cardiovascular:     Rate and Rhythm: Regular rhythm. Tachycardia present.  Pulmonary:     Effort: Pulmonary effort is normal. No respiratory distress.     Breath sounds: Normal breath sounds.  Abdominal:     General: There is no distension.     Palpations: Abdomen is soft.     Tenderness: There is generalized abdominal tenderness and tenderness in the periumbilical area. There is no guarding or rebound.  Musculoskeletal:        General: Normal range of motion.     Cervical back: Normal range of motion.  Skin:    General: Skin is warm and dry.  Neurological:     Mental Status: She is alert.     GCS: GCS eye subscore is 4. GCS verbal subscore is 5. GCS motor subscore is 6.     Comments: Speech is clear and goal oriented, follows commands Major Cranial nerves without deficit, no facial droop Moves extremities without ataxia, coordination intact  Psychiatric:        Behavior: Behavior normal.     ED Results / Procedures / Treatments   Labs (all labs ordered are listed, but only abnormal results are displayed) Labs Reviewed  CBC WITH DIFFERENTIAL/PLATELET - Abnormal; Notable for the following components:      Result Value   WBC 14.6 (*)    RBC 5.28 (*)    RDW 16.9 (*)    Platelets 621 (*)    Neutro Abs 9.4 (*)    Monocytes Absolute 1.3 (*)    Abs Immature Granulocytes 0.08 (*)    All other components within normal limits  COMPREHENSIVE METABOLIC PANEL - Abnormal; Notable for the following components:   CO2 13 (*)    Glucose, Bld 127 (*)    BUN 44 (*)    Creatinine, Ser 1.94 (*)    Total Protein 8.6 (*)    GFR, Estimated 30 (*)    All other components  within normal limits  URINALYSIS, ROUTINE W REFLEX MICROSCOPIC - Abnormal; Notable for the following components:   Hgb urine dipstick SMALL (*)    Ketones, ur 5 (*)    Protein, ur 30 (*)    Leukocytes,Ua SMALL (*)    All other components within normal limits  RESP PANEL BY RT-PCR (RSV, FLU A&B, COVID)  RVPGX2  CULTURE, BLOOD (ROUTINE X 2)  CULTURE, BLOOD (ROUTINE X 2)  LIPASE, BLOOD  LACTIC ACID, PLASMA  LACTIC ACID, PLASMA  PROTIME-INR  APTT  HIV ANTIBODY (ROUTINE TESTING W REFLEX)  EKG EKG Interpretation Date/Time:  Sunday September 28 2022 07:58:54 EDT Ventricular Rate:  114 PR Interval:  152 QRS Duration:  97 QT Interval:  332 QTC Calculation: 456 R Axis:   88  Text Interpretation: Sinus tachycardia Low voltage with right axis deviation consider Anterior MI (old or age indeterminate) Compared to previous tracing No significant change was found Confirmed by Julien Nordmann [phone]) on 09/28/2022 10:01:50 AM  Radiology CT ABDOMEN PELVIS W CONTRAST  Result Date: 09/28/2022 CLINICAL DATA:  Abdominal pain and nausea. EXAM: CT ABDOMEN AND PELVIS WITH CONTRAST TECHNIQUE: Multidetector CT imaging of the abdomen and pelvis was performed using the standard protocol following bolus administration of intravenous contrast. RADIATION DOSE REDUCTION: This exam was performed according to the departmental dose-optimization program which includes automated exposure control, adjustment of the mA and/or kV according to patient size and/or use of iterative reconstruction technique. CONTRAST:  65mL OMNIPAQUE IOHEXOL 350 MG/ML SOLN COMPARISON:  CT abdomen pelvis dated Jul 31, 2016. FINDINGS: Lower chest: No acute abnormality. Hepatobiliary: No focal liver abnormality is seen. Status post cholecystectomy. Unchanged mildly prominent common bile duct due to reservoir effect. Pancreas: Unremarkable. No pancreatic ductal dilatation or surrounding inflammatory changes. Spleen: Normal in size without focal  abnormality. Adrenals/Urinary Tract: Unchanged 1.7 cm left adrenal adenoma. No follow-up imaging is recommended. Normal right adrenal gland. Bilateral renal simple cysts again noted. No follow-up imaging is recommended. No renal calculi or hydronephrosis. The bladder is unremarkable. Stomach/Bowel: Stomach is within normal limits. Appendix appears normal. No evidence of bowel wall thickening, distention, or inflammatory changes. Left-sided colonic diverticulosis. Vascular/Lymphatic: Aortic atherosclerosis. No enlarged abdominal or pelvic lymph nodes. Reproductive: Status post hysterectomy. No adnexal masses. Other: Small fat containing midline supraumbilical hernia has slightly increased in size. No free fluid or pneumoperitoneum. Musculoskeletal: No acute or significant osseous findings. IMPRESSION: 1. No acute intra-abdominal process. 2.  Aortic Atherosclerosis (ICD10-I70.0). Electronically Signed   By: Obie Dredge M.D.   On: 09/28/2022 10:56   DG Chest Port 1 View  Result Date: 09/28/2022 CLINICAL DATA:  57 year old female with possible sepsis. EXAM: PORTABLE CHEST 1 VIEW COMPARISON:  Chest x-ray 08/30/2022. FINDINGS: Lung volumes are normal. No consolidative airspace disease. No pleural effusions. No pneumothorax. No pulmonary nodule or mass noted. Pulmonary vasculature and the cardiomediastinal silhouette are within normal limits. IMPRESSION: No radiographic evidence of acute cardiopulmonary disease. Electronically Signed   By: Trudie Reed M.D.   On: 09/28/2022 08:43    Procedures .Critical Care  Performed by: Bill Salinas, PA-C Authorized by: Bill Salinas, PA-C   Critical care provider statement:    Critical care time (minutes):  30   Critical care was necessary to treat or prevent imminent or life-threatening deterioration of the following conditions:  Sepsis and renal failure   Critical care was time spent personally by me on the following activities:  Development of  treatment plan with patient or surrogate, discussions with consultants, evaluation of patient's response to treatment, examination of patient, ordering and review of laboratory studies, ordering and review of radiographic studies, ordering and performing treatments and interventions, pulse oximetry, re-evaluation of patient's condition, review of old charts, obtaining history from patient or surrogate and blood draw for specimens   Care discussed with: admitting provider       Medications Ordered in ED Medications  sodium chloride flush (NS) 0.9 % injection 3 mL (has no administration in time range)  lactated ringers infusion (has no administration in time range)  acetaminophen (TYLENOL) tablet 650 mg (  has no administration in time range)    Or  acetaminophen (TYLENOL) suppository 650 mg (has no administration in time range)  polyethylene glycol (MIRALAX / GLYCOLAX) packet 17 g (has no administration in time range)  bisacodyl (DULCOLAX) EC tablet 5 mg (has no administration in time range)  ondansetron (ZOFRAN) tablet 4 mg (has no administration in time range)    Or  ondansetron (ZOFRAN) injection 4 mg (has no administration in time range)  hydrALAZINE (APRESOLINE) injection 5 mg (has no administration in time range)  pantoprazole (PROTONIX) injection 40 mg (has no administration in time range)  fentaNYL (SUBLIMAZE) injection 50 mcg (50 mcg Intravenous Given 09/28/22 0815)  ondansetron (ZOFRAN) injection 4 mg (4 mg Intravenous Given 09/28/22 0816)  sodium chloride 0.9 % bolus 1,000 mL (0 mLs Intravenous Stopped 09/28/22 0858)  lactated ringers bolus 1,000 mL (0 mLs Intravenous Stopped 09/28/22 1043)  cefTRIAXone (ROCEPHIN) 2 g in sodium chloride 0.9 % 100 mL IVPB (0 g Intravenous Stopped 09/28/22 0858)  metroNIDAZOLE (FLAGYL) IVPB 500 mg (0 mg Intravenous Stopped 09/28/22 1038)  HYDROmorphone (DILAUDID) injection 1 mg (1 mg Intravenous Given 09/28/22 1042)  iohexol (OMNIPAQUE) 350 MG/ML injection  65 mL (65 mLs Intravenous Contrast Given 09/28/22 1036)  lactated ringers bolus 400 mL (400 mLs Intravenous New Bag/Given 09/28/22 1138)    ED Course/ Medical Decision Making/ A&P Clinical Course as of 09/28/22 1226  Sun Sep 28, 2022  0830 Patient tachycardic on arrival without other SIRS criteria.  Now with leukocytosis of 14.6 will activate code sepsis.  Antibiotics ordered for suspected intra-abdominal source, Rocephin/Flagyl. [BM]  6045 Patient reassessed, tachycardia improving.  Stable no acute distress.  States understanding of care plan and is agreeable. [BM]  1132 Dr. Ophelia Charter [BM]    Clinical Course User Index [BM] Elizabeth Palau                             Medical Decision Making 57 year old female history as above presented for 1 month of abdominal pain associated with nausea, nonbloody/nonbilious emesis and nonbloody diarrhea without melena.  She reports pain has worsened over the past 2 weeks.  She has had prior urgent care visits and was prescribed Phenergan without improvement.  She was also diagnosed with recent URI.  She does report a cough productive with white/yellow sputum occasionally.  On exam she is pleasant not in any acute distress and is reporting severe abdominal pain.  She is diffusely tender on exam most focally over the periumbilical area.  Cardiopulmonary exam unremarkable.  Neurovasc intact to all 4 extremities without evidence for DVT.  Will obtain abdominal pain labs along with lactic acid and COVID/influenza panel.  CT abdomen pelvis ordered.  IV fluids, pain medication and IV Zofran ordered as well.  EKG obtained.  Will monitor.  Differential includes but not limited to pancreatitis, viral illness, kidney stone disease, peptic ulcer disease, diverticulitis, appendicitis, SBO.  Amount and/or Complexity of Data Reviewed Labs: ordered.    Details: Lactic within normal limits INR, PT and APTT within normal limits COVID/influenza panel and RSV  negative Lipase within normal limits Urinalysis unremarkable CBC shows leukocytosis of 14.6 with left shift.  No anemia or thrombocytopenia. CMP shows AKI with creatinine of 1.94 and BUN of 44.  No emergent electrolyte derangement, LFT elevations or gap. Radiology: ordered.    Details: I personally reviewed patient's 1 view chest x-ray.  I do not appreciate any obvious PTX, PNA or  effusion. I personally reviewed patient's CT abdomen pelvis.  I do not appreciate any obvious free air or SBO.  Please see radiology interpretation. ECG/medicine tests:     Details: I personally reviewed patient twelve-lead EKG.  Shows sinus tachycardia with rate of 114.  I do not appreciate any obvious acute ischemic changes.  Reviewed with Dr. Jeraldine Loots. Discussion of management or test interpretation with external provider(s): Consult with hospitalist Dr. Ophelia Charter Case discussed with attending physician Dr. Jeraldine Loots  Risk Prescription drug management. Decision regarding hospitalization. Risk Details: Patient reevaluated she is resting comfortably bed no acute distress her tachycardia has resolved with IV fluids.  She has received empiric antibiotics for suspected intra-abdominal source.  No clear source found on CT ER workup at this time.  Plan will be for admission for AKI and further workup by medicine team.  Patient is in agreement with this plan.  All questions were answered.  I consulted with Dr. Ophelia Charter who accepted patient for admission.  Critical Care Total time providing critical care: 30 minutes      Patient's case discussed with Dr. Jeraldine Loots who agrees with plan.   Note: Portions of this report may have been transcribed using voice recognition software. Every effort was made to ensure accuracy; however, inadvertent computerized transcription errors may still be present.         Final Clinical Impression(s) / ED Diagnoses Final diagnoses:  AKI (acute kidney injury) (HCC)  Sepsis with acute renal  failure without septic shock, due to unspecified organism, unspecified acute renal failure type [facility])    Rx / DC Orders ED Discharge Orders     None         Elizabeth Palau 09/28/22 1225    Elizabeth Palau 09/28/22 1226    Gerhard Munch, MD 09/28/22 1249

## 2022-09-28 NOTE — H&P (Addendum)
History and Physical    Patient: Natalie Edwards ZOX:[id number] DOB: [birth year] DOA: 09/28/2022 DOS: the patient was seen and examined on 09/28/2022 PCP: [name], MD  Patient coming from: Home - lives with son; Jackey Loge: Shari Heritage, [phone]   Chief Complaint: Abdominal pain  HPI: Natalie Edwards is a 57 y.o. female with medical history significant of HTN, GERD/PUD, anxiety/depression, prediabetes, and stage 3a CKD presenting with abdominal pain.  She has a report of gastric ulcer; last EGD was in 2018 and showed 2 gastric ulcers, each <1 cm with clean bases.  She was seen at [facility] on 6/13 for nausea and headache, diagnosed with viral URI and given Tramadol and Zofran.  She was seen again in the [facility] ER on 6/15 for headache and n/v x 5 days and was discharged after unremarkable work-up.  She returned to UC on 7/10 for abdominal pain and recurrent n/v and was treated for GERD + URI with Reglan, Flonase, Loratadine, and Phenergan DM.    She reports that it started in June.  She felt nauseated, difficulty eating.  She went to [facility] and then [facility].  She was given some medication and it stopped for almost 2 weeks.  It recurred just around July 4, does not think it is food related.  It is less pain, more nausea.  She has not been able to actually vomit.  She can drink sips but she has gagging.  She is not able to eat - she tried to eat yesterday AM and had just a little eggs and couldn't go back.  Thursday she couldn't eat lunch and broke out in a sweat, had to leave work early, had 1 small episode of diarrhea.  She had diarrhea x 2 episodes on Friday.  No blood in stools.  She thinks she has lost weight.  No fevers.  No sick contacts.  She does take Mobic routinely.    ER Course:  Abdominal pain x 1 month, worse x 2 weeks.  +N/V/D. Code sepsis called due to tachycardia.  CT unremarkable.  AKI.  Given IVF, Rocephin/Flagyl.  UA ok.  Unclear cause of pain.       Review of Systems: As mentioned in the history of present  illness. All other systems reviewed and are negative. Past Medical History:  Diagnosis Date   Allergy    Anemia    Diverticulosis    Gall bladder stones 1993   Gastric ulcer    GERD (gastroesophageal reflux disease)    Hypertension    Renal mass    Past Surgical History:  Procedure Laterality Date   ABDOMINAL HYSTERECTOMY     CHOLECYSTECTOMY     Social History:  reports that she has been smoking cigarettes. She has a 12.5 pack-year smoking history. She has never used smokeless tobacco. She reports current alcohol use. She reports that she does not use drugs.  Allergies  Allergen Reactions   Hydroxyzine Other (See Comments)    Palpitations and jitteriness    Ambien [Zolpidem Tartrate] Other (See Comments)    Jitteriness, nervousness, abdominal pain    Family History  Problem Relation Age of Onset   Cancer Maternal Uncle        Lung   Cancer Maternal Uncle        Lung   Cancer Maternal Uncle        Lung   Headache Neg Hx        "I don't think so"   Migraines Neg Hx        "  I don't think so"    Prior to Admission medications   Medication Sig Start Date End Date Taking? Authorizing Provider  amLODipine-benazepril (LOTREL) 10-40 MG capsule Take 1 capsule by mouth daily. 08/28/22 11/26/22  [name], MD  BELSOMRA 20 MG TABS Take 1 tablet (20 mg total) by mouth at bedtime. 09/01/22   Tomma Lightning, MD  esomeprazole (NEXIUM) 40 MG capsule Take 1 capsule (40 mg total) by mouth 2 (two) times daily. 06/09/22   Tressia Danas, MD  fluticasone (FLONASE) 50 MCG/ACT nasal spray Place 1 spray into both nostrils 2 (two) times daily as needed for allergies or rhinitis. 09/24/22   Bing Neighbors, NP  hydrochlorothiazide (HYDRODIURIL) 25 MG tablet Take 1 tablet (25 mg total) by mouth daily. 08/28/22 11/26/22  [name], MD  loratadine (CLARITIN) 10 MG tablet Take 1 tablet (10 mg total) by mouth daily as needed for allergies. 09/24/22   Bing Neighbors, NP  metoCLOPramide  (REGLAN) 10 MG tablet Take 1 tablet (10 mg total) by mouth every 6 (six) hours. 09/24/22   Bing Neighbors, NP  mirtazapine (REMERON) 45 MG tablet Take 1 tablet (45 mg total) by mouth at bedtime. 09/01/22   [name], MD  ondansetron (ZOFRAN-ODT) 4 MG disintegrating tablet Take 1 tablet (4 mg total) by mouth every 8 (eight) hours as needed for nausea or vomiting. 08/28/22   Zenia Resides, MD  pregabalin (LYRICA) 75 MG capsule Take by mouth. 06/23/22   [provider]  promethazine-dextromethorphan (PROMETHAZINE-DM) 6.25-15 MG/5ML syrup Take 5 mLs by mouth 3 (three) times daily as needed for cough. 09/24/22   Bing Neighbors, NP  temazepam (RESTORIL) 30 MG capsule Take 1 capsule (30 mg total) by mouth at bedtime as needed. 09/01/22   Olalere, Minna Antis, MD  traMADol (ULTRAM) 50 MG tablet Take 1 tablet (50 mg total) by mouth every 6 (six) hours as needed (pain). 08/28/22   Zenia Resides, MD    Physical Exam: Vitals:   09/28/22 0800 09/28/22 0815 09/28/22 1100 09/28/22 1125  BP: (!) 141/120 108/75 126/83 121/70  Pulse: (!) 113 (!) 109 (!) 105 98  Resp: 17 19 16 16   Temp:    97.7 F (36.5 C)  TempSrc:    Oral  SpO2: 98% 99% 98% 98%  Weight:      Height:       General:  Appears calm but uncomfortable and is in NAD Eyes:  EOMI, normal lids, iris ENT:  grossly normal hearing, dry lips & tongue, moderately dry mm Neck:  no LAD, masses or thyromegaly Cardiovascular:  R with tachycardia, no m/r/g. No LE edema.  Respiratory:   CTA bilaterally with no wheezes/rales/rhonchi.  Normal respiratory effort. Abdomen:  distended, epigastric TTP with resultant nausea with gentle palpation Skin:  no rash or induration seen on limited exam Musculoskeletal:  grossly normal tone BUE/BLE, good ROM, no bony abnormality Psychiatric:  blunted mood and affect, speech fluent and appropriate, AOx3 Neurologic:  CN 2-12 grossly intact, moves all extremities in coordinated  fashion   Radiological Exams on Admission: Independently reviewed - see discussion in A/P where applicable  CT ABDOMEN PELVIS W CONTRAST  Result Date: 09/28/2022 CLINICAL DATA:  Abdominal pain and nausea. EXAM: CT ABDOMEN AND PELVIS WITH CONTRAST TECHNIQUE: Multidetector CT imaging of the abdomen and pelvis was performed using the standard protocol following bolus administration of intravenous contrast. RADIATION DOSE REDUCTION: This exam was performed according to the departmental dose-optimization program which includes automated  exposure control, adjustment of the mA and/or kV according to patient size and/or use of iterative reconstruction technique. CONTRAST:  65mL OMNIPAQUE IOHEXOL 350 MG/ML SOLN COMPARISON:  CT abdomen pelvis dated Jul 31, 2016. FINDINGS: Lower chest: No acute abnormality. Hepatobiliary: No focal liver abnormality is seen. Status post cholecystectomy. Unchanged mildly prominent common bile duct due to reservoir effect. Pancreas: Unremarkable. No pancreatic ductal dilatation or surrounding inflammatory changes. Spleen: Normal in size without focal abnormality. Adrenals/Urinary Tract: Unchanged 1.7 cm left adrenal adenoma. No follow-up imaging is recommended. Normal right adrenal gland. Bilateral renal simple cysts again noted. No follow-up imaging is recommended. No renal calculi or hydronephrosis. The bladder is unremarkable. Stomach/Bowel: Stomach is within normal limits. Appendix appears normal. No evidence of bowel wall thickening, distention, or inflammatory changes. Left-sided colonic diverticulosis. Vascular/Lymphatic: Aortic atherosclerosis. No enlarged abdominal or pelvic lymph nodes. Reproductive: Status post hysterectomy. No adnexal masses. Other: Small fat containing midline supraumbilical hernia has slightly increased in size. No free fluid or pneumoperitoneum. Musculoskeletal: No acute or significant osseous findings. IMPRESSION: 1. No acute intra-abdominal process. 2.   Aortic Atherosclerosis (ICD10-I70.0). Electronically Signed   By: Obie Dredge M.D.   On: 09/28/2022 10:56   DG Chest Port 1 View  Result Date: 09/28/2022 CLINICAL DATA:  57 year old female with possible sepsis. EXAM: PORTABLE CHEST 1 VIEW COMPARISON:  Chest x-ray 08/30/2022. FINDINGS: Lung volumes are normal. No consolidative airspace disease. No pleural effusions. No pneumothorax. No pulmonary nodule or mass noted. Pulmonary vasculature and the cardiomediastinal silhouette are within normal limits. IMPRESSION: No radiographic evidence of acute cardiopulmonary disease. Electronically Signed   By: Trudie Reed M.D.   On: 09/28/2022 08:43    EKG: Independently reviewed.  Sinus tachycardia with rate 114; nonspecific ST changes with no evidence of acute ischemia   Labs on Admission: I have personally reviewed the available labs and imaging studies at the time of the admission.  Pertinent labs:    CO2 13 Glucose 127 BUN 44/Creatinine 1.94/GFR 30; 18/1.04/>60 on 6/15 WBC 14.6 Platelets 621, down from 850 on 2/26 A1c 5.7 on 9/29   Assessment and Plan: Principal Problem:   AKI (acute kidney injury) (HCC) Active Problems:   Thrombocytosis   H/O ulcer disease   Essential hypertension   Anxiety and depression   Metabolic acidosis   Epigastric pain   Nausea    AKI -Patient without significantly baseline compromised renal function  -Current creatinine is increased at least 1.5 times compared to baseline and presumed to have occurred within the last 7 days -Likely due to prerenal secondary to severe dehydration -IVF -Follow up renal function by BMP -Avoid ACEI and NSAIDs  -Will observe overnight (on tele given persistent tachycardia)  Metabolic acidosis -Likely starvation ketosis in the setting of refractory nausea -Will hydrate and anticipate correction  Thrombocytosis -Markedly increased platelets recently, now somewhat improved but still elevated -Likely reactive in  nature -Trend CBC to ensure improvement -She was referred to oncology about a similar presentation in 2018 -If not improving, she may need hematology evaluation for possible myelodysplasia  Epigastric pain with nausea -Recurrent recent epigastric pain with nausea -She has h/o gastric ulcers in 2018 -Mobic listed on MAR -Concern for recurrent ulcer(s) -CT unremarkable -She received 1 dose of Rocephin/Flagyl empirically in the ER but will hold further abx at this time. -Will start Protonix IV BID -GI consulted -NPO for now  HTN -Continue amlodipine but hold benazepril component of Lotrel -Hold hydrochlorothiazide -Will cover with IV hydralazine  Anxiety/depression/insomnia -Continue home  meds - Belsomra, mirtazepine, temazepam, pregabalin    Advance Care Planning:   Code Status: Full Code   Consults: GI  DVT Prophylaxis: SCDs  Family Communication: None present; she declined to have me call family at the time of admission  Severity of Illness: The appropriate patient status for this patient is OBSERVATION. Observation status is judged to be reasonable and necessary in order to provide the required intensity of service to ensure the patient's safety. The patient's presenting symptoms, physical exam findings, and initial radiographic and laboratory data in the context of their medical condition is felt to place them at decreased risk for further clinical deterioration. Furthermore, it is anticipated that the patient will be medically stable for discharge from the hospital within 2 midnights of admission.   Author: Jonah Blue, MD 09/28/2022 1:28 PM  For on call review [email or web address].

## 2022-09-28 NOTE — ED Notes (Signed)
ED TO INPATIENT HANDOFF REPORT  ED Nurse Name and Phone #: Baker Pierini RN [phone]  S Name/Age/Gender Natalie Edwards 57 y.o. female Room/Bed: 024C/024C  Code Status   Code Status: Full Code  Home/SNF/Other Home Patient oriented to: self, place, time, and situation Is this baseline? Yes   Triage Complete: Triage complete  Chief Complaint AKI (acute kidney injury) (HCC) [N17.9]  Triage Note Pt states abdominal pain x 2 weeks.  Seen at [facility] and [facility] - Given meds without relief.  Periumbilical pain.  Diarrhea Friday, but none since.  Nausea, but no vomiting.   Allergies Allergies  Allergen Reactions   Hydroxyzine Other (See Comments)    Palpitations and jitteriness    Ambien [Zolpidem Tartrate] Other (See Comments)    Jitteriness, nervousness, abdominal pain    Level of Care/Admitting Diagnosis ED Disposition     ED Disposition  Admit   Condition  --   Comment  Hospital Area: [facility] [[id number]]  Level of Care: Telemetry Medical [[id number]]  May place patient in observation at [facility] or [facility] if equivalent level of care is available:: Yes  Covid Evaluation: Asymptomatic - no recent exposure (last 10 days) testing not required  Diagnosis: AKI (acute kidney injury) [facility]) [[id number]]  Admitting Physician: Jonah Blue [[id number]]  Attending Physician: Jonah Blue [[id number]]          B Medical/Surgery History Past Medical History:  Diagnosis Date   Allergy    Anemia    Diverticulosis    Gall bladder stones 1993   Gastric ulcer    GERD (gastroesophageal reflux disease)    Hypertension    Renal mass    Past Surgical History:  Procedure Laterality Date   ABDOMINAL HYSTERECTOMY     CHOLECYSTECTOMY       A IV Location/Drains/Wounds Patient Lines/Drains/Airways Status     Active Line/Drains/Airways     Name Placement date Placement time Site Days   Peripheral IV 09/28/22 20 G Anterior;Left;Proximal Forearm 09/28/22  0805  Forearm  less  than 1            Intake/Output Last 24 hours  Intake/Output Summary (Last 24 hours) at 09/28/2022 1414 Last data filed at 09/28/2022 1230 Gross per 24 hour  Intake 2814.03 ml  Output --  Net 2814.03 ml    Labs/Imaging Results for orders placed or performed during the hospital encounter of 09/28/22 (from the past 48 hour(s))  CBC with Differential     Status: Abnormal   Collection Time: 09/28/22  7:58 AM  Result Value Ref Range   WBC 14.6 (H) 4.0 - 10.5 K/uL   RBC 5.28 (H) 3.87 - 5.11 MIL/uL   Hemoglobin 14.4 12.0 - 15.0 g/dL   HCT 84.1 32.4 - 40.1 %   MCV 85.0 80.0 - 100.0 fL   MCH 27.3 26.0 - 34.0 pg   MCHC 32.1 30.0 - 36.0 g/dL   RDW 02.7 (H) 25.3 - 66.4 %   Platelets 621 (H) 150 - 400 K/uL   nRBC 0.0 0.0 - 0.2 %   Neutrophils Relative % 63 %   Neutro Abs 9.4 (H) 1.7 - 7.7 K/uL   Lymphocytes Relative 26 %   Lymphs Abs 3.8 0.7 - 4.0 K/uL   Monocytes Relative 9 %   Monocytes Absolute 1.3 (H) 0.1 - 1.0 K/uL   Eosinophils Relative 1 %   Eosinophils Absolute 0.1 0.0 - 0.5 K/uL   Basophils Relative 0 %   Basophils Absolute 0.0 0.0 -  0.1 K/uL   Immature Granulocytes 1 %   Abs Immature Granulocytes 0.08 (H) 0.00 - 0.07 K/uL    Comment: Performed at [facility], [address] [place], [place] [address]  Comprehensive metabolic panel     Status: Abnormal   Collection Time: 09/28/22  7:58 AM  Result Value Ref Range   Sodium 136 135 - 145 mmol/L   Potassium 4.3 3.5 - 5.1 mmol/L   Chloride 110 98 - 111 mmol/L   CO2 13 (L) 22 - 32 mmol/L   Glucose, Bld 127 (H) 70 - 99 mg/dL    Comment: Glucose reference range applies only to samples taken after fasting for at least 8 hours.   BUN 44 (H) 6 - 20 mg/dL   Creatinine, Ser 6.04 (H) 0.44 - 1.00 mg/dL   Calcium 9.8 8.9 - 54.0 mg/dL   Total Protein 8.6 (H) 6.5 - 8.1 g/dL   Albumin 4.6 3.5 - 5.0 g/dL   AST 18 15 - 41 U/L   ALT 21 0 - 44 U/L   Alkaline Phosphatase 57 38 - 126 U/L   Total Bilirubin 0.8 0.3 - 1.2 mg/dL    GFR, Estimated 30 (L) >60 mL/min    Comment: (NOTE) Calculated using the CKD-EPI Creatinine Equation (2021)    Anion gap 13 5 - 15    Comment: Performed at [facility], [address], [place], [place] [address]  Lipase, blood     Status: None   Collection Time: 09/28/22  7:58 AM  Result Value Ref Range   Lipase 29 11 - 51 U/L    Comment: Performed at [facility], [address], [place], [place] [address]  Resp panel by RT-PCR (RSV, Flu A&B, Covid) Anterior Nasal Swab     Status: None   Collection Time: 09/28/22  8:04 AM   Specimen: Anterior Nasal Swab  Result Value Ref Range   SARS Coronavirus 2 by RT PCR NEGATIVE NEGATIVE   Influenza A by PCR NEGATIVE NEGATIVE   Influenza B by PCR NEGATIVE NEGATIVE    Comment: (NOTE) The Xpert Xpress SARS-CoV-2/FLU/RSV plus assay is intended as an aid in the diagnosis of influenza from Nasopharyngeal swab specimens and should not be used as a sole basis for treatment. Nasal washings and aspirates are unacceptable for Xpert Xpress SARS-CoV-2/FLU/RSV testing.  Fact Sheet for Patients: BloggerCourse.com  Fact Sheet for Healthcare Providers: SeriousBroker.it  This test is not yet approved or cleared by the [place] FDA and has been authorized for detection and/or diagnosis of SARS-CoV-2 by FDA under an Emergency Use Authorization (EUA). This EUA will remain in effect (meaning this test can be used) for the duration of the COVID-19 declaration under Section 564(b)(1) of the Act, 21 U.S.C. section 360bbb-3(b)(1), unless the authorization is terminated or revoked.     Resp Syncytial Virus by PCR NEGATIVE NEGATIVE    Comment: (NOTE) Fact Sheet for Patients: BloggerCourse.com  Fact Sheet for Healthcare Providers: SeriousBroker.it  This test is not yet approved or cleared by the [place] FDA and has been authorized for  detection and/or diagnosis of SARS-CoV-2 by FDA under an Emergency Use Authorization (EUA). This EUA will remain in effect (meaning this test can be used) for the duration of the COVID-19 declaration under Section 564(b)(1) of the Act, 21 U.S.C. section 360bbb-3(b)(1), unless the authorization is terminated or revoked.  Performed at [facility], [address], [place], [place] [address]   Lactic acid,  plasma     Status: None   Collection Time: 09/28/22  8:15 AM  Result Value Ref Range   Lactic Acid, Venous 1.2 0.5 - 1.9 mmol/L    Comment: Performed at [facility], [address], [place], [place] [address]  Lactic acid, plasma     Status: None   Collection Time: 09/28/22  9:30 AM  Result Value Ref Range   Lactic Acid, Venous 1.3 0.5 - 1.9 mmol/L    Comment: Performed at [facility], [address], [place], [place] [address]  Protime-INR     Status: None   Collection Time: 09/28/22  9:30 AM  Result Value Ref Range   Prothrombin Time 14.5 11.4 - 15.2 seconds   INR 1.1 0.8 - 1.2    Comment: (NOTE) INR goal varies based on device and disease states. Performed at [facility], [address], [place], [place] [address]   APTT     Status: None   Collection Time: 09/28/22  9:30 AM  Result Value Ref Range   aPTT 27 24 - 36 seconds    Comment: Performed at [facility], [address], [place], [place] [address]  Urinalysis, Routine w reflex microscopic -Urine, Clean Catch     Status: Abnormal   Collection Time: 09/28/22  9:44 AM  Result Value Ref Range   Color, Urine YELLOW YELLOW   APPearance CLEAR CLEAR   Specific Gravity, Urine 1.015 1.005 - 1.030   pH 5.0 5.0 - 8.0   Glucose, UA NEGATIVE NEGATIVE mg/dL   Hgb urine dipstick SMALL (A) NEGATIVE   Bilirubin Urine NEGATIVE NEGATIVE   Ketones, ur 5 (A) NEGATIVE mg/dL   Protein, ur 30 (A) NEGATIVE mg/dL   Nitrite NEGATIVE NEGATIVE   Leukocytes,Ua SMALL (A) NEGATIVE   RBC / HPF 0-5 0 - 5 RBC/hpf    WBC, UA 6-10 0 - 5 WBC/hpf   Bacteria, UA NONE SEEN NONE SEEN   Squamous Epithelial / HPF 0-5 0 - 5 /HPF   Mucus PRESENT    Hyaline Casts, UA PRESENT     Comment: Performed at [facility], [address], [place], [place] [address]   CT ABDOMEN PELVIS W CONTRAST  Result Date: 09/28/2022 CLINICAL DATA:  Abdominal pain and nausea. EXAM: CT ABDOMEN AND PELVIS WITH CONTRAST TECHNIQUE: Multidetector CT imaging of the abdomen and pelvis was performed using the standard protocol following bolus administration of intravenous contrast. RADIATION DOSE REDUCTION: This exam was performed according to the departmental dose-optimization program which includes automated exposure control, adjustment of the mA and/or kV according to patient size and/or use of iterative reconstruction technique. CONTRAST:  65mL OMNIPAQUE IOHEXOL 350 MG/ML SOLN COMPARISON:  CT abdomen pelvis dated Jul 31, 2016. FINDINGS: Lower chest: No acute abnormality. Hepatobiliary: No focal liver abnormality is seen. Status post cholecystectomy. Unchanged mildly prominent common bile duct due to reservoir effect. Pancreas: Unremarkable. No pancreatic ductal dilatation or surrounding inflammatory changes. Spleen: Normal in size without focal abnormality. Adrenals/Urinary Tract: Unchanged 1.7 cm left adrenal adenoma. No follow-up imaging is recommended. Normal right adrenal gland. Bilateral renal simple cysts again noted. No follow-up imaging is recommended. No renal calculi or hydronephrosis. The bladder is unremarkable. Stomach/Bowel: Stomach is within normal limits. Appendix appears normal. No evidence of bowel wall thickening, distention, or inflammatory changes. Left-sided colonic diverticulosis. Vascular/Lymphatic: Aortic atherosclerosis. No enlarged abdominal or pelvic lymph nodes. Reproductive: Status post hysterectomy. No adnexal masses. Other: Small fat containing midline  supraumbilical hernia has slightly increased in size. No free  fluid or pneumoperitoneum. Musculoskeletal: No acute or significant osseous findings. IMPRESSION: 1. No acute intra-abdominal process. 2.  Aortic Atherosclerosis (ICD10-I70.0). Electronically Signed   By: Obie Dredge M.D.   On: 09/28/2022 10:56   DG Chest Port 1 View  Result Date: 09/28/2022 CLINICAL DATA:  57 year old female with possible sepsis. EXAM: PORTABLE CHEST 1 VIEW COMPARISON:  Chest x-ray 08/30/2022. FINDINGS: Lung volumes are normal. No consolidative airspace disease. No pleural effusions. No pneumothorax. No pulmonary nodule or mass noted. Pulmonary vasculature and the cardiomediastinal silhouette are within normal limits. IMPRESSION: No radiographic evidence of acute cardiopulmonary disease. Electronically Signed   By: Trudie Reed M.D.   On: 09/28/2022 08:43    Pending Labs Unresulted Labs (From admission, onward)     Start     Ordered   09/29/22 0500  Comprehensive metabolic panel  Tomorrow morning,   R        09/28/22 1224   09/29/22 0500  CBC  Tomorrow morning,   R        09/28/22 1224   09/28/22 1224  HIV Antibody (routine testing w rflx)  (HIV Antibody (Routine testing w reflex) panel)  Once,   R        09/28/22 1224   09/28/22 0833  Blood Culture (routine x 2)  (Undifferentiated presentation (screening labs and basic nursing orders))  BLOOD CULTURE X 2,   STAT      09/28/22 0832            Vitals/Pain Today's Vitals   09/28/22 1125 09/28/22 1139 09/28/22 1334 09/28/22 1400  BP: 121/70   105/61  Pulse: 98   66  Resp: 16   17  Temp: 97.7 F (36.5 C)  97.7 F (36.5 C)   TempSrc: Oral  Oral   SpO2: 98%   98%  Weight:      Height:      PainSc:  8       Isolation Precautions No active isolations  Medications Medications  sodium chloride flush (NS) 0.9 % injection 3 mL (3 mLs Intravenous Given 09/28/22 1310)  lactated ringers infusion ( Intravenous New Bag/Given 09/28/22 1316)  acetaminophen (TYLENOL) tablet 650 mg (has no administration in time  range)    Or  acetaminophen (TYLENOL) suppository 650 mg (has no administration in time range)  polyethylene glycol (MIRALAX / GLYCOLAX) packet 17 g (has no administration in time range)  bisacodyl (DULCOLAX) EC tablet 5 mg (has no administration in time range)  hydrALAZINE (APRESOLINE) injection 5 mg (has no administration in time range)  pantoprazole (PROTONIX) injection 40 mg (40 mg Intravenous Given 09/28/22 1309)  Suvorexant TABS 20 mg (has no administration in time range)  mirtazapine (REMERON) tablet 45 mg (has no administration in time range)  pregabalin (LYRICA) capsule 75 mg (has no administration in time range)  temazepam (RESTORIL) capsule 30 mg (has no administration in time range)  amLODipine (NORVASC) tablet 10 mg (has no administration in time range)  ondansetron (ZOFRAN) tablet 4 mg (has no administration in time range)    Or  ondansetron (ZOFRAN) injection 4 mg (has no administration in time range)  fentaNYL (SUBLIMAZE) injection 50 mcg (50 mcg Intravenous Given 09/28/22 0815)  ondansetron (ZOFRAN) injection 4 mg (4 mg Intravenous Given 09/28/22 0816)  sodium chloride 0.9 % bolus 1,000 mL (0 mLs Intravenous Stopped 09/28/22 0858)  lactated ringers bolus 1,000 mL (0 mLs Intravenous Stopped 09/28/22 1043)  cefTRIAXone (ROCEPHIN)  2 g in sodium chloride 0.9 % 100 mL IVPB (0 g Intravenous Stopped 09/28/22 0858)  metroNIDAZOLE (FLAGYL) IVPB 500 mg (0 mg Intravenous Stopped 09/28/22 1038)  HYDROmorphone (DILAUDID) injection 1 mg (1 mg Intravenous Given 09/28/22 1042)  iohexol (OMNIPAQUE) 350 MG/ML injection 65 mL (65 mLs Intravenous Contrast Given 09/28/22 1036)  lactated ringers bolus 400 mL (0 mLs Intravenous Stopped 09/28/22 1230)    Mobility walks     Focused Assessments   R Recommendations: See Admitting Provider Note  Report given to:   Additional Notes: Pt alert and oriented pt ambulates to the bathroom with a stand by assist.

## 2022-09-28 NOTE — H&P (View-Only) (Signed)
 Consultation  Referring Provider: No ref. provider found Primary Care Physician:  [name], MD Primary Gastroenterologist:  Candelaria Stagers 2021/  Reason for Consultation:  Nausea/ Vomiting/ abdominal pain  HPI: Natalie Edwards is a 57 y.o. female, who presented to the emergency room this morning with persistent vomiting with inability to keep down p.o.'s.  She says she really has not been able to keep down anything over the past 48 hours.  No significant abdominal pain just uncomfortable, no fever or chills, no hematemesis, no diarrhea melena or hematochezia. She does have prior history of gastric ulcers documented on an EGD in 2018 done at [facility].  Also with chronic GERD and had been on twice daily Nexium long-term.  She says she ran out of medication within the past week but her symptoms predated that.  Over the past month she has not felt well with vague abdominal discomfort which she says feels completely different than prior ulcer type pain. She denies any aspirin or NSAID use, no regular EtOH use ,no THC no new medications or supplements, she is not on any weight loss medication. Postcholecystectomy and hysterectomy, also has history of hypertension and chronic kidney disease. She had continued to try taking her blood pressure medicine while sick over this past week.  Recommend that ER, hemodynamically stable WBC 14.6/hemoglobin 14.4/hematocrit 44.9 Sodium 136/potassium 4.3 BUN 44/creatinine 1.94 LFTs within normal limits UA with positive ketones, small leukocyte and 6-10 WBCs  Blood cultures done and pending AK-Tate 1.2 INR 1.1 X-ray negative  CT abdomen and pelvis today shows patient to be status postcholecystectomy and hysterectomy a small fat-containing supraumbilical hernia, no evidence of obstruction, no bowel wall thickening or other acute abnormality.     Past Medical History:  Diagnosis Date   Allergy    Anemia    Diverticulosis    Gall bladder stones 1993    Gastric ulcer    GERD (gastroesophageal reflux disease)    Hypertension    Renal mass     Past Surgical History:  Procedure Laterality Date   ABDOMINAL HYSTERECTOMY     CHOLECYSTECTOMY      Prior to Admission medications   Medication Sig Start Date End Date Taking? Authorizing Provider  amLODipine-benazepril (LOTREL) 10-40 MG capsule Take 1 capsule by mouth daily. 08/28/22 11/26/22  [name], MD  BELSOMRA 20 MG TABS Take 1 tablet (20 mg total) by mouth at bedtime. 09/01/22   Tomma Lightning, MD  esomeprazole (NEXIUM) 40 MG capsule Take 1 capsule (40 mg total) by mouth 2 (two) times daily. 06/09/22   Tressia Danas, MD  fluticasone (FLONASE) 50 MCG/ACT nasal spray Place 1 spray into both nostrils 2 (two) times daily as needed for allergies or rhinitis. 09/24/22   Bing Neighbors, NP  hydrochlorothiazide (HYDRODIURIL) 25 MG tablet Take 1 tablet (25 mg total) by mouth daily. 08/28/22 11/26/22  [name], MD  loratadine (CLARITIN) 10 MG tablet Take 1 tablet (10 mg total) by mouth daily as needed for allergies. 09/24/22   Bing Neighbors, NP  meloxicam (MOBIC) 15 MG tablet Take 15 mg by mouth daily. 09/20/22   [provider]  metoCLOPramide (REGLAN) 10 MG tablet Take 1 tablet (10 mg total) by mouth every 6 (six) hours. 09/24/22   Bing Neighbors, NP  mirtazapine (REMERON) 45 MG tablet Take 1 tablet (45 mg total) by mouth at bedtime. 09/01/22   [name], MD  ondansetron (ZOFRAN-ODT) 4 MG disintegrating tablet Take 1 tablet (4 mg total)  by mouth every 8 (eight) hours as needed for nausea or vomiting. 08/28/22   Zenia Resides, MD  pregabalin (LYRICA) 75 MG capsule Take by mouth. 06/23/22   [provider]  promethazine-dextromethorphan (PROMETHAZINE-DM) 6.25-15 MG/5ML syrup Take 5 mLs by mouth 3 (three) times daily as needed for cough. 09/24/22   Bing Neighbors, NP  temazepam (RESTORIL) 30 MG capsule Take 1 capsule (30 mg total) by mouth at bedtime as  needed. 09/01/22   Olalere, Minna Antis, MD  traMADol (ULTRAM) 50 MG tablet Take 1 tablet (50 mg total) by mouth every 6 (six) hours as needed (pain). 08/28/22   Zenia Resides, MD    Current Facility-Administered Medications  Medication Dose Route Frequency Provider Last Rate Last Admin   acetaminophen (TYLENOL) tablet 650 mg  650 mg Oral Q6H PRN Jonah Blue, MD       Or   acetaminophen (TYLENOL) suppository 650 mg  650 mg Rectal Q6H PRN Jonah Blue, MD       bisacodyl (DULCOLAX) EC tablet 5 mg  5 mg Oral Daily PRN Jonah Blue, MD       hydrALAZINE (APRESOLINE) injection 5 mg  5 mg Intravenous Q4H PRN Jonah Blue, MD       lactated ringers infusion   Intravenous Continuous Jonah Blue, MD       ondansetron [facility]) tablet 4 mg  4 mg Oral Q6H PRN Jonah Blue, MD       Or   ondansetron [facility]) injection 4 mg  4 mg Intravenous Q6H PRN Jonah Blue, MD       pantoprazole (PROTONIX) injection 40 mg  40 mg Intravenous Steva Colder, MD       polyethylene glycol (MIRALAX / GLYCOLAX) packet 17 g  17 g Oral Daily PRN Jonah Blue, MD       sodium chloride flush (NS) 0.9 % injection 3 mL  3 mL Intravenous Steva Colder, MD       Current Outpatient Medications  Medication Sig Dispense Refill   amLODipine-benazepril (LOTREL) 10-40 MG capsule Take 1 capsule by mouth daily. 90 capsule 0   BELSOMRA 20 MG TABS Take 1 tablet (20 mg total) by mouth at bedtime. 30 tablet 2   esomeprazole (NEXIUM) 40 MG capsule Take 1 capsule (40 mg total) by mouth 2 (two) times daily. 180 capsule 0   fluticasone (FLONASE) 50 MCG/ACT nasal spray Place 1 spray into both nostrils 2 (two) times daily as needed for allergies or rhinitis. 11.1 mL 0   hydrochlorothiazide (HYDRODIURIL) 25 MG tablet Take 1 tablet (25 mg total) by mouth daily. 90 tablet 0   loratadine (CLARITIN) 10 MG tablet Take 1 tablet (10 mg total) by mouth daily as needed for allergies. 30 tablet 0   meloxicam (MOBIC)  15 MG tablet Take 15 mg by mouth daily.     metoCLOPramide (REGLAN) 10 MG tablet Take 1 tablet (10 mg total) by mouth every 6 (six) hours. 30 tablet 0   mirtazapine (REMERON) 45 MG tablet Take 1 tablet (45 mg total) by mouth at bedtime. 90 tablet 1   ondansetron (ZOFRAN-ODT) 4 MG disintegrating tablet Take 1 tablet (4 mg total) by mouth every 8 (eight) hours as needed for nausea or vomiting. 10 tablet 0   pregabalin (LYRICA) 75 MG capsule Take by mouth.     promethazine-dextromethorphan (PROMETHAZINE-DM) 6.25-15 MG/5ML syrup Take 5 mLs by mouth 3 (three) times daily as needed for cough. 240 mL 0   temazepam (RESTORIL)  30 MG capsule Take 1 capsule (30 mg total) by mouth at bedtime as needed. 30 capsule 3   traMADol (ULTRAM) 50 MG tablet Take 1 tablet (50 mg total) by mouth every 6 (six) hours as needed (pain). 12 tablet 0    Allergies as of 09/28/2022 - Review Complete 09/28/2022  Allergen Reaction Noted   Hydroxyzine Other (See Comments) 06/07/2018   Ambien [zolpidem tartrate] Other (See Comments) 06/07/2018    Family History  Problem Relation Age of Onset   Cancer Maternal Uncle        Lung   Cancer Maternal Uncle        Lung   Cancer Maternal Uncle        Lung   Headache Neg Hx        "I don't think so"   Migraines Neg Hx        "I don't think so"    Social History   Socioeconomic History   Marital status: Single    Spouse name: Not on file   Number of children: 3   Years of education: Not on file   Highest education level: High school graduate  Occupational History   Occupation: environmental services at [employer]  Tobacco Use   Smoking status: Every Day    Current packs/day: 0.50    Average packs/day: 0.5 packs/day for 25.0 years (12.5 ttl pk-yrs)    Types: Cigarettes   Smokeless tobacco: Never   Tobacco comments:    Pt tried to quit - helps with anxiety     1 pack last patient 3 days   Vaping Use   Vaping status: Never Used  Substance and Sexual Activity    Alcohol use: Yes    Comment: occasionally - last drink was July 4, 1 shot   Drug use: No   Sexual activity: Yes    Comment: hysterectomy  Other Topics Concern   Not on file  Social History Narrative   Lives at home in an apartment. Her mother lives with her.    Right handed   Caffeine: drinks approx. 36 oz of pepsi per day. Sometimes drinks 2 cups of coffee in a day as well.    Social Determinants of Health   Financial Resource Strain: High Risk (05/22/2022)   Overall Financial Resource Strain (CARDIA)    Difficulty of Paying Living Expenses: Very hard  Food Insecurity: No Food Insecurity (06/16/2022)   Hunger Vital Sign    Worried About Running Out of Food in the Last Year: Never true    Ran Out of Food in the Last Year: Never true  Transportation Needs: No Transportation Needs (05/22/2022)   PRAPARE - Administrator, Civil Service (Medical): No    Lack of Transportation (Non-Medical): No  Physical Activity: Sufficiently Active (05/22/2022)   Exercise Vital Sign    Days of Exercise per Week: 4 days    Minutes of Exercise per Session: 70 min  Stress: Stress Concern Present (05/22/2022)   [employer] of Occupational Health - Occupational Stress Questionnaire    Feeling of Stress : Very much  Social Connections: Moderately Isolated (05/22/2022)   Social Connection and Isolation Panel [NHANES]    Frequency of Communication with Friends and Family: More than three times a week    Frequency of Social Gatherings with Friends and Family: Never    Attends Religious Services: Never    Database administrator or Organizations: Yes    Attends Banker Meetings: Never  Marital Status: Never married  Intimate Partner Violence: Not At Risk (05/22/2022)   Humiliation, Afraid, Rape, and Kick questionnaire    Fear of Current or Ex-Partner: No    Emotionally Abused: No    Physically Abused: No    Sexually Abused: No    Review of Systems: Pertinent positive and negative  review of systems were noted in the above HPI section.  All other review of systems was otherwise negative.   Physical Exam: Vital signs in last 24 hours: Temp:  [97.6 F (36.4 C)-97.7 F (36.5 C)] 97.7 F (36.5 C) (07/14 1125) Pulse Rate:  [98-113] 98 (07/14 1125) Resp:  [16-19] 16 (07/14 1125) BP: (108-141)/(70-120) 121/70 (07/14 1125) SpO2:  [98 %-99 %] 98 % (07/14 1125) Weight:  [79.8 kg] 79.8 kg (07/14 0756)   General:   Alert,  Well-developed, well-nourished, ill-appearing African-American female, pleasant and cooperative in NAD, nauseated Head:  Normocephalic and atraumatic. Eyes:  Sclera clear, no icterus.   Conjunctiva pink. Ears:  Normal auditory acuity. Nose:  No deformity, discharge,  or lesions. Mouth:  No deformity or lesions.   Neck:  Supple; no masses or thyromegaly. Lungs:  Clear throughout to auscultation.   No wheezes, crackles, or rhonchi.  Heart:  Regular rate and rhythm; no murmurs, clicks, rubs,  or gallops. Abdomen:  Soft, she has mild rather diffuse tenderness, bowel sounds are present, no palpable mass or hepatosplenomegaly no rebound Rectal: not done Msk:  Symmetrical without gross deformities. . Pulses:  Normal pulses noted. Extremities:  Without clubbing or edema. Neurologic:  Alert and  oriented x4;  grossly normal neurologically. Skin:  Intact without significant lesions or rashes.. Psych:  Alert and cooperative. Normal mood and affect.  Intake/Output from previous day: No intake/output data recorded. Intake/Output this shift: Total I/O In: 2814 [I.V.:549.1; IV Piggyback:2264.9] Out: -   Lab Results: Recent Labs    09/28/22 0758  WBC 14.6*  HGB 14.4  HCT 44.9  PLT 621*   BMET Recent Labs    09/28/22 0758  NA 136  K 4.3  CL 110  CO2 13*  GLUCOSE 127*  BUN 44*  CREATININE 1.94*  CALCIUM 9.8   LFT Recent Labs    09/28/22 0758  PROT 8.6*  ALBUMIN 4.6  AST 18  ALT 21  ALKPHOS 57  BILITOT 0.8   PT/INR Recent Labs     09/28/22 0930  LABPROT 14.5  INR 1.1   Hepatitis Panel No results for input(s): "HEPBSAG", "HCVAB", "HEPAIGM", "HEPBIGM" in the last 72 hours.   IMPRESSION:  #35 57 year old African-American female with vague abdominal discomfort over the past 3 to 4 weeks, now persistent nausea and vomiting progressive this week and unable to keep down any p.o.'s over the past 48 hours  Workup in the ER pertinent for leukocytosis and acute kidney injury  She has been afebrile and hemodynamically stable CT is unrevealing for any acute abnormality in the abdomen/pelvis  UA is abnormal question UTI Respiratory panel negative  She does have history of previous gastric ulcers but has been on twice daily PPI long-term until this past week, no aspirin or NSAID use. (patient told me she was not taking Mobic but per hospitalist note question taking regularly)  Etiology of current symptoms is not clear  Rule out viral syndrome/gastroenteritis with secondary gastroparesis Rule out possible peptic ulcer disease though should be less likely with regular PPI use  #2 hypertension #3.  Chronic kidney disease stage II #4 Status post cholecystectomy and hysterectomy  PLAN: Start IV PPI twice daily Will treat with around-the-clock Zosyn over the next 24 hours Clear liquids as tolerated Will schedule for EGD with Dr. Lavon Paganini for tomorrow 09/29/2022.  Will reassess in a.m., check pending cultures etc. GI will follow with you   Amy Esterwood  09/28/2022, 12:41 PM     Attending physician's note   I have taken history, reviewed the chart and examined the patient. I performed a substantive portion of this encounter, including complete performance of at least one of the key components, in conjunction with the APP. I agree with the Advanced Practitioner's note, impression and recommendations.   N/V/epi pain with H/O prev GU on EGD 2018 @ [place]. Neg CT AP today. No NSAIDs. Nl LFTs, s/p lap chole iin past GERD  on nexium 40BID (?Compliance) Worsening CKD with acidosis.   Plan: -IV Protonix -EGD in AM with Dr. Lavon Paganini.  I discussed the procedure, risks and benefits -If neg, further WU as outpt -Consider nephrology consultation if Cr persistently high   Edman Circle, MD [facility] [phone]

## 2022-09-29 ENCOUNTER — Encounter (HOSPITAL_COMMUNITY)
Admission: EM | Disposition: A | Discharge status: Home / Self Care | Payer: Self-pay | Source: Home / Self Care | Attending: Internal Medicine

## 2022-09-29 ENCOUNTER — Observation Stay (HOSPITAL_BASED_OUTPATIENT_CLINIC_OR_DEPARTMENT_OTHER): Payer: 59 | Admitting: Anesthesiology

## 2022-09-29 ENCOUNTER — Encounter (HOSPITAL_COMMUNITY): Payer: Self-pay | Admitting: Internal Medicine

## 2022-09-29 ENCOUNTER — Observation Stay (HOSPITAL_COMMUNITY): Payer: 59 | Admitting: Anesthesiology

## 2022-09-29 DIAGNOSIS — K2970 Gastritis, unspecified, without bleeding: Secondary | ICD-10-CM

## 2022-09-29 DIAGNOSIS — K921 Melena: Secondary | ICD-10-CM

## 2022-09-29 DIAGNOSIS — N179 Acute kidney failure, unspecified: Secondary | ICD-10-CM | POA: Diagnosis not present

## 2022-09-29 DIAGNOSIS — K259 Gastric ulcer, unspecified as acute or chronic, without hemorrhage or perforation: Secondary | ICD-10-CM | POA: Diagnosis not present

## 2022-09-29 DIAGNOSIS — K3189 Other diseases of stomach and duodenum: Secondary | ICD-10-CM

## 2022-09-29 DIAGNOSIS — D509 Iron deficiency anemia, unspecified: Secondary | ICD-10-CM

## 2022-09-29 DIAGNOSIS — F419 Anxiety disorder, unspecified: Secondary | ICD-10-CM | POA: Diagnosis not present

## 2022-09-29 DIAGNOSIS — R1013 Epigastric pain: Secondary | ICD-10-CM | POA: Diagnosis not present

## 2022-09-29 DIAGNOSIS — I10 Essential (primary) hypertension: Secondary | ICD-10-CM | POA: Diagnosis not present

## 2022-09-29 HISTORY — PX: ESOPHAGOGASTRODUODENOSCOPY (EGD) WITH PROPOFOL: SHX5813

## 2022-09-29 HISTORY — PX: BIOPSY: SHX5522

## 2022-09-29 LAB — COMPREHENSIVE METABOLIC PANEL
ALT: 17 U/L (ref 0–44)
AST: 14 U/L — ABNORMAL LOW (ref 15–41)
Albumin: 3.6 g/dL (ref 3.5–5.0)
Alkaline Phosphatase: 42 U/L (ref 38–126)
Anion gap: 14 (ref 5–15)
BUN: 19 mg/dL (ref 6–20)
CO2: 19 mmol/L — ABNORMAL LOW (ref 22–32)
Calcium: 9.4 mg/dL (ref 8.9–10.3)
Chloride: 107 mmol/L (ref 98–111)
Creatinine, Ser: 1.2 mg/dL — ABNORMAL HIGH (ref 0.44–1.00)
GFR, Estimated: 53 mL/min — ABNORMAL LOW (ref >60)
Glucose, Bld: 97 mg/dL (ref 70–99)
Potassium: 4 mmol/L (ref 3.5–5.1)
Sodium: 140 mmol/L (ref 135–145)
Total Bilirubin: 0.7 mg/dL (ref 0.3–1.2)
Total Protein: 6.5 g/dL (ref 6.5–8.1)

## 2022-09-29 LAB — CBC
HCT: 35.5 % — ABNORMAL LOW (ref 36.0–46.0)
Hemoglobin: 11.5 g/dL — ABNORMAL LOW (ref 12.0–15.0)
MCH: 27.1 pg (ref 26.0–34.0)
MCHC: 32.4 g/dL (ref 30.0–36.0)
MCV: 83.7 fL (ref 80.0–100.0)
Platelets: 489 10*3/uL — ABNORMAL HIGH (ref 150–400)
RBC: 4.24 MIL/uL (ref 3.87–5.11)
RDW: 16.9 % — ABNORMAL HIGH (ref 11.5–15.5)
WBC: 13.4 10*3/uL — ABNORMAL HIGH (ref 4.0–10.5)
nRBC: 0 % (ref 0.0–0.2)

## 2022-09-29 LAB — HIV ANTIBODY (ROUTINE TESTING W REFLEX): HIV Screen 4th Generation wRfx: NONREACTIVE

## 2022-09-29 SURGERY — ESOPHAGOGASTRODUODENOSCOPY (EGD) WITH PROPOFOL
Anesthesia: Monitor Anesthesia Care

## 2022-09-29 MED ORDER — PROPOFOL 500 MG/50ML IV EMUL
INTRAVENOUS | Status: DC | PRN
  Administered 2022-09-29: 150 ug/kg/min via INTRAVENOUS
  Administered 2022-09-29 (×2): 50 mg via INTRAVENOUS

## 2022-09-29 MED ORDER — LIDOCAINE 2% (20 MG/ML) 5 ML SYRINGE
INTRAMUSCULAR | Status: DC | PRN
  Administered 2022-09-29: 60 mg via INTRAVENOUS

## 2022-09-29 SURGICAL SUPPLY — 15 items

## 2022-09-29 NOTE — Plan of Care (Signed)

## 2022-09-29 NOTE — Hospital Course (Addendum)
Patient is a 57 year old African-American female with past medical history significant for but limited to hypertension, GERD, peptic ulcer disease, anxiety depression and prediabetes as well as stage IIIa kidney disease as well as other comorbidities who presented with abdominal pain.  She reports he has a gastric ulcer and last EGD was in 2018 which showed 2 gastric ulcers which was was less than 1 similar with clean bases.  She was sent to the urgent care on 08/28/2022 for nausea and headache and diagnosed with a viral URI and given tramadol and Zofran.  She is again seen in the [facility] ED on 08/30/2022 for headache and nausea vomiting for 5 days and was discharged after unremarkable workup.  She returned to urgent care on 09/24/2022 with abdominal pain and recurrent nausea vomiting and treated for GERD plus upper URI with Reglan Flonase loratadine and Phenergan.  She reports that she has been having symptoms in June and she felt nauseated and having difficulty eating.  Given that she has been to multiple urgent cares and [facility] facility.  She states that she was given some medication and stopped almost 2 weeks ago but then around July 4 started having less pain and no nausea.  She had not been able to vomit but states that she is able to drink sips and then start again.  She has had poor p.o. intake since July 4.  She states that she has had diarrhea x 2 with no bloody stools.  She also thinks that she lost weight and was admitted for further evaluation of her abdominal pain and GI was consulted.  GI took her for an EGD and was found to have a nonbleeding gastric ulcer.  She also was found to have gastritis was biopsied and she was placed on PPI twice daily.  Diet is being advanced and she is continue to get IV fluid hydration and she stabilizes wanting to go home.  GI felt that she could go home and have outpatient follow-up on the biopsy result.  They also address her colon cancer screening at that time.  She  is stable for discharge at this time will need to follow-up with PCP, medical oncology for her chronic leukocytosis as well as GI for further evaluation and biopsy results.  Assessment and Plan:  AKI on chronic kidney disease stage IIIa Metabolic Acidosis -Baseline Creatinine was around 1 creatinine was increased at this point a half times compared to her baseline and presumed to have occurred in the last 7 days likely due to prerenal etiology from severe hydration -BUN/Cr Trend: Recent Labs  Lab 09/28/22 0758 09/29/22 0356 09/30/22 0055  BUN 44* 19 25*  CREATININE 1.94* 1.20* 1.32*  -Metabolic acidosis likely starvation ketosis in the setting of refractory nausea and she is continue to be hydrated -Received IV fluid hydration prior to discharge -Avoid Nephrotoxic Medications, Contrast Dyes, Hypotension and Dehydration to Ensure Adequate Renal Perfusion and will need to Renally Adjust Meds -Continue to Monitor and Trend Renal Function carefully and CMP within 1 week  Epigastric Pain with Nausea the setting of nonbleeding gastric ulcer -Has Recurrent Epigastric Pain with Nausea with concern for recurrent ulcer(s) -Has a Hx of Gastric Ulcers in 2018 -Has been taking Mobic as it is listed on MAR -Received a dose of IV Flagyl and Rocephin empirically in the ED but will hold further antibiotics at this time -Started on Protonix IV twice daily and changed to po for discharge -Was made n.p.o. and diet was advanced by  GI regular diet after EGD -WBC Trend: Recent Labs  Lab 09/28/22 0758 09/29/22 0356 09/30/22 0055  WBC 14.6* 13.4* 14.1*  -EGD done and showed that the Z-line was regular at 38 cm from the incisors with no gross lesions in the entire esophagus.  Nonbleeding gastric ulcer with no stigmata of bleeding and a single mucosal papule found in the stomach that was biopsied along with gastritis that was biopsied. -Continue twice daily PPI and following up on the cultures -GI  recommends resuming the diet and recommending pantoprazole 40 mg p.o. twice daily and awaiting pathology results with recommendation for no NSAIDs including ibuprofen, naproxen; pathology results can be followed in the outpatient setting  Leukocytosis -In the setting of dehydration and nausea vomiting however this appears to be extremely chronic and has not been normal in any of our records -See above for trend -Continue monitor for signs and symptoms of infection -Repeat CBC within 1 week and will need follow-up with Dr. Leonides Schanz in outpatient setting  Thrombocytosis -Platelet Count Trend: Recent Labs  Lab 09/28/22 0758 09/29/22 0356 09/30/22 0055  PLT 621* 489* 454*  -Continue to Monitor and Trend and repeat CBC in the AM  HTN -Continue amlodipine but hold the benazepril component of the lateral -Continue to hold hydrochlorothiazide -Continue with IV hydralazine as needed -Continue monitor blood pressures per protocol -Last blood pressure reading was 115/66  Anxiety/Depression/Insomnia -C/w Home Meds with mirtazapine 45 mg p.o. nightly, pregabalin 75 mg p.o. twice daily, temazepam 30 mg p.o. nightly as needed -Holding Belsomra   Normocytic Anemia -Hgb/Hct trend: Recent Labs  Lab 09/28/22 0758 09/29/22 0356 09/30/22 0055  HGB 14.4 11.5* 10.7*  HCT 44.9 35.5* 32.7*  MCV 85.0 83.7 83.4  -Check Anemia Panel in the outpatient setting -Continue to Monitor for S/Sx of Bleeding; No overt bleeding noted -Repeat CBC in the AM  Obesity -Complicates overall prognosis and care -Estimated body mass index is 31.18 kg/m as calculated from the following:   Height as of this encounter: 5\' 3"  (1.6 m).   Weight as of this encounter: 79.8 kg.  -Weight Loss and Dietary Counseling given

## 2022-09-29 NOTE — Progress Notes (Signed)
PROGRESS NOTE    Natalie Edwards  VZD:[id number] DOB: Aug 11, 1965 DOA: 09/28/2022 PCP: [name], MD   Brief Narrative:  Patient is a 57 year old African-American female with past medical history significant for but limited to hypertension, GERD, peptic ulcer disease, anxiety depression and prediabetes as well as stage IIIa kidney disease as well as other comorbidities who presented with abdominal pain.  She reports he has a gastric ulcer and last EGD was in 2018 which showed 2 gastric ulcers which was was less than 1 similar with clean bases.  She was sent to the urgent care on 08/28/2022 for nausea and headache and diagnosed with a viral URI and given tramadol and Zofran.  She is again seen in the [place] ED on 08/30/2022 for headache and nausea vomiting for 5 days and was discharged after unremarkable workup.  She returned to urgent care on 09/24/2022 with abdominal pain and recurrent nausea vomiting and treated for GERD plus upper URI with Reglan Flonase loratadine and Phenergan.  She reports that she has been having symptoms in June and she felt nauseated and having difficulty eating.  Given that she has been to multiple urgent cares and [facility] facility.  She states that she was given some medication and stopped almost 2 weeks ago but then around July 4 started having less pain and no nausea.  She had not been able to vomit but states that she is able to drink sips and then start again.  She has had poor p.o. intake since July 4.  She states that she has had diarrhea x 2 with no bloody stools.  She also thinks that she lost weight and was admitted for further evaluation of her abdominal pain and GI was consulted.  GI took her for an EGD and was found to have a nonbleeding gastric ulcer.  She also was found to have gastritis was biopsied and she was placed on PPI twice daily.  Diet is being advanced and she is continue to get IV fluid hydration and anticipate discharge in the next 24 to 48 hours pending  GI clearance  Assessment and Plan:  AKI on chronic kidney disease stage IIIa Metabolic Acidosis -Baseline Creatinine was around 1 creatinine was increased at this point a half times compared to her baseline and presumed to have occurred in the last 7 days likely due to prerenal etiology from severe hydration -BUN/Cr Trend: Recent Labs  Lab 08/30/22 2144 09/28/22 0758 09/29/22 0356  BUN 18 44* 19  CREATININE 1.04* 1.94* 1.20*  -Metabolic acidosis likely starvation ketosis in the setting of refractory nausea and she is continue to be hydrated -Currently getting LR at 75 MLS per hour will continue through today -Avoid Nephrotoxic Medications, Contrast Dyes, Hypotension and Dehydration to Ensure Adequate Renal Perfusion and will need to Renally Adjust Meds -Continue to Monitor and Trend Renal Function carefully and repeat CMP in the AM   Epigastric Pain with Nausea the setting of nonbleeding gastric ulcer -Has Recurrent Epigastric Pain with Nausea with concern for recurrent ulcer(s) -Has a Hx of Gastric Ulcers in 2018 -Has been taking Mobic as it is listed on MAR -Received a dose of IV Flagyl and Rocephin empirically in the ED but will hold further antibiotics at this time -Started on Protonix IV twice daily and changed to po -Was made n.p.o. and diet was advanced by GI regular diet after EGD -WBC Trend: Recent Labs  Lab 08/30/22 2144 09/28/22 0758 09/29/22 0356  WBC 12.9* 14.6* 13.4*  -EGD done  and showed that the Z-line was regular at 38 cm from the incisors with no gross lesions in the entire esophagus.  Nonbleeding gastric ulcer with no stigmata of bleeding and a single mucosal papule found in the stomach that was biopsied along with gastritis that was biopsied. -GI recommends resuming the diet and recommending pantoprazole 40 mg p.o. twice daily and awaiting pathology results with recommendation for no NSAIDs including ibuprofen, naproxen  Leukocytosis -In the setting of  dehydration and nausea vomiting -See above for trend Continue monitor for signs and symptoms of infection Repeat CBC in a.m.  Thrombocytosis -Platelet Count Trend: Recent Labs  Lab 08/30/22 2144 09/28/22 0758 09/29/22 0356  PLT 606* 621* 489*  -Continue to Monitor and Trend and repeat CBC in the AM  HTN -Continue amlodipine but hold the benazepril component of the lateral -Continue to hold hydrochlorothiazide -Continue with IV hydralazine as needed -Continue monitor blood pressures per protocol -Last blood pressure reading was 115/66  Anxiety/Depression/Insomnia -C/w Home Meds with mirtazapine 45 mg p.o. nightly, pregabalin 75 mg p.o. twice daily, temazepam 30 mg p.o. nightly as needed -Holding Belsomra   Normocytic Anemia -Hgb/Hct trend: Recent Labs  Lab 08/30/22 2144 09/28/22 0758 09/29/22 0356  HGB 11.2* 14.4 11.5*  HCT 34.5* 44.9 35.5*  MCV 86.7 85.0 83.7  -Check Anemia Panel in the AM -Continue to Monitor for S/Sx of Bleeding; No overt bleeding noted -Repeat CBC in the AM  Obesity -Complicates overall prognosis and care -Estimated body mass index is 31.18 kg/m as calculated from the following:   Height as of this encounter: 5\' 3"  (1.6 m).   Weight as of this encounter: 79.8 kg.  -Weight Loss and Dietary Counseling given   DVT prophylaxis: SCDs Start: 09/28/22 1224    Code Status: Full Code Family Communication: No family currently at bedside  Disposition Plan:  Level of care: Telemetry Medical Status is: Observation The patient will require care spanning > 2 midnights and should be moved to inpatient because: Needs further clinical   Consultants:  Gastroenterology  Procedures:  EGD Findings:      The Z-line was regular and was found 38 cm from the incisors.      No gross lesions were noted in the entire esophagus.      One non-bleeding linear gastric ulcer with no stigmata of bleeding was       found in the gastric antrum. The lesion was 6 mm in  largest dimension.      A single 30 mm mucosal papule (nodule) with no bleeding and no stigmata       of recent bleeding was found in the gastric antrum. The nodule was Paris       classification IIa (superficial, elevated). Biopsies were taken with a       cold forceps for histology.      Patchy mild inflammation characterized by congestion (edema) and       erythema was found in the entire examined stomach. Biopsies were taken       with a cold forceps for Helicobacter pylori testing.      The cardia and gastric fundus were normal on retroflexion.      The examined duodenum was normal. Impression:               - Z-line regular, 38 cm from the incisors.                           -  No gross lesions in the entire esophagus.                           - Non-bleeding gastric ulcer with no stigmata of                            bleeding.                           - A single mucosal papule (nodule) found in the                            stomach. Biopsied.                           - Gastritis. Biopsied.                           - Normal examined duodenum. Recommendation:           - Patient has a contact number available for                            emergencies. The signs and symptoms of potential                            delayed complications were discussed with the                            patient. Return to normal activities tomorrow.                            Written discharge instructions were provided to the                            patient.                           - Resume previous diet.                           - Continue present medications.                           - No ibuprofen, naproxen, or other non-steroidal                            anti-inflammatory drugs.                           - Use Protonix (pantoprazole) 40 mg PO BID.                           - Await pathology results.                           - Repeat upper endoscopy for surveillance based on  pathology results.  Antimicrobials:  Anti-infectives (From admission, onward)    Start     Dose/Rate Route Frequency Ordered Stop   09/28/22 0845  cefTRIAXone (ROCEPHIN) 2 g in sodium chloride 0.9 % 100 mL IVPB        2 g 200 mL/hr over 30 Minutes Intravenous  Once 09/28/22 0833 09/28/22 0858   09/28/22 0845  metroNIDAZOLE (FLAGYL) IVPB 500 mg        500 mg 100 mL/hr over 60 Minutes Intravenous  Once 09/28/22 1610 09/28/22 1038       Subjective: Seen And examined at bedside and had just come back from her EGD and was hungry and wanting to eat.  She denies any chest pain or shortness breath and says her abdominal pain is improved.  States that she is not feeling nauseous anymore.  No other concerns or complaints this time.  Objective: Vitals:   09/29/22 1550 09/29/22 1552 09/29/22 1553 09/29/22 1858  BP: 113/62 113/65  115/66  Pulse: (!) 111 (!) 111 (!) 109 (!) 105  Resp: 17 17  18   Temp: 98.1 F (36.7 C)   98.2 F (36.8 C)  TempSrc:      SpO2: 97% 97% 97% 98%  Weight:      Height:        Intake/Output Summary (Last 24 hours) at 09/29/2022 1916 Last data filed at 09/29/2022 1800 Gross per 24 hour  Intake 1914.71 ml  Output 150 ml  Net 1764.71 ml   Filed Weights   09/28/22 0756  Weight: 79.8 kg   Examination: Physical Exam:  Constitutional: WN/WD obese African-American female in no acute distress Respiratory: Diminished to auscultation bilaterally, no wheezing, rales, rhonchi or crackles. Normal respiratory effort and patient is not tachypenic. No accessory muscle use.  Unlabored breathing Cardiovascular: RRR, no murmurs / rubs / gallops. S1 and S2 auscultated. No extremity edema.  Abdomen: Soft, non-tender, distended secondary to body habitus. Bowel sounds positive.  GU: Deferred. Musculoskeletal: No clubbing / cyanosis of digits/nails. No joint deformity upper and lower extremities. .  Skin: No rashes, lesions, ulcers on limited skin evaluation.  No induration; Warm and dry.  Neurologic: CN 2-12 grossly intact with no focal deficits. Romberg sign and cerebellar reflexes not assessed.  Psychiatric: Normal judgment and insight. Alert and oriented x 3. Normal mood and appropriate affect.   Data Reviewed: I have personally reviewed following labs and imaging studies  CBC: Recent Labs  Lab 09/28/22 0758 09/29/22 0356  WBC 14.6* 13.4*  NEUTROABS 9.4*  --   HGB 14.4 11.5*  HCT 44.9 35.5*  MCV 85.0 83.7  PLT 621* 489*   Basic Metabolic Panel: Recent Labs  Lab 09/28/22 0758 09/29/22 0356  NA 136 140  K 4.3 4.0  CL 110 107  CO2 13* 19*  GLUCOSE 127* 97  BUN 44* 19  CREATININE 1.94* 1.20*  CALCIUM 9.8 9.4   GFR: Estimated Creatinine Clearance: 51.8 mL/min (A) (by C-G formula based on SCr of 1.2 mg/dL (H)). Liver Function Tests: Recent Labs  Lab 09/28/22 0758 09/29/22 0356  AST 18 14*  ALT 21 17  ALKPHOS 57 42  BILITOT 0.8 0.7  PROT 8.6* 6.5  ALBUMIN 4.6 3.6   Recent Labs  Lab 09/28/22 0758  LIPASE 29   No results for input(s): "AMMONIA" in the last 168 hours. Coagulation Profile: Recent Labs  Lab 09/28/22 0930  INR 1.1   Cardiac Enzymes: No results for input(s): "CKTOTAL", "CKMB", "CKMBINDEX", "TROPONINI" in the last 168  hours. BNP (last 3 results) No results for input(s): "PROBNP" in the last 8760 hours. HbA1C: No results for input(s): "HGBA1C" in the last 72 hours. CBG: No results for input(s): "GLUCAP" in the last 168 hours. Lipid Profile: No results for input(s): "CHOL", "HDL", "LDLCALC", "TRIG", "CHOLHDL", "LDLDIRECT" in the last 72 hours. Thyroid Function Tests: No results for input(s): "TSH", "T4TOTAL", "FREET4", "T3FREE", "THYROIDAB" in the last 72 hours. Anemia Panel: No results for input(s): "VITAMINB12", "FOLATE", "FERRITIN", "TIBC", "IRON", "RETICCTPCT" in the last 72 hours. Sepsis Labs: Recent Labs  Lab 09/28/22 0815 09/28/22 0930  LATICACIDVEN 1.2 1.3    Recent Results (from  the past 240 hour(s))  Resp panel by RT-PCR (RSV, Flu A&B, Covid) Anterior Nasal Swab     Status: None   Collection Time: 09/28/22  8:04 AM   Specimen: Anterior Nasal Swab  Result Value Ref Range Status   SARS Coronavirus 2 by RT PCR NEGATIVE NEGATIVE Final   Influenza A by PCR NEGATIVE NEGATIVE Final   Influenza B by PCR NEGATIVE NEGATIVE Final    Comment: (NOTE) The Xpert Xpress SARS-CoV-2/FLU/RSV plus assay is intended as an aid in the diagnosis of influenza from Nasopharyngeal swab specimens and should not be used as a sole basis for treatment. Nasal washings and aspirates are unacceptable for Xpert Xpress SARS-CoV-2/FLU/RSV testing.  Fact Sheet for Patients: BloggerCourse.com  Fact Sheet for Healthcare Providers: SeriousBroker.it  This test is not yet approved or cleared by the [place] FDA and has been authorized for detection and/or diagnosis of SARS-CoV-2 by FDA under an Emergency Use Authorization (EUA). This EUA will remain in effect (meaning this test can be used) for the duration of the COVID-19 declaration under Section 564(b)(1) of the Act, 21 U.S.C. section 360bbb-3(b)(1), unless the authorization is terminated or revoked.     Resp Syncytial Virus by PCR NEGATIVE NEGATIVE Final    Comment: (NOTE) Fact Sheet for Patients: BloggerCourse.com  Fact Sheet for Healthcare Providers: SeriousBroker.it  This test is not yet approved or cleared by the [place] FDA and has been authorized for detection and/or diagnosis of SARS-CoV-2 by FDA under an Emergency Use Authorization (EUA). This EUA will remain in effect (meaning this test can be used) for the duration of the COVID-19 declaration under Section 564(b)(1) of the Act, 21 U.S.C. section 360bbb-3(b)(1), unless the authorization is terminated or revoked.  Performed at [facility], [address],  [place], [place] [address]   Blood Culture (routine x 2)     Status: None (Preliminary result)   Collection Time: 09/28/22  8:33 AM   Specimen: BLOOD LEFT ARM  Result Value Ref Range Status   Specimen Description BLOOD LEFT ARM  Final   Special Requests   Final    BOTTLES DRAWN AEROBIC AND ANAEROBIC Blood Culture results may not be optimal due to an inadequate volume of blood received in culture bottles   Culture   Final    NO GROWTH < 24 HOURS Performed at [facility], [address], [place], [place] [address]    Report Status PENDING  Incomplete  Blood Culture (routine x 2)     Status: None (Preliminary result)   Collection Time: 09/28/22  8:38 AM   Specimen: BLOOD RIGHT HAND  Result Value Ref Range Status   Specimen Description BLOOD RIGHT HAND  Final   Special Requests   Final    BOTTLES DRAWN AEROBIC AND ANAEROBIC Blood Culture results may not be optimal due to an  inadequate volume of blood received in culture bottles   Culture   Final    NO GROWTH < 24 HOURS Performed at [facility], [address] [place], [place] [address]    Report Status PENDING  Incomplete     Radiology Studies: CT ABDOMEN PELVIS W CONTRAST  Result Date: 09/28/2022 CLINICAL DATA:  Abdominal pain and nausea. EXAM: CT ABDOMEN AND PELVIS WITH CONTRAST TECHNIQUE: Multidetector CT imaging of the abdomen and pelvis was performed using the standard protocol following bolus administration of intravenous contrast. RADIATION DOSE REDUCTION: This exam was performed according to the departmental dose-optimization program which includes automated exposure control, adjustment of the mA and/or kV according to patient size and/or use of iterative reconstruction technique. CONTRAST:  65mL OMNIPAQUE IOHEXOL 350 MG/ML SOLN COMPARISON:  CT abdomen pelvis dated Jul 31, 2016. FINDINGS: Lower chest: No acute abnormality. Hepatobiliary: No focal liver abnormality is seen. Status post cholecystectomy. Unchanged mildly  prominent common bile duct due to reservoir effect. Pancreas: Unremarkable. No pancreatic ductal dilatation or surrounding inflammatory changes. Spleen: Normal in size without focal abnormality. Adrenals/Urinary Tract: Unchanged 1.7 cm left adrenal adenoma. No follow-up imaging is recommended. Normal right adrenal gland. Bilateral renal simple cysts again noted. No follow-up imaging is recommended. No renal calculi or hydronephrosis. The bladder is unremarkable. Stomach/Bowel: Stomach is within normal limits. Appendix appears normal. No evidence of bowel wall thickening, distention, or inflammatory changes. Left-sided colonic diverticulosis. Vascular/Lymphatic: Aortic atherosclerosis. No enlarged abdominal or pelvic lymph nodes. Reproductive: Status post hysterectomy. No adnexal masses. Other: Small fat containing midline supraumbilical hernia has slightly increased in size. No free fluid or pneumoperitoneum. Musculoskeletal: No acute or significant osseous findings. IMPRESSION: 1. No acute intra-abdominal process. 2.  Aortic Atherosclerosis (ICD10-I70.0). Electronically Signed   By: Obie Dredge M.D.   On: 09/28/2022 10:56   DG Chest Port 1 View  Result Date: 09/28/2022 CLINICAL DATA:  57 year old female with possible sepsis. EXAM: PORTABLE CHEST 1 VIEW COMPARISON:  Chest x-ray 08/30/2022. FINDINGS: Lung volumes are normal. No consolidative airspace disease. No pleural effusions. No pneumothorax. No pulmonary nodule or mass noted. Pulmonary vasculature and the cardiomediastinal silhouette are within normal limits. IMPRESSION: No radiographic evidence of acute cardiopulmonary disease. Electronically Signed   By: Trudie Reed M.D.   On: 09/28/2022 08:43    Scheduled Meds:  amLODipine  10 mg Oral Daily   mirtazapine  45 mg Oral QHS   ondansetron  4 mg Oral Q8H   Or   ondansetron (ZOFRAN) IV  4 mg Intravenous Q8H   pantoprazole (PROTONIX) IV  40 mg Intravenous Q12H   pregabalin  75 mg Oral BID    sodium chloride flush  3 mL Intravenous Q12H   Continuous Infusions:  lactated ringers Stopped (09/29/22 1040)    LOS: 0 days   Marguerita Merles, DO [facility] Available via Epic secure chat 7am-7pm After these hours, please refer to coverage provider listed on [email or web address] 09/29/2022, 7:16 PM

## 2022-09-29 NOTE — Interval H&P Note (Signed)
History and Physical Interval Note:  09/29/2022 9:48 AM  Natalie Edwards  has presented today for surgery, with the diagnosis of nausea/ vomiting.  The various methods of treatment have been discussed with the patient and family. After consideration of risks, benefits and other options for treatment, the patient has consented to  Procedure(s): ESOPHAGOGASTRODUODENOSCOPY (EGD) WITH PROPOFOL (N/A) as a surgical intervention.  The patient's history has been reviewed, patient examined, no change in status, stable for surgery.  I have reviewed the patient's chart and labs.  Questions were answered to the patient's satisfaction.     Lily Velasquez

## 2022-09-29 NOTE — Anesthesia Postprocedure Evaluation (Signed)
Anesthesia Post Note  Patient: Natalie Edwards  Procedure(s) Performed: ESOPHAGOGASTRODUODENOSCOPY (EGD) WITH PROPOFOL BIOPSY     Patient location during evaluation: PACU Anesthesia Type: MAC Level of consciousness: awake and alert Pain management: pain level controlled Vital Signs Assessment: post-procedure vital signs reviewed and stable Respiratory status: spontaneous breathing Cardiovascular status: stable Anesthetic complications: no   No notable events documented.  Last Vitals:  Vitals:   09/29/22 1552 09/29/22 1553  BP: 113/65   Pulse: (!) 111 (!) 109  Resp: 17   Temp:    SpO2: 97% 97%    Last Pain:  Vitals:   09/29/22 1626  TempSrc:   PainSc: 10-Worst pain ever                 Lewie Loron

## 2022-09-29 NOTE — TOC CM/SW Note (Signed)
Transition of Care [facility]) - Inpatient Brief Assessment   Patient Details  Name: Natalie Edwards MRN: [id number] Date of Birth: 1965-06-01  Transition of [facility]) CM/SW Contact:    Tom-Johnson, Hershal Coria, RN Phone Number: 09/29/2022, 3:45 PM   Clinical Narrative:  Patient presented to the ED with Abdominal pain, N/V, found to have AKI. GI following.   No TOC needs or recommendations noted at this time. CM will continue to follow as patient progresses with care towards discharge.    Transition of Care Asessment: Insurance and Status: Insurance coverage has been reviewed Patient has primary care physician: Yes Home environment has been reviewed: Yes Prior level of function:: Independent Prior/Current Home Services: No current home services Social Determinants of Health Reivew: SDOH reviewed no interventions necessary Readmission risk has been reviewed: Yes Transition of care needs: no transition of care needs at this time

## 2022-09-29 NOTE — Care Management Obs Status (Signed)
MEDICARE OBSERVATION STATUS NOTIFICATION   Patient Details  Name: Natalie Edwards MRN: [id number] Date of Birth: 1965/10/15   Medicare Observation Status Notification Given:  Yes    Tom-Johnson, Hershal Coria, RN 09/29/2022, 3:42 PM

## 2022-09-29 NOTE — Transfer of Care (Signed)
Immediate Anesthesia Transfer of Care Note  Patient: Natalie Edwards  Procedure(s) Performed: ESOPHAGOGASTRODUODENOSCOPY (EGD) WITH PROPOFOL BIOPSY  Patient Location: Endoscopy Unit  Anesthesia Type:MAC  Level of Consciousness: awake, drowsy, and patient cooperative  Airway & Oxygen Therapy: Patient Spontanous Breathing and Patient connected to nasal cannula oxygen  Post-op Assessment: Report given to RN and Post -op Vital signs reviewed and stable  Post vital signs: Reviewed and stable  Last Vitals:  Vitals Value Taken Time  BP    Temp    Pulse 91 09/29/22 1041  Resp    SpO2 97 % 09/29/22 1041  Vitals shown include unfiled device data.  Last Pain:  Vitals:   09/29/22 0942  TempSrc: Temporal  PainSc: 0-No pain         Complications: No notable events documented.

## 2022-09-29 NOTE — Anesthesia Preprocedure Evaluation (Addendum)
Anesthesia Evaluation  Patient identified by MRN, date of birth, ID band Patient awake    Reviewed: Allergy & Precautions, NPO status , Patient's Chart, lab work & pertinent test results  Airway Mallampati: II  TM Distance: >3 FB Neck ROM: Full    Dental  (+) Dental Advisory Given, Teeth Intact   Pulmonary Current Smoker and Patient abstained from smoking.   Pulmonary exam normal breath sounds clear to auscultation       Cardiovascular hypertension, Pt. on medications Normal cardiovascular exam Rhythm:Regular Rate:Normal     Neuro/Psych  PSYCHIATRIC DISORDERS Anxiety Depression    negative neurological ROS     GI/Hepatic Neg liver ROS, PUD,GERD  ,,  Endo/Other  negative endocrine ROS    Renal/GU Renal disease     Musculoskeletal negative musculoskeletal ROS (+)    Abdominal   Peds  Hematology  (+) Blood dyscrasia, anemia   Anesthesia Other Findings   Reproductive/Obstetrics                             Anesthesia Physical Anesthesia Plan  ASA: 2  Anesthesia Plan: MAC   Post-op Pain Management: Minimal or no pain anticipated   Induction: Intravenous  PONV Risk Score and Plan: 1 and Propofol infusion, TIVA and Treatment may vary due to age or medical condition  Airway Management Planned: Natural Airway  Additional Equipment:   Intra-op Plan:   Post-operative Plan:   Informed Consent: I have reviewed the patients History and Physical, chart, labs and discussed the procedure including the risks, benefits and alternatives for the proposed anesthesia with the patient or authorized representative who has indicated his/her understanding and acceptance.     Dental advisory given  Plan Discussed with: CRNA  Anesthesia Plan Comments:         Anesthesia Quick Evaluation

## 2022-09-29 NOTE — Op Note (Signed)
[facility] Patient Name: Natalie Edwards Procedure Date : 09/29/2022 MRN: [id number] Attending MD: Napoleon Form , MD, [id number] Date of Birth: 11-Aug-1965 CSN: [id number] Age: 57 Admit Type: Inpatient Procedure:                Upper GI endoscopy Indications:              Recent gastrointestinal bleeding, Suspected upper                            gastrointestinal bleeding, Iron deficiency anemia                            due to suspected upper gastrointestinal bleeding Providers:                Napoleon Form, MD, Glory Rosebush, RN, Marja Kays, Technician Referring MD:              Medicines:                Monitored Anesthesia Care Complications:            No immediate complications. Estimated Blood Loss:     Estimated blood loss was minimal. Procedure:                Pre-Anesthesia Assessment:                           - Prior to the procedure, a History and Physical                            was performed, and patient medications and                            allergies were reviewed. The patient's tolerance of                            previous anesthesia was also reviewed. The risks                            and benefits of the procedure and the sedation                            options and risks were discussed with the patient.                            All questions were answered, and informed consent                            was obtained. Prior Anticoagulants: The patient has                            taken no anticoagulant or antiplatelet agents. ASA  Grade Assessment: III - A patient with severe                            systemic disease. After reviewing the risks and                            benefits, the patient was deemed in satisfactory                            condition to undergo the procedure.                           After obtaining informed consent, the endoscope was                             passed under direct vision. Throughout the                            procedure, the patient's blood pressure, pulse, and                            oxygen saturations were monitored continuously. The                            [id number]-H190 ([id number] Olympus endoscope was introduced                            through the mouth, and advanced to the second part                            of duodenum. The upper GI endoscopy was                            accomplished without difficulty. The patient                            tolerated the procedure well. Scope In: Scope Out: Findings:      The Z-line was regular and was found 38 cm from the incisors.      No gross lesions were noted in the entire esophagus.      One non-bleeding linear gastric ulcer with no stigmata of bleeding was       found in the gastric antrum. The lesion was 6 mm in largest dimension.      A single 30 mm mucosal papule (nodule) with no bleeding and no stigmata       of recent bleeding was found in the gastric antrum. The nodule was [place]       classification IIa (superficial, elevated). Biopsies were taken with a       cold forceps for histology.      Patchy mild inflammation characterized by congestion (edema) and       erythema was found in the entire examined stomach. Biopsies were taken       with a cold forceps for Helicobacter pylori testing.      The cardia and gastric fundus were normal on retroflexion.  The examined duodenum was normal. Impression:               - Z-line regular, 38 cm from the incisors.                           - No gross lesions in the entire esophagus.                           - Non-bleeding gastric ulcer with no stigmata of                            bleeding.                           - A single mucosal papule (nodule) found in the                            stomach. Biopsied.                           - Gastritis. Biopsied.                           - Normal  examined duodenum. Recommendation:           - Patient has a contact number available for                            emergencies. The signs and symptoms of potential                            delayed complications were discussed with the                            patient. Return to normal activities tomorrow.                            Written discharge instructions were provided to the                            patient.                           - Resume previous diet.                           - Continue present medications.                           - No ibuprofen, naproxen, or other non-steroidal                            anti-inflammatory drugs.                           - Use Protonix (pantoprazole) 40 mg PO BID.                           -  Await pathology results.                           - Repeat upper endoscopy for surveillance based on                            pathology results. Procedure Code(s):        --- Professional ---                           [phone], Esophagogastroduodenoscopy, flexible,                            transoral; with biopsy, single or multiple Diagnosis Code(s):        --- Professional ---                           K25.9, Gastric ulcer, unspecified as acute or                            chronic, without hemorrhage or perforation                           K31.89, Other diseases of stomach and duodenum                           K29.70, Gastritis, unspecified, without bleeding                           K92.2, Gastrointestinal hemorrhage, unspecified                           D50.9, Iron deficiency anemia, unspecified CPT copyright 2022 [employer]. All rights reserved. The codes documented in this report are preliminary and upon coder review may  be revised to meet current compliance requirements. Napoleon Form, MD 09/29/2022 10:46:14 AM This report has been signed electronically. Number of Addenda: 0

## 2022-09-30 ENCOUNTER — Encounter (HOSPITAL_COMMUNITY): Payer: Self-pay | Admitting: Gastroenterology

## 2022-09-30 DIAGNOSIS — D75839 Thrombocytosis, unspecified: Secondary | ICD-10-CM

## 2022-09-30 DIAGNOSIS — E860 Dehydration: Secondary | ICD-10-CM | POA: Diagnosis present

## 2022-09-30 DIAGNOSIS — Z5986 Financial insecurity: Secondary | ICD-10-CM | POA: Diagnosis not present

## 2022-09-30 DIAGNOSIS — G4700 Insomnia, unspecified: Secondary | ICD-10-CM | POA: Diagnosis present

## 2022-09-30 DIAGNOSIS — N1831 Chronic kidney disease, stage 3a: Secondary | ICD-10-CM | POA: Diagnosis present

## 2022-09-30 DIAGNOSIS — K3189 Other diseases of stomach and duodenum: Secondary | ICD-10-CM | POA: Diagnosis not present

## 2022-09-30 DIAGNOSIS — Z79899 Other long term (current) drug therapy: Secondary | ICD-10-CM | POA: Diagnosis not present

## 2022-09-30 DIAGNOSIS — Z888 Allergy status to other drugs, medicaments and biological substances status: Secondary | ICD-10-CM | POA: Diagnosis not present

## 2022-09-30 DIAGNOSIS — N179 Acute kidney failure, unspecified: Secondary | ICD-10-CM | POA: Diagnosis not present

## 2022-09-30 DIAGNOSIS — K2970 Gastritis, unspecified, without bleeding: Secondary | ICD-10-CM | POA: Diagnosis present

## 2022-09-30 DIAGNOSIS — K259 Gastric ulcer, unspecified as acute or chronic, without hemorrhage or perforation: Secondary | ICD-10-CM

## 2022-09-30 DIAGNOSIS — D509 Iron deficiency anemia, unspecified: Secondary | ICD-10-CM | POA: Diagnosis present

## 2022-09-30 DIAGNOSIS — R110 Nausea: Secondary | ICD-10-CM | POA: Diagnosis not present

## 2022-09-30 DIAGNOSIS — R1013 Epigastric pain: Secondary | ICD-10-CM

## 2022-09-30 DIAGNOSIS — F419 Anxiety disorder, unspecified: Secondary | ICD-10-CM

## 2022-09-30 DIAGNOSIS — E872 Acidosis, unspecified: Secondary | ICD-10-CM | POA: Diagnosis not present

## 2022-09-30 DIAGNOSIS — I129 Hypertensive chronic kidney disease with stage 1 through stage 4 chronic kidney disease, or unspecified chronic kidney disease: Secondary | ICD-10-CM | POA: Diagnosis present

## 2022-09-30 DIAGNOSIS — F32A Depression, unspecified: Secondary | ICD-10-CM

## 2022-09-30 DIAGNOSIS — F17210 Nicotine dependence, cigarettes, uncomplicated: Secondary | ICD-10-CM | POA: Diagnosis present

## 2022-09-30 DIAGNOSIS — K921 Melena: Secondary | ICD-10-CM | POA: Diagnosis present

## 2022-09-30 DIAGNOSIS — E785 Hyperlipidemia, unspecified: Secondary | ICD-10-CM | POA: Diagnosis present

## 2022-09-30 DIAGNOSIS — R7303 Prediabetes: Secondary | ICD-10-CM | POA: Diagnosis present

## 2022-09-30 DIAGNOSIS — Z1152 Encounter for screening for COVID-19: Secondary | ICD-10-CM | POA: Diagnosis not present

## 2022-09-30 DIAGNOSIS — Z6831 Body mass index (BMI) 31.0-31.9, adult: Secondary | ICD-10-CM | POA: Diagnosis not present

## 2022-09-30 DIAGNOSIS — E669 Obesity, unspecified: Secondary | ICD-10-CM | POA: Diagnosis present

## 2022-09-30 DIAGNOSIS — I10 Essential (primary) hypertension: Secondary | ICD-10-CM | POA: Diagnosis not present

## 2022-09-30 DIAGNOSIS — K219 Gastro-esophageal reflux disease without esophagitis: Secondary | ICD-10-CM | POA: Diagnosis present

## 2022-09-30 DIAGNOSIS — Z87898 Personal history of other specified conditions: Secondary | ICD-10-CM

## 2022-09-30 DIAGNOSIS — Z801 Family history of malignant neoplasm of trachea, bronchus and lung: Secondary | ICD-10-CM | POA: Diagnosis not present

## 2022-09-30 LAB — CBC WITH DIFFERENTIAL/PLATELET
Abs Immature Granulocytes: 0.04 10*3/uL (ref 0.00–0.07)
Basophils Absolute: 0 10*3/uL (ref 0.0–0.1)
Basophils Relative: 0 %
Eosinophils Absolute: 0.1 10*3/uL (ref 0.0–0.5)
Eosinophils Relative: 1 %
HCT: 32.7 % — ABNORMAL LOW (ref 36.0–46.0)
Hemoglobin: 10.7 g/dL — ABNORMAL LOW (ref 12.0–15.0)
Immature Granulocytes: 0 %
Lymphocytes Relative: 33 %
Lymphs Abs: 4.6 10*3/uL — ABNORMAL HIGH (ref 0.7–4.0)
MCH: 27.3 pg (ref 26.0–34.0)
MCHC: 32.7 g/dL (ref 30.0–36.0)
MCV: 83.4 fL (ref 80.0–100.0)
Monocytes Absolute: 1.3 10*3/uL — ABNORMAL HIGH (ref 0.1–1.0)
Monocytes Relative: 9 %
Neutro Abs: 8 10*3/uL — ABNORMAL HIGH (ref 1.7–7.7)
Neutrophils Relative %: 57 %
Platelets: 454 10*3/uL — ABNORMAL HIGH (ref 150–400)
RBC: 3.92 MIL/uL (ref 3.87–5.11)
RDW: 16.9 % — ABNORMAL HIGH (ref 11.5–15.5)
WBC: 14.1 10*3/uL — ABNORMAL HIGH (ref 4.0–10.5)
nRBC: 0 % (ref 0.0–0.2)

## 2022-09-30 LAB — COMPREHENSIVE METABOLIC PANEL
ALT: 15 U/L (ref 0–44)
AST: 13 U/L — ABNORMAL LOW (ref 15–41)
Albumin: 3.1 g/dL — ABNORMAL LOW (ref 3.5–5.0)
Alkaline Phosphatase: 44 U/L (ref 38–126)
Anion gap: 6 (ref 5–15)
BUN: 25 mg/dL — ABNORMAL HIGH (ref 6–20)
CO2: 21 mmol/L — ABNORMAL LOW (ref 22–32)
Calcium: 8.6 mg/dL — ABNORMAL LOW (ref 8.9–10.3)
Chloride: 112 mmol/L — ABNORMAL HIGH (ref 98–111)
Creatinine, Ser: 1.32 mg/dL — ABNORMAL HIGH (ref 0.44–1.00)
GFR, Estimated: 47 mL/min — ABNORMAL LOW (ref >60)
Glucose, Bld: 132 mg/dL — ABNORMAL HIGH (ref 70–99)
Potassium: 3.8 mmol/L (ref 3.5–5.1)
Sodium: 139 mmol/L (ref 135–145)
Total Bilirubin: 0.5 mg/dL (ref 0.3–1.2)
Total Protein: 6.4 g/dL — ABNORMAL LOW (ref 6.5–8.1)

## 2022-09-30 LAB — SURGICAL PATHOLOGY

## 2022-09-30 LAB — PHOSPHORUS: Phosphorus: 2.5 mg/dL (ref 2.5–4.6)

## 2022-09-30 LAB — MAGNESIUM: Magnesium: 1.5 mg/dL — ABNORMAL LOW (ref 1.7–2.4)

## 2022-09-30 MED ORDER — ACETAMINOPHEN 325 MG PO TABS: 650.0000 mg | ORAL_TABLET | Freq: Four times a day (QID) | ORAL | 0 refills | Status: DC | PRN

## 2022-09-30 MED ORDER — AMLODIPINE BESYLATE 10 MG PO TABS: 10.0000 mg | ORAL_TABLET | Freq: Every day | ORAL | 0 refills | Status: DC

## 2022-09-30 MED ORDER — PANTOPRAZOLE SODIUM 40 MG PO TBEC: 40.0000 mg | DELAYED_RELEASE_TABLET | Freq: Two times a day (BID) | ORAL | 0 refills | Status: DC

## 2022-09-30 MED ORDER — MAGNESIUM SULFATE 2 GM/50ML IV SOLN
2.0000 g | Freq: Once | INTRAVENOUS | Status: AC
  Administered 2022-09-30: 2 g via INTRAVENOUS
  Filled 2022-09-30: qty 50

## 2022-09-30 MED ORDER — POLYETHYLENE GLYCOL 3350 17 G PO PACK: 17.0000 g | PACK | Freq: Every day | ORAL | 0 refills | Status: DC | PRN

## 2022-09-30 NOTE — Progress Notes (Signed)
DISCHARGE NOTE HOME [name] to be discharged Home per MD order. Diagnosis, treatment, medications, and follow up appointment discussed in detail. Patient verbalized understanding.  Skin clean, dry and intact without evidence of skin break down.IV catheter discontinued intact. Site without signs and symptoms of complications. No complaints noted. VSS.  An After Visit Summary (AVS) was printed and given to the patient.   Tresa Endo, RN

## 2022-09-30 NOTE — Progress Notes (Signed)
Daily Progress Note  DOA: 09/28/2022 Hospital Day: 3 Chief Complaint: abdominal pain, nausea / vomiting   Assessment and Plan:    Brief Narrative:  Natalie Edwards is a 57 y.o. year old female with abdominal pain, N/V and findings of non-bleeding gastric ulcer / mucosal papule ( nodule) in stomach on EGD -No acute findings on CT scan  -Await biopsy results -Continue BID Pantoprazole -Our scheduling department will call her with a follow up appt with me  Mild A[place]on anemia, possibly dilutional from IVF. -Hgb 10.7  CKD 2  HTN  Colon cancer screening.  -Will address at follow up   Subjective / New Events:   No further abdominal pain or n/v. Tolerated solids for lunch. Wants to go home.   EGD 09/29/22 - Z-line regular, 38 cm from the incisors. - No gross lesions in the entire esophagus. - Non-bleeding gastric ulcer with no stigmata of bleeding. - A single mucosal papule (nodule) found in the stomach. Biopsied. - Gastritis. Biopsied. - Normal examined duodenum.   Objective:   Recent Labs    09/28/22 0758 09/29/22 0356 09/30/22 0055  WBC 14.6* 13.4* 14.1*  HGB 14.4 11.5* 10.7*  HCT 44.9 35.5* 32.7*  PLT 621* 489* 454*   BMET Recent Labs    09/28/22 0758 09/29/22 0356 09/30/22 0055  NA 136 140 139  K 4.3 4.0 3.8  CL 110 107 112*  CO2 13* 19* 21*  GLUCOSE 127* 97 132*  BUN 44* 19 25*  CREATININE 1.94* 1.20* 1.32*  CALCIUM 9.8 9.4 8.6*   LFT Recent Labs    09/30/22 0055  PROT 6.4*  ALBUMIN 3.1*  AST 13*  ALT 15  ALKPHOS 44  BILITOT 0.5   PT/INR Recent Labs    09/28/22 0930  LABPROT 14.5  INR 1.1     Imaging:  CT ABDOMEN PELVIS W CONTRAST CLINICAL DATA:  Abdominal pain and nausea.  EXAM: CT ABDOMEN AND PELVIS WITH CONTRAST  TECHNIQUE: Multidetector CT imaging of the abdomen and pelvis was performed using the standard protocol following bolus administration of intravenous contrast.  RADIATION DOSE REDUCTION: This exam was performed  according to the departmental dose-optimization program which includes automated exposure control, adjustment of the mA and/or kV according to patient size and/or use of iterative reconstruction technique.  CONTRAST:  65mL OMNIPAQUE IOHEXOL 350 MG/ML SOLN  COMPARISON:  CT abdomen pelvis dated Jul 31, 2016.  FINDINGS: Lower chest: No acute abnormality.  Hepatobiliary: No focal liver abnormality is seen. Status post cholecystectomy. Unchanged mildly prominent common bile duct due to reservoir effect.  Pancreas: Unremarkable. No pancreatic ductal dilatation or surrounding inflammatory changes.  Spleen: Normal in size without focal abnormality.  Adrenals/Urinary Tract: Unchanged 1.7 cm left adrenal adenoma. No follow-up imaging is recommended. Normal right adrenal gland. Bilateral renal simple cysts again noted. No follow-up imaging is recommended. No renal calculi or hydronephrosis. The bladder is unremarkable.  Stomach/Bowel: Stomach is within normal limits. Appendix appears normal. No evidence of bowel wall thickening, distention, or inflammatory changes. Left-sided colonic diverticulosis.  Vascular/Lymphatic: Aortic atherosclerosis. No enlarged abdominal or pelvic lymph nodes.  Reproductive: Status post hysterectomy. No adnexal masses.  Other: Small fat containing midline supraumbilical hernia has slightly increased in size. No free fluid or pneumoperitoneum.  Musculoskeletal: No acute or significant osseous findings.  IMPRESSION: 1. No acute intra-abdominal process. 2.  Aortic Atherosclerosis (ICD10-I70.0).  Electronically Signed   By: Obie Dredge M.D.   On: 09/28/2022 10:56 DG Chest [facility]  CLINICAL DATA:  57 year old female with possible sepsis.  EXAM: PORTABLE CHEST 1 VIEW  COMPARISON:  Chest x-ray 08/30/2022.  FINDINGS: Lung volumes are normal. No consolidative airspace disease. No pleural effusions. No pneumothorax. No pulmonary nodule or  mass noted. Pulmonary vasculature and the cardiomediastinal silhouette are within normal limits.  IMPRESSION: No radiographic evidence of acute cardiopulmonary disease.  Electronically Signed   By: Trudie Reed M.D.   On: 09/28/2022 08:43     Scheduled inpatient medications:   amLODipine  10 mg Oral Daily   mirtazapine  45 mg Oral QHS   pantoprazole (PROTONIX) IV  40 mg Intravenous Q12H   pregabalin  75 mg Oral BID   sodium chloride flush  3 mL Intravenous Q12H   Continuous inpatient infusions:   lactated ringers 75 mL/hr at 09/30/22 0907   PRN inpatient medications: acetaminophen **OR** acetaminophen, bisacodyl, hydrALAZINE, morphine injection, polyethylene glycol, promethazine, temazepam  Vital signs in last 24 hours: Temp:  [97.8 F (36.6 C)-98.5 F (36.9 C)] 98.1 F (36.7 C) (07/16 0807) Pulse Rate:  [72-117] 87 (07/16 0807) Resp:  [17-20] 18 (07/16 0454) BP: (88-127)/(55-75) 110/63 (07/16 0807) SpO2:  [95 %-99 %] 97 % (07/16 0807) Last BM Date : 09/26/22  Intake/Output Summary (Last 24 hours) at 09/30/2022 1303 Last data filed at 09/30/2022 0455 Gross per 24 hour  Intake 580 ml  Output 500 ml  Net 80 ml    Intake/Output from previous day: 07/15 0701 - 07/16 0700 In: 780 [P.O.:580; I.V.:200] Out: 650 [Urine:650] Intake/Output this shift: No intake/output data recorded.   Physical Exam:  General: Alert female in NAD Heart:  Regular rate and rhythm.  Pulmonary: Normal respiratory effort Abdomen: Soft, nondistended, nontender. Normal bowel sounds. Extremities: No lower extremity edema  Neurologic: Alert and oriented Psych: Pleasant. Cooperative. Insight appears normal.    Principal Problem:   AKI (acute kidney injury) (HCC) Active Problems:   Thrombocytosis   Gastric nodule   H/O ulcer disease   Essential hypertension   Anxiety and depression   Metabolic acidosis   Epigastric pain   Nausea   Melena   Gastric ulcer without hemorrhage or  perforation     LOS: 0 days   Willette Cluster ,NP 09/30/2022, 1:03 PM

## 2022-09-30 NOTE — TOC Transition Note (Signed)
Transition of Care [facility]) - CM/SW Discharge Note   Patient Details  Name: Natalie Edwards MRN: [id number] Date of Birth: 1965-12-22  [facility] CM/SW Contact:  Tom-Johnson, Hershal Coria, RN Phone Number: 09/30/2022, 3:48 PM   Clinical Narrative:     Patient is scheduled for discharge today.  Readmission Risk Assessment done. Outpatient f/u, hospital f/u and discharge instructions on AVS. No TOC needs or recommendations noted. Family to transport at discharge.  No further TOC needs noted.       Final next level of care: Home/Self Care Barriers to Discharge: Barriers Resolved   Patient Goals and CMS Choice CMS Medicare.gov Compare Post Acute Care list provided to:: Patient Choice offered to / list presented to : NA  Discharge Placement                  Patient to be transferred to facility by: Family      Discharge Plan and Services Additional resources added to the After Visit Summary for                  DME Arranged: N/A DME Agency: NA       HH Arranged: NA HH Agency: NA        Social Determinants of Health (SDOH) Interventions SDOH Screenings   Food Insecurity: No Food Insecurity (09/28/2022)  Housing: Medium Risk (09/28/2022)  Transportation Needs: No Transportation Needs (09/28/2022)  Utilities: Not At Risk (09/28/2022)  Depression (PHQ2-9): Low Risk  (09/01/2022)  Recent Concern: Depression (PHQ2-9) - High Risk (08/21/2022)  Financial Resource Strain: High Risk (05/22/2022)  Physical Activity: Sufficiently Active (05/22/2022)  Social Connections: Moderately Isolated (05/22/2022)  Stress: Stress Concern Present (05/22/2022)  Tobacco Use: High Risk (09/29/2022)     Readmission Risk Interventions    09/30/2022    3:47 PM  Readmission Risk Prevention Plan  Transportation Screening Complete  PCP or Specialist Appt within 5-7 Days Complete  Home Care Screening Complete  Medication Review (RN CM) Referral to Pharmacy

## 2022-09-30 NOTE — Discharge Summary (Signed)
Physician Discharge Summary   Patient: Natalie Edwards MRN: [id number] DOB: 12/31/1965  Admit date:     09/28/2022  Discharge date: 09/30/22  Discharge Physician: Marguerita Merles, DO   PCP: [name], MD   Recommendations at discharge:   Follow-up with PCP within 1 to 2 weeks and repeat CBC, CMP, mag, Phos within 1 week Follow-up with medical oncology Dr. Leonides Schanz for chronic leukocytosis and reestablishment Follow-up with gastroenterology in the outpatient setting and follow-up on the biopsy results further the gastric ulcer mucosal papule biopsies; the GI scheduling department will call for follow-up appointment and also the patient will have a colon cancer screening addressed at the outpatient follow-up visit.  Discharge Diagnoses: Principal Problem:   AKI (acute kidney injury) (HCC) Active Problems:   Thrombocytosis   Gastric nodule   H/O ulcer disease   Essential hypertension   Anxiety and depression   Metabolic acidosis   Epigastric pain   Nausea   Melena   Gastric ulcer without hemorrhage or perforation  Resolved Problems:   * No resolved hospital problems. *  Hospital Course: Patient is a 57 year old African-American female with past medical history significant for but limited to hypertension, GERD, peptic ulcer disease, anxiety depression and prediabetes as well as stage IIIa kidney disease as well as other comorbidities who presented with abdominal pain.  She reports he has a gastric ulcer and last EGD was in 2018 which showed 2 gastric ulcers which was was less than 1 similar with clean bases.  She was sent to the urgent care on 08/28/2022 for nausea and headache and diagnosed with a viral URI and given tramadol and Zofran.  She is again seen in the [place] [facility] ED on 08/30/2022 for headache and nausea vomiting for 5 days and was discharged after unremarkable workup.  She returned to urgent care on 09/24/2022 with abdominal pain and recurrent nausea vomiting and treated for GERD plus  upper URI with Reglan Flonase loratadine and Phenergan.  She reports that she has been having symptoms in June and she felt nauseated and having difficulty eating.  Given that she has been to multiple urgent cares and [facility] facility.  She states that she was given some medication and stopped almost 2 weeks ago but then around July 4 started having less pain and no nausea.  She had not been able to vomit but states that she is able to drink sips and then start again.  She has had poor p.o. intake since July 4.  She states that she has had diarrhea x 2 with no bloody stools.  She also thinks that she lost weight and was admitted for further evaluation of her abdominal pain and GI was consulted.  GI took her for an EGD and was found to have a nonbleeding gastric ulcer.  She also was found to have gastritis was biopsied and she was placed on PPI twice daily.  Diet is being advanced and she is continue to get IV fluid hydration and she stabilizes wanting to go home.  GI felt that she could go home and have outpatient follow-up on the biopsy result.  They also address her colon cancer screening at that time.  She is stable for discharge at this time will need to follow-up with PCP, medical oncology for her chronic leukocytosis as well as GI for further evaluation and biopsy results.  Assessment and Plan:  AKI on chronic kidney disease stage IIIa Metabolic Acidosis -Baseline Creatinine was around 1 creatinine was increased at this  point a half times compared to her baseline and presumed to have occurred in the last 7 days likely due to prerenal etiology from severe hydration -BUN/Cr Trend: Recent Labs  Lab 09/28/22 0758 09/29/22 0356 09/30/22 0055  BUN 44* 19 25*  CREATININE 1.94* 1.20* 1.32*  -Metabolic acidosis likely starvation ketosis in the setting of refractory nausea and she is continue to be hydrated -Received IV fluid hydration prior to discharge -Avoid Nephrotoxic Medications, Contrast  Dyes, Hypotension and Dehydration to Ensure Adequate Renal Perfusion and will need to Renally Adjust Meds -Continue to Monitor and Trend Renal Function carefully and CMP within 1 week  Epigastric Pain with Nausea the setting of nonbleeding gastric ulcer -Has Recurrent Epigastric Pain with Nausea with concern for recurrent ulcer(s) -Has a Hx of Gastric Ulcers in 2018 -Has been taking Mobic as it is listed on MAR -Received a dose of IV Flagyl and Rocephin empirically in the ED but will hold further antibiotics at this time -Started on Protonix IV twice daily and changed to po for discharge -Was made n.p.o. and diet was advanced by GI regular diet after EGD -WBC Trend: Recent Labs  Lab 09/28/22 0758 09/29/22 0356 09/30/22 0055  WBC 14.6* 13.4* 14.1*  -EGD done and showed that the Z-line was regular at 38 cm from the incisors with no gross lesions in the entire esophagus.  Nonbleeding gastric ulcer with no stigmata of bleeding and a single mucosal papule found in the stomach that was biopsied along with gastritis that was biopsied. -Continue twice daily PPI and following up on the cultures -GI recommends resuming the diet and recommending pantoprazole 40 mg p.o. twice daily and awaiting pathology results with recommendation for no NSAIDs including ibuprofen, naproxen; pathology results can be followed in the outpatient setting  Leukocytosis -In the setting of dehydration and nausea vomiting however this appears to be extremely chronic and has not been normal in any of our records -See above for trend -Continue monitor for signs and symptoms of infection -Repeat CBC within 1 week and will need follow-up with Dr. Leonides Schanz in outpatient setting  Thrombocytosis -Platelet Count Trend: Recent Labs  Lab 09/28/22 0758 09/29/22 0356 09/30/22 0055  PLT 621* 489* 454*  -Continue to Monitor and Trend and repeat CBC in the AM  HTN -Continue amlodipine but hold the benazepril component of the  lateral -Continue to hold hydrochlorothiazide -Continue with IV hydralazine as needed -Continue monitor blood pressures per protocol -Last blood pressure reading was 115/66  Anxiety/Depression/Insomnia -C/w Home Meds with mirtazapine 45 mg p.o. nightly, pregabalin 75 mg p.o. twice daily, temazepam 30 mg p.o. nightly as needed -Holding Belsomra   Normocytic Anemia -Hgb/Hct trend: Recent Labs  Lab 09/28/22 0758 09/29/22 0356 09/30/22 0055  HGB 14.4 11.5* 10.7*  HCT 44.9 35.5* 32.7*  MCV 85.0 83.7 83.4  -Check Anemia Panel in the outpatient setting -Continue to Monitor for S/Sx of Bleeding; No overt bleeding noted -Repeat CBC in the AM  Obesity -Complicates overall prognosis and care -Estimated body mass index is 31.18 kg/m as calculated from the following:   Height as of this encounter: 5\' 3"  (1.6 m).   Weight as of this encounter: 79.8 kg.  -Weight Loss and Dietary Counseling given  Consultants: Gastroenterology Procedures performed: EGD Disposition: Home Diet recommendation:  Cardiac diet DISCHARGE MEDICATION: Allergies as of 09/30/2022       Reactions   Hydroxyzine Other (See Comments)   Palpitations and jitteriness    Ambien [zolpidem Tartrate] Other (See Comments)  Jitteriness, nervousness, abdominal pain        Medication List     STOP taking these medications    amLODipine-benazepril 10-40 MG capsule Commonly known as: LOTREL   esomeprazole 40 MG capsule Commonly known as: NEXIUM   hydrochlorothiazide 25 MG tablet Commonly known as: HYDRODIURIL   meloxicam 15 MG tablet Commonly known as: MOBIC       TAKE these medications    acetaminophen 325 MG tablet Commonly known as: TYLENOL Take 2 tablets (650 mg total) by mouth every 6 (six) hours as needed for mild pain (or Fever >/= 101). What changed:  medication strength how much to take reasons to take this   amLODipine 10 MG tablet Commonly known as: NORVASC Take 1 tablet (10 mg total)  by mouth daily. Start taking on: October 01, 2022   Belsomra 20 MG Tabs Generic drug: Suvorexant Take 1 tablet (20 mg total) by mouth at bedtime.   loratadine 10 MG tablet Commonly known as: CLARITIN Take 1 tablet (10 mg total) by mouth daily as needed for allergies.   metoCLOPramide 10 MG tablet Commonly known as: REGLAN Take 1 tablet (10 mg total) by mouth every 6 (six) hours.   mirtazapine 45 MG tablet Commonly known as: REMERON Take 1 tablet (45 mg total) by mouth at bedtime.   pantoprazole 40 MG tablet Commonly known as: Protonix Take 1 tablet (40 mg total) by mouth 2 (two) times daily.   polyethylene glycol 17 g packet Commonly known as: MIRALAX / GLYCOLAX Take 17 g by mouth daily as needed for mild constipation.   pregabalin 75 MG capsule Commonly known as: LYRICA Take 75 mg by mouth 2 (two) times daily.   temazepam 30 MG capsule Commonly known as: RESTORIL Take 1 capsule (30 mg total) by mouth at bedtime as needed.        Follow-up Information     [name], MD. Call.   Specialty: Family Medicine Why: Follow up within 1-2 weeks Contact information: [address] [place] [address] [phone]         Napoleon Form, MD Follow up.   Specialty: Gastroenterology Why: Follow up with GI within 1-2 weeks Contact information: [address] [place] [address] [phone]         Jaci Standard, MD Follow up.   Specialty: Hematology and Oncology Why: Follow up with Hematology within 1-2 weeks for outpatient evaluation of Chronic Leukocytosis Contact information: [address] [place] [address] [phone]                Discharge Exam: Filed Weights   09/28/22 0756  Weight: 79.8 kg   Vitals:   09/30/22 0454 09/30/22 0807  BP: (!) 88/55 110/63  Pulse: 72 87  Resp: 18   Temp: 98 F (36.7 C) 98.1 F (36.7 C)  SpO2: 96% 97%   Examination: Physical Exam:  Constitutional: WN/WD  obese African-American female in no acute distress Respiratory: Diminished to auscultation bilaterally, no wheezing, rales, rhonchi or crackles. Normal respiratory effort and patient is not tachypenic. No accessory muscle use.  Unlabored breathing Cardiovascular: RRR, no murmurs / rubs / gallops. S1 and S2 auscultated. No extremity edema. Abdomen: Soft, non-tender, distended secondary to body habitus bowel sounds positive.  GU: Deferred. Musculoskeletal: No clubbing / cyanosis of digits/nails. No joint deformity upper and lower extremities.  Skin: No rashes, lesions, ulcers on limited skin evaluation but has a skin type II around her neck  of the formalin necklace. No induration; Warm and dry.  Neurologic: CN 2-12 grossly intact with no focal deficits. Romberg sign and cerebellar reflexes not assessed.  Psychiatric: Normal judgment and insight. Alert and oriented x 3. Normal mood and appropriate affect.    Condition at discharge: stable  The results of significant diagnostics from this hospitalization (including imaging, microbiology, ancillary and laboratory) are listed below for reference.   Imaging Studies: CT ABDOMEN PELVIS W CONTRAST  Result Date: 09/28/2022 CLINICAL DATA:  Abdominal pain and nausea. EXAM: CT ABDOMEN AND PELVIS WITH CONTRAST TECHNIQUE: Multidetector CT imaging of the abdomen and pelvis was performed using the standard protocol following bolus administration of intravenous contrast. RADIATION DOSE REDUCTION: This exam was performed according to the departmental dose-optimization program which includes automated exposure control, adjustment of the mA and/or kV according to patient size and/or use of iterative reconstruction technique. CONTRAST:  65mL OMNIPAQUE IOHEXOL 350 MG/ML SOLN COMPARISON:  CT abdomen pelvis dated Jul 31, 2016. FINDINGS: Lower chest: No acute abnormality. Hepatobiliary: No focal liver abnormality is seen. Status post cholecystectomy. Unchanged mildly  prominent common bile duct due to reservoir effect. Pancreas: Unremarkable. No pancreatic ductal dilatation or surrounding inflammatory changes. Spleen: Normal in size without focal abnormality. Adrenals/Urinary Tract: Unchanged 1.7 cm left adrenal adenoma. No follow-up imaging is recommended. Normal right adrenal gland. Bilateral renal simple cysts again noted. No follow-up imaging is recommended. No renal calculi or hydronephrosis. The bladder is unremarkable. Stomach/Bowel: Stomach is within normal limits. Appendix appears normal. No evidence of bowel wall thickening, distention, or inflammatory changes. Left-sided colonic diverticulosis. Vascular/Lymphatic: Aortic atherosclerosis. No enlarged abdominal or pelvic lymph nodes. Reproductive: Status post hysterectomy. No adnexal masses. Other: Small fat containing midline supraumbilical hernia has slightly increased in size. No free fluid or pneumoperitoneum. Musculoskeletal: No acute or significant osseous findings. IMPRESSION: 1. No acute intra-abdominal process. 2.  Aortic Atherosclerosis (ICD10-I70.0). Electronically Signed   By: Obie Dredge M.D.   On: 09/28/2022 10:56   DG Chest Port 1 View  Result Date: 09/28/2022 CLINICAL DATA:  57 year old female with possible sepsis. EXAM: PORTABLE CHEST 1 VIEW COMPARISON:  Chest x-ray 08/30/2022. FINDINGS: Lung volumes are normal. No consolidative airspace disease. No pleural effusions. No pneumothorax. No pulmonary nodule or mass noted. Pulmonary vasculature and the cardiomediastinal silhouette are within normal limits. IMPRESSION: No radiographic evidence of acute cardiopulmonary disease. Electronically Signed   By: Trudie Reed M.D.   On: 09/28/2022 08:43    Microbiology: Results for orders placed or performed during the hospital encounter of 09/28/22  Resp panel by RT-PCR (RSV, Flu A&B, Covid) Anterior Nasal Swab     Status: None   Collection Time: 09/28/22  8:04 AM   Specimen: Anterior Nasal Swab   Result Value Ref Range Status   SARS Coronavirus 2 by RT PCR NEGATIVE NEGATIVE Final   Influenza A by PCR NEGATIVE NEGATIVE Final   Influenza B by PCR NEGATIVE NEGATIVE Final    Comment: (NOTE) The Xpert Xpress SARS-CoV-2/FLU/RSV plus assay is intended as an aid in the diagnosis of influenza from Nasopharyngeal swab specimens and should not be used as a sole basis for treatment. Nasal washings and aspirates are unacceptable for Xpert Xpress SARS-CoV-2/FLU/RSV testing.  Fact Sheet for Patients: BloggerCourse.com  Fact Sheet for Healthcare Providers: SeriousBroker.it  This test is not yet approved or cleared by the [place] FDA and has been authorized for detection and/or diagnosis of SARS-CoV-2 by FDA under an Emergency Use Authorization (EUA). This EUA will remain in effect (  meaning this test can be used) for the duration of the COVID-19 declaration under Section 564(b)(1) of the Act, 21 U.S.C. section 360bbb-3(b)(1), unless the authorization is terminated or revoked.     Resp Syncytial Virus by PCR NEGATIVE NEGATIVE Final    Comment: (NOTE) Fact Sheet for Patients: BloggerCourse.com  Fact Sheet for Healthcare Providers: SeriousBroker.it  This test is not yet approved or cleared by the [place] FDA and has been authorized for detection and/or diagnosis of SARS-CoV-2 by FDA under an Emergency Use Authorization (EUA). This EUA will remain in effect (meaning this test can be used) for the duration of the COVID-19 declaration under Section 564(b)(1) of the Act, 21 U.S.C. section 360bbb-3(b)(1), unless the authorization is terminated or revoked.  Performed at [facility], [address], [place], [place] [address]   Blood Culture (routine x 2)     Status: None (Preliminary result)   Collection Time: 09/28/22  8:33 AM   Specimen: BLOOD LEFT ARM  Result Value  Ref Range Status   Specimen Description BLOOD LEFT ARM  Final   Special Requests   Final    BOTTLES DRAWN AEROBIC AND ANAEROBIC Blood Culture results may not be optimal due to an inadequate volume of blood received in culture bottles   Culture   Final    NO GROWTH 2 DAYS Performed at [facility], [address] [place], [place] [address]    Report Status PENDING  Incomplete  Blood Culture (routine x 2)     Status: None (Preliminary result)   Collection Time: 09/28/22  8:38 AM   Specimen: BLOOD RIGHT HAND  Result Value Ref Range Status   Specimen Description BLOOD RIGHT HAND  Final   Special Requests   Final    BOTTLES DRAWN AEROBIC AND ANAEROBIC Blood Culture results may not be optimal due to an inadequate volume of blood received in culture bottles   Culture   Final    NO GROWTH 2 DAYS Performed at [facility], [address], [place], [place] [address]    Report Status PENDING  Incomplete   Labs: CBC: Recent Labs  Lab 09/28/22 0758 09/29/22 0356 09/30/22 0055  WBC 14.6* 13.4* 14.1*  NEUTROABS 9.4*  --  8.0*  HGB 14.4 11.5* 10.7*  HCT 44.9 35.5* 32.7*  MCV 85.0 83.7 83.4  PLT 621* 489* 454*   Basic Metabolic Panel: Recent Labs  Lab 09/28/22 0758 09/29/22 0356 09/30/22 0055  NA 136 140 139  K 4.3 4.0 3.8  CL 110 107 112*  CO2 13* 19* 21*  GLUCOSE 127* 97 132*  BUN 44* 19 25*  CREATININE 1.94* 1.20* 1.32*  CALCIUM 9.8 9.4 8.6*  MG  --   --  1.5*  PHOS  --   --  2.5   Liver Function Tests: Recent Labs  Lab 09/28/22 0758 09/29/22 0356 09/30/22 0055  AST 18 14* 13*  ALT 21 17 15   ALKPHOS 57 42 44  BILITOT 0.8 0.7 0.5  PROT 8.6* 6.5 6.4*  ALBUMIN 4.6 3.6 3.1*   CBG: No results for input(s): "GLUCAP" in the last 168 hours.  Discharge time spent: greater than 30 minutes.  Signed: Marguerita Merles, DO [facility] 09/30/2022

## 2022-10-01 ENCOUNTER — Telehealth: Payer: Self-pay | Admitting: Gastroenterology

## 2022-10-01 ENCOUNTER — Telehealth: Payer: Self-pay | Admitting: *Deleted

## 2022-10-01 NOTE — Telephone Encounter (Signed)
Inbound call from patient stating that she had a procedure with Dr. Lavon Paganini on 7/15 at [facility] and Dr. Lavon Paganini wanted her to follow up within 1 to 2 weeks. Patient was scheduled for follow up appointment on 9/20 at 2:00 with Hyacinth Meeker but stated that she needed to be seen before them. Requesting a call back to discuss. Please advise.

## 2022-10-01 NOTE — Transitions of Care (Post Inpatient/ED Visit) (Signed)
10/01/2022  Name: Natalie Edwards MRN: [id number] DOB: Nov 27, 1965  Today's TOC FU Call Status: Today's TOC FU Call Status:: Successful TOC FU Call Competed TOC FU Call Complete Date: 10/01/22  Transition Care Management Follow-up Telephone Call Date of Discharge: 09/20/22 Discharge Facility: [facility]) Type of Discharge: Inpatient Admission Primary Inpatient Discharge Diagnosis:: Acute kidney injury How have you been since you were released from the hospital?: Better Any questions or concerns?: No  Items Reviewed: Did you receive and understand the discharge instructions provided?: Yes Medications obtained,verified, and reconciled?: Yes (Medications Reviewed) Any new allergies since your discharge?: No Dietary orders reviewed?: No Do you have support at home?: Yes People in Home: other relative(s) Name of Support/Comfort Primary Source: [name]  Medications Reviewed Today: Medications Reviewed Today     Reviewed by Luella Cook, RN (Case Manager) on 10/01/22 at 1639  Med List Status: <None>   Medication Order Taking? Sig Documenting Provider Last Dose Status Informant  acetaminophen (TYLENOL) 325 MG tablet [id number] Yes Take 2 tablets (650 mg total) by mouth every 6 (six) hours as needed for mild pain (or Fever >/= 101). Sheikh, Omair Rapelje, DO Taking Active   amLODipine (NORVASC) 10 MG tablet [id number] Yes Take 1 tablet (10 mg total) by mouth daily. Sheikh, Omair Latif, DO Taking Active   BELSOMRA 20 MG TABS [id number] Yes Take 1 tablet (20 mg total) by mouth at bedtime. Tomma Lightning, MD Taking Active Self, Pharmacy Records  loratadine (CLARITIN) 10 MG tablet [id number] Yes Take 1 tablet (10 mg total) by mouth daily as needed for allergies. Bing Neighbors, NP Taking Active Self, Pharmacy Records  metoCLOPramide (REGLAN) 10 MG tablet [id number] Yes Take 1 tablet (10 mg total) by mouth every 6 (six) hours. Bing Neighbors, NP Taking Active Self, Pharmacy Records   mirtazapine (REMERON) 45 MG tablet [id number] Yes Take 1 tablet (45 mg total) by mouth at bedtime. [name], MD Taking Active Self, Pharmacy Records  pantoprazole (PROTONIX) 40 MG tablet [id number] Yes Take 1 tablet (40 mg total) by mouth 2 (two) times daily. Sheikh, Omair Venedocia, DO Taking Active   polyethylene glycol (MIRALAX / GLYCOLAX) 17 g packet [id number] Yes Take 17 g by mouth daily as needed for mild constipation. Sheikh, Omair Deerfield, DO Taking Active   pregabalin (LYRICA) 75 MG capsule [id number] Yes Take 75 mg by mouth 2 (two) times daily. [provider] Taking Active Self, Pharmacy Records  temazepam (RESTORIL) 30 MG capsule [id number] Yes Take 1 capsule (30 mg total) by mouth at bedtime as needed. Tomma Lightning, MD Taking Active Self, Pharmacy Records            Home Care and Equipment/Supplies: Were Home Health Services Ordered?: NA Any new equipment or medical supplies ordered?: NA  Functional Questionnaire: Do you need assistance with bathing/showering or dressing?: No Do you need assistance with meal preparation?: No Do you need assistance with eating?: No Do you have difficulty maintaining continence: No Do you need assistance with getting out of bed/getting out of a chair/moving?: No Do you have difficulty managing or taking your medications?: No  Follow up appointments reviewed: PCP Follow-up appointment confirmed?: Yes Date of PCP follow-up appointment?: 10/08/22 Follow-up Provider: [name] Specialist [facility] Follow-up appointment confirmed?: No Reason Specialist Follow-Up Not Confirmed: Patient has Specialist Provider Number and will Call for Appointment (Patient has called [facility] to get an earlier appt. Patient has also called Dr Leonides Schanz office to get an appt) Do you need transportation  to your follow-up appointment?: No Do you understand care options if your condition(s) worsen?: Yes-patient verbalized understanding  SDOH Interventions  Today    Flowsheet Row Most Recent Value  SDOH Interventions   Food Insecurity Interventions Intervention Not Indicated  Transportation Interventions Intervention Not Indicated, Patient Resources (Friends/Family)      Interventions Today    Flowsheet Row Most Recent Value  General Interventions   General Interventions Discussed/Reviewed General Interventions Discussed, General Interventions Reviewed, Doctor Visits  Pharmacy Interventions   Pharmacy Dicussed/Reviewed Pharmacy Topics Discussed      TOC Interventions Today    Flowsheet Row Most Recent Value  TOC Interventions   TOC Interventions Discussed/Reviewed TOC Interventions Discussed, TOC Interventions Reviewed  [Patient stated they wanted her to get a bone marrow test. RN discussed the bone marrow aspiration process.]      Gean Maidens BSN RN [facility] [phone]

## 2022-10-02 ENCOUNTER — Telehealth: Payer: Self-pay | Admitting: Hematology and Oncology

## 2022-10-03 LAB — CULTURE, BLOOD (ROUTINE X 2)
Culture: NO GROWTH
Culture: NO GROWTH

## 2022-10-08 ENCOUNTER — Ambulatory Visit: Payer: Self-pay | Admitting: *Deleted

## 2022-10-08 ENCOUNTER — Ambulatory Visit (INDEPENDENT_AMBULATORY_CARE_PROVIDER_SITE_OTHER): Payer: 59 | Admitting: Family Medicine

## 2022-10-08 VITALS — BP 134/85 | HR 105 | Temp 98.1°F | Resp 16 | Wt 178.8 lb

## 2022-10-08 DIAGNOSIS — Z09 Encounter for follow-up examination after completed treatment for conditions other than malignant neoplasm: Secondary | ICD-10-CM

## 2022-10-08 DIAGNOSIS — K257 Chronic gastric ulcer without hemorrhage or perforation: Secondary | ICD-10-CM | POA: Diagnosis not present

## 2022-10-08 DIAGNOSIS — F32A Depression, unspecified: Secondary | ICD-10-CM | POA: Diagnosis not present

## 2022-10-08 DIAGNOSIS — D75839 Thrombocytosis, unspecified: Secondary | ICD-10-CM | POA: Diagnosis not present

## 2022-10-08 DIAGNOSIS — N179 Acute kidney failure, unspecified: Secondary | ICD-10-CM | POA: Diagnosis not present

## 2022-10-08 DIAGNOSIS — I10 Essential (primary) hypertension: Secondary | ICD-10-CM

## 2022-10-08 DIAGNOSIS — F419 Anxiety disorder, unspecified: Secondary | ICD-10-CM

## 2022-10-08 NOTE — Telephone Encounter (Signed)
She does not need to see GI in 1 to 2 weeks, needs to follow-up with her PMD, please keep the scheduled follow-up visit in September with Hyacinth Meeker.  Thank you

## 2022-10-08 NOTE — Progress Notes (Signed)
Patient is here for HFU .  Patient request that provider take her out of work for [employer].

## 2022-10-08 NOTE — Progress Notes (Signed)
New Patient Office Visit  Subjective    Patient ID: Natalie Edwards, female    DOB: 1965-12-16  Age: 57 y.o. MRN: [id number]  CC:  Chief Complaint  Patient presents with   Follow-up    HFU    HPI Natalie Edwards presents for hospital follow up were she was admitted with AKI as well as review of chronic med issues.  . She reports improvement since discharge.  [facility]   Outpatient Encounter Medications as of 10/08/2022  Medication Sig   FLUoxetine (PROZAC) 20 MG capsule Take 20 mg by mouth daily.   hydrOXYzine (ATARAX) 25 MG tablet Take 25 mg by mouth 2 (two) times daily as needed.   acetaminophen (TYLENOL) 325 MG tablet Take 2 tablets (650 mg total) by mouth every 6 (six) hours as needed for mild pain (or Fever >/= 101).   amLODipine (NORVASC) 10 MG tablet Take 1 tablet (10 mg total) by mouth daily.   BELSOMRA 20 MG TABS Take 1 tablet (20 mg total) by mouth at bedtime.   loratadine (CLARITIN) 10 MG tablet Take 1 tablet (10 mg total) by mouth daily as needed for allergies.   metoCLOPramide (REGLAN) 10 MG tablet Take 1 tablet (10 mg total) by mouth every 6 (six) hours.   mirtazapine (REMERON) 45 MG tablet Take 1 tablet (45 mg total) by mouth at bedtime.   pantoprazole (PROTONIX) 40 MG tablet Take 1 tablet (40 mg total) by mouth 2 (two) times daily.   polyethylene glycol (MIRALAX / GLYCOLAX) 17 g packet Take 17 g by mouth daily as needed for mild constipation.   pregabalin (LYRICA) 75 MG capsule Take 75 mg by mouth 2 (two) times daily.   temazepam (RESTORIL) 30 MG capsule Take 1 capsule (30 mg total) by mouth at bedtime as needed.   No facility-administered encounter medications on file as of 10/08/2022.    Past Medical History:  Diagnosis Date   Allergy    Anemia    Diverticulosis    Gall bladder stones 1993   Gastric ulcer    GERD (gastroesophageal reflux disease)    Hypertension    Renal mass     Past Surgical History:  Procedure Laterality Date    ABDOMINAL HYSTERECTOMY     BIOPSY  09/29/2022   Procedure: BIOPSY;  Surgeon: Napoleon Form, MD;  Location: [facility];  Service: Gastroenterology;;   CHOLECYSTECTOMY     ESOPHAGOGASTRODUODENOSCOPY (EGD) WITH PROPOFOL N/A 09/29/2022   Procedure: ESOPHAGOGASTRODUODENOSCOPY (EGD) WITH PROPOFOL;  Surgeon: Napoleon Form, MD;  Location: [facility];  Service: Gastroenterology;  Laterality: N/A;    Family History  Problem Relation Age of Onset   Cancer Maternal Uncle        Lung   Cancer Maternal Uncle        Lung   Cancer Maternal Uncle        Lung   Headache Neg Hx        "I don't think so"   Migraines Neg Hx        "I don't think so"    Social History   Socioeconomic History   Marital status: Single    Spouse name: Not on file   Number of children: 3   Years of education: Not on file   Highest education level: High school graduate  Occupational History   Occupation: environmental services at [employer]  Tobacco Use   Smoking status: Every Day    Current packs/day: 0.50  Average packs/day: 0.5 packs/day for 25.0 years (12.5 ttl pk-yrs)    Types: Cigarettes   Smokeless tobacco: Never   Tobacco comments:    Pt tried to quit - helps with anxiety     1 pack last patient 3 days   Vaping Use   Vaping status: Never Used  Substance and Sexual Activity   Alcohol use: Yes    Comment: occasionally - last drink was July 4, 1 shot   Drug use: No   Sexual activity: Yes    Comment: hysterectomy  Other Topics Concern   Not on file  Social History Narrative   Lives at home in an apartment. Her mother lives with her.    Right handed   Caffeine: drinks approx. 36 oz of pepsi per day. Sometimes drinks 2 cups of coffee in a day as well.    Social Determinants of Health   Financial Resource Strain: High Risk (05/22/2022)   Overall Financial Resource Strain (CARDIA)    Difficulty of Paying Living Expenses: Very hard  Food Insecurity: No Food Insecurity (10/01/2022)    Hunger Vital Sign    Worried About Running Out of Food in the Last Year: Never true    Ran Out of Food in the Last Year: Never true  Transportation Needs: No Transportation Needs (10/01/2022)   PRAPARE - Administrator, Civil Service (Medical): No    Lack of Transportation (Non-Medical): No  Physical Activity: Sufficiently Active (05/22/2022)   Exercise Vital Sign    Days of Exercise per Week: 4 days    Minutes of Exercise per Session: 70 min  Stress: Stress Concern Present (05/22/2022)   [employer] of Occupational Health - Occupational Stress Questionnaire    Feeling of Stress : Very much  Social Connections: Moderately Isolated (05/22/2022)   Social Connection and Isolation Panel [NHANES]    Frequency of Communication with Friends and Family: More than three times a week    Frequency of Social Gatherings with Friends and Family: Never    Attends Religious Services: Never    Database administrator or Organizations: Yes    Attends Banker Meetings: Never    Marital Status: Never married  Intimate Partner Violence: Not At Risk (09/28/2022)   Humiliation, Afraid, Rape, and Kick questionnaire    Fear of Current or Ex-Partner: No    Emotionally Abused: No    Physically Abused: No    Sexually Abused: No    Review of Systems  All other systems reviewed and are negative.       Objective    BP 134/85   Pulse (!) 105   Temp 98.1 F (36.7 C) (Oral)   Resp 16   Wt 178 lb 12.8 oz (81.1 kg)   SpO2 93%   BMI 31.67 kg/m   Physical Exam Vitals and nursing note reviewed.  Constitutional:      General: She is not in acute distress. Cardiovascular:     Rate and Rhythm: Normal rate and regular rhythm.  Pulmonary:     Effort: Pulmonary effort is normal.     Breath sounds: Normal breath sounds.  Abdominal:     Palpations: Abdomen is soft.     Tenderness: There is no abdominal tenderness.  Neurological:     General: No focal deficit present.     Mental  Status: She is alert and oriented to person, place, and time.         Assessment & Plan:  1. Hospital discharge follow-up Imiiproving since discharge  2. AKI (acute kidney injury) (HCC) Monitoring labs ordered - CBC with Differential - CMP14+EGFR - Magnesium - Phosphorus  3. Thrombocytosis Monitoring labs ordered - CBC with Differential  4. Chronic gastric ulcer, unspecified whether gastric ulcer hemorrhage or perforation present Continue   5. Essential hypertension Appears stable. Continue   6. Anxiety and depression Management as per consultant  No follow-ups on file.   Tommie Raymond, MD

## 2022-10-08 NOTE — Telephone Encounter (Signed)
Spoke with the patient. Advised of the recommendations. Confirmed she is taking pantoprazole BID. She confirms she is able to eat, and has not had any new concerns. Encouraged to continue to advance diet as tolerated, continue medications as directed by her providers, and to call if she should fail to continue improving or acutely worsen.

## 2022-10-09 LAB — CMP14+EGFR
AST: 11 IU/L (ref 0–40)
Albumin: 4.2 g/dL (ref 3.8–4.9)
Alkaline Phosphatase: 66 IU/L (ref 44–121)
BUN/Creatinine Ratio: 17 (ref 9–23)
BUN: 17 mg/dL (ref 6–24)
Bilirubin Total: <0.2 mg/dL (ref 0.0–1.2)
CO2: 28 mmol/L (ref 20–29)
Calcium: 9.7 mg/dL (ref 8.7–10.2)
Chloride: 104 mmol/L (ref 96–106)
Globulin, Total: 2.7 g/dL (ref 1.5–4.5)
Glucose: 89 mg/dL (ref 70–99)
Potassium: 4.6 mmol/L (ref 3.5–5.2)
Sodium: 144 mmol/L (ref 134–144)
Total Protein: 6.9 g/dL (ref 6.0–8.5)

## 2022-10-09 LAB — CBC WITH DIFFERENTIAL/PLATELET
Basophils Absolute: 0.1 10*3/uL (ref 0.0–0.2)
Basos: 0 %
EOS (ABSOLUTE): 0.2 10*3/uL (ref 0.0–0.4)
Eos: 1 %
Hematocrit: 34.9 % (ref 34.0–46.6)
Hemoglobin: 11 g/dL — ABNORMAL LOW (ref 11.1–15.9)
Immature Grans (Abs): 0.1 10*3/uL (ref 0.0–0.1)
Lymphocytes Absolute: 5.3 10*3/uL — ABNORMAL HIGH (ref 0.7–3.1)
MCH: 27.2 pg (ref 26.6–33.0)
MCHC: 31.5 g/dL (ref 31.5–35.7)
MCV: 86 fL (ref 79–97)
Monocytes: 8 %
Neutrophils: 60 %
RDW: 15.9 % — ABNORMAL HIGH (ref 11.7–15.4)
WBC: 17.7 10*3/uL — ABNORMAL HIGH (ref 3.4–10.8)

## 2022-10-09 LAB — MAGNESIUM: Magnesium: 1.9 mg/dL (ref 1.6–2.3)

## 2022-10-09 LAB — PHOSPHORUS: Phosphorus: 3.6 mg/dL (ref 3.0–4.3)

## 2022-10-09 NOTE — Patient Instructions (Signed)
Visit Information  Thank you for taking time to visit with me today. Please don't hesitate to contact me if I can be of assistance to you.   Following are the goals we discussed today:   Goals Addressed             This Visit's Progress    Reduce stress related to financial strains        Activities and task to complete in order to accomplish goals.   Call/apply for SS Disability as you indicate is your plan now Follow up with housing resources discussed (sent you an email for resource/support to prevent your eviction) Call [facility] [phone] to schedule your counseling appointment/paperwork completed. Follow up on medical appointments: Hematology, GI, etc         Our next appointment is by telephone on 10/31/22   Please call the care guide team at [phone] if you need to cancel or reschedule your appointment.   If you are experiencing a Mental Health or Behavioral Health Crisis or need someone to talk to, please call the Suicide and Crisis Lifeline: 988 call 911   The patient verbalized understanding of instructions, educational materials, and care plan provided today and DECLINED offer to receive copy of patient instructions, educational materials, and care plan.   Telephone follow up appointment with care management team member scheduled for:10/31/22 The patient will call [facility] as advised to arrange counseling appointment.   Reece Levy, MSW, LCSW Clinical Social Worker [facility] [phone]

## 2022-10-09 NOTE — Patient Outreach (Signed)
  Care Coordination   Follow Up Visit Note   10/09/2022 Name: Natalie Edwards MRN: [id number] DOB: March 07, 1966  Natalie Edwards is a 57 y.o. year old female who sees Natalie Skeans, MD for primary care. I spoke with  Natalie Edwards by phone today.  What matters to the patients health and wellness today?  Was in the hospital.     Goals Addressed             This Visit's Progress    Reduce stress related to financial strains        Activities and task to complete in order to accomplish goals.   Call/apply for SS Disability as you indicate is your plan now Follow up with housing resources discussed (sent you an email for resource/support to prevent your eviction) Call [facility] [phone] to schedule your counseling appointment/paperwork completed. Follow up on medical appointments: Hematology, GI, etc         SDOH assessments and interventions completed:  Yes     Care Coordination Interventions:  Yes, provided  Interventions Today    Flowsheet Row Most Recent Value  Chronic Disease   Chronic disease during today's visit Other  [pt reports being hospitalized due to ulcers- plans to f/u with Hematologist and GI]  General Interventions   General Interventions Discussed/Reviewed [facility]  [Pt plans to apply for SS Disability]  Mental Health Interventions   Mental Health Discussed/Reviewed Mental Health Discussed, Mental Health Reviewed, Coping Strategies, Depression  [Pt to follow up with [facility] for counseling intake/visit]       Follow up plan: Follow up call scheduled for 10/31/22    Encounter Outcome:  Pt. Visit Completed

## 2022-10-10 ENCOUNTER — Other Ambulatory Visit: Payer: Self-pay | Admitting: Hematology and Oncology

## 2022-10-10 ENCOUNTER — Other Ambulatory Visit: Payer: Self-pay

## 2022-10-10 ENCOUNTER — Inpatient Hospital Stay: Payer: 59

## 2022-10-10 ENCOUNTER — Inpatient Hospital Stay: Payer: 59 | Attending: Hematology and Oncology | Admitting: Hematology and Oncology

## 2022-10-10 VITALS — BP 144/90 | HR 110 | Temp 97.7°F | Resp 17 | Wt 179.2 lb

## 2022-10-10 DIAGNOSIS — D75839 Thrombocytosis, unspecified: Secondary | ICD-10-CM

## 2022-10-10 DIAGNOSIS — F17210 Nicotine dependence, cigarettes, uncomplicated: Secondary | ICD-10-CM | POA: Diagnosis not present

## 2022-10-10 DIAGNOSIS — Z79899 Other long term (current) drug therapy: Secondary | ICD-10-CM | POA: Insufficient documentation

## 2022-10-10 DIAGNOSIS — D72825 Bandemia: Secondary | ICD-10-CM

## 2022-10-10 DIAGNOSIS — D72829 Elevated white blood cell count, unspecified: Secondary | ICD-10-CM | POA: Insufficient documentation

## 2022-10-10 DIAGNOSIS — D500 Iron deficiency anemia secondary to blood loss (chronic): Secondary | ICD-10-CM

## 2022-10-10 LAB — CBC WITH DIFFERENTIAL (CANCER CENTER ONLY)
Abs Immature Granulocytes: 0.13 10*3/uL — ABNORMAL HIGH (ref 0.00–0.07)
Basophils Absolute: 0.1 10*3/uL (ref 0.0–0.1)
Basophils Relative: 1 %
Eosinophils Absolute: 0.2 10*3/uL (ref 0.0–0.5)
Eosinophils Relative: 1 %
HCT: 35.7 % — ABNORMAL LOW (ref 36.0–46.0)
Hemoglobin: 12 g/dL (ref 12.0–15.0)
Immature Granulocytes: 1 %
Lymphocytes Relative: 31 %
Lymphs Abs: 4.9 10*3/uL — ABNORMAL HIGH (ref 0.7–4.0)
MCH: 28.4 pg (ref 26.0–34.0)
MCHC: 33.6 g/dL (ref 30.0–36.0)
MCV: 84.6 fL (ref 80.0–100.0)
Monocytes Absolute: 1 10*3/uL (ref 0.1–1.0)
Monocytes Relative: 6 %
Neutro Abs: 9.7 10*3/uL — ABNORMAL HIGH (ref 1.7–7.7)
Neutrophils Relative %: 60 %
Platelet Count: 451 10*3/uL — ABNORMAL HIGH (ref 150–400)
RBC: 4.22 MIL/uL (ref 3.87–5.11)
RDW: 18.2 % — ABNORMAL HIGH (ref 11.5–15.5)
WBC Count: 16 10*3/uL — ABNORMAL HIGH (ref 4.0–10.5)
nRBC: 0 % (ref 0.0–0.2)

## 2022-10-10 LAB — CMP (CANCER CENTER ONLY)
ALT: 15 U/L (ref 0–44)
AST: 12 U/L — ABNORMAL LOW (ref 15–41)
Albumin: 4.5 g/dL (ref 3.5–5.0)
Alkaline Phosphatase: 65 U/L (ref 38–126)
Anion gap: 7 (ref 5–15)
BUN: 21 mg/dL — ABNORMAL HIGH (ref 6–20)
CO2: 29 mmol/L (ref 22–32)
Calcium: 9.9 mg/dL (ref 8.9–10.3)
Chloride: 107 mmol/L (ref 98–111)
Creatinine: 1.24 mg/dL — ABNORMAL HIGH (ref 0.44–1.00)
GFR, Estimated: 51 mL/min — ABNORMAL LOW (ref >60)
Glucose, Bld: 88 mg/dL (ref 70–99)
Potassium: 4.1 mmol/L (ref 3.5–5.1)
Sodium: 143 mmol/L (ref 135–145)
Total Bilirubin: 0.2 mg/dL — ABNORMAL LOW (ref 0.3–1.2)
Total Protein: 7.9 g/dL (ref 6.5–8.1)

## 2022-10-10 LAB — IRON AND IRON BINDING CAPACITY (CC-WL,HP ONLY)
Iron: 32 ug/dL (ref 28–170)
Saturation Ratios: 7 % — ABNORMAL LOW (ref 10.4–31.8)
TIBC: 442 ug/dL (ref 250–450)
UIBC: 410 ug/dL (ref 148–442)

## 2022-10-10 LAB — RETIC PANEL
Immature Retic Fract: 25 % — ABNORMAL HIGH (ref 2.3–15.9)
RBC.: 4.26 MIL/uL (ref 3.87–5.11)
Retic Count, Absolute: 73.7 10*3/uL (ref 19.0–186.0)
Retic Ct Pct: 1.7 % (ref 0.4–3.1)
Reticulocyte Hemoglobin: 26.8 pg — ABNORMAL LOW (ref >27.9)

## 2022-10-10 LAB — FERRITIN: Ferritin: 32 ng/mL (ref 11–307)

## 2022-10-10 NOTE — Progress Notes (Unsigned)
[facility] [phone]   [phone]  PROGRESS NOTE  Edwards Care Team: [name], MD as PCP - General (Family Medicine) Buck Mam, LCSW as Social Worker  Hematological/Oncological History  #Leukocytosis #Thrombocytosis #Iron Deficiency 1) 09/09/2016: MPN testing with JAK2 reflex to MPL and CLR. This testing was negative for a mutation. BCR/ABL not tested.  2) 09/23/2016: establish care with Dr. Gweneth Dimitri 3) 06/03/2018: WBC 15.9 HB 12.4 plts 557 4) 07/28/2018: WBC 12.8 HB 11.3 plts 455. Sed rate 16 (nml). Feraheme ordered, but Edwards never received Natalie iron. .  5) 03/08/2019: Establish care with Dr. Leonides Schanz  6) 2020-10/10/2022: lost to follow up 7) 10/10/2022: presenting to re-establish care.   Interval History:  Natalie Edwards 57 y.o. female with medical history significant for iron deficiency anemia presents to re-establish care. She was last seen on 03/08/2019.   In Natalie interim since her last visit Natalie Edwards has been lost to follow-up.  She just had a hospitalization lasting from 09/28/2022 until 09/30/2022.  She was hospitalized with AKI on CKD with metabolic acidosis and is well as epigastric pain and was found to have a nonbleeding gastric ulcer.  During that hospitalization her leukocytosis and thrombocytosis was noted and she was referred back to hematology for further evaluation and management.  On exam today Natalie Edwards notes she continues to have poor energy and reports that she cannot walk from Natalie clinic room to Natalie lobby without getting out of breath.  She has not had any overt signs of bleeding with no dark stools, gum bleeding, or nosebleeds.  She reports that she has had a hysterectomy and does not have any menstrual bleeding or GYN bleeding.  She reports that she feels like she is eating well and does eat iron rich foods such as hamburgers and steak.  She endorses having lightheadedness and dizziness.  She is not currently taking any  iron supplements.  She reports that a pack of cigarettes would last for about 3-1/2 days, though on some bad days she will smoke a full pack.  She does drink alcohol on occasion.  She has had no issues with rash, itching, or joint pain.  Otherwise she denies any fevers, chills, sweats, nausea, vomiting or diarrhea.  A full 10 point ROS is listed below.  MEDICAL HISTORY:  Past Medical History:  Diagnosis Date   Allergy    Anemia    Diverticulosis    Gall bladder stones 1993   Gastric ulcer    GERD (gastroesophageal reflux disease)    Hypertension    Renal mass     SURGICAL HISTORY: Past Surgical History:  Procedure Laterality Date   ABDOMINAL HYSTERECTOMY     BIOPSY  09/29/2022   Procedure: BIOPSY;  Surgeon: Napoleon Form, MD;  Location: [facility];  Service: Gastroenterology;;   CHOLECYSTECTOMY     ESOPHAGOGASTRODUODENOSCOPY (EGD) WITH PROPOFOL N/A 09/29/2022   Procedure: ESOPHAGOGASTRODUODENOSCOPY (EGD) WITH PROPOFOL;  Surgeon: Napoleon Form, MD;  Location: [facility];  Service: Gastroenterology;  Laterality: N/A;    SOCIAL HISTORY: Social History   Socioeconomic History   Marital status: Single    Spouse name: Not on file   Number of children: 3   Years of education: Not on file   Highest education level: High school graduate  Occupational History   Occupation: environmental services at [employer]  Tobacco Use   Smoking status: Every Day    Current packs/day: 0.50    Average packs/day: 0.5 packs/day for 25.0  years (12.5 ttl pk-yrs)    Types: Cigarettes   Smokeless tobacco: Never   Tobacco comments:    Pt tried to quit - helps with anxiety     1 pack last Edwards 3 days   Vaping Use   Vaping status: Never Used  Substance and Sexual Activity   Alcohol use: Yes    Comment: occasionally - last drink was July 4, 1 shot   Drug use: No   Sexual activity: Yes    Comment: hysterectomy  Other Topics Concern   Not on file  Social History Narrative   Lives  at home in an apartment. Her mother lives with her.    Right handed   Caffeine: drinks approx. 36 oz of pepsi per day. Sometimes drinks 2 cups of coffee in a day as well.    Social Determinants of Health   Financial Resource Strain: High Risk (05/22/2022)   Overall Financial Resource Strain (CARDIA)    Difficulty of Paying Living Expenses: Very hard  Food Insecurity: No Food Insecurity (10/01/2022)   Hunger Vital Sign    Worried About Running Out of Food in Natalie Last Year: Never true    Ran Out of Food in Natalie Last Year: Never true  Transportation Needs: No Transportation Needs (10/01/2022)   PRAPARE - Administrator, Civil Service (Medical): No    Lack of Transportation (Non-Medical): No  Physical Activity: Sufficiently Active (05/22/2022)   Exercise Vital Sign    Days of Exercise per Week: 4 days    Minutes of Exercise per Session: 70 min  Stress: Stress Concern Present (05/22/2022)   [employer] of Occupational Health - Occupational Stress Questionnaire    Feeling of Stress : Very much  Social Connections: Moderately Isolated (05/22/2022)   Social Connection and Isolation Panel [NHANES]    Frequency of Communication with Friends and Family: More than three times a week    Frequency of Social Gatherings with Friends and Family: Never    Attends Religious Services: Never    Database administrator or Organizations: Yes    Attends Banker Meetings: Never    Marital Status: Never married  Intimate Partner Violence: Not At Risk (09/28/2022)   Humiliation, Afraid, Rape, and Kick questionnaire    Fear of Current or Ex-Partner: No    Emotionally Abused: No    Physically Abused: No    Sexually Abused: No    FAMILY HISTORY: Family History  Problem Relation Age of Onset   Cancer Maternal Uncle        Lung   Cancer Maternal Uncle        Lung   Cancer Maternal Uncle        Lung   Headache Neg Hx        "I don't think so"   Migraines Neg Hx        "I don't  think so"    ALLERGIES:  is allergic to hydroxyzine and ambien [zolpidem tartrate].  MEDICATIONS:  Current Outpatient Medications  Medication Sig Dispense Refill   acetaminophen (TYLENOL) 325 MG tablet Take 2 tablets (650 mg total) by mouth every 6 (six) hours as needed for mild pain (or Fever >/= 101). 20 tablet 0   amLODipine (NORVASC) 10 MG tablet Take 1 tablet (10 mg total) by mouth daily. 30 tablet 0   BELSOMRA 20 MG TABS Take 1 tablet (20 mg total) by mouth at bedtime. 30 tablet 2   FLUoxetine (PROZAC) 20 MG  capsule Take 20 mg by mouth daily.     hydrOXYzine (ATARAX) 25 MG tablet Take 25 mg by mouth 2 (two) times daily as needed.     loratadine (CLARITIN) 10 MG tablet Take 1 tablet (10 mg total) by mouth daily as needed for allergies. 30 tablet 0   metoCLOPramide (REGLAN) 10 MG tablet Take 1 tablet (10 mg total) by mouth every 6 (six) hours. 30 tablet 0   mirtazapine (REMERON) 45 MG tablet Take 1 tablet (45 mg total) by mouth at bedtime. 90 tablet 1   pantoprazole (PROTONIX) 40 MG tablet Take 1 tablet (40 mg total) by mouth 2 (two) times daily. 60 tablet 0   polyethylene glycol (MIRALAX / GLYCOLAX) 17 g packet Take 17 g by mouth daily as needed for mild constipation. 14 each 0   pregabalin (LYRICA) 75 MG capsule Take 75 mg by mouth 2 (two) times daily.     temazepam (RESTORIL) 30 MG capsule Take 1 capsule (30 mg total) by mouth at bedtime as needed. 30 capsule 3   No current facility-administered medications for this visit.    REVIEW OF SYSTEMS:   Constitutional: ( - ) fevers, ( - )  chills , ( - ) night sweats (+) insomnia Eyes: ( - ) blurriness of vision, ( - ) double vision, ( - ) watery eyes Ears, nose, mouth, throat, and face: ( - ) mucositis, ( - ) sore throat Respiratory: ( - ) cough, ( - ) dyspnea, ( - ) wheezes Cardiovascular: ( - ) palpitation, ( - ) chest discomfort, ( - ) lower extremity swelling Gastrointestinal:  ( - ) nausea, ( - ) heartburn, ( - ) change in bowel  habits Skin: ( - ) abnormal skin rashes Lymphatics: ( - ) new lymphadenopathy, ( - ) easy bruising Neurological: ( - ) numbness, ( - ) tingling, ( - ) new weaknesses Behavioral/Psych: ( - ) mood change, ( - ) new changes  All other systems were reviewed with Natalie Edwards and are negative.  PHYSICAL EXAMINATION: ECOG PERFORMANCE STATUS: 0 - Asymptomatic  Vitals:   10/10/22 1046  BP: (!) 144/90  Pulse: (!) 110  Resp: 17  Temp: 97.7 F (36.5 C)  SpO2: 98%    Filed Weights   10/10/22 1046  Weight: 179 lb 3.2 oz (81.3 kg)     GENERAL: well appearing middle aged [place] American female in NAD  SKIN: skin color, texture, turgor are normal, no rashes or significant lesions EYES: conjunctiva are pink and non-injected, sclera clear LUNGS: clear to auscultation and percussion with normal breathing effort HEART: regular rate & rhythm and no murmurs and no lower extremity edema ABDOMEN: soft, non-tender, non-distended, normal bowel sounds Musculoskeletal: no cyanosis of digits and no clubbing  PSYCH: alert & oriented x 3, fluent speech NEURO: no focal motor/sensory deficits  LABORATORY DATA:  I have reviewed Natalie data as listed Lab Results  Component Value Date   WBC 16.0 (H) 10/10/2022   HGB 12.0 10/10/2022   HCT 35.7 (L) 10/10/2022   MCV 84.6 10/10/2022   PLT 451 (H) 10/10/2022   NEUTROABS 9.7 (H) 10/10/2022    BLOOD FILM:  Review of Natalie peripheral blood smear showed normal appearing white cells with neutrophils that were appropriately lobated and granulated. There was no predominance of bi-lobed or hyper-segmented neutrophils appreciated. No Dohle bodies were noted. There was no left shifting, immature forms or blasts noted. Lymphocytes remain normal in size without any predominance of large granular  lymphocytes. Red cells show no anisopoikilocytosis, macrocytes , microcytes or polychromasia. There were target cells, but no schistocytes,echinocytes, acanthocytes, dacrocytes, or  stomatocytes.There was no rouleaux formation, nucleated red cells, or intra-cellular inclusions noted. Natalie platelets are normal in size, shape, and color without any clumping evident.  RADIOGRAPHIC STUDIES: None relevant to review.  CT ABDOMEN PELVIS W CONTRAST  Result Date: 09/28/2022 CLINICAL DATA:  Abdominal pain and nausea. EXAM: CT ABDOMEN AND PELVIS WITH CONTRAST TECHNIQUE: Multidetector CT imaging of Natalie abdomen and pelvis was performed using Natalie standard protocol following bolus administration of intravenous contrast. RADIATION DOSE REDUCTION: This exam was performed according to Natalie departmental dose-optimization program which includes automated exposure control, adjustment of the mA and/or kV according to Edwards size and/or use of iterative reconstruction technique. CONTRAST:  65mL OMNIPAQUE IOHEXOL 350 MG/ML SOLN COMPARISON:  CT abdomen pelvis dated Jul 31, 2016. FINDINGS: Lower chest: No acute abnormality. Hepatobiliary: No focal liver abnormality is seen. Status post cholecystectomy. Unchanged mildly prominent common bile duct due to reservoir effect. Pancreas: Unremarkable. No pancreatic ductal dilatation or surrounding inflammatory changes. Spleen: Normal in size without focal abnormality. Adrenals/Urinary Tract: Unchanged 1.7 cm left adrenal adenoma. No follow-up imaging is recommended. Normal right adrenal gland. Bilateral renal simple cysts again noted. No follow-up imaging is recommended. No renal calculi or hydronephrosis. Natalie bladder is unremarkable. Stomach/Bowel: Stomach is within normal limits. Appendix appears normal. No evidence of bowel wall thickening, distention, or inflammatory changes. Left-sided colonic diverticulosis. Vascular/Lymphatic: Aortic atherosclerosis. No enlarged abdominal or pelvic lymph nodes. Reproductive: Status post hysterectomy. No adnexal masses. Other: Small fat containing midline supraumbilical hernia has slightly increased in size. No free fluid or  pneumoperitoneum. Musculoskeletal: No acute or significant osseous findings. IMPRESSION: 1. No acute intra-abdominal process. 2.  Aortic Atherosclerosis (ICD10-I70.0). Electronically Signed   By: Obie Dredge M.D.   On: 09/28/2022 10:56   DG Chest Port 1 View  Result Date: 09/28/2022 CLINICAL DATA:  57 year old female with possible sepsis. EXAM: PORTABLE CHEST 1 VIEW COMPARISON:  Chest x-ray 08/30/2022. FINDINGS: Lung volumes are normal. No consolidative airspace disease. No pleural effusions. No pneumothorax. No pulmonary nodule or mass noted. Pulmonary vasculature and Natalie cardiomediastinal silhouette are within normal limits. IMPRESSION: No radiographic evidence of acute cardiopulmonary disease. Electronically Signed   By: Trudie Reed M.D.   On: 09/28/2022 08:43    ASSESSMENT & PLAN Natalie Edwards 57 y.o. female with medical history significant for iron deficiency anemia presents for a follow up visit.  After review of Natalie labs and discussion with Natalie Edwards her findings are most consistent with an iron deficiency without current anemia.  Natalie Edwards was scheduled for IV iron in May of this year, however due to scheduling conflicts this was not performed and Natalie Edwards received no iron supplementation.  On review today Natalie Edwards still has some symptoms of iron deficiency including fatigue and ice craving.  On review her iron studies show low serum iron with a high normal TIBC a 9% sat and ferritin 42.  Given these findings I will recommend p.o. iron supplementation with iron sulfate and reevaluation in 3 months time to determine if her iron studies improve, her platelet count decreases, and her ice craving subsist.  Additionally she has a longstanding leukocytosis which dates back to at least 2018 prior records.  On discussion with Natalie Edwards she is currently an active smoker and that could potentially explain Natalie findings we are seeing here.  If with appropriate iron supplementation she  continues to  have a thrombocytosis I think it would be reasonable to further upper MPN work-up with testing of BCR ABL.  She previously had MPN testing with Dr. Gweneth Dimitri which showed none of Natalie classical mutations including JAK2, MPL, and CAL R.   Relevant studies:  09/12/2016: EGD with non-bleeding ulcer.   #Iron Deficiency without Anemia #Thrombocytosis, stable --today will reorder CBC, CMP, iron panel and ferritin  --on review of prior labs Natalie Edwards was iron deficient but never received supplementation --will order PO iron sulfate 325mg  daily with 500mg  vitamin C tablets (Edwards cannot tolerate OJ due to peptic ulcers) --source of bleeding is thought to be GI from gastric ulcer bleeding. Edwards does not have any further GYN bleeding since hysterectomy in 2007.  --if Edwards has persistent iron deficiency will plan to treat with IV iron therapy.  --labs today show WBC 16.0, Hgb 12.0, MCV 84.6, Plt 451 --RTC pending Natalie results of her studies.  #Leukocytosis, stable --present and stable on labs for Natalie last 2 years --likely 2/2 to smoking, though in combination with thrombocytosis may indicate an MPN such as CML (tested negative for JAK2, MPL, and CALR prior). Will order BCR/ABL FISH today.  --ESR elevated at last visit.  --continue to monitor.   All questions were answered. Natalie Edwards knows to call Natalie clinic with any problems, questions or concerns.  A total of more than 60 minutes were spent on this encounter and over half of that time was spent on counseling and coordination of care as outlined above.   Ulysees Barns, MD Department of Hematology/Oncology [facility] Phone: [phone] Pager: [phone] Email: [email or web address]  10/12/2022 5:40 PM

## 2022-10-12 ENCOUNTER — Encounter: Payer: Self-pay | Admitting: Internal Medicine

## 2022-10-13 ENCOUNTER — Other Ambulatory Visit: Payer: Self-pay | Admitting: Family Medicine

## 2022-10-13 ENCOUNTER — Encounter: Payer: Self-pay | Admitting: Family Medicine

## 2022-10-13 DIAGNOSIS — I10 Essential (primary) hypertension: Secondary | ICD-10-CM

## 2022-10-17 ENCOUNTER — Other Ambulatory Visit: Payer: Self-pay

## 2022-10-17 ENCOUNTER — Encounter (HOSPITAL_COMMUNITY): Payer: Self-pay

## 2022-10-17 ENCOUNTER — Emergency Department (HOSPITAL_COMMUNITY): Payer: 59

## 2022-10-17 ENCOUNTER — Ambulatory Visit
Admission: EM | Admit: 2022-10-17 | Discharge: 2022-10-17 | Disposition: A | Discharge status: Home / Self Care | Payer: 59

## 2022-10-17 ENCOUNTER — Observation Stay (HOSPITAL_COMMUNITY)
Admission: EM | Admit: 2022-10-17 | Discharge: 2022-10-18 | Disposition: A | Discharge status: Home / Self Care | Payer: 59 | Attending: Family Medicine | Admitting: Family Medicine

## 2022-10-17 DIAGNOSIS — Z79899 Other long term (current) drug therapy: Secondary | ICD-10-CM | POA: Diagnosis not present

## 2022-10-17 DIAGNOSIS — F17210 Nicotine dependence, cigarettes, uncomplicated: Secondary | ICD-10-CM | POA: Diagnosis not present

## 2022-10-17 DIAGNOSIS — K5792 Diverticulitis of intestine, part unspecified, without perforation or abscess without bleeding: Principal | ICD-10-CM | POA: Diagnosis present

## 2022-10-17 DIAGNOSIS — N281 Cyst of kidney, acquired: Secondary | ICD-10-CM | POA: Diagnosis not present

## 2022-10-17 DIAGNOSIS — R1013 Epigastric pain: Secondary | ICD-10-CM

## 2022-10-17 DIAGNOSIS — R1084 Generalized abdominal pain: Secondary | ICD-10-CM | POA: Diagnosis not present

## 2022-10-17 DIAGNOSIS — I10 Essential (primary) hypertension: Secondary | ICD-10-CM | POA: Diagnosis not present

## 2022-10-17 DIAGNOSIS — R109 Unspecified abdominal pain: Secondary | ICD-10-CM | POA: Diagnosis not present

## 2022-10-17 DIAGNOSIS — K5732 Diverticulitis of large intestine without perforation or abscess without bleeding: Principal | ICD-10-CM | POA: Insufficient documentation

## 2022-10-17 DIAGNOSIS — R1032 Left lower quadrant pain: Secondary | ICD-10-CM | POA: Diagnosis present

## 2022-10-17 LAB — CBC WITH DIFFERENTIAL/PLATELET
Abs Immature Granulocytes: 0.08 10*3/uL — ABNORMAL HIGH (ref 0.00–0.07)
Basophils Absolute: 0 10*3/uL (ref 0.0–0.1)
Basophils Relative: 0 %
Eosinophils Absolute: 0.1 10*3/uL (ref 0.0–0.5)
Eosinophils Relative: 1 %
HCT: 35.2 % — ABNORMAL LOW (ref 36.0–46.0)
Hemoglobin: 11.1 g/dL — ABNORMAL LOW (ref 12.0–15.0)
Immature Granulocytes: 1 %
Lymphocytes Relative: 37 %
Lymphs Abs: 5.1 10*3/uL — ABNORMAL HIGH (ref 0.7–4.0)
MCH: 26.9 pg (ref 26.0–34.0)
MCHC: 31.5 g/dL (ref 30.0–36.0)
MCV: 85.4 fL (ref 80.0–100.0)
Monocytes Absolute: 0.9 10*3/uL (ref 0.1–1.0)
Monocytes Relative: 6 %
Neutro Abs: 7.5 10*3/uL (ref 1.7–7.7)
Neutrophils Relative %: 55 %
Platelets: 621 10*3/uL — ABNORMAL HIGH (ref 150–400)
RBC: 4.12 MIL/uL (ref 3.87–5.11)
RDW: 18.2 % — ABNORMAL HIGH (ref 11.5–15.5)
WBC: 13.7 10*3/uL — ABNORMAL HIGH (ref 4.0–10.5)
nRBC: 0 % (ref 0.0–0.2)

## 2022-10-17 LAB — URINALYSIS, ROUTINE W REFLEX MICROSCOPIC
Bilirubin Urine: NEGATIVE
Glucose, UA: NEGATIVE mg/dL
Hgb urine dipstick: NEGATIVE
Ketones, ur: NEGATIVE mg/dL
Leukocytes,Ua: NEGATIVE
Nitrite: NEGATIVE
Protein, ur: NEGATIVE mg/dL
Specific Gravity, Urine: 1.013 (ref 1.005–1.030)
pH: 5 (ref 5.0–8.0)

## 2022-10-17 LAB — COMPREHENSIVE METABOLIC PANEL
ALT: 25 U/L (ref 0–44)
AST: 22 U/L (ref 15–41)
Albumin: 3.7 g/dL (ref 3.5–5.0)
Alkaline Phosphatase: 59 U/L (ref 38–126)
Anion gap: 9 (ref 5–15)
BUN: 15 mg/dL (ref 6–20)
CO2: 21 mmol/L — ABNORMAL LOW (ref 22–32)
Calcium: 9.1 mg/dL (ref 8.9–10.3)
Chloride: 106 mmol/L (ref 98–111)
Creatinine, Ser: 1.05 mg/dL — ABNORMAL HIGH (ref 0.44–1.00)
GFR, Estimated: >60 mL/min (ref >60)
Glucose, Bld: 84 mg/dL (ref 70–99)
Potassium: 4.1 mmol/L (ref 3.5–5.1)
Sodium: 136 mmol/L (ref 135–145)
Total Bilirubin: 0.5 mg/dL (ref 0.3–1.2)
Total Protein: 7.7 g/dL (ref 6.5–8.1)

## 2022-10-17 LAB — LIPASE, BLOOD: Lipase: 25 U/L (ref 11–51)

## 2022-10-17 MED ORDER — HYDROMORPHONE HCL 1 MG/ML IJ SOLN
1.0000 mg | Freq: Once | INTRAMUSCULAR | Status: AC
  Administered 2022-10-17: 1 mg via INTRAVENOUS
  Filled 2022-10-17: qty 1

## 2022-10-17 MED ORDER — SODIUM CHLORIDE 0.9 % IV BOLUS
1000.0000 mL | Freq: Once | INTRAVENOUS | Status: AC
  Administered 2022-10-17: 1000 mL via INTRAVENOUS

## 2022-10-17 MED ORDER — HYDROMORPHONE HCL 1 MG/ML IJ SOLN
0.5000 mg | Freq: Once | INTRAMUSCULAR | Status: AC
  Administered 2022-10-17: 0.5 mg via INTRAVENOUS
  Filled 2022-10-17: qty 1

## 2022-10-17 MED ORDER — METRONIDAZOLE 500 MG/100ML IV SOLN
500.0000 mg | Freq: Once | INTRAVENOUS | Status: AC
  Administered 2022-10-18: 500 mg via INTRAVENOUS
  Filled 2022-10-17: qty 100

## 2022-10-17 MED ORDER — IOHEXOL 300 MG/ML  SOLN
100.0000 mL | Freq: Once | INTRAMUSCULAR | Status: AC | PRN
  Administered 2022-10-17: 100 mL via INTRAVENOUS

## 2022-10-17 MED ORDER — PANTOPRAZOLE SODIUM 40 MG IV SOLR
40.0000 mg | Freq: Once | INTRAVENOUS | Status: AC
  Administered 2022-10-17: 40 mg via INTRAVENOUS
  Filled 2022-10-17: qty 10

## 2022-10-17 MED ORDER — SODIUM CHLORIDE 0.9 % IV SOLN
2.0000 g | Freq: Once | INTRAVENOUS | Status: AC
  Administered 2022-10-17: 2 g via INTRAVENOUS
  Filled 2022-10-17: qty 20

## 2022-10-17 NOTE — ED Provider Notes (Signed)
[place] EMERGENCY DEPARTMENT AT [facility] Provider Note   CSN: [id number] Arrival date & time: 10/17/22  1811     History {Add pertinent medical, surgical, social history, OB history to HPI:1} Chief Complaint  Patient presents with   Abdominal Pain    Natalie Edwards is a 57 y.o. female.  Patient has a history of GERD.  Pt complains of abdominal pain with nausea for 2 days  The history is provided by the patient and medical records. No language interpreter was used.  Abdominal Pain Pain location:  LLQ and LUQ Pain quality: aching   Pain radiates to:  Does not radiate Pain severity:  Moderate Onset quality:  Sudden Timing:  Constant Progression:  Worsening Chronicity:  New Context: not alcohol use   Relieved by:  Nothing      Home Medications Prior to Admission medications   Medication Sig Start Date End Date Taking? Authorizing Provider  acetaminophen (TYLENOL) 325 MG tablet Take 2 tablets (650 mg total) by mouth every 6 (six) hours as needed for mild pain (or Fever >/= 101). 09/30/22   Sheikh, Omair Latif, DO  amLODipine (NORVASC) 10 MG tablet Take 1 tablet (10 mg total) by mouth daily. 10/01/22   Sheikh, Omair Latif, DO  amLODipine-benazepril (LOTREL) 10-40 MG capsule TAKE 1 CAPSULE BY MOUTH DAILY 10/16/22 04/14/23  [name], MD  BELSOMRA 20 MG TABS Take 1 tablet (20 mg total) by mouth at bedtime. 09/01/22   Olalere, Onnie Boer A, MD  FLUoxetine (PROZAC) 20 MG capsule Take 20 mg by mouth daily. 10/02/22   [provider]  hydrochlorothiazide (HYDRODIURIL) 25 MG tablet TAKE 1 TABLET BY MOUTH DAILY 10/16/22   [name], MD  hydrOXYzine (ATARAX) 25 MG tablet Take 25 mg by mouth 2 (two) times daily as needed. 10/02/22   [provider]  loratadine (CLARITIN) 10 MG tablet Take 1 tablet (10 mg total) by mouth daily as needed for allergies. 09/24/22   Bing Neighbors, NP  metoCLOPramide (REGLAN) 10 MG tablet Take 1 tablet (10 mg total) by mouth  every 6 (six) hours. 09/24/22   Bing Neighbors, NP  mirtazapine (REMERON) 45 MG tablet Take 1 tablet (45 mg total) by mouth at bedtime. 09/01/22   [name], MD  pantoprazole (PROTONIX) 40 MG tablet Take 1 tablet (40 mg total) by mouth 2 (two) times daily. 09/30/22 10/30/22  Marguerita Merles Latif, DO  polyethylene glycol (MIRALAX / GLYCOLAX) 17 g packet Take 17 g by mouth daily as needed for mild constipation. 09/30/22   Marguerita Merles Latif, DO  pregabalin (LYRICA) 75 MG capsule Take 75 mg by mouth 2 (two) times daily. 06/23/22   [provider]  temazepam (RESTORIL) 30 MG capsule Take 1 capsule (30 mg total) by mouth at bedtime as needed. 09/01/22   Tomma Lightning, MD      Allergies    Hydroxyzine and Ambien [zolpidem tartrate]    Review of Systems   Review of Systems  Gastrointestinal:  Positive for abdominal pain.    Physical Exam Updated Vital Signs BP 125/77   Pulse 73   Temp 98.7 F (37.1 C) (Oral)   Resp 18   Ht 5\' 3"  (1.6 m)   Wt 82 kg   SpO2 94%   BMI 32.02 kg/m  Physical Exam  ED Results / Procedures / Treatments   Labs (all labs ordered are listed, but only abnormal results are displayed) Labs Reviewed  CBC WITH DIFFERENTIAL/PLATELET - Abnormal; Notable for  the following components:      Result Value   WBC 13.7 (*)    Hemoglobin 11.1 (*)    HCT 35.2 (*)    RDW 18.2 (*)    Platelets 621 (*)    Lymphs Abs 5.1 (*)    Abs Immature Granulocytes 0.08 (*)    All other components within normal limits  COMPREHENSIVE METABOLIC PANEL - Abnormal; Notable for the following components:   CO2 21 (*)    Creatinine, Ser 1.05 (*)    All other components within normal limits  LIPASE, BLOOD  URINALYSIS, ROUTINE W REFLEX MICROSCOPIC    EKG None  Radiology CT ABDOMEN PELVIS W CONTRAST  Result Date: 10/17/2022 CLINICAL DATA:  Mid abdominal pain, nausea since yesterday EXAM: CT ABDOMEN AND PELVIS WITH CONTRAST TECHNIQUE: Multidetector CT imaging of the abdomen  and pelvis was performed using the standard protocol following bolus administration of intravenous contrast. RADIATION DOSE REDUCTION: This exam was performed according to the departmental dose-optimization program which includes automated exposure control, adjustment of the mA and/or kV according to patient size and/or use of iterative reconstruction technique. CONTRAST:  OMNIPAQUE IOHEXOL 300 MG/ML  SOLN COMPARISON:  09/28/2022 FINDINGS: Lower chest: No acute pleural or parenchymal lung disease. Hepatobiliary: No focal liver abnormality is seen. Status post cholecystectomy. No biliary dilatation. Pancreas: Unremarkable. No pancreatic ductal dilatation or surrounding inflammatory changes. Spleen: Normal in size without focal abnormality. Adrenals/Urinary Tract: Stable simple bilateral renal cortical and peripelvic cysts which do not require specific imaging follow-up. Otherwise the kidneys enhance normally. No urinary tract calculi or obstructive uropathy within either kidney. Stable 1.4 cm left adrenal nodule, previously characterized as adenoma. Right adrenal and bladder are unremarkable. Stomach/Bowel: No bowel obstruction or ileus. Normal appendix within the central upper pelvis. Diverticulosis of the descending and sigmoid colon, with interval development of segmental wall thickening and pericolonic fat stranding involving the distal descending and proximal sigmoid colon, consistent with acute uncomplicated diverticulitis. No perforation, fluid collection, or abscess. Vascular/Lymphatic: Aortic atherosclerosis. No enlarged abdominal or pelvic lymph nodes. Reproductive: Status post hysterectomy. No adnexal masses. Other: Trace pelvic free fluid, nonspecific. No free intraperitoneal gas. No abdominal wall hernia. Musculoskeletal: No acute or destructive bony abnormalities. Reconstructed images demonstrate no additional findings. IMPRESSION: 1. Acute uncomplicated diverticulitis of the distal descending and  proximal sigmoid colon. No perforation, fluid collection, or abscess. 2.  Aortic Atherosclerosis (ICD10-I70.0). Electronically Signed   By: Sharlet Salina M.D.   On: 10/17/2022 20:36    Procedures Procedures  {Document cardiac monitor, telemetry assessment procedure when appropriate:1}  Medications Ordered in ED Medications  cefTRIAXone (ROCEPHIN) 2 g in sodium chloride 0.9 % 100 mL IVPB (has no administration in time range)    And  metroNIDAZOLE (FLAGYL) IVPB 500 mg (has no administration in time range)  HYDROmorphone (DILAUDID) injection 0.5 mg (has no administration in time range)  pantoprazole (PROTONIX) injection 40 mg (40 mg Intravenous Given 10/17/22 1844)  HYDROmorphone (DILAUDID) injection 1 mg (1 mg Intravenous Given 10/17/22 1843)  sodium chloride 0.9 % bolus 1,000 mL (1,000 mLs Intravenous New Bag/Given 10/17/22 1844)  iohexol (OMNIPAQUE) 300 MG/ML solution 100 mL (100 mLs Intravenous Contrast Given 10/17/22 2010)    ED Course/ Medical Decision Making/ A&P   {   Click here for ABCD2, HEART and other calculatorsREFRESH Note before signing :1}  Medical Decision Making Amount and/or Complexity of Data Reviewed Labs: ordered. Radiology: ordered.  Risk Prescription drug management. Decision regarding hospitalization.   Patient has diverticulitis she will be admitted to medicine  {Document critical care time when appropriate:1} {Document review of labs and clinical decision tools ie heart score, Chads2Vasc2 etc:1}  {Document your independent review of radiology images, and any outside records:1} {Document your discussion with family members, caretakers, and with consultants:1} {Document social determinants of health affecting pt's care:1} {Document your decision making why or why not admission, treatments were needed:1} Final Clinical Impression(s) / ED Diagnoses Final diagnoses:  Diverticulitis    Rx / DC Orders ED Discharge Orders     None

## 2022-10-17 NOTE — ED Notes (Signed)
Patient is being discharged from the Urgent Care and sent to the Emergency Department via private vehicle . Per Nickie Retort PA, patient is in need of higher level of care due to abdominal pain. Patient is aware and verbalizes understanding of plan of care.  Vitals:   10/17/22 1701  BP: 120/79  Pulse: 87  Resp: 16  Temp: 98 F (36.7 C)  SpO2: 94%

## 2022-10-17 NOTE — ED Triage Notes (Addendum)
Patient BIB [facility] from urgent care. Has had left lower abdominal pain for 2 days. Hospitalized a month ago for gastric ulcers and patient said it feels the same way. Feels nauseous.

## 2022-10-17 NOTE — ED Notes (Signed)
911 called  per R. Lenise Arena PA for transport to the ED.

## 2022-10-17 NOTE — H&P (Signed)
History and Physical   [facility] [place] [facility] Admission History and Physical [employer], D.O.    Patient Name: Natalie Edwards MR#: [id number] Date of Birth: [birth year] Date of Admission: 10/17/2022  Referring MD/NP/PA: Dr. Estell Harpin Primary Care Physician: [name], MD  Chief Complaint:  Chief Complaint  Patient presents with   Abdominal Pain    HPI: Natalie Edwards is a 57 y.o. female with a known history of allergy, anemia, gastric ulcers, GERD, HTN presents to the emergency department for evaluation of abdominal pain.  Patient was in a usual state of health until last month she was admitted with a bleeding gastric ulcer she presents to the hospital today with 1 day history of 10/10 epigastric and left upper quadrant abdominal pain that is similar to symptoms from her prior hospitalization.[name]  He also reports greenish diarrhea  Patient denies fevers/chills, weakness, dizziness, chest pain, shortness of breath, N/V/C/D, dysuria/frequency, changes in mental status.    Otherwise there has been no change in status. Patient has been taking medication as prescribed and there has been no recent change in medication or diet.  No recent antibiotics.  There has been no recent illness, hospitalizations, travel or sick contacts.    EMS/ED Course: Patient received Rocephin, Flagyl, Protonix, Dilaudid, normal saline. Medical admission has been requested for further management of acute diverticulitis.  Review of Systems:  CONSTITUTIONAL: No fever/chills, fatigue, weakness, weight gain/loss, headache. EYES: No blurry or double vision. ENT: No tinnitus, postnasal drip, redness or soreness of the oropharynx. RESPIRATORY: No cough, dyspnea, wheeze.  No hemoptysis.  CARDIOVASCULAR: No chest pain, palpitations, syncope, orthopnea. No lower extremity edema.  GASTROINTESTINAL: Nausea and abdominal pain.  Change in stool color no nausea, vomiting, diarrhea, constipation.  No hematemesis,  melena or hematochezia. GENITOURINARY: No dysuria, frequency, hematuria. ENDOCRINE: No polyuria or nocturia. No heat or cold intolerance. HEMATOLOGY: No anemia, bruising, bleeding. INTEGUMENTARY: No rashes, ulcers, lesions. MUSCULOSKELETAL: No arthritis, gout. NEUROLOGIC: No numbness, tingling, ataxia, seizure-type activity, weakness. PSYCHIATRIC: No anxiety, depression, insomnia.   Past Medical History:  Diagnosis Date   Allergy    Anemia    Diverticulosis    Gall bladder stones 1993   Gastric ulcer    GERD (gastroesophageal reflux disease)    Hypertension    Renal mass     Past Surgical History:  Procedure Laterality Date   ABDOMINAL HYSTERECTOMY     BIOPSY  09/29/2022   Procedure: BIOPSY;  Surgeon: Napoleon Form, MD;  Location: [facility];  Service: Gastroenterology;;   CHOLECYSTECTOMY     ESOPHAGOGASTRODUODENOSCOPY (EGD) WITH PROPOFOL N/A 09/29/2022   Procedure: ESOPHAGOGASTRODUODENOSCOPY (EGD) WITH PROPOFOL;  Surgeon: Napoleon Form, MD;  Location: [facility];  Service: Gastroenterology;  Laterality: N/A;     reports that she has been smoking cigarettes. She has a 12.5 pack-year smoking history. She has never used smokeless tobacco. She reports current alcohol use. She reports that she does not use drugs.  Allergies  Allergen Reactions   Hydroxyzine Other (See Comments)    Palpitations and jitteriness    Ambien [Zolpidem Tartrate] Other (See Comments)    Jitteriness, nervousness, abdominal pain    Family History  Problem Relation Age of Onset   Cancer Maternal Uncle        Lung   Cancer Maternal Uncle        Lung   Cancer Maternal Uncle        Lung   Headache Neg Hx        "  I don't think so"   Migraines Neg Hx        "I don't think so"    Prior to Admission medications   Medication Sig Start Date End Date Taking? Authorizing Provider  acetaminophen (TYLENOL) 325 MG tablet Take 2 tablets (650 mg total) by mouth every 6 (six) hours as needed  for mild pain (or Fever >/= 101). 09/30/22   Sheikh, Omair Latif, DO  amLODipine (NORVASC) 10 MG tablet Take 1 tablet (10 mg total) by mouth daily. 10/01/22   Sheikh, Omair Latif, DO  amLODipine-benazepril (LOTREL) 10-40 MG capsule TAKE 1 CAPSULE BY MOUTH DAILY 10/16/22 04/14/23  [name], MD  BELSOMRA 20 MG TABS Take 1 tablet (20 mg total) by mouth at bedtime. 09/01/22   Olalere, Onnie Boer A, MD  FLUoxetine (PROZAC) 20 MG capsule Take 20 mg by mouth daily. 10/02/22   [provider]  hydrochlorothiazide (HYDRODIURIL) 25 MG tablet TAKE 1 TABLET BY MOUTH DAILY 10/16/22   [name], MD  hydrOXYzine (ATARAX) 25 MG tablet Take 25 mg by mouth 2 (two) times daily as needed. 10/02/22   [provider]  loratadine (CLARITIN) 10 MG tablet Take 1 tablet (10 mg total) by mouth daily as needed for allergies. 09/24/22   Bing Neighbors, NP  metoCLOPramide (REGLAN) 10 MG tablet Take 1 tablet (10 mg total) by mouth every 6 (six) hours. 09/24/22   Bing Neighbors, NP  mirtazapine (REMERON) 45 MG tablet Take 1 tablet (45 mg total) by mouth at bedtime. 09/01/22   [name], MD  pantoprazole (PROTONIX) 40 MG tablet Take 1 tablet (40 mg total) by mouth 2 (two) times daily. 09/30/22 10/30/22  Marguerita Merles Latif, DO  polyethylene glycol (MIRALAX / GLYCOLAX) 17 g packet Take 17 g by mouth daily as needed for mild constipation. 09/30/22   Marguerita Merles Latif, DO  pregabalin (LYRICA) 75 MG capsule Take 75 mg by mouth 2 (two) times daily. 06/23/22   [provider]  temazepam (RESTORIL) 30 MG capsule Take 1 capsule (30 mg total) by mouth at bedtime as needed. 09/01/22   Tomma Lightning, MD    Physical Exam: Vitals:   10/17/22 1815 10/17/22 1817 10/17/22 2115 10/17/22 2229  BP:  121/80 125/77   Pulse:  79 73   Resp:  18 18   Temp:  98.9 F (37.2 C)  98.7 F (37.1 C)  TempSrc:  Oral  Oral  SpO2:  96% 94%   Weight: 82 kg     Height: 5\' 3"  (1.6 m)       GENERAL: 57 y.o.-year-old  black female patient, well-developed, well-nourished lying in the bed in no acute distress.  Pleasant and cooperative.   HEENT: Head atraumatic, normocephalic. Pupils equal. Mucus membranes moist. NECK: Supple. No JVD. CHEST: Normal breath sounds bilaterally. No wheezing, rales, rhonchi or crackles. No use of accessory muscles of respiration.  No reproducible chest wall tenderness.  CARDIOVASCULAR: S1, S2 normal. No murmurs, rubs, or gallops. Cap refill <2 seconds. Pulses intact distally.  ABDOMEN: Soft, nondistended, generalized tenderness diffusely worse at left upper quadrant no rebound, guarding, rigidity. Normoactive bowel sounds present in all four quadrants.  EXTREMITIES: No pedal edema, cyanosis, or clubbing. No calf tenderness or Homan's sign.  NEUROLOGIC: The patient is alert and oriented x 3. Cranial nerves II through XII are grossly intact with no focal sensorimotor deficit. PSYCHIATRIC:  Normal affect, mood, thought content. SKIN: Warm, dry, and intact without obvious rash, lesion, or ulcer.  Labs on Admission:  CBC: Recent Labs  Lab 10/17/22 1844  WBC 13.7*  NEUTROABS 7.5  HGB 11.1*  HCT 35.2*  MCV 85.4  PLT 621*   Basic Metabolic Panel: Recent Labs  Lab 10/17/22 1844  NA 136  K 4.1  CL 106  CO2 21*  GLUCOSE 84  BUN 15  CREATININE 1.05*  CALCIUM 9.1   GFR: Estimated Creatinine Clearance: 59.9 mL/min (A) (by C-G formula based on SCr of 1.05 mg/dL (H)). Liver Function Tests: Recent Labs  Lab 10/17/22 1844  AST 22  ALT 25  ALKPHOS 59  BILITOT 0.5  PROT 7.7  ALBUMIN 3.7   Recent Labs  Lab 10/17/22 1844  LIPASE 25   No results for input(s): "AMMONIA" in the last 168 hours. Coagulation Profile: No results for input(s): "INR", "PROTIME" in the last 168 hours. Cardiac Enzymes: No results for input(s): "CKTOTAL", "CKMB", "CKMBINDEX", "TROPONINI" in the last 168 hours. BNP (last 3 results) No results for input(s): "PROBNP" in the last 8760  hours. HbA1C: No results for input(s): "HGBA1C" in the last 72 hours. CBG: No results for input(s): "GLUCAP" in the last 168 hours. Lipid Profile: No results for input(s): "CHOL", "HDL", "LDLCALC", "TRIG", "CHOLHDL", "LDLDIRECT" in the last 72 hours. Thyroid Function Tests: No results for input(s): "TSH", "T4TOTAL", "FREET4", "T3FREE", "THYROIDAB" in the last 72 hours. Anemia Panel: No results for input(s): "VITAMINB12", "FOLATE", "FERRITIN", "TIBC", "IRON", "RETICCTPCT" in the last 72 hours. Urine analysis:    Component Value Date/Time   COLORURINE YELLOW 10/17/2022 1920   APPEARANCEUR CLEAR 10/17/2022 1920   LABSPEC 1.013 10/17/2022 1920   PHURINE 5.0 10/17/2022 1920   GLUCOSEU NEGATIVE 10/17/2022 1920   HGBUR NEGATIVE 10/17/2022 1920   BILIRUBINUR NEGATIVE 10/17/2022 1920   BILIRUBINUR negative 06/30/2018 1005   KETONESUR NEGATIVE 10/17/2022 1920   PROTEINUR NEGATIVE 10/17/2022 1920   UROBILINOGEN 0.2 06/30/2018 1005   NITRITE NEGATIVE 10/17/2022 1920   LEUKOCYTESUR NEGATIVE 10/17/2022 1920   Sepsis Labs: @LABRCNTIP (procalcitonin:4,lacticidven:4) )No results found for this or any previous visit (from the past 240 hour(s)).   Radiological Exams on Admission: CT ABDOMEN PELVIS W CONTRAST  Result Date: 10/17/2022 CLINICAL DATA:  Mid abdominal pain, nausea since yesterday EXAM: CT ABDOMEN AND PELVIS WITH CONTRAST TECHNIQUE: Multidetector CT imaging of the abdomen and pelvis was performed using the standard protocol following bolus administration of intravenous contrast. RADIATION DOSE REDUCTION: This exam was performed according to the departmental dose-optimization program which includes automated exposure control, adjustment of the mA and/or kV according to patient size and/or use of iterative reconstruction technique. CONTRAST:  OMNIPAQUE IOHEXOL 300 MG/ML  SOLN COMPARISON:  09/28/2022 FINDINGS: Lower chest: No acute pleural or parenchymal lung disease. Hepatobiliary: No  focal liver abnormality is seen. Status post cholecystectomy. No biliary dilatation. Pancreas: Unremarkable. No pancreatic ductal dilatation or surrounding inflammatory changes. Spleen: Normal in size without focal abnormality. Adrenals/Urinary Tract: Stable simple bilateral renal cortical and peripelvic cysts which do not require specific imaging follow-up. Otherwise the kidneys enhance normally. No urinary tract calculi or obstructive uropathy within either kidney. Stable 1.4 cm left adrenal nodule, previously characterized as adenoma. Right adrenal and bladder are unremarkable. Stomach/Bowel: No bowel obstruction or ileus. Normal appendix within the central upper pelvis. Diverticulosis of the descending and sigmoid colon, with interval development of segmental wall thickening and pericolonic fat stranding involving the distal descending and proximal sigmoid colon, consistent with acute uncomplicated diverticulitis. No perforation, fluid collection, or abscess. Vascular/Lymphatic: Aortic atherosclerosis. No enlarged abdominal or pelvic lymph  nodes. Reproductive: Status post hysterectomy. No adnexal masses. Other: Trace pelvic free fluid, nonspecific. No free intraperitoneal gas. No abdominal wall hernia. Musculoskeletal: No acute or destructive bony abnormalities. Reconstructed images demonstrate no additional findings. IMPRESSION: 1. Acute uncomplicated diverticulitis of the distal descending and proximal sigmoid colon. No perforation, fluid collection, or abscess. 2.  Aortic Atherosclerosis (ICD10-I70.0). Electronically Signed   By: Sharlet Salina M.D.   On: 10/17/2022 20:36     Assessment/Plan  This is a 57 y.o. female with a history of allergy, anemia, gastric ulcers, GERD, HTN  now being admitted with:  #.  Acute diverticulitis - Admit to observation - Continue Rocephin and Flagyl - Follow-up stool cultures - Pain control - IV fluids  #.  History of hypertension - Continue Norvasc, Lotrel,  HydroDIURIL  #.  History of GERD - Continue Protonix  #. History of depression - Continue Prozac  Admission status: Observation IV Fluids: Normal saline Diet/Nutrition: Clear liquids Consults called: None DVT Px: Lovenox, SCDs and early ambulation. Code Status: Full Code  Disposition Plan: To home in 1-2 days  All the records are reviewed and case discussed with ED provider. Management plans discussed with the patient and/or family who express understanding and agree with plan of care.    D.O. on 10/17/2022 at 10:43 PM CC: Primary care physician; [name], MD   10/17/2022, 10:43 PM

## 2022-10-17 NOTE — ED Triage Notes (Signed)
Pt states mid abdominal pain, headache and nausea since yesterday.  States she was hospitalized last month for the same pain.

## 2022-10-17 NOTE — ED Notes (Signed)
Patient is in CT at this time

## 2022-10-17 NOTE — ED Provider Notes (Signed)
[facility]    CSN: [id number] Arrival date & time: 10/17/22  1652      History   Chief Complaint Chief Complaint  Patient presents with   Abdominal Pain    HPI Natalie Edwards is a 57 y.o. female.   Patient here today for evaluation of epigastric and left upper quadrant pain that started yesterday.  She states she has had headache and nausea as well.  She was hospitalized last month with similar symptoms and was diagnosed with a bleeding gastric ulcer.  She reports her stools have changed recently to more green color.  She has not had fever.  She rates her pain as a 10 out of 10.  The history is provided by the patient.  Abdominal Pain Associated symptoms: nausea   Associated symptoms: no chills, no diarrhea, no fever, no shortness of breath and no vomiting     Past Medical History:  Diagnosis Date   Allergy    Anemia    Diverticulosis    Gall bladder stones 1993   Gastric ulcer    GERD (gastroesophageal reflux disease)    Hypertension    Renal mass     Patient Active Problem List   Diagnosis Date Noted   Melena 09/29/2022   Gastric ulcer without hemorrhage or perforation 09/29/2022   AKI (acute kidney injury) (HCC) 09/28/2022   Metabolic acidosis 09/28/2022   Epigastric pain 09/28/2022   Nausea 09/28/2022   Hyperlipidemia 12/14/2021   Prediabetes 12/14/2021   Anxiety and depression 05/14/2021   Breathing difficult 05/14/2021   Upper respiratory tract infection 05/14/2021   GERD (gastroesophageal reflux disease)    Visit for routine gyn exam 01/14/2021   Screening mammogram, encounter for 01/14/2021   Possible exposure to STD 01/14/2021   Chronic insomnia 10/18/2019   CKD (chronic kidney disease) 04/07/2019   GAD (generalized anxiety disorder) 05/07/2018   Chronic gastric ulcer 05/07/2018   Essential hypertension 05/07/2018   Iron deficiency anemia due to chronic blood loss 09/23/2016   Peptic ulcer disease with hemorrhage 09/23/2016    Thrombocytosis 09/09/2016   Leukocytosis 09/09/2016   Gastric nodule 09/09/2016   H/O ulcer disease 07/31/2016    Past Surgical History:  Procedure Laterality Date   ABDOMINAL HYSTERECTOMY     BIOPSY  09/29/2022   Procedure: BIOPSY;  Surgeon: Napoleon Form, MD;  Location: [facility];  Service: Gastroenterology;;   CHOLECYSTECTOMY     ESOPHAGOGASTRODUODENOSCOPY (EGD) WITH PROPOFOL N/A 09/29/2022   Procedure: ESOPHAGOGASTRODUODENOSCOPY (EGD) WITH PROPOFOL;  Surgeon: Napoleon Form, MD;  Location: [facility];  Service: Gastroenterology;  Laterality: N/A;    OB History     Gravida  7   Para  3   Term  3   Preterm      AB  4   Living         SAB      IAB  4   Ectopic      Multiple      Live Births               Home Medications    Prior to Admission medications   Medication Sig Start Date End Date Taking? Authorizing Provider  acetaminophen (TYLENOL) 325 MG tablet Take 2 tablets (650 mg total) by mouth every 6 (six) hours as needed for mild pain (or Fever >/= 101). 09/30/22   Sheikh, Omair Latif, DO  amLODipine (NORVASC) 10 MG tablet Take 1 tablet (10 mg total) by mouth daily. 10/01/22  Sheikh, Omair Latif, DO  amLODipine-benazepril (LOTREL) 10-40 MG capsule TAKE 1 CAPSULE BY MOUTH DAILY 10/16/22 04/14/23  [name], MD  BELSOMRA 20 MG TABS Take 1 tablet (20 mg total) by mouth at bedtime. 09/01/22   Olalere, Onnie Boer A, MD  FLUoxetine (PROZAC) 20 MG capsule Take 20 mg by mouth daily. 10/02/22   [provider]  hydrochlorothiazide (HYDRODIURIL) 25 MG tablet TAKE 1 TABLET BY MOUTH DAILY 10/16/22   [name], MD  hydrOXYzine (ATARAX) 25 MG tablet Take 25 mg by mouth 2 (two) times daily as needed. 10/02/22   [provider]  loratadine (CLARITIN) 10 MG tablet Take 1 tablet (10 mg total) by mouth daily as needed for allergies. 09/24/22   Bing Neighbors, NP  metoCLOPramide (REGLAN) 10 MG tablet Take 1 tablet (10 mg total) by mouth  every 6 (six) hours. 09/24/22   Bing Neighbors, NP  mirtazapine (REMERON) 45 MG tablet Take 1 tablet (45 mg total) by mouth at bedtime. 09/01/22   [name], MD  pantoprazole (PROTONIX) 40 MG tablet Take 1 tablet (40 mg total) by mouth 2 (two) times daily. 09/30/22 10/30/22  Marguerita Merles Latif, DO  polyethylene glycol (MIRALAX / GLYCOLAX) 17 g packet Take 17 g by mouth daily as needed for mild constipation. 09/30/22   Marguerita Merles Latif, DO  pregabalin (LYRICA) 75 MG capsule Take 75 mg by mouth 2 (two) times daily. 06/23/22   [provider]  temazepam (RESTORIL) 30 MG capsule Take 1 capsule (30 mg total) by mouth at bedtime as needed. 09/01/22   Tomma Lightning, MD    Family History Family History  Problem Relation Age of Onset   Cancer Maternal Uncle        Lung   Cancer Maternal Uncle        Lung   Cancer Maternal Uncle        Lung   Headache Neg Hx        "I don't think so"   Migraines Neg Hx        "I don't think so"    Social History Social History   Tobacco Use   Smoking status: Every Day    Current packs/day: 0.50    Average packs/day: 0.5 packs/day for 25.0 years (12.5 ttl pk-yrs)    Types: Cigarettes   Smokeless tobacco: Never   Tobacco comments:    Pt tried to quit - helps with anxiety     1 pack last patient 3 days   Vaping Use   Vaping status: Never Used  Substance Use Topics   Alcohol use: Yes    Comment: occasionally - last drink was July 4, 1 shot   Drug use: No     Allergies   Hydroxyzine and Ambien [zolpidem tartrate]   Review of Systems Review of Systems  Constitutional:  Negative for chills and fever.  Eyes:  Negative for discharge and redness.  Respiratory:  Negative for shortness of breath.   Gastrointestinal:  Positive for abdominal pain and nausea. Negative for diarrhea and vomiting.  Neurological:  Positive for headaches.     Physical Exam Triage Vital Signs ED Triage Vitals  Encounter Vitals Group     BP 10/17/22  1701 120/79     Systolic BP Percentile --      Diastolic BP Percentile --      Pulse Rate 10/17/22 1701 87     Resp 10/17/22 1701 16     Temp 10/17/22 1701 98  F (36.7 C)     Temp Source 10/17/22 1701 Oral     SpO2 10/17/22 1701 94 %     Weight --      Height --      Head Circumference --      Peak Flow --      Pain Score 10/17/22 1702 10     Pain Loc --      Pain Education --      Exclude from Growth Chart --    No data found.  Updated Vital Signs BP 120/79 (BP Location: Left Arm)   Pulse 87   Temp 98 F (36.7 C) (Oral)   Resp 16   SpO2 94%      Physical Exam Vitals and nursing note reviewed.  Constitutional:      General: She is in acute distress (appears to be in pain- rocking back and forth).     Appearance: She is not ill-appearing.  HENT:     Head: Normocephalic and atraumatic.  Eyes:     Conjunctiva/sclera: Conjunctivae normal.  Cardiovascular:     Rate and Rhythm: Normal rate and regular rhythm.     Heart sounds: Normal heart sounds.  Pulmonary:     Effort: Pulmonary effort is normal. No respiratory distress.     Breath sounds: Normal breath sounds. No wheezing, rhonchi or rales.  Abdominal:     General: There is distension (mild).     Tenderness: There is abdominal tenderness (epigastric- LUQ). There is no guarding.  Neurological:     Mental Status: She is alert.  Psychiatric:        Mood and Affect: Mood normal.        Behavior: Behavior normal.        Thought Content: Thought content normal.      UC Treatments / Results  Labs (all labs ordered are listed, but only abnormal results are displayed) Labs Reviewed - No data to display  EKG   Radiology No results found.  Procedures Procedures (including critical care time)  Medications Ordered in UC Medications - No data to display  Initial Impression / Assessment and Plan / UC Course  I have reviewed the triage vital signs and the nursing notes.  Pertinent labs & imaging results that  were available during my care of the patient were reviewed by me and considered in my medical decision making (see chart for details).    Given recent hospitalization for epigastric pain with bleeding gastric ulcer recommended further evaluation the emergency room.  Patient reports she does not have a way there and would like to have EMS transport.  This seems reasonable given significance of pain and concern for GI bleed.  EMS contacted for transport.  Final Clinical Impressions(s) / UC Diagnoses   Final diagnoses:  Abdominal pain, epigastric   Discharge Instructions   None    ED Prescriptions   None    PDMP not reviewed this encounter.   Tomi Bamberger, PA-C 10/17/22 1711

## 2022-10-17 NOTE — ED Notes (Signed)
Attempted IV x1 patient said it was hurting so line removed.

## 2022-10-18 DIAGNOSIS — K5792 Diverticulitis of intestine, part unspecified, without perforation or abscess without bleeding: Secondary | ICD-10-CM

## 2022-10-18 DIAGNOSIS — K5732 Diverticulitis of large intestine without perforation or abscess without bleeding: Secondary | ICD-10-CM | POA: Diagnosis not present

## 2022-10-18 LAB — CBC
HCT: 37.7 % (ref 36.0–46.0)
Hemoglobin: 11.6 g/dL — ABNORMAL LOW (ref 12.0–15.0)
MCH: 26.9 pg (ref 26.0–34.0)
MCHC: 30.8 g/dL (ref 30.0–36.0)
MCV: 87.5 fL (ref 80.0–100.0)
Platelets: 575 10*3/uL — ABNORMAL HIGH (ref 150–400)
RBC: 4.31 MIL/uL (ref 3.87–5.11)
RDW: 18.5 % — ABNORMAL HIGH (ref 11.5–15.5)
WBC: 14.3 10*3/uL — ABNORMAL HIGH (ref 4.0–10.5)
nRBC: 0 % (ref 0.0–0.2)

## 2022-10-18 LAB — COMPREHENSIVE METABOLIC PANEL
ALT: 24 U/L (ref 0–44)
AST: 19 U/L (ref 15–41)
Albumin: 3.8 g/dL (ref 3.5–5.0)
Alkaline Phosphatase: 63 U/L (ref 38–126)
Anion gap: 10 (ref 5–15)
BUN: 13 mg/dL (ref 6–20)
CO2: 20 mmol/L — ABNORMAL LOW (ref 22–32)
Calcium: 8.9 mg/dL (ref 8.9–10.3)
Chloride: 105 mmol/L (ref 98–111)
Creatinine, Ser: 0.99 mg/dL (ref 0.44–1.00)
GFR, Estimated: >60 mL/min (ref >60)
Glucose, Bld: 88 mg/dL (ref 70–99)
Potassium: 3.7 mmol/L (ref 3.5–5.1)
Sodium: 135 mmol/L (ref 135–145)
Total Bilirubin: 0.4 mg/dL (ref 0.3–1.2)
Total Protein: 7.8 g/dL (ref 6.5–8.1)

## 2022-10-18 MED ORDER — SODIUM CHLORIDE 0.9 % IV SOLN: INTRAVENOUS | Status: DC

## 2022-10-18 MED ORDER — SODIUM CHLORIDE 0.9 % IV SOLN: 2.0000 g | INTRAVENOUS | Status: DC

## 2022-10-18 MED ORDER — LORATADINE 10 MG PO TABS: 10.0000 mg | ORAL_TABLET | Freq: Every day | ORAL | Status: DC | PRN

## 2022-10-18 MED ORDER — SUVOREXANT 20 MG PO TABS: 1.0000 | ORAL_TABLET | Freq: Every day | ORAL | Status: DC

## 2022-10-18 MED ORDER — PREGABALIN 50 MG PO CAPS
75.0000 mg | ORAL_CAPSULE | Freq: Two times a day (BID) | ORAL | Status: DC
  Administered 2022-10-18: 75 mg via ORAL
  Filled 2022-10-18: qty 1

## 2022-10-18 MED ORDER — AMLODIPINE BESYLATE 5 MG PO TABS: 10.0000 mg | ORAL_TABLET | Freq: Every day | ORAL | Status: DC

## 2022-10-18 MED ORDER — HYDROCODONE-ACETAMINOPHEN 5-325 MG PO TABS: 1.0000 | ORAL_TABLET | ORAL | Status: DC | PRN

## 2022-10-18 MED ORDER — METRONIDAZOLE 500 MG/100ML IV SOLN: 500.0000 mg | Freq: Two times a day (BID) | INTRAVENOUS | Status: DC

## 2022-10-18 MED ORDER — AMOXICILLIN-POT CLAVULANATE 875-125 MG PO TABS: 1.0000 | ORAL_TABLET | Freq: Two times a day (BID) | ORAL | 0 refills | Status: AC

## 2022-10-18 MED ORDER — MORPHINE SULFATE (PF) 2 MG/ML IV SOLN
1.0000 mg | Freq: Four times a day (QID) | INTRAVENOUS | Status: DC | PRN
  Administered 2022-10-18: 1 mg via INTRAVENOUS
  Filled 2022-10-18: qty 1

## 2022-10-18 MED ORDER — ONDANSETRON HCL 4 MG PO TABS: 4.0000 mg | ORAL_TABLET | Freq: Four times a day (QID) | ORAL | 0 refills | Status: DC | PRN

## 2022-10-18 MED ORDER — HYDROCODONE-ACETAMINOPHEN 5-325 MG PO TABS: 1.0000 | ORAL_TABLET | Freq: Three times a day (TID) | ORAL | 0 refills | Status: DC | PRN

## 2022-10-18 MED ORDER — FLUOXETINE HCL 20 MG PO CAPS
20.0000 mg | ORAL_CAPSULE | Freq: Every day | ORAL | Status: DC
  Administered 2022-10-18: 20 mg via ORAL
  Filled 2022-10-18: qty 1

## 2022-10-18 MED ORDER — HYDRALAZINE HCL 20 MG/ML IJ SOLN: 10.0000 mg | Freq: Four times a day (QID) | INTRAMUSCULAR | Status: DC | PRN

## 2022-10-18 MED ORDER — ONDANSETRON HCL 4 MG PO TABS: 4.0000 mg | ORAL_TABLET | Freq: Four times a day (QID) | ORAL | Status: DC | PRN

## 2022-10-18 MED ORDER — POLYETHYLENE GLYCOL 3350 17 G PO PACK: 17.0000 g | PACK | Freq: Every day | ORAL | Status: DC | PRN

## 2022-10-18 MED ORDER — ONDANSETRON HCL 4 MG/2ML IJ SOLN
4.0000 mg | Freq: Four times a day (QID) | INTRAMUSCULAR | Status: DC | PRN
  Administered 2022-10-18: 4 mg via INTRAVENOUS
  Filled 2022-10-18: qty 2

## 2022-10-18 MED ORDER — HYDROCHLOROTHIAZIDE 12.5 MG PO TABS: 25.0000 mg | ORAL_TABLET | Freq: Every day | ORAL | Status: DC

## 2022-10-18 MED ORDER — TRAZODONE HCL 50 MG PO TABS: 25.0000 mg | ORAL_TABLET | Freq: Every evening | ORAL | Status: DC | PRN

## 2022-10-18 MED ORDER — SODIUM CHLORIDE 0.9% FLUSH
3.0000 mL | Freq: Two times a day (BID) | INTRAVENOUS | Status: DC
  Administered 2022-10-18: 3 mL via INTRAVENOUS

## 2022-10-18 MED ORDER — ACETAMINOPHEN 650 MG RE SUPP: 650.0000 mg | Freq: Four times a day (QID) | RECTAL | Status: DC | PRN

## 2022-10-18 MED ORDER — ACETAMINOPHEN 325 MG PO TABS: 650.0000 mg | ORAL_TABLET | Freq: Four times a day (QID) | ORAL | Status: DC | PRN

## 2022-10-18 MED ORDER — AMLODIPINE BESYLATE 5 MG PO TABS
10.0000 mg | ORAL_TABLET | Freq: Every day | ORAL | Status: DC
  Administered 2022-10-18: 10 mg via ORAL
  Filled 2022-10-18: qty 2

## 2022-10-18 MED ORDER — BENAZEPRIL HCL 20 MG PO TABS
40.0000 mg | ORAL_TABLET | Freq: Every day | ORAL | Status: DC
  Administered 2022-10-18: 40 mg via ORAL
  Filled 2022-10-18: qty 2

## 2022-10-18 MED ORDER — PANTOPRAZOLE SODIUM 40 MG PO TBEC
40.0000 mg | DELAYED_RELEASE_TABLET | Freq: Two times a day (BID) | ORAL | Status: DC
  Administered 2022-10-18: 40 mg via ORAL
  Filled 2022-10-18: qty 1

## 2022-10-18 NOTE — Discharge Summary (Signed)
Physician Discharge Summary   Patient: Natalie Edwards MRN: [id number] DOB: [birth year]  Admit date:     10/17/2022  Discharge date: 10/18/22  Discharge Physician: Alberteen Sam   PCP: [name], MD     Recommendations at discharge:  Follow-up with PCP Dr. Andrey Campanile in 1 week for uncomplicated diverticulitis     Discharge Diagnoses: Principal Problem:   Acute diverticulitis Other Hospital problems: Hypertension    Hospital Course: Natalie Edwards is a 57 y.o. F with history HTN, recent admission for bleeding gastric ulcer, who presented with 1 day history of left-sided abdominal pain, and greenish stool.  In the ER, CT abdomen and pelvis showed uncomplicated diverticulitis, she was afebrile, white count 13.7.     Diverticulitis The patient was started on Rocephin and Flagyl, and observed overnight.  In the morning she was tolerating oral diet well, her pain was resolved, and she felt stable for discharge home with oral antibiotics.  She was discharged home with 7 days of Augmentin and close PCP follow-up.           The [facility] Controlled Substances Registry was reviewed for this patient prior to discharge.   Procedures performed: CT abdomen and pelvis  Disposition: Home Diet recommendation:  Regular diet  DISCHARGE MEDICATION: Allergies as of 10/18/2022       Reactions   Hydroxyzine Other (See Comments)   Palpitations and jitteriness    Ambien [zolpidem Tartrate] Other (See Comments)   Jitteriness, nervousness, abdominal pain        Medication List     STOP taking these medications    amLODipine 10 MG tablet Commonly known as: NORVASC   hydrochlorothiazide 25 MG tablet Commonly known as: HYDRODIURIL       TAKE these medications    acetaminophen 325 MG tablet Commonly known as: TYLENOL Take 2 tablets (650 mg total) by mouth every 6 (six) hours as needed for mild pain (or Fever >/= 101).   amLODipine-benazepril 10-40 MG  capsule Commonly known as: LOTREL TAKE 1 CAPSULE BY MOUTH DAILY   amoxicillin-clavulanate 875-125 MG tablet Commonly known as: AUGMENTIN Take 1 tablet by mouth 2 (two) times daily for 6 days.   Belsomra 20 MG Tabs Generic drug: Suvorexant Take 1 tablet (20 mg total) by mouth at bedtime.   diphenhydramine-acetaminophen 25-500 MG Tabs tablet Commonly known as: TYLENOL PM Take 1 tablet by mouth at bedtime as needed (pain).   FLUoxetine 20 MG capsule Commonly known as: PROZAC Take 20 mg by mouth daily.   HYDROcodone-acetaminophen 5-325 MG tablet Commonly known as: NORCO/VICODIN Take 1 tablet by mouth every 8 (eight) hours as needed for moderate pain.   loratadine 10 MG tablet Commonly known as: CLARITIN Take 1 tablet (10 mg total) by mouth daily as needed for allergies.   metoCLOPramide 10 MG tablet Commonly known as: REGLAN Take 1 tablet (10 mg total) by mouth every 6 (six) hours.   mirtazapine 45 MG tablet Commonly known as: REMERON Take 1 tablet (45 mg total) by mouth at bedtime.   ondansetron 4 MG tablet Commonly known as: ZOFRAN Take 1 tablet (4 mg total) by mouth every 6 (six) hours as needed for nausea.   pantoprazole 40 MG tablet Commonly known as: Protonix Take 1 tablet (40 mg total) by mouth 2 (two) times daily.   polyethylene glycol 17 g packet Commonly known as: MIRALAX / GLYCOLAX Take 17 g by mouth daily as needed for mild constipation.   temazepam 30 MG capsule Commonly known  as: RESTORIL Take 1 capsule (30 mg total) by mouth at bedtime as needed. What changed: when to take this        Follow-up Information     [name], MD. Schedule an appointment as soon as possible for a visit in 1 week(s).   Specialty: Family Medicine Contact information: [address] [place] [address] [phone]                 Discharge Instructions     Discharge instructions   Complete by: As directed    **IMPORTANT DISCHARGE  INSTRUCTIONS**   From Dr. Maryfrances Bunnell: You were admitted for diverticulitis Diverticulitis is a form of inflammation and often infection of the colon   You should take hte antibiotic Augmentin 875-125 mg twice daily for 6 more days starting tonight (to complete a 7 day course)  Go see Dr. Andrey Campanile THIS WEEK Call her office on Monday to schedule an appointment  For pain, you may take hydrocodone-acetaminophen, up to three times daily Do not drive while taking Do not take with Belsomra or other sleeping pills  Eat a diet that avoids nuts, seeds, popcorn, peanuts, etc.   Increase activity slowly   Complete by: As directed        Discharge Exam: Filed Weights   10/17/22 1815  Weight: 82 kg    General: Pt is alert, awake, not in acute distress Cardiovascular: RRR, nl S1-S2, no murmurs appreciated.   No LE edema.   Respiratory: Normal respiratory rate and rhythm.  CTAB without rales or wheezes. Abdominal: Mild left-sided abdominal tenderness, no rigidity, no guarding no distension or HSM.   Neuro/Psych: Strength symmetric in upper and lower extremities.  Judgment and insight appear normal.   Condition at discharge: good  The results of significant diagnostics from this hospitalization (including imaging, microbiology, ancillary and laboratory) are listed below for reference.   Imaging Studies: CT ABDOMEN PELVIS W CONTRAST  Result Date: 10/17/2022 CLINICAL DATA:  Mid abdominal pain, nausea since yesterday EXAM: CT ABDOMEN AND PELVIS WITH CONTRAST TECHNIQUE: Multidetector CT imaging of the abdomen and pelvis was performed using the standard protocol following bolus administration of intravenous contrast. RADIATION DOSE REDUCTION: This exam was performed according to the departmental dose-optimization program which includes automated exposure control, adjustment of the mA and/or kV according to patient size and/or use of iterative reconstruction technique. CONTRAST:  OMNIPAQUE  IOHEXOL 300 MG/ML  SOLN COMPARISON:  09/28/2022 FINDINGS: Lower chest: No acute pleural or parenchymal lung disease. Hepatobiliary: No focal liver abnormality is seen. Status post cholecystectomy. No biliary dilatation. Pancreas: Unremarkable. No pancreatic ductal dilatation or surrounding inflammatory changes. Spleen: Normal in size without focal abnormality. Adrenals/Urinary Tract: Stable simple bilateral renal cortical and peripelvic cysts which do not require specific imaging follow-up. Otherwise the kidneys enhance normally. No urinary tract calculi or obstructive uropathy within either kidney. Stable 1.4 cm left adrenal nodule, previously characterized as adenoma. Right adrenal and bladder are unremarkable. Stomach/Bowel: No bowel obstruction or ileus. Normal appendix within the central upper pelvis. Diverticulosis of the descending and sigmoid colon, with interval development of segmental wall thickening and pericolonic fat stranding involving the distal descending and proximal sigmoid colon, consistent with acute uncomplicated diverticulitis. No perforation, fluid collection, or abscess. Vascular/Lymphatic: Aortic atherosclerosis. No enlarged abdominal or pelvic lymph nodes. Reproductive: Status post hysterectomy. No adnexal masses. Other: Trace pelvic free fluid, nonspecific. No free intraperitoneal gas. No abdominal wall hernia. Musculoskeletal: No acute or destructive bony abnormalities. Reconstructed images demonstrate  no additional findings. IMPRESSION: 1. Acute uncomplicated diverticulitis of the distal descending and proximal sigmoid colon. No perforation, fluid collection, or abscess. 2.  Aortic Atherosclerosis (ICD10-I70.0). Electronically Signed   By: Sharlet Salina M.D.   On: 10/17/2022 20:36   CT ABDOMEN PELVIS W CONTRAST  Result Date: 09/28/2022 CLINICAL DATA:  Abdominal pain and nausea. EXAM: CT ABDOMEN AND PELVIS WITH CONTRAST TECHNIQUE: Multidetector CT imaging of the abdomen and pelvis  was performed using the standard protocol following bolus administration of intravenous contrast. RADIATION DOSE REDUCTION: This exam was performed according to the departmental dose-optimization program which includes automated exposure control, adjustment of the mA and/or kV according to patient size and/or use of iterative reconstruction technique. CONTRAST:  65mL OMNIPAQUE IOHEXOL 350 MG/ML SOLN COMPARISON:  CT abdomen pelvis dated Jul 31, 2016. FINDINGS: Lower chest: No acute abnormality. Hepatobiliary: No focal liver abnormality is seen. Status post cholecystectomy. Unchanged mildly prominent common bile duct due to reservoir effect. Pancreas: Unremarkable. No pancreatic ductal dilatation or surrounding inflammatory changes. Spleen: Normal in size without focal abnormality. Adrenals/Urinary Tract: Unchanged 1.7 cm left adrenal adenoma. No follow-up imaging is recommended. Normal right adrenal gland. Bilateral renal simple cysts again noted. No follow-up imaging is recommended. No renal calculi or hydronephrosis. The bladder is unremarkable. Stomach/Bowel: Stomach is within normal limits. Appendix appears normal. No evidence of bowel wall thickening, distention, or inflammatory changes. Left-sided colonic diverticulosis. Vascular/Lymphatic: Aortic atherosclerosis. No enlarged abdominal or pelvic lymph nodes. Reproductive: Status post hysterectomy. No adnexal masses. Other: Small fat containing midline supraumbilical hernia has slightly increased in size. No free fluid or pneumoperitoneum. Musculoskeletal: No acute or significant osseous findings. IMPRESSION: 1. No acute intra-abdominal process. 2.  Aortic Atherosclerosis (ICD10-I70.0). Electronically Signed   By: Obie Dredge M.D.   On: 09/28/2022 10:56   DG Chest Port 1 View  Result Date: 09/28/2022 CLINICAL DATA:  57 year old female with possible sepsis. EXAM: PORTABLE CHEST 1 VIEW COMPARISON:  Chest x-ray 08/30/2022. FINDINGS: Lung volumes are normal.  No consolidative airspace disease. No pleural effusions. No pneumothorax. No pulmonary nodule or mass noted. Pulmonary vasculature and the cardiomediastinal silhouette are within normal limits. IMPRESSION: No radiographic evidence of acute cardiopulmonary disease. Electronically Signed   By: Trudie Reed M.D.   On: 09/28/2022 08:43    Microbiology: Results for orders placed or performed during the hospital encounter of 09/28/22  Resp panel by RT-PCR (RSV, Flu A&B, Covid) Anterior Nasal Swab     Status: None   Collection Time: 09/28/22  8:04 AM   Specimen: Anterior Nasal Swab  Result Value Ref Range Status   SARS Coronavirus 2 by RT PCR NEGATIVE NEGATIVE Final   Influenza A by PCR NEGATIVE NEGATIVE Final   Influenza B by PCR NEGATIVE NEGATIVE Final    Comment: (NOTE) The Xpert Xpress SARS-CoV-2/FLU/RSV plus assay is intended as an aid in the diagnosis of influenza from Nasopharyngeal swab specimens and should not be used as a sole basis for treatment. Nasal washings and aspirates are unacceptable for Xpert Xpress SARS-CoV-2/FLU/RSV testing.  Fact Sheet for Patients: BloggerCourse.com  Fact Sheet for Healthcare Providers: SeriousBroker.it  This test is not yet approved or cleared by the [place] FDA and has been authorized for detection and/or diagnosis of SARS-CoV-2 by FDA under an Emergency Use Authorization (EUA). This EUA will remain in effect (meaning this test can be used) for the duration of the COVID-19 declaration under Section 564(b)(1) of the Act, 21 U.S.C. section 360bbb-3(b)(1), unless the authorization is terminated or revoked.  Resp Syncytial Virus by PCR NEGATIVE NEGATIVE Final    Comment: (NOTE) Fact Sheet for Patients: BloggerCourse.com  Fact Sheet for Healthcare Providers: SeriousBroker.it  This test is not yet approved or cleared by the [place]  FDA and has been authorized for detection and/or diagnosis of SARS-CoV-2 by FDA under an Emergency Use Authorization (EUA). This EUA will remain in effect (meaning this test can be used) for the duration of the COVID-19 declaration under Section 564(b)(1) of the Act, 21 U.S.C. section 360bbb-3(b)(1), unless the authorization is terminated or revoked.  Performed at [facility], [address], [place], [place] [address]   Blood Culture (routine x 2)     Status: None   Collection Time: 09/28/22  8:33 AM   Specimen: BLOOD LEFT ARM  Result Value Ref Range Status   Specimen Description BLOOD LEFT ARM  Final   Special Requests   Final    BOTTLES DRAWN AEROBIC AND ANAEROBIC Blood Culture results may not be optimal due to an inadequate volume of blood received in culture bottles   Culture   Final    NO GROWTH 5 DAYS Performed at [facility], [address] [place], [place] [address]    Report Status 10/03/2022 FINAL  Final  Blood Culture (routine x 2)     Status: None   Collection Time: 09/28/22  8:38 AM   Specimen: BLOOD RIGHT HAND  Result Value Ref Range Status   Specimen Description BLOOD RIGHT HAND  Final   Special Requests   Final    BOTTLES DRAWN AEROBIC AND ANAEROBIC Blood Culture results may not be optimal due to an inadequate volume of blood received in culture bottles   Culture   Final    NO GROWTH 5 DAYS Performed at [facility], [address], [place], [place] [address]    Report Status 10/03/2022 FINAL  Final    Labs: CBC: Recent Labs  Lab 10/17/22 1844 10/18/22 0554  WBC 13.7* 14.3*  NEUTROABS 7.5  --   HGB 11.1* 11.6*  HCT 35.2* 37.7  MCV 85.4 87.5  PLT 621* 575*   Basic Metabolic Panel: Recent Labs  Lab 10/17/22 1844 10/18/22 0554  NA 136 135  K 4.1 3.7  CL 106 105  CO2 21* 20*  GLUCOSE 84 88  BUN 15 13  CREATININE 1.05* 0.99  CALCIUM 9.1 8.9   Liver Function Tests: Recent Labs  Lab 10/17/22 1844 10/18/22 0554  AST 22  19  ALT 25 24  ALKPHOS 59 63  BILITOT 0.5 0.4  PROT 7.7 7.8  ALBUMIN 3.7 3.8   CBG: No results for input(s): "GLUCAP" in the last 168 hours.  Discharge time spent: approximately 35 minutes spent on discharge counseling, evaluation of patient on day of discharge, and coordination of discharge planning with nursing, social work, pharmacy and case management  Signed: Alberteen Sam, MD [facility] 10/18/2022

## 2022-10-20 ENCOUNTER — Encounter (HOSPITAL_COMMUNITY): Payer: Self-pay

## 2022-10-20 ENCOUNTER — Ambulatory Visit: Payer: Self-pay | Admitting: *Deleted

## 2022-10-20 ENCOUNTER — Emergency Department (HOSPITAL_COMMUNITY)
Admission: EM | Admit: 2022-10-20 | Discharge: 2022-10-20 | Discharge status: Against Medical Advice | Payer: 59 | Attending: Emergency Medicine | Admitting: Emergency Medicine

## 2022-10-20 ENCOUNTER — Other Ambulatory Visit: Payer: Self-pay

## 2022-10-20 ENCOUNTER — Emergency Department (HOSPITAL_COMMUNITY): Payer: 59

## 2022-10-20 DIAGNOSIS — R110 Nausea: Secondary | ICD-10-CM | POA: Diagnosis not present

## 2022-10-20 DIAGNOSIS — K5732 Diverticulitis of large intestine without perforation or abscess without bleeding: Secondary | ICD-10-CM | POA: Diagnosis not present

## 2022-10-20 DIAGNOSIS — K5792 Diverticulitis of intestine, part unspecified, without perforation or abscess without bleeding: Secondary | ICD-10-CM | POA: Diagnosis not present

## 2022-10-20 DIAGNOSIS — Z5321 Procedure and treatment not carried out due to patient leaving prior to being seen by health care provider: Secondary | ICD-10-CM | POA: Insufficient documentation

## 2022-10-20 DIAGNOSIS — R109 Unspecified abdominal pain: Secondary | ICD-10-CM | POA: Diagnosis not present

## 2022-10-20 DIAGNOSIS — R197 Diarrhea, unspecified: Secondary | ICD-10-CM | POA: Insufficient documentation

## 2022-10-20 DIAGNOSIS — R1032 Left lower quadrant pain: Secondary | ICD-10-CM | POA: Insufficient documentation

## 2022-10-20 DIAGNOSIS — Z743 Need for continuous supervision: Secondary | ICD-10-CM | POA: Diagnosis not present

## 2022-10-20 DIAGNOSIS — N179 Acute kidney failure, unspecified: Secondary | ICD-10-CM | POA: Diagnosis not present

## 2022-10-20 DIAGNOSIS — K838 Other specified diseases of biliary tract: Secondary | ICD-10-CM | POA: Diagnosis not present

## 2022-10-20 LAB — COMPREHENSIVE METABOLIC PANEL
ALT: 21 U/L (ref 0–44)
AST: 17 U/L (ref 15–41)
Albumin: 3.7 g/dL (ref 3.5–5.0)
Alkaline Phosphatase: 55 U/L (ref 38–126)
Anion gap: 11 (ref 5–15)
BUN: 9 mg/dL (ref 6–20)
CO2: 18 mmol/L — ABNORMAL LOW (ref 22–32)
Calcium: 9.3 mg/dL (ref 8.9–10.3)
Chloride: 109 mmol/L (ref 98–111)
Creatinine, Ser: 1.14 mg/dL — ABNORMAL HIGH (ref 0.44–1.00)
GFR, Estimated: 56 mL/min — ABNORMAL LOW (ref >60)
Glucose, Bld: 75 mg/dL (ref 70–99)
Potassium: 3.6 mmol/L (ref 3.5–5.1)
Sodium: 138 mmol/L (ref 135–145)
Total Bilirubin: 0.3 mg/dL (ref 0.3–1.2)
Total Protein: 7.2 g/dL (ref 6.5–8.1)

## 2022-10-20 LAB — CBC
HCT: 36 % (ref 36.0–46.0)
Hemoglobin: 11.4 g/dL — ABNORMAL LOW (ref 12.0–15.0)
MCH: 27.4 pg (ref 26.0–34.0)
MCHC: 31.7 g/dL (ref 30.0–36.0)
MCV: 86.5 fL (ref 80.0–100.0)
Platelets: 721 10*3/uL — ABNORMAL HIGH (ref 150–400)
RBC: 4.16 MIL/uL (ref 3.87–5.11)
RDW: 18.3 % — ABNORMAL HIGH (ref 11.5–15.5)
WBC: 13.7 10*3/uL — ABNORMAL HIGH (ref 4.0–10.5)
nRBC: 0 % (ref 0.0–0.2)

## 2022-10-20 LAB — LIPASE, BLOOD: Lipase: 26 U/L (ref 11–51)

## 2022-10-20 MED ORDER — IOHEXOL 350 MG/ML SOLN
75.0000 mL | Freq: Once | INTRAVENOUS | Status: AC | PRN
  Administered 2022-10-20: 75 mL via INTRAVENOUS

## 2022-10-20 MED ORDER — HYDROMORPHONE HCL 1 MG/ML IJ SOLN
1.0000 mg | Freq: Once | INTRAMUSCULAR | Status: AC
  Administered 2022-10-20: 1 mg via INTRAVENOUS
  Filled 2022-10-20: qty 1

## 2022-10-20 NOTE — ED Notes (Signed)
Pt expressed they did not want to wait anymore and asked this NT to remove their PIV.

## 2022-10-20 NOTE — Telephone Encounter (Signed)
  Chief Complaint: Released from hospital on Sat. With a new diagnoses of diverticulitis. Symptoms: Still having severe nausea.   "None of the medications they gave me for pain or nausea are helping at all".   Pt is crying and c/o the severe nausea.   "I have not eaten anything".   Frequency: Since coming home from the hospital on Sat. Morning.  The nausea was under control in the hospital but came back after she was released. Pertinent Negatives: Patient denies vomiting. Disposition: [x] ED /[] Urgent Care (no appt availability in office) / [] Appointment(In office/virtual)/ []  [facility]/ [] Home Care/ [] Refused Recommended Disposition /[] [facility]/ []  Follow-up with PCP Additional Notes: I have referred her to the ED which she is agreeable to going.   She does not have transportation so I let her know she could call 911 to take her.   She was agreeable to doing that.  Message sent to Dr. Andrey Campanile.

## 2022-10-20 NOTE — ED Triage Notes (Signed)
Pt BIB [facility] from home d/t Lt sided abd pain d/t recent Dx of Diverticulitis while at [facility] this past Fri. Pt states she has progressively felt worse since then. Has been nauseated, no emesis or diarrhea. A/Ox4, no PIV.

## 2022-10-20 NOTE — ED Provider Triage Note (Signed)
Emergency Medicine Provider Triage Evaluation Note  Natalie Edwards , a 57 y.o. female  was evaluated in triage.  Pt complains of left sided abdominal pain, found to have diverticulitis a couple days ago. Says pain meds and antibiotics are not working and the pain is getting worse. Endorses diarrhea yesterday.   Review of Systems  Positive: As above Negative: As above  Physical Exam  BP 131/80 (BP Location: Right Arm)   Pulse 91   Temp 98.7 F (37.1 C) (Oral)   Resp 18   SpO2 96%  Gen:   Awake, in pain Resp:  Normal effort  MSK:   Moves extremities without difficulty  LLQ TTP  Medical Decision Making  Medically screening exam initiated at 4:51 PM.  Appropriate orders placed.  Natalie Edwards was informed that the remainder of the evaluation will be completed by another provider, this initial triage assessment does not replace that evaluation, and the importance of remaining in the ED until their evaluation is complete.     [name], PA-C 10/20/22 1653

## 2022-10-20 NOTE — Telephone Encounter (Signed)
Reason for Disposition  Patient sounds very sick or weak to the triager  Answer Assessment - Initial Assessment Questions 1. NAUSEA SEVERITY: "How bad is the nausea?" (e.g., mild, moderate, severe; dehydration, weight loss)   - MILD: loss of appetite without change in eating habits   - MODERATE: decreased oral intake without significant weight loss, dehydration, or malnutrition   - SEVERE: inadequate caloric or fluid intake, significant weight loss, symptoms of dehydration     I'm having a lot of nausea.   I was released from  the hospital Sat. Morning from [facility].    I had diverticulitis.   This is a new diagnoses. Pt is crying and saying she is feeling bad.   The hospital said not to come back unless I was vomiting.   2. ONSET: "When did the nausea begin?"     It never stopped the nausea.   It stopped in the hospital.   But when I got home the nausea started back.   None of the medicine is working. 3. VOMITING: "Any vomiting?" If Yes, ask: "How many times today?"     No 4. RECURRENT SYMPTOM: "Have you had nausea before?" If Yes, ask: "When was the last time?" "What happened that time?"     This is a new diagnoses.    I don't know what else to do.    I don't have transportation to her office. 5. CAUSE: "What do you think is causing the nausea?"     Diverticulitis.  6. PREGNANCY: "Is there any chance you are pregnant?" (e.g., unprotected intercourse, missed birth control pill, broken condom)     Not asked  Protocols used: Nausea-A-AH

## 2022-10-21 ENCOUNTER — Encounter (HOSPITAL_COMMUNITY): Payer: Self-pay

## 2022-10-21 ENCOUNTER — Emergency Department (HOSPITAL_COMMUNITY)
Admission: EM | Admit: 2022-10-21 | Discharge: 2022-10-21 | Disposition: A | Discharge status: Home / Self Care | Payer: 59 | Attending: Emergency Medicine | Admitting: Emergency Medicine

## 2022-10-21 ENCOUNTER — Other Ambulatory Visit: Payer: Self-pay

## 2022-10-21 ENCOUNTER — Emergency Department (HOSPITAL_COMMUNITY): Payer: 59

## 2022-10-21 DIAGNOSIS — D649 Anemia, unspecified: Secondary | ICD-10-CM | POA: Insufficient documentation

## 2022-10-21 DIAGNOSIS — Z79899 Other long term (current) drug therapy: Secondary | ICD-10-CM | POA: Insufficient documentation

## 2022-10-21 DIAGNOSIS — R110 Nausea: Secondary | ICD-10-CM | POA: Insufficient documentation

## 2022-10-21 DIAGNOSIS — I10 Essential (primary) hypertension: Secondary | ICD-10-CM | POA: Insufficient documentation

## 2022-10-21 DIAGNOSIS — D72829 Elevated white blood cell count, unspecified: Secondary | ICD-10-CM | POA: Diagnosis not present

## 2022-10-21 DIAGNOSIS — R1084 Generalized abdominal pain: Secondary | ICD-10-CM | POA: Insufficient documentation

## 2022-10-21 DIAGNOSIS — R112 Nausea with vomiting, unspecified: Secondary | ICD-10-CM | POA: Diagnosis not present

## 2022-10-21 DIAGNOSIS — R1012 Left upper quadrant pain: Secondary | ICD-10-CM | POA: Diagnosis present

## 2022-10-21 DIAGNOSIS — K5730 Diverticulosis of large intestine without perforation or abscess without bleeding: Secondary | ICD-10-CM | POA: Diagnosis not present

## 2022-10-21 DIAGNOSIS — K5792 Diverticulitis of intestine, part unspecified, without perforation or abscess without bleeding: Secondary | ICD-10-CM | POA: Diagnosis not present

## 2022-10-21 DIAGNOSIS — K7689 Other specified diseases of liver: Secondary | ICD-10-CM | POA: Diagnosis not present

## 2022-10-21 LAB — URINALYSIS, ROUTINE W REFLEX MICROSCOPIC
Bilirubin Urine: NEGATIVE
Glucose, UA: NEGATIVE mg/dL
Hgb urine dipstick: NEGATIVE
Ketones, ur: 20 mg/dL — AB
Leukocytes,Ua: NEGATIVE
Nitrite: NEGATIVE
Protein, ur: 30 mg/dL — AB
Specific Gravity, Urine: 1.021 (ref 1.005–1.030)
pH: 5 (ref 5.0–8.0)

## 2022-10-21 LAB — COMPREHENSIVE METABOLIC PANEL
ALT: 22 U/L (ref 0–44)
AST: 18 U/L (ref 15–41)
Albumin: 3.9 g/dL (ref 3.5–5.0)
Alkaline Phosphatase: 56 U/L (ref 38–126)
Anion gap: 10 (ref 5–15)
BUN: 9 mg/dL (ref 6–20)
CO2: 20 mmol/L — ABNORMAL LOW (ref 22–32)
Calcium: 9.2 mg/dL (ref 8.9–10.3)
Chloride: 108 mmol/L (ref 98–111)
Creatinine, Ser: 1.07 mg/dL — ABNORMAL HIGH (ref 0.44–1.00)
GFR, Estimated: >60 mL/min (ref >60)
Glucose, Bld: 75 mg/dL (ref 70–99)
Potassium: 3.7 mmol/L (ref 3.5–5.1)
Sodium: 138 mmol/L (ref 135–145)
Total Bilirubin: 0.4 mg/dL (ref 0.3–1.2)
Total Protein: 7.5 g/dL (ref 6.5–8.1)

## 2022-10-21 LAB — CBC WITH DIFFERENTIAL/PLATELET
Abs Immature Granulocytes: 0.06 10*3/uL (ref 0.00–0.07)
Basophils Absolute: 0 10*3/uL (ref 0.0–0.1)
Basophils Relative: 0 %
Eosinophils Absolute: 0.1 10*3/uL (ref 0.0–0.5)
Eosinophils Relative: 1 %
HCT: 35.2 % — ABNORMAL LOW (ref 36.0–46.0)
Hemoglobin: 11.1 g/dL — ABNORMAL LOW (ref 12.0–15.0)
Immature Granulocytes: 1 %
Lymphocytes Relative: 25 %
Lymphs Abs: 3 10*3/uL (ref 0.7–4.0)
MCH: 27.1 pg (ref 26.0–34.0)
MCHC: 31.5 g/dL (ref 30.0–36.0)
MCV: 86.1 fL (ref 80.0–100.0)
Monocytes Absolute: 0.8 10*3/uL (ref 0.1–1.0)
Monocytes Relative: 7 %
Neutro Abs: 8.2 10*3/uL — ABNORMAL HIGH (ref 1.7–7.7)
Neutrophils Relative %: 66 %
Platelets: 661 10*3/uL — ABNORMAL HIGH (ref 150–400)
RBC: 4.09 MIL/uL (ref 3.87–5.11)
RDW: 18.3 % — ABNORMAL HIGH (ref 11.5–15.5)
WBC: 12.2 10*3/uL — ABNORMAL HIGH (ref 4.0–10.5)
nRBC: 0 % (ref 0.0–0.2)

## 2022-10-21 LAB — LIPASE, BLOOD: Lipase: 25 U/L (ref 11–51)

## 2022-10-21 MED ORDER — LACTATED RINGERS IV BOLUS
1000.0000 mL | Freq: Once | INTRAVENOUS | Status: AC
  Administered 2022-10-21: 1000 mL via INTRAVENOUS

## 2022-10-21 MED ORDER — HYDROCODONE-ACETAMINOPHEN 5-325 MG PO TABS
1.0000 | ORAL_TABLET | Freq: Four times a day (QID) | ORAL | 0 refills | Status: AC | PRN
Start: 2022-10-21 — End: ?

## 2022-10-21 MED ORDER — FENTANYL CITRATE PF 50 MCG/ML IJ SOSY
50.0000 ug | PREFILLED_SYRINGE | Freq: Once | INTRAMUSCULAR | Status: AC
  Administered 2022-10-21: 50 ug via INTRAVENOUS
  Filled 2022-10-21: qty 1

## 2022-10-21 MED ORDER — PROCHLORPERAZINE EDISYLATE 10 MG/2ML IJ SOLN
10.0000 mg | Freq: Once | INTRAMUSCULAR | Status: AC
  Administered 2022-10-21: 10 mg via INTRAVENOUS
  Filled 2022-10-21: qty 2

## 2022-10-21 MED ORDER — PIPERACILLIN-TAZOBACTAM 3.375 G IVPB 30 MIN
3.3750 g | Freq: Once | INTRAVENOUS | Status: AC
  Administered 2022-10-21: 3.375 g via INTRAVENOUS
  Filled 2022-10-21: qty 50

## 2022-10-21 MED ORDER — HYDROCODONE-ACETAMINOPHEN 5-325 MG PO TABS
1.0000 | ORAL_TABLET | Freq: Once | ORAL | Status: AC
  Administered 2022-10-21: 1 via ORAL
  Filled 2022-10-21: qty 1

## 2022-10-21 MED ORDER — PIPERACILLIN-TAZOBACTAM 3.375 G IVPB: 3.3750 g | Freq: Three times a day (TID) | INTRAVENOUS | Status: DC

## 2022-10-21 MED ORDER — SODIUM CHLORIDE (PF) 0.9 % IJ SOLN
INTRAMUSCULAR | Status: AC
  Filled 2022-10-21: qty 50

## 2022-10-21 MED ORDER — ONDANSETRON HCL 4 MG/2ML IJ SOLN
4.0000 mg | Freq: Once | INTRAMUSCULAR | Status: AC
  Administered 2022-10-21: 4 mg via INTRAVENOUS
  Filled 2022-10-21: qty 2

## 2022-10-21 MED ORDER — IOHEXOL 300 MG/ML  SOLN
80.0000 mL | Freq: Once | INTRAMUSCULAR | Status: AC | PRN
  Administered 2022-10-21: 80 mL via INTRAVENOUS

## 2022-10-21 NOTE — ED Notes (Signed)
Pt is crying and upset, states nausea not controlled with zofran, request for different nausea med sent to HCP

## 2022-10-21 NOTE — ED Provider Notes (Signed)
[place] EMERGENCY DEPARTMENT AT  [facility] Provider Note   CSN: [id number] Arrival date & time: 10/21/22  4098     History  Chief Complaint  Patient presents with   Abdominal Pain    Natalie Edwards is a 57 y.o. female.   Abdominal Pain Associated symptoms: nausea      57 year old female with medical history significant for HTN, anemia, GERD, bleeding gastric ulcer, diverticulitis with recent diagnosis of diverticulitis treated with Augmentin who presents to the emergency department with severe abdominal pain and nausea.  On chart review, the patient was discharged from the hospital on 10/18/2022 after a 1 day admission with treatment for diverticulitis.  She was initially started on Rocephin and Flagyl and transition to Augmentin.  She was discharged home with a 7-day course of Augmentin and PCP follow-up.  She states that today she developed severe abdominal pain, located on the left side of the abdomen with associated nausea.  She ran out of her oxycodone at home yesterday. She endorses chills.   Home Medications Prior to Admission medications   Medication Sig Start Date End Date Taking? Authorizing Provider  HYDROcodone-acetaminophen (NORCO/VICODIN) 5-325 MG tablet Take 1-2 tablets by mouth every 6 (six) hours as needed. 10/21/22  Yes Ernie Avena, MD  acetaminophen (TYLENOL) 325 MG tablet Take 2 tablets (650 mg total) by mouth every 6 (six) hours as needed for mild pain (or Fever >/= 101). Patient not taking: Reported on 10/18/2022 09/30/22   Marguerita Merles Latif, DO  amLODipine-benazepril (LOTREL) 10-40 MG capsule TAKE 1 CAPSULE BY MOUTH DAILY 10/16/22 04/14/23  [name], MD  amoxicillin-clavulanate (AUGMENTIN) 875-125 MG tablet Take 1 tablet by mouth 2 (two) times daily for 6 days. 10/18/22 10/24/22  Danford, Earl Lites, MD  BELSOMRA 20 MG TABS Take 1 tablet (20 mg total) by mouth at bedtime. 09/01/22   Olalere, Onnie Boer A, MD  diphenhydramine-acetaminophen (TYLENOL PM)  25-500 MG TABS tablet Take 1 tablet by mouth at bedtime as needed (pain).    [provider]  FLUoxetine (PROZAC) 20 MG capsule Take 20 mg by mouth daily. 10/02/22   [provider]  loratadine (CLARITIN) 10 MG tablet Take 1 tablet (10 mg total) by mouth daily as needed for allergies. Patient not taking: Reported on 10/18/2022 09/24/22   Bing Neighbors, NP  metoCLOPramide (REGLAN) 10 MG tablet Take 1 tablet (10 mg total) by mouth every 6 (six) hours. Patient not taking: Reported on 10/18/2022 09/24/22   Bing Neighbors, NP  mirtazapine (REMERON) 45 MG tablet Take 1 tablet (45 mg total) by mouth at bedtime. 09/01/22   [name], MD  ondansetron (ZOFRAN) 4 MG tablet Take 1 tablet (4 mg total) by mouth every 6 (six) hours as needed for nausea. 10/18/22   Danford, Earl Lites, MD  pantoprazole (PROTONIX) 40 MG tablet Take 1 tablet (40 mg total) by mouth 2 (two) times daily. 09/30/22 10/30/22  Marguerita Merles Latif, DO  polyethylene glycol (MIRALAX / GLYCOLAX) 17 g packet Take 17 g by mouth daily as needed for mild constipation. 09/30/22   Sheikh, Kateri Mc Latif, DO  temazepam (RESTORIL) 30 MG capsule Take 1 capsule (30 mg total) by mouth at bedtime as needed. Patient taking differently: Take 30 mg by mouth at bedtime. 09/01/22   Tomma Lightning, MD      Allergies    Hydroxyzine and Ambien [zolpidem tartrate]    Review of Systems   Review of Systems  Gastrointestinal:  Positive for abdominal pain and  nausea.  All other systems reviewed and are negative.   Physical Exam Updated Vital Signs BP 115/60   Pulse 69   Temp 98.8 F (37.1 C)   Resp 18   Ht 5\' 3"  (1.6 m)   Wt 82 kg   SpO2 97%   BMI 32.02 kg/m  Physical Exam Vitals and nursing note reviewed.  Constitutional:      General: She is not in acute distress.    Appearance: She is well-developed. She is ill-appearing.  HENT:     Head: Normocephalic and atraumatic.  Eyes:     Conjunctiva/sclera: Conjunctivae normal.   Cardiovascular:     Rate and Rhythm: Normal rate and regular rhythm.  Pulmonary:     Effort: Pulmonary effort is normal. No respiratory distress.     Breath sounds: Normal breath sounds.  Abdominal:     Palpations: Abdomen is soft.     Tenderness: There is abdominal tenderness in the left upper quadrant and left lower quadrant. There is guarding.  Musculoskeletal:        General: No swelling.     Cervical back: Neck supple.  Skin:    General: Skin is warm and dry.     Capillary Refill: Capillary refill takes less than 2 seconds.  Neurological:     Mental Status: She is alert.  Psychiatric:        Mood and Affect: Mood normal.     ED Results / Procedures / Treatments   Labs (all labs ordered are listed, but only abnormal results are displayed) Labs Reviewed  CBC WITH DIFFERENTIAL/PLATELET - Abnormal; Notable for the following components:      Result Value   WBC 12.2 (*)    Hemoglobin 11.1 (*)    HCT 35.2 (*)    RDW 18.3 (*)    Platelets 661 (*)    Neutro Abs 8.2 (*)    All other components within normal limits  COMPREHENSIVE METABOLIC PANEL - Abnormal; Notable for the following components:   CO2 20 (*)    Creatinine, Ser 1.07 (*)    All other components within normal limits  URINALYSIS, ROUTINE W REFLEX MICROSCOPIC - Abnormal; Notable for the following components:   Ketones, ur 20 (*)    Protein, ur 30 (*)    Bacteria, UA RARE (*)    All other components within normal limits  LIPASE, BLOOD    EKG None  Radiology CT ABDOMEN PELVIS W CONTRAST  Result Date: 10/21/2022 CLINICAL DATA:  Severe abdominal pain and nausea. EXAM: CT ABDOMEN AND PELVIS WITH CONTRAST TECHNIQUE: Multidetector CT imaging of the abdomen and pelvis was performed using the standard protocol following bolus administration of intravenous contrast. RADIATION DOSE REDUCTION: This exam was performed according to the departmental dose-optimization program which includes automated exposure control,  adjustment of the mA and/or kV according to patient size and/or use of iterative reconstruction technique. CONTRAST:  80mL OMNIPAQUE IOHEXOL 300 MG/ML  SOLN COMPARISON:  CT 10/20/2022, 10/17/2022, 09/28/2022 FINDINGS: Lower chest: There is some mild peripheral patchy ground-glass opacities along the lung bases. This has seen previously. No pleural effusion. The heart is enlarged. Small pericardial effusion. Hepatobiliary: Previous cholecystectomy. Mild fatty liver infiltration. Slightly ectatic common bile duct but the duct does taper towards the pancreatic head and has a relatively normal configuration for the post cholecystectomy state. Patent portal vein. Tiny segment 2 hepatic cyst. No specific imaging follow-up. Pancreas: Unremarkable. No pancreatic ductal dilatation or surrounding inflammatory changes. Spleen: Normal in size without focal  abnormality. Adrenals/Urinary Tract: Right adrenal gland is preserved. There is left adrenal nodule once again measuring 19 x 13 mm and has been stable since at least May 2018. No further imaging follow up with long-term stability. Once again there are multiple bilateral Bosniak 1 renal cysts as described previously. No additional imaging follow-up. There are some areas of atrophy along the kidneys bilaterally as well. No clearly enhancing renal mass or collecting system dilatation. The ureters have normal course and caliber extending down to the bladder. Bladder wall is slightly thickened with a tiny left-sided bladder diverticulum. Stomach/Bowel: The stomach and small bowel are nondilated. Large bowel has a normal course and caliber left-sided colonic diverticula. Normal appendix in the central pelvis. Of note the cecum resides in the central pelvis above the bladder. Vascular/Lymphatic: Aortic atherosclerosis. No enlarged abdominal or pelvic lymph nodes. Reproductive: Status post hysterectomy. No adnexal masses. Other: No free air or free fluid seen today. Musculoskeletal:  Scattered degenerative changes of the spine and pelvis. Disc bulging seen at L4-5 and L5-S1. IMPRESSION: No bowel obstruction, free air or free fluid. Normal appendix. Colonic diverticula. Previous inflammatory changes along the distal descending colon are not clearly seen on the current examination. Previous cholecystectomy. Fatty liver infiltration. Electronically Signed   By: Karen Kays M.D.   On: 10/21/2022 10:11   CT ABDOMEN PELVIS W CONTRAST  Result Date: 10/20/2022 CLINICAL DATA:  Worsening abdominal pain, diverticulitis EXAM: CT ABDOMEN AND PELVIS WITH CONTRAST TECHNIQUE: Multidetector CT imaging of the abdomen and pelvis was performed using the standard protocol following bolus administration of intravenous contrast. RADIATION DOSE REDUCTION: This exam was performed according to the departmental dose-optimization program which includes automated exposure control, adjustment of the mA and/or kV according to patient size and/or use of iterative reconstruction technique. CONTRAST:  75mL OMNIPAQUE IOHEXOL 350 MG/ML SOLN COMPARISON:  10/17/2022 FINDINGS: Lower chest: Mild cardiomegaly. Mild dependent atelectasis in both lower lobes. Hepatobiliary: Mild intrahepatic and extrahepatic biliary dilatation with prior cholecystectomy. Common bile duct proximally 1.2 cm in diameter on image 17 series 2, stable 07/31/2016 although the intrahepatic biliary dilatation is mildly increased from 2018. This may reflect a physiologic response to cholecystectomy. Pancreas: Unremarkable Spleen: Unremarkable Adrenals/Urinary Tract: The kidneys appear unchanged with no significant findings warranting follow up. 1.9 by 1.3 cm left adrenal mass, similar to 07/31/2016 examination where this lesion was definitively characterized as adenoma. No further imaging workup of this lesion is indicated. Stomach/Bowel: Normal appendix. Descending and sigmoid colon diverticulosis, the edema and stranding at the junction of the descending and  sigmoid colon appears improved. No extraluminal gas or abscess. No small bowel dilatation. Mildly dilated duodenal bulb, probably incidental. Vascular/Lymphatic: Mild aortoiliac atherosclerotic vascular disease. Reproductive: Uterus absent, right ovary not visualized. Left ovary unremarkable. Other: Trace free pelvic fluid in the cul-de-sac, improved from previous. Musculoskeletal: Degenerative subcortical cyst formation in the anterior superior acetabulum bilaterally. Degenerative facet arthropathy bilaterally at L4-5 at L5-S1. IMPRESSION: 1. Improved appearance of diverticulitis at the junction of the descending and sigmoid colon, nearly resolved. No extraluminal gas or abscess. 2. Aortic atherosclerosis. Aortic Atherosclerosis (ICD10-I70.0). Electronically Signed   By: Gaylyn Rong M.D.   On: 10/20/2022 17:43    Procedures Procedures    Medications Ordered in ED Medications  prochlorperazine (COMPAZINE) injection 10 mg (has no administration in time range)  ondansetron (ZOFRAN) injection 4 mg (4 mg Intravenous Given 10/21/22 0920)  fentaNYL (SUBLIMAZE) injection 50 mcg (50 mcg Intravenous Given 10/21/22 0918)  lactated ringers bolus 1,000 mL (0  mLs Intravenous Stopped 10/21/22 1206)  iohexol (OMNIPAQUE) 300 MG/ML solution 80 mL (80 mLs Intravenous Contrast Given 10/21/22 0928)  piperacillin-tazobactam (ZOSYN) IVPB 3.375 g (0 g Intravenous Stopped 10/21/22 1118)  HYDROcodone-acetaminophen (NORCO/VICODIN) 5-325 MG per tablet 1 tablet (1 tablet Oral Given 10/21/22 1203)    ED Course/ Medical Decision Making/ A&P                                 Medical Decision Making Amount and/or Complexity of Data Reviewed Labs: ordered. Radiology: ordered.  Risk Prescription drug management.    57 year old female with medical history significant for HTN, anemia, GERD, bleeding gastric ulcer, diverticulitis with recent diagnosis of diverticulitis treated with Augmentin who presents to the emergency  department with severe abdominal pain and nausea.  On chart review, the patient was discharged from the hospital on 10/18/2022 after a 1 day admission with treatment for diverticulitis.  She was initially started on Rocephin and Flagyl and transition to Augmentin.  She was discharged home with a 7-day course of Augmentin and PCP follow-up.  She states that today she developed severe abdominal pain, located on the left side of the abdomen with associated nausea.  She ran out of her oxycodone at home yesterday. She endorses chills.   On arrival, the patient was vitally stable, afebrile, not tachycardic, Mildly tachypneic, saturating well on room.   {Document critical care time when appropriate:1} {Document review of labs and clinical decision tools ie heart score, Chads2Vasc2 etc:1}  {Document your independent review of radiology images, and any outside records:1} {Document your discussion with family members, caretakers, and with consultants:1} {Document social determinants of health affecting pt's care:1} {Document your decision making why or why not admission, treatments were needed:1} Final Clinical Impression(s) / ED Diagnoses Final diagnoses:  Generalized abdominal pain    Rx / DC Orders ED Discharge Orders          Ordered    HYDROcodone-acetaminophen (NORCO/VICODIN) 5-325 MG tablet  Every 6 hours PRN        10/21/22 1229

## 2022-10-21 NOTE — ED Notes (Signed)
Pt ambulatory to bathroom w/o assist. Steady gait 

## 2022-10-21 NOTE — ED Triage Notes (Signed)
Per EMS, pt coming in complaining of severe abd pain, and nausea. Seen recently for same complaint. Pt has run out of oxycodone at home yesterday. Pt states she does not have any follow up with gastro at this time.

## 2022-10-21 NOTE — Discharge Instructions (Addendum)
Your CT imaging showed resolution of your previously noted diverticulitis.  Continue pain control and antibiotics at home.  Return for any severe worsening symptoms.  Your CT Imaging: IMPRESSION:  No bowel obstruction, free air or free fluid. Normal appendix.  Colonic diverticula. Previous inflammatory changes along the distal  descending colon are not clearly seen on the current examination.    Previous cholecystectomy.    Fatty liver infiltration.

## 2022-10-23 LAB — STOOL CULTURE: E coli, Shiga toxin Assay: NEGATIVE

## 2022-10-23 LAB — STOOL CULTURE REFLEX - CMPCXR

## 2022-10-23 LAB — STOOL CULTURE REFLEX - RSASHR

## 2022-10-24 ENCOUNTER — Emergency Department (HOSPITAL_COMMUNITY): Payer: 59

## 2022-10-24 ENCOUNTER — Other Ambulatory Visit: Payer: Self-pay

## 2022-10-24 ENCOUNTER — Emergency Department (HOSPITAL_COMMUNITY)
Admission: EM | Admit: 2022-10-24 | Discharge: 2022-10-24 | Disposition: A | Discharge status: Home / Self Care | Payer: 59 | Attending: Emergency Medicine | Admitting: Emergency Medicine

## 2022-10-24 DIAGNOSIS — R1084 Generalized abdominal pain: Secondary | ICD-10-CM | POA: Diagnosis not present

## 2022-10-24 DIAGNOSIS — R109 Unspecified abdominal pain: Secondary | ICD-10-CM | POA: Diagnosis not present

## 2022-10-24 DIAGNOSIS — E860 Dehydration: Secondary | ICD-10-CM

## 2022-10-24 DIAGNOSIS — E876 Hypokalemia: Secondary | ICD-10-CM | POA: Diagnosis not present

## 2022-10-24 DIAGNOSIS — R112 Nausea with vomiting, unspecified: Secondary | ICD-10-CM | POA: Diagnosis not present

## 2022-10-24 DIAGNOSIS — I10 Essential (primary) hypertension: Secondary | ICD-10-CM | POA: Diagnosis not present

## 2022-10-24 LAB — COMPREHENSIVE METABOLIC PANEL
ALT: 17 U/L (ref 0–44)
AST: 14 U/L — ABNORMAL LOW (ref 15–41)
Albumin: 4.1 g/dL (ref 3.5–5.0)
Alkaline Phosphatase: 55 U/L (ref 38–126)
Anion gap: 9 (ref 5–15)
BUN: 12 mg/dL (ref 6–20)
CO2: 16 mmol/L — ABNORMAL LOW (ref 22–32)
Calcium: 9.3 mg/dL (ref 8.9–10.3)
Chloride: 115 mmol/L — ABNORMAL HIGH (ref 98–111)
Creatinine, Ser: 1.17 mg/dL — ABNORMAL HIGH (ref 0.44–1.00)
GFR, Estimated: 54 mL/min — ABNORMAL LOW (ref >60)
Glucose, Bld: 83 mg/dL (ref 70–99)
Potassium: 4.1 mmol/L (ref 3.5–5.1)
Sodium: 140 mmol/L (ref 135–145)
Total Bilirubin: 0.5 mg/dL (ref 0.3–1.2)
Total Protein: 7.9 g/dL (ref 6.5–8.1)

## 2022-10-24 LAB — URINALYSIS, ROUTINE W REFLEX MICROSCOPIC
Bilirubin Urine: NEGATIVE
Glucose, UA: NEGATIVE mg/dL
Hgb urine dipstick: NEGATIVE
Ketones, ur: 5 mg/dL — AB
Nitrite: NEGATIVE
Protein, ur: 100 mg/dL — AB
Specific Gravity, Urine: 1.034 — ABNORMAL HIGH (ref 1.005–1.030)
pH: 5 (ref 5.0–8.0)

## 2022-10-24 LAB — BASIC METABOLIC PANEL
Anion gap: 12 (ref 5–15)
BUN: 10 mg/dL (ref 6–20)
CO2: 15 mmol/L — ABNORMAL LOW (ref 22–32)
Calcium: 9.2 mg/dL (ref 8.9–10.3)
Chloride: 112 mmol/L — ABNORMAL HIGH (ref 98–111)
Creatinine, Ser: 1.01 mg/dL — ABNORMAL HIGH (ref 0.44–1.00)
GFR, Estimated: >60 mL/min (ref >60)
Glucose, Bld: 71 mg/dL (ref 70–99)
Potassium: 3.4 mmol/L — ABNORMAL LOW (ref 3.5–5.1)
Sodium: 139 mmol/L (ref 135–145)

## 2022-10-24 LAB — BLOOD GAS, VENOUS
Acid-base deficit: 7.7 mmol/L — ABNORMAL HIGH (ref 0.0–2.0)
Bicarbonate: 18.3 mmol/L — ABNORMAL LOW (ref 20.0–28.0)
O2 Saturation: 93.2 %
Patient temperature: 37
pCO2, Ven: 38 mmHg — ABNORMAL LOW (ref 44–60)
pH, Ven: 7.29 (ref 7.25–7.43)
pO2, Ven: 65 mmHg — ABNORMAL HIGH (ref 32–45)

## 2022-10-24 LAB — CBC
HCT: 39.4 % (ref 36.0–46.0)
Hemoglobin: 12.3 g/dL (ref 12.0–15.0)
MCH: 26.9 pg (ref 26.0–34.0)
MCHC: 31.2 g/dL (ref 30.0–36.0)
MCV: 86.2 fL (ref 80.0–100.0)
Platelets: 766 10*3/uL — ABNORMAL HIGH (ref 150–400)
RBC: 4.57 MIL/uL (ref 3.87–5.11)
RDW: 19 % — ABNORMAL HIGH (ref 11.5–15.5)
WBC: 13.1 10*3/uL — ABNORMAL HIGH (ref 4.0–10.5)
nRBC: 0 % (ref 0.0–0.2)

## 2022-10-24 LAB — LIPASE, BLOOD: Lipase: 19 U/L (ref 11–51)

## 2022-10-24 MED ORDER — METOCLOPRAMIDE HCL 10 MG PO TABS: 10.0000 mg | ORAL_TABLET | Freq: Four times a day (QID) | ORAL | 0 refills | Status: DC

## 2022-10-24 MED ORDER — MORPHINE SULFATE (PF) 4 MG/ML IV SOLN
6.0000 mg | Freq: Once | INTRAVENOUS | Status: AC
  Administered 2022-10-24: 6 mg via INTRAVENOUS
  Filled 2022-10-24: qty 2

## 2022-10-24 MED ORDER — HYDROCODONE-ACETAMINOPHEN 5-325 MG PO TABS
1.0000 | ORAL_TABLET | Freq: Once | ORAL | Status: AC
  Administered 2022-10-24: 1 via ORAL
  Filled 2022-10-24: qty 1

## 2022-10-24 MED ORDER — ALUM & MAG HYDROXIDE-SIMETH 200-200-20 MG/5ML PO SUSP
30.0000 mL | Freq: Once | ORAL | Status: AC
  Administered 2022-10-24: 30 mL via ORAL
  Filled 2022-10-24: qty 30

## 2022-10-24 MED ORDER — DICYCLOMINE HCL 20 MG PO TABS: 20.0000 mg | ORAL_TABLET | Freq: Two times a day (BID) | ORAL | 0 refills | Status: DC

## 2022-10-24 MED ORDER — LACTATED RINGERS IV BOLUS
1000.0000 mL | Freq: Once | INTRAVENOUS | Status: AC
  Administered 2022-10-24: 1000 mL via INTRAVENOUS

## 2022-10-24 MED ORDER — METOCLOPRAMIDE HCL 5 MG/ML IJ SOLN
10.0000 mg | Freq: Once | INTRAMUSCULAR | Status: AC
  Administered 2022-10-24: 10 mg via INTRAVENOUS
  Filled 2022-10-24: qty 2

## 2022-10-24 MED ORDER — PANTOPRAZOLE SODIUM 40 MG IV SOLR
40.0000 mg | Freq: Once | INTRAVENOUS | Status: AC
  Administered 2022-10-24: 40 mg via INTRAVENOUS
  Filled 2022-10-24: qty 10

## 2022-10-24 MED ORDER — HALOPERIDOL LACTATE 5 MG/ML IJ SOLN
5.0000 mg | Freq: Once | INTRAMUSCULAR | Status: AC
  Administered 2022-10-24: 5 mg via INTRAVENOUS
  Filled 2022-10-24: qty 1

## 2022-10-24 NOTE — ED Triage Notes (Signed)
Pt BIB EMS c/o n/v/d and abd pain x 1 week. Recent diverticulitis dx, finished antibiotics with no relief.

## 2022-10-24 NOTE — Discharge Instructions (Addendum)
Thank you for coming to [facility] Emergency Department. You were seen for abdominal pain, nausea/vomiting/diarrhea. We did an exam, labs, and imaging, and these showed likely dehydration or diarrhea We have prescribed reglan 10 mg every 6-8 hours as needed for nausea/vomiting and bentyl 20 mg twice per day for abdominal cramping. You can take imodium over the counter for 2-3 days for diarrhea. It is very important that you stay well hydrated. Please follow up with your primary care provider within 1 week for lab recheck and reevaluation. Please follow up with your GI doctor as soon as possible.  Do not hesitate to return to the ED or call 911 if you experience: -Worsening symptoms -Inability to eat/drink -Lightheadedness, passing out -Fevers/chills -Anything else that concerns you

## 2022-10-24 NOTE — ED Provider Notes (Addendum)
6:43 PM Assumed care of patient from off-going team. For more details, please see note from same day.  In brief, this is a 57 y.o. female w/ h/o gastritis and diverticulitis s/p antibiotics but no relief of sxs. P/w N/V/D, abd pain. Had bicarb 16. Got fluid bolus. Had CT abd scans on 7/14, 8/2, 8/5, 8/6. Did not get imaging today d/t recent many scans.   Plan/Dispo at time of sign-out & ED Course since sign-out: Repeat BMP after fluids, PO challenge  BP 124/73 (BP Location: Left Arm)   Pulse 70   Temp 98 F (36.7 C)   Resp 18   Ht 5\' 3"  (1.6 m)   Wt 82 kg   SpO2 98%   BMI 32.02 kg/m    ED Course:   Clinical Course as of 10/24/22 1843  Fri Oct 24, 2022  1453 CO2(!): 16 Patient's bicarb is low, likely because of the diarrhea and vomiting she is having.  Sodium is normal. [AN]  1454 WBC(!): 13.1 White count is at 13.1, similar to the white count she has had in the recent visits. [AN]  1454 Urinalysis, Routine w reflex microscopic -Urine, Clean Catch(!) Slight ketones in the urine, consistent with dehydration. [AN]  1839 BMP with mildly decreased potassium, decreased creatinine, CO2 essentially unchanged.  D/w patient about her dehydration and that she needs to make sure she eats and drinks.. She states she is ready to be discharged. She passed a PO challenge but states she still feels the same as when she arrived, same as she has felt for weeks to months. She follows with a new GI at [facility] but stated that she can get an appointment until October.  I encouraged her to call again to see if there is an earlier one or if she can get on a cancellation list.  I also encouraged her to follow-up with her PCP sometime this week in order to have labs rechecked and be reevaluated.  She states she does not have anything at home for nausea and Zofran did not work, will prescribe Reglan.  She also has never tried Bentyl, will trial a course of Bentyl Rx.  Gave patient discharge instructions and  return precautions. [HN]    Clinical Course User Index [AN] Derwood Kaplan, MD [HN] Loetta Rough, MD    Dispo: DC w/ discharge instructions/return precautions. All questions answered to patient's satisfaction.   ------------------------------- Vivi Barrack, MD Emergency Medicine    Loetta Rough, MD 10/24/22 [phone]

## 2022-10-24 NOTE — ED Provider Notes (Signed)
[facility] AT [facility] Provider Note   CSN: [id number] Arrival date & time: 10/24/22  1204     History {Add pertinent medical, surgical, social history, OB history to HPI:1} Chief Complaint  Patient presents with   Emesis    Natalie Edwards is a 57 y.o. female.  HPI     Home Medications Prior to Admission medications   Medication Sig Start Date End Date Taking? Authorizing Provider  acetaminophen (TYLENOL) 325 MG tablet Take 2 tablets (650 mg total) by mouth every 6 (six) hours as needed for mild pain (or Fever >/= 101). Patient not taking: Reported on 10/18/2022 09/30/22   Marguerita Merles Latif, DO  amLODipine-benazepril (LOTREL) 10-40 MG capsule TAKE 1 CAPSULE BY MOUTH DAILY 10/16/22 04/14/23  [name], MD  amoxicillin-clavulanate (AUGMENTIN) 875-125 MG tablet Take 1 tablet by mouth 2 (two) times daily for 6 days. 10/18/22 10/24/22  Danford, Earl Lites, MD  BELSOMRA 20 MG TABS Take 1 tablet (20 mg total) by mouth at bedtime. 09/01/22   Olalere, Onnie Boer A, MD  diphenhydramine-acetaminophen (TYLENOL PM) 25-500 MG TABS tablet Take 1 tablet by mouth at bedtime as needed (pain).    [provider]  FLUoxetine (PROZAC) 20 MG capsule Take 20 mg by mouth daily. 10/02/22   [provider]  HYDROcodone-acetaminophen (NORCO/VICODIN) 5-325 MG tablet Take 1-2 tablets by mouth every 6 (six) hours as needed. 10/21/22   Ernie Avena, MD  loratadine (CLARITIN) 10 MG tablet Take 1 tablet (10 mg total) by mouth daily as needed for allergies. Patient not taking: Reported on 10/18/2022 09/24/22   Bing Neighbors, NP  metoCLOPramide (REGLAN) 10 MG tablet Take 1 tablet (10 mg total) by mouth every 6 (six) hours. Patient not taking: Reported on 10/18/2022 09/24/22   Bing Neighbors, NP  mirtazapine (REMERON) 45 MG tablet Take 1 tablet (45 mg total) by mouth at bedtime. 09/01/22   [name], MD  ondansetron (ZOFRAN) 4 MG tablet Take 1 tablet (4 mg total)  by mouth every 6 (six) hours as needed for nausea. 10/18/22   Danford, Earl Lites, MD  pantoprazole (PROTONIX) 40 MG tablet Take 1 tablet (40 mg total) by mouth 2 (two) times daily. 09/30/22 10/30/22  Marguerita Merles Latif, DO  polyethylene glycol (MIRALAX / GLYCOLAX) 17 g packet Take 17 g by mouth daily as needed for mild constipation. 09/30/22   Sheikh, Kateri Mc Latif, DO  temazepam (RESTORIL) 30 MG capsule Take 1 capsule (30 mg total) by mouth at bedtime as needed. Patient taking differently: Take 30 mg by mouth at bedtime. 09/01/22   Tomma Lightning, MD      Allergies    Hydroxyzine and Ambien [zolpidem tartrate]    Review of Systems   Review of Systems  Physical Exam Updated Vital Signs BP 112/60 (BP Location: Right Arm)   Pulse 65   Temp 97.9 F (36.6 C) (Oral)   Resp 18   Ht 5\' 3"  (1.6 m)   Wt 82 kg   SpO2 97%   BMI 32.02 kg/m  Physical Exam  ED Results / Procedures / Treatments   Labs (all labs ordered are listed, but only abnormal results are displayed) Labs Reviewed  COMPREHENSIVE METABOLIC PANEL - Abnormal; Notable for the following components:      Result Value   Chloride 115 (*)    CO2 16 (*)    Creatinine, Ser 1.17 (*)    AST 14 (*)    GFR, Estimated 54 (*)  All other components within normal limits  URINALYSIS, ROUTINE W REFLEX MICROSCOPIC - Abnormal; Notable for the following components:   APPearance HAZY (*)    Specific Gravity, Urine 1.034 (*)    Ketones, ur 5 (*)    Protein, ur 100 (*)    Leukocytes,Ua TRACE (*)    Bacteria, UA RARE (*)    All other components within normal limits  CBC - Abnormal; Notable for the following components:   WBC 13.1 (*)    RDW 19.0 (*)    Platelets 766 (*)    All other components within normal limits  C DIFFICILE QUICK SCREEN W PCR REFLEX    LIPASE, BLOOD  BLOOD GAS, VENOUS  BASIC METABOLIC PANEL    EKG None  Radiology DG Abdomen Acute W/Chest  Result Date: 10/24/2022 CLINICAL DATA:  57 year old female with  possible free air. Abdominal pain. EXAM: DG ABDOMEN ACUTE WITH 1 VIEW CHEST COMPARISON:  CT 10/21/2022 FINDINGS: Chest: Cardiomediastinal silhouette unchanged in size and contour. No evidence of central vascular congestion. No interlobular septal thickening. No pneumothorax or pleural effusion. Coarsened interstitial markings, with no confluent airspace disease. Abdomen: Gas within stomach small bowel and colon. No abnormal distension. No air-fluid levels. No significant stool burden. Cholecystectomy. No displaced fracture. IMPRESSION: Chest: Negative for acute cardiopulmonary disease. Abdomen: Nonobstructive bowel gas pattern. Conventional plain film is negative for free air Electronically Signed   By: Gilmer Mor D.O.   On: 10/24/2022 15:26    Procedures Procedures  {Document cardiac monitor, telemetry assessment procedure when appropriate:1}  Medications Ordered in ED Medications  morphine (PF) 4 MG/ML injection 6 mg (6 mg Intravenous Given 10/24/22 1405)  haloperidol lactate (HALDOL) injection 5 mg (5 mg Intravenous Given 10/24/22 1405)  metoCLOPramide (REGLAN) injection 10 mg (10 mg Intravenous Given 10/24/22 1404)  pantoprazole (PROTONIX) injection 40 mg (40 mg Intravenous Given 10/24/22 1404)  alum & mag hydroxide-simeth (MAALOX/MYLANTA) 200-200-20 MG/5ML suspension 30 mL (30 mLs Oral Given 10/24/22 1403)  lactated ringers bolus 1,000 mL (1,000 mLs Intravenous New Bag/Given 10/24/22 1456)  HYDROcodone-acetaminophen (NORCO/VICODIN) 5-325 MG per tablet 1 tablet (1 tablet Oral Given 10/24/22 1531)    ED Course/ Medical Decision Making/ A&P Clinical Course as of 10/24/22 1601  Fri Oct 24, 2022  1453 CO2(!): 16 Patient's bicarb is low, likely because of the diarrhea and vomiting she is having.  Sodium is normal. [AN]  1454 WBC(!): 13.1 White count is at 13.1, similar to the white count she has had in the recent visits. [AN]  1454 Urinalysis, Routine w reflex microscopic -Urine, Clean Catch(!) Slight  ketones in the urine, consistent with dehydration. [AN]    Clinical Course User Index [AN] Derwood Kaplan, MD   {   Click here for ABCD2, HEART and other calculatorsREFRESH Note before signing :1}                              Medical Decision Making Amount and/or Complexity of Data Reviewed Labs: ordered. Decision-making details documented in ED Course. Radiology: ordered.  Risk OTC drugs. Prescription drug management.   Patient comes in with chief complaint of abdominal pain, nausea, vomiting, diarrhea.  Patient recently diagnosed with diverticulitis and gastritis.  On exam, she has diffuse abdominal tenderness with some guarding.  However patient has had 3 CT scans in the last week, and they have evolved into showing improvement in diverticulitis.  Differential diagnosis for her includes persistent diverticulitis, gastritis, gastroparesis, C.  difficile colitis, severe electrolyte abnormality.  At this time, the plan is to get basic labs and reassess the patient.  P.o. challenge also initiated.  X-ray ordered to ensure there is no free air.   4:02 PM I have independently interpreted patient's x-ray of the chest, there is no evidence of free air. Final Clinical Impression(s) / ED Diagnoses Final diagnoses:  None    Rx / DC Orders ED Discharge Orders     None

## 2022-10-28 ENCOUNTER — Ambulatory Visit (INDEPENDENT_AMBULATORY_CARE_PROVIDER_SITE_OTHER): Payer: 59 | Admitting: Family Medicine

## 2022-10-28 ENCOUNTER — Telehealth: Payer: Self-pay | Admitting: *Deleted

## 2022-10-28 ENCOUNTER — Encounter: Payer: Self-pay | Admitting: Family Medicine

## 2022-10-28 VITALS — BP 117/75 | HR 93 | Temp 98.1°F | Resp 16 | Ht 64.0 in | Wt 175.6 lb

## 2022-10-28 DIAGNOSIS — F32A Depression, unspecified: Secondary | ICD-10-CM

## 2022-10-28 DIAGNOSIS — E860 Dehydration: Secondary | ICD-10-CM | POA: Diagnosis not present

## 2022-10-28 DIAGNOSIS — Z8719 Personal history of other diseases of the digestive system: Secondary | ICD-10-CM | POA: Diagnosis not present

## 2022-10-28 DIAGNOSIS — F419 Anxiety disorder, unspecified: Secondary | ICD-10-CM

## 2022-10-28 NOTE — Progress Notes (Unsigned)
Patient is here for 11 month follow-up with provider for Anxiety and Depression . Patient said she feels better

## 2022-10-28 NOTE — Telephone Encounter (Signed)
Transition Care Management Unsuccessful Follow-up Telephone Call  Date of discharge and from where:  [facility] : [facility]  10/21/2022  Attempts:  1st Attempt  Reason for unsuccessful TCM follow-up call:  No answer/busy

## 2022-10-28 NOTE — Progress Notes (Unsigned)
New Patient Office Visit  Subjective    Patient ID: Natalie Edwards, female    DOB: 1966-01-25  Age: 57 y.o. MRN: [id number]  CC: No chief complaint on file.   HPI Natalie Edwards presents fo follow up of recent ED visit for diverticulitis and dehydration. Patient reports improvement. Patient denies acute complaints.    Outpatient Encounter Medications as of 10/28/2022  Medication Sig   acetaminophen (TYLENOL) 325 MG tablet Take 2 tablets (650 mg total) by mouth every 6 (six) hours as needed for mild pain (or Fever >/= 101).   amLODipine-benazepril (LOTREL) 10-40 MG capsule TAKE 1 CAPSULE BY MOUTH DAILY   BELSOMRA 20 MG TABS Take 1 tablet (20 mg total) by mouth at bedtime.   dicyclomine (BENTYL) 20 MG tablet Take 1 tablet (20 mg total) by mouth 2 (two) times daily.   diphenhydramine-acetaminophen (TYLENOL PM) 25-500 MG TABS tablet Take 1 tablet by mouth at bedtime as needed (pain).   FLUoxetine (PROZAC) 20 MG capsule Take 20 mg by mouth daily.   HYDROcodone-acetaminophen (NORCO/VICODIN) 5-325 MG tablet Take 1-2 tablets by mouth every 6 (six) hours as needed.   loratadine (CLARITIN) 10 MG tablet Take 1 tablet (10 mg total) by mouth daily as needed for allergies.   mirtazapine (REMERON) 45 MG tablet Take 1 tablet (45 mg total) by mouth at bedtime.   ondansetron (ZOFRAN) 4 MG tablet Take 1 tablet (4 mg total) by mouth every 6 (six) hours as needed for nausea.   pantoprazole (PROTONIX) 40 MG tablet Take 1 tablet (40 mg total) by mouth 2 (two) times daily.   polyethylene glycol (MIRALAX / GLYCOLAX) 17 g packet Take 17 g by mouth daily as needed for mild constipation.   pregabalin (LYRICA) 75 MG capsule Take 75 mg by mouth 2 (two) times daily.   temazepam (RESTORIL) 30 MG capsule Take 1 capsule (30 mg total) by mouth at bedtime as needed. (Patient taking differently: Take 30 mg by mouth at bedtime.)   metoCLOPramide (REGLAN) 10 MG tablet Take 1 tablet (10 mg total) by mouth every 6 (six) hours.  (Patient not taking: Reported on 10/28/2022)   No facility-administered encounter medications on file as of 10/28/2022.    Past Medical History:  Diagnosis Date   Allergy    Anemia    Diverticulosis    Gall bladder stones 1993   Gastric ulcer    GERD (gastroesophageal reflux disease)    Hypertension    Renal mass     Past Surgical History:  Procedure Laterality Date   ABDOMINAL HYSTERECTOMY     BIOPSY  09/29/2022   Procedure: BIOPSY;  Surgeon: Napoleon Form, MD;  Location: [facility] ENDOSCOPY;  Service: Gastroenterology;;   CHOLECYSTECTOMY     ESOPHAGOGASTRODUODENOSCOPY (EGD) WITH PROPOFOL N/A 09/29/2022   Procedure: ESOPHAGOGASTRODUODENOSCOPY (EGD) WITH PROPOFOL;  Surgeon: Napoleon Form, MD;  Location: [facility] ENDOSCOPY;  Service: Gastroenterology;  Laterality: N/A;    Family History  Problem Relation Age of Onset   Cancer Maternal Uncle        Lung   Cancer Maternal Uncle        Lung   Cancer Maternal Uncle        Lung   Headache Neg Hx        "I don't think so"   Migraines Neg Hx        "I don't think so"    Social History   Socioeconomic History   Marital status: Single    Spouse name:  Not on file   Number of children: 3   Years of education: Not on file   Highest education level: High school graduate  Occupational History   Occupation: environmental services at [employer]  Tobacco Use   Smoking status: Every Day    Current packs/day: 0.50    Average packs/day: 0.5 packs/day for 25.0 years (12.5 ttl pk-yrs)    Types: Cigarettes   Smokeless tobacco: Never   Tobacco comments:    Pt tried to quit - helps with anxiety     1 pack last patient 3 days   Vaping Use   Vaping status: Never Used  Substance and Sexual Activity   Alcohol use: Yes    Comment: occasionally - last drink was July 4, 1 shot   Drug use: No   Sexual activity: Yes    Comment: hysterectomy  Other Topics Concern   Not on file  Social History Narrative   Lives at home in an apartment.  Her mother lives with her.    Right handed   Caffeine: drinks approx. 36 oz of pepsi per day. Sometimes drinks 2 cups of coffee in a day as well.    Social Determinants of Health   Financial Resource Strain: High Risk (05/22/2022)   Overall Financial Resource Strain (CARDIA)    Difficulty of Paying Living Expenses: Very hard  Food Insecurity: No Food Insecurity (10/01/2022)   Hunger Vital Sign    Worried About Running Out of Food in the Last Year: Never true    Ran Out of Food in the Last Year: Never true  Transportation Needs: No Transportation Needs (10/01/2022)   PRAPARE - Administrator, Civil Service (Medical): No    Lack of Transportation (Non-Medical): No  Physical Activity: Sufficiently Active (05/22/2022)   Exercise Vital Sign    Days of Exercise per Week: 4 days    Minutes of Exercise per Session: 70 min  Stress: Stress Concern Present (05/22/2022)   [employer] of Occupational Health - Occupational Stress Questionnaire    Feeling of Stress : Very much  Social Connections: Moderately Isolated (05/22/2022)   Social Connection and Isolation Panel [NHANES]    Frequency of Communication with Friends and Family: More than three times a week    Frequency of Social Gatherings with Friends and Family: Never    Attends Religious Services: Never    Database administrator or Organizations: Yes    Attends Banker Meetings: Never    Marital Status: Never married  Intimate Partner Violence: Not At Risk (09/28/2022)   Humiliation, Afraid, Rape, and Kick questionnaire    Fear of Current or Ex-Partner: No    Emotionally Abused: No    Physically Abused: No    Sexually Abused: No    Review of Systems  All other systems reviewed and are negative.       Objective    BP 117/75   Pulse 93   Temp 98.1 F (36.7 C) (Oral)   Resp 16   Ht 5\' 4"  (1.626 m)   Wt 175 lb 9.6 oz (79.7 kg)   SpO2 95%   BMI 30.14 kg/m   Physical Exam Vitals and nursing note  reviewed.  Constitutional:      General: She is not in acute distress. Cardiovascular:     Rate and Rhythm: Normal rate and regular rhythm.  Pulmonary:     Effort: Pulmonary effort is normal.     Breath sounds: Normal breath sounds.  Abdominal:     Palpations: Abdomen is soft.     Tenderness: There is no abdominal tenderness.  Neurological:     General: No focal deficit present.     Mental Status: She is alert and oriented to person, place, and time.         Assessment & Plan:   1. Dehydration Improved/resolved. Discussed adequate hydration on a daily basis.   2. Anxiety and depression Appears stable. Continue   3. History of diverticulitis Improved. Dietary options discussed.   Return in about 3 months (around 01/28/2023) for follow up.   Tommie Raymond, MD

## 2022-10-29 ENCOUNTER — Telehealth: Payer: Self-pay | Admitting: *Deleted

## 2022-10-29 NOTE — Telephone Encounter (Signed)
Transition Care Management Follow-up Telephone Call Date of discharge and from where: [facility] 10/21/2022 How have you been since you were released from the hospital? Feeling much better  Any questions or concerns? No  Items Reviewed: Did the pt receive and understand the discharge instructions provided? Yes  Medications obtained and verified? Yes  Other? No  Any new allergies since your discharge? No  Dietary orders reviewed? Yes Do you have support at home? Yes    Follow up appointments reviewed:  PCP Hospital f/u appt confirmed? Yes  saw pcp yesterday  Are transportation arrangements needed? No  If their condition worsens, is the pt aware to call PCP or go to the Emergency Dept.? Yes Was the patient provided with contact information for the PCP's office or ED? Yes Was to pt encouraged to call back with questions or concerns? Yes

## 2022-10-30 ENCOUNTER — Telehealth: Payer: Self-pay

## 2022-10-30 ENCOUNTER — Encounter: Payer: Self-pay | Admitting: Family Medicine

## 2022-10-30 NOTE — Telephone Encounter (Signed)
Transition Care Management Unsuccessful Follow-up Telephone Call  Date of discharge and from where:  10/24/2022 [facility]  Attempts:  1st Attempt  Reason for unsuccessful TCM follow-up call:  Unable to leave message Voicemail not set-up.   [name]  [facility] Population Health Community Resource Care Guide   [email or web address]  ?? [phone]   Website: [email or web address]

## 2022-10-31 ENCOUNTER — Telehealth: Payer: Self-pay

## 2022-10-31 ENCOUNTER — Ambulatory Visit: Payer: Self-pay | Admitting: *Deleted

## 2022-10-31 DIAGNOSIS — I10 Essential (primary) hypertension: Secondary | ICD-10-CM

## 2022-10-31 NOTE — Addendum Note (Signed)
Addended by: Buck Mam on: 10/31/2022 02:41 PM   Modules accepted: Orders

## 2022-10-31 NOTE — Telephone Encounter (Signed)
Transition Care Management Follow-up Telephone Call Date of discharge and from where: 10/24/2022 [name] How have you been since you were released from the hospital? Patient was on another called and stated she will call me back.  Natalie Edwards [name]  [name] Population Health Community Resource Care Guide   [email or web address]  ?? [phone]   Website: [email or web address]  [email or web address]

## 2022-10-31 NOTE — Patient Outreach (Signed)
  Care Coordination   Follow Up Visit Note   10/31/2022 Name: Phoebee Autrey MRN: [id number] DOB: [birth year]  Trayana Boley is a 57 y.o. year old female who sees [name], MD for primary care. I spoke with  [name] by phone today.  What matters to the patients health and wellness today?  Been in and out of hospital with ulcers and diverticulitis.   Goals Addressed             This Visit's Progress    Reduce stress related to financial strains        Activities and task to complete in order to accomplish goals.   Call/apply for SS Disability as you indicate is your plan now Follow up with housing resources discussed (sent you an email for resource/support to prevent your eviction) Call [facility] [phone] to schedule your counseling appointment/paperwork completed. Follow up on medical appointments: Hematology, GI, etc Expect call for housing and financial support resources         SDOH assessments and interventions completed:  Yes     Care Coordination Interventions:  Yes, provided  Interventions Today    Flowsheet Row Most Recent Value  Chronic Disease   Chronic disease during today's visit Other  [recent hospitalized for: ulcer, diverticulitis,]  General Interventions   General Interventions Discussed/Reviewed Community Resources  Mental Health Interventions   Mental Health Discussed/Reviewed Mental Health Discussed, Coping Strategies, Anxiety, Grief and Loss  [overall stress-seeing Counselor but needs rescheduling-encouraged pt to call today to get follow up scheduled.]        Follow up plan: Follow up call scheduled for 11/25/22    Encounter Outcome:  Pt. Visit Completed

## 2022-11-03 ENCOUNTER — Telehealth: Payer: Self-pay | Admitting: *Deleted

## 2022-11-03 ENCOUNTER — Telehealth: Payer: Self-pay

## 2022-11-03 NOTE — Telephone Encounter (Signed)
Called pt, no answer x 2... appt canceled and msg sent via Mychart

## 2022-11-03 NOTE — Telephone Encounter (Signed)
   Telephone encounter was:  Successful.  11/03/2022 Name: Natalie Edwards MRN: [id number] DOB: 07-12-1965  [name] is a 57 y.o. year old female who is a primary care patient of [name], MD . The community resource team was consulted for assistance with HOUSING , FOOD , FINANCES   Care guide performed the following interventions: Patient provided with information about care guide support team and interviewed to confirm resource needs. Provided [facility] care [facility] referral . Food, housing , navigator also provided resources via email .   Follow Up Plan:  No further follow up planned at this time. The patient has been provided with needed resources.  Yehuda Mao Greenauer -[facility] [place], Population Health [phone] [address] [place] , [place] [address] Email : [email or web address]

## 2022-11-04 ENCOUNTER — Other Ambulatory Visit: Payer: Self-pay | Admitting: Pulmonary Disease

## 2022-11-04 ENCOUNTER — Ambulatory Visit: Payer: 59 | Admitting: Gastroenterology

## 2022-11-04 MED ORDER — BELSOMRA 20 MG PO TABS: 1.0000 | ORAL_TABLET | Freq: Every day | ORAL | 2 refills | Status: DC

## 2022-11-04 MED ORDER — TEMAZEPAM 30 MG PO CAPS: 30.0000 mg | ORAL_CAPSULE | Freq: Every evening | ORAL | 3 refills | Status: DC | PRN

## 2022-11-10 ENCOUNTER — Ambulatory Visit: Payer: Self-pay | Admitting: *Deleted

## 2022-11-10 NOTE — Telephone Encounter (Signed)
Patient called to let know parking place card is ready to be picked up . Patient also asked about paperwork from [place] disability. I made patient aware that I have not received anything and the last thing that was filled out from them has been sent in .

## 2022-11-11 NOTE — Patient Instructions (Signed)
Visit Information  Thank you for taking time to visit with me today. Please don't hesitate to contact me if I can be of assistance to you.   Following are the goals we discussed today:   Goals Addressed             This Visit's Progress    Reduce stress related to financial strains        Activities and task to complete in order to accomplish goals.   Call/apply for SS Disability as you indicate is your plan now Follow up with housing resources discussed (sent you an email for resource/support to prevent your eviction) Call [facility] [phone] to schedule your counseling appointment/paperwork completed. Follow up on medical appointments: Hematology, GI, etc Call [facility] program discussed- # provided for eviction assistance         Our next appointment is by telephone on 11/25/22   Please call the care guide team at [phone] if you need to cancel or reschedule your appointment.   If you are experiencing a Mental Health or Behavioral Health Crisis or need someone to talk to, please call the Suicide and Crisis Lifeline: 988 call 911   The patient verbalized understanding of instructions, educational materials, and care plan provided today and DECLINED offer to receive copy of patient instructions, educational materials, and care plan.   Telephone follow up appointment with care management team member scheduled for: 11/25/22  Reece Levy, MSW, LCSW Clinical Social Worker [phone]

## 2022-11-11 NOTE — Patient Outreach (Signed)
  Care Coordination   Follow Up Visit Note  Late Entry 11/11/2022 Name: Natalie Edwards MRN: [id number] DOB: 1965-12-05  Natalie Edwards is a 57 y.o. year old female who sees Natalie Skeans, MD for primary care. I spoke with  Natalie Edwards by phone today.  What matters to the patients health and wellness today?  Pt reports her landlord is "taking out eviction papers".     Goals Addressed             This Visit's Progress    Reduce stress related to financial strains        Activities and task to complete in order to accomplish goals.   Call/apply for SS Disability as you indicate is your plan now Follow up with housing resources discussed (sent you an email for resource/support to prevent your eviction) Call [facility] [phone] to schedule your counseling appointment/paperwork completed. Follow up on medical appointments: Hematology, GI, etc Call [facility] program discussed- # provided for eviction assistance         SDOH assessments and interventions completed:  Yes     Care Coordination Interventions:  Yes, provided  Interventions Today    Flowsheet Row Most Recent Value  Chronic Disease   Chronic disease during today's visit Other  [financial stressors]  General Interventions   General Interventions Discussed/Reviewed [employer] has been provided with resources for housing support-]  Mental Health Interventions   Mental Health Discussed/Reviewed Mental Health Discussed, Crisis, Depression  [Pt remains worried about losing her housing- resources have been provided to seek assistance and for other housing options to search]       Follow up plan: Follow up call scheduled for 11/25/22    Encounter Outcome:  Pt. Visit Completed

## 2022-11-23 ENCOUNTER — Other Ambulatory Visit: Payer: Self-pay | Admitting: Pulmonary Disease

## 2022-11-24 ENCOUNTER — Telehealth: Payer: Self-pay | Admitting: Pulmonary Disease

## 2022-11-24 ENCOUNTER — Telehealth: Payer: Self-pay | Admitting: Family Medicine

## 2022-11-24 NOTE — Telephone Encounter (Signed)
Copied from CRM [id number]. Topic: General - Inquiry >> Nov 24, 2022 11:10 AM Patsy Lager T wrote: Reason for CRM: patient called to f/u on paperwork that was faxed over from [facility]. Please f/u with patient as she can not get paid until paperwork is recvd and completed

## 2022-11-24 NOTE — Telephone Encounter (Signed)
Patient states needs refill for Belsomra. Pharmacy is [address] Patient phone number is [phone]

## 2022-11-24 NOTE — Telephone Encounter (Signed)
Patient call and is aware that we are awaiting medical records to send records to [facility]

## 2022-11-24 NOTE — Telephone Encounter (Signed)
Script sent  

## 2022-11-25 ENCOUNTER — Other Ambulatory Visit: Payer: Self-pay | Admitting: Pulmonary Disease

## 2022-11-25 ENCOUNTER — Ambulatory Visit: Payer: Self-pay | Admitting: *Deleted

## 2022-11-25 NOTE — Telephone Encounter (Signed)
Prescription was sent in 11/24/2022

## 2022-11-25 NOTE — Patient Outreach (Signed)
  Care Coordination   Follow Up Visit Note   11/25/2022 Name: Natalie Edwards MRN: [id number] DOB: [birth year]  Natalie Edwards is a 57 y.o. year old female who sees Natalie Skeans, MD for primary care. I spoke with  Natalie Edwards by phone today.  What matters to the patients health and wellness today?  Still have not worked out a Higher education careers adviser for housing. Awaiting SS Disability determination.    Goals Addressed             This Visit's Progress    Reduce stress related to financial strains        Activities and task to complete in order to accomplish goals.   Await further word from SS Disability   Follow up with housing resources discussed (sent you an email for resource/support to prevent your eviction) Call [facility] [phone] to schedule your counseling appointment/paperwork completed. Follow up on medical appointments: Hematology, GI, etc Call [facility] program discussed- # provided for eviction assistance         SDOH assessments and interventions completed:  Yes     Care Coordination Interventions:  Yes, provided  Interventions Today    Flowsheet Row Most Recent Value  General Interventions   General Interventions Discussed/Reviewed Community Resources  Mental Health Interventions   Mental Health Discussed/Reviewed Mental Health Discussed, Coping Strategies  [Pt continues to stress about her housing and finances- she is not paid up on her rent and unsure when she may be evicted. CSW suggested she continue to call the resources provided for possible help.]       Follow up plan: Follow up call scheduled for 12/18/22    Encounter Outcome:  Patient Visit Completed

## 2022-11-25 NOTE — Patient Instructions (Signed)
Visit Information  Thank you for taking time to visit with me today. Please don't hesitate to contact me if I can be of assistance to you.   Following are the goals we discussed today:   Goals Addressed             This Visit's Progress    Reduce stress related to financial strains        Activities and task to complete in order to accomplish goals.   Await further word from SS Disability   Follow up with housing resources discussed (sent you an email for resource/support to prevent your eviction) Call [facility] [phone] to schedule your counseling appointment/paperwork completed. Follow up on medical appointments: Hematology, GI, etc Call [facility] program discussed- # provided for eviction assistance         Our next appointment is by telephone on 12/18/22  Please call the care guide team at [phone] if you need to cancel or reschedule your appointment.   If you are experiencing a Mental Health or Behavioral Health Crisis or need someone to talk to, please call the Suicide and Crisis Lifeline: 988 call 911   The patient verbalized understanding of instructions, educational materials, and care plan provided today and DECLINED offer to receive copy of patient instructions, educational materials, and care plan.   Telephone follow up appointment with care management team member scheduled for: 12/18/22  Reece Levy, MSW, LCSW Clinical Social Worker [phone]

## 2022-11-27 ENCOUNTER — Telehealth: Payer: Self-pay | Admitting: Family Medicine

## 2022-11-27 NOTE — Telephone Encounter (Signed)
Copied from CRM [id number]. Topic: General - Inquiry >> Nov 27, 2022  9:20 AM Haroldine Laws wrote: Reason for CRM: pt says she thinks she is getting the wrong medication for her blood pressure.  She said they changed the medication at the hospital and she is not using [facility] any more.    CB#  [phone]

## 2022-11-27 NOTE — Telephone Encounter (Signed)
Patient was calling to check the status of this request. Please advise and contact patient back  @ # [phone].

## 2022-11-27 NOTE — Telephone Encounter (Signed)
Patient has been called and not sure of medication. Patient said she will call back when she get home with the correct medication.

## 2022-11-27 NOTE — Telephone Encounter (Signed)
Patient called back with name of medication Amlodipine 10mg  besylatf

## 2022-11-28 ENCOUNTER — Other Ambulatory Visit: Payer: Self-pay | Admitting: *Deleted

## 2022-11-28 ENCOUNTER — Ambulatory Visit: Payer: Self-pay | Admitting: *Deleted

## 2022-11-28 DIAGNOSIS — I10 Essential (primary) hypertension: Secondary | ICD-10-CM

## 2022-11-28 MED ORDER — HYDROCHLOROTHIAZIDE 25 MG PO TABS: 25.0000 mg | ORAL_TABLET | Freq: Every day | ORAL | 0 refills | Status: DC

## 2022-11-28 MED ORDER — AMLODIPINE BESY-BENAZEPRIL HCL 10-40 MG PO CAPS: 1.0000 | ORAL_CAPSULE | Freq: Every day | ORAL | 1 refills | Status: DC

## 2022-11-28 NOTE — Telephone Encounter (Signed)
Medication has been sent patient is aware.

## 2022-11-28 NOTE — Telephone Encounter (Signed)
Pt states she no longer takes the amLODipine-benazepril  on her chart.   However, I do not see where DR Andrey Campanile has ever prescribed. Pt states she thought this was going to be done yesterday.  Please send to  [facility] [address], [place] - [address]

## 2022-11-28 NOTE — Telephone Encounter (Signed)
  Chief Complaint: medication confusion regarding current medications to be taking vs medication she states were "told to stop taking" from ED visit. Last doc. 10/24/22 Symptoms: confused regarding medications belsomra, amlodipine-benazeril (lotrel) and HCTZ Frequency: na Pertinent Negatives: Patient denies na Disposition: [] ED /[] Urgent Care (no appt availability in office) / [] Appointment(In office/virtual)/ []  [facility]/ [] Home Care/ [] Refused Recommended Disposition /[] [facility]/ [x]  Follow-up with PCP Additional Notes:   Attempted to review current medication list with patient and which medications patient is to be taking. Patient confused and becomes anxious when explaining medications and what they are for. During call , poor connection noted and call was disconnected before NT able to schedule appt to discuss medications with PCP. Called patient back on # [phone] and no answer, unable to leave message. Please advise.      Summary: medication confusion   Please Medication Refill TE from yesterday. Pt has called to the office a couple of times yesterday, unsure of what blood pressure medication she should be taking. Pt called the office back and stated that she need amLODipine-benazepril (LOTREL) 10-40 MG capsule called into the pharmacy for her, then pt called back this morning stating that she does not take amlodipine. The office sent hydrochlorothiazide (HYDRODIURIL) 25 MG tablet to the pharmacy for the pt today and now the pt is calling back stating that the wrong medication was sent to the pharmacy and she should be taking amLODipine-benazepril (LOTREL) 10-40 MG capsule.. Please advise.            Reason for Disposition  [1] Caller has URGENT medicine question about med that PCP or specialist prescribed AND [2] triager unable to answer question  Answer Assessment - Initial Assessment Questions 1. NAME of MEDICINE: "What medicine(s) are you calling  about?"     Belsomra, amlodipine- benzopril(lotrel) and HCTZ 2. QUESTION: "What is your question?" (e.g., double dose of medicine, side effect)     What medications should patient be taking since recent hospitalization? Was told to stop taking amlodipine? 3. PRESCRIBER: "Who prescribed the medicine?" Reason: if prescribed by specialist, call should be referred to that group.     Na  4. SYMPTOMS: "Do you have any symptoms?" If Yes, ask: "What symptoms are you having?"  "How bad are the symptoms (e.g., mild, moderate, severe)     na 5. PREGNANCY:  "Is there any chance that you are pregnant?" "When was your last menstrual period?"     na  Protocols used: Medication Question Call-A-AH

## 2022-11-28 NOTE — Telephone Encounter (Signed)
Sent to pharmacy 

## 2022-12-02 ENCOUNTER — Telehealth: Payer: Self-pay | Admitting: *Deleted

## 2022-12-02 NOTE — Telephone Encounter (Signed)
Patient request medication refill.

## 2022-12-03 ENCOUNTER — Telehealth: Payer: Self-pay | Admitting: Family Medicine

## 2022-12-03 ENCOUNTER — Other Ambulatory Visit: Payer: Self-pay | Admitting: Family Medicine

## 2022-12-03 MED ORDER — MIRTAZAPINE 45 MG PO TABS: 45.0000 mg | ORAL_TABLET | Freq: Every day | ORAL | 1 refills | Status: DC

## 2022-12-03 NOTE — Telephone Encounter (Signed)
Medication has been refilled.

## 2022-12-03 NOTE — Telephone Encounter (Signed)
Already ordered today in a separate refill encounter.

## 2022-12-03 NOTE — Telephone Encounter (Signed)
Medication Refill - Medication: mirtazapine (REMERON) 45 MG tablet   Pt is calling back to f/u on medication refill.Patient is going out of town and needs her medication. Please advise.  Has the patient contacted their pharmacy? Yes.    (Agent: If yes, when and what did the pharmacy advise?) [id number] [place] ([place]), [place] - [address]  [address] [place] ([place]) [place] [address]  Phone: [phone] Fax: [phone]  Hours: Not open 24 hours   Preferred Pharmacy (with phone number or street name):  Has the patient been seen for an appointment in the last year OR does the patient have an upcoming appointment? Yes.    Agent: Please be advised that RX refills may take up to 3 business days. We ask that you follow-up with your pharmacy.

## 2022-12-03 NOTE — Telephone Encounter (Signed)
Prescription Request  12/03/2022  LOV: 10/28/2022  What is the name of the medication or equipment? Mirtazaprine  Have you contacted your pharmacy to request a refill? No   Which pharmacy would you like this sent to?    Patient notified that their request is being sent to the clinical staff for review and that they should receive a response within 2 business days.   Please advise at Mobile [phone] (mobile)

## 2022-12-05 ENCOUNTER — Ambulatory Visit: Payer: 59 | Admitting: Physician Assistant

## 2022-12-06 DIAGNOSIS — Z743 Need for continuous supervision: Secondary | ICD-10-CM | POA: Diagnosis not present

## 2022-12-06 DIAGNOSIS — E876 Hypokalemia: Secondary | ICD-10-CM | POA: Diagnosis not present

## 2022-12-06 DIAGNOSIS — R112 Nausea with vomiting, unspecified: Secondary | ICD-10-CM | POA: Diagnosis not present

## 2022-12-06 DIAGNOSIS — R1084 Generalized abdominal pain: Secondary | ICD-10-CM | POA: Diagnosis not present

## 2022-12-09 ENCOUNTER — Encounter: Payer: Self-pay | Admitting: *Deleted

## 2022-12-09 ENCOUNTER — Telehealth: Payer: Self-pay | Admitting: Family Medicine

## 2022-12-09 NOTE — Telephone Encounter (Signed)
This encounter was created in error - please disregard.

## 2022-12-09 NOTE — Telephone Encounter (Signed)
Patient returned call. Pt given message per notes of Natalie Edwards from 12/09/22 from Dr. Andrey Campanile on 12/09/22. Pt verbalized understanding PCP recommending patient can see  our Mobile clinic ; accepts walk ins and provider next available appt 10/17. Gave patient location of mobile clinic tomorrow at [facility] and address.

## 2022-12-09 NOTE — Telephone Encounter (Signed)
Called pt and could not reach or leave vm. Recommending pt can see our Mobile Clinic; accepts walk-ins. Provider next available appt on 10/17

## 2022-12-09 NOTE — Telephone Encounter (Signed)
Copied from CRM [id number]. Topic: Appointment Scheduling - Scheduling Inquiry for Clinic >> Dec 09, 2022 11:13 AM Benetta Spar B wrote: Reason for CRM: pt called in to schedule hosp fu/discharged from [facility] on 09/21. Appts showing to far out in October. Please cb

## 2022-12-10 NOTE — Telephone Encounter (Signed)
noted 

## 2022-12-16 ENCOUNTER — Ambulatory Visit (INDEPENDENT_AMBULATORY_CARE_PROVIDER_SITE_OTHER): Payer: 59 | Admitting: Nurse Practitioner

## 2022-12-16 ENCOUNTER — Telehealth: Payer: Self-pay | Admitting: Pulmonary Disease

## 2022-12-16 ENCOUNTER — Encounter: Payer: Self-pay | Admitting: Nurse Practitioner

## 2022-12-16 VITALS — BP 140/90 | HR 63 | Ht 64.0 in | Wt 173.0 lb

## 2022-12-16 DIAGNOSIS — R110 Nausea: Secondary | ICD-10-CM | POA: Diagnosis not present

## 2022-12-16 DIAGNOSIS — Z8711 Personal history of peptic ulcer disease: Secondary | ICD-10-CM

## 2022-12-16 DIAGNOSIS — R1084 Generalized abdominal pain: Secondary | ICD-10-CM

## 2022-12-16 DIAGNOSIS — Z8719 Personal history of other diseases of the digestive system: Secondary | ICD-10-CM | POA: Diagnosis not present

## 2022-12-16 DIAGNOSIS — K219 Gastro-esophageal reflux disease without esophagitis: Secondary | ICD-10-CM | POA: Diagnosis not present

## 2022-12-16 DIAGNOSIS — K5900 Constipation, unspecified: Secondary | ICD-10-CM

## 2022-12-16 MED ORDER — FAMOTIDINE 20 MG PO TABS: 20.0000 mg | ORAL_TABLET | Freq: Every day | ORAL | Status: DC

## 2022-12-16 MED ORDER — PANTOPRAZOLE SODIUM 40 MG PO TBEC: 40.0000 mg | DELAYED_RELEASE_TABLET | Freq: Every day | ORAL | 5 refills | Status: DC

## 2022-12-16 MED ORDER — DICYCLOMINE HCL 20 MG PO TABS: 20.0000 mg | ORAL_TABLET | Freq: Two times a day (BID) | ORAL | 3 refills | Status: DC | PRN

## 2022-12-16 MED ORDER — ONDANSETRON HCL 4 MG PO TABS: 4.0000 mg | ORAL_TABLET | Freq: Three times a day (TID) | ORAL | 3 refills | Status: DC | PRN

## 2022-12-16 NOTE — Progress Notes (Unsigned)
ASSESSMENT    57 y.o.  female known previously to Dr. Orvan Falconer ( 2021) with a history of PUD, GERD, CKD II, iron deficiency anemia , diverticulitis , cholecystectomy, hysterectomy. Hospitalized in late July with N/V, abdominal pain / PUD   History of PUD  PUD in 2018 EGD July 2024 for N/V and abdominal pain - non bleeding gastric ulcer , reactive gastropathy  Biopsies negative for H.pylori, wasn't taking NSAIDS.   Nausea,  vomiting and generalized abdominal pain. Etiology? Symptoms out of proportion to July 2024 EGD findings. More recently  had 3 CT scans which , other than diverticulitis ( now resolved), didn't show any acute findings   GERD.  Out of pantoprazole prescription, requesting refill  Constipation  Stools are soft but she strains  Colon cancer screening.  Back in March 2024 she was referred to [place] for colon cancer screening.but wasn't seen in our office.  Per our previous office notes in 2021 she had a screening colonoscopy in 2018 with [facility] but appears records were not received . Patient not really sure when last colonoscopy was ( ? Had one done more recently than 2018).   Left sided diverticulitis Aug 2024. As above Hospitalized early August with left sided diverticulitis, resolved on follow up CT scan.   Possible ascending colon wall thickening on CT scan Sept 2024 in [place] ( see brought in paper copy)   PLAN:    --Discussed anti-reflux measures such as avoidance of late meals / bedtime snacks, HOB elevation (or use of wedge pillow), weight reduction ( if applicable)  / maintaining a healthy BMI ( body mass index),  and avoidance of trigger foods and caffeine.  --Continue Pantoprazole 40 mg daily before breakfast --Famotidine 20 mg at bedtime.  -- Refill Zofran -- Refill Bentyl 10 mg to take BID as needed -- Consider gastric emptying study if symptoms persist. Of note, she is not taking any opioids. --Can try glycerin suppositories to help with evacuation  of rectum  ---Will request colonoscopy report / path report from [facility]. If she in fact has not had a more recent one ( or if I am unable to get records) then will schedule for repeat colonoscopy   HPI   Brief GI history Patient established care here in 2020 for evaluation of epigastric pain, nausea and a history of PUD. EGD scheduled but wasn't done. Seen again Feb 2021 with similar symptoms . EGD was scheduled but again wasn't done. She was last seen by [place] during a hospital admission in July of this year.  We saw her for evaluation of a 3-4 week history of abdominal pain , nausea and vomiting. CT scan of the abdomen and pelvis without acute findings.. She underwent an inpatient EGD revealing of a nonbleeding gastric ulcer without stigmata of bleeding, gastritis and a gastric nodule.     CC: abdominal pain, nausea   INTERVAL HISTORY:  Since hospital admission in July patient was readmitted to the hospital the the beginning of August with uncomplicated diverticulitis. Repeat CT scan on 10/20/2022 for worsening pain showed improvement in the diverticulitis.  Since then she has gone back to the ED and had another CT scan with contrast on 8/6 which showed resolution of the diverticulitis .  She had another ED visit on 10/24/2022 for abdominal pain. Two view abdomen negative. Prescribed bentyl and zofran, both helped and wants refill. While visiting [place] in early September she was seen in ED for similar symptoms. Another  CT scan  ( she showed me results) showed mild cardiomegaly. No diverticulitis but possible ascending colon wall thickening vrs underdistention. She continues to abdominal discomfort though the pain is more generalized in nature and not really related to eating or BMs. She endorses constipation with straining. Stools are soft. Doesn't like Miralax as it causes nausea    Previous GI Endoscopies / Labs / Imaging   **May not include all endoscopic evaluations   09/29/22 EGD -  Z-line regular, 38 cm from the incisors. - No gross lesions in the entire esophagus. - Non-bleeding gastric ulcer with no stigmata of bleeding. - A single mucosal papule (nodule) found in the stomach. Biopsied. - Gastritis. Biopsied. - Normal examined duodenum.  A. STOMACH, ANTRUM NODULE, BIOPSY:  - Polypoid gastric antral mucosa showing marked reactive gastropathy and  features of mucosal prolapse  - Negative for intestinal metaplasia, dysplasia or malignancy   B. STOMACH, RANDOM, BIOPSY:  - Gastric antral and oxyntic mucosa with nonspecific reactive  gastropathy  - Helicobacter pylori-like organisms are not identified on routine HE  stain       Latest Ref Rng & Units 10/24/2022    1:47 PM 10/21/2022    9:27 AM 10/20/2022    4:32 PM  Hepatic Function  Total Protein 6.5 - 8.1 g/dL 7.9  7.5  7.2   Albumin 3.5 - 5.0 g/dL 4.1  3.9  3.7   AST 15 - 41 U/L 14  18  17    ALT 0 - 44 U/L 17  22  21    Alk Phosphatase 38 - 126 U/L 55  56  55   Total Bilirubin 0.3 - 1.2 mg/dL 0.5  0.4  0.3        Latest Ref Rng & Units 10/24/2022   12:31 PM 10/21/2022    9:27 AM 10/20/2022    4:32 PM  CBC  WBC 4.0 - 10.5 K/uL 13.1  12.2  13.7   Hemoglobin 12.0 - 15.0 g/dL 40.9  81.1  91.4   Hematocrit 36.0 - 46.0 % 39.4  35.2  36.0   Platelets 150 - 400 K/uL 766  661  721      Past Medical History:  Diagnosis Date   Allergy    Anemia    Diverticulosis    Gall bladder stones 1993   Gastric ulcer    GERD (gastroesophageal reflux disease)    Hypertension    Renal mass     Past Surgical History:  Procedure Laterality Date   ABDOMINAL HYSTERECTOMY     BIOPSY  09/29/2022   Procedure: BIOPSY;  Surgeon: Napoleon Form, MD;  Location: [facility];  Service: Gastroenterology;;   CHOLECYSTECTOMY     ESOPHAGOGASTRODUODENOSCOPY (EGD) WITH PROPOFOL N/A 09/29/2022   Procedure: ESOPHAGOGASTRODUODENOSCOPY (EGD) WITH PROPOFOL;  Surgeon: Napoleon Form, MD;  Location: [facility];  Service:  Gastroenterology;  Laterality: N/A;    Family History  Problem Relation Age of Onset   Cancer Maternal Uncle        Lung   Cancer Maternal Uncle        Lung   Cancer Maternal Uncle        Lung   Headache Neg Hx        "I don't think so"   Migraines Neg Hx        "I don't think so"    Current Medications, Allergies, Family History and Social History were reviewed in [employer] electronic medical record.     Current  Outpatient Medications  Medication Sig Dispense Refill   acetaminophen (TYLENOL) 325 MG tablet Take 2 tablets (650 mg total) by mouth every 6 (six) hours as needed for mild pain (or Fever >/= 101). 20 tablet 0   amLODipine-benazepril (LOTREL) 10-40 MG capsule Take 1 capsule by mouth daily. 90 capsule 1   BELSOMRA 20 MG TABS TAKE 1 TABLET BY MOUTH AT BEDTIME 30 tablet 0   dicyclomine (BENTYL) 20 MG tablet Take 1 tablet (20 mg total) by mouth 2 (two) times daily. 20 tablet 0   diphenhydramine-acetaminophen (TYLENOL PM) 25-500 MG TABS tablet Take 1 tablet by mouth at bedtime as needed (pain).     FLUoxetine (PROZAC) 20 MG capsule Take 20 mg by mouth daily.     hydrochlorothiazide (HYDRODIURIL) 25 MG tablet Take 1 tablet (25 mg total) by mouth daily. 90 tablet 0   loratadine (CLARITIN) 10 MG tablet Take 1 tablet (10 mg total) by mouth daily as needed for allergies. 30 tablet 0   mirtazapine (REMERON) 45 MG tablet Take 1 tablet (45 mg total) by mouth at bedtime. 90 tablet 1   ondansetron (ZOFRAN) 4 MG tablet Take 1 tablet (4 mg total) by mouth every 6 (six) hours as needed for nausea. 20 tablet 0   polyethylene glycol (MIRALAX / GLYCOLAX) 17 g packet Take 17 g by mouth daily as needed for mild constipation. 14 each 0   pregabalin (LYRICA) 75 MG capsule Take 75 mg by mouth 2 (two) times daily.     temazepam (RESTORIL) 30 MG capsule Take 1 capsule (30 mg total) by mouth at bedtime as needed. 30 capsule 3   pantoprazole (PROTONIX) 40 MG tablet Take 1 tablet (40 mg total) by  mouth 2 (two) times daily. 60 tablet 0   No current facility-administered medications for this visit.    Review of Systems: No chest pain. No shortness of breath. No urinary complaints.    Physical Exam  Wt Readings from Last 3 Encounters:  12/16/22 173 lb (78.5 kg)  10/28/22 175 lb 9.6 oz (79.7 kg)  10/24/22 180 lb 12.4 oz (82 kg)    BP (!) 140/90   Pulse 63   Ht 5\' 4"  (1.626 m)   Wt 173 lb (78.5 kg)   BMI 29.70 kg/m  Constitutional:  Pleasant, generally well appearing female in no acute distress. Psychiatric: Normal mood and affect. Behavior is normal. EENT: Pupils normal.  Conjunctivae are normal. No scleral icterus. Neck supple.  Cardiovascular: Normal rate, regular rhythm.  Pulmonary/chest: Effort normal and breath sounds normal. No wheezing, rales or rhonchi. Abdominal: Soft, nondistended, mild generalized tenderness.  Bowel sounds active throughout. There are no masses palpable. No hepatomegaly. Neurological: Alert and oriented to person place and time.   I spent 30 minutes total reviewing records, obtaining history, performing exam, counseling patient and documenting visit / findings.   Willette Cluster, NP  12/16/2022, 9:17 AM  Cc:  [name], MD

## 2022-12-16 NOTE — Telephone Encounter (Signed)
Contacted pt to get her sch for her appt w/ Dr. Wynona Neat. Pt requesting refill for dicyclomine (BENTYL) 20 MG tablet   Pharmacy: [id number] - [place] ([place]), [place] - [address]

## 2022-12-16 NOTE — Patient Instructions (Addendum)
_______________________________________________________  If your blood pressure at your visit was 140/90 or greater, please contact your primary care physician to follow up on this.  If you are age 58 or younger, your body mass index should be between 19-25. Your Body mass index is 29.7 kg/m. If this is out of the aformentioned range listed, please consider follow up with your Primary Care Provider.  ________________________________________________________  The  GI providers would like to encourage you to use [facility] to communicate with providers for non-urgent requests or questions.  Due to long hold times on the telephone, sending your provider a message by [facility] may be a faster and more efficient way to get a response.  Please allow 48 business hours for a response.  Please remember that this is for non-urgent requests.  _______________________________________________________  We have sent the following medications to your pharmacy for you to pick up at your convenience:  RESTART: Protonix 40mg  one tablet every morning 30 minutes prior to breakfast meal each day. CONTINUE: Dicyclomine 20mg  one tablet two times daily as needed. Zofran 4mg  one tablet every 8 hours as needed for nausea.  Please purchase the following medications over the counter and take as directed:  START: famotidine 20mg  one tablet at bedtime each night.  Thank you for entrusting me with your care and choosing [facility].  Willette Cluster, NP

## 2022-12-18 ENCOUNTER — Encounter: Payer: Self-pay | Admitting: *Deleted

## 2022-12-18 ENCOUNTER — Encounter: Payer: Self-pay | Admitting: Nurse Practitioner

## 2022-12-19 ENCOUNTER — Telehealth: Payer: Self-pay | Admitting: *Deleted

## 2022-12-19 NOTE — Telephone Encounter (Signed)
Patient requesting refill of Belsomra 20 mg.([facility] [address]) Patient scheduled f/u 02/09/23.

## 2022-12-19 NOTE — Patient Outreach (Signed)
Care Coordination   12/19/2022 Name: Natalie Edwards MRN: [id number] DOB: 04-Jun-1965   Care Coordination Outreach Attempts:  An unsuccessful telephone outreach was attempted today to offer the patient information about available care coordination services.  Follow Up Plan:  Additional outreach attempts will be made to offer the patient care coordination information and services.   Encounter Outcome:  No Answer   Care Coordination Interventions:  No, not indicated    Reece Levy, MSW, LCSW Clinical Social Worker [phone]

## 2022-12-21 ENCOUNTER — Encounter: Payer: Self-pay | Admitting: Nurse Practitioner

## 2022-12-22 ENCOUNTER — Encounter: Payer: Self-pay | Admitting: Internal Medicine

## 2022-12-22 ENCOUNTER — Encounter (HOSPITAL_COMMUNITY): Payer: Self-pay

## 2022-12-22 ENCOUNTER — Other Ambulatory Visit: Payer: Self-pay | Admitting: *Deleted

## 2022-12-22 ENCOUNTER — Telehealth: Payer: Self-pay | Admitting: *Deleted

## 2022-12-22 ENCOUNTER — Other Ambulatory Visit: Payer: Self-pay | Admitting: Pulmonary Disease

## 2022-12-22 ENCOUNTER — Encounter: Payer: Self-pay | Admitting: Pulmonary Disease

## 2022-12-22 ENCOUNTER — Emergency Department (HOSPITAL_COMMUNITY)
Admission: EM | Admit: 2022-12-22 | Discharge: 2022-12-23 | Disposition: A | Discharge status: Other Acute Inpatient Hospital | Payer: 59 | Attending: Emergency Medicine | Admitting: Emergency Medicine

## 2022-12-22 ENCOUNTER — Other Ambulatory Visit: Payer: Self-pay

## 2022-12-22 DIAGNOSIS — F4321 Adjustment disorder with depressed mood: Secondary | ICD-10-CM | POA: Diagnosis not present

## 2022-12-22 DIAGNOSIS — R519 Headache, unspecified: Secondary | ICD-10-CM | POA: Insufficient documentation

## 2022-12-22 DIAGNOSIS — K257 Chronic gastric ulcer without hemorrhage or perforation: Secondary | ICD-10-CM

## 2022-12-22 DIAGNOSIS — R1012 Left upper quadrant pain: Secondary | ICD-10-CM | POA: Insufficient documentation

## 2022-12-22 DIAGNOSIS — Z79899 Other long term (current) drug therapy: Secondary | ICD-10-CM | POA: Insufficient documentation

## 2022-12-22 DIAGNOSIS — F322 Major depressive disorder, single episode, severe without psychotic features: Secondary | ICD-10-CM

## 2022-12-22 DIAGNOSIS — R112 Nausea with vomiting, unspecified: Secondary | ICD-10-CM

## 2022-12-22 DIAGNOSIS — R45851 Suicidal ideations: Secondary | ICD-10-CM | POA: Diagnosis not present

## 2022-12-22 DIAGNOSIS — Z20822 Contact with and (suspected) exposure to covid-19: Secondary | ICD-10-CM | POA: Insufficient documentation

## 2022-12-22 DIAGNOSIS — N189 Chronic kidney disease, unspecified: Secondary | ICD-10-CM | POA: Diagnosis not present

## 2022-12-22 DIAGNOSIS — F411 Generalized anxiety disorder: Secondary | ICD-10-CM | POA: Diagnosis present

## 2022-12-22 DIAGNOSIS — I129 Hypertensive chronic kidney disease with stage 1 through stage 4 chronic kidney disease, or unspecified chronic kidney disease: Secondary | ICD-10-CM | POA: Insufficient documentation

## 2022-12-22 DIAGNOSIS — R1013 Epigastric pain: Secondary | ICD-10-CM | POA: Diagnosis not present

## 2022-12-22 DIAGNOSIS — F17210 Nicotine dependence, cigarettes, uncomplicated: Secondary | ICD-10-CM | POA: Insufficient documentation

## 2022-12-22 DIAGNOSIS — K259 Gastric ulcer, unspecified as acute or chronic, without hemorrhage or perforation: Secondary | ICD-10-CM

## 2022-12-22 DIAGNOSIS — F332 Major depressive disorder, recurrent severe without psychotic features: Secondary | ICD-10-CM | POA: Diagnosis not present

## 2022-12-22 DIAGNOSIS — G8929 Other chronic pain: Secondary | ICD-10-CM | POA: Diagnosis not present

## 2022-12-22 DIAGNOSIS — I10 Essential (primary) hypertension: Secondary | ICD-10-CM | POA: Diagnosis not present

## 2022-12-22 DIAGNOSIS — R110 Nausea: Secondary | ICD-10-CM

## 2022-12-22 DIAGNOSIS — K5792 Diverticulitis of intestine, part unspecified, without perforation or abscess without bleeding: Secondary | ICD-10-CM

## 2022-12-22 LAB — COMPREHENSIVE METABOLIC PANEL
ALT: 32 U/L (ref 0–44)
AST: 20 U/L (ref 15–41)
Albumin: 4.2 g/dL (ref 3.5–5.0)
Alkaline Phosphatase: 72 U/L (ref 38–126)
Anion gap: 14 (ref 5–15)
BUN: 11 mg/dL (ref 6–20)
CO2: 17 mmol/L — ABNORMAL LOW (ref 22–32)
Calcium: 9.6 mg/dL (ref 8.9–10.3)
Chloride: 106 mmol/L (ref 98–111)
Creatinine, Ser: 1.12 mg/dL — ABNORMAL HIGH (ref 0.44–1.00)
GFR, Estimated: 57 mL/min — ABNORMAL LOW (ref >60)
Glucose, Bld: 113 mg/dL — ABNORMAL HIGH (ref 70–99)
Potassium: 3.9 mmol/L (ref 3.5–5.1)
Sodium: 137 mmol/L (ref 135–145)
Total Bilirubin: 0.4 mg/dL (ref 0.3–1.2)
Total Protein: 8 g/dL (ref 6.5–8.1)

## 2022-12-22 LAB — SARS CORONAVIRUS 2 BY RT PCR: SARS Coronavirus 2 by RT PCR: NEGATIVE

## 2022-12-22 LAB — ACETAMINOPHEN LEVEL: Acetaminophen (Tylenol), Serum: <10 ug/mL — ABNORMAL LOW (ref 10–30)

## 2022-12-22 LAB — CBC
HCT: 41.1 % (ref 36.0–46.0)
Hemoglobin: 13.1 g/dL (ref 12.0–15.0)
MCH: 27.2 pg (ref 26.0–34.0)
MCHC: 31.9 g/dL (ref 30.0–36.0)
MCV: 85.3 fL (ref 80.0–100.0)
Platelets: 648 10*3/uL — ABNORMAL HIGH (ref 150–400)
RBC: 4.82 MIL/uL (ref 3.87–5.11)
RDW: 18.6 % — ABNORMAL HIGH (ref 11.5–15.5)
WBC: 13.6 10*3/uL — ABNORMAL HIGH (ref 4.0–10.5)
nRBC: 0 % (ref 0.0–0.2)

## 2022-12-22 LAB — RAPID URINE DRUG SCREEN, HOSP PERFORMED
Amphetamines: NOT DETECTED
Barbiturates: NOT DETECTED
Benzodiazepines: POSITIVE — AB
Cocaine: NOT DETECTED
Opiates: NOT DETECTED
Tetrahydrocannabinol: NOT DETECTED

## 2022-12-22 LAB — BASIC METABOLIC PANEL
Anion gap: 11 (ref 5–15)
BUN: 10 mg/dL (ref 6–20)
CO2: 18 mmol/L — ABNORMAL LOW (ref 22–32)
Calcium: 9.5 mg/dL (ref 8.9–10.3)
Chloride: 108 mmol/L (ref 98–111)
Creatinine, Ser: 0.93 mg/dL (ref 0.44–1.00)
GFR, Estimated: >60 mL/min (ref >60)
Glucose, Bld: 89 mg/dL (ref 70–99)
Potassium: 4 mmol/L (ref 3.5–5.1)
Sodium: 137 mmol/L (ref 135–145)

## 2022-12-22 LAB — ETHANOL: Alcohol, Ethyl (B): <10 mg/dL (ref <10)

## 2022-12-22 LAB — SALICYLATE LEVEL: Salicylate Lvl: <7 mg/dL — ABNORMAL LOW (ref 7.0–30.0)

## 2022-12-22 MED ORDER — PANTOPRAZOLE SODIUM 40 MG PO TBEC
40.0000 mg | DELAYED_RELEASE_TABLET | Freq: Once | ORAL | Status: AC
  Administered 2022-12-22: 40 mg via ORAL
  Filled 2022-12-22: qty 1

## 2022-12-22 MED ORDER — MIRTAZAPINE 15 MG PO TABS
45.0000 mg | ORAL_TABLET | Freq: Every day | ORAL | Status: DC
  Administered 2022-12-22: 45 mg via ORAL
  Filled 2022-12-22: qty 3

## 2022-12-22 MED ORDER — ONDANSETRON HCL 4 MG PO TABS
4.0000 mg | ORAL_TABLET | Freq: Three times a day (TID) | ORAL | Status: DC | PRN
  Administered 2022-12-22: 4 mg via ORAL
  Filled 2022-12-22: qty 1

## 2022-12-22 MED ORDER — LORATADINE 10 MG PO TABS: 10.0000 mg | ORAL_TABLET | Freq: Every day | ORAL | Status: DC | PRN

## 2022-12-22 MED ORDER — LACTATED RINGERS IV BOLUS
1000.0000 mL | Freq: Once | INTRAVENOUS | Status: AC
  Administered 2022-12-22: 1000 mL via INTRAVENOUS

## 2022-12-22 MED ORDER — FLUOXETINE HCL 20 MG PO CAPS
20.0000 mg | ORAL_CAPSULE | Freq: Every day | ORAL | Status: DC
  Administered 2022-12-22: 20 mg via ORAL
  Filled 2022-12-22: qty 1

## 2022-12-22 MED ORDER — SUVOREXANT 20 MG PO TABS: 1.0000 | ORAL_TABLET | Freq: Every day | ORAL | Status: DC

## 2022-12-22 MED ORDER — POLYETHYLENE GLYCOL 3350 17 G PO PACK: 17.0000 g | PACK | Freq: Every day | ORAL | Status: DC | PRN

## 2022-12-22 MED ORDER — FAMOTIDINE 20 MG PO TABS
20.0000 mg | ORAL_TABLET | Freq: Every day | ORAL | Status: DC
  Administered 2022-12-22: 20 mg via ORAL
  Filled 2022-12-22: qty 1

## 2022-12-22 MED ORDER — DIPHENHYDRAMINE-APAP (SLEEP) 25-500 MG PO TABS: 1.0000 | ORAL_TABLET | Freq: Every evening | ORAL | Status: DC | PRN

## 2022-12-22 MED ORDER — ACETAMINOPHEN 325 MG PO TABS: 650.0000 mg | ORAL_TABLET | ORAL | Status: DC | PRN

## 2022-12-22 MED ORDER — ONDANSETRON HCL 4 MG PO TABS: 4.0000 mg | ORAL_TABLET | Freq: Three times a day (TID) | ORAL | Status: DC | PRN

## 2022-12-22 MED ORDER — DICYCLOMINE HCL 20 MG PO TABS
20.0000 mg | ORAL_TABLET | Freq: Two times a day (BID) | ORAL | Status: DC | PRN
  Administered 2022-12-22: 20 mg via ORAL
  Filled 2022-12-22: qty 1

## 2022-12-22 MED ORDER — ACETAMINOPHEN 325 MG PO TABS: 650.0000 mg | ORAL_TABLET | Freq: Four times a day (QID) | ORAL | Status: DC | PRN

## 2022-12-22 MED ORDER — BENAZEPRIL HCL 20 MG PO TABS
40.0000 mg | ORAL_TABLET | Freq: Every day | ORAL | Status: DC
  Administered 2022-12-22: 40 mg via ORAL
  Filled 2022-12-22: qty 2

## 2022-12-22 MED ORDER — BELSOMRA 20 MG PO TABS: 1.0000 | ORAL_TABLET | Freq: Every day | ORAL | 1 refills | Status: DC

## 2022-12-22 MED ORDER — TEMAZEPAM 30 MG PO CAPS
30.0000 mg | ORAL_CAPSULE | Freq: Every evening | ORAL | Status: DC | PRN
  Administered 2022-12-22: 30 mg via ORAL
  Filled 2022-12-22: qty 2

## 2022-12-22 MED ORDER — PANTOPRAZOLE SODIUM 40 MG PO TBEC
40.0000 mg | DELAYED_RELEASE_TABLET | Freq: Every day | ORAL | Status: DC
  Administered 2022-12-22: 40 mg via ORAL
  Filled 2022-12-22: qty 1

## 2022-12-22 MED ORDER — HYDROCODONE-ACETAMINOPHEN 5-325 MG PO TABS
1.0000 | ORAL_TABLET | Freq: Once | ORAL | Status: AC
  Administered 2022-12-22: 1 via ORAL
  Filled 2022-12-22: qty 1

## 2022-12-22 MED ORDER — HYDROCHLOROTHIAZIDE 25 MG PO TABS
25.0000 mg | ORAL_TABLET | Freq: Every day | ORAL | Status: DC
  Administered 2022-12-22: 25 mg via ORAL
  Filled 2022-12-22: qty 1

## 2022-12-22 MED ORDER — AMLODIPINE BESYLATE 5 MG PO TABS
10.0000 mg | ORAL_TABLET | Freq: Every day | ORAL | Status: DC
  Administered 2022-12-22: 10 mg via ORAL
  Filled 2022-12-22: qty 2

## 2022-12-22 MED ORDER — SUCRALFATE 1 G PO TABS
1.0000 g | ORAL_TABLET | Freq: Once | ORAL | Status: AC
  Administered 2022-12-22: 1 g via ORAL
  Filled 2022-12-22: qty 1

## 2022-12-22 MED ORDER — PREGABALIN 25 MG PO CAPS
75.0000 mg | ORAL_CAPSULE | Freq: Two times a day (BID) | ORAL | Status: DC
  Administered 2022-12-22 (×2): 75 mg via ORAL
  Filled 2022-12-22 (×2): qty 3

## 2022-12-22 NOTE — ED Notes (Signed)
Security wand patient

## 2022-12-22 NOTE — ED Triage Notes (Signed)
Pt called psychiatrist and threatened to overdose on her medication. Psychaitrist name is Baird Cancer from [facility] #[phone]. Pt c/o nausea and HA.

## 2022-12-22 NOTE — ED Provider Notes (Signed)
[place] EMERGENCY DEPARTMENT AT [facility] Provider Note   CSN: [id number] Arrival date & time: 12/22/22  7829     History  Chief Complaint  Patient presents with   Suicidal    Natalie Edwards is a 57 y.o. female.  HPI    57 year old female comes in with chief complaint of suicidal ideation.  Patient has history of chronic abdominal pain secondary to peptic ulcer disease, CKD, thrombocytopenia and chronic headaches.  She indicates that she also has depression.  Last year she lost her mother.  She has been struggling with the loss.  More recently, she had to leave her job and her disability was declined.  In the interim, her bills have been piling up, adding to her stress.  She feels helpless, hopeless and has had thoughts of hurting herself with overdosing on prescription pills.  Patient has attempted to overdose in the past, that was 14 years ago.  Currently patient is complaining of headache and abdominal pain that are typical of her chronic pain.  She has not had anything to eat since the last 24 hours because of pain and loss of appetite.  Home Medications Prior to Admission medications   Medication Sig Start Date End Date Taking? Authorizing Provider  acetaminophen (TYLENOL) 325 MG tablet Take 2 tablets (650 mg total) by mouth every 6 (six) hours as needed for mild pain (or Fever >/= 101). 09/30/22   Sheikh, Omair Latif, DO  amLODipine-benazepril (LOTREL) 10-40 MG capsule Take 1 capsule by mouth daily. 11/28/22 05/27/23  [name], MD  BELSOMRA 20 MG TABS Take 1 tablet (20 mg total) by mouth at bedtime. 12/22/22   Olalere, Minna Antis, MD  dicyclomine (BENTYL) 20 MG tablet Take 1 tablet (20 mg total) by mouth 2 (two) times daily as needed for spasms. 12/16/22   Meredith Pel, NP  diphenhydramine-acetaminophen (TYLENOL PM) 25-500 MG TABS tablet Take 1 tablet by mouth at bedtime as needed (pain).    [provider]  famotidine (PEPCID) 20 MG tablet Take 1  tablet (20 mg total) by mouth at bedtime. 12/16/22   Meredith Pel, NP  FLUoxetine (PROZAC) 20 MG capsule Take 20 mg by mouth daily. 10/02/22   [provider]  hydrochlorothiazide (HYDRODIURIL) 25 MG tablet Take 1 tablet (25 mg total) by mouth daily. 11/28/22   [name], MD  loratadine (CLARITIN) 10 MG tablet Take 1 tablet (10 mg total) by mouth daily as needed for allergies. 09/24/22   Bing Neighbors, NP  mirtazapine (REMERON) 45 MG tablet Take 1 tablet (45 mg total) by mouth at bedtime. 12/03/22   [name], MD  ondansetron (ZOFRAN) 4 MG tablet Take 1 tablet (4 mg total) by mouth every 8 (eight) hours as needed for nausea. 12/16/22   Meredith Pel, NP  pantoprazole (PROTONIX) 40 MG tablet Take 1 tablet (40 mg total) by mouth daily. Take 1 tablet 30 minutes prior to breakfast each day 12/16/22   Meredith Pel, NP  polyethylene glycol (MIRALAX / GLYCOLAX) 17 g packet Take 17 g by mouth daily as needed for mild constipation. 09/30/22   Marguerita Merles Latif, DO  pregabalin (LYRICA) 75 MG capsule Take 75 mg by mouth 2 (two) times daily. 10/25/22   [provider]  temazepam (RESTORIL) 30 MG capsule Take 1 capsule (30 mg total) by mouth at bedtime as needed. 11/04/22   Tomma Lightning, MD      Allergies    Hydroxyzine and Ambien [  zolpidem tartrate]    Review of Systems   Review of Systems  All other systems reviewed and are negative.   Physical Exam Updated Vital Signs BP (!) 161/101   Pulse (!) 114   Temp 98.1 F (36.7 C) (Oral)   Resp (!) 22   Ht 5\' 4"  (1.626 m)   Wt 78.5 kg   SpO2 99%   BMI 29.71 kg/m  Physical Exam Vitals and nursing note reviewed.  Constitutional:      Appearance: She is well-developed.  HENT:     Head: Atraumatic.  Cardiovascular:     Rate and Rhythm: Normal rate.  Pulmonary:     Effort: Pulmonary effort is normal.  Abdominal:     Tenderness: There is abdominal tenderness.     Comments: Patient has upper quadrant  tenderness, primarily over the epigastric and left upper quadrant region, no rebound or guarding  Musculoskeletal:     Cervical back: Normal range of motion and neck supple.  Skin:    General: Skin is warm and dry.  Neurological:     Mental Status: She is alert and oriented to person, place, and time.     ED Results / Procedures / Treatments   Labs (all labs ordered are listed, but only abnormal results are displayed) Labs Reviewed  COMPREHENSIVE METABOLIC PANEL - Abnormal; Notable for the following components:      Result Value   CO2 17 (*)    Glucose, Bld 113 (*)    Creatinine, Ser 1.12 (*)    GFR, Estimated 57 (*)    All other components within normal limits  SALICYLATE LEVEL - Abnormal; Notable for the following components:   Salicylate Lvl <7.0 (*)    All other components within normal limits  ACETAMINOPHEN LEVEL - Abnormal; Notable for the following components:   Acetaminophen (Tylenol), Serum <10 (*)    All other components within normal limits  CBC - Abnormal; Notable for the following components:   WBC 13.6 (*)    RDW 18.6 (*)    Platelets 648 (*)    All other components within normal limits  RAPID URINE DRUG SCREEN, HOSP PERFORMED - Abnormal; Notable for the following components:   Benzodiazepines POSITIVE (*)    All other components within normal limits  ETHANOL    EKG None  Radiology No results found.  Procedures Procedures    Medications Ordered in ED Medications  acetaminophen (TYLENOL) tablet 650 mg (has no administration in time range)  ondansetron (ZOFRAN) tablet 4 mg (has no administration in time range)  sucralfate (CARAFATE) tablet 1 g (has no administration in time range)  pantoprazole (PROTONIX) EC tablet 40 mg (has no administration in time range)  HYDROcodone-acetaminophen (NORCO/VICODIN) 5-325 MG per tablet 1 tablet (has no administration in time range)    ED Course/ Medical Decision Making/ A&P                                  Medical Decision Making Amount and/or Complexity of Data Reviewed Labs: ordered.  Risk OTC drugs. Prescription drug management.   Patient comes to the ER with cc of suicidal ideation.  Per triage note: Pt called psychiatrist and threatened to overdose on her medication. Psychaitrist name is Baird Cancer from [facility] #[phone].   Patient has pertinent past medical history of chronic abdominal pain, peptic ulcer disease, thrombocytopenia, CKD. Currently patient is slightly labile emotionally.  She is  tearful.  Pt denies emesis, fevers, chills, chest pains, shortness of breath, uti like symptoms.  She does have upper quadrant abdominal pain and headaches, but they are recurrent and chronic in nature.  Those symptoms are unchanged.  Exam does not show peritonitis.  No focal neurodeficits. I have reviewed previous encounters for this patient and reviewed their primary medications.  Differential diagnosis considered for this patient includes: Depression Severe stress Difficulty with coping Suicidal ideations Chronic peptic ulcer disease  Appropriate labs have been ordered. Patient is medically cleared for psychiatric evaluation.    Final Clinical Impression(s) / ED Diagnoses Final diagnoses:  Chronic abdominal pain  Suicidal ideations  Current severe episode of major depressive disorder without psychotic features, unspecified whether recurrent [facility])    Rx / DC Orders ED Discharge Orders     None         Derwood Kaplan, MD 12/22/22 1130

## 2022-12-22 NOTE — Telephone Encounter (Signed)
-----   Message from Willette Cluster sent at 12/21/2022  2:10 PM EDT ----- Gae Bon,  Will you please call patient and arrange for a gastric emptying study for persistent N/V? Thanks

## 2022-12-22 NOTE — Progress Notes (Signed)
Pt has been accepted to [facility] Unit TODAY 12/22/2022, pending EKG, covid, and voluntary consent. Bed assignment: 29  Pt meets inpatient criteria per Alona Bene, PMHNP  Attending Physician will be Elane Fritz, DO  Report can be called to: [phone]  Pt can arrive after pending items are received  Care Team Notified: Rona Ravens, RN, Caleen Essex, RN, Dorene Grebe, RN, and Percell Miller, RN  Cathie Beams, MSW, LCSW  12/22/2022 4:18 PM

## 2022-12-22 NOTE — Telephone Encounter (Signed)
Called patient to discuss the order for the gastric emptying study. Patient informed the nurse she is presently admitted in [facility]. Shall I keep order for future or d/C? Patient states she was admitted today.

## 2022-12-22 NOTE — Consult Note (Addendum)
BH ED ASSESSMENT   Reason for Consult: Psych Consult Referring Physician: Dr. Rhunette Croft Patient Identification: Natalie Edwards MRN:  [id number] ED Chief Complaint: MDD (major depressive disorder), recurrent severe, without psychosis (HCC)  Diagnosis:  Principal Problem:   MDD (major depressive disorder), recurrent severe, without psychosis (HCC) Active Problems:   GAD (generalized anxiety disorder)   Grief   ED Assessment Time Calculation: Start Time: 1200 Stop Time: 1240 Total Time in Minutes (Assessment Completion): 40   Subjective: Natalie Edwards is a 57 y.o. female patient comes in with chief complaint of suicidal ideation. Patient has history of chronic abdominal pain secondary to peptic ulcer disease, CKD, thrombocytopenia and chronic headaches.   HPI: Natalie Edwards, 57 y.o., female patient seen face to face by this provider, consulted with Dr. Octavia Bruckner; and chart reviewed on 12/22/22.  On evaluation Natalie Edwards reports that she is about to lose her house, states she does not have a job anymore and times have been so rough right now.  She states that she was fired from her job in July 2024 because she took some days off, and attempted to get disability for her depression and was denied, and when she came back to her job they fired her.  She states she has no family support she lives by herself, she states her mother used to be her biggest supporter, but her mother passed away in March 03, 2022. She states that she has 2 children, they are aware of her situation but she does not try and bother them too much as they live in [place]. She states she has not really dealt with her mother's passing, as it has been very hard on her.  She states that she is having suicidal thoughts "off and on "she says that sometimes she feels that it was better off if she was dead then being here on earth.  She denies HI/AVH.  She denies using any illicit drugs or alcohol, UDS is positive for  benzodiazepines, as patient takes a temazepam at bedtime for sleep.  She states she has been admitted to an inpatient psychiatric facility about 10 years ago for depression.  She states she currently takes Belsomra 20 mg at bedtime, Prozac 20 mg daily, Remeron 45 mg at bedtime, and Lyrica 75 mg twice daily.  She states her diagnosis is depression and anxiety.  She says she is compliant with her medications but does not feel they are working properly.  During evaluation Natalie Edwards is laying in bed in no acute distress. She is alert, oriented x 3, calm, cooperative and attentive. Her mood is depressed with congruent/ flat/ tearful affect. She has normal speech, and behavior.  Objectively there is no evidence of psychosis/mania or delusional thinking.  Patient is able to converse coherently, goal directed thoughts, no distractibility, or pre-occupation.  She denies self-harm/homicidal ideation, psychosis, and paranoia. She states that she is having suicidal thoughts "off and on "she says that sometimes she feels that it was better off if she was dead then being here on earth. Patient is a 69 -year-old female comes in with chief complaint of suicidal ideation. Patient has history of chronic abdominal pain secondary to peptic ulcer disease, CKD, thrombocytopenia and chronic headaches. She indicates that she also has depression. Last year she lost her mother. She has been struggling with the loss. More recently, she had to leave her job and her disability was declined. In the interim, her bills have been piling up, adding to her stress.  She feels helpless, hopeless and has had thoughts of hurting herself with overdosing on prescription pills. Patient has attempted to overdose in the past, that was 14 years ago. Patient states she currently sees [facility] at [facility] for therapy and Aundra Millet prescribes her medications.    Past Psychiatric History: anxiety and depression  Risk to Self or  Others: Risk to Self:  Yes  Risk to Others:  No   Prior Inpatient Therapy:  Yes  (10 yrs ago) Prior Outpatient Therapy: Yes    [place] Scale:  Flowsheet Row ED from 12/22/2022 in [facility] Emergency Department at [facility] ED from 10/24/2022 in [facility] Emergency Department at [facility] ED from 10/21/2022 in [facility] Emergency Department at [facility]  C-SSRS RISK CATEGORY High Risk No Risk No Risk       AIMS:  , , ,  ,   ASAM:    Substance Abuse:     Past Medical History:  Past Medical History:  Diagnosis Date   Allergy    Anemia    Diverticulosis    Gall bladder stones 1993   Gastric ulcer    GERD (gastroesophageal reflux disease)    Hypertension    Renal mass     Past Surgical History:  Procedure Laterality Date   ABDOMINAL HYSTERECTOMY     BIOPSY  09/29/2022   Procedure: BIOPSY;  Surgeon: Napoleon Form, MD;  Location: [facility] ENDOSCOPY;  Service: Gastroenterology;;   CHOLECYSTECTOMY     ESOPHAGOGASTRODUODENOSCOPY (EGD) WITH PROPOFOL N/A 09/29/2022   Procedure: ESOPHAGOGASTRODUODENOSCOPY (EGD) WITH PROPOFOL;  Surgeon: Napoleon Form, MD;  Location: [facility] ENDOSCOPY;  Service: Gastroenterology;  Laterality: N/A;   Family History:  Family History  Problem Relation Age of Onset   Cancer Maternal Uncle        Lung   Cancer Maternal Uncle        Lung   Cancer Maternal Uncle        Lung   Headache Neg Hx        "I don't think so"   Migraines Neg Hx        "I don't think so"    Social History:  Social History   Substance and Sexual Activity  Alcohol Use Yes   Comment: occasionally - last drink was July 4, 1 shot     Social History   Substance and Sexual Activity  Drug Use No    Social History   Socioeconomic History   Marital status: Single    Spouse name: Not on file   Number of children: 3   Years of education: Not on file   Highest education level: High school graduate  Occupational History   Occupation:  environmental services at [employer]  Tobacco Use   Smoking status: Every Day    Current packs/day: 0.50    Average packs/day: 0.5 packs/day for 25.0 years (12.5 ttl pk-yrs)    Types: Cigarettes   Smokeless tobacco: Never   Tobacco comments:    Pt tried to quit - helps with anxiety     1 pack last patient 3 days   Vaping Use   Vaping status: Never Used  Substance and Sexual Activity   Alcohol use: Yes    Comment: occasionally - last drink was July 4, 1 shot   Drug use: No   Sexual activity: Yes    Comment: hysterectomy  Other Topics Concern   Not on file  Social History Narrative  Lives at home in an apartment. Her mother lives with her.    Right handed   Caffeine: drinks approx. 36 oz of pepsi per day. Sometimes drinks 2 cups of coffee in a day as well.    Social Determinants of Health   Financial Resource Strain: High Risk (05/22/2022)   Overall Financial Resource Strain (CARDIA)    Difficulty of Paying Living Expenses: Very hard  Food Insecurity: No Food Insecurity (10/01/2022)   Hunger Vital Sign    Worried About Running Out of Food in the Last Year: Never true    Ran Out of Food in the Last Year: Never true  Transportation Needs: No Transportation Needs (10/01/2022)   PRAPARE - Administrator, Civil Service (Medical): No    Lack of Transportation (Non-Medical): No  Physical Activity: Sufficiently Active (05/22/2022)   Exercise Vital Sign    Days of Exercise per Week: 4 days    Minutes of Exercise per Session: 70 min  Stress: Stress Concern Present (05/22/2022)   [employer] of Occupational Health - Occupational Stress Questionnaire    Feeling of Stress : Very much  Social Connections: Moderately Isolated (05/22/2022)   Social Connection and Isolation Panel [NHANES]    Frequency of Communication with Friends and Family: More than three times a week    Frequency of Social Gatherings with Friends and Family: Never    Attends Religious Services: Never     Database administrator or Organizations: Yes    Attends Banker Meetings: Never    Marital Status: Never married      Allergies:   Allergies  Allergen Reactions   Hydroxyzine Other (See Comments)    Palpitations and jitteriness    Ambien [Zolpidem Tartrate] Other (See Comments)    Jitteriness, nervousness, abdominal pain    Labs:  Results for orders placed or performed during the hospital encounter of 12/22/22 (from the past 48 hour(s))  Comprehensive metabolic panel     Status: Abnormal   Collection Time: 12/22/22  9:19 AM  Result Value Ref Range   Sodium 137 135 - 145 mmol/L   Potassium 3.9 3.5 - 5.1 mmol/L   Chloride 106 98 - 111 mmol/L   CO2 17 (L) 22 - 32 mmol/L   Glucose, Bld 113 (H) 70 - 99 mg/dL    Comment: Glucose reference range applies only to samples taken after fasting for at least 8 hours.   BUN 11 6 - 20 mg/dL   Creatinine, Ser 1.91 (H) 0.44 - 1.00 mg/dL   Calcium 9.6 8.9 - 47.8 mg/dL   Total Protein 8.0 6.5 - 8.1 g/dL   Albumin 4.2 3.5 - 5.0 g/dL   AST 20 15 - 41 U/L   ALT 32 0 - 44 U/L   Alkaline Phosphatase 72 38 - 126 U/L   Total Bilirubin 0.4 0.3 - 1.2 mg/dL   GFR, Estimated 57 (L) >60 mL/min    Comment: (NOTE) Calculated using the CKD-EPI Creatinine Equation (2021)    Anion gap 14 5 - 15    Comment: Performed at [facility], [address], [place], [place] [address]  Ethanol     Status: None   Collection Time: 12/22/22  9:19 AM  Result Value Ref Range   Alcohol, Ethyl (B) <10 <10 mg/dL    Comment: (NOTE) Lowest detectable limit for serum alcohol is 10 mg/dL.  For medical purposes only. Performed at [facility], [address], [place],  [place] [address]   Salicylate level     Status: Abnormal   Collection Time: 12/22/22  9:19 AM  Result Value Ref Range   Salicylate Lvl <7.0 (L) 7.0 - 30.0 mg/dL    Comment: Performed at [facility], [address] [place], [place] [address]  Acetaminophen level      Status: Abnormal   Collection Time: 12/22/22  9:19 AM  Result Value Ref Range   Acetaminophen (Tylenol), Serum <10 (L) 10 - 30 ug/mL    Comment: (NOTE) Therapeutic concentrations vary significantly. A range of 10-30 ug/mL  may be an effective concentration for many patients. However, some  are best treated at concentrations outside of this range. Acetaminophen concentrations >150 ug/mL at 4 hours after ingestion  and >50 ug/mL at 12 hours after ingestion are often associated with  toxic reactions.  Performed at [facility], [address], [place], [place] [address]   cbc     Status: Abnormal   Collection Time: 12/22/22  9:19 AM  Result Value Ref Range   WBC 13.6 (H) 4.0 - 10.5 K/uL   RBC 4.82 3.87 - 5.11 MIL/uL   Hemoglobin 13.1 12.0 - 15.0 g/dL   HCT 91.4 78.2 - 95.6 %   MCV 85.3 80.0 - 100.0 fL   MCH 27.2 26.0 - 34.0 pg   MCHC 31.9 30.0 - 36.0 g/dL   RDW 21.3 (H) 08.6 - 57.8 %   Platelets 648 (H) 150 - 400 K/uL   nRBC 0.0 0.0 - 0.2 %    Comment: Performed at [facility], [address], [place], [place] [address]  Rapid urine drug screen (hospital performed)     Status: Abnormal   Collection Time: 12/22/22  9:19 AM  Result Value Ref Range   Opiates NONE DETECTED NONE DETECTED   Cocaine NONE DETECTED NONE DETECTED   Benzodiazepines POSITIVE (A) NONE DETECTED   Amphetamines NONE DETECTED NONE DETECTED   Tetrahydrocannabinol NONE DETECTED NONE DETECTED   Barbiturates NONE DETECTED NONE DETECTED    Comment: (NOTE) DRUG SCREEN FOR MEDICAL PURPOSES ONLY.  IF CONFIRMATION IS NEEDED FOR ANY PURPOSE, NOTIFY LAB WITHIN 5 DAYS.  LOWEST DETECTABLE LIMITS FOR URINE DRUG SCREEN Drug Class                     Cutoff (ng/mL) Amphetamine and metabolites    1000 Barbiturate and metabolites    200 Benzodiazepine                 200 Opiates and metabolites        300 Cocaine and metabolites        300 THC                            50 Performed at [facility]  Lab, [address], [place], [place] [address]   Basic metabolic panel     Status: Abnormal   Collection Time: 12/22/22 12:42 PM  Result Value Ref Range   Sodium 137 135 - 145 mmol/L   Potassium 4.0 3.5 - 5.1 mmol/L   Chloride 108 98 - 111 mmol/L   CO2 18 (L) 22 - 32 mmol/L   Glucose, Bld 89 70 - 99 mg/dL    Comment: Glucose reference range applies only to samples taken after fasting for at least 8 hours.   BUN 10 6 - 20 mg/dL   Creatinine, Ser 1.32  0.44 - 1.00 mg/dL   Calcium 9.5 8.9 - 13.2 mg/dL   GFR, Estimated >44 >01 mL/min    Comment: (NOTE) Calculated using the CKD-EPI Creatinine Equation (2021)    Anion gap 11 5 - 15    Comment: Performed at [facility], [address], [place], [place] [address]    Current Facility-Administered Medications  Medication Dose Route Frequency Provider Last Rate Last Admin   acetaminophen (TYLENOL) tablet 650 mg  650 mg Oral Q4H PRN Rhunette Croft, Ankit, MD       amLODipine (NORVASC) tablet 10 mg  10 mg Oral Daily Rhunette Croft, Ankit, MD   10 mg at 12/22/22 1431   And   benazepril (LOTENSIN) tablet 40 mg  40 mg Oral Daily Rhunette Croft, Ankit, MD   40 mg at 12/22/22 1431   dicyclomine (BENTYL) tablet 20 mg  20 mg Oral BID PRN Derwood Kaplan, MD       famotidine (PEPCID) tablet 20 mg  20 mg Oral QHS Nanavati, Ankit, MD       FLUoxetine (PROZAC) capsule 20 mg  20 mg Oral Daily Rhunette Croft, Ankit, MD   20 mg at 12/22/22 1430   hydrochlorothiazide (HYDRODIURIL) tablet 25 mg  25 mg Oral Daily Rhunette Croft, Ankit, MD   25 mg at 12/22/22 1430   loratadine (CLARITIN) tablet 10 mg  10 mg Oral Daily PRN Derwood Kaplan, MD       mirtazapine (REMERON) tablet 45 mg  45 mg Oral QHS Nanavati, Ankit, MD       ondansetron (ZOFRAN) tablet 4 mg  4 mg Oral Q8H PRN Nanavati, Ankit, MD       pantoprazole (PROTONIX) EC tablet 40 mg  40 mg Oral Daily Nanavati, Ankit, MD   40 mg at 12/22/22 1431   polyethylene glycol (MIRALAX / GLYCOLAX) packet 17 g  17 g Oral Daily PRN Rhunette Croft,  Ankit, MD       pregabalin (LYRICA) capsule 75 mg  75 mg Oral BID Rhunette Croft, Ankit, MD   75 mg at 12/22/22 1431   Suvorexant TABS 20 mg  1 tablet Oral QHS Nanavati, Ankit, MD       temazepam (RESTORIL) capsule 30 mg  30 mg Oral QHS PRN Derwood Kaplan, MD       Current Outpatient Medications  Medication Sig Dispense Refill   acetaminophen (TYLENOL) 325 MG tablet Take 2 tablets (650 mg total) by mouth every 6 (six) hours as needed for mild pain (or Fever >/= 101). 20 tablet 0   amLODipine-benazepril (LOTREL) 10-40 MG capsule Take 1 capsule by mouth daily. 90 capsule 1   BELSOMRA 20 MG TABS Take 1 tablet (20 mg total) by mouth at bedtime. 30 tablet 1   dicyclomine (BENTYL) 20 MG tablet Take 1 tablet (20 mg total) by mouth 2 (two) times daily as needed for spasms. 60 tablet 3   diphenhydramine-acetaminophen (TYLENOL PM) 25-500 MG TABS tablet Take 1 tablet by mouth at bedtime as needed (pain).     famotidine (PEPCID) 20 MG tablet Take 1 tablet (20 mg total) by mouth at bedtime.     FLUoxetine (PROZAC) 20 MG capsule Take 20 mg by mouth daily.     hydrochlorothiazide (HYDRODIURIL) 25 MG tablet Take 1 tablet (25 mg total) by mouth daily. 90 tablet 0   loratadine (CLARITIN) 10 MG tablet Take 1 tablet (10 mg total) by mouth daily as needed for allergies. 30 tablet 0   mirtazapine (REMERON) 45 MG tablet Take 1 tablet (45 mg  total) by mouth at bedtime. 90 tablet 1   ondansetron (ZOFRAN) 4 MG tablet Take 1 tablet (4 mg total) by mouth every 8 (eight) hours as needed for nausea. 40 tablet 3   pantoprazole (PROTONIX) 40 MG tablet Take 1 tablet (40 mg total) by mouth daily. Take 1 tablet 30 minutes prior to breakfast each day 30 tablet 5   polyethylene glycol (MIRALAX / GLYCOLAX) 17 g packet Take 17 g by mouth daily as needed for mild constipation. 14 each 0   pregabalin (LYRICA) 75 MG capsule Take 75 mg by mouth 2 (two) times daily.     temazepam (RESTORIL) 30 MG capsule Take 1 capsule (30 mg total) by mouth  at bedtime as needed. 30 capsule 3    Musculoskeletal: Strength & Muscle Tone: within normal limits Gait & Station: normal Patient leans: N/A   Psychiatric Specialty Exam: Presentation  General Appearance:  Appropriate for Environment  Eye Contact: Fleeting  Speech: Clear and Coherent  Speech Volume: Normal  Handedness: Right   Mood and Affect  Mood: Depressed; Hopeless  Affect: Appropriate; Tearful   Thought Process  Thought Processes: Coherent  Descriptions of Associations:Intact  Orientation:Full (Time, Place and Person)  Thought Content:Logical  History of Schizophrenia/Schizoaffective disorder:No data recorded Duration of Psychotic Symptoms:No data recorded Hallucinations:Hallucinations: None  Ideas of Reference:None  Suicidal Thoughts:Suicidal Thoughts: Yes, Passive  Homicidal Thoughts:Homicidal Thoughts: No   Sensorium  Memory: Immediate Good; Recent Good  Judgment: Poor  Insight: Fair   Chartered certified accountant: Fair  Attention Span: Fair  Recall: [employer] of Knowledge: Fair  Language: Fair   Psychomotor Activity  Psychomotor Activity: Psychomotor Activity: Normal   Assets  Assets: Manufacturing systems engineer; Desire for Improvement; Social Support    Sleep  Sleep: Sleep: Poor   Physical Exam: Physical Exam Vitals and nursing note reviewed. Exam conducted with a chaperone present.  Neurological:     Mental Status: She is alert.  Psychiatric:        Attention and Perception: Attention normal.        Mood and Affect: Mood is depressed. Affect is flat and tearful.        Speech: Speech normal.        Behavior: Behavior is cooperative.        Thought Content: Thought content includes suicidal ideation. Thought content includes suicidal plan.        Cognition and Memory: Memory normal.        Judgment: Judgment is inappropriate.    Review of Systems  Psychiatric/Behavioral:  Positive for  depression and suicidal ideas.    Blood pressure (!) 161/101, pulse (!) 114, temperature 98.1 F (36.7 C), temperature source Oral, resp. rate (!) 22, height 5\' 4"  (1.626 m), weight 78.5 kg, SpO2 99%. Body mass index is 29.71 kg/m.   Medical Decision Making: Patient case review and discussed with?Dr. Octavia Bruckner. Patient needs inpatient psychiatric admission for stabilization and treatment. [facility] gero-psych to review.   Disposition: Recommend psychiatric Inpatient admission.  Alona Bene, PMHNP 12/22/2022 3:13 PM

## 2022-12-22 NOTE — ED Notes (Addendum)
Pt changed into purple scrubs. Security called to wand pt.

## 2022-12-22 NOTE — Group Note (Unsigned)
Date:  12/23/2022 Time:  12:22 AM  Group Topic/Focus:  Wellness Toolbox:   The focus of this group is to discuss various aspects of wellness, balancing those aspects and exploring ways to increase the ability to experience wellness.  Patients will create a wellness toolbox for use upon discharge.    Participation Level:  Did Not Attend  Participation Quality:      Affect:      Cognitive:      Insight: None  Engagement in Group:  None  Modes of Intervention:  Discussion  Additional Comments:    Maeola Harman 12/23/2022, 12:22 AM

## 2022-12-23 ENCOUNTER — Inpatient Hospital Stay
Admission: AD | Admit: 2022-12-23 | Discharge: 2022-12-27 | DRG: 881 | Disposition: A | Discharge status: Home / Self Care | Payer: 59 | Source: Intra-hospital | Attending: Psychiatry | Admitting: Psychiatry

## 2022-12-23 ENCOUNTER — Encounter: Payer: Self-pay | Admitting: Psychiatric/Mental Health

## 2022-12-23 ENCOUNTER — Telehealth: Payer: Self-pay | Admitting: *Deleted

## 2022-12-23 DIAGNOSIS — Z9049 Acquired absence of other specified parts of digestive tract: Secondary | ICD-10-CM | POA: Diagnosis not present

## 2022-12-23 DIAGNOSIS — F17210 Nicotine dependence, cigarettes, uncomplicated: Secondary | ICD-10-CM | POA: Diagnosis present

## 2022-12-23 DIAGNOSIS — I10 Essential (primary) hypertension: Secondary | ICD-10-CM | POA: Diagnosis present

## 2022-12-23 DIAGNOSIS — Z23 Encounter for immunization: Secondary | ICD-10-CM

## 2022-12-23 DIAGNOSIS — D696 Thrombocytopenia, unspecified: Secondary | ICD-10-CM | POA: Diagnosis present

## 2022-12-23 DIAGNOSIS — F329 Major depressive disorder, single episode, unspecified: Secondary | ICD-10-CM | POA: Diagnosis present

## 2022-12-23 DIAGNOSIS — Z5986 Financial insecurity: Secondary | ICD-10-CM

## 2022-12-23 DIAGNOSIS — Z8711 Personal history of peptic ulcer disease: Secondary | ICD-10-CM

## 2022-12-23 DIAGNOSIS — K219 Gastro-esophageal reflux disease without esophagitis: Secondary | ICD-10-CM | POA: Diagnosis present

## 2022-12-23 DIAGNOSIS — Z638 Other specified problems related to primary support group: Secondary | ICD-10-CM | POA: Diagnosis not present

## 2022-12-23 DIAGNOSIS — G8929 Other chronic pain: Secondary | ICD-10-CM | POA: Diagnosis present

## 2022-12-23 DIAGNOSIS — F332 Major depressive disorder, recurrent severe without psychotic features: Secondary | ICD-10-CM | POA: Diagnosis not present

## 2022-12-23 DIAGNOSIS — Z634 Disappearance and death of family member: Secondary | ICD-10-CM | POA: Diagnosis not present

## 2022-12-23 DIAGNOSIS — Z560 Unemployment, unspecified: Secondary | ICD-10-CM

## 2022-12-23 DIAGNOSIS — Z79899 Other long term (current) drug therapy: Secondary | ICD-10-CM

## 2022-12-23 DIAGNOSIS — F419 Anxiety disorder, unspecified: Secondary | ICD-10-CM | POA: Diagnosis present

## 2022-12-23 DIAGNOSIS — R45851 Suicidal ideations: Secondary | ICD-10-CM | POA: Diagnosis present

## 2022-12-23 DIAGNOSIS — Z90710 Acquired absence of both cervix and uterus: Secondary | ICD-10-CM

## 2022-12-23 DIAGNOSIS — Z888 Allergy status to other drugs, medicaments and biological substances status: Secondary | ICD-10-CM | POA: Diagnosis not present

## 2022-12-23 MED ORDER — DIPHENHYDRAMINE HCL 50 MG/ML IJ SOLN: 50.0000 mg | Freq: Three times a day (TID) | INTRAMUSCULAR | Status: DC | PRN

## 2022-12-23 MED ORDER — POLYETHYLENE GLYCOL 3350 17 G PO PACK
17.0000 g | PACK | Freq: Every day | ORAL | Status: DC
  Administered 2022-12-23 – 2022-12-27 (×5): 17 g via ORAL
  Filled 2022-12-23 (×5): qty 1

## 2022-12-23 MED ORDER — LORAZEPAM 2 MG/ML IJ SOLN: 2.0000 mg | Freq: Three times a day (TID) | INTRAMUSCULAR | Status: DC | PRN

## 2022-12-23 MED ORDER — AMLODIPINE BESYLATE 5 MG PO TABS
5.0000 mg | ORAL_TABLET | Freq: Every day | ORAL | Status: DC
  Administered 2022-12-23 – 2022-12-27 (×5): 5 mg via ORAL
  Filled 2022-12-23 (×5): qty 1

## 2022-12-23 MED ORDER — FLUOXETINE HCL 20 MG PO CAPS
20.0000 mg | ORAL_CAPSULE | Freq: Every day | ORAL | Status: DC
  Administered 2022-12-23 – 2022-12-27 (×5): 20 mg via ORAL
  Filled 2022-12-23 (×5): qty 1

## 2022-12-23 MED ORDER — LORAZEPAM 1 MG PO TABS: 1.0000 mg | ORAL_TABLET | ORAL | Status: DC | PRN

## 2022-12-23 MED ORDER — OLANZAPINE 5 MG PO TABS
10.0000 mg | ORAL_TABLET | Freq: Four times a day (QID) | ORAL | Status: DC | PRN
  Administered 2022-12-23: 10 mg via ORAL
  Filled 2022-12-23: qty 2

## 2022-12-23 MED ORDER — DIPHENHYDRAMINE HCL 25 MG PO CAPS: 25.0000 mg | ORAL_CAPSULE | Freq: Four times a day (QID) | ORAL | Status: DC | PRN

## 2022-12-23 MED ORDER — HALOPERIDOL LACTATE 5 MG/ML IJ SOLN: 5.0000 mg | Freq: Three times a day (TID) | INTRAMUSCULAR | Status: DC | PRN

## 2022-12-23 MED ORDER — DIPHENHYDRAMINE HCL 25 MG PO CAPS: 50.0000 mg | ORAL_CAPSULE | Freq: Three times a day (TID) | ORAL | Status: DC | PRN

## 2022-12-23 MED ORDER — RISPERIDONE 1 MG PO TABS
0.5000 mg | ORAL_TABLET | ORAL | Status: DC
  Administered 2022-12-23 – 2022-12-27 (×9): 0.5 mg via ORAL
  Filled 2022-12-23 (×9): qty 1

## 2022-12-23 MED ORDER — MAGNESIUM HYDROXIDE 400 MG/5ML PO SUSP: 30.0000 mL | Freq: Every day | ORAL | Status: DC | PRN

## 2022-12-23 MED ORDER — LORAZEPAM 1 MG PO TABS: 2.0000 mg | ORAL_TABLET | Freq: Three times a day (TID) | ORAL | Status: DC | PRN

## 2022-12-23 MED ORDER — HALOPERIDOL 5 MG PO TABS: 5.0000 mg | ORAL_TABLET | Freq: Three times a day (TID) | ORAL | Status: DC | PRN

## 2022-12-23 MED ORDER — PANTOPRAZOLE SODIUM 40 MG PO TBEC
40.0000 mg | DELAYED_RELEASE_TABLET | Freq: Every day | ORAL | Status: DC
  Administered 2022-12-23 – 2022-12-27 (×5): 40 mg via ORAL
  Filled 2022-12-23 (×5): qty 1

## 2022-12-23 MED ORDER — TRAZODONE HCL 50 MG PO TABS
50.0000 mg | ORAL_TABLET | Freq: Every evening | ORAL | Status: DC | PRN
  Administered 2022-12-23 (×2): 50 mg via ORAL
  Filled 2022-12-23 (×2): qty 1

## 2022-12-23 MED ORDER — PREGABALIN 75 MG PO CAPS
75.0000 mg | ORAL_CAPSULE | Freq: Two times a day (BID) | ORAL | Status: DC
  Administered 2022-12-23 – 2022-12-27 (×9): 75 mg via ORAL
  Filled 2022-12-23 (×9): qty 1

## 2022-12-23 MED ORDER — TEMAZEPAM 7.5 MG PO CAPS: 15.0000 mg | ORAL_CAPSULE | Freq: Every evening | ORAL | Status: DC | PRN

## 2022-12-23 MED ORDER — ACETAMINOPHEN 325 MG PO TABS
650.0000 mg | ORAL_TABLET | Freq: Four times a day (QID) | ORAL | Status: DC | PRN
  Administered 2022-12-25: 650 mg via ORAL
  Filled 2022-12-23: qty 2

## 2022-12-23 MED ORDER — MIRTAZAPINE 15 MG PO TABS
15.0000 mg | ORAL_TABLET | Freq: Every day | ORAL | Status: DC
  Administered 2022-12-23 – 2022-12-26 (×4): 15 mg via ORAL
  Filled 2022-12-23 (×3): qty 1

## 2022-12-23 MED ORDER — ALUM & MAG HYDROXIDE-SIMETH 200-200-20 MG/5ML PO SUSP: 30.0000 mL | ORAL | Status: DC | PRN

## 2022-12-23 NOTE — Telephone Encounter (Signed)
-----   Message from Willette Cluster sent at 12/21/2022  2:10 PM EDT ----- Natalie Edwards,  Will you please call patient and arrange for a gastric emptying study for persistent N/V? Thanks

## 2022-12-23 NOTE — Group Note (Signed)
Date:  12/23/2022 Time:  11:18 PM  Group Topic/Focus:  Personal Choices and Values:   The focus of this group is to help patients assess and explore the importance of values in their lives, how their values affect their decisions, how they express their values and what opposes their expression.    Participation Level:  Active  Participation Quality:  Appropriate  Affect:  Appropriate  Cognitive:  Appropriate  Insight: Appropriate  Engagement in Group:  Engaged  Modes of Intervention:  Discussion  Additional Comments:    Maeola Harman 12/23/2022, 11:18 PM

## 2022-12-23 NOTE — Plan of Care (Signed)
  Problem: Education: Goal: Knowledge of General Education information will improve Description: Including pain rating scale, medication(s)/side effects and non-pharmacologic comfort measures Outcome: Not Met (add Reason)   Problem: Health Behavior/Discharge Planning: Goal: Ability to manage health-related needs will improve Outcome: Not Met (add Reason)   Problem: Clinical Measurements: Goal: Ability to maintain clinical measurements within normal limits will improve Outcome: Not Met (add Reason) Goal: Will remain free from infection Outcome: Not Met (add Reason) Goal: Diagnostic test results will improve Outcome: Not Met (add Reason) Goal: Respiratory complications will improve Outcome: Not Met (add Reason) Goal: Cardiovascular complication will be avoided Outcome: Not Met (add Reason)   Problem: Activity: Goal: Risk for activity intolerance will decrease Outcome: Not Met (add Reason)   Problem: Nutrition: Goal: Adequate nutrition will be maintained Outcome: Not Met (add Reason)   Problem: Coping: Goal: Level of anxiety will decrease Outcome: Not Met (add Reason)   Problem: Elimination: Goal: Will not experience complications related to bowel motility Outcome: Not Met (add Reason) Goal: Will not experience complications related to urinary retention Outcome: Not Met (add Reason)   Problem: Pain Managment: Goal: General experience of comfort will improve Outcome: Not Met (add Reason)   Problem: Safety: Goal: Ability to remain free from injury will improve Outcome: Not Met (add Reason)   Problem: Skin Integrity: Goal: Risk for impaired skin integrity will decrease Outcome: Not Met (add Reason)   Problem: Education: Goal: Utilization of techniques to improve thought processes will improve Outcome: Not Met (add Reason) Goal: Knowledge of the prescribed therapeutic regimen will improve Outcome: Not Met (add Reason)   Problem: Activity: Goal: Interest or  engagement in leisure activities will improve Outcome: Not Met (add Reason) Goal: Imbalance in normal sleep/wake cycle will improve Outcome: Not Met (add Reason)   Problem: Coping: Goal: Coping ability will improve Outcome: Not Met (add Reason) Goal: Will verbalize feelings Outcome: Not Met (add Reason)   Problem: Health Behavior/Discharge Planning: Goal: Ability to make decisions will improve Outcome: Not Met (add Reason) Goal: Compliance with therapeutic regimen will improve Outcome: Not Met (add Reason)   Problem: Role Relationship: Goal: Will demonstrate positive changes in social behaviors and relationships Outcome: Not Met (add Reason)   Problem: Safety: Goal: Ability to disclose and discuss suicidal ideas will improve Outcome: Not Met (add Reason) Goal: Ability to identify and utilize support systems that promote safety will improve Outcome: Not Met (add Reason)   Problem: Self-Concept: Goal: Will verbalize positive feelings about self Outcome: Not Met (add Reason) Goal: Level of anxiety will decrease Outcome: Not Met (add Reason)

## 2022-12-23 NOTE — Progress Notes (Signed)
   12/23/22 1400  Psych Admission Type (Psych Patients Only)  Admission Status Voluntary  Psychosocial Assessment  Patient Complaints Anxiety;Depression  Eye Contact Brief  Facial Expression Anxious  Affect Flat  Speech Soft  Interaction Assertive  Motor Activity Slow  Appearance/Hygiene In scrubs  Behavior Characteristics Cooperative  Mood Anxious  Thought Process  Coherency WDL  Content WDL  Delusions None reported or observed  Perception WDL  Hallucination None reported or observed  Judgment WDL  Confusion None  Danger to Self  Current suicidal ideation? Denies  Danger to Others  Danger to Others None reported or observed

## 2022-12-23 NOTE — H&P (Addendum)
Psychiatric Admission Assessment Adult  Patient Identification: Natalie Edwards MRN:  [id number] Date of Evaluation:  12/23/2022 Chief Complaint:  MDD (major depressive disorder) [F32.9] Principal Diagnosis: MDD (major depressive disorder) Diagnosis:  Principal Problem:   MDD (major depressive disorder)  History of Present Illness:  Natalie Edwards is a 57 y.o. female patient comes in with chief complaint of suicidal ideation. Natalie Edwards informs me in a tearful manner, that she is depressed because her mom recently passed away and her grown children live up [place] but will not have her come stay with them.  She recently lost her job and was denied disability.  She does have a Therapist, sports and a therapist in [place].  She is currently on Restoril, Remeron and says that her medications are not helping.  She has been suicidal recently but currently denies being suicidal.  She denies any auditory or visual hallucinations.  Patient has history of chronic abdominal pain secondary to peptic ulcer disease, CKD, thrombocytopenia and chronic headaches. On evaluation Natalie Edwards reports that she is about to lose her house, states she does not have a job anymore and times have been so rough right now.  She states that she was fired from her job in July 2024 because she took some days off, and attempted to get disability for her depression and was denied, and when she came back to her job they fired her.  She states she has no family support she lives by herself, she states her mother used to be her biggest supporter, but her mother passed away in 2022-02-23. She states that she has 2 children, they are aware of her situation but she does not try and bother them too much as they live in [place] and [place]. She states she has not really dealt with her mother's passing, as it has been very hard on her.  She states that she is having suicidal thoughts "off and on "she says that sometimes she feels that it was better  off if she was dead then being here on earth.  She denies HI/AVH.  She denies using any illicit drugs or alcohol, UDS is positive for benzodiazepines, as patient takes a temazepam at bedtime for sleep.  She states she has been admitted to an inpatient psychiatric facility about 10 years ago for depression.  She states she currently takes Belsomra 20 mg at bedtime, Prozac 20 mg daily, Remeron 45 mg at bedtime, and Lyrica 75 mg twice daily.  She states her diagnosis is depression and anxiety.  She says she is compliant with her medications but does not feel they are working properly.   During evaluation Natalie Edwards is laying in bed in no acute distress. She is alert, oriented x 3, calm, cooperative and attentive. Her mood is depressed with congruent/ flat/ tearful affect. She has normal speech, and behavior.  Objectively there is no evidence of psychosis/mania or delusional thinking.  Patient is able to converse coherently, goal directed thoughts, no distractibility, or pre-occupation.  She denies self-harm/homicidal ideation, psychosis, and paranoia. She states that she is having suicidal thoughts "off and on "she says that sometimes she feels that it was better off if she was dead then being here on earth. Patient is a 57 -year-old female comes in with chief complaint of suicidal ideation. Patient has history of chronic abdominal pain secondary to peptic ulcer disease, CKD, thrombocytopenia and chronic headaches. She indicates that she also has depression. Last year she lost her mother. She has been struggling  with the loss. More recently, she had to leave her job and her disability was declined. In the interim, her bills have been piling up, adding to her stress. She feels helpless, hopeless and has had thoughts of hurting herself with overdosing on prescription pills. Patient has attempted to overdose in the past, that was 14 years ago. Patient states she currently sees [facility] at [facility] for therapy and Aundra Millet prescribes her medications.   Associated Signs/Symptoms: Depression Symptoms:  depressed mood, anhedonia, anxiety, (Hypo) Manic Symptoms:  Labiality of Mood, Anxiety Symptoms:  Excessive Worry, Psychotic Symptoms:   None PTSD Symptoms: NA Total Time spent with patient: 1 hour  Past Psychiatric History: Depression and anxiety  Is the patient at risk to self? Yes.    Has the patient been a risk to self in the past 6 months? Yes.    Has the patient been a risk to self within the distant past? Yes.    Is the patient a risk to others? No.  Has the patient been a risk to others in the past 6 months? No.  Has the patient been a risk to others within the distant past? No.   [place] Scale:  Flowsheet Row Admission (Current) from 12/23/2022 in [facility] BE[facility]AL [facility] ED from 12/22/2022 in [facility] Emergency Department at [facility] ED from 10/24/2022 in [facility] Emergency Department at [facility]  C-SSRS RISK CATEGORY No Risk High Risk No Risk        Prior Inpatient Therapy: No. If yes, describe  Prior Outpatient Therapy: Yes.   If yes, describe as above  Alcohol Screening: Patient refused Alcohol Screening Tool: Yes 1. How often do you have a drink containing alcohol?: Never 2. How many drinks containing alcohol do you have on a typical day when you are drinking?: 1 or 2 3. How often do you have six or more drinks on one occasion?: Never AUDIT-C Score: 0 Alcohol Brief Interventions/Follow-up: (S) Alcohol education/Brief advice Substance Abuse History in the last 12 months:  No. Consequences of Substance Abuse: NA Previous Psychotropic Medications: Yes  Psychological Evaluations: Yes  Past Medical History:  Past Medical History:  Diagnosis Date   Allergy    Anemia    Diverticulosis    Gall bladder stones 1993   Gastric ulcer    GERD (gastroesophageal reflux disease)    Hypertension    Renal mass      Past Surgical History:  Procedure Laterality Date   ABDOMINAL HYSTERECTOMY     BIOPSY  09/29/2022   Procedure: BIOPSY;  Surgeon: Napoleon Form, MD;  Location: [facility] ENDOSCOPY;  Service: Gastroenterology;;   CHOLECYSTECTOMY     ESOPHAGOGASTRODUODENOSCOPY (EGD) WITH PROPOFOL N/A 09/29/2022   Procedure: ESOPHAGOGASTRODUODENOSCOPY (EGD) WITH PROPOFOL;  Surgeon: Napoleon Form, MD;  Location: [facility] ENDOSCOPY;  Service: Gastroenterology;  Laterality: N/A;   Family History:  Family History  Problem Relation Age of Onset   Cancer Maternal Uncle        Lung   Cancer Maternal Uncle        Lung   Cancer Maternal Uncle        Lung   Headache Neg Hx        "I don't think so"   Migraines Neg Hx        "I don't think so"   Family Psychiatric  History: Unremarkable Tobacco Screening:  Social History   Tobacco Use  Smoking Status Every Day   Current  packs/day: 0.50   Average packs/day: 0.5 packs/day for 25.0 years (12.5 ttl pk-yrs)   Types: Cigarettes  Smokeless Tobacco Never  Tobacco Comments   Pt tried to quit - helps with anxiety    1 pack last patient 3 days     BH Tobacco Counseling     Are you interested in Tobacco Cessation Medications?  No value filed. Counseled patient on smoking cessation:  No value filed. Reason Tobacco Screening Not Completed: No value filed.       Social History:  Social History   Substance and Sexual Activity  Alcohol Use Yes   Comment: occasionally - last drink was July 4, 1 shot     Social History   Substance and Sexual Activity  Drug Use No    Additional Social History:                           Allergies:   Allergies  Allergen Reactions   Hydroxyzine Other (See Comments)    Palpitations and jitteriness    Ambien [Zolpidem Tartrate] Other (See Comments)    Jitteriness, nervousness, abdominal pain   Lab Results:  Results for orders placed or performed during the hospital encounter of 12/22/22 (from the past 48  hour(s))  Comprehensive metabolic panel     Status: Abnormal   Collection Time: 12/22/22  9:19 AM  Result Value Ref Range   Sodium 137 135 - 145 mmol/L   Potassium 3.9 3.5 - 5.1 mmol/L   Chloride 106 98 - 111 mmol/L   CO2 17 (L) 22 - 32 mmol/L   Glucose, Bld 113 (H) 70 - 99 mg/dL    Comment: Glucose reference range applies only to samples taken after fasting for at least 8 hours.   BUN 11 6 - 20 mg/dL   Creatinine, Ser 1.61 (H) 0.44 - 1.00 mg/dL   Calcium 9.6 8.9 - 09.6 mg/dL   Total Protein 8.0 6.5 - 8.1 g/dL   Albumin 4.2 3.5 - 5.0 g/dL   AST 20 15 - 41 U/L   ALT 32 0 - 44 U/L   Alkaline Phosphatase 72 38 - 126 U/L   Total Bilirubin 0.4 0.3 - 1.2 mg/dL   GFR, Estimated 57 (L) >60 mL/min    Comment: (NOTE) Calculated using the CKD-EPI Creatinine Equation (2021)    Anion gap 14 5 - 15    Comment: Performed at [facility], [address] [place], [place] [address]  Ethanol     Status: None   Collection Time: 12/22/22  9:19 AM  Result Value Ref Range   Alcohol, Ethyl (B) <10 <10 mg/dL    Comment: (NOTE) Lowest detectable limit for serum alcohol is 10 mg/dL.  For medical purposes only. Performed at [facility], [address], [place], [place] [address]   Salicylate level     Status: Abnormal   Collection Time: 12/22/22  9:19 AM  Result Value Ref Range   Salicylate Lvl <7.0 (L) 7.0 - 30.0 mg/dL    Comment: Performed at [facility], [address], [place], [place] [address]  Acetaminophen level     Status: Abnormal   Collection Time: 12/22/22  9:19 AM  Result Value Ref Range   Acetaminophen (Tylenol), Serum <10 (L) 10 - 30 ug/mL    Comment: (NOTE) Therapeutic concentrations vary significantly. A range of 10-30 ug/mL  may be an effective concentration for many patients. However, some  are best treated at concentrations outside of this range. Acetaminophen concentrations >150 ug/mL at 4 hours after ingestion  and >50 ug/mL at 12 hours after ingestion  are often associated with  toxic reactions.  Performed at [facility], [address], [place], [place] [address]   cbc     Status: Abnormal   Collection Time: 12/22/22  9:19 AM  Result Value Ref Range   WBC 13.6 (H) 4.0 - 10.5 K/uL   RBC 4.82 3.87 - 5.11 MIL/uL   Hemoglobin 13.1 12.0 - 15.0 g/dL   HCT 64.4 03.4 - 74.2 %   MCV 85.3 80.0 - 100.0 fL   MCH 27.2 26.0 - 34.0 pg   MCHC 31.9 30.0 - 36.0 g/dL   RDW 59.5 (H) 63.8 - 75.6 %   Platelets 648 (H) 150 - 400 K/uL   nRBC 0.0 0.0 - 0.2 %    Comment: Performed at [facility] Lab, [address], [place], [place] [address]  Rapid urine drug screen (hospital performed)     Status: Abnormal   Collection Time: 12/22/22  9:19 AM  Result Value Ref Range   Opiates NONE DETECTED NONE DETECTED   Cocaine NONE DETECTED NONE DETECTED   Benzodiazepines POSITIVE (A) NONE DETECTED   Amphetamines NONE DETECTED NONE DETECTED   Tetrahydrocannabinol NONE DETECTED NONE DETECTED   Barbiturates NONE DETECTED NONE DETECTED    Comment: (NOTE) DRUG SCREEN FOR MEDICAL PURPOSES ONLY.  IF CONFIRMATION IS NEEDED FOR ANY PURPOSE, NOTIFY LAB WITHIN 5 DAYS.  LOWEST DETECTABLE LIMITS FOR URINE DRUG SCREEN Drug Class                     Cutoff (ng/mL) Amphetamine and metabolites    1000 Barbiturate and metabolites    200 Benzodiazepine                 200 Opiates and metabolites        300 Cocaine and metabolites        300 THC                            50 Performed at [facility], [address] [place], [place] [address]   Basic metabolic panel     Status: Abnormal   Collection Time: 12/22/22 12:42 PM  Result Value Ref Range   Sodium 137 135 - 145 mmol/L   Potassium 4.0 3.5 - 5.1 mmol/L   Chloride 108 98 - 111 mmol/L   CO2 18 (L) 22 - 32 mmol/L   Glucose, Bld 89 70 - 99 mg/dL    Comment: Glucose reference range applies only to samples taken after fasting for at least 8 hours.   BUN 10 6 - 20 mg/dL   Creatinine, Ser 1.66 0.44  - 1.00 mg/dL   Calcium 9.5 8.9 - 06.3 mg/dL   GFR, Estimated >01 >60 mL/min    Comment: (NOTE) Calculated using the CKD-EPI Creatinine Equation (2021)    Anion gap 11 5 - 15    Comment: Performed at [facility], [address] [place], [place] [address]  SARS Coronavirus 2 by RT PCR (hospital order, performed in [facility] hospital lab) *cepheid single result test* Anterior Nasal Swab     Status: None   Collection Time: 12/22/22  3:59 PM   Specimen: Anterior Nasal Swab  Result Value Ref Range   SARS Coronavirus 2 by RT  PCR NEGATIVE NEGATIVE    Comment: Performed at [facility], [address] [place], [place] [address]    Blood Alcohol level:  Lab Results  Component Value Date   ETH <10 12/22/2022    Metabolic Disorder Labs:  Lab Results  Component Value Date   HGBA1C 5.7 (H) 12/13/2021   No results found for: "PROLACTIN" Lab Results  Component Value Date   CHOL 202 (H) 12/13/2021   TRIG 113 12/13/2021   HDL 76 12/13/2021   CHOLHDL 2.7 12/13/2021   LDLCALC 106 (H) 12/13/2021   LDLCALC 104 (H) 05/31/2018    Current Medications: Current Facility-Administered Medications  Medication Dose Route Frequency Provider Last Rate Last Admin   acetaminophen (TYLENOL) tablet 650 mg  650 mg Oral Q6H PRN Jearld Lesch, NP       alum & mag hydroxide-simeth (MAALOX/MYLANTA) 200-200-20 MG/5ML suspension 30 mL  30 mL Oral Q4H PRN Dixon, Rashaun M, NP       diphenhydrAMINE (BENADRYL) capsule 50 mg  50 mg Oral TID PRN Jearld Lesch, NP       Or   diphenhydrAMINE (BENADRYL) injection 50 mg  50 mg Intramuscular TID PRN Jearld Lesch, NP       haloperidol (HALDOL) tablet 5 mg  5 mg Oral TID PRN Jearld Lesch, NP       Or   haloperidol lactate (HALDOL) injection 5 mg  5 mg Intramuscular TID PRN Durwin Nora, Rashaun M, NP       LORazepam (ATIVAN) tablet 2 mg  2 mg Oral TID PRN Jearld Lesch, NP       Or   LORazepam (ATIVAN) injection 2 mg  2 mg Intramuscular TID PRN  Jearld Lesch, NP       magnesium hydroxide (MILK OF MAGNESIA) suspension 30 mL  30 mL Oral Daily PRN Jearld Lesch, NP       traZODone (DESYREL) tablet 50 mg  50 mg Oral QHS PRN Jearld Lesch, NP   50 mg at 12/23/22 0245   PTA Medications: Medications Prior to Admission  Medication Sig Dispense Refill Last Dose   acetaminophen (TYLENOL) 325 MG tablet Take 2 tablets (650 mg total) by mouth every 6 (six) hours as needed for mild pain (or Fever >/= 101). 20 tablet 0    amLODipine-benazepril (LOTREL) 10-40 MG capsule Take 1 capsule by mouth daily. 90 capsule 1    BELSOMRA 20 MG TABS Take 1 tablet (20 mg total) by mouth at bedtime. 30 tablet 1    dicyclomine (BENTYL) 20 MG tablet Take 1 tablet (20 mg total) by mouth 2 (two) times daily as needed for spasms. 60 tablet 3    diphenhydramine-acetaminophen (TYLENOL PM) 25-500 MG TABS tablet Take 1 tablet by mouth at bedtime as needed (pain).      hydrOXYzine (ATARAX) 50 MG tablet Take 50 mg by mouth 3 (three) times daily as needed.      loratadine (CLARITIN) 10 MG tablet Take 1 tablet (10 mg total) by mouth daily as needed for allergies. 30 tablet 0    mirtazapine (REMERON) 45 MG tablet Take 1 tablet (45 mg total) by mouth at bedtime. 90 tablet 1    ondansetron (ZOFRAN) 4 MG tablet Take 1 tablet (4 mg total) by mouth every 8 (eight) hours as needed for nausea. 40 tablet 3    pantoprazole (PROTONIX) 40 MG tablet Take 1 tablet (40 mg total) by mouth daily. Take 1 tablet 30 minutes  prior to breakfast each day 30 tablet 5    polyethylene glycol (MIRALAX / GLYCOLAX) 17 g packet Take 17 g by mouth daily as needed for mild constipation. 14 each 0    pregabalin (LYRICA) 75 MG capsule Take 75 mg by mouth 2 (two) times daily.      temazepam (RESTORIL) 30 MG capsule Take 1 capsule (30 mg total) by mouth at bedtime as needed. 30 capsule 3     Musculoskeletal: Strength & Muscle Tone: within normal limits Gait & Station: normal Patient leans:  N/A            Psychiatric Specialty Exam:  Presentation  General Appearance:  Appropriate for Environment  Eye Contact: Fleeting  Speech: Clear and Coherent  Speech Volume: Normal  Handedness: Right   Mood and Affect  Mood: Depressed; Hopeless  Affect: Appropriate; Tearful   Thought Process  Thought Processes: Coherent  Duration of Psychotic Symptoms:N/A Past Diagnosis of Schizophrenia or Psychoactive disorder: No data recorded Descriptions of Associations:Intact  Orientation:Full (Time, Place and Person)  Thought Content:Logical  Hallucinations:Hallucinations: None  Ideas of Reference:None  Suicidal Thoughts:Suicidal Thoughts: Yes, Passive  Homicidal Thoughts:Homicidal Thoughts: No   Sensorium  Memory: Immediate Good; Recent Good  Judgment: Poor  Insight: Fair   Chartered certified accountant: Fair  Attention Span: Fair  Recall: [employer] of Knowledge: Fair  Language: Fair   Psychomotor Activity  Psychomotor Activity: Psychomotor Activity: Normal   Assets  Assets: Manufacturing systems engineer; Desire for Improvement; Social Support   Sleep  Sleep: Sleep: Poor    Physical Exam: Physical Exam Constitutional:      Appearance: Normal appearance.  HENT:     Head: Normocephalic and atraumatic.     Mouth/Throat:     Pharynx: Oropharynx is clear.  Eyes:     Pupils: Pupils are equal, round, and reactive to light.  Cardiovascular:     Rate and Rhythm: Normal rate and regular rhythm.  Pulmonary:     Effort: Pulmonary effort is normal.     Breath sounds: Normal breath sounds.  Abdominal:     General: Abdomen is flat.     Palpations: Abdomen is soft.  Musculoskeletal:        General: Normal range of motion.  Skin:    General: Skin is warm and dry.  Neurological:     General: No focal deficit present.     Mental Status: She is alert. Mental status is at baseline.  Psychiatric:        Attention and  Perception: Attention and perception normal.        Mood and Affect: Mood is anxious and depressed. Affect is flat and tearful.        Speech: Speech normal.        Behavior: Behavior is cooperative.        Thought Content: Thought content includes suicidal ideation.        Cognition and Memory: Cognition and memory normal.        Judgment: Judgment normal.    Review of Systems  Constitutional: Negative.   HENT: Negative.    Eyes: Negative.   Respiratory: Negative.    Cardiovascular: Negative.   Gastrointestinal: Negative.   Genitourinary: Negative.   Musculoskeletal: Negative.   Skin: Negative.   Neurological: Negative.   Endo/Heme/Allergies: Negative.   Psychiatric/Behavioral:  Positive for depression.    Blood pressure 134/73, pulse 87, temperature 98.1 F (36.7 C), resp. rate 18, height 5\' 3"  (1.6 m), weight 77.8 kg,  SpO2 99%. Body mass index is 30.38 kg/m.  Treatment Plan Summary: Daily contact with patient to assess and evaluate symptoms and progress in treatment, Medication management, and Plan see orders  Observation Level/Precautions:  15 minute checks  Laboratory:  CBC Chemistry Profile  Psychotherapy:    Medications:    Consultations:    Discharge Concerns:    Estimated LOS:  Other:     Physician Treatment Plan for Primary Diagnosis: MDD (major depressive disorder) Long Term Goal(s): Improvement in symptoms so as ready for discharge  Short Term Goals: Ability to identify changes in lifestyle to reduce recurrence of condition will improve, Ability to verbalize feelings will improve, Ability to disclose and discuss suicidal ideas, Ability to demonstrate self-control will improve, Ability to identify and develop effective coping behaviors will improve, Ability to maintain clinical measurements within normal limits will improve, Compliance with prescribed medications will improve, and Ability to identify triggers associated with substance abuse/mental health issues  will improve  Physician Treatment Plan for Secondary Diagnosis: Principal Problem:   MDD (major depressive disorder)     I certify that inpatient services furnished can reasonably be expected to improve the patient's condition.    Sarina Ill, DO 10/8/202411:03 AM

## 2022-12-23 NOTE — [facility] Suicide Risk Assessment (Addendum)
[facility] Admission Suicide Risk Assessment   Nursing information obtained from:  Patient Demographic factors:  NA Current Mental Status:  NA Loss Factors:  Loss of significant relationship, Financial problems / change in socioeconomic status Historical Factors:  Anniversary of important loss Risk Reduction Factors:  Sense of responsibility to family  Total Time spent with patient: 1 hour Principal Problem: MDD (major depressive disorder) Diagnosis:  Principal Problem:   MDD (major depressive disorder)  Subjective Data: Natalie Edwards is a 57 y.o. female patient comes in with chief complaint of suicidal ideation. Natalie Edwards informs me in a tearful manner, that she is depressed because her mom recently passed away and her grown children live up [place] but will not have her come stay with them.  She recently lost her job and was denied disability.  She does have a Therapist, sports and a therapist in [place].  She is currently on Restoril, Remeron and says that her medications are not helping.  She has been suicidal recently but currently denies being suicidal.  She denies any auditory or visual hallucinations.   Patient has history of chronic abdominal pain secondary to peptic ulcer disease, CKD, thrombocytopenia and chronic headaches. On evaluation Natalie Edwards reports that she is about to lose her house, states she does not have a job anymore and times have been so rough right now.  She states that she was fired from her job in July 2024 because she took some days off, and attempted to get disability for her depression and was denied, and when she came back to her job they fired her.  She states she has no family support she lives by herself, she states her mother used to be her biggest supporter, but her mother passed away in 02/04/2022. She states that she has 2 children, they are aware of her situation but she does not try and bother them too much as they live in [place]. She states she  has not really dealt with her mother's passing, as it has been very hard on her.  She states that she is having suicidal thoughts "off and on "she says that sometimes she feels that it was better off if she was dead then being here on earth.  She denies HI/AVH.  She denies using any illicit drugs or alcohol, UDS is positive for benzodiazepines, as patient takes a temazepam at bedtime for sleep.  She states she has been admitted to an inpatient psychiatric facility about 10 years ago for depression.  She states she currently takes Belsomra 20 mg at bedtime, Prozac 20 mg daily, Remeron 45 mg at bedtime, and Lyrica 75 mg twice daily.  She states her diagnosis is depression and anxiety.  She says she is compliant with her medications but does not feel they are working properly.   During evaluation Natalie Edwards is laying in bed in no acute distress. She is alert, oriented x 3, calm, cooperative and attentive. Her mood is depressed with congruent/ flat/ tearful affect. She has normal speech, and behavior.  Objectively there is no evidence of psychosis/mania or delusional thinking.  Patient is able to converse coherently, goal directed thoughts, no distractibility, or pre-occupation.  She denies self-harm/homicidal ideation, psychosis, and paranoia. She states that she is having suicidal thoughts "off and on "she says that sometimes she feels that it was better off if she was dead then being here on earth. Patient is a 57 -year-old female comes in with chief complaint of suicidal ideation. Patient  has history of chronic abdominal pain secondary to peptic ulcer disease, CKD, thrombocytopenia and chronic headaches. She indicates that she also has depression. Last year she lost her mother. She has been struggling with the loss. More recently, she had to leave her job and her disability was declined. In the interim, her bills have been piling up, adding to her stress. She feels helpless, hopeless and has had thoughts of  hurting herself with overdosing on prescription pills. Patient has attempted to overdose in the past, that was 14 years ago. Patient states she currently sees [facility] for therapy and Aundra Millet prescribes her medications.   Continued Clinical Symptoms:    The "Alcohol Use Disorders Identification Test", Guidelines for Use in Primary Care, Second Edition.  World Science writer [facility]). Score between 0-7:  no or low risk or alcohol related problems. Score between 8-15:  moderate risk of alcohol related problems. Score between 16-19:  high risk of alcohol related problems. Score 20 or above:  warrants further diagnostic evaluation for alcohol dependence and treatment.   CLINICAL FACTORS:   Depression:   Anhedonia   Musculoskeletal: Strength & Muscle Tone: within normal limits Gait & Station: normal Patient leans: N/A  Psychiatric Specialty Exam:  Presentation  General Appearance:  Appropriate for Environment  Eye Contact: Fleeting  Speech: Clear and Coherent  Speech Volume: Normal  Handedness: Right   Mood and Affect  Mood: Depressed; Hopeless  Affect: Appropriate; Tearful   Thought Process  Thought Processes: Coherent  Descriptions of Associations:Intact  Orientation:Full (Time, Place and Person)  Thought Content:Logical  History of Schizophrenia/Schizoaffective disorder:No data recorded Duration of Psychotic Symptoms:No data recorded Hallucinations:Hallucinations: None  Ideas of Reference:None  Suicidal Thoughts:Suicidal Thoughts: Yes, Passive  Homicidal Thoughts:Homicidal Thoughts: No   Sensorium  Memory: Immediate Good; Recent Good  Judgment: Poor  Insight: Fair   Chartered certified accountant: Fair  Attention Span: Fair  Recall: [employer] of Knowledge: Fair  Language: Fair   Psychomotor Activity  Psychomotor Activity: Psychomotor Activity: Normal   Assets   Assets: Manufacturing systems engineer; Desire for Improvement; Social Support   Sleep  Sleep: Sleep: Poor     Blood pressure 134/73, pulse 87, temperature 98.1 F (36.7 C), resp. rate 18, height 5\' 3"  (1.6 m), weight 77.8 kg, SpO2 99%. Body mass index is 30.38 kg/m.   COGNITIVE FEATURES THAT CONTRIBUTE TO RISK:  None    SUICIDE RISK:   Mild:  Suicidal ideation of limited frequency, intensity, duration, and specificity.  There are no identifiable plans, no associated intent, mild dysphoria and related symptoms, good self-control (both objective and subjective assessment), few other risk factors, and identifiable protective factors, including available and accessible social support.  PLAN OF CARE: See orders  I certify that inpatient services furnished can reasonably be expected to improve the patient's condition.   Natalie Edwards Tresea Mall, DO 12/23/2022, 11:13 AM

## 2022-12-23 NOTE — Telephone Encounter (Signed)
This encounter has been addressed.

## 2022-12-23 NOTE — Progress Notes (Signed)
   12/23/22 1500  Spiritual Encounters  Type of Visit Initial  Care provided to: Patient  Conversation partners present during encounter Social worker/Care management/TOC  Referral source Social worker/Care management/TOC  Reason for visit Routine spiritual support  OnCall Visit No  Spiritual Framework  Presenting Themes Meaning/purpose/sources of inspiration;Goals in life/care;Significant life change;Impactful experiences and emotions;Courage hope and growth  Community/Connection Family  Patient Stress Factors Family relationships;Financial concerns  Family Stress Factors Family relationships;Financial concerns  Interventions  Spiritual Care Interventions Made Established relationship of care and support;Compassionate presence;Reflective listening;Narrative/life review;Encouragement  Intervention Outcomes  Outcomes Connection to spiritual care;Awareness around self/spiritual resourses;Autonomy/agency;Reduced anxiety;Reduced isolation  Spiritual Care Plan  Spiritual Care Issues Still Outstanding Chaplain will continue to follow   Social worker asked the chaplain to go and speak with the patient because she has been feeling very down. Chaplain met with the patient to discuss. Patient shared with chaplain that she is scared that she will be evicted because she does not have the finances. Patient also shared that she is hurt that her daughter will not allow her  to come and live with her. Chaplain offered the patients words of hope and encouragement and told her that she would talk to the social worker about resources. Social worker told the chaplain that they are working to get patient the help she needs. Chaplain will continue to follow up with patient.

## 2022-12-23 NOTE — Group Note (Signed)
Recreation Therapy Group Note   Group Topic:Relaxation  Group Date: 12/23/2022 Start Time: 1400 End Time: 1455 Facilitators: Rosina Lowenstein, LRT, CTRS Location: Courtyard  Group Description: Meditation. LRT and patients discussed what they know about meditation and mindfulness. LRT played a Deep Breathing Meditation exercise script for patients to follow along to. LRT and patients discussed how meditation and deep breathing can be used as a coping skill post--discharge. After the meditation, LRT took song requests from pts to listen to while getting fresh air and sunlight.  Goal Area(s) Addressed: Patient will practice using relaxation technique. Patient will identify a new coping skill.  Patient will follow multistep directions to reduce anxiety and stress.   Affect/Mood: Appropriate   Participation Level: Active and Engaged   Participation Quality: Independent   Behavior: Appropriate, Calm, and Cooperative   Speech/Thought Process: Coherent   Insight: Good   Judgement: Fair    Modes of Intervention: Activity   Patient Response to Interventions:  Attentive and Receptive   Education Outcome:  Acknowledges education   Clinical Observations/Individualized Feedback: Natalie Edwards was active in their participation of session activities and group discussion.  Pt followed along with the prompt appropriately. Pt interacted well with LRT and peers duration of session.   Plan: Continue to engage patient in RT group sessions 2-3x/week.   Rosina Lowenstein, LRT, CTRS 12/23/2022 3:04 PM

## 2022-12-23 NOTE — Progress Notes (Signed)
Admission Note:  57 yr AAF,  who presents voluntary in no acute distress for the treatment of SI and Depression. Pt appears flat and depressed. Pt was calm and cooperative with admission process. Pt presents with no current SI, plan or intent and contracts for safety upon admission. Pt denies AVH . Pt experienced SI earlier in the day due to significant loss of mother last year and the anniversary of her death in additional to worsen depression. Patient reports that she is unable to work due to worsen depression and anxiety and have been denied disability benefits and is in jeopardy of losing her housing. Skin was assessed and found to be clear of any abnormal marks , tattoo was a necklace. PT searched and no contraband found, POC and unit policies explained and understanding verbalized. Consents obtained. Food and fluids offered, and accepted. Pt had no additional questions or concerns

## 2022-12-23 NOTE — Group Note (Signed)
Date:  12/23/2022 Time:  11:13 AM  Group Topic/Focus:  Outside Rec /Music Therapy This group is to allow patients to get fresh air while doing outside activities while also listening to music     Participation Level:  Did Not Attend  Participation Quality:    Affect:    Cognitive:    Insight:   Engagement in Group:    Modes of Intervention:    Additional Comments:  Did not attend   Kolbie Clarkston T Michiah Mudry 12/23/2022, 11:13 AM

## 2022-12-24 ENCOUNTER — Encounter: Payer: Self-pay | Admitting: Internal Medicine

## 2022-12-24 DIAGNOSIS — F332 Major depressive disorder, recurrent severe without psychotic features: Secondary | ICD-10-CM | POA: Diagnosis not present

## 2022-12-24 MED ORDER — TRAZODONE HCL 100 MG PO TABS
100.0000 mg | ORAL_TABLET | Freq: Every evening | ORAL | Status: DC | PRN
  Administered 2022-12-24 – 2022-12-26 (×3): 100 mg via ORAL
  Filled 2022-12-24 (×3): qty 1

## 2022-12-24 NOTE — Plan of Care (Signed)
Problem: Education: Goal: Knowledge of General Education information will improve Description: Including pain rating scale, medication(s)/side effects and non-pharmacologic comfort measures Outcome: Progressing   Problem: Health Behavior/Discharge Planning: Goal: Ability to manage health-related needs will improve Outcome: Progressing   Problem: Clinical Measurements: Goal: Ability to maintain clinical measurements within normal limits will improve Outcome: Progressing Goal: Will remain free from infection Outcome: Progressing Goal: Diagnostic test results will improve Outcome: Progressing Goal: Respiratory complications will improve Outcome: Progressing Goal: Cardiovascular complication will be avoided Outcome: Progressing   Problem: Activity: Goal: Risk for activity intolerance will decrease Outcome: Progressing   Problem: Nutrition: Goal: Adequate nutrition will be maintained Outcome: Progressing   Problem: Coping: Goal: Level of anxiety will decrease Outcome: Progressing   Problem: Elimination: Goal: Will not experience complications related to bowel motility Outcome: Progressing Goal: Will not experience complications related to urinary retention Outcome: Progressing   Problem: Pain Managment: Goal: General experience of comfort will improve Outcome: Progressing   Problem: Safety: Goal: Ability to remain free from injury will improve Outcome: Progressing   Problem: Skin Integrity: Goal: Risk for impaired skin integrity will decrease Outcome: Progressing   Problem: Education: Goal: Utilization of techniques to improve thought processes will improve Outcome: Progressing Goal: Knowledge of the prescribed therapeutic regimen will improve Outcome: Progressing   Problem: Activity: Goal: Interest or engagement in leisure activities will improve Outcome: Progressing Goal: Imbalance in normal sleep/wake cycle will improve Outcome: Progressing   Problem:  Coping: Goal: Coping ability will improve Outcome: Progressing Goal: Will verbalize feelings Outcome: Progressing   Problem: Health Behavior/Discharge Planning: Goal: Ability to make decisions will improve Outcome: Progressing Goal: Compliance with therapeutic regimen will improve Outcome: Progressing   Problem: Role Relationship: Goal: Will demonstrate positive changes in social behaviors and relationships Outcome: Progressing   Problem: Safety: Goal: Ability to disclose and discuss suicidal ideas will improve Outcome: Progressing Goal: Ability to identify and utilize support systems that promote safety will improve Outcome: Progressing   Problem: Self-Concept: Goal: Will verbalize positive feelings about self Outcome: Progressing Goal: Level of anxiety will decrease Outcome: Progressing

## 2022-12-24 NOTE — BH IP Treatment Plan (Signed)
Interdisciplinary Treatment and Diagnostic Plan Update  12/24/2022 Time of Session: 10:41AM Natalie Edwards MRN: [id number]  Principal Diagnosis: MDD (major depressive disorder)  Secondary Diagnoses: Principal Problem:   MDD (major depressive disorder)   Current Medications:  Current Facility-Administered Medications  Medication Dose Route Frequency Provider Last Rate Last Admin   acetaminophen (TYLENOL) tablet 650 mg  650 mg Oral Q6H PRN Jearld Lesch, NP       alum & mag hydroxide-simeth (MAALOX/MYLANTA) 200-200-20 MG/5ML suspension 30 mL  30 mL Oral Q4H PRN Durwin Nora, Rashaun M, NP       amLODipine (NORVASC) tablet 5 mg  5 mg Oral Daily Sarina Ill, DO   5 mg at 12/24/22 0914   FLUoxetine (PROZAC) capsule 20 mg  20 mg Oral Daily Sarina Ill, DO   20 mg at 12/24/22 0914   LORazepam (ATIVAN) tablet 1 mg  1 mg Oral Q4H PRN Sarina Ill, DO       magnesium hydroxide (MILK OF MAGNESIA) suspension 30 mL  30 mL Oral Daily PRN Jearld Lesch, NP       mirtazapine (REMERON) tablet 15 mg  15 mg Oral QHS Sarina Ill, DO   15 mg at 12/23/22 2200   pantoprazole (PROTONIX) EC tablet 40 mg  40 mg Oral Daily Sarina Ill, DO   40 mg at 12/24/22 0914   polyethylene glycol (MIRALAX / GLYCOLAX) packet 17 g  17 g Oral Daily Sarina Ill, DO   17 g at 12/24/22 0932   pregabalin (LYRICA) capsule 75 mg  75 mg Oral BID Sarina Ill, DO   75 mg at 12/24/22 0914   risperiDONE (RISPERDAL) tablet 0.5 mg  0.5 mg Oral [id number] Sarina Ill, DO   0.5 mg at 12/24/22 0914   temazepam (RESTORIL) capsule 15 mg  15 mg Oral QHS PRN Foye Deer, RPH       traZODone (DESYREL) tablet 100 mg  100 mg Oral QHS PRN Sarina Ill, DO       PTA Medications: Medications Prior to Admission  Medication Sig Dispense Refill Last Dose   acetaminophen (TYLENOL) 325 MG tablet Take 2 tablets (650 mg total) by mouth every 6 (six) hours  as needed for mild pain (or Fever >/= 101). 20 tablet 0    amLODipine-benazepril (LOTREL) 10-40 MG capsule Take 1 capsule by mouth daily. 90 capsule 1    BELSOMRA 20 MG TABS Take 1 tablet (20 mg total) by mouth at bedtime. 30 tablet 1    dicyclomine (BENTYL) 20 MG tablet Take 1 tablet (20 mg total) by mouth 2 (two) times daily as needed for spasms. 60 tablet 3    diphenhydramine-acetaminophen (TYLENOL PM) 25-500 MG TABS tablet Take 1 tablet by mouth at bedtime as needed (pain).      hydrOXYzine (ATARAX) 50 MG tablet Take 50 mg by mouth 3 (three) times daily as needed.      loratadine (CLARITIN) 10 MG tablet Take 1 tablet (10 mg total) by mouth daily as needed for allergies. 30 tablet 0    mirtazapine (REMERON) 45 MG tablet Take 1 tablet (45 mg total) by mouth at bedtime. 90 tablet 1    ondansetron (ZOFRAN) 4 MG tablet Take 1 tablet (4 mg total) by mouth every 8 (eight) hours as needed for nausea. 40 tablet 3    pantoprazole (PROTONIX) 40 MG tablet Take 1 tablet (40 mg total) by mouth daily. Take 1 tablet 30 minutes prior  to breakfast each day 30 tablet 5    polyethylene glycol (MIRALAX / GLYCOLAX) 17 g packet Take 17 g by mouth daily as needed for mild constipation. 14 each 0    pregabalin (LYRICA) 75 MG capsule Take 75 mg by mouth 2 (two) times daily.      temazepam (RESTORIL) 30 MG capsule Take 1 capsule (30 mg total) by mouth at bedtime as needed. 30 capsule 3     Patient Stressors:    Patient Strengths:    Treatment Modalities: Medication Management, Group therapy, Case management,  1 to 1 session with clinician, Psychoeducation, Recreational therapy.   Physician Treatment Plan for Primary Diagnosis: MDD (major depressive disorder) Long Term Goal(s): Improvement in symptoms so as ready for discharge   Short Term Goals: Ability to identify changes in lifestyle to reduce recurrence of condition will improve Ability to verbalize feelings will improve Ability to disclose and discuss  suicidal ideas Ability to demonstrate self-control will improve Ability to identify and develop effective coping behaviors will improve Ability to maintain clinical measurements within normal limits will improve Compliance with prescribed medications will improve Ability to identify triggers associated with substance abuse/mental health issues will improve  Medication Management: Evaluate patient's response, side effects, and tolerance of medication regimen.  Therapeutic Interventions: 1 to 1 sessions, Unit Group sessions and Medication administration.  Evaluation of Outcomes: Not Met  Physician Treatment Plan for Secondary Diagnosis: Principal Problem:   MDD (major depressive disorder)  Long Term Goal(s): Improvement in symptoms so as ready for discharge   Short Term Goals: Ability to identify changes in lifestyle to reduce recurrence of condition will improve Ability to verbalize feelings will improve Ability to disclose and discuss suicidal ideas Ability to demonstrate self-control will improve Ability to identify and develop effective coping behaviors will improve Ability to maintain clinical measurements within normal limits will improve Compliance with prescribed medications will improve Ability to identify triggers associated with substance abuse/mental health issues will improve     Medication Management: Evaluate patient's response, side effects, and tolerance of medication regimen.  Therapeutic Interventions: 1 to 1 sessions, Unit Group sessions and Medication administration.  Evaluation of Outcomes: Not Met   RN Treatment Plan for Primary Diagnosis: MDD (major depressive disorder) Long Term Goal(s): Knowledge of disease and therapeutic regimen to maintain health will improve  Short Term Goals: Ability to demonstrate self-control, Ability to participate in decision making will improve, Ability to verbalize feelings will improve, Ability to disclose and discuss suicidal  ideas, Ability to identify and develop effective coping behaviors will improve, and Compliance with prescribed medications will improve  Medication Management: RN will administer medications as ordered by provider, will assess and evaluate patient's response and provide education to patient for prescribed medication. RN will report any adverse and/or side effects to prescribing provider.  Therapeutic Interventions: 1 on 1 counseling sessions, Psychoeducation, Medication administration, Evaluate responses to treatment, Monitor vital signs and CBGs as ordered, Perform/monitor CIWA, COWS, AIMS and Fall Risk screenings as ordered, Perform wound care treatments as ordered.  Evaluation of Outcomes: Not Met   LCSW Treatment Plan for Primary Diagnosis: MDD (major depressive disorder) Long Term Goal(s): Safe transition to appropriate next level of care at discharge, Engage patient in therapeutic group addressing interpersonal concerns.  Short Term Goals: Engage patient in aftercare planning with referrals and resources, Increase social support, Increase ability to appropriately verbalize feelings, Increase emotional regulation, Facilitate acceptance of mental health diagnosis and concerns, and Increase skills for wellness and recovery  Therapeutic Interventions: Assess for all discharge needs, 1 to 1 time with Child psychotherapist, Explore available resources and support systems, Assess for adequacy in community support network, Educate family and significant other(s) on suicide prevention, Complete Psychosocial Assessment, Interpersonal group therapy.  Evaluation of Outcomes: Not Met   Progress in Treatment: Attending groups: Yes. Participating in groups: Yes. Taking medication as prescribed: Yes. Toleration medication: Yes. Family/Significant other contact made: No, will contact:  once permission has been provided.  Patient understands diagnosis: Yes. Discussing patient identified problems/goals with  staff: Yes. Medical problems stabilized or resolved: Yes. Denies suicidal/homicidal ideation: Yes. Issues/concerns per patient self-inventory: No. Other: none  New problem(s) identified: No, Describe:  none  New Short Term/Long Term Goal(s): detox, elimination of symptoms of psychosis, medication management for mood stabilization; elimination of SI thoughts; development of comprehensive mental wellness/sobriety plan.   Patient Goals:  "to not be angry no more"  Discharge Plan or Barriers: CSW to assist patient in development of appropriate discharge plans.    Reason for Continuation of Hospitalization: Anxiety Depression Medication stabilization Suicidal ideation  Estimated Length of Stay:  1-7 days  Last 3 [place] Suicide Severity Risk Score: Flowsheet Row Admission (Current) from 12/23/2022 in [facility] ED from 12/22/2022 in [facility] Department [facility] ED from 10/24/2022 in [facility] Department at [facility]  C-SSRS RISK CATEGORY No Risk High Risk No Risk       Last [facility] 2/9 Scores:    10/28/2022    2:47 PM 10/08/2022   10:41 AM 09/01/2022    3:31 PM  Depression screen PHQ 2/9  Decreased Interest 0 0 0  Down, Depressed, Hopeless 0 0 0  PHQ - 2 Score 0 0 0  Altered sleeping 0 0 0  Tired, decreased energy 0 0 0  Change in appetite 0 0 0  Feeling bad or failure about yourself  0 0 0  Trouble concentrating 0 0 0  Moving slowly or fidgety/restless 0 0 0  Suicidal thoughts 0 0 0  PHQ-9 Score 0 0 0  Difficult doing work/chores Not difficult at all Not difficult at all Not difficult at all    Scribe for Treatment Team: Harden Mo, LCSW 12/24/2022 12:37 PM

## 2022-12-24 NOTE — Progress Notes (Signed)
   12/24/22 1152  Psych Admission Type (Psych Patients Only)  Admission Status Voluntary  Psychosocial Assessment  Eye Contact Brief  Facial Expression Anxious  Affect Flat  Speech Soft  Interaction Assertive  Motor Activity Slow  Appearance/Hygiene In scrubs  Behavior Characteristics Cooperative  Mood Anxious  Thought Process  Coherency WDL  Content WDL  Delusions None reported or observed  Perception WDL  Hallucination None reported or observed  Judgment WDL  Confusion None  Danger to Self  Current suicidal ideation? Denies  Agreement Not to Harm Self Yes  Danger to Others  Danger to Others None reported or observed

## 2022-12-24 NOTE — Group Note (Signed)
Date:  12/24/2022 Time:  8:43 PM  Group Topic/Focus:  Goals Group:   The focus of this group is to help patients establish daily goals to achieve during treatment and discuss how the patient can incorporate goal setting into their daily lives to aide in recovery. Self Esteem Action Plan:   The focus of this group is to help patients create a plan to continue to build self-esteem after discharge.    Participation Level:  Active  Participation Quality:  Appropriate  Affect:  Appropriate  Cognitive:  Appropriate  Insight: Appropriate  Engagement in Group:  Engaged  Modes of Intervention:  Discussion  Additional Comments:    Natalie Edwards 12/24/2022, 8:43 PM
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
## 2022-12-24 NOTE — Group Note (Signed)
Recreation Therapy Group Note   Group Topic:General Recreation  Group Date: 12/24/2022 Start Time: 1410 End Time: 1455 Facilitators: Rosina Lowenstein, LRT, CTRS Location: Courtyard  Group Description: Outdoor Recreation. Patients had the option to play the card game rummy, ring toss, and listen to music while outside in the courtyard getting fresh air and sunlight. LRT and pts discussed things that they enjoy doing in their free time outside of the hospital.   Goal Area(s) Addressed: Patient will identify leisure interests.  Patient will practice healthy decision making. Patient will engage in recreation activity.   Affect/Mood: N/A   Participation Level: Did not attend    Clinical Observations/Individualized Feedback: Noble did not attend group.  Plan: Continue to engage patient in RT group sessions 2-3x/week.   Rosina Lowenstein, LRT, CTRS 12/24/2022 3:17 PM

## 2022-12-24 NOTE — Group Note (Signed)
Date:  12/24/2022 Time:  10:28 AM  Group Topic/Focus:  Managing Feelings:   The focus of this group is to identify what feelings patients have difficulty handling and develop a plan to handle them in a healthier way upon discharge.    Participation Level:  Active  Participation Quality:  Appropriate  Affect:  Appropriate  Cognitive:  Appropriate  Insight: Appropriate  Engagement in Group:  Engaged  Modes of Intervention:  Discussion and Education  Additional Comments:  none  Rodena Goldmann 12/24/2022, 10:28 AM

## 2022-12-24 NOTE — Progress Notes (Signed)
[facility] MD Progress Note  12/24/2022 12:10 PM Natalie Edwards  MRN:  [id number] Subjective: Natalie Edwards is seen on rounds.  She says that she feels much better on the new medications.  She says that she slept well.  She says before she got her medicine this morning that she was angry and it has been helping with her anger.  She denies any suicidal or homicidal ideation.  Her affect is much improved.  We talked about discharge on Saturday.  She enjoys being here and he is getting something out of the groups. Principal Problem: MDD (major depressive disorder) Diagnosis: Principal Problem:   MDD (major depressive disorder)  Total Time spent with patient: 15 minutes  Past Psychiatric History: Depression  Past Medical History:  Past Medical History:  Diagnosis Date   Allergy    Anemia    Diverticulosis    Gall bladder stones 1993   Gastric ulcer    GERD (gastroesophageal reflux disease)    Hypertension    Renal mass     Past Surgical History:  Procedure Laterality Date   ABDOMINAL HYSTERECTOMY     BIOPSY  09/29/2022   Procedure: BIOPSY;  Surgeon: Napoleon Form, MD;  Location: [facility];  Service: Gastroenterology;;   CHOLECYSTECTOMY     ESOPHAGOGASTRODUODENOSCOPY (EGD) WITH PROPOFOL N/A 09/29/2022   Procedure: ESOPHAGOGASTRODUODENOSCOPY (EGD) WITH PROPOFOL;  Surgeon: Napoleon Form, MD;  Location: [facility];  Service: Gastroenterology;  Laterality: N/A;   Family History:  Family History  Problem Relation Age of Onset   Cancer Maternal Uncle        Lung   Cancer Maternal Uncle        Lung   Cancer Maternal Uncle        Lung   Headache Neg Hx        "I don't think so"   Migraines Neg Hx        "I don't think so"   Family Psychiatric  History: Unremarkable Social History:  Social History   Substance and Sexual Activity  Alcohol Use Yes   Comment: occasionally - last drink was July 4, 1 shot     Social History   Substance and Sexual Activity  Drug Use No     Social History   Socioeconomic History   Marital status: Single    Spouse name: Not on file   Number of children: 3   Years of education: Not on file   Highest education level: High school graduate  Occupational History   Occupation: environmental services at [employer]  Tobacco Use   Smoking status: Every Day    Current packs/day: 0.50    Average packs/day: 0.5 packs/day for 25.0 years (12.5 ttl pk-yrs)    Types: Cigarettes   Smokeless tobacco: Never   Tobacco comments:    Pt tried to quit - helps with anxiety     1 pack last patient 3 days   Vaping Use   Vaping status: Never Used  Substance and Sexual Activity   Alcohol use: Yes    Comment: occasionally - last drink was July 4, 1 shot   Drug use: No   Sexual activity: Yes    Comment: hysterectomy  Other Topics Concern   Not on file  Social History Narrative   Lives at home in an apartment. Her mother lives with her.    Right handed   Caffeine: drinks approx. 36 oz of pepsi per day. Sometimes drinks 2 cups of coffee in a day as  well.    Social Determinants of Health   Financial Resource Strain: High Risk (05/22/2022)   Overall Financial Resource Strain (CARDIA)    Difficulty of Paying Living Expenses: Very hard  Food Insecurity: No Food Insecurity (12/23/2022)   Hunger Vital Sign    Worried About Running Out of Food in the Last Year: Never true    Ran Out of Food in the Last Year: Never true  Transportation Needs: No Transportation Needs (12/23/2022)   PRAPARE - Administrator, Civil Service (Medical): No    Lack of Transportation (Non-Medical): No  Physical Activity: Sufficiently Active (05/22/2022)   Exercise Vital Sign    Days of Exercise per Week: 4 days    Minutes of Exercise per Session: 70 min  Stress: Stress Concern Present (05/22/2022)   [employer] of Occupational Health - Occupational Stress Questionnaire    Feeling of Stress : Very much  Social Connections: Moderately Isolated (05/22/2022)    Social Connection and Isolation Panel [NHANES]    Frequency of Communication with Friends and Family: More than three times a week    Frequency of Social Gatherings with Friends and Family: Never    Attends Religious Services: Never    Database administrator or Organizations: Yes    Attends Banker Meetings: Never    Marital Status: Never married   Additional Social History:                         Sleep: Good  Appetite:  Good  Current Medications: Current Facility-Administered Medications  Medication Dose Route Frequency Provider Last Rate Last Admin   acetaminophen (TYLENOL) tablet 650 mg  650 mg Oral Q6H PRN Jearld Lesch, NP       alum & mag hydroxide-simeth (MAALOX/MYLANTA) 200-200-20 MG/5ML suspension 30 mL  30 mL Oral Q4H PRN Dixon, Rashaun M, NP       amLODipine (NORVASC) tablet 5 mg  5 mg Oral Daily Sarina Ill, DO   5 mg at 12/24/22 0914   FLUoxetine (PROZAC) capsule 20 mg  20 mg Oral Daily Sarina Ill, DO   20 mg at 12/24/22 0914   LORazepam (ATIVAN) tablet 1 mg  1 mg Oral Q4H PRN Sarina Ill, DO       magnesium hydroxide (MILK OF MAGNESIA) suspension 30 mL  30 mL Oral Daily PRN Jearld Lesch, NP       mirtazapine (REMERON) tablet 15 mg  15 mg Oral QHS Sarina Ill, DO   15 mg at 12/23/22 2200   OLANZapine (ZYPREXA) tablet 10 mg  10 mg Oral Q6H PRN Sarina Ill, DO   10 mg at 12/23/22 2058   pantoprazole (PROTONIX) EC tablet 40 mg  40 mg Oral Daily Sarina Ill, DO   40 mg at 12/24/22 0914   polyethylene glycol (MIRALAX / GLYCOLAX) packet 17 g  17 g Oral Daily Sarina Ill, DO   17 g at 12/24/22 0932   pregabalin (LYRICA) capsule 75 mg  75 mg Oral BID Sarina Ill, DO   75 mg at 12/24/22 0914   risperiDONE (RISPERDAL) tablet 0.5 mg  0.5 mg Oral [id number] Sarina Ill, DO   0.5 mg at 12/24/22 0914   temazepam (RESTORIL) capsule 15 mg  15 mg Oral  QHS PRN Foye Deer, RPH       traZODone (DESYREL) tablet 50 mg  50 mg Oral QHS  PRN Jearld Lesch, NP   50 mg at 12/23/22 2058    Lab Results:  Results for orders placed or performed during the hospital encounter of 12/22/22 (from the past 48 hour(s))  Basic metabolic panel     Status: Abnormal   Collection Time: 12/22/22 12:42 PM  Result Value Ref Range   Sodium 137 135 - 145 mmol/L   Potassium 4.0 3.5 - 5.1 mmol/L   Chloride 108 98 - 111 mmol/L   CO2 18 (L) 22 - 32 mmol/L   Glucose, Bld 89 70 - 99 mg/dL    Comment: Glucose reference range applies only to samples taken after fasting for at least 8 hours.   BUN 10 6 - 20 mg/dL   Creatinine, Ser 4.40 0.44 - 1.00 mg/dL   Calcium 9.5 8.9 - 10.2 mg/dL   GFR, Estimated >72 >53 mL/min    Comment: (NOTE) Calculated using the CKD-EPI Creatinine Equation (2021)    Anion gap 11 5 - 15    Comment: Performed at [facility], [address], [place], [place] [address]  SARS Coronavirus 2 by RT PCR (hospital order, performed in [facility] hospital lab) *cepheid single result test* Anterior Nasal Swab     Status: None   Collection Time: 12/22/22  3:59 PM   Specimen: Anterior Nasal Swab  Result Value Ref Range   SARS Coronavirus 2 by RT PCR NEGATIVE NEGATIVE    Comment: Performed at [facility], [address], [place], [place] [address]    Blood Alcohol level:  Lab Results  Component Value Date   ETH <10 12/22/2022    Metabolic Disorder Labs: Lab Results  Component Value Date   HGBA1C 5.7 (H) 12/13/2021   No results found for: "PROLACTIN" Lab Results  Component Value Date   CHOL 202 (H) 12/13/2021   TRIG 113 12/13/2021   HDL 76 12/13/2021   CHOLHDL 2.7 12/13/2021   LDLCALC 106 (H) 12/13/2021   LDLCALC 104 (H) 05/31/2018    Physical Findings: AIMS:  , ,  ,  ,    CIWA:    COWS:     Musculoskeletal: Strength & Muscle Tone: within normal limits Gait & Station: normal Patient leans: N/A  Psychiatric  Specialty Exam:  Presentation  General Appearance:  Appropriate for Environment  Eye Contact: Fleeting  Speech: Clear and Coherent  Speech Volume: Normal  Handedness: Right   Mood and Affect  Mood: Depressed; Hopeless  Affect: Appropriate; Tearful   Thought Process  Thought Processes: Coherent  Descriptions of Associations:Intact  Orientation:Full (Time, Place and Person)  Thought Content:Logical  History of Schizophrenia/Schizoaffective disorder:No data recorded Duration of Psychotic Symptoms:No data recorded Hallucinations:No data recorded Ideas of Reference:None  Suicidal Thoughts:No data recorded Homicidal Thoughts:No data recorded  Sensorium  Memory: Immediate Good; Recent Good  Judgment: Poor  Insight: Fair   Art therapist  Concentration: Fair  Attention Span: Fair  Recall: [employer] of Knowledge: Fair  Language: Fair   Psychomotor Activity  Psychomotor Activity:No data recorded  Assets  Assets: Communication Skills; Desire for Improvement; Social Support   Sleep  Sleep:No data recorded   Blood pressure 103/68, pulse 86, temperature 97.7 F (36.5 C), resp. rate 16, height 5\' 3"  (1.6 m), weight 77.8 kg, SpO2 96%. Body mass index is 30.38 kg/m.   Treatment Plan Summary: Daily contact with patient to assess and evaluate symptoms and progress in treatment, Medication management, and Plan continue current medications.  Ravonda Brecheen Tresea Mall, DO  12/24/2022, 12:10 PM

## 2022-12-24 NOTE — Group Note (Signed)
[facility] LCSW Group Therapy Note   Group Date: 12/24/2022 Start Time: 1330 End Time: 1415   Type of Therapy/Topic:  Group Therapy:  Emotion Regulation  Participation Level:  Active   Mood:  Description of Group:    The purpose of this group is to assist patients in learning to regulate negative emotions and experience positive emotions. Patients will be guided to discuss ways in which they have been vulnerable to their negative emotions. These vulnerabilities will be juxtaposed with experiences of positive emotions or situations, and patients challenged to use positive emotions to combat negative ones. Special emphasis will be placed on coping with negative emotions in conflict situations, and patients will process healthy conflict resolution skills.  Therapeutic Goals: Patient will identify two positive emotions or experiences to reflect on in order to balance out negative emotions:  Patient will label two or more emotions that they find the most difficult to experience:  Patient will be able to demonstrate positive conflict resolution skills through discussion or role plays:   Summary of Patient Progress: Patient was present in group. Patient was an active participant in group. Patient shared how "sadness and hurt" were feelings that led to their admission to the hospital. She also reported that "control" is what emotion regulation means to her.  She reports that to address her negative emotions she tries to "talk" it out.  Patient was supportive of other group members and receptive to feedback.  Patient displayed fair insight.  Therapeutic Modalities:   Cognitive Behavioral Therapy Feelings Identification Dialectical Behavioral Therapy   Harden Mo, LCSW

## 2022-12-24 NOTE — Progress Notes (Signed)
D- Patient alert and oriented x4, affect/mood sad and depressed, reported that talking with husband is a trigger " he holds things against me". Pt. denies SI, HI,plan or intent Pt stated she had a good day, attended and participated in groups. A- Scheduled medications administered to patient, per MD orders. Support and encouragement provided.  Routine safety checks conducted every 15 minutes.  Patient informed to notify staff with problems or concerns. R- No adverse drug reactions noted. Patient contracts for safety at this time. Patient compliant with medications and treatment plan. Patient receptive, calm, and cooperative. Patient interacts well with others on the unit.  Patient remains safe at this time.

## 2022-12-25 ENCOUNTER — Other Ambulatory Visit: Payer: Self-pay | Admitting: *Deleted

## 2022-12-25 ENCOUNTER — Encounter: Payer: Self-pay | Admitting: Psychiatric/Mental Health

## 2022-12-25 DIAGNOSIS — F332 Major depressive disorder, recurrent severe without psychotic features: Secondary | ICD-10-CM | POA: Diagnosis not present

## 2022-12-25 DIAGNOSIS — R109 Unspecified abdominal pain: Secondary | ICD-10-CM

## 2022-12-25 DIAGNOSIS — R112 Nausea with vomiting, unspecified: Secondary | ICD-10-CM

## 2022-12-25 MED ORDER — INFLUENZA VIRUS VACC SPLIT PF (FLUZONE) 0.5 ML IM SUSY
0.5000 mL | PREFILLED_SYRINGE | INTRAMUSCULAR | Status: AC
  Administered 2022-12-26: 0.5 mL via INTRAMUSCULAR
  Filled 2022-12-25: qty 0.5

## 2022-12-25 MED ORDER — NICOTINE 14 MG/24HR TD PT24
14.0000 mg | MEDICATED_PATCH | Freq: Every day | TRANSDERMAL | Status: DC
  Administered 2022-12-25 – 2022-12-27 (×3): 14 mg via TRANSDERMAL
  Filled 2022-12-25 (×3): qty 1

## 2022-12-25 NOTE — Progress Notes (Signed)
[facility] MD Progress Note  12/25/2022 1:16 PM Natalie Edwards  MRN:  [id number] Subjective: [name] tells me that she is feeling better.  She is sleeping better and her appetite is improved.  She feels like the medications have been helpful.  She denies any side effects.  Nurses report no issues. Principal Problem: MDD (major depressive disorder) Diagnosis: Principal Problem:   MDD (major depressive disorder)  Total Time spent with patient: 15 minutes  Past Psychiatric History: Depression  Past Medical History:  Past Medical History:  Diagnosis Date   Allergy    Anemia    Diverticulosis    Gall bladder stones 1993   Gastric ulcer    GERD (gastroesophageal reflux disease)    Hypertension    Renal mass     Past Surgical History:  Procedure Laterality Date   ABDOMINAL HYSTERECTOMY     BIOPSY  09/29/2022   Procedure: BIOPSY;  Surgeon: Napoleon Form, MD;  Location: [facility];  Service: Gastroenterology;;   CHOLECYSTECTOMY     ESOPHAGOGASTRODUODENOSCOPY (EGD) WITH PROPOFOL N/A 09/29/2022   Procedure: ESOPHAGOGASTRODUODENOSCOPY (EGD) WITH PROPOFOL;  Surgeon: Napoleon Form, MD;  Location: [facility];  Service: Gastroenterology;  Laterality: N/A;   Family History:  Family History  Problem Relation Age of Onset   Cancer Maternal Uncle        Lung   Cancer Maternal Uncle        Lung   Cancer Maternal Uncle        Lung   Headache Neg Hx        "I don't think so"   Migraines Neg Hx        "I don't think so"   Family Psychiatric  History: Unremarkable Social History:  Social History   Substance and Sexual Activity  Alcohol Use Yes   Comment: occasionally - last drink was July 4, 1 shot     Social History   Substance and Sexual Activity  Drug Use No    Social History   Socioeconomic History   Marital status: Single    Spouse name: Not on file   Number of children: 3   Years of education: Not on file   Highest education level: High school graduate   Occupational History   Occupation: environmental services at [employer]  Tobacco Use   Smoking status: Every Day    Current packs/day: 0.50    Average packs/day: 0.5 packs/day for 25.0 years (12.5 ttl pk-yrs)    Types: Cigarettes   Smokeless tobacco: Never   Tobacco comments:    Pt tried to quit - helps with anxiety     1 pack last patient 3 days   Vaping Use   Vaping status: Never Used  Substance and Sexual Activity   Alcohol use: Yes    Comment: occasionally - last drink was July 4, 1 shot   Drug use: No   Sexual activity: Yes    Comment: hysterectomy  Other Topics Concern   Not on file  Social History Narrative   Lives at home in an apartment. Her mother lives with her.    Right handed   Caffeine: drinks approx. 36 oz of pepsi per day. Sometimes drinks 2 cups of coffee in a day as well.    Social Determinants of Health   Financial Resource Strain: High Risk (05/22/2022)   Overall Financial Resource Strain (CARDIA)    Difficulty of Paying Living Expenses: Very hard  Food Insecurity: No Food Insecurity (12/23/2022)   Hunger  Vital Sign    Worried About Programme researcher, broadcasting/film/video in the Last Year: Never true    Ran Out of Food in the Last Year: Never true  Transportation Needs: No Transportation Needs (12/23/2022)   PRAPARE - Administrator, Civil Service (Medical): No    Lack of Transportation (Non-Medical): No  Physical Activity: Sufficiently Active (05/22/2022)   Exercise Vital Sign    Days of Exercise per Week: 4 days    Minutes of Exercise per Session: 70 min  Stress: Stress Concern Present (05/22/2022)   [employer] of Occupational Health - Occupational Stress Questionnaire    Feeling of Stress : Very much  Social Connections: Moderately Isolated (05/22/2022)   Social Connection and Isolation Panel [NHANES]    Frequency of Communication with Friends and Family: More than three times a week    Frequency of Social Gatherings with Friends and Family: Never     Attends Religious Services: Never    Database administrator or Organizations: Yes    Attends Banker Meetings: Never    Marital Status: Never married   Additional Social History:                         Sleep: Good  Appetite:  Good  Current Medications: Current Facility-Administered Medications  Medication Dose Route Frequency Provider Last Rate Last Admin   acetaminophen (TYLENOL) tablet 650 mg  650 mg Oral Q6H PRN Jearld Lesch, NP       alum & mag hydroxide-simeth (MAALOX/MYLANTA) 200-200-20 MG/5ML suspension 30 mL  30 mL Oral Q4H PRN Dixon, Rashaun M, NP       amLODipine (NORVASC) tablet 5 mg  5 mg Oral Daily Sarina Ill, DO   5 mg at 12/25/22 1610   FLUoxetine (PROZAC) capsule 20 mg  20 mg Oral Daily Sarina Ill, DO   20 mg at 12/25/22 9604   LORazepam (ATIVAN) tablet 1 mg  1 mg Oral Q4H PRN Sarina Ill, DO       magnesium hydroxide (MILK OF MAGNESIA) suspension 30 mL  30 mL Oral Daily PRN Jearld Lesch, NP       mirtazapine (REMERON) tablet 15 mg  15 mg Oral QHS Sarina Ill, DO   15 mg at 12/24/22 2050   nicotine (NICODERM CQ - dosed in mg/24 hours) patch 14 mg  14 mg Transdermal Daily Sarina Ill, DO   14 mg at 12/25/22 0932   pantoprazole (PROTONIX) EC tablet 40 mg  40 mg Oral Daily Sarina Ill, DO   40 mg at 12/25/22 5409   polyethylene glycol (MIRALAX / GLYCOLAX) packet 17 g  17 g Oral Daily Sarina Ill, DO   17 g at 12/25/22 0935   pregabalin (LYRICA) capsule 75 mg  75 mg Oral BID Sarina Ill, DO   75 mg at 12/25/22 8119   risperiDONE (RISPERDAL) tablet 0.5 mg  0.5 mg Oral [id number] Sarina Ill, DO   0.5 mg at 12/25/22 1478   temazepam (RESTORIL) capsule 15 mg  15 mg Oral QHS PRN Foye Deer, RPH       traZODone (DESYREL) tablet 100 mg  100 mg Oral QHS PRN Sarina Ill, DO   100 mg at 12/24/22 2056    Lab Results: No results  found for this or any previous visit (from the past 48 hour(s)).  Blood Alcohol level:  Lab Results  Component Value Date   ETH <10 12/22/2022    Metabolic Disorder Labs: Lab Results  Component Value Date   HGBA1C 5.7 (H) 12/13/2021   No results found for: "PROLACTIN" Lab Results  Component Value Date   CHOL 202 (H) 12/13/2021   TRIG 113 12/13/2021   HDL 76 12/13/2021   CHOLHDL 2.7 12/13/2021   LDLCALC 106 (H) 12/13/2021   LDLCALC 104 (H) 05/31/2018    Physical Findings: AIMS:  , ,  ,  ,    CIWA:    COWS:     Musculoskeletal: Strength & Muscle Tone: within normal limits Gait & Station: normal Patient leans: N/A  Psychiatric Specialty Exam:  Presentation  General Appearance:  Appropriate for Environment  Eye Contact: Fleeting  Speech: Clear and Coherent  Speech Volume: Normal  Handedness: Right   Mood and Affect  Mood: Depressed; Hopeless  Affect: Appropriate; Tearful   Thought Process  Thought Processes: Coherent  Descriptions of Associations:Intact  Orientation:Full (Time, Place and Person)  Thought Content:Logical  History of Schizophrenia/Schizoaffective disorder:No data recorded Duration of Psychotic Symptoms:No data recorded Hallucinations:No data recorded Ideas of Reference:None  Suicidal Thoughts:No data recorded Homicidal Thoughts:No data recorded  Sensorium  Memory: Immediate Good; Recent Good  Judgment: Poor  Insight: Fair   Art therapist  Concentration: Fair  Attention Span: Fair  Recall: [employer] of Knowledge: Fair  Language: Fair   Psychomotor Activity  Psychomotor Activity:No data recorded  Assets  Assets: Communication Skills; Desire for Improvement; Social Support   Sleep  Sleep:No data recorded    Blood pressure 127/62, pulse (!) 114, temperature 98 F (36.7 C), resp. rate (!) 21, height 5\' 3"  (1.6 m), weight 77.8 kg, SpO2 96%. Body mass index is 30.38  kg/m.   Treatment Plan Summary: Daily contact with patient to assess and evaluate symptoms and progress in treatment, Medication management, and Plan continue current medications.  Plan on discharge Saturday.  Shamere Campas Tresea Mall, DO 12/25/2022, 1:16 PM

## 2022-12-25 NOTE — Progress Notes (Signed)
   12/24/22 2300  Psych Admission Type (Psych Patients Only)  Admission Status Voluntary  Psychosocial Assessment  Patient Complaints Depression  Eye Contact Fair  Facial Expression Sad  Affect Flat  Speech Soft  Interaction Assertive  Motor Activity Slow  Appearance/Hygiene In scrubs  Behavior Characteristics Cooperative  Mood Depressed;Pleasant  Thought Process  Coherency WDL  Content WDL  Delusions None reported or observed  Perception WDL  Hallucination None reported or observed  Judgment WDL  Confusion None  Danger to Self  Current suicidal ideation? Denies  Agreement Not to Harm Self Yes  Description of Agreement verbal  Danger to Others  Danger to Others None reported or observed

## 2022-12-25 NOTE — Progress Notes (Signed)
   12/25/22 0601  15 Minute Checks  Location Bedroom  Visual Appearance Calm  Behavior Sleeping  Sleep (Behavioral Health Patients Only)  Calculate sleep? (Click Yes once per 24 hr at 0600 safety check) Yes  Documented sleep last 24 hours 10

## 2022-12-25 NOTE — Progress Notes (Signed)
   12/25/22 1925  Psych Admission Type (Psych Patients Only)  Admission Status Voluntary  Psychosocial Assessment  Patient Complaints Depression  Eye Contact Fair  Facial Expression Sad  Affect Sad  Speech Soft  Interaction Assertive  Motor Activity Slow  Appearance/Hygiene In scrubs  Behavior Characteristics Cooperative  Mood Pleasant;Depressed  Thought Process  Coherency WDL  Content WDL  Delusions None reported or observed  Perception WDL  Hallucination None reported or observed  Judgment WDL  Confusion None  Danger to Self  Current suicidal ideation? Denies  Agreement Not to Harm Self Yes  Description of Agreement verbal  Danger to Others  Danger to Others None reported or observed   Progress note   D: Pt seen at nurse's station. Pt denies SI, HI, AVH. Pt rates pain  10/10 as abdominal pain that is sharp on the left side when she breathes in and out. Pt rates anxiety  0/10 and depression  8/10. Pt states that she is to be discharged on Saturday. "My son is going by the house today to see if the lights are off. I hope there is not an eviction notice on the door." Pt states she has no support system. She cannot live with any of her children and they cannot provide financial assistance. Pt's mother died a year ago around this time. Pt has strained relationship with her children due to past issues. "They don't think I did enough for them. I was helping my son out when my mother was dying in a hospital in [place]. They don't think about that." Pt is cooperative and seen in milieu on unit. Pt states that she did not sleep well last night. Her bed has been adjusted. Discusses a sleep regimen for tonight and pt is in agreement. No other concerns noted at this time.  A: Pt provided support and encouragement. Pt given scheduled medication as prescribed. PRNs as appropriate. Q15 min checks for safety.   R: Pt safe on the unit. Will continue to monitor.

## 2022-12-25 NOTE — Group Note (Signed)
Recreation Therapy Group Note   Group Topic:Leisure Education  Group Date: 12/25/2022 Start Time: 1400 End Time: 1500 Facilitators: Rosina Lowenstein, LRT, CTRS Location:  Day Room  Group Description: Bingo. LRT and patients played multiple games of Bingo with music playing in the background. LRT and pts discussed how this could be a leisure interest and the importance of doing things they enjoy post-discharge.   Goal Area(s) Addressed: Patient will identify leisure interests.  Patient will practice healthy decision making. Patient will engage in recreation activity.    Affect/Mood: Appropriate and Flat   Participation Level: Active and Engaged   Participation Quality: Independent   Behavior: Calm and Cooperative   Speech/Thought Process: Coherent   Insight: Good   Judgement: Good   Modes of Intervention: Cooperative Play   Patient Response to Interventions:  Receptive   Education Outcome:  Acknowledges education   Clinical Observations/Individualized Feedback: Natalie Edwards was active in their participation of session activities and group discussion. Pt minimally interacted with LRT and peers, however was pleasant and brightened upon approach.    Plan: Continue to engage patient in RT group sessions 2-3x/week.   Rosina Lowenstein, LRT, CTRS 12/25/2022 3:15 PM

## 2022-12-25 NOTE — Telephone Encounter (Signed)
Order placed in future Epic for Gastric Emptying Study, will call patient in 2 weeks, reminder also in place. If patient continues to have complaints of nausea and vomiting, notify of study.

## 2022-12-25 NOTE — Progress Notes (Signed)
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 Patient lost her mother approx a year ago and her job recently and is feeling depressed. Patient has been calm, cooperative, friendly with staff and peer. Performs her own ADL's and ambulates unassisted. Pt denies SI, HI, AVH, Anxiety and depression.  States she is feeling better and reports no issues. Plan is to discharge on Saturday.  Will continue to monitor.

## 2022-12-26 DIAGNOSIS — F332 Major depressive disorder, recurrent severe without psychotic features: Secondary | ICD-10-CM | POA: Diagnosis not present

## 2022-12-26 NOTE — B[facility] Counselor (Addendum)
CSW contacted Natalie Edwards, pt's son,  at [phone] to complete SPE.   CSW unable to reach, operator states "The party you have dialed has decided not to take calls from your number at this time"  CSW contacted Natalie Edwards (son) [phone]  Natalie Edwards states he feels pt needs to remain in the hospital longer.   [name] reports pt has "a lot going on. Her mental needs help"   [name] reports he feels she is a danger to herself, but not so much other.   [name] reports she has no access to guns or weapons but does have kitchen knives.   [name] reports concerns that  pt may get pills to overdose.   [name] states pt has a long history of threatening to harm herself.   [name] states "History of manipulating and lying. A lot of people don't trust her can't believe what she says"   [name] states pt needs to take accountability and fix the relationship with her ex-husband, sons and daughter.  CSW verbalized understanding.   CSW will inform provider.

## 2022-12-26 NOTE — Progress Notes (Signed)
   12/26/22 2300  Psych Admission Type (Psych Patients Only)  Admission Status Voluntary  Psychosocial Assessment  Patient Complaints Depression  Eye Contact Fair  Facial Expression Sullen  Affect Sad;Sullen  Speech Soft  Interaction Assertive  Motor Activity Slow  Appearance/Hygiene In scrubs  Behavior Characteristics Cooperative  Mood Depressed  Thought Process  Coherency WDL  Content WDL  Delusions None reported or observed  Perception WDL  Hallucination None reported or observed  Judgment WDL  Confusion None  Danger to Self  Current suicidal ideation? Denies ([place]eneis)  Agreement Not to Harm Self Yes  Description of Agreement verbal  Danger to Others  Danger to Others None reported or observed

## 2022-12-26 NOTE — Group Note (Signed)
Recreation Therapy Group Note   Group Topic:Other  Group Date: 12/26/2022 Start Time: 1400 End Time: 1450 Facilitators: Rosina Lowenstein, LRT, CTRS Location: Courtyard  Group Description: Outdoor Recreation. Patients had the option to play corn hole, ring toss, bowling or listening to music while outside in the courtyard getting fresh air and sunlight. Popular dance hit songs, like the Cha Cha Slide, were then played and patients were encouraged to join in. LRT and patients discussed things that they enjoy doing in their free time outside of the hospital. LRT and techs encouraged patients to drink water after being active and getting their heart rate up.   Goal Area(s) Addressed: Patient will identify leisure interests.  Patient will practice healthy decision making. Patient will engage in recreation activity.   Affect/Mood: Appropriate and Flat   Participation Level: Moderate   Participation Quality: Independent   Behavior: Calm and Cooperative   Speech/Thought Process: Coherent   Insight: Fair   Judgement: Good   Modes of Intervention: Activity   Patient Response to Interventions:  Receptive   Education Outcome:  Acknowledges education   Clinical Observations/Individualized Feedback: Natalie Edwards was mostly active in their participation of session activities and group discussion. Pt left group early and went inside, however interacted well with LRT and peers while present.    Plan: Continue to engage patient in RT group sessions 2-3x/week.   Rosina Lowenstein, LRT, CTRS 12/26/2022 3:22 PM

## 2022-12-26 NOTE — Group Note (Signed)
Date:  12/26/2022 Time:  11:23 AM  Group Topic/Focus:  Recovery Goals:   The focus of this group is to identify appropriate goals for recovery and establish a plan to achieve them.    Participation Level:  Active  Participation Quality:  Appropriate  Affect:  Appropriate  Cognitive:  Appropriate  Insight: Good  Engagement in Group:  Engaged  Modes of Intervention:  Education  Additional Comments:    Ardelle Anton 12/26/2022, 11:23 AM

## 2022-12-26 NOTE — Progress Notes (Signed)
[facility] MD Progress Note  12/26/2022 3:20 PM Natalie Edwards  MRN:  [id number] Subjective: Natalie Edwards is seen on rounds.  She likes the medication changes.  She said that she spoke with her son and it was a good conversation.  Social work also talked talked to her son who said that she could benefit from a longer stay in the hospital even though she currently denies being suicidal.  I told her about this and she said why did they want me to stay in the hospital and I said I thought you might know because I do not.  She still says that she wants to leave tomorrow so that she can go to social security as soon as possible. Principal Problem: MDD (major depressive disorder) Diagnosis: Principal Problem:   MDD (major depressive disorder)  Total Time spent with patient: 15 minutes  Past Psychiatric History: Depression  Past Medical History:  Past Medical History:  Diagnosis Date   Allergy    Anemia    Diverticulosis    Gall bladder stones 1993   Gastric ulcer    GERD (gastroesophageal reflux disease)    Hypertension    Renal mass     Past Surgical History:  Procedure Laterality Date   ABDOMINAL HYSTERECTOMY     BIOPSY  09/29/2022   Procedure: BIOPSY;  Surgeon: Napoleon Form, MD;  Location: [facility];  Service: Gastroenterology;;   CHOLECYSTECTOMY     ESOPHAGOGASTRODUODENOSCOPY (EGD) WITH PROPOFOL N/A 09/29/2022   Procedure: ESOPHAGOGASTRODUODENOSCOPY (EGD) WITH PROPOFOL;  Surgeon: Napoleon Form, MD;  Location: [facility];  Service: Gastroenterology;  Laterality: N/A;   Family History:  Family History  Problem Relation Age of Onset   Cancer Maternal Uncle        Lung   Cancer Maternal Uncle        Lung   Cancer Maternal Uncle        Lung   Headache Neg Hx        "I don't think so"   Migraines Neg Hx        "I don't think so"   Family Psychiatric  History: Unremarkable Social History:  Social History   Substance and Sexual Activity  Alcohol Use Yes   Comment:  occasionally - last drink was July 4, 1 shot     Social History   Substance and Sexual Activity  Drug Use No    Social History   Socioeconomic History   Marital status: Single    Spouse name: Not on file   Number of children: 3   Years of education: Not on file   Highest education level: High school graduate  Occupational History   Occupation: environmental services at [employer]  Tobacco Use   Smoking status: Every Day    Current packs/day: 0.50    Average packs/day: 0.5 packs/day for 25.0 years (12.5 ttl pk-yrs)    Types: Cigarettes   Smokeless tobacco: Never   Tobacco comments:    Pt tried to quit - helps with anxiety     1 pack last patient 3 days   Vaping Use   Vaping status: Never Used  Substance and Sexual Activity   Alcohol use: Yes    Comment: occasionally - last drink was July 4, 1 shot   Drug use: No   Sexual activity: Yes    Comment: hysterectomy  Other Topics Concern   Not on file  Social History Narrative   Lives at home in an apartment. Her mother lives with  her.    Right handed   Caffeine: drinks approx. 36 oz of pepsi per day. Sometimes drinks 2 cups of coffee in a day as well.    Social Determinants of Health   Financial Resource Strain: High Risk (05/22/2022)   Overall Financial Resource Strain (CARDIA)    Difficulty of Paying Living Expenses: Very hard  Food Insecurity: No Food Insecurity (12/23/2022)   Hunger Vital Sign    Worried About Running Out of Food in the Last Year: Never true    Ran Out of Food in the Last Year: Never true  Transportation Needs: No Transportation Needs (12/23/2022)   PRAPARE - Administrator, Civil Service (Medical): No    Lack of Transportation (Non-Medical): No  Physical Activity: Sufficiently Active (05/22/2022)   Exercise Vital Sign    Days of Exercise per Week: 4 days    Minutes of Exercise per Session: 70 min  Stress: Stress Concern Present (05/22/2022)   [employer] of Occupational Health -  Occupational Stress Questionnaire    Feeling of Stress : Very much  Social Connections: Moderately Isolated (05/22/2022)   Social Connection and Isolation Panel [NHANES]    Frequency of Communication with Friends and Family: More than three times a week    Frequency of Social Gatherings with Friends and Family: Never    Attends Religious Services: Never    Database administrator or Organizations: Yes    Attends Banker Meetings: Never    Marital Status: Never married   Additional Social History:                         Sleep: Good  Appetite:  Good  Current Medications: Current Facility-Administered Medications  Medication Dose Route Frequency Provider Last Rate Last Admin   acetaminophen (TYLENOL) tablet 650 mg  650 mg Oral Q6H PRN Jearld Lesch, NP   650 mg at 12/25/22 2106   alum & mag hydroxide-simeth (MAALOX/MYLANTA) 200-200-20 MG/5ML suspension 30 mL  30 mL Oral Q4H PRN Jearld Lesch, NP       amLODipine (NORVASC) tablet 5 mg  5 mg Oral Daily Sarina Ill, DO   5 mg at 12/26/22 1610   FLUoxetine (PROZAC) capsule 20 mg  20 mg Oral Daily Sarina Ill, DO   20 mg at 12/26/22 9604   LORazepam (ATIVAN) tablet 1 mg  1 mg Oral Q4H PRN Sarina Ill, DO       magnesium hydroxide (MILK OF MAGNESIA) suspension 30 mL  30 mL Oral Daily PRN Jearld Lesch, NP       mirtazapine (REMERON) tablet 15 mg  15 mg Oral QHS Sarina Ill, DO   15 mg at 12/25/22 2106   nicotine (NICODERM CQ - dosed in mg/24 hours) patch 14 mg  14 mg Transdermal Daily Sarina Ill, DO   14 mg at 12/26/22 0923   pantoprazole (PROTONIX) EC tablet 40 mg  40 mg Oral Daily Sarina Ill, DO   40 mg at 12/26/22 5409   polyethylene glycol (MIRALAX / GLYCOLAX) packet 17 g  17 g Oral Daily Sarina Ill, DO   17 g at 12/26/22 8119   pregabalin (LYRICA) capsule 75 mg  75 mg Oral BID Sarina Ill, DO   75 mg at 12/26/22  1478   risperiDONE (RISPERDAL) tablet 0.5 mg  0.5 mg Oral [id number] Sarina Ill, DO   0.5 mg at  12/26/22 0922   temazepam (RESTORIL) capsule 15 mg  15 mg Oral QHS PRN Foye Deer, RPH       traZODone (DESYREL) tablet 100 mg  100 mg Oral QHS PRN Sarina Ill, DO   100 mg at 12/25/22 2106    Lab Results: No results found for this or any previous visit (from the past 48 hour(s)).  Blood Alcohol level:  Lab Results  Component Value Date   ETH <10 12/22/2022    Metabolic Disorder Labs: Lab Results  Component Value Date   HGBA1C 5.7 (H) 12/13/2021   No results found for: "PROLACTIN" Lab Results  Component Value Date   CHOL 202 (H) 12/13/2021   TRIG 113 12/13/2021   HDL 76 12/13/2021   CHOLHDL 2.7 12/13/2021   LDLCALC 106 (H) 12/13/2021   LDLCALC 104 (H) 05/31/2018    Physical Findings: AIMS:  , ,  ,  ,    CIWA:    COWS:     Musculoskeletal: Strength & Muscle Tone: within normal limits Gait & Station: normal Patient leans: N/A  Psychiatric Specialty Exam:  Presentation  General Appearance:  Appropriate for Environment  Eye Contact: Fleeting  Speech: Clear and Coherent  Speech Volume: Normal  Handedness: Right   Mood and Affect  Mood: Depressed; Hopeless  Affect: Appropriate; Tearful   Thought Process  Thought Processes: Coherent  Descriptions of Associations:Intact  Orientation:Full (Time, Place and Person)  Thought Content:Logical  History of Schizophrenia/Schizoaffective disorder:No data recorded Duration of Psychotic Symptoms:No data recorded Hallucinations:No data recorded Ideas of Reference:None  Suicidal Thoughts:No data recorded Homicidal Thoughts:No data recorded  Sensorium  Memory: Immediate Good; Recent Good  Judgment: Poor  Insight: Fair   Art therapist  Concentration: Fair  Attention Span: Fair  Recall: [employer] of Knowledge: Fair  Language: Fair   Psychomotor  Activity  Psychomotor Activity:No data recorded  Assets  Assets: Communication Skills; Desire for Improvement; Social Support   Sleep  Sleep:No data recorded    Blood pressure 127/73, pulse 91, temperature 98.6 F (37 C), resp. rate 16, height 5\' 3"  (1.6 m), weight 77.8 kg, SpO2 100%. Body mass index is 30.38 kg/m.   Treatment Plan Summary: Daily contact with patient to assess and evaluate symptoms and progress in treatment, Medication management, and Plan continue current medication.  Angie Piercey Tresea Mall, DO 12/26/2022, 3:20 PM

## 2022-12-26 NOTE — Plan of Care (Signed)
  Problem: Nutrition: Goal: Adequate nutrition will be maintained Outcome: Progressing   Problem: Coping: Goal: Level of anxiety will decrease Outcome: Progressing   

## 2022-12-26 NOTE — Group Note (Signed)
Date:  12/25/2022 Time:  9:31 PM  Group Topic/Focus:  Making Healthy Choices:   The focus of this group is to help patients identify negative/unhealthy choices they were using prior to admission and identify positive/healthier coping strategies to replace them upon discharge.    Participation Level:  Active  Participation Quality:  Appropriate  Affect:  Appropriate  Cognitive:  Appropriate  Insight: Appropriate  Engagement in Group:  engaged  Modes of Intervention:  Education  Additional Comments:    Garry Heater 12/26/2022, 9:31 PM

## 2022-12-26 NOTE — Plan of Care (Signed)
  Problem: Education: Goal: Knowledge of General Education information will improve Description: Including pain rating scale, medication(s)/side effects and non-pharmacologic comfort measures Outcome: Progressing   Problem: Health Behavior/Discharge Planning: Goal: Ability to manage health-related needs will improve Outcome: Progressing   Problem: Nutrition: Goal: Adequate nutrition will be maintained Outcome: Progressing   

## 2022-12-26 NOTE — Plan of Care (Signed)
  Problem: Health Behavior/Discharge Planning: Goal: Ability to manage health-related needs will improve Outcome: Progressing   Problem: Clinical Measurements: Goal: Will remain free from infection Outcome: Progressing Goal: Diagnostic test results will improve Outcome: Progressing Goal: Respiratory complications will improve Outcome: Progressing Goal: Cardiovascular complication will be avoided Outcome: Progressing   Problem: Activity: Goal: Risk for activity intolerance will decrease Outcome: Progressing

## 2022-12-26 NOTE — Progress Notes (Signed)
   12/26/22 0558  15 Minute Checks  Location Bedroom  Visual Appearance Calm  Behavior Sleeping  Sleep (Behavioral Health Patients Only)  Calculate sleep? (Click Yes once per 24 hr at 0600 safety check) Yes  Documented sleep last 24 hours 9.5

## 2022-12-26 NOTE — Group Note (Signed)
Date:  12/26/2022 Time:  9:08 PM  Group Topic/Focus:  Personal Choices and Values:   The focus of this group is to help patients assess and explore the importance of values in their lives, how their values affect their decisions, how they express their values and what opposes their expression.    Participation Level:  Active  Participation Quality:  Appropriate  Affect:  Appropriate  Cognitive:  Appropriate  Insight: Good  Engagement in Group:  Engaged  Modes of Intervention:  Discussion  Additional Comments:    Burt Ek 12/26/2022, 9:08 PM

## 2022-12-26 NOTE — Progress Notes (Signed)
Patient admitted to the [facility] psych unit on December 23, 2022 for worsening depression and SI thoughts to overdose on pills. Patient is having difficulty dealing with life stressors and the upcoming 1 year anniversary of her mom's passing.  Patient denies SI/HI/AVH. She endorses depression. Engaged in active listening. She attended 2/2 groups and is visible in the milieu.  Discharge planning ongoing.  Q15 minute unit checks in place.

## 2022-12-27 DIAGNOSIS — F332 Major depressive disorder, recurrent severe without psychotic features: Secondary | ICD-10-CM | POA: Diagnosis not present

## 2022-12-27 MED ORDER — AMLODIPINE BESYLATE 5 MG PO TABS: 5.0000 mg | ORAL_TABLET | Freq: Every day | ORAL | 3 refills | Status: DC

## 2022-12-27 MED ORDER — PREGABALIN 75 MG PO CAPS: 75.0000 mg | ORAL_CAPSULE | Freq: Two times a day (BID) | ORAL | 0 refills | Status: DC

## 2022-12-27 MED ORDER — MIRTAZAPINE 15 MG PO TABS: 15.0000 mg | ORAL_TABLET | Freq: Every day | ORAL | 3 refills | Status: DC

## 2022-12-27 MED ORDER — RISPERIDONE 0.5 MG PO TABS: 0.5000 mg | ORAL_TABLET | ORAL | 3 refills | Status: DC

## 2022-12-27 MED ORDER — FLUOXETINE HCL 20 MG PO CAPS: 20.0000 mg | ORAL_CAPSULE | Freq: Every day | ORAL | 3 refills | Status: DC

## 2022-12-27 MED ORDER — PANTOPRAZOLE SODIUM 40 MG PO TBEC: 40.0000 mg | DELAYED_RELEASE_TABLET | Freq: Every day | ORAL | 3 refills | Status: DC

## 2022-12-27 MED ORDER — TRAZODONE HCL 100 MG PO TABS: 100.0000 mg | ORAL_TABLET | Freq: Every evening | ORAL | 3 refills | Status: DC | PRN

## 2022-12-27 NOTE — Discharge Summary (Signed)
Physician Discharge Summary Note  Patient:  Natalie Edwards is an 57 y.o., female MRN:  [id number] DOB:  October 13, 1965 Patient phone:  [phone] (home)  Patient address:   [address] [place] [address],  Total Time spent with patient: 15 minutes  Date of Admission:  12/23/2022 Date of Discharge: 12/27/2022  Reason for Admission:  Natalie Edwards is a 57 y.o. female patient comes in with chief complaint of suicidal ideation. Jaide informs me in a tearful manner, that she is depressed because her mom recently passed away and her grown children live up [place] but will not have her come stay with them.  She recently lost her job and was denied disability.  She does have a Therapist, sports and a therapist in [place].  She is currently on Restoril, Remeron and says that her medications are not helping.  She has been suicidal recently but currently denies being suicidal.  She denies any auditory or visual hallucinations.   Patient has history of chronic abdominal pain secondary to peptic ulcer disease, CKD, thrombocytopenia and chronic headaches. On evaluation Giovannina Spadea reports that she is about to lose her house, states she does not have a job anymore and times have been so rough right now.  She states that she was fired from her job in July 2024 because she took some days off, and attempted to get disability for her depression and was denied, and when she came back to her job they fired her.  She states she has no family support she lives by herself, she states her mother used to be her biggest supporter, but her mother passed away in 09-Feb-2022. She states that she has 2 children, they are aware of her situation but she does not try and bother them too much as they live in [place] and [place]. She states she has not really dealt with her mother's passing, as it has been very hard on her.  She states that she is having suicidal thoughts "off and on "she says that sometimes she feels  that it was better off if she was dead then being here on earth.  She denies HI/AVH.  She denies using any illicit drugs or alcohol, UDS is positive for benzodiazepines, as patient takes a temazepam at bedtime for sleep.  She states she has been admitted to an inpatient psychiatric facility about 10 years ago for depression.  She states she currently takes Belsomra 20 mg at bedtime, Prozac 20 mg daily, Remeron 45 mg at bedtime, and Lyrica 75 mg twice daily.  She states her diagnosis is depression and anxiety.  She says she is compliant with her medications but does not feel they are working properly.   During evaluation Nataliya Dalrymple is laying in bed in no acute distress. She is alert, oriented x 3, calm, cooperative and attentive. Her mood is depressed with congruent/ flat/ tearful affect. She has normal speech, and behavior.  Objectively there is no evidence of psychosis/mania or delusional thinking.  Patient is able to converse coherently, goal directed thoughts, no distractibility, or pre-occupation.  She denies self-harm/homicidal ideation, psychosis, and paranoia. She states that she is having suicidal thoughts "off and on "she says that sometimes she feels that it was better off if she was dead then being here on earth. Patient is a 34 -year-old female comes in with chief complaint of suicidal ideation. Patient has history of chronic abdominal pain secondary to peptic ulcer disease, CKD, thrombocytopenia and chronic headaches. She indicates  that she also has depression. Last year she lost her mother. She has been struggling with the loss. More recently, she had to leave her job and her disability was declined. In the interim, her bills have been piling up, adding to her stress. She feels helpless, hopeless and has had thoughts of hurting herself with overdosing on prescription pills. Patient has attempted to overdose in the past, that was 14 years ago. Patient states she currently sees [facility] for therapy and Aundra Millet prescribes her medications  Principal Problem: MDD (major depressive disorder) Discharge Diagnoses: Principal Problem:   MDD (major depressive disorder)   Past Psychiatric History: Depression  Past Medical History:  Past Medical History:  Diagnosis Date   Allergy    Anemia    Diverticulosis    Gall bladder stones 1993   Gastric ulcer    GERD (gastroesophageal reflux disease)    Hypertension    Renal mass     Past Surgical History:  Procedure Laterality Date   ABDOMINAL HYSTERECTOMY     BIOPSY  09/29/2022   Procedure: BIOPSY;  Surgeon: Napoleon Form, MD;  Location: [facility];  Service: Gastroenterology;;   CHOLECYSTECTOMY     ESOPHAGOGASTRODUODENOSCOPY (EGD) WITH PROPOFOL N/A 09/29/2022   Procedure: ESOPHAGOGASTRODUODENOSCOPY (EGD) WITH PROPOFOL;  Surgeon: Napoleon Form, MD;  Location: [facility];  Service: Gastroenterology;  Laterality: N/A;   Family History:  Family History  Problem Relation Age of Onset   Cancer Maternal Uncle        Lung   Cancer Maternal Uncle        Lung   Cancer Maternal Uncle        Lung   Headache Neg Hx        "I don't think so"   Migraines Neg Hx        "I don't think so"   Family Psychiatric  History: Unremarkable Social History:  Social History   Substance and Sexual Activity  Alcohol Use Yes   Comment: occasionally - last drink was July 4, 1 shot     Social History   Substance and Sexual Activity  Drug Use No    Social History   Socioeconomic History   Marital status: Single    Spouse name: Not on file   Number of children: 3   Years of education: Not on file   Highest education level: High school graduate  Occupational History   Occupation: environmental services at [employer]  Tobacco Use   Smoking status: Every Day    Current packs/day: 0.50    Average packs/day: 0.5 packs/day for 25.0 years (12.5 ttl pk-yrs)    Types: Cigarettes   Smokeless  tobacco: Never   Tobacco comments:    Pt tried to quit - helps with anxiety     1 pack last patient 3 days   Vaping Use   Vaping status: Never Used  Substance and Sexual Activity   Alcohol use: Yes    Comment: occasionally - last drink was July 4, 1 shot   Drug use: No   Sexual activity: Yes    Comment: hysterectomy  Other Topics Concern   Not on file  Social History Narrative   Lives at home in an apartment. Her mother lives with her.    Right handed   Caffeine: drinks approx. 36 oz of pepsi per day. Sometimes drinks 2 cups of coffee in a day as well.    Social Determinants of Health  Financial Resource Strain: High Risk (05/22/2022)   Overall Financial Resource Strain (CARDIA)    Difficulty of Paying Living Expenses: Very hard  Food Insecurity: No Food Insecurity (12/23/2022)   Hunger Vital Sign    Worried About Running Out of Food in the Last Year: Never true    Ran Out of Food in the Last Year: Never true  Transportation Needs: No Transportation Needs (12/23/2022)   PRAPARE - Administrator, Civil Service (Medical): No    Lack of Transportation (Non-Medical): No  Physical Activity: Sufficiently Active (05/22/2022)   Exercise Vital Sign    Days of Exercise per Week: 4 days    Minutes of Exercise per Session: 70 min  Stress: Stress Concern Present (05/22/2022)   [employer] of Occupational Health - Occupational Stress Questionnaire    Feeling of Stress : Very much  Social Connections: Moderately Isolated (05/22/2022)   Social Connection and Isolation Panel [NHANES]    Frequency of Communication with Friends and Family: More than three times a week    Frequency of Social Gatherings with Friends and Family: Never    Attends Religious Services: Never    Database administrator or Organizations: Yes    Attends Banker Meetings: Never    Marital Status: Never married    Hospital Course: Fayson is a 57 year old African-American female who is  voluntarily admitted to inpatient psychiatry for worsening depression and suicidal ideation.  She recently lost her job was turned down for disability.  Her family has not been very supportive of her and she presented tearful.  I started her on Prozac Risperdal and trazodone and her mood improved greatly.  She denied any side effects.  She was pleasant and cooperative and participated in individual and group therapy.  She felt like the medications were very helpful.  She wanted to leave so that she can go attend Social Security office as soon as possible.  It was felt that she maximized hospitalization she was discharged home.  On the day of discharge she denied suicidal ideation, homicidal ideation, auditory or visual hallucinations.  Her judgment and insight were good.  Physical Findings: AIMS:  , ,  ,  ,    CIWA:    COWS:     Musculoskeletal: Strength & Muscle Tone: within normal limits Gait & Station: normal Patient leans: N/A   Psychiatric Specialty Exam:  Presentation  General Appearance:  Appropriate for Environment  Eye Contact: Fleeting  Speech: Clear and Coherent  Speech Volume: Normal  Handedness: Right   Mood and Affect  Mood: Depressed; Hopeless  Affect: Appropriate; Tearful   Thought Process  Thought Processes: Coherent  Descriptions of Associations:Intact  Orientation:Full (Time, Place and Person)  Thought Content:Logical  History of Schizophrenia/Schizoaffective disorder:No data recorded Duration of Psychotic Symptoms:No data recorded Hallucinations:No data recorded Ideas of Reference:None  Suicidal Thoughts:No data recorded Homicidal Thoughts:No data recorded  Sensorium  Memory: Immediate Good; Recent Good  Judgment: Poor  Insight: Fair   Art therapist  Concentration: Fair  Attention Span: Fair  Recall: [employer] of Knowledge: Fair  Language: Fair   Psychomotor Activity  Psychomotor Activity:No data  recorded  Assets  Assets: Communication Skills; Desire for Improvement; Social Support   Sleep  Sleep:No data recorded   Physical Exam: Physical Exam Vitals and nursing note reviewed.  Constitutional:      Appearance: Normal appearance. She is normal weight.  Neurological:     General: No focal deficit present.  Mental Status: She is alert and oriented to person, place, and time.  Psychiatric:        Attention and Perception: Attention and perception normal.        Mood and Affect: Mood and affect normal.        Speech: Speech normal.        Behavior: Behavior normal. Behavior is cooperative.        Thought Content: Thought content normal.        Cognition and Memory: Cognition and memory normal.        Judgment: Judgment normal.    Review of Systems  Constitutional: Negative.   HENT: Negative.    Eyes: Negative.   Respiratory: Negative.    Cardiovascular: Negative.   Gastrointestinal: Negative.   Genitourinary: Negative.   Musculoskeletal: Negative.   Skin: Negative.   Neurological: Negative.   Endo/Heme/Allergies: Negative.   Psychiatric/Behavioral: Negative.     Blood pressure 121/73, pulse 86, temperature 98.8 F (37.1 C), resp. rate 16, height 5\' 3"  (1.6 m), weight 77.8 kg, SpO2 97%. Body mass index is 30.38 kg/m.   Social History   Tobacco Use  Smoking Status Every Day   Current packs/day: 0.50   Average packs/day: 0.5 packs/day for 25.0 years (12.5 ttl pk-yrs)   Types: Cigarettes  Smokeless Tobacco Never  Tobacco Comments   Pt tried to quit - helps with anxiety    1 pack last patient 3 days    Tobacco Cessation:  A prescription for an FDA-approved tobacco cessation medication was offered at discharge and the patient refused   Blood Alcohol level:  Lab Results  Component Value Date   ETH <10 12/22/2022    Metabolic Disorder Labs:  Lab Results  Component Value Date   HGBA1C 5.7 (H) 12/13/2021   No results found for: "PROLACTIN" Lab  Results  Component Value Date   CHOL 202 (H) 12/13/2021   TRIG 113 12/13/2021   HDL 76 12/13/2021   CHOLHDL 2.7 12/13/2021   LDLCALC 106 (H) 12/13/2021   LDLCALC 104 (H) 05/31/2018    See Psychiatric Specialty Exam and Suicide Risk Assessment completed by Attending Physician prior to discharge.  Discharge destination:  Home  Is patient on multiple antipsychotic therapies at discharge:  No   Has Patient had three or more failed trials of antipsychotic monotherapy by history:  No  Recommended Plan for Multiple Antipsychotic Therapies: NA   Allergies as of 12/27/2022       Reactions   Hydroxyzine Other (See Comments)   Palpitations and jitteriness    Ambien [zolpidem Tartrate] Other (See Comments)   Jitteriness, nervousness, abdominal pain        Medication List     STOP taking these medications    acetaminophen 325 MG tablet Commonly known as: TYLENOL   amLODipine-benazepril 10-40 MG capsule Commonly known as: LOTREL   Belsomra 20 MG Tabs Generic drug: Suvorexant       TAKE these medications      Indication  amLODipine 5 MG tablet Commonly known as: NORVASC Take 1 tablet (5 mg total) by mouth daily. Start taking on: December 28, 2022  Indication: High Blood Pressure   dicyclomine 20 MG tablet Commonly known as: BENTYL Take 1 tablet (20 mg total) by mouth 2 (two) times daily as needed for spasms.    diphenhydramine-acetaminophen 25-500 MG Tabs tablet Commonly known as: TYLENOL PM Take 1 tablet by mouth at bedtime as needed (pain).    FLUoxetine 20 MG capsule Commonly  known as: PROZAC Take 1 capsule (20 mg total) by mouth daily. Start taking on: December 28, 2022  Indication: Major Depressive Disorder   hydrOXYzine 50 MG tablet Commonly known as: ATARAX Take 50 mg by mouth 3 (three) times daily as needed.    loratadine 10 MG tablet Commonly known as: CLARITIN Take 1 tablet (10 mg total) by mouth daily as needed for allergies.    mirtazapine 15  MG tablet Commonly known as: REMERON Take 1 tablet (15 mg total) by mouth at bedtime. What changed:  medication strength how much to take  Indication: Major Depressive Disorder   ondansetron 4 MG tablet Commonly known as: ZOFRAN Take 1 tablet (4 mg total) by mouth every 8 (eight) hours as needed for nausea.    pantoprazole 40 MG tablet Commonly known as: PROTONIX Take 1 tablet (40 mg total) by mouth daily. Start taking on: December 28, 2022 What changed: additional instructions  Indication: Gastroesophageal Reflux Disease   polyethylene glycol 17 g packet Commonly known as: MIRALAX / GLYCOLAX Take 17 g by mouth daily as needed for mild constipation.    pregabalin 75 MG capsule Commonly known as: LYRICA Take 1 capsule (75 mg total) by mouth 2 (two) times daily.  Indication: Generalized Anxiety Disorder, Neuropathic Pain   risperiDONE 0.5 MG tablet Commonly known as: RISPERDAL Take 1 tablet (0.5 mg total) by mouth 2 (two) times daily at 8 am and 4 pm.  Indication: Major Depressive Disorder   temazepam 30 MG capsule Commonly known as: RESTORIL Take 1 capsule (30 mg total) by mouth at bedtime as needed.    traZODone 100 MG tablet Commonly known as: DESYREL Take 1 tablet (100 mg total) by mouth at bedtime as needed for sleep.  Indication: Trouble Sleeping, Major Depressive Disorder        Follow-up Information     [facility]. Go on 12/31/2022.   Specialty: Behavioral Health Why: Your appointment is scheduled for 12/31/22 via telehealth at 5:30 PM. Contact information: [address]  [place] [address] [phone]                 Follow-up recommendations: [facility]    Signed: Sarina Ill, DO 12/27/2022, 11:23 AM

## 2022-12-27 NOTE — Plan of Care (Signed)
  Problem: Education: Goal: Knowledge of General Education information will improve Description: Including pain rating scale, medication(s)/side effects and non-pharmacologic comfort measures Outcome: Progressing   Problem: Health Behavior/Discharge Planning: Goal: Ability to make decisions will improve Outcome: Progressing Goal: Compliance with therapeutic regimen will improve Outcome: Progressing   Problem: Self-Concept: Goal: Level of anxiety will decrease Outcome: Progressing

## 2022-12-27 NOTE — Care Management Important Message (Signed)
Important Message  Patient Details  Name: Natalie Edwards MRN: [id number] Date of Birth: Mar 25, 1965   Important Message Given:  Yes - Medicare IM     Elza Rafter, LCSWA 12/27/2022, 10:51 AM

## 2022-12-27 NOTE — Progress Notes (Signed)
Discharge Note:  Patient denies SI/HI/AVH at this time. Discharge instructions, AVS, prescriptions, and transition reviewed with patient. Patient agrees to comply with medication management, follow-up visit, and outpatient therapy. Patient belongings returned to patient. Patient questions and concerns addressed and answered. Patient ambulatory off unit. Patient discharged to home with taxi voucher.   Suicide Safety Plan completed.

## 2022-12-27 NOTE — [facility] Suicide Risk Assessment (Signed)
[facility] INPATIENT:  Family/Significant Other Suicide Prevention Education  Suicide Prevention Education:  Education Completed; Soniya Ashraf, [phone]  ,  has been identified by the patient as the family member/significant other with whom the patient will be residing, and identified as the person(s) who will aid the patient in the event of a mental health crisis (suicidal ideations/suicide attempt).  With written consent from the patient, the family member/significant other has been provided the following suicide prevention education, prior to the and/or following the discharge of the patient.  The suicide prevention education provided includes the following: Suicide risk factors Suicide prevention and interventions National Suicide Hotline telephone number [facility] assessment telephone number [facility] Emergency Assistance 911 [facility] and/or Residential Mobile Crisis Unit telephone number  Request made of family/significant other to: Remove weapons (e.g., guns, rifles, knives), all items previously/currently identified as safety concern.   Remove drugs/medications (over-the-counter, prescriptions, illicit drugs), all items previously/currently identified as a safety concern.  The family member/significant other verbalizes understanding of the suicide prevention education information provided.  The family member/significant other agrees to remove the items of safety concern listed above.  Elza Rafter 12/27/2022, 10:59 AM

## 2022-12-27 NOTE — [facility] Suicide Risk Assessment (Signed)
[facility] Discharge Suicide Risk Assessment   Principal Problem: MDD (major depressive disorder) Discharge Diagnoses: Principal Problem:   MDD (major depressive disorder)   Total Time spent with patient: 1 hour  Musculoskeletal: Strength & Muscle Tone: within normal limits Gait & Station: normal Patient leans: N/A  Psychiatric Specialty Exam  Presentation  General Appearance:  Appropriate for Environment  Eye Contact: Fleeting  Speech: Clear and Coherent  Speech Volume: Normal  Handedness: Right   Mood and Affect  Mood: Depressed; Hopeless  Duration of Depression Symptoms: No data recorded Affect: Appropriate; Tearful   Thought Process  Thought Processes: Coherent  Descriptions of Associations:Intact  Orientation:Full (Time, Place and Person)  Thought Content:Logical  History of Schizophrenia/Schizoaffective disorder:No data recorded Duration of Psychotic Symptoms:No data recorded Hallucinations:No data recorded Ideas of Reference:None  Suicidal Thoughts:No data recorded Homicidal Thoughts:No data recorded  Sensorium  Memory: Immediate Good; Recent Good  Judgment: Poor  Insight: Fair   Art therapist  Concentration: Fair  Attention Span: Fair  Recall: [employer] of Knowledge: Fair  Language: Fair   Psychomotor Activity  Psychomotor Activity:No data recorded  Assets  Assets: Communication Skills; Desire for Improvement; Social Support   Sleep  Sleep:No data recorded   Blood pressure 121/73, pulse 86, temperature 98.8 F (37.1 C), resp. rate 16, height 5\' 3"  (1.6 m), weight 77.8 kg, SpO2 97%. Body mass index is 30.38 kg/m.  Mental Status Per Nursing Assessment::   On Admission:  NA  Demographic Factors:  Low socioeconomic status, Living alone, and Unemployed  Loss Factors: NA  Historical Factors: NA  Risk Reduction Factors:   NA  Cognitive Features That Contribute To Risk:  None    Suicide Risk:   Minimal: No identifiable suicidal ideation.  Patients presenting with no risk factors but with morbid ruminations; may be classified as minimal risk based on the severity of the depressive symptoms   Follow-up Information     [facility]. Go on 12/31/2022.   Specialty: Behavioral Health Why: Your appointment is scheduled for 12/31/22 via telehealth at 5:30 PM. Contact information: [address]  [place] [address] [phone]                 Plan Of Care/Follow-up recommendations: [facility]   Sarina Ill, DO 12/27/2022, 11:15 AM

## 2022-12-27 NOTE — [facility] Counselor (Signed)
Pt was given blank IM note due to not being scanned in by medical records at this time.   Reynaldo Minium, MSW, [facility] 12/27/2022 10:52 AM

## 2022-12-27 NOTE — Progress Notes (Signed)
  [facility] Adult Case Management Discharge Plan :  Will you be returning to the same living situation after discharge:  Yes,  pt will return home  At discharge, do you have transportation home?: Yes,  CSW will assist with transportation  Do you have the ability to pay for your medications: Yes,  [employer] MEDICARE / Suan Halter DUAL COMPLETE  Release of information consent forms completed and in the chart;  Patient's signature needed at discharge.  Patient to Follow up at:  Follow-up Information     [facility]. Go on 12/31/2022.   Specialty: Behavioral Health Why: Your appointment is scheduled for 12/31/22 via telehealth at 5:30 PM. Contact information: [address]  [address] [place] [address] [phone]                 Next level of care provider has access to [facility] Link:no  Safety Planning and Suicide Prevention discussed: Yes,  Daryll Ines Bloomer      Has patient been referred to the Quitline?: Patient refused referral for treatment  Patient has been referred for addiction treatment: Patient refused referral for treatment.  [address], LCSWA 12/27/2022, 10:53 AM

## 2022-12-27 NOTE — Progress Notes (Signed)
Patient pleasant and cooperative.  Present in the dayroom for breakfast.  Animated affect.  Denies SI/HI and AVH.  Denies feelings of anxiety and depression. Denies pain.  Reports she slept well.  Good appetite.   Compliant with scheduled medications. 15 min checks in place for safety.  Present in the milieu.  Appropriate interaction with peers and staff.

## 2023-01-05 ENCOUNTER — Telehealth: Payer: Self-pay | Admitting: Family Medicine

## 2023-01-05 NOTE — Telephone Encounter (Signed)
Copy of disablility has been sent through mail

## 2023-01-05 NOTE — Telephone Encounter (Signed)
Reached out to pt to let her know we do have a copy of her disability claim she is requesting. Pt was distraught in phone call; is going through an emotional turmoil due to her job  denying disability claim and firing her. Pt also stated she is about to lose her apartment due to non-payment. Pt discussed thoughts of self-harm. Discussed with pt about scheduling an appt with Marton Redwood, for resources. Please advise.

## 2023-01-05 NOTE — Telephone Encounter (Signed)
Copied from CRM [id number]. Topic: General - Other >> Jan 05, 2023  1:24 PM Ja-Kwan M wrote: Reason for CRM: Pt requests call back to advise if the office has a copy of her disability paperwork. Cb# [phone]

## 2023-01-06 ENCOUNTER — Ambulatory Visit: Payer: Self-pay | Admitting: *Deleted

## 2023-01-07 ENCOUNTER — Ambulatory Visit: Payer: Self-pay | Admitting: *Deleted

## 2023-01-07 NOTE — Patient Instructions (Signed)
Visit Information  Thank you for taking time to visit with me today. Please don't hesitate to contact me if I can be of assistance to you.   Following are the goals we discussed today:   Goals Addressed             This Visit's Progress    Reduce stress related to financial strains        Activities and task to complete in order to accomplish goals.   Call [employer] hotline "211" and local churches, DSS and other organizations to seek assistance with back rent owed  Await further word from [facility] Disability   Follow up with housing resources discussed (sent you an email for resource/support to prevent your eviction) Call [facility] [phone] to schedule your counseling appointment/paperwork completed. Follow up on medical appointments: Hematology, GI, etc Call [facility] program discussed- # provided for eviction assistance Consider part-time work you may be able to do that is less physical           Our next appointment is by telephone on 01/12/23    Please call the care guide team at [phone] if you need to cancel or reschedule your appointment.   If you are experiencing a Mental Health or Behavioral Health Crisis or need someone to talk to, please call the Suicide and Crisis Lifeline: 988 call 911   The patient verbalized understanding of instructions, educational materials, and care plan provided today and DECLINED offer to receive copy of patient instructions, educational materials, and care plan.   Telephone follow up appointment with care management team member scheduled for:01/12/23  Reece Levy, MSW, LCSW [facility]  [facility], [facility] Licensed Clinical Social Worker Care Coordinator  [phone]

## 2023-01-07 NOTE — Patient Outreach (Signed)
Care Coordination   Follow Up Visit Note Late Entry 01/06/23  01/07/2023 Name: Natalie Edwards MRN: [id number] DOB: 1965/04/18  Natalie Edwards is a 57 y.o. year old female who sees [name], MD for primary care. I spoke with  [name] by phone today.  What matters to the patients health and wellness today?  Need to get help paying my back rent by Monday or being evicted.    Goals Addressed             This Visit's Progress    Reduce stress related to financial strains        Activities and task to complete in order to accomplish goals.   Call [employer] hotline "211" and local churches, DSS and other organizations to seek assistance with back rent owed  Await further word from [facility] Disability   Follow up with housing resources discussed (sent you an email for resource/support to prevent your eviction) Call [facility] [phone] to schedule your counseling appointment/paperwork completed. Follow up on medical appointments: Hematology, GI, etc Call [facility] program discussed- # provided for eviction assistance Consider part-time work you may be able to do that is less physical           SDOH assessments and interventions completed:  Yes     Care Coordination Interventions:  Yes, provided  Interventions Today    Flowsheet Row Most Recent Value  Chronic Disease   Chronic disease during today's visit Other  [stress/depression]  General Interventions   General Interventions Discussed/Reviewed [employer]  [Provided and encouraged pt to call local churches, DSS, etc to inquire about rent assistance]  Mental Health Interventions   Mental Health Discussed/Reviewed Coping Strategies  [Pt feeling stress due to current state of not being able to backpay her rent ($2000) owed and landlord giving her until Monday 01/12/23 before eviction to be pursued. No SI/]  Safety Interventions   Safety Discussed/Reviewed Safety Discussed  [Pt concerned about her well-being/safety with  potential loss of home soon-]       Follow up plan: Follow up call scheduled for 01/12/23    Encounter Outcome:  Patient Visit Completed

## 2023-01-07 NOTE — Telephone Encounter (Signed)
Received, she stated that she already has a therapist that she visits once a week. I will send her more some resources via MyChart to assist with finances.

## 2023-01-07 NOTE — Patient Outreach (Signed)
Care Coordination   Follow Up Visit Note   01/08/2023 Name: Natalie Edwards MRN: [id number] DOB: 1966/02/27  Natalie Edwards is a 57 y.o. year old female who sees Natalie Skeans, MD for primary care. I spoke with  Natalie Edwards by phone today.  What matters to the patients health and wellness today? Still not able to pay back rent and may be evicted soon.    Goals Addressed             This Visit's Progress    Reduce stress related to financial strains        Activities and task to complete in order to accomplish goals.   Call [employer] hotline "211" and local churches, DSS and other organizations provided to you to seek assistance with back rent owed  Await further word from [facility] Disability   Follow up with housing resources discussed (sent you an email for resource/support to prevent your eviction) Call [facility] [phone] to schedule your counseling appointment/paperwork completed. Follow up on medical appointments: Hematology, GI, etc Call [facility] program discussed- # provided for eviction assistance Consider part-time work you may be able to do that is less physical           SDOH assessments and interventions completed:  Yes     Care Coordination Interventions:  Yes, provided    Interventions Today    Flowsheet Row Most Recent Value  General Interventions   General Interventions Discussed/Reviewed [employer]  [Pt continues to seek housing assistance with her past due $2000 for rent. CSW provided pt with numerous agencies to call and seek assistance.]  Mental Health Interventions   Mental Health Discussed/Reviewed Mental Health Discussed  [Pt is frustrated and anxious about her housing dilemma- considering options and may move back to [place] she shares.]        Follow up plan: Follow up call scheduled for 01/12/23    Encounter Outcome:  Patient Visit Completed

## 2023-01-07 NOTE — Telephone Encounter (Signed)
Noted  

## 2023-01-08 ENCOUNTER — Telehealth: Payer: Self-pay | Admitting: Family Medicine

## 2023-01-08 NOTE — Telephone Encounter (Signed)
We don't have a Ann Held herein this office

## 2023-01-08 NOTE — Telephone Encounter (Signed)
Called patient today to see if she is still having issues with N&V; patient states she is continuing to have those same issues. Informed the patient that Willette Cluster, NP has ordered a gastric emptying study if those same issues persist. Patient is agreeable to the study, but has informed the nurse that she is in the process of being evicted, doesn't know exactly when, but will be moving back to [place]. Ms. Wilmon Pali, [place] is not available at this time.

## 2023-01-08 NOTE — Telephone Encounter (Signed)
Noted  

## 2023-01-08 NOTE — Telephone Encounter (Signed)
Copied from CRM [id number]. Topic: General - Other >> Jan 07, 2023  2:35 PM Everette C wrote: Reason for CRM: The patient has returned a missed call from Ann Held   Please contact further when possible

## 2023-01-08 NOTE — Patient Instructions (Signed)
Visit Information  Thank you for taking time to visit with me today. Please don't hesitate to contact me if I can be of assistance to you.   Following are the goals we discussed today:   Goals Addressed             This Visit's Progress    Reduce stress related to financial strains        Activities and task to complete in order to accomplish goals.   Call [employer] hotline "211" and local churches, DSS and other organizations provided to you to seek assistance with back rent owed  Await further word from [facility] Disability   Follow up with housing resources discussed (sent you an email for resource/support to prevent your eviction) Call [facility] [phone] to schedule your counseling appointment/paperwork completed. Follow up on medical appointments: Hematology, GI, etc Call [facility] program discussed- # provided for eviction assistance Consider part-time work you may be able to do that is less physical           Our next appointment is by telephone on 01/12/23  Please call the care guide team at [phone] if you need to cancel or reschedule your appointment.   If you are experiencing a Mental Health or Behavioral Health Crisis or need someone to talk to, please call the Suicide and Crisis Lifeline: 988 call 911   The patient verbalized understanding of instructions, educational materials, and care plan provided today and DECLINED offer to receive copy of patient instructions, educational materials, and care plan.   Telephone follow up appointment with care management team member scheduled for:01/12/23  [facility] Housing Authority to apply for Section 8 housing [phone] [place] [employer] Program: [phone]  [employer] (home repairs. Must own your own home and meet income requirements) - [phone] [employer] (for persons experiencing homelessness) [phone] www.nchousingsearch.org Greater Dietitian (select  "find help") - Find Food  [place], [place] (ghpfa.org) or download the free app to your smart phone      Reece Levy, MSW, LCSW [facility]  [facility], [facility] Licensed Clinical Social Worker Care Coordinator  [phone]

## 2023-01-08 NOTE — Telephone Encounter (Signed)
Scheduling has been notified of the need to schedule the patient for the gastric emptying study. Patient will decide if or when she can do the study due to circumstances of possible eviction. Called patient to notify that scheduling will be calling her to schedule the appt and if she needs to, she can call back and cancel at any time. Patient understands and agreed. Patient also asked for Medical Records to obtain the records to take when she leaves. Medical Records phone number given to patient at [phone].

## 2023-01-12 ENCOUNTER — Ambulatory Visit: Payer: Self-pay | Admitting: *Deleted

## 2023-01-13 ENCOUNTER — Telehealth: Payer: Self-pay | Admitting: Family Medicine

## 2023-01-13 NOTE — Patient Outreach (Addendum)
Care Coordination   Follow Up Visit Note   01/13/2023 Name: Natalie Edwards MRN: [id number] DOB: 1966-02-11  Natalie Edwards is a 57 y.o. year old female who sees [name], MD for primary care. I spoke with  [name] by phone today.  What matters to the patients health and wellness today? " Going to move back to [place] as soon as I get the money to do so"    Goals Addressed             This Visit's Progress    Reduce stress related to financial strains        Activities and task to complete in order to accomplish goals.   Continue to consider your options  Call [employer] hotline "211" and local churches, DSS and other organizations provided to you to seek assistance with back rent owed  Await further word from [facility] Disability   Follow up with housing resources discussed (sent you an email for resource/support to prevent your eviction) Call [facility] [phone] to schedule your counseling appointment/paperwork completed. Follow up on medical appointments: Hematology, GI, etc Call [facility] program discussed- # provided for eviction assistance Consider part-time work you may be able to do that is less physical           SDOH assessments and interventions completed:  Yes     Care Coordination Interventions:  Yes, provided  Interventions Today    Flowsheet Row Most Recent Value  Chronic Disease   Chronic disease during today's visit Other  [stress/depression]  General Interventions   General Interventions Discussed/Reviewed [employer]  [Pt reports hse had called all the resources offered and suggested and no help with rent.]  Mental Health Interventions   Mental Health Discussed/Reviewed Mental Health Discussed, Mental Health Reviewed, Coping Strategies, Crisis  [Pt doing ok- has decided to move back to [place] where family is and seek housing there. She anticipates her Landlord filing eviction papers as Monday was the deadline]  Safety Interventions   Safety  Discussed/Reviewed Safety Discussed  [Pt reminded of "988" number to call for mental health support 24/7]       Follow up plan: Follow up call scheduled for 01/16/23    Encounter Outcome:  Patient Visit Completed

## 2023-01-13 NOTE — Telephone Encounter (Signed)
Copied from CRM [id number]. Topic: General - Inquiry >> Jan 13, 2023 10:25 AM Haroldine Laws wrote: Reason for CRM: pt called asking if her FMLA papers could be printed out so she can come by and pick them up for her hearing.  She needs them asap.

## 2023-01-13 NOTE — Telephone Encounter (Signed)
Patient is coming to pick up copy of paperwork

## 2023-01-15 ENCOUNTER — Encounter (HOSPITAL_COMMUNITY): Payer: 59

## 2023-01-16 ENCOUNTER — Other Ambulatory Visit: Payer: Self-pay

## 2023-01-16 ENCOUNTER — Emergency Department (HOSPITAL_COMMUNITY)
Admission: EM | Admit: 2023-01-16 | Discharge: 2023-01-16 | Disposition: A | Discharge status: Home / Self Care | Payer: 59 | Source: Home / Self Care | Attending: Emergency Medicine | Admitting: Emergency Medicine

## 2023-01-16 ENCOUNTER — Encounter: Payer: Self-pay | Admitting: *Deleted

## 2023-01-16 ENCOUNTER — Emergency Department (HOSPITAL_COMMUNITY): Payer: 59

## 2023-01-16 DIAGNOSIS — Z9049 Acquired absence of other specified parts of digestive tract: Secondary | ICD-10-CM | POA: Diagnosis not present

## 2023-01-16 DIAGNOSIS — F17210 Nicotine dependence, cigarettes, uncomplicated: Secondary | ICD-10-CM | POA: Diagnosis not present

## 2023-01-16 DIAGNOSIS — D72829 Elevated white blood cell count, unspecified: Secondary | ICD-10-CM | POA: Diagnosis not present

## 2023-01-16 DIAGNOSIS — I10 Essential (primary) hypertension: Secondary | ICD-10-CM | POA: Insufficient documentation

## 2023-01-16 DIAGNOSIS — R1012 Left upper quadrant pain: Secondary | ICD-10-CM | POA: Insufficient documentation

## 2023-01-16 DIAGNOSIS — Z79899 Other long term (current) drug therapy: Secondary | ICD-10-CM | POA: Insufficient documentation

## 2023-01-16 DIAGNOSIS — R6889 Other general symptoms and signs: Secondary | ICD-10-CM | POA: Diagnosis not present

## 2023-01-16 DIAGNOSIS — R634 Abnormal weight loss: Secondary | ICD-10-CM | POA: Diagnosis not present

## 2023-01-16 DIAGNOSIS — Z66 Do not resuscitate: Secondary | ICD-10-CM | POA: Diagnosis not present

## 2023-01-16 DIAGNOSIS — G4489 Other headache syndrome: Secondary | ICD-10-CM | POA: Diagnosis not present

## 2023-01-16 DIAGNOSIS — K219 Gastro-esophageal reflux disease without esophagitis: Secondary | ICD-10-CM | POA: Insufficient documentation

## 2023-01-16 DIAGNOSIS — G8929 Other chronic pain: Secondary | ICD-10-CM | POA: Diagnosis not present

## 2023-01-16 DIAGNOSIS — D75839 Thrombocytosis, unspecified: Secondary | ICD-10-CM | POA: Diagnosis not present

## 2023-01-16 DIAGNOSIS — G4700 Insomnia, unspecified: Secondary | ICD-10-CM | POA: Diagnosis not present

## 2023-01-16 DIAGNOSIS — I499 Cardiac arrhythmia, unspecified: Secondary | ICD-10-CM | POA: Diagnosis not present

## 2023-01-16 DIAGNOSIS — K5900 Constipation, unspecified: Secondary | ICD-10-CM | POA: Diagnosis not present

## 2023-01-16 DIAGNOSIS — R109 Unspecified abdominal pain: Secondary | ICD-10-CM | POA: Diagnosis not present

## 2023-01-16 DIAGNOSIS — R1110 Vomiting, unspecified: Secondary | ICD-10-CM | POA: Diagnosis not present

## 2023-01-16 DIAGNOSIS — R1013 Epigastric pain: Secondary | ICD-10-CM | POA: Diagnosis not present

## 2023-01-16 DIAGNOSIS — E872 Acidosis, unspecified: Secondary | ICD-10-CM | POA: Diagnosis not present

## 2023-01-16 DIAGNOSIS — Z87442 Personal history of urinary calculi: Secondary | ICD-10-CM | POA: Diagnosis not present

## 2023-01-16 DIAGNOSIS — R110 Nausea: Secondary | ICD-10-CM | POA: Insufficient documentation

## 2023-01-16 DIAGNOSIS — N281 Cyst of kidney, acquired: Secondary | ICD-10-CM | POA: Diagnosis not present

## 2023-01-16 DIAGNOSIS — N179 Acute kidney failure, unspecified: Secondary | ICD-10-CM | POA: Diagnosis not present

## 2023-01-16 DIAGNOSIS — R112 Nausea with vomiting, unspecified: Secondary | ICD-10-CM | POA: Diagnosis not present

## 2023-01-16 DIAGNOSIS — K3184 Gastroparesis: Secondary | ICD-10-CM | POA: Diagnosis not present

## 2023-01-16 DIAGNOSIS — K5730 Diverticulosis of large intestine without perforation or abscess without bleeding: Secondary | ICD-10-CM | POA: Diagnosis not present

## 2023-01-16 DIAGNOSIS — Z888 Allergy status to other drugs, medicaments and biological substances status: Secondary | ICD-10-CM | POA: Diagnosis not present

## 2023-01-16 DIAGNOSIS — R000 Tachycardia, unspecified: Secondary | ICD-10-CM | POA: Diagnosis not present

## 2023-01-16 LAB — COMPREHENSIVE METABOLIC PANEL
ALT: 19 U/L (ref 0–44)
AST: 17 U/L (ref 15–41)
Albumin: 4 g/dL (ref 3.5–5.0)
Alkaline Phosphatase: 56 U/L (ref 38–126)
Anion gap: 10 (ref 5–15)
BUN: 11 mg/dL (ref 6–20)
CO2: 24 mmol/L (ref 22–32)
Calcium: 9.4 mg/dL (ref 8.9–10.3)
Chloride: 106 mmol/L (ref 98–111)
Creatinine, Ser: 0.92 mg/dL (ref 0.44–1.00)
GFR, Estimated: >60 mL/min (ref >60)
Glucose, Bld: 88 mg/dL (ref 70–99)
Potassium: 4.6 mmol/L (ref 3.5–5.1)
Sodium: 140 mmol/L (ref 135–145)
Total Bilirubin: 0.6 mg/dL (ref 0.3–1.2)
Total Protein: 8 g/dL (ref 6.5–8.1)

## 2023-01-16 LAB — CBC WITH DIFFERENTIAL/PLATELET
Abs Immature Granulocytes: 0.04 10*3/uL (ref 0.00–0.07)
Basophils Absolute: 0 10*3/uL (ref 0.0–0.1)
Basophils Relative: 0 %
Eosinophils Absolute: 0.1 10*3/uL (ref 0.0–0.5)
Eosinophils Relative: 1 %
HCT: 37.9 % (ref 36.0–46.0)
Hemoglobin: 12.2 g/dL (ref 12.0–15.0)
Immature Granulocytes: 0 %
Lymphocytes Relative: 36 %
Lymphs Abs: 4.1 10*3/uL — ABNORMAL HIGH (ref 0.7–4.0)
MCH: 27.2 pg (ref 26.0–34.0)
MCHC: 32.2 g/dL (ref 30.0–36.0)
MCV: 84.4 fL (ref 80.0–100.0)
Monocytes Absolute: 0.8 10*3/uL (ref 0.1–1.0)
Monocytes Relative: 7 %
Neutro Abs: 6.4 10*3/uL (ref 1.7–7.7)
Neutrophils Relative %: 56 %
Platelets: 495 10*3/uL — ABNORMAL HIGH (ref 150–400)
RBC: 4.49 MIL/uL (ref 3.87–5.11)
RDW: 18.3 % — ABNORMAL HIGH (ref 11.5–15.5)
WBC: 11.5 10*3/uL — ABNORMAL HIGH (ref 4.0–10.5)
nRBC: 0 % (ref 0.0–0.2)

## 2023-01-16 LAB — URINALYSIS, ROUTINE W REFLEX MICROSCOPIC
Bilirubin Urine: NEGATIVE
Glucose, UA: NEGATIVE mg/dL
Hgb urine dipstick: NEGATIVE
Ketones, ur: NEGATIVE mg/dL
Leukocytes,Ua: NEGATIVE
Nitrite: NEGATIVE
Protein, ur: NEGATIVE mg/dL
Specific Gravity, Urine: 1.01 (ref 1.005–1.030)
pH: 5 (ref 5.0–8.0)

## 2023-01-16 LAB — LIPASE, BLOOD: Lipase: 28 U/L (ref 11–51)

## 2023-01-16 MED ORDER — ONDANSETRON 8 MG PO TBDP
8.0000 mg | ORAL_TABLET | Freq: Once | ORAL | Status: AC
  Administered 2023-01-16: 8 mg via ORAL
  Filled 2023-01-16: qty 1

## 2023-01-16 MED ORDER — ONDANSETRON 4 MG PO TBDP: 4.0000 mg | ORAL_TABLET | Freq: Three times a day (TID) | ORAL | 0 refills | Status: DC | PRN

## 2023-01-16 MED ORDER — ONDANSETRON 4 MG PO TBDP
4.0000 mg | ORAL_TABLET | Freq: Once | ORAL | Status: AC
  Administered 2023-01-16: 4 mg via ORAL
  Filled 2023-01-16: qty 1

## 2023-01-16 MED ORDER — DICYCLOMINE HCL 10 MG/ML IM SOLN
20.0000 mg | Freq: Once | INTRAMUSCULAR | Status: AC
  Administered 2023-01-16: 20 mg via INTRAMUSCULAR
  Filled 2023-01-16: qty 2

## 2023-01-16 MED ORDER — MORPHINE SULFATE (PF) 4 MG/ML IV SOLN
4.0000 mg | Freq: Once | INTRAVENOUS | Status: AC
  Administered 2023-01-16: 4 mg via INTRAVENOUS
  Filled 2023-01-16: qty 1

## 2023-01-16 MED ORDER — DICYCLOMINE HCL 20 MG PO TABS: 20.0000 mg | ORAL_TABLET | Freq: Two times a day (BID) | ORAL | 0 refills | Status: DC

## 2023-01-16 NOTE — Discharge Instructions (Addendum)
As discussed, your labs and imaging are reassuring.  Take Bentyl for abdominal pain and Zofran for nausea as needed. Follow up with your GI specialist for reevaluation.  Get help right away if: You cannot stop vomiting. Your pain is only in one part of your belly, like on the right side. You have bloody or black poop, or poop that looks like tar. You have trouble breathing. You have chest pain.

## 2023-01-16 NOTE — ED Triage Notes (Signed)
PT BIB EMS coming from home, c/o abd x 3 days. With nausea and HA. Pain 10/10 w/th tenderness left side. Denies vomiting, diarrhea. LBM was couple of days ago. Took tylenol last night w/ no relief. Hx: diverticulitis and IBS.   BP 109/80, HR 83, Spo2 94%, CBG 104 20g Left hand- 100 mcg Fentanyl given en route

## 2023-01-16 NOTE — ED Notes (Addendum)
Pt ambulated to restroom. 

## 2023-01-16 NOTE — ED Provider Notes (Signed)
[place] EMERGENCY DEPARTMENT AT [facility] Provider Note   CSN: [id number] Arrival date & time: 01/16/23  4098     History  Chief Complaint  Patient presents with   Abdominal Pain   Nausea    Natalie Edwards is a 58 y.o. female with a history of hypertension, gastric ulcer, and GERD presents the ED today for abdominal pain.  Patient reports left-sided abdominal pain for the past 3 days with associated nausea.  No fever, diarrhea, or vomiting.  She has tried Tylenol without any relief.  Reports history of gastric ulcers and diverticulitis in the past and states that this pain feels similar.  No additional complaints or concerns at this time.    Home Medications Prior to Admission medications   Medication Sig Start Date End Date Taking? Authorizing Provider  dicyclomine (BENTYL) 20 MG tablet Take 1 tablet (20 mg total) by mouth 2 (two) times daily for 14 days. 01/16/23 01/30/23 Yes Maxwell Marion, PA-C  ondansetron (ZOFRAN-ODT) 4 MG disintegrating tablet Take 1 tablet (4 mg total) by mouth every 8 (eight) hours as needed for nausea or vomiting. 01/16/23 02/15/23 Yes Maxwell Marion, PA-C  amLODipine (NORVASC) 5 MG tablet Take 1 tablet (5 mg total) by mouth daily. 12/28/22   Sarina Ill, DO  diphenhydramine-acetaminophen (TYLENOL PM) 25-500 MG TABS tablet Take 1 tablet by mouth at bedtime as needed (pain).    [provider]  FLUoxetine (PROZAC) 20 MG capsule Take 1 capsule (20 mg total) by mouth daily. 12/28/22   Sarina Ill, DO  hydrOXYzine (ATARAX) 50 MG tablet Take 50 mg by mouth 3 (three) times daily as needed.    [provider]  loratadine (CLARITIN) 10 MG tablet Take 1 tablet (10 mg total) by mouth daily as needed for allergies. 09/24/22   Bing Neighbors, NP  mirtazapine (REMERON) 15 MG tablet Take 1 tablet (15 mg total) by mouth at bedtime. 12/27/22   Sarina Ill, DO  ondansetron (ZOFRAN) 4 MG tablet Take 1 tablet (4  mg total) by mouth every 8 (eight) hours as needed for nausea. 12/16/22   Meredith Pel, NP  pantoprazole (PROTONIX) 40 MG tablet Take 1 tablet (40 mg total) by mouth daily. 12/28/22   Sarina Ill, DO  polyethylene glycol (MIRALAX / GLYCOLAX) 17 g packet Take 17 g by mouth daily as needed for mild constipation. 09/30/22   Marguerita Merles Latif, DO  pregabalin (LYRICA) 75 MG capsule Take 1 capsule (75 mg total) by mouth 2 (two) times daily. 12/27/22   Sarina Ill, DO  risperiDONE (RISPERDAL) 0.5 MG tablet Take 1 tablet (0.5 mg total) by mouth 2 (two) times daily at 8 am and 4 pm. 12/27/22   Sarina Ill, DO  temazepam (RESTORIL) 30 MG capsule Take 1 capsule (30 mg total) by mouth at bedtime as needed. 11/04/22   Tomma Lightning, MD  traZODone (DESYREL) 100 MG tablet Take 1 tablet (100 mg total) by mouth at bedtime as needed for sleep. 12/27/22   Sarina Ill, DO      Allergies    Hydroxyzine and Ambien [zolpidem tartrate]    Review of Systems   Review of Systems  Gastrointestinal:  Positive for abdominal pain.  All other systems reviewed and are negative.   Physical Exam Updated Vital Signs BP (!) 143/80 (BP Location: Right Arm)   Pulse 74   Temp 97.7 F (36.5 C) (Oral)   Resp 18   Ht 5'  4" (1.626 m)   Wt 78.5 kg   SpO2 99%   BMI 29.71 kg/m  Physical Exam Vitals and nursing note reviewed.  Constitutional:      Appearance: Normal appearance.  HENT:     Head: Normocephalic and atraumatic.     Mouth/Throat:     Mouth: Mucous membranes are moist.  Eyes:     Conjunctiva/sclera: Conjunctivae normal.     Pupils: Pupils are equal, round, and reactive to light.  Cardiovascular:     Rate and Rhythm: Normal rate and regular rhythm.     Pulses: Normal pulses.  Pulmonary:     Effort: Pulmonary effort is normal.     Breath sounds: Normal breath sounds.  Abdominal:     General: There is no distension.     Palpations: Abdomen is soft.      Tenderness: There is abdominal tenderness. There is no right CVA tenderness, left CVA tenderness, guarding or rebound.     Comments: Epigastric and left-sided tenderness to palpation  Skin:    General: Skin is warm and dry.     Findings: No rash.  Neurological:     General: No focal deficit present.     Mental Status: She is alert.     Motor: No weakness.  Psychiatric:        Mood and Affect: Mood normal.        Behavior: Behavior normal.     ED Results / Procedures / Treatments   Labs (all labs ordered are listed, but only abnormal results are displayed) Labs Reviewed  CBC WITH DIFFERENTIAL/PLATELET - Abnormal; Notable for the following components:      Result Value   WBC 11.5 (*)    RDW 18.3 (*)    Platelets 495 (*)    Lymphs Abs 4.1 (*)    All other components within normal limits  COMPREHENSIVE METABOLIC PANEL  LIPASE, BLOOD  URINALYSIS, ROUTINE W REFLEX MICROSCOPIC    EKG None  Radiology DG Abdomen Acute W/Chest  Result Date: 01/16/2023 CLINICAL DATA:  Abdominal pain and vomiting for 2 days EXAM: DG ABDOMEN ACUTE WITH 1 VIEW CHEST COMPARISON:  Abdominal x-ray series 10/24/2022 and older CT scans. FINDINGS: Hyperinflation. No consolidation, pneumothorax or effusion. No edema. Normal cardiopericardial silhouette. Gas seen in nondilated loops of small and large bowel. Scattered stool. Presumed colonic diverticula. Air towards the rectum. Surgical clips in the right upper quadrant. No definite free air seen beneath the diaphragm on the upright view. IMPRESSION: No acute cardiopulmonary disease. Nonspecific bowel gas pattern. Scattered stool and presumed diverticula. Previous cholecystectomy Electronically Signed   By: Karen Kays M.D.   On: 01/16/2023 13:29    Procedures Procedures: not indicated.   Medications Ordered in ED Medications  ondansetron (ZOFRAN-ODT) disintegrating tablet 4 mg (4 mg Oral Given 01/16/23 1047)  morphine (PF) 4 MG/ML injection 4 mg (4 mg  Intravenous Given 01/16/23 1110)  ondansetron (ZOFRAN-ODT) disintegrating tablet 8 mg (8 mg Oral Given 01/16/23 1435)  dicyclomine (BENTYL) injection 20 mg (20 mg Intramuscular Given 01/16/23 1436)    ED Course/ Medical Decision Making/ A&P                                 Medical Decision Making Amount and/or Complexity of Data Reviewed Labs: ordered. Radiology: ordered.  Risk Prescription drug management.   This patient presents to the ED for concern of abdominal pain, this involves an extensive number  of treatment options, and is a complaint that carries with it a high risk of complications and morbidity.   Differential diagnosis includes: Gastritis, gastroenteritis, pancreatitis, diverticulitis, IBS, IBD, diverticulitis, bowel obstruction, perforation, etc.   Comorbidities  See HPI above   Additional History  Additional history obtained from prior records.   Lab Tests  I ordered and personally interpreted labs.  The pertinent results include:   CMP, CBC, and lipase are within normal limits - no acute anemia, electrolyte abnormality, AKI, or transaminitis.   Imaging Studies  I ordered imaging studies including abdominal series x-ray I independently visualized and interpreted imaging which showed: nonspecific bowel gas pattern. Scattered stool and presumed diverticula. Previous cholecystectomy. I agree with the radiologist interpretation   Problem List / ED Course / Critical Interventions / Medication Management  Abdominal pain x3 days I ordered medications including: Morphine and Zofran for pain and nausea Reevaluation of the patient after these medicines showed that the patient somewhat improved. I then ordered Bentyl and Zofran.  Patient reports feeling a lot better on reevaluation. I have reviewed the patients home medicines and have made adjustments as needed   Social Determinants of Health  Tobacco use   Test / Admission - Considered  Discussed  findings with patient.  She is hemodynamically stable and safe for discharge home. Return precautions provided.       Final Clinical Impression(s) / ED Diagnoses Final diagnoses:  Left upper quadrant abdominal pain    Rx / DC Orders ED Discharge Orders          Ordered    ondansetron (ZOFRAN-ODT) 4 MG disintegrating tablet  Every 8 hours PRN        01/16/23 1448    dicyclomine (BENTYL) 20 MG tablet  2 times daily        01/16/23 1448              Maxwell Marion, PA-C 01/16/23 1503    Derwood Kaplan, MD 01/17/23 0825

## 2023-01-18 ENCOUNTER — Encounter (HOSPITAL_COMMUNITY): Payer: Self-pay | Admitting: Family Medicine

## 2023-01-18 ENCOUNTER — Inpatient Hospital Stay (HOSPITAL_COMMUNITY)
Admission: EM | Admit: 2023-01-18 | Discharge: 2023-01-22 | DRG: 392 | Disposition: A | Discharge status: Home / Self Care | Payer: 59 | Attending: Internal Medicine | Admitting: Internal Medicine

## 2023-01-18 ENCOUNTER — Other Ambulatory Visit: Payer: Self-pay

## 2023-01-18 ENCOUNTER — Emergency Department (HOSPITAL_COMMUNITY): Payer: 59

## 2023-01-18 DIAGNOSIS — G4700 Insomnia, unspecified: Secondary | ICD-10-CM | POA: Diagnosis present

## 2023-01-18 DIAGNOSIS — I10 Essential (primary) hypertension: Secondary | ICD-10-CM | POA: Diagnosis present

## 2023-01-18 DIAGNOSIS — R634 Abnormal weight loss: Secondary | ICD-10-CM | POA: Diagnosis present

## 2023-01-18 DIAGNOSIS — Z87442 Personal history of urinary calculi: Secondary | ICD-10-CM

## 2023-01-18 DIAGNOSIS — F32A Depression, unspecified: Secondary | ICD-10-CM | POA: Diagnosis present

## 2023-01-18 DIAGNOSIS — K5900 Constipation, unspecified: Secondary | ICD-10-CM | POA: Diagnosis present

## 2023-01-18 DIAGNOSIS — Z90710 Acquired absence of both cervix and uterus: Secondary | ICD-10-CM

## 2023-01-18 DIAGNOSIS — Z9049 Acquired absence of other specified parts of digestive tract: Secondary | ICD-10-CM

## 2023-01-18 DIAGNOSIS — D75839 Thrombocytosis, unspecified: Secondary | ICD-10-CM | POA: Diagnosis present

## 2023-01-18 DIAGNOSIS — R1012 Left upper quadrant pain: Secondary | ICD-10-CM

## 2023-01-18 DIAGNOSIS — R112 Nausea with vomiting, unspecified: Secondary | ICD-10-CM | POA: Diagnosis not present

## 2023-01-18 DIAGNOSIS — D72829 Elevated white blood cell count, unspecified: Secondary | ICD-10-CM | POA: Diagnosis present

## 2023-01-18 DIAGNOSIS — Z66 Do not resuscitate: Secondary | ICD-10-CM | POA: Diagnosis present

## 2023-01-18 DIAGNOSIS — N179 Acute kidney failure, unspecified: Secondary | ICD-10-CM | POA: Diagnosis present

## 2023-01-18 DIAGNOSIS — K219 Gastro-esophageal reflux disease without esophagitis: Secondary | ICD-10-CM | POA: Diagnosis present

## 2023-01-18 DIAGNOSIS — Z6829 Body mass index (BMI) 29.0-29.9, adult: Secondary | ICD-10-CM

## 2023-01-18 DIAGNOSIS — Z79899 Other long term (current) drug therapy: Secondary | ICD-10-CM

## 2023-01-18 DIAGNOSIS — F419 Anxiety disorder, unspecified: Secondary | ICD-10-CM | POA: Diagnosis present

## 2023-01-18 DIAGNOSIS — F5104 Psychophysiologic insomnia: Secondary | ICD-10-CM | POA: Diagnosis present

## 2023-01-18 DIAGNOSIS — F17210 Nicotine dependence, cigarettes, uncomplicated: Secondary | ICD-10-CM | POA: Diagnosis present

## 2023-01-18 DIAGNOSIS — Z888 Allergy status to other drugs, medicaments and biological substances status: Secondary | ICD-10-CM

## 2023-01-18 DIAGNOSIS — E872 Acidosis, unspecified: Principal | ICD-10-CM | POA: Diagnosis present

## 2023-01-18 DIAGNOSIS — K3184 Gastroparesis: Principal | ICD-10-CM | POA: Diagnosis present

## 2023-01-18 LAB — URINALYSIS, ROUTINE W REFLEX MICROSCOPIC
Bilirubin Urine: NEGATIVE
Glucose, UA: NEGATIVE mg/dL
Hgb urine dipstick: NEGATIVE
Ketones, ur: 20 mg/dL — AB
Leukocytes,Ua: NEGATIVE
Nitrite: NEGATIVE
Protein, ur: NEGATIVE mg/dL
Specific Gravity, Urine: 1.042 — ABNORMAL HIGH (ref 1.005–1.030)
pH: 5 (ref 5.0–8.0)

## 2023-01-18 LAB — COMPREHENSIVE METABOLIC PANEL
ALT: 16 U/L (ref 0–44)
AST: 16 U/L (ref 15–41)
Albumin: 3.9 g/dL (ref 3.5–5.0)
Alkaline Phosphatase: 56 U/L (ref 38–126)
Anion gap: 16 — ABNORMAL HIGH (ref 5–15)
BUN: 13 mg/dL (ref 6–20)
CO2: 17 mmol/L — ABNORMAL LOW (ref 22–32)
Calcium: 9.7 mg/dL (ref 8.9–10.3)
Chloride: 105 mmol/L (ref 98–111)
Creatinine, Ser: 1.44 mg/dL — ABNORMAL HIGH (ref 0.44–1.00)
GFR, Estimated: 42 mL/min — ABNORMAL LOW (ref >60)
Glucose, Bld: 74 mg/dL (ref 70–99)
Potassium: 4 mmol/L (ref 3.5–5.1)
Sodium: 138 mmol/L (ref 135–145)
Total Bilirubin: 0.7 mg/dL (ref 0.3–1.2)
Total Protein: 7.8 g/dL (ref 6.5–8.1)

## 2023-01-18 LAB — RAPID URINE DRUG SCREEN, HOSP PERFORMED
Amphetamines: NOT DETECTED
Barbiturates: NOT DETECTED
Benzodiazepines: POSITIVE — AB
Cocaine: NOT DETECTED
Opiates: POSITIVE — AB
Tetrahydrocannabinol: NOT DETECTED

## 2023-01-18 LAB — CBC
HCT: 37.5 % (ref 36.0–46.0)
Hemoglobin: 12.4 g/dL (ref 12.0–15.0)
MCH: 27.5 pg (ref 26.0–34.0)
MCHC: 33.1 g/dL (ref 30.0–36.0)
MCV: 83.1 fL (ref 80.0–100.0)
Platelets: 471 10*3/uL — ABNORMAL HIGH (ref 150–400)
RBC: 4.51 MIL/uL (ref 3.87–5.11)
RDW: 18 % — ABNORMAL HIGH (ref 11.5–15.5)
WBC: 11.3 10*3/uL — ABNORMAL HIGH (ref 4.0–10.5)
nRBC: 0 % (ref 0.0–0.2)

## 2023-01-18 LAB — LIPASE, BLOOD: Lipase: 22 U/L (ref 11–51)

## 2023-01-18 LAB — TSH: TSH: 1.202 u[IU]/mL (ref 0.350–4.500)

## 2023-01-18 LAB — MAGNESIUM: Magnesium: 1.7 mg/dL (ref 1.7–2.4)

## 2023-01-18 MED ORDER — TRAZODONE HCL 50 MG PO TABS
100.0000 mg | ORAL_TABLET | Freq: Every evening | ORAL | Status: DC | PRN
Start: 2023-01-18 — End: 2023-01-22
  Administered 2023-01-20 – 2023-01-21 (×2): 100 mg via ORAL
  Filled 2023-01-18 (×2): qty 2

## 2023-01-18 MED ORDER — DICYCLOMINE HCL 20 MG PO TABS
20.0000 mg | ORAL_TABLET | Freq: Two times a day (BID) | ORAL | Status: DC
  Administered 2023-01-18 – 2023-01-22 (×8): 20 mg via ORAL
  Filled 2023-01-18 (×9): qty 1

## 2023-01-18 MED ORDER — FLUOXETINE HCL 20 MG PO CAPS
20.0000 mg | ORAL_CAPSULE | Freq: Every day | ORAL | Status: DC
Start: 2023-01-19 — End: 2023-01-22
  Administered 2023-01-19 – 2023-01-22 (×4): 20 mg via ORAL
  Filled 2023-01-18 (×4): qty 1

## 2023-01-18 MED ORDER — ACETAMINOPHEN 650 MG RE SUPP: 650.0000 mg | Freq: Four times a day (QID) | RECTAL | Status: DC | PRN

## 2023-01-18 MED ORDER — TEMAZEPAM 7.5 MG PO CAPS: 30.0000 mg | ORAL_CAPSULE | Freq: Every evening | ORAL | Status: DC | PRN

## 2023-01-18 MED ORDER — SODIUM CHLORIDE 0.9 % IV BOLUS
500.0000 mL | Freq: Once | INTRAVENOUS | Status: AC
  Administered 2023-01-18: 500 mL via INTRAVENOUS

## 2023-01-18 MED ORDER — ONDANSETRON HCL 4 MG/2ML IJ SOLN
4.0000 mg | Freq: Four times a day (QID) | INTRAMUSCULAR | Status: DC | PRN
  Administered 2023-01-19 – 2023-01-20 (×2): 4 mg via INTRAVENOUS
  Filled 2023-01-18 (×2): qty 2

## 2023-01-18 MED ORDER — ACETAMINOPHEN 325 MG PO TABS
650.0000 mg | ORAL_TABLET | Freq: Four times a day (QID) | ORAL | Status: DC | PRN
  Filled 2023-01-18: qty 2

## 2023-01-18 MED ORDER — AMLODIPINE BESYLATE 5 MG PO TABS
5.0000 mg | ORAL_TABLET | Freq: Every day | ORAL | Status: DC
Start: 2023-01-19 — End: 2023-01-22
  Administered 2023-01-19 – 2023-01-22 (×4): 5 mg via ORAL
  Filled 2023-01-18 (×4): qty 1

## 2023-01-18 MED ORDER — MORPHINE SULFATE (PF) 2 MG/ML IV SOLN
2.0000 mg | INTRAVENOUS | Status: DC | PRN
  Administered 2023-01-18 – 2023-01-20 (×7): 2 mg via INTRAVENOUS
  Filled 2023-01-18 (×7): qty 1

## 2023-01-18 MED ORDER — SODIUM CHLORIDE 0.9 % IV SOLN: INTRAVENOUS | Status: DC

## 2023-01-18 MED ORDER — PANTOPRAZOLE SODIUM 40 MG IV SOLR
40.0000 mg | Freq: Once | INTRAVENOUS | Status: AC
  Administered 2023-01-18: 40 mg via INTRAVENOUS
  Filled 2023-01-18: qty 10

## 2023-01-18 MED ORDER — ONDANSETRON HCL 4 MG PO TABS
4.0000 mg | ORAL_TABLET | Freq: Four times a day (QID) | ORAL | Status: DC | PRN
  Administered 2023-01-20 (×2): 4 mg via ORAL
  Filled 2023-01-18 (×2): qty 1

## 2023-01-18 MED ORDER — ONDANSETRON HCL 4 MG/2ML IJ SOLN
4.0000 mg | Freq: Once | INTRAMUSCULAR | Status: AC
  Administered 2023-01-18: 4 mg via INTRAVENOUS
  Filled 2023-01-18: qty 2

## 2023-01-18 MED ORDER — MORPHINE SULFATE (PF) 4 MG/ML IV SOLN: 4.0000 mg | Freq: Once | INTRAVENOUS | Status: DC

## 2023-01-18 MED ORDER — HYDROXYZINE HCL 25 MG PO TABS
50.0000 mg | ORAL_TABLET | Freq: Two times a day (BID) | ORAL | Status: DC | PRN
  Filled 2023-01-18: qty 2

## 2023-01-18 MED ORDER — MIRTAZAPINE 15 MG PO TABS
15.0000 mg | ORAL_TABLET | Freq: Every day | ORAL | Status: DC
Start: 2023-01-18 — End: 2023-01-22
  Administered 2023-01-18 – 2023-01-21 (×4): 15 mg via ORAL
  Filled 2023-01-18 (×4): qty 1

## 2023-01-18 MED ORDER — MORPHINE SULFATE (PF) 4 MG/ML IV SOLN
4.0000 mg | Freq: Once | INTRAVENOUS | Status: AC
  Administered 2023-01-18: 4 mg via INTRAVENOUS
  Filled 2023-01-18: qty 1

## 2023-01-18 MED ORDER — NICOTINE 14 MG/24HR TD PT24
14.0000 mg | MEDICATED_PATCH | Freq: Every day | TRANSDERMAL | Status: DC
  Administered 2023-01-18 – 2023-01-22 (×5): 14 mg via TRANSDERMAL
  Filled 2023-01-18 (×5): qty 1

## 2023-01-18 MED ORDER — POLYETHYLENE GLYCOL 3350 17 G PO PACK
17.0000 g | PACK | Freq: Every day | ORAL | Status: DC | PRN
  Administered 2023-01-19: 17 g via ORAL
  Filled 2023-01-18: qty 1

## 2023-01-18 MED ORDER — DICYCLOMINE HCL 10 MG/ML IM SOLN
20.0000 mg | Freq: Once | INTRAMUSCULAR | Status: AC
  Administered 2023-01-18: 20 mg via INTRAMUSCULAR
  Filled 2023-01-18: qty 2

## 2023-01-18 MED ORDER — PANTOPRAZOLE SODIUM 40 MG IV SOLR
40.0000 mg | Freq: Two times a day (BID) | INTRAVENOUS | Status: DC
  Administered 2023-01-18 – 2023-01-21 (×7): 40 mg via INTRAVENOUS
  Filled 2023-01-18 (×7): qty 10

## 2023-01-18 MED ORDER — ENOXAPARIN SODIUM 40 MG/0.4ML IJ SOSY
40.0000 mg | PREFILLED_SYRINGE | INTRAMUSCULAR | Status: DC
  Administered 2023-01-18 – 2023-01-21 (×4): 40 mg via SUBCUTANEOUS
  Filled 2023-01-18 (×4): qty 0.4

## 2023-01-18 MED ORDER — IOHEXOL 350 MG/ML SOLN
75.0000 mL | Freq: Once | INTRAVENOUS | Status: AC | PRN
  Administered 2023-01-18: 75 mL via INTRAVENOUS

## 2023-01-18 MED ORDER — TEMAZEPAM 7.5 MG PO CAPS
30.0000 mg | ORAL_CAPSULE | Freq: Every day | ORAL | Status: DC
  Administered 2023-01-18 – 2023-01-21 (×4): 30 mg via ORAL
  Filled 2023-01-18 (×4): qty 4

## 2023-01-18 MED ORDER — PREGABALIN 75 MG PO CAPS
75.0000 mg | ORAL_CAPSULE | Freq: Two times a day (BID) | ORAL | Status: DC
  Administered 2023-01-18 – 2023-01-22 (×8): 75 mg via ORAL
  Filled 2023-01-18 (×8): qty 1

## 2023-01-18 MED ORDER — METOCLOPRAMIDE HCL 5 MG/ML IJ SOLN
5.0000 mg | Freq: Two times a day (BID) | INTRAMUSCULAR | Status: DC
  Administered 2023-01-18 – 2023-01-22 (×9): 5 mg via INTRAVENOUS
  Filled 2023-01-18 (×9): qty 2

## 2023-01-18 MED ORDER — DROPERIDOL 2.5 MG/ML IJ SOLN
1.2500 mg | Freq: Once | INTRAMUSCULAR | Status: AC
  Administered 2023-01-18: 1.25 mg via INTRAVENOUS
  Filled 2023-01-18: qty 2

## 2023-01-18 NOTE — Assessment & Plan Note (Signed)
Creatinine of 1.44 in setting of poor PO intake x 4 days and intractable nausea/vomiting  Received small IVF bolus in continue time limited IVF since can not tolerate oral intake Avoid nephrotoxic drugs Trend

## 2023-01-18 NOTE — Assessment & Plan Note (Addendum)
Continue restoril PRN

## 2023-01-18 NOTE — ED Notes (Signed)
Pt Was able to hold down the fluids wo n/v.

## 2023-01-18 NOTE — Assessment & Plan Note (Signed)
Continue prozac, remeron and hydroxyzine prn

## 2023-01-18 NOTE — ED Notes (Signed)
ED TO INPATIENT HANDOFF REPORT  ED Nurse Name and Phone #: [phone] Dorris Pierre y   S Name/Age/Gender Natalie Edwards 57 y.o. female Room/Bed: 032C/032C  Code Status   Code Status: Prior  Home/SNF/Other Home Patient oriented to: self, place, time, and situation Is this baseline? Yes   Triage Complete: Triage complete  Chief Complaint Intractable nausea and vomiting [R11.2]  Triage Note Pt BIB [facility] from home d/t abd pain, nausea and vomiting. Pt was seen at [facility] for same 2 days ago, reported no improvements. Denies cp or SOB. A&O X4.   Hr170/110 Cbg 90  HR 100     Allergies Allergies  Allergen Reactions   Hydroxyzine Other (See Comments)    Palpitations and jitteriness    Ambien [Zolpidem Tartrate] Other (See Comments)    Jitteriness, nervousness, abdominal pain    Level of Care/Admitting Diagnosis ED Disposition     ED Disposition  Admit   Condition  --   Comment  Hospital Area: [facility] [[id number]]  Level of Care: Med-Surg [16]  May place patient in observation at [facility] or [facility] if equivalent level of care is available:: Yes  Covid Evaluation: Asymptomatic - no recent exposure (last 10 days) testing not required  Diagnosis: Intractable nausea and vomiting [720114]  Admitting Physician: Orland Mustard [[id number]]  Attending Physician: Orland Mustard [[id number]]          B Medical/Surgery History Past Medical History:  Diagnosis Date   Allergy    Anemia    Diverticulosis    Gall bladder stones 1993   Gastric ulcer    GERD (gastroesophageal reflux disease)    Hypertension    Renal mass    Past Surgical History:  Procedure Laterality Date   ABDOMINAL HYSTERECTOMY     BIOPSY  09/29/2022   Procedure: BIOPSY;  Surgeon: Napoleon Form, MD;  Location: [facility] ENDOSCOPY;  Service: Gastroenterology;;   CHOLECYSTECTOMY     ESOPHAGOGASTRODUODENOSCOPY (EGD) WITH PROPOFOL N/A 09/29/2022   Procedure: ESOPHAGOGASTRODUODENOSCOPY (EGD)  WITH PROPOFOL;  Surgeon: Napoleon Form, MD;  Location: [facility] ENDOSCOPY;  Service: Gastroenterology;  Laterality: N/A;     A IV Location/Drains/Wounds Patient Lines/Drains/Airways Status     Active Line/Drains/Airways     Name Placement date Placement time Site Days   Peripheral IV 01/18/23 20 G Anterior;Proximal;Right Forearm 01/18/23  0844  Forearm  less than 1            Intake/Output Last 24 hours No intake or output data in the 24 hours ending 01/18/23 1438  Labs/Imaging Results for orders placed or performed during the hospital encounter of 01/18/23 (from the past 48 hour(s))  Comprehensive metabolic panel     Status: Abnormal   Collection Time: 01/18/23  8:47 AM  Result Value Ref Range   Sodium 138 135 - 145 mmol/L   Potassium 4.0 3.5 - 5.1 mmol/L   Chloride 105 98 - 111 mmol/L   CO2 17 (L) 22 - 32 mmol/L   Glucose, Bld 74 70 - 99 mg/dL    Comment: Glucose reference range applies only to samples taken after fasting for at least 8 hours.   BUN 13 6 - 20 mg/dL   Creatinine, Ser 6.38 (H) 0.44 - 1.00 mg/dL   Calcium 9.7 8.9 - 75.6 mg/dL   Total Protein 7.8 6.5 - 8.1 g/dL   Albumin 3.9 3.5 - 5.0 g/dL   AST 16 15 - 41 U/L   ALT 16 0 - 44 U/L  Alkaline Phosphatase 56 38 - 126 U/L   Total Bilirubin 0.7 0.3 - 1.2 mg/dL   GFR, Estimated 42 (L) >60 mL/min    Comment: (NOTE) Calculated using the CKD-EPI Creatinine Equation (2021)    Anion gap 16 (H) 5 - 15    Comment: Performed at [facility], [address], [place], [place] [address]  CBC     Status: Abnormal   Collection Time: 01/18/23  8:47 AM  Result Value Ref Range   WBC 11.3 (H) 4.0 - 10.5 K/uL   RBC 4.51 3.87 - 5.11 MIL/uL   Hemoglobin 12.4 12.0 - 15.0 g/dL   HCT 08.6 57.8 - 46.9 %   MCV 83.1 80.0 - 100.0 fL   MCH 27.5 26.0 - 34.0 pg   MCHC 33.1 30.0 - 36.0 g/dL   RDW 62.9 (H) 52.8 - 41.3 %   Platelets 471 (H) 150 - 400 K/uL   nRBC 0.0 0.0 - 0.2 %    Comment: Performed at [facility]  Lab, [address], [place], [place] [address]  Lipase, blood     Status: None   Collection Time: 01/18/23  8:47 AM  Result Value Ref Range   Lipase 22 11 - 51 U/L    Comment: Performed at [facility], [address], [place], [place] [address]  Urinalysis, Routine w reflex microscopic -Urine, Clean Catch     Status: Abnormal   Collection Time: 01/18/23 10:45 AM  Result Value Ref Range   Color, Urine STRAW (A) YELLOW   APPearance CLEAR CLEAR   Specific Gravity, Urine 1.042 (H) 1.005 - 1.030   pH 5.0 5.0 - 8.0   Glucose, UA NEGATIVE NEGATIVE mg/dL   Hgb urine dipstick NEGATIVE NEGATIVE   Bilirubin Urine NEGATIVE NEGATIVE   Ketones, ur 20 (A) NEGATIVE mg/dL   Protein, ur NEGATIVE NEGATIVE mg/dL   Nitrite NEGATIVE NEGATIVE   Leukocytes,Ua NEGATIVE NEGATIVE    Comment: Performed at [facility], [address], [place], [place] [address]   CT ABDOMEN PELVIS W CONTRAST  Result Date: 01/18/2023 CLINICAL DATA:  Left upper quadrant/epigastric pain EXAM: CT ABDOMEN AND PELVIS WITH CONTRAST TECHNIQUE: Multidetector CT imaging of the abdomen and pelvis was performed using the standard protocol following bolus administration of intravenous contrast. RADIATION DOSE REDUCTION: This exam was performed according to the departmental dose-optimization program which includes automated exposure control, adjustment of the mA and/or kV according to patient size and/or use of iterative reconstruction technique. CONTRAST:  75mL OMNIPAQUE IOHEXOL 350 MG/ML SOLN COMPARISON:  CT abdomen/pelvis 10/21/2022 FINDINGS: Lower chest: The lung bases are clear. The imaged heart is unremarkable. Hepatobiliary: The liver is unremarkable. The gallbladder is surgically absent, likely accounting for the prominent intra and extrahepatic bile ducts. Pancreas: Unremarkable. Spleen: Unremarkable. Adrenals/Urinary Tract: A left adrenal nodule is unchanged requiring no specific imaging follow-up. The right adrenal is  unremarkable. Bilateral renal cysts are again noted requiring no specific imaging follow-up. The kidneys enhance symmetrically. There are no suspicious lesions. There are no stones in either kidney or along the course of either ureter. There is symmetric excretion of contrast into the collecting systems on the delayed images. The bladder is unremarkable. Stomach/Bowel: The stomach is unremarkable. There is no evidence of bowel obstruction. There is no abnormal bowel wall thickening or inflammatory change. The appendix is normal. There is colonic diverticulosis without evidence of acute diverticulitis. Vascular/Lymphatic: There is mild calcified plaque in the nonaneurysmal abdominal aorta. The major branch  vessels are patent. The main portal and splenic veins are patent. There is no abdominal or pelvic lymphadenopathy. Reproductive: The uterus is surgically absent. There is no adnexal mass. Other: There is no ascites or free air. Musculoskeletal: There is no acute osseous abnormality or suspicious osseous lesion. IMPRESSION: 1. No acute finding in the abdomen or pelvis. 2. Colonic diverticulosis without evidence of acute diverticulitis. Electronically Signed   By: Lesia Hausen M.D.   On: 01/18/2023 10:24    Pending Labs Unresulted Labs (From admission, onward)     Start     Ordered   01/18/23 1430  Rapid urine drug screen (hospital performed)  ONCE - STAT,   STAT        01/18/23 1429            Vitals/Pain Today's Vitals   01/18/23 1155 01/18/23 1240 01/18/23 1245 01/18/23 1300  BP:  119/66 125/66 122/71  Pulse: 85 85 86 90  Resp: (!) 25 17 19  (!) 24  Temp: 99.1 F (37.3 C)     TempSrc:      SpO2: 97% 97% 97% 96%    Isolation Precautions No active isolations  Medications Medications  droperidol (INAPSINE) 2.5 MG/ML injection 1.25 mg (has no administration in time range)  morphine (PF) 4 MG/ML injection 4 mg (4 mg Intravenous Given 01/18/23 0846)  ondansetron (ZOFRAN) injection 4 mg (4  mg Intravenous Given 01/18/23 0845)  sodium chloride 0.9 % bolus 500 mL (0 mLs Intravenous Stopped 01/18/23 1103)  iohexol (OMNIPAQUE) 350 MG/ML injection 75 mL (75 mLs Intravenous Contrast Given 01/18/23 1012)  ondansetron (ZOFRAN) injection 4 mg (4 mg Intravenous Given 01/18/23 1143)  dicyclomine (BENTYL) injection 20 mg (20 mg Intramuscular Given 01/18/23 1142)  pantoprazole (PROTONIX) injection 40 mg (40 mg Intravenous Given 01/18/23 1228)    Mobility walks     Focused Assessments    R Recommendations: See Admitting Provider Note  Report given to:   Additional Notes:

## 2023-01-18 NOTE — ED Triage Notes (Signed)
Pt BIB [facility] from home d/t abd pain, nausea and vomiting. Pt was seen at [facility] for same 2 days ago, reported no improvements. Denies cp or SOB. A&O X4.   Hr170/110 Cbg 90  HR 100

## 2023-01-18 NOTE — Assessment & Plan Note (Addendum)
Dating back to 2018, followed by hematology Thought 2/2 smoking, but work up done in 09/2022-BCR/ABL negative  Hx of IDA and thought thrombocytosis secondary to this, on oral iron and has f/u with hematology for more testing  Stable and she state she has a f/u with them soon

## 2023-01-18 NOTE — Assessment & Plan Note (Addendum)
57 year old presenting with 4 day  history of intractable nausea/vomiting in setting of intermittent nausea/vomiting since July of 2024.  -obs to med-surg -hx of gastric ulcers  -followed by GI and EGD in 09/2022 non bleeding gastric ulcer in antrum, gastritis, mucosal polyp. H.pylori tested: negative  -will increase protonix to BID  -CT abdomen/pelvis negative for acute findings  -note from GI in October said consider gastric emptying study if symptoms persist-team can consider ordering vs. Outpatient tomorrow  -gentle IVF -zofran PRN  -trial reglan BID  -UDS pending  -check celiac panel/TSH

## 2023-01-18 NOTE — ED Provider Notes (Signed)
[place] EMERGENCY DEPARTMENT AT [facility] Provider Note   CSN: [id number] Arrival date & time: 01/18/23  0744     History  Chief Complaint  Patient presents with   Abdominal Pain    Natalie Edwards is a 57 y.o. female with a history of gastric ulcers, GERD, and CKD who presents to the ED today for abdominal pain.  I saw patient several days ago for the same complaints at [facility] ED.  She was prescribed Bentyl and Zofran at the time but states she never received Bentyl.  Patient reports worsening pain to the left upper quadrant with associated nausea and vomiting.  The pain does not radiate to the back.  Patient reports 1 episode of vomiting today.  Denies fevers, dysuria, or diarrhea.  Denies marijuana or alcohol use. She has taken Zofran and Tylenol at home without relief of symptoms.  She has a history of cholecystitis and abdominal hysterectomy in the past.  No additional complaints or concerns at this time.    Home Medications Prior to Admission medications   Medication Sig Start Date End Date Taking? Authorizing Provider  amLODipine (NORVASC) 5 MG tablet Take 1 tablet (5 mg total) by mouth daily. 12/28/22   Sarina Ill, DO  dicyclomine (BENTYL) 20 MG tablet Take 1 tablet (20 mg total) by mouth 2 (two) times daily for 14 days. 01/16/23 01/30/23  Maxwell Marion, PA-C  diphenhydramine-acetaminophen (TYLENOL PM) 25-500 MG TABS tablet Take 1 tablet by mouth at bedtime as needed (pain).    [provider]  FLUoxetine (PROZAC) 20 MG capsule Take 1 capsule (20 mg total) by mouth daily. 12/28/22   Sarina Ill, DO  hydrOXYzine (ATARAX) 50 MG tablet Take 50 mg by mouth 3 (three) times daily as needed.    [provider]  loratadine (CLARITIN) 10 MG tablet Take 1 tablet (10 mg total) by mouth daily as needed for allergies. 09/24/22   Bing Neighbors, NP  mirtazapine (REMERON) 15 MG tablet Take 1 tablet (15 mg total) by mouth at bedtime.  12/27/22   Sarina Ill, DO  ondansetron (ZOFRAN) 4 MG tablet Take 1 tablet (4 mg total) by mouth every 8 (eight) hours as needed for nausea. 12/16/22   Meredith Pel, NP  ondansetron (ZOFRAN-ODT) 4 MG disintegrating tablet Take 1 tablet (4 mg total) by mouth every 8 (eight) hours as needed for nausea or vomiting. 01/16/23 02/15/23  Maxwell Marion, PA-C  pantoprazole (PROTONIX) 40 MG tablet Take 1 tablet (40 mg total) by mouth daily. 12/28/22   Sarina Ill, DO  polyethylene glycol (MIRALAX / GLYCOLAX) 17 g packet Take 17 g by mouth daily as needed for mild constipation. 09/30/22   Marguerita Merles Latif, DO  pregabalin (LYRICA) 75 MG capsule Take 1 capsule (75 mg total) by mouth 2 (two) times daily. 12/27/22   Sarina Ill, DO  risperiDONE (RISPERDAL) 0.5 MG tablet Take 1 tablet (0.5 mg total) by mouth 2 (two) times daily at 8 am and 4 pm. 12/27/22   Sarina Ill, DO  temazepam (RESTORIL) 30 MG capsule Take 1 capsule (30 mg total) by mouth at bedtime as needed. 11/04/22   Tomma Lightning, MD  traZODone (DESYREL) 100 MG tablet Take 1 tablet (100 mg total) by mouth at bedtime as needed for sleep. 12/27/22   Sarina Ill, DO      Allergies    Hydroxyzine and Ambien [zolpidem tartrate]    Review of Systems  Review of Systems  Gastrointestinal:  Positive for abdominal pain.  All other systems reviewed and are negative.   Physical Exam Updated Vital Signs BP 122/71   Pulse 90   Temp 99.1 F (37.3 C)   Resp (!) 24   SpO2 96%  Physical Exam Vitals and nursing note reviewed.  Constitutional:      Appearance: Normal appearance.  HENT:     Head: Normocephalic and atraumatic.     Mouth/Throat:     Mouth: Mucous membranes are moist.  Eyes:     Conjunctiva/sclera: Conjunctivae normal.     Pupils: Pupils are equal, round, and reactive to light.  Cardiovascular:     Rate and Rhythm: Normal rate and regular rhythm.     Pulses: Normal  pulses.     Heart sounds: Normal heart sounds.  Pulmonary:     Effort: Pulmonary effort is normal.     Breath sounds: Normal breath sounds.  Abdominal:     Palpations: Abdomen is soft.     Tenderness: There is abdominal tenderness. There is no guarding or rebound.     Comments: Left upper quadrant tenderness without rebound or guarding.  No abdominal distention.  Skin:    General: Skin is warm and dry.     Findings: No rash.  Neurological:     General: No focal deficit present.     Mental Status: She is alert.     Motor: No weakness.  Psychiatric:        Mood and Affect: Mood normal.        Behavior: Behavior normal.     ED Results / Procedures / Treatments   Labs (all labs ordered are listed, but only abnormal results are displayed) Labs Reviewed  COMPREHENSIVE METABOLIC PANEL - Abnormal; Notable for the following components:      Result Value   CO2 17 (*)    Creatinine, Ser 1.44 (*)    GFR, Estimated 42 (*)    Anion gap 16 (*)    All other components within normal limits  CBC - Abnormal; Notable for the following components:   WBC 11.3 (*)    RDW 18.0 (*)    Platelets 471 (*)    All other components within normal limits  URINALYSIS, ROUTINE W REFLEX MICROSCOPIC - Abnormal; Notable for the following components:   Color, Urine STRAW (*)    Specific Gravity, Urine 1.042 (*)    Ketones, ur 20 (*)    All other components within normal limits  LIPASE, BLOOD  RAPID URINE DRUG SCREEN, HOSP PERFORMED    EKG None  Radiology CT ABDOMEN PELVIS W CONTRAST  Result Date: 01/18/2023 CLINICAL DATA:  Left upper quadrant/epigastric pain EXAM: CT ABDOMEN AND PELVIS WITH CONTRAST TECHNIQUE: Multidetector CT imaging of the abdomen and pelvis was performed using the standard protocol following bolus administration of intravenous contrast. RADIATION DOSE REDUCTION: This exam was performed according to the departmental dose-optimization program which includes automated exposure control,  adjustment of the mA and/or kV according to patient size and/or use of iterative reconstruction technique. CONTRAST:  75mL OMNIPAQUE IOHEXOL 350 MG/ML SOLN COMPARISON:  CT abdomen/pelvis 10/21/2022 FINDINGS: Lower chest: The lung bases are clear. The imaged heart is unremarkable. Hepatobiliary: The liver is unremarkable. The gallbladder is surgically absent, likely accounting for the prominent intra and extrahepatic bile ducts. Pancreas: Unremarkable. Spleen: Unremarkable. Adrenals/Urinary Tract: A left adrenal nodule is unchanged requiring no specific imaging follow-up. The right adrenal is unremarkable. Bilateral renal cysts are again noted  requiring no specific imaging follow-up. The kidneys enhance symmetrically. There are no suspicious lesions. There are no stones in either kidney or along the course of either ureter. There is symmetric excretion of contrast into the collecting systems on the delayed images. The bladder is unremarkable. Stomach/Bowel: The stomach is unremarkable. There is no evidence of bowel obstruction. There is no abnormal bowel wall thickening or inflammatory change. The appendix is normal. There is colonic diverticulosis without evidence of acute diverticulitis. Vascular/Lymphatic: There is mild calcified plaque in the nonaneurysmal abdominal aorta. The major branch vessels are patent. The main portal and splenic veins are patent. There is no abdominal or pelvic lymphadenopathy. Reproductive: The uterus is surgically absent. There is no adnexal mass. Other: There is no ascites or free air. Musculoskeletal: There is no acute osseous abnormality or suspicious osseous lesion. IMPRESSION: 1. No acute finding in the abdomen or pelvis. 2. Colonic diverticulosis without evidence of acute diverticulitis. Electronically Signed   By: Lesia Hausen M.D.   On: 01/18/2023 10:24    Procedures Procedures: not indicated.   Medications Ordered in ED Medications  droperidol (INAPSINE) 2.5 MG/ML  injection 1.25 mg (has no administration in time range)  morphine (PF) 4 MG/ML injection 4 mg (4 mg Intravenous Given 01/18/23 0846)  ondansetron (ZOFRAN) injection 4 mg (4 mg Intravenous Given 01/18/23 0845)  sodium chloride 0.9 % bolus 500 mL (0 mLs Intravenous Stopped 01/18/23 1103)  iohexol (OMNIPAQUE) 350 MG/ML injection 75 mL (75 mLs Intravenous Contrast Given 01/18/23 1012)  ondansetron (ZOFRAN) injection 4 mg (4 mg Intravenous Given 01/18/23 1143)  dicyclomine (BENTYL) injection 20 mg (20 mg Intramuscular Given 01/18/23 1142)  pantoprazole (PROTONIX) injection 40 mg (40 mg Intravenous Given 01/18/23 1228)    ED Course/ Medical Decision Making/ A&P                                 Medical Decision Making Amount and/or Complexity of Data Reviewed Labs: ordered. Radiology: ordered.  Risk Prescription drug management.   This patient presents to the ED for concern of abdominal pain, this involves an extensive number of treatment options, and is a complaint that carries with it a high risk of complications and morbidity.   Differential diagnosis includes: gastritis, PUD, gastroenteritis, diverticulitis, bowel obstruction, perforation, peritonitis, etc.   Comorbidities  See HPI above   Additional History  Additional history obtained from prior ED notes and GI notes.   Cardiac Monitoring / EKG  The patient was maintained on a cardiac monitor.  I personally viewed and interpreted the cardiac monitored which showed: Sinus tachycardia with a heart rate of 113 bpm.   Lab Tests  I ordered and personally interpreted labs.  The pertinent results include:   CMP, lipase, CBC within normal limits no acute electrolyte derangement, AKI, transaminitis UA shows ketones but no signs of infection.    Imaging Studies  I ordered imaging studies including CT abdomen/pelvis with contrast I independently visualized and interpreted imaging which showed: No acute intra-abdominal processes I  agree with the radiologist interpretation   Consultations  I requested consultation with the hospitalist Dr. Artis Flock,  and discussed lab and imaging findings as well as pertinent plan - they recommend: Patient for further evaluation of symptoms.   Problem List / ED Course / Critical Interventions / Medication Management  Left upper quadrant pain with nausea and vomiting I ordered medications including: Morphine and Zofran for pain Reevaluation of the  patient after these medicines showed that the patient stayed the same. I then ordered Bentyl and Zofran which did not help as well.  I then ordered droperidol for pain. I have reviewed the patients home medicines and have made adjustments as needed   Social Determinants of Health  Access to healthcare.   Test / Admission - Considered  Discussed findings with patient.  She is agreeable for admission.       Final Clinical Impression(s) / ED Diagnoses Final diagnoses:  Intractable nausea and vomiting  Left upper quadrant abdominal pain    Rx / DC Orders ED Discharge Orders     None         Maxwell Marion, PA-C 01/18/23 1432    Lonell Grandchild, MD 01/19/23 [phone]

## 2023-01-18 NOTE — Plan of Care (Signed)

## 2023-01-18 NOTE — Assessment & Plan Note (Signed)
Hx of gastric ulcers.  EGD in 09/2022: non bleeding gastric ulcer in antrum, gastritis, mucosal polyp. H.pylori tested: negative  D/c on protonix BID, now on daily Will increase back to protonix BID

## 2023-01-18 NOTE — Assessment & Plan Note (Signed)
Well controlled, continue norvasc 5mg daily  ?

## 2023-01-18 NOTE — H&P (Signed)
History and Physical    Patient: Natalie Edwards ZOX:[id number] DOB: Aug 14, 1965 DOA: 01/18/2023 DOS: the patient was seen and examined on 01/18/2023 PCP: [name], MD  Patient coming from: Home - lives alone. Ambulates independently    Chief Complaint: intractable N/V   HPI: Natalie Edwards is a 57 y.o. female with medical history significant of HTN, GERD, anxiety and depression, hx of gastric ulcers, insomnia, leukocytosis who presented for intractrable N/V.  She states the nausea started back in July. She states she was hospitalized in July and had a EGD at that time which showed gastric ulcers. She was placed on protonix BID. 2-3 days later she was back in here for diverticulitis. She was seen by GI. She states then in September she was up in [place] and had abdominal pain and nausea/vomiting and went to the Ed. She was not admitted. She has been fine until now. She states her nausea/vomiting started on Thursday. She states she doesn't like throwing up and mainly has nausea. She has only had one episode of vomiting since that time and that was this morning. She just remains nauseated with dizzy spells and a headache. She is not eating due to the nausea. She denies any sick contacts. Denies any diarrhea. No fever/chills.   Denies any fever/chills, headaches, chest pain or palpitations, shortness of breath or cough, dysuria or leg swelling.   She smokes 1PP 3 days no alcohol.   ER Course:  vitals: afebrile, bp: 118/62, HR: 90. RR: 33, oxygen: 97%RA Pertinent labs: wbc: 11.3, creatinine 1.44, UA: 20 ketones  CT abdomen/pelvis: no acute finding. Diverticulosis without diverticulitis  In ED: given 500cc IVF bolus, zofran protonix and bentyl. [facility] asked to admit.    Review of Systems: As mentioned in the history of present illness. All other systems reviewed and are negative. Past Medical History:  Diagnosis Date   Allergy    Anemia    Diverticulosis    Gall bladder stones 1993   Gastric ulcer     GERD (gastroesophageal reflux disease)    Hypertension    Renal mass    Past Surgical History:  Procedure Laterality Date   ABDOMINAL HYSTERECTOMY     BIOPSY  09/29/2022   Procedure: BIOPSY;  Surgeon: Napoleon Form, MD;  Location: [facility];  Service: Gastroenterology;;   CHOLECYSTECTOMY     ESOPHAGOGASTRODUODENOSCOPY (EGD) WITH PROPOFOL N/A 09/29/2022   Procedure: ESOPHAGOGASTRODUODENOSCOPY (EGD) WITH PROPOFOL;  Surgeon: Napoleon Form, MD;  Location: [facility];  Service: Gastroenterology;  Laterality: N/A;   Social History:  reports that she has been smoking cigarettes. She has a 12.5 pack-year smoking history. She has never used smokeless tobacco. She reports current alcohol use. She reports that she does not use drugs.  Allergies  Allergen Reactions   Hydroxyzine Other (See Comments)    Palpitations and jitteriness    Ambien [Zolpidem Tartrate] Other (See Comments)    Jitteriness, nervousness, abdominal pain    Family History  Problem Relation Age of Onset   Cancer Maternal Uncle        Lung   Cancer Maternal Uncle        Lung   Cancer Maternal Uncle        Lung   Headache Neg Hx        "I don't think so"   Migraines Neg Hx        "I don't think so"    Prior to Admission medications   Medication Sig Start Date End Date  Taking? Authorizing Provider  amLODipine (NORVASC) 5 MG tablet Take 1 tablet (5 mg total) by mouth daily. 12/28/22   Sarina Ill, DO  dicyclomine (BENTYL) 20 MG tablet Take 1 tablet (20 mg total) by mouth 2 (two) times daily for 14 days. 01/16/23 01/30/23  Maxwell Marion, PA-C  diphenhydramine-acetaminophen (TYLENOL PM) 25-500 MG TABS tablet Take 1 tablet by mouth at bedtime as needed (pain).    [provider]  FLUoxetine (PROZAC) 20 MG capsule Take 1 capsule (20 mg total) by mouth daily. 12/28/22   Sarina Ill, DO  hydrOXYzine (ATARAX) 50 MG tablet Take 50 mg by mouth 3 (three) times daily as needed.     [provider]  loratadine (CLARITIN) 10 MG tablet Take 1 tablet (10 mg total) by mouth daily as needed for allergies. 09/24/22   Bing Neighbors, NP  mirtazapine (REMERON) 15 MG tablet Take 1 tablet (15 mg total) by mouth at bedtime. 12/27/22   Sarina Ill, DO  ondansetron (ZOFRAN) 4 MG tablet Take 1 tablet (4 mg total) by mouth every 8 (eight) hours as needed for nausea. 12/16/22   Meredith Pel, NP  ondansetron (ZOFRAN-ODT) 4 MG disintegrating tablet Take 1 tablet (4 mg total) by mouth every 8 (eight) hours as needed for nausea or vomiting. 01/16/23 02/15/23  Maxwell Marion, PA-C  pantoprazole (PROTONIX) 40 MG tablet Take 1 tablet (40 mg total) by mouth daily. 12/28/22   Sarina Ill, DO  polyethylene glycol (MIRALAX / GLYCOLAX) 17 g packet Take 17 g by mouth daily as needed for mild constipation. 09/30/22   Marguerita Merles Latif, DO  pregabalin (LYRICA) 75 MG capsule Take 1 capsule (75 mg total) by mouth 2 (two) times daily. 12/27/22   Sarina Ill, DO  risperiDONE (RISPERDAL) 0.5 MG tablet Take 1 tablet (0.5 mg total) by mouth 2 (two) times daily at 8 am and 4 pm. 12/27/22   Sarina Ill, DO  temazepam (RESTORIL) 30 MG capsule Take 1 capsule (30 mg total) by mouth at bedtime as needed. 11/04/22   Tomma Lightning, MD  traZODone (DESYREL) 100 MG tablet Take 1 tablet (100 mg total) by mouth at bedtime as needed for sleep. 12/27/22   Sarina Ill, DO    Physical Exam: Vitals:   01/18/23 1240 01/18/23 1245 01/18/23 1300 01/18/23 1534  BP: 119/66 125/66 122/71 (!) 149/81  Pulse: 85 86 90 98  Resp: 17 19 (!) 24 (!) 22  Temp:    98.2 F (36.8 C)  TempSrc:    Oral  SpO2: 97% 97% 96% 99%  Weight:    77.1 kg  Height:    5\' 4"  (1.626 m)   General:  Appears calm and comfortable and is in NAD Eyes:  PERRL, EOMI, normal lids, iris ENT:  grossly normal hearing, lips & tongue, mmm; appropriate dentition Neck:  no LAD, masses or  thyromegaly; no carotid bruits Cardiovascular:  RRR, no m/r/g. No LE edema.  Respiratory:   CTA bilaterally with no wheezes/rales/rhonchi.  Normal respiratory effort. Abdomen:  soft,TTP epigastric and LUQ. No rebound or guarding. Quiet BS  Back:   normal alignment, no CVAT Skin:  no rash or induration seen on limited exam Musculoskeletal:  grossly normal tone BUE/BLE, good ROM, no bony abnormality Lower extremity:  No LE edema.  Limited foot exam with no ulcerations.  2+ distal pulses. Psychiatric:  grossly normal mood and affect, speech fluent and appropriate, AOx3 Neurologic:  CN 2-12 grossly intact,  moves all extremities in coordinated fashion, sensation intact   Radiological Exams on Admission: Independently reviewed - see discussion in A/P where applicable  CT ABDOMEN PELVIS W CONTRAST  Result Date: 01/18/2023 CLINICAL DATA:  Left upper quadrant/epigastric pain EXAM: CT ABDOMEN AND PELVIS WITH CONTRAST TECHNIQUE: Multidetector CT imaging of the abdomen and pelvis was performed using the standard protocol following bolus administration of intravenous contrast. RADIATION DOSE REDUCTION: This exam was performed according to the departmental dose-optimization program which includes automated exposure control, adjustment of the mA and/or kV according to patient size and/or use of iterative reconstruction technique. CONTRAST:  75mL OMNIPAQUE IOHEXOL 350 MG/ML SOLN COMPARISON:  CT abdomen/pelvis 10/21/2022 FINDINGS: Lower chest: The lung bases are clear. The imaged heart is unremarkable. Hepatobiliary: The liver is unremarkable. The gallbladder is surgically absent, likely accounting for the prominent intra and extrahepatic bile ducts. Pancreas: Unremarkable. Spleen: Unremarkable. Adrenals/Urinary Tract: A left adrenal nodule is unchanged requiring no specific imaging follow-up. The right adrenal is unremarkable. Bilateral renal cysts are again noted requiring no specific imaging follow-up. The  kidneys enhance symmetrically. There are no suspicious lesions. There are no stones in either kidney or along the course of either ureter. There is symmetric excretion of contrast into the collecting systems on the delayed images. The bladder is unremarkable. Stomach/Bowel: The stomach is unremarkable. There is no evidence of bowel obstruction. There is no abnormal bowel wall thickening or inflammatory change. The appendix is normal. There is colonic diverticulosis without evidence of acute diverticulitis. Vascular/Lymphatic: There is mild calcified plaque in the nonaneurysmal abdominal aorta. The major branch vessels are patent. The main portal and splenic veins are patent. There is no abdominal or pelvic lymphadenopathy. Reproductive: The uterus is surgically absent. There is no adnexal mass. Other: There is no ascites or free air. Musculoskeletal: There is no acute osseous abnormality or suspicious osseous lesion. IMPRESSION: 1. No acute finding in the abdomen or pelvis. 2. Colonic diverticulosis without evidence of acute diverticulitis. Electronically Signed   By: Lesia Hausen M.D.   On: 01/18/2023 10:24    EKG: Independently reviewed.  Sinus tachy with rate 113; nonspecific ST changes with no evidence of acute ischemia   Labs on Admission: I have personally reviewed the available labs and imaging studies at the time of the admission.  Pertinent labs:   wbc: 11.3,  creatinine 1.44,  UA: 20 ketones   Assessment and Plan: Principal Problem:   Intractable nausea and vomiting Active Problems:   AKI (acute kidney injury) (HCC)   Leukocytosis/thrombocytosis   GERD (gastroesophageal reflux disease)   Essential hypertension   Anxiety and depression   Chronic insomnia    Assessment and Plan: * Intractable nausea and vomiting 57 year old presenting with 4 day  history of intractable nausea/vomiting in setting of intermittent nausea/vomiting since July of 2024.  -obs to med-surg -hx of gastric  ulcers  -followed by GI and EGD in 09/2022 non bleeding gastric ulcer in antrum, gastritis, mucosal polyp. H.pylori tested: negative  -will increase protonix to BID  -CT abdomen/pelvis negative for acute findings  -note from GI in October said consider gastric emptying study if symptoms persist-team can consider ordering vs. Outpatient tomorrow  -gentle IVF -zofran PRN  -trial reglan BID  -UDS pending  -check celiac panel/TSH    AKI (acute kidney injury) (HCC) Creatinine of 1.44 in setting of poor PO intake x 4 days and intractable nausea/vomiting  Received small IVF bolus in continue time limited IVF since can not tolerate oral  intake Avoid nephrotoxic drugs Trend   Leukocytosis/thrombocytosis Dating back to 2018, followed by hematology Thought 2/2 smoking, but work up done in 09/2022-BCR/ABL negative  Hx of IDA and thought thrombocytosis secondary to this, on oral iron and has f/u with hematology for more testing  Stable and she state she has a f/u with them soon   GERD (gastroesophageal reflux disease) Hx of gastric ulcers.  EGD in 09/2022: non bleeding gastric ulcer in antrum, gastritis, mucosal polyp. H.pylori tested: negative  D/c on protonix BID, now on daily Will increase back to protonix BID   Essential hypertension Well controlled, continue norvasc 5mg  daily   Anxiety and depression Continue prozac, remeron and hydroxyzine prn   Chronic insomnia Continue restoril PRN     Advance Care Planning:   Code Status: Limited: Do not attempt resuscitation (DNR) -DNR-LIMITED -Do Not Intubate/DNI    Consults: none   DVT Prophylaxis: lovenox   Family Communication: none   Severity of Illness: The appropriate patient status for this patient is OBSERVATION. Observation status is judged to be reasonable and necessary in order to provide the required intensity of service to ensure the patient's safety. The patient's presenting symptoms, physical exam findings, and initial  radiographic and laboratory data in the context of their medical condition is felt to place them at decreased risk for further clinical deterioration. Furthermore, it is anticipated that the patient will be medically stable for discharge from the hospital within 2 midnights of admission.   Author: Orland Mustard, MD 01/18/2023 5:04 PM  For on call review [email or web address].

## 2023-01-19 DIAGNOSIS — F419 Anxiety disorder, unspecified: Secondary | ICD-10-CM | POA: Diagnosis present

## 2023-01-19 DIAGNOSIS — K3184 Gastroparesis: Secondary | ICD-10-CM | POA: Diagnosis present

## 2023-01-19 DIAGNOSIS — F17210 Nicotine dependence, cigarettes, uncomplicated: Secondary | ICD-10-CM | POA: Diagnosis present

## 2023-01-19 DIAGNOSIS — Z9049 Acquired absence of other specified parts of digestive tract: Secondary | ICD-10-CM | POA: Diagnosis not present

## 2023-01-19 DIAGNOSIS — R1013 Epigastric pain: Secondary | ICD-10-CM | POA: Diagnosis not present

## 2023-01-19 DIAGNOSIS — N179 Acute kidney failure, unspecified: Secondary | ICD-10-CM | POA: Diagnosis present

## 2023-01-19 DIAGNOSIS — G4700 Insomnia, unspecified: Secondary | ICD-10-CM | POA: Diagnosis present

## 2023-01-19 DIAGNOSIS — Z6829 Body mass index (BMI) 29.0-29.9, adult: Secondary | ICD-10-CM | POA: Diagnosis not present

## 2023-01-19 DIAGNOSIS — K5900 Constipation, unspecified: Secondary | ICD-10-CM | POA: Diagnosis present

## 2023-01-19 DIAGNOSIS — E872 Acidosis, unspecified: Secondary | ICD-10-CM | POA: Diagnosis present

## 2023-01-19 DIAGNOSIS — G8929 Other chronic pain: Secondary | ICD-10-CM | POA: Diagnosis not present

## 2023-01-19 DIAGNOSIS — D75839 Thrombocytosis, unspecified: Secondary | ICD-10-CM | POA: Diagnosis present

## 2023-01-19 DIAGNOSIS — Z79899 Other long term (current) drug therapy: Secondary | ICD-10-CM | POA: Diagnosis not present

## 2023-01-19 DIAGNOSIS — Z888 Allergy status to other drugs, medicaments and biological substances status: Secondary | ICD-10-CM | POA: Diagnosis not present

## 2023-01-19 DIAGNOSIS — R112 Nausea with vomiting, unspecified: Secondary | ICD-10-CM | POA: Diagnosis present

## 2023-01-19 DIAGNOSIS — I10 Essential (primary) hypertension: Secondary | ICD-10-CM | POA: Diagnosis present

## 2023-01-19 DIAGNOSIS — R634 Abnormal weight loss: Secondary | ICD-10-CM | POA: Diagnosis present

## 2023-01-19 DIAGNOSIS — K219 Gastro-esophageal reflux disease without esophagitis: Secondary | ICD-10-CM | POA: Diagnosis present

## 2023-01-19 DIAGNOSIS — F32A Depression, unspecified: Secondary | ICD-10-CM | POA: Diagnosis present

## 2023-01-19 DIAGNOSIS — Z90710 Acquired absence of both cervix and uterus: Secondary | ICD-10-CM | POA: Diagnosis not present

## 2023-01-19 DIAGNOSIS — Z87442 Personal history of urinary calculi: Secondary | ICD-10-CM | POA: Diagnosis not present

## 2023-01-19 DIAGNOSIS — D72829 Elevated white blood cell count, unspecified: Secondary | ICD-10-CM | POA: Diagnosis present

## 2023-01-19 DIAGNOSIS — Z0389 Encounter for observation for other suspected diseases and conditions ruled out: Secondary | ICD-10-CM | POA: Diagnosis not present

## 2023-01-19 DIAGNOSIS — Z66 Do not resuscitate: Secondary | ICD-10-CM | POA: Diagnosis present

## 2023-01-19 LAB — BASIC METABOLIC PANEL
Anion gap: 16 — ABNORMAL HIGH (ref 5–15)
BUN: 13 mg/dL (ref 6–20)
CO2: 14 mmol/L — ABNORMAL LOW (ref 22–32)
Calcium: 9.3 mg/dL (ref 8.9–10.3)
Chloride: 107 mmol/L (ref 98–111)
Creatinine, Ser: 1.17 mg/dL — ABNORMAL HIGH (ref 0.44–1.00)
GFR, Estimated: 54 mL/min — ABNORMAL LOW (ref >60)
Glucose, Bld: 83 mg/dL (ref 70–99)
Potassium: 4.4 mmol/L (ref 3.5–5.1)
Sodium: 137 mmol/L (ref 135–145)

## 2023-01-19 LAB — CBC
HCT: 42.2 % (ref 36.0–46.0)
Hemoglobin: 13.3 g/dL (ref 12.0–15.0)
MCH: 26.9 pg (ref 26.0–34.0)
MCHC: 31.5 g/dL (ref 30.0–36.0)
MCV: 85.4 fL (ref 80.0–100.0)
Platelets: 427 10*3/uL — ABNORMAL HIGH (ref 150–400)
RBC: 4.94 MIL/uL (ref 3.87–5.11)
RDW: 18.6 % — ABNORMAL HIGH (ref 11.5–15.5)
WBC: 12.7 10*3/uL — ABNORMAL HIGH (ref 4.0–10.5)
nRBC: 0 % (ref 0.0–0.2)

## 2023-01-19 LAB — TISSUE TRANSGLUTAMINASE, IGA: Tissue Transglutaminase Ab, IgA: <2 U/mL (ref 0–3)

## 2023-01-19 LAB — GLIADIN ANTIBODIES, SERUM
Antigliadin Abs, IgA: 9 U (ref 0–19)
Gliadin IgG: 3 U (ref 0–19)

## 2023-01-19 LAB — PHOSPHORUS: Phosphorus: 3.9 mg/dL (ref 2.5–4.6)

## 2023-01-19 MED ORDER — BISACODYL 10 MG RE SUPP: 10.0000 mg | Freq: Every day | RECTAL | Status: DC | PRN

## 2023-01-19 MED ORDER — SUCRALFATE 1 G PO TABS
1.0000 g | ORAL_TABLET | Freq: Three times a day (TID) | ORAL | Status: DC
  Administered 2023-01-19 – 2023-01-22 (×9): 1 g via ORAL
  Filled 2023-01-19 (×9): qty 1

## 2023-01-19 MED ORDER — SODIUM CHLORIDE 0.45 % IV SOLN
INTRAVENOUS | Status: DC
  Filled 2023-01-19 (×3): qty 75

## 2023-01-19 MED ORDER — POLYETHYLENE GLYCOL 3350 17 G PO PACK
17.0000 g | PACK | Freq: Two times a day (BID) | ORAL | Status: DC
  Administered 2023-01-19 – 2023-01-22 (×6): 17 g via ORAL
  Filled 2023-01-19 (×6): qty 1

## 2023-01-19 MED ORDER — BISACODYL 5 MG PO TBEC
10.0000 mg | DELAYED_RELEASE_TABLET | Freq: Once | ORAL | Status: AC
  Administered 2023-01-19: 10 mg via ORAL
  Filled 2023-01-19: qty 2

## 2023-01-19 MED ORDER — ENSURE ENLIVE PO LIQD
237.0000 mL | Freq: Two times a day (BID) | ORAL | Status: DC
  Administered 2023-01-21: 237 mL via ORAL

## 2023-01-19 MED ORDER — BISACODYL 5 MG PO TBEC
10.0000 mg | DELAYED_RELEASE_TABLET | Freq: Every day | ORAL | Status: DC
  Administered 2023-01-20 – 2023-01-21 (×2): 10 mg via ORAL
  Filled 2023-01-19 (×3): qty 2

## 2023-01-19 NOTE — Progress Notes (Signed)
[facility] Progress Note  Patient: Natalie Edwards    FAO:[id number]  DOA: 01/18/2023     Date of Service: the patient was seen and examined on 01/19/2023  Chief Complaint  Patient presents with   Abdominal Pain   Brief hospital course: Sharea Guinther is a 57 y.o. female with medical history significant of HTN, GERD, anxiety and depression, hx of gastric ulcers, insomnia, leukocytosis who presented for intractrable N/V.  She states the nausea started back in July. She states she was hospitalized in July and had a EGD at that time which showed gastric ulcers. She was placed on protonix BID. 2-3 days later she was back in here for diverticulitis. She was seen by GI. She states then in September she was up in [place] and had abdominal pain and nausea/vomiting and went to the Ed. She was not admitted. She has been fine until now. She states her nausea/vomiting started on Thursday. She states she doesn't like throwing up and mainly has nausea. She has only had one episode of vomiting since that time and that was this morning. She just remains nauseated with dizzy spells and a headache. She is not eating due to the nausea. She denies any sick contacts. Denies any diarrhea. No fever/chills.    Denies any fever/chills, headaches, chest pain or palpitations, shortness of breath or cough, dysuria or leg swelling.    She smokes 1PP 3 days no alcohol.    ER Course:  vitals: afebrile, bp: 118/62, HR: 90. RR: 33, oxygen: 97%RA Pertinent labs: wbc: 11.3, creatinine 1.44, UA: 20 ketones  CT abdomen/pelvis: no acute finding. Diverticulosis without diverticulitis  In ED: given 500cc IVF bolus, zofran protonix and bentyl. [facility] asked to admit.    Assessment and Plan:  Intractable nausea and vomiting 57 year old presenting with 4 day  history of intractable nausea/vomiting in setting of intermittent nausea/vomiting since July of 2024.  -hx of gastric ulcers  -followed by GI and EGD in 09/2022 non bleeding gastric  ulcer in antrum, gastritis, mucosal polyp. H.pylori tested: negative  -will increase protonix to BID  -CT abdomen/pelvis negative for acute findings  -note from GI in October said consider gastric emptying study if symptoms persist-team can consider ordering vs. Outpatient tomorrow  -gentle IVF -zofran PRN  -trial reglan BID  -UDS positive for opiates and benzo TSH 1.2 WNL 11/4 started Carafate 1 g p.o. 3 times daily, continue pantoprazole 40 mg IV twice daily -check celiac panel, pending -Follow H. pylori stool antigen   # Metabolic acidosis due to intractable vomiting. Bicarb 14 Started bicarb IV infusion Monitor electrolytes daily    AKI (acute kidney injury) (HCC) Creatinine of 1.44 in setting of poor PO intake x 4 days and intractable nausea/vomiting  Received small IVF bolus in continue time limited IVF since can not tolerate oral intake Avoid nephrotoxic drugs Trend  Cr 1.17 improving  Leukocytosis/thrombocytosis Dating back to 2018, followed by hematology Thought 2/2 smoking, but work up done in 09/2022-BCR/ABL negative  Hx of IDA and thought thrombocytosis secondary to this, on oral iron and has f/u with hematology for more testing  Stable and she state she has a f/u with them soon    GERD (gastroesophageal reflux disease) Hx of gastric ulcers.  EGD in 09/2022: non bleeding gastric ulcer in antrum, gastritis, mucosal polyp. H.pylori tested: negative  D/c on protonix BID, now on daily Will increase back to protonix BID    Essential hypertension Well controlled, continue norvasc 5mg  daily  Anxiety and depression Continue prozac, remeron and hydroxyzine prn    Chronic insomnia Continue restoril PRN    Constipation, started MiraLAX and Dulcolax   Body mass index is 29.18 kg/m.  Interventions:  Diet: Regular diet  DVT Prophylaxis: Subcutaneous Lovenox   Advance goals of care discussion: Full code  Family Communication: family was present at bedside, at  the time of interview.  The pt provided permission to discuss medical plan with the family. Opportunity was given to ask question and all questions were answered satisfactorily.   Disposition:  Pt is from home, admitted with intractable nausea and vomiting, found to have metabolic acidosis, started bicarb IV infusion, , which precludes a safe discharge. Discharge to home, when stable, may need few days to improve..  Subjective: No significant events overnight, patient still has nausea, no vomiting.  Complaining of pain in the stomach 10/10, no any other complaints.  Physical Exam: General: NAD, lying comfortably Appear in no distress, affect appropriate Eyes: PERRLA ENT: Oral Mucosa Clear, moist  Neck: no JVD,  Cardiovascular: S1 and S2 Present, no Murmur,  Respiratory: good respiratory effort, Bilateral Air entry equal and Decreased, no Crackles, no wheezes Abdomen: Bowel Sound present, Soft and epigastric tenderness,  Skin: no rashes Extremities: no Pedal edema, no calf tenderness Neurologic: without any new focal findings Gait not checked due to patient safety concerns  Vitals:   01/18/23 1920 01/18/23 2354 01/19/23 0315 01/19/23 0748  BP: (!) 147/83 116/73 (!) 102/58 131/75  Pulse: 98 90 79 70  Resp: 16 16 18 18   Temp: 97.9 F (36.6 C) 98.9 F (37.2 C) 98.3 F (36.8 C) 98 F (36.7 C)  TempSrc: Oral   Oral  SpO2: 98% 98% 97% 99%  Weight:      Height:        Intake/Output Summary (Last 24 hours) at 01/19/2023 1125 Last data filed at 01/18/2023 2104 Gross per 24 hour  Intake 169.86 ml  Output --  Net 169.86 ml   Filed Weights   01/18/23 1534  Weight: 77.1 kg    Data Reviewed: I have personally reviewed and interpreted daily labs, tele strips, imagings as discussed above. I reviewed all nursing notes, pharmacy notes, vitals, pertinent old records I have discussed plan of care as described above with RN and patient/family.  CBC: Recent Labs  Lab 01/16/23 1058  01/18/23 0847 01/19/23 0647  WBC 11.5* 11.3* 12.7*  NEUTROABS 6.4  --   --   HGB 12.2 12.4 13.3  HCT 37.9 37.5 42.2  MCV 84.4 83.1 85.4  PLT 495* 471* 427*   Basic Metabolic Panel: Recent Labs  Lab 01/16/23 1058 01/18/23 0847 01/18/23 1609 01/18/23 1610 01/19/23 0647  NA 140 138  --   --  137  K 4.6 4.0  --   --  4.4  CL 106 105  --   --  107  CO2 24 17*  --   --  14*  GLUCOSE 88 74  --   --  83  BUN 11 13  --   --  13  CREATININE 0.92 1.44*  --   --  1.17*  CALCIUM 9.4 9.7  --   --  9.3  MG  --   --   --  1.7  --   PHOS  --   --  3.9  --   --     Studies: No results found.  Scheduled Meds:  amLODipine  5 mg Oral Daily   bisacodyl  10 mg Oral QHS   bisacodyl  10 mg Oral Once   dicyclomine  20 mg Oral BID   enoxaparin (LOVENOX) injection  40 mg Subcutaneous Q24H   feeding supplement  237 mL Oral BID BM   FLUoxetine  20 mg Oral Daily   metoCLOPramide (REGLAN) injection  5 mg Intravenous Q12H   mirtazapine  15 mg Oral QHS   nicotine  14 mg Transdermal Daily   pantoprazole (PROTONIX) IV  40 mg Intravenous Q12H   polyethylene glycol  17 g Oral BID   pregabalin  75 mg Oral BID   sucralfate  1 g Oral TID with meals   temazepam  30 mg Oral QHS   Continuous Infusions:  sodium bicarbonate 75 mEq in sodium chloride 0.45 % 1,075 mL infusion 100 mL/hr at 01/19/23 1054   PRN Meds: acetaminophen **OR** acetaminophen, bisacodyl, hydrOXYzine, morphine injection, ondansetron **OR** ondansetron (ZOFRAN) IV, traZODone  Time spent: 55 minutes  Author: Gillis Santa. MD [facility] 01/19/2023 11:25 AM  To reach On-call, see care teams to locate the attending and reach out to them via [email or web address]. If 7PM-7AM, please contact night-coverage If you still have difficulty reaching the attending provider, please page the [facility] (Director on Call) for [facility] on amion for assistance.

## 2023-01-19 NOTE — Plan of Care (Signed)

## 2023-01-20 ENCOUNTER — Telehealth: Payer: Self-pay | Admitting: *Deleted

## 2023-01-20 DIAGNOSIS — R112 Nausea with vomiting, unspecified: Secondary | ICD-10-CM | POA: Diagnosis not present

## 2023-01-20 LAB — BASIC METABOLIC PANEL
Anion gap: 9 (ref 5–15)
BUN: 15 mg/dL (ref 6–20)
CO2: 24 mmol/L (ref 22–32)
Calcium: 8.7 mg/dL — ABNORMAL LOW (ref 8.9–10.3)
Chloride: 106 mmol/L (ref 98–111)
Creatinine, Ser: 1.08 mg/dL — ABNORMAL HIGH (ref 0.44–1.00)
GFR, Estimated: 60 mL/min — ABNORMAL LOW (ref >60)
Glucose, Bld: 93 mg/dL (ref 70–99)
Potassium: 3.6 mmol/L (ref 3.5–5.1)
Sodium: 139 mmol/L (ref 135–145)

## 2023-01-20 LAB — CBC
HCT: 34.1 % — ABNORMAL LOW (ref 36.0–46.0)
Hemoglobin: 11.3 g/dL — ABNORMAL LOW (ref 12.0–15.0)
MCH: 27.4 pg (ref 26.0–34.0)
MCHC: 33.1 g/dL (ref 30.0–36.0)
MCV: 82.8 fL (ref 80.0–100.0)
Platelets: 421 10*3/uL — ABNORMAL HIGH (ref 150–400)
RBC: 4.12 MIL/uL (ref 3.87–5.11)
RDW: 18 % — ABNORMAL HIGH (ref 11.5–15.5)
WBC: 11.7 10*3/uL — ABNORMAL HIGH (ref 4.0–10.5)
nRBC: 0 % (ref 0.0–0.2)

## 2023-01-20 LAB — MAGNESIUM: Magnesium: 1.6 mg/dL — ABNORMAL LOW (ref 1.7–2.4)

## 2023-01-20 LAB — PHOSPHORUS: Phosphorus: 2.8 mg/dL (ref 2.5–4.6)

## 2023-01-20 MED ORDER — MAGNESIUM SULFATE 2 GM/50ML IV SOLN
2.0000 g | Freq: Once | INTRAVENOUS | Status: AC
  Administered 2023-01-20: 2 g via INTRAVENOUS
  Filled 2023-01-20: qty 50

## 2023-01-20 MED ORDER — BISACODYL 10 MG RE SUPP
10.0000 mg | Freq: Once | RECTAL | Status: AC
  Administered 2023-01-20: 10 mg via RECTAL
  Filled 2023-01-20: qty 1

## 2023-01-20 MED ORDER — SORBITOL 70 % SOLN
960.0000 mL | TOPICAL_OIL | Freq: Once | ORAL | Status: DC
  Filled 2023-01-20: qty 240

## 2023-01-20 NOTE — Telephone Encounter (Signed)
Patient called from the hospital wanting to know if she can have her gastric emptying study done while she is admitted into [facility]. Nurse, Ms. Donato Heinz was in the room with the patient during the conversation and I asked if she could see if that was possible. Asked if she could speak with the hospitalist or call imaging to see if it is possible, because I don't know if it is possible. Ms. Donato Heinz states that she will try to see if it is possible to have the study done while the patient is in the hospital. Imaging has been notified as to why the patient has not been scheduled for the gastric emptying study.

## 2023-01-20 NOTE — Progress Notes (Signed)
[facility] Progress Note  Patient: Natalie Edwards    KGM:[id number]  DOA: 01/18/2023     Date of Service: the patient was seen and examined on 01/20/2023  Chief Complaint  Patient presents with   Abdominal Pain   Brief hospital course: Natalie Edwards is a 57 y.o. female with medical history significant of HTN, GERD, anxiety and depression, hx of gastric ulcers, insomnia, leukocytosis who presented for intractrable N/V.  She states the nausea started back in July. She states she was hospitalized in July and had a EGD at that time which showed gastric ulcers. She was placed on protonix BID. 2-3 days later she was back in here for diverticulitis. She was seen by GI. She states then in September she was up in [place] and had abdominal pain and nausea/vomiting and went to the Ed. She was not admitted. She has been fine until now. She states her nausea/vomiting started on Thursday. She states she doesn't like throwing up and mainly has nausea. She has only had one episode of vomiting since that time and that was this morning. She just remains nauseated with dizzy spells and a headache. She is not eating due to the nausea. She denies any sick contacts. Denies any diarrhea. No fever/chills.    Denies any fever/chills, headaches, chest pain or palpitations, shortness of breath or cough, dysuria or leg swelling.    She smokes 1PP 3 days no alcohol.    ER Course:  vitals: afebrile, bp: 118/62, HR: 90. RR: 33, oxygen: 97%RA Pertinent labs: wbc: 11.3, creatinine 1.44, UA: 20 ketones  CT abdomen/pelvis: no acute finding. Diverticulosis without diverticulitis  In ED: given 500cc IVF bolus, zofran protonix and bentyl. [facility] asked to admit.    Assessment and Plan:  Intractable nausea and vomiting 57 year old presenting with 4 day  history of intractable nausea/vomiting in setting of intermittent nausea/vomiting since July of 2024.  -hx of gastric ulcers  -followed by GI and EGD in 09/2022 non bleeding gastric  ulcer in antrum, gastritis, mucosal polyp. H.pylori tested: negative  - increased protonix to BID  -CT abdomen/pelvis negative for acute findings  -note from GI in October said consider gastric emptying study if symptoms persist- 11/5 gastric emptying study order placed, follow report.   -gentle IVF -zofran PRN  -trial reglan BID  -UDS positive for opiates and benzo TSH 1.2 WNL 11/4 started Carafate 1 g p.o. 3 times daily, - celiac panel negative -Follow H. pylori stool antigen   # Metabolic acidosis due to intractable vomiting. Bicarb 14---24 resolved, s/p Bicarb IV infusion, dc'd on 11/5 Monitor electrolytes daily    AKI (acute kidney injury) (HCC) Creatinine of 1.44 in setting of poor PO intake x 4 days and intractable nausea/vomiting  Received small IVF bolus in continue time limited IVF since can not tolerate oral intake Avoid nephrotoxic drugs Trend  Cr 1.17--1.08 improving  Leukocytosis/thrombocytosis Dating back to 2018, followed by hematology Thought 2/2 smoking, but work up done in 09/2022-BCR/ABL negative  Hx of IDA and thought thrombocytosis secondary to this, on oral iron and has f/u with hematology for more testing  Stable and she state she has a f/u with them soon    GERD (gastroesophageal reflux disease) Hx of gastric ulcers.  EGD in 09/2022: non bleeding gastric ulcer in antrum, gastritis, mucosal polyp. H.pylori tested: negative  D/c on protonix BID, now on daily, increase back to protonix BID    Essential hypertension Well controlled, continue norvasc 5mg  daily  Anxiety and depression Continue prozac, remeron and hydroxyzine prn    Chronic insomnia Continue restoril PRN    Constipation, started MiraLAX and Dulcolax 11/5 Dulcolax suppository given, if no BM then enema will be given in the evening. Follow BM and titrate medication accordingly  Body mass index is 29.18 kg/m.  Interventions:  Diet: Regular diet  DVT Prophylaxis: Subcutaneous  Lovenox   Advance goals of care discussion: Full code  Family Communication: family was present at bedside, at the time of interview.  The pt provided permission to discuss medical plan with the family. Opportunity was given to ask question and all questions were answered satisfactorily.   Disposition:  Pt is from home, admitted with intractable nausea and vomiting, found to have metabolic acidosis, s/p bicarb IV infusion, still has Abd pain, and constipation, gastric emptying study pending, which precludes a safe discharge. Discharge to home, when stable, may need few days to improve..  Subjective: No significant events overnight, as per patient she did not sleep well last night, still having epigastric pain, feels nauseous, no vomiting.  Patient has not had bowel movement since 5 days.   Physical Exam: General: NAD, lying comfortably Appear in no distress, affect appropriate Eyes: PERRLA ENT: Oral Mucosa Clear, moist  Neck: no JVD,  Cardiovascular: S1 and S2 Present, no Murmur,  Respiratory: good respiratory effort, Bilateral Air entry equal and Decreased, no Crackles, no wheezes Abdomen: Bowel Sound present, Soft and epigastric tenderness,  Skin: no rashes Extremities: no Pedal edema, no calf tenderness Neurologic: without any new focal findings Gait not checked due to patient safety concerns  Vitals:   01/19/23 1653 01/19/23 1921 01/20/23 0315 01/20/23 0718  BP: (!) 142/85 131/68 135/85 134/80  Pulse: (!) 101 79 69 78  Resp: 18 18 18    Temp:  98.5 F (36.9 C) 98.4 F (36.9 C) 98.4 F (36.9 C)  TempSrc:  Oral Oral Oral  SpO2: 96% 96% 93% 92%  Weight:      Height:        Intake/Output Summary (Last 24 hours) at 01/20/2023 1240 Last data filed at 01/19/2023 1800 Gross per 24 hour  Intake 737.31 ml  Output --  Net 737.31 ml   Filed Weights   01/18/23 1534  Weight: 77.1 kg    Data Reviewed: I have personally reviewed and interpreted daily labs, tele strips,  imagings as discussed above. I reviewed all nursing notes, pharmacy notes, vitals, pertinent old records I have discussed plan of care as described above with RN and patient/family.  CBC: Recent Labs  Lab 01/16/23 1058 01/18/23 0847 01/19/23 0647 01/20/23 0525  WBC 11.5* 11.3* 12.7* 11.7*  NEUTROABS 6.4  --   --   --   HGB 12.2 12.4 13.3 11.3*  HCT 37.9 37.5 42.2 34.1*  MCV 84.4 83.1 85.4 82.8  PLT 495* 471* 427* 421*   Basic Metabolic Panel: Recent Labs  Lab 01/16/23 1058 01/18/23 0847 01/18/23 1609 01/18/23 1610 01/19/23 0647 01/20/23 0525  NA 140 138  --   --  137 139  K 4.6 4.0  --   --  4.4 3.6  CL 106 105  --   --  107 106  CO2 24 17*  --   --  14* 24  GLUCOSE 88 74  --   --  83 93  BUN 11 13  --   --  13 15  CREATININE 0.92 1.44*  --   --  1.17* 1.08*  CALCIUM 9.4 9.7  --   --  9.3 8.7*  MG  --   --   --  1.7  --  1.6*  PHOS  --   --  3.9  --   --  2.8    Studies: No results found.  Scheduled Meds:  amLODipine  5 mg Oral Daily   bisacodyl  10 mg Oral QHS   dicyclomine  20 mg Oral BID   enoxaparin (LOVENOX) injection  40 mg Subcutaneous Q24H   feeding supplement  237 mL Oral BID BM   FLUoxetine  20 mg Oral Daily   metoCLOPramide (REGLAN) injection  5 mg Intravenous Q12H   mirtazapine  15 mg Oral QHS   nicotine  14 mg Transdermal Daily   pantoprazole (PROTONIX) IV  40 mg Intravenous Q12H   polyethylene glycol  17 g Oral BID   pregabalin  75 mg Oral BID   sorbitol, milk of mag, mineral oil, glycerin (SMOG) enema  960 mL Rectal Once   sucralfate  1 g Oral TID with meals   temazepam  30 mg Oral QHS   Continuous Infusions:   PRN Meds: acetaminophen **OR** acetaminophen, bisacodyl, hydrOXYzine, morphine injection, ondansetron **OR** ondansetron (ZOFRAN) IV, traZODone  Time spent: 55 minutes  Author: Gillis Santa. MD [facility] 01/20/2023 12:40 PM  To reach On-call, see care teams to locate the attending and reach out to them via  [email or web address]. If 7PM-7AM, please contact night-coverage If you still have difficulty reaching the attending provider, please page the [facility] (Director on Call) for [facility] on amion for assistance.

## 2023-01-20 NOTE — Plan of Care (Signed)

## 2023-01-20 NOTE — Progress Notes (Signed)
Pt contacted her GI doctor's office and I spoke with the triage nurse and she said pt has an order for a gastric emptying study and was wondering if we could get it done while she is hospitalized. Notified Dr. Lucianne Muss. New order received. Received phone call from nuclear medicine, reporting pt cannot have anything to eat for hours and since she had breakfast, will wait until morning. Nuclear medicine reported NPO at MN and no pain medication through any routes at least 6 hours before procedure. Notified pt.

## 2023-01-20 NOTE — Patient Outreach (Signed)
  Care Coordination   Late Entry 01/16/23 Name: Natalie Edwards MRN: [id number] DOB: 1965-10-17   Care Coordination Outreach Attempts:  An unsuccessful telephone outreach was attempted today to offer the patient information about available care coordination services.  Follow Up Plan:  Additional outreach attempts will be made to offer the patient care coordination information and services.   Encounter Outcome:  No Answer   Care Coordination Interventions:  No, not indicated    Reece Levy, MSW, LCSW [facility]  [facility], [facility] Licensed Clinical Social Worker Care Coordinator  [phone]

## 2023-01-21 ENCOUNTER — Inpatient Hospital Stay (HOSPITAL_COMMUNITY): Payer: 59

## 2023-01-21 DIAGNOSIS — R1013 Epigastric pain: Secondary | ICD-10-CM

## 2023-01-21 DIAGNOSIS — G8929 Other chronic pain: Secondary | ICD-10-CM | POA: Diagnosis not present

## 2023-01-21 DIAGNOSIS — R112 Nausea with vomiting, unspecified: Secondary | ICD-10-CM | POA: Diagnosis not present

## 2023-01-21 LAB — BASIC METABOLIC PANEL
Anion gap: 4 — ABNORMAL LOW (ref 5–15)
BUN: 10 mg/dL (ref 6–20)
CO2: 30 mmol/L (ref 22–32)
Calcium: 8.8 mg/dL — ABNORMAL LOW (ref 8.9–10.3)
Chloride: 106 mmol/L (ref 98–111)
Creatinine, Ser: 1.14 mg/dL — ABNORMAL HIGH (ref 0.44–1.00)
GFR, Estimated: 56 mL/min — ABNORMAL LOW (ref >60)
Glucose, Bld: 95 mg/dL (ref 70–99)
Potassium: 3.6 mmol/L (ref 3.5–5.1)
Sodium: 140 mmol/L (ref 135–145)

## 2023-01-21 LAB — CBC
HCT: 35.6 % — ABNORMAL LOW (ref 36.0–46.0)
Hemoglobin: 11.5 g/dL — ABNORMAL LOW (ref 12.0–15.0)
MCH: 26.9 pg (ref 26.0–34.0)
MCHC: 32.3 g/dL (ref 30.0–36.0)
MCV: 83.2 fL (ref 80.0–100.0)
Platelets: 482 10*3/uL — ABNORMAL HIGH (ref 150–400)
RBC: 4.28 MIL/uL (ref 3.87–5.11)
RDW: 18 % — ABNORMAL HIGH (ref 11.5–15.5)
WBC: 11.5 10*3/uL — ABNORMAL HIGH (ref 4.0–10.5)
nRBC: 0 % (ref 0.0–0.2)

## 2023-01-21 LAB — MAGNESIUM: Magnesium: 2 mg/dL (ref 1.7–2.4)

## 2023-01-21 LAB — RETICULIN ANTIBODIES, IGA W TITER: Reticulin Ab, IgA: NEGATIVE {titer} (ref <2.5)

## 2023-01-21 LAB — PHOSPHORUS: Phosphorus: 3.1 mg/dL (ref 2.5–4.6)

## 2023-01-21 MED ORDER — PANTOPRAZOLE SODIUM 40 MG PO TBEC
40.0000 mg | DELAYED_RELEASE_TABLET | Freq: Two times a day (BID) | ORAL | Status: DC
  Administered 2023-01-21 – 2023-01-22 (×2): 40 mg via ORAL
  Filled 2023-01-21 (×2): qty 1

## 2023-01-21 MED ORDER — TECHNETIUM TC 99M SULFUR COLLOID
2.0000 | Freq: Once | INTRAVENOUS | Status: AC | PRN
  Administered 2023-01-21: 2 via INTRAVENOUS

## 2023-01-21 MED ORDER — OXYCODONE HCL 5 MG PO TABS
5.0000 mg | ORAL_TABLET | ORAL | Status: DC | PRN
  Administered 2023-01-21 – 2023-01-22 (×3): 5 mg via ORAL
  Filled 2023-01-21 (×3): qty 1

## 2023-01-21 NOTE — Consult Note (Addendum)
Consultation  Referring Provider:  [facility]  Primary Care Physician:  [name], MD Primary Gastroenterologist:  Dr. Chales Abrahams       Reason for Consultation:     Intractable nausea and vomiting  LOS: 2 days          HPI:   Natalie Edwards is a 57 y.o. female with past medical history significant for diverticulitis presents for evaluation of intractable nausea and vomiting recent hospital admission in July for GI bleed with EGD showing nonbleeding gastric ulcer and a nodule in the stomach as well as gastritis with negative biopsies  Upon visit to ED 10/20/2022 she was found to have uncomplicated diverticulitis.  She then had worsening pain and came back to ED with a repeat CT scan showing resolution of diverticulitis.  She then reports being seen in [place] in September with abdominal pain nausea and vomiting and imaging showed mild cardiomegaly.  Patient was then seen in our office October 1 for nausea and vomiting.  Patient was put on pantoprazole 40 Mg once daily, famotidine 20 Mg once daily, Zofran, Bentyl.  Patient presented to ED 11/03 with nausea and vomiting that started 10/31.  CT abdomen pelvis with contrast 11/3 was unrevealing.  Main symptom is nausea associated dizzy spells.  Reported weight loss of 15 pounds in the last few months.  During admission she went for gastric emptying study though did so while on IV Reglan.  Patient states medication she was given at her office visit in early October did not provide any relief.  She states she feels nauseous majority of the day.  Before coming here she did note she went 3 days without a bowel movement and her nausea was exacerbated at this time.  Rare episodes of vomiting.  Nausea is not necessarily with eating.  She does feel better on the Reglan.   PREVIOUS GI WORKUP   09/29/22 EGD - Z-line regular, 38 cm from the incisors. - No gross lesions in the entire esophagus. - Non-bleeding gastric ulcer with no stigmata of bleeding. -  A single mucosal papule (nodule) found in the stomach. Biopsied. - Gastritis. Biopsied. - Normal examined duodenum.   A. STOMACH, ANTRUM NODULE, BIOPSY:  - Polypoid gastric antral mucosa showing marked reactive gastropathy and  features of mucosal prolapse  - Negative for intestinal metaplasia, dysplasia or malignancy   B. STOMACH, RANDOM, BIOPSY:  - Gastric antral and oxyntic mucosa with nonspecific reactive  gastropathy  - Helicobacter pylori-like organisms are not identified on routine HE  stain   Reported colonoscopy in 2018 with [facility]  Past Medical History:  Diagnosis Date   Allergy    Anemia    Diverticulosis    Gall bladder stones 1993   Gastric ulcer    GERD (gastroesophageal reflux disease)    Hypertension    Renal mass     Surgical History:  She  has a past surgical history that includes Abdominal hysterectomy; Cholecystectomy; Esophagogastroduodenoscopy (egd) with propofol (N/A, 09/29/2022); and biopsy (09/29/2022). Family History:  Her family history includes Cancer in her maternal uncle, maternal uncle, and maternal uncle. Social History:   reports that she has been smoking cigarettes. She has a 12.5 pack-year smoking history. She has never used smokeless tobacco. She reports current alcohol use. She reports that she does not use drugs.  Prior to Admission medications   Medication Sig Start Date End Date Taking? Authorizing Provider  acetaminophen (TYLENOL) 500 MG tablet Take 500 mg  by mouth every 6 (six) hours as needed for moderate pain (pain score 4-6).   Yes [provider]  amLODipine (NORVASC) 5 MG tablet Take 1 tablet (5 mg total) by mouth daily. 12/28/22  Yes Sarina Ill, DO  dicyclomine (BENTYL) 20 MG tablet Take 1 tablet (20 mg total) by mouth 2 (two) times daily for 14 days. 01/16/23 01/30/23 Yes Maxwell Marion, PA-C  diphenhydramine-acetaminophen (TYLENOL PM) 25-500 MG TABS tablet Take 1 tablet by mouth at bedtime as  needed (pain).   Yes [provider]  FLUoxetine (PROZAC) 20 MG capsule Take 1 capsule (20 mg total) by mouth daily. 12/28/22  Yes Sarina Ill, DO  hydrOXYzine (ATARAX) 50 MG tablet Take 50 mg by mouth 2 (two) times daily as needed for anxiety (sleep).   Yes [provider]  mirtazapine (REMERON) 15 MG tablet Take 1 tablet (15 mg total) by mouth at bedtime. 12/27/22  Yes Sarina Ill, DO  ondansetron (ZOFRAN) 4 MG tablet Take 1 tablet (4 mg total) by mouth every 8 (eight) hours as needed for nausea. 12/16/22  Yes Meredith Pel, NP  ondansetron (ZOFRAN-ODT) 4 MG disintegrating tablet Take 1 tablet (4 mg total) by mouth every 8 (eight) hours as needed for nausea or vomiting. 01/16/23 02/15/23 Yes Maxwell Marion, PA-C  pantoprazole (PROTONIX) 40 MG tablet Take 1 tablet (40 mg total) by mouth daily. Patient taking differently: Take 40 mg by mouth daily as needed (acid reflux). 12/28/22  Yes Sarina Ill, DO  polyethylene glycol (MIRALAX / GLYCOLAX) 17 g packet Take 17 g by mouth daily as needed for mild constipation. 09/30/22  Yes Sheikh, Omair Latif, DO  pregabalin (LYRICA) 75 MG capsule Take 1 capsule (75 mg total) by mouth 2 (two) times daily. 12/27/22  Yes Sarina Ill, DO  risperiDONE (RISPERDAL) 0.5 MG tablet Take 1 tablet (0.5 mg total) by mouth 2 (two) times daily at 8 am and 4 pm. 12/27/22  Yes Sarina Ill, DO  temazepam (RESTORIL) 30 MG capsule Take 1 capsule (30 mg total) by mouth at bedtime as needed. 11/04/22  Yes Olalere, Adewale A, MD  traZODone (DESYREL) 100 MG tablet Take 1 tablet (100 mg total) by mouth at bedtime as needed for sleep. 12/27/22  Yes Sarina Ill, DO    Current Facility-Administered Medications  Medication Dose Route Frequency Provider Last Rate Last Admin   acetaminophen (TYLENOL) tablet 650 mg  650 mg Oral Q6H PRN Orland Mustard, MD       Or   acetaminophen (TYLENOL) suppository 650  mg  650 mg Rectal Q6H PRN Orland Mustard, MD       amLODipine (NORVASC) tablet 5 mg  5 mg Oral Daily Orland Mustard, MD   5 mg at 01/21/23 1049   bisacodyl (DULCOLAX) EC tablet 10 mg  10 mg Oral QHS Gillis Santa, MD   10 mg at 01/20/23 2023   bisacodyl (DULCOLAX) suppository 10 mg  10 mg Rectal Daily PRN Gillis Santa, MD       dicyclomine (BENTYL) tablet 20 mg  20 mg Oral BID Orland Mustard, MD   20 mg at 01/21/23 1049   enoxaparin (LOVENOX) injection 40 mg  40 mg Subcutaneous Q24H Orland Mustard, MD   40 mg at 01/20/23 1655   feeding supplement (ENSURE ENLIVE / ENSURE PLUS) liquid 237 mL  237 mL Oral BID BM Gillis Santa, MD   237 mL at 01/21/23 1048   FLUoxetine (PROZAC) capsule 20 mg  20 mg Oral Daily Orland Mustard, MD   20 mg at 01/21/23 1049   hydrOXYzine (ATARAX) tablet 50 mg  50 mg Oral BID PRN Orland Mustard, MD       metoCLOPramide (REGLAN) injection 5 mg  5 mg Intravenous Q12H Orland Mustard, MD   5 mg at 01/21/23 1050   mirtazapine (REMERON) tablet 15 mg  15 mg Oral QHS Orland Mustard, MD   15 mg at 01/20/23 2024   morphine (PF) 2 MG/ML injection 2 mg  2 mg Intravenous Q3H PRN Orland Mustard, MD   2 mg at 01/20/23 1655   nicotine (NICODERM CQ - dosed in mg/24 hours) patch 14 mg  14 mg Transdermal Daily Orland Mustard, MD   14 mg at 01/21/23 1048   ondansetron (ZOFRAN) tablet 4 mg  4 mg Oral Q6H PRN Orland Mustard, MD   4 mg at 01/20/23 1655   Or   ondansetron (ZOFRAN) injection 4 mg  4 mg Intravenous Q6H PRN Orland Mustard, MD   4 mg at 01/20/23 0421   oxyCODONE (Oxy IR/ROXICODONE) immediate release tablet 5 mg  5 mg Oral Q4H PRN Leatha Gilding, MD       pantoprazole (PROTONIX) injection 40 mg  40 mg Intravenous Q12H Orland Mustard, MD   40 mg at 01/21/23 1049   polyethylene glycol (MIRALAX / GLYCOLAX) packet 17 g  17 g Oral BID Gillis Santa, MD   17 g at 01/21/23 1048   pregabalin (LYRICA) capsule 75 mg  75 mg Oral BID Orland Mustard, MD   75 mg at 01/21/23 1049   sorbitol,  milk of mag, mineral oil, glycerin (SMOG) enema  960 mL Rectal Once Gillis Santa, MD       sucralfate (CARAFATE) tablet 1 g  1 g Oral TID with meals Gillis Santa, MD   1 g at 01/20/23 1655   temazepam (RESTORIL) capsule 30 mg  30 mg Oral QHS Orland Mustard, MD   30 mg at 01/20/23 2024   traZODone (DESYREL) tablet 100 mg  100 mg Oral QHS PRN Orland Mustard, MD   100 mg at 01/20/23 2024    Allergies as of 01/18/2023 - Review Complete 01/18/2023  Allergen Reaction Noted   Hydroxyzine Other (See Comments) 06/07/2018   Ambien [zolpidem tartrate] Other (See Comments) 06/07/2018    Review of Systems  Constitutional:  Negative for chills, fever and weight loss.  HENT:  Negative for hearing loss.   Eyes:  Negative for blurred vision and double vision.  Respiratory:  Negative for cough and hemoptysis.   Cardiovascular:  Negative for chest pain and palpitations.  Gastrointestinal:  Positive for abdominal pain, constipation, nausea and vomiting. Negative for blood in stool, diarrhea, heartburn and melena.  Genitourinary:  Negative for dysuria and urgency.  Musculoskeletal:  Negative for myalgias and neck pain.  Skin:  Negative for itching and rash.  Neurological:  Negative for seizures and loss of consciousness.  Psychiatric/Behavioral:  Negative for depression and suicidal ideas.        Physical Exam:  Vital signs in last 24 hours: Temp:  [98 F (36.7 C)-98.8 F (37.1 C)] 98.8 F (37.1 C) (11/06 1042) Pulse Rate:  [65-94] 65 (11/06 1042) Resp:  [18] 18 (11/06 0321) BP: (102-138)/(55-73) 138/73 (11/06 1042) SpO2:  [90 %-99 %] 96 % (11/06 1042) Last BM Date : 01/14/23 Last BM recorded by nurses in past 5 days No data recorded  Physical Exam Constitutional:      Appearance: She is  well-developed. She is not ill-appearing.  HENT:     Head: Normocephalic and atraumatic.     Nose: Nose normal. No congestion.     Mouth/Throat:     Mouth: Mucous membranes are dry.  Eyes:     General:  No scleral icterus.    Extraocular Movements: Extraocular movements intact.  Cardiovascular:     Rate and Rhythm: Normal rate and regular rhythm.  Pulmonary:     Effort: Pulmonary effort is normal. No respiratory distress.  Abdominal:     General: There is no distension.     Palpations: Abdomen is soft. There is no mass.     Tenderness: There is no abdominal tenderness. There is no guarding or rebound.     Hernia: No hernia is present.  Musculoskeletal:        General: No swelling. Normal range of motion.     Cervical back: Normal range of motion and neck supple.  Skin:    General: Skin is warm and dry.  Neurological:     General: No focal deficit present.     Mental Status: She is oriented to person, place, and time.  Psychiatric:        Mood and Affect: Mood normal.        Behavior: Behavior normal.        Thought Content: Thought content normal.        Judgment: Judgment normal.      LAB RESULTS: Recent Labs    01/19/23 0647 01/20/23 0525 01/21/23 0438  WBC 12.7* 11.7* 11.5*  HGB 13.3 11.3* 11.5*  HCT 42.2 34.1* 35.6*  PLT 427* 421* 482*   BMET Recent Labs    01/19/23 0647 01/20/23 0525 01/21/23 0438  NA 137 139 140  K 4.4 3.6 3.6  CL 107 106 106  CO2 14* 24 30  GLUCOSE 83 93 95  BUN 13 15 10   CREATININE 1.17* 1.08* 1.14*  CALCIUM 9.3 8.7* 8.8*   LFT No results for input(s): "PROT", "ALBUMIN", "AST", "ALT", "ALKPHOS", "BILITOT", "BILIDIR", "IBILI" in the last 72 hours. PT/INR No results for input(s): "LABPROT", "INR" in the last 72 hours.  STUDIES: No results found.    Impression    Intractable nausea and vomiting EGD July 2024 with gastric ulcer and gastritis, negative biopsies Gastric emptying study pending-though it was done on Reglan so results may not be accurate Suspect this may be multifactorial.  Could be gastroparesis since there is improvement on IV Reglan.  Though she did note her nausea was worse after not having a bowel movement for 3  days so could be related to her chronic constipation.  She feels better today after having a bowel movement and being on IV Reglan   Plan   - Continue IV Reglan - Maximize antacid therapy (PPI twice daily - Recommend MiraLAX 17G daily to optimize bowel regimen - Minimize narcotic use to prevent worsening GI motility  Thank you for your kind consultation, we will continue to follow.   Bayley Leanna Sato  01/21/2023, 10:52 AM    Attending physician's note  I have taken a history, reviewed the chart and examined the patient. I performed a substantive portion of this encounter, including complete performance of at least one of the key components, in conjunction with the APP. I agree with the APP's note, impression and recommendations.    57 year old female with intractable nausea and vomiting, patient was sleeping I went into the room was arousable No vomiting since this morning Continue  PPI twice daily Gastric emptying scan done this morning, await report  Reviewed recent EGD, if has persistent symptoms, can consider repeat EGD to document healing of gastric ulcer and obtain repeat biopsies if needed Continue supportive care Limit narcotic use    Kirtland Bouchard Scherry Ran , MD [phone]

## 2023-01-21 NOTE — Progress Notes (Signed)
PROGRESS NOTE  Natalie Edwards ZOX:[id number] DOB: Sep 17, 1965 DOA: 01/18/2023 PCP: [name], MD   LOS: 2 days   Brief Narrative / Interim history: Natalie Edwards is a 57 y.o. female with medical history significant of HTN, GERD, anxiety and depression, hx of gastric ulcers, insomnia, leukocytosis who presented for intractrable N/V.  She states the nausea started back in July. She states she was hospitalized in July and had a EGD at that time which showed gastric ulcers. She was placed on protonix BID. 2-3 days later she was back in here for diverticulitis. She was seen by GI. She states then in September she was up in [place] and had abdominal pain and nausea/vomiting and went to the Ed. She was not admitted. She has been fine until now. She states her nausea/vomiting started on Thursday. She states she doesn't like throwing up and mainly has nausea. She has only had one episode of vomiting since that time and that was this morning. She just remains nauseated with dizzy spells and a headache.  She reports a total of 15 pound weight loss in the last few months  Subjective / 24h Interval events: She just returned from the gastric emptying study.  She wants to try to eat.  Reports some baseline nausea  Assesement and Plan: Principal Problem:   Intractable nausea and vomiting Active Problems:   AKI (acute kidney injury) (HCC)   Leukocytosis/thrombocytosis   GERD (gastroesophageal reflux disease)   Essential hypertension   Anxiety and depression   Chronic insomnia   Principal problem Intractable nausea and vomiting -this has been going on since July 2024.  She has a history of gastric ulcers as well as diverticulitis.  CT scan of the abdomen pelvis on admission without acute findings, but she continues to have baseline nausea as well as left quadrants abdominal pain -Has received a gastric emptying study, however I see that she has been receiving intravenous Reglan so may not be entirely  accurate -Given recurrent hospitalizations and intermittent symptoms with associated weight loss, GI consulted  Active problems Acute kidney injury-creatinine improved with fluids  Metabolic acidosis-due to vomiting, improved  Essential hypertension-continue amlodipine  Anxiety/depression-continue Prozac, Remeron  Chronic insomnia-on Restoril  Leukocytosis/thrombocytosis -follows with hematology as an outpatient  Scheduled Meds:  amLODipine  5 mg Oral Daily   bisacodyl  10 mg Oral QHS   dicyclomine  20 mg Oral BID   enoxaparin (LOVENOX) injection  40 mg Subcutaneous Q24H   feeding supplement  237 mL Oral BID BM   FLUoxetine  20 mg Oral Daily   metoCLOPramide (REGLAN) injection  5 mg Intravenous Q12H   mirtazapine  15 mg Oral QHS   nicotine  14 mg Transdermal Daily   pantoprazole (PROTONIX) IV  40 mg Intravenous Q12H   polyethylene glycol  17 g Oral BID   pregabalin  75 mg Oral BID   sorbitol, milk of mag, mineral oil, glycerin (SMOG) enema  960 mL Rectal Once   sucralfate  1 g Oral TID with meals   temazepam  30 mg Oral QHS   Continuous Infusions: PRN Meds:.acetaminophen **OR** acetaminophen, bisacodyl, hydrOXYzine, morphine injection, ondansetron **OR** ondansetron (ZOFRAN) IV, oxyCODONE, traZODone  Current Outpatient Medications  Medication Instructions   acetaminophen (TYLENOL) 500 mg, Oral, Every 6 hours PRN   amLODipine (NORVASC) 5 mg, Oral, Daily   dicyclomine (BENTYL) 20 mg, Oral, 2 times daily   diphenhydramine-acetaminophen (TYLENOL PM) 25-500 MG TABS tablet 1 tablet, Oral, At bedtime PRN   FLUoxetine (PROZAC)  20 mg, Oral, Daily   hydrOXYzine (ATARAX) 50 mg, Oral, 2 times daily PRN   mirtazapine (REMERON) 15 mg, Oral, Daily at bedtime   ondansetron (ZOFRAN) 4 mg, Oral, Every 8 hours PRN   ondansetron (ZOFRAN-ODT) 4 mg, Oral, Every 8 hours PRN   pantoprazole (PROTONIX) 40 mg, Oral, Daily   polyethylene glycol (MIRALAX / GLYCOLAX) 17 g, Oral, Daily PRN    pregabalin (LYRICA) 75 mg, Oral, 2 times daily   risperiDONE (RISPERDAL) 0.5 mg, Oral, BH-q 8am and 4 pm   temazepam (RESTORIL) 30 mg, Oral, At bedtime PRN   traZODone (DESYREL) 100 mg, Oral, At bedtime PRN    Diet Orders (From admission, onward)     Start     Ordered   01/21/23 1029  Diet regular Fluid consistency: Thin  Diet effective now       Question:  Fluid consistency:  Answer:  Thin   01/21/23 1028            DVT prophylaxis: enoxaparin (LOVENOX) injection 40 mg Start: 01/18/23 1600   Lab Results  Component Value Date   PLT 482 (H) 01/21/2023      Code Status: Limited: Do not attempt resuscitation (DNR) -DNR-LIMITED -Do Not Intubate/DNI   Family Communication: no family at bedside   Status is: Inpatient Remains inpatient appropriate because: severity of illness  Level of care: Med-Surg  Consultants:  GI  Objective: Vitals:   01/20/23 1531 01/20/23 1927 01/21/23 0321 01/21/23 0723  BP: 124/72 128/60 102/70 (!) 115/55  Pulse: 94 90  73  Resp:  18 18   Temp: 98.3 F (36.8 C) 98.1 F (36.7 C) 98 F (36.7 C) 98.5 F (36.9 C)  TempSrc: Oral Oral  Oral  SpO2: 96% 97% 99% 90%  Weight:      Height:        Intake/Output Summary (Last 24 hours) at 01/21/2023 1035 Last data filed at 01/20/2023 2000 Gross per 24 hour  Intake 120 ml  Output 100 ml  Net 20 ml   Wt Readings from Last 3 Encounters:  01/18/23 77.1 kg  01/16/23 78.5 kg  12/23/22 77.8 kg    Examination:  Constitutional: NAD Eyes: no scleral icterus ENMT: Mucous membranes are moist.  Neck: normal, supple Respiratory: clear to auscultation bilaterally, no wheezing, no crackles. Normal respiratory effort.  Cardiovascular: Regular rate and rhythm, no murmurs / rubs / gallops. No LE edema.  Abdomen: non distended, no tenderness. Bowel sounds positive.  Musculoskeletal: no clubbing / cyanosis.    Data Reviewed: I have independently reviewed following labs and imaging studies    CBC Recent Labs  Lab 01/16/23 1058 01/18/23 0847 01/19/23 0647 01/20/23 0525 01/21/23 0438  WBC 11.5* 11.3* 12.7* 11.7* 11.5*  HGB 12.2 12.4 13.3 11.3* 11.5*  HCT 37.9 37.5 42.2 34.1* 35.6*  PLT 495* 471* 427* 421* 482*  MCV 84.4 83.1 85.4 82.8 83.2  MCH 27.2 27.5 26.9 27.4 26.9  MCHC 32.2 33.1 31.5 33.1 32.3  RDW 18.3* 18.0* 18.6* 18.0* 18.0*  LYMPHSABS 4.1*  --   --   --   --   MONOABS 0.8  --   --   --   --   EOSABS 0.1  --   --   --   --   BASOSABS 0.0  --   --   --   --     Recent Labs  Lab 01/16/23 1058 01/18/23 0847 01/18/23 1610 01/19/23 0647 01/20/23 0525 01/21/23 0438  NA 140  138  --  137 139 140  K 4.6 4.0  --  4.4 3.6 3.6  CL 106 105  --  107 106 106  CO2 24 17*  --  14* 24 30  GLUCOSE 88 74  --  83 93 95  BUN 11 13  --  13 15 10   CREATININE 0.92 1.44*  --  1.17* 1.08* 1.14*  CALCIUM 9.4 9.7  --  9.3 8.7* 8.8*  AST 17 16  --   --   --   --   ALT 19 16  --   --   --   --   ALKPHOS 56 56  --   --   --   --   BILITOT 0.6 0.7  --   --   --   --   ALBUMIN 4.0 3.9  --   --   --   --   MG  --   --  1.7  --  1.6* 2.0  TSH  --   --  1.202  --   --   --     ------------------------------------------------------------------------------------------------------------------ No results for input(s): "CHOL", "HDL", "LDLCALC", "TRIG", "CHOLHDL", "LDLDIRECT" in the last 72 hours.  Lab Results  Component Value Date   HGBA1C 5.7 (H) 12/13/2021   ------------------------------------------------------------------------------------------------------------------ Recent Labs    01/18/23 1610  TSH 1.202    Cardiac Enzymes No results for input(s): "CKMB", "TROPONINI", "MYOGLOBIN" in the last 168 hours.  Invalid input(s): "CK" ------------------------------------------------------------------------------------------------------------------ No results found for: "BNP"  CBG: No results for input(s): "GLUCAP" in the last 168 hours.  No results found for this or  any previous visit (from the past 240 hour(s)).   Radiology Studies: No results found.   Pamella Pert, MD, PhD [facility]  Between 7 am - 7 pm I am available, please contact me via Amion (for emergencies) or Securechat (non urgent messages)  Between 7 pm - 7 am I am not available, please contact night coverage MD/APP via Amion

## 2023-01-21 NOTE — Progress Notes (Signed)
Patient was taken for the procedure at 0730 AM and brought back on the floor at 1015 AM via bed. Alert and Oriented. On RA. Will continue to monitor

## 2023-01-21 NOTE — Plan of Care (Signed)
  Problem: Activity: Goal: Risk for activity intolerance will decrease Outcome: Progressing   Problem: Nutrition: Goal: Adequate nutrition will be maintained Outcome: Progressing   Problem: Coping: Goal: Level of anxiety will decrease Outcome: Progressing   Problem: Elimination: Goal: Will not experience complications related to bowel motility Outcome: Progressing   Problem: Pain Management: Goal: General experience of comfort will improve Outcome: Progressing

## 2023-01-21 NOTE — Progress Notes (Signed)
Mobility Specialist Progress Note:   01/21/23 1500  Mobility  Activity Ambulated with assistance in hallway  Level of Assistance Contact guard assist, steadying assist  Assistive Device None  Distance Ambulated (ft) 250 ft  Activity Response Tolerated well  Mobility Referral Yes  $Mobility charge 1 Mobility  Mobility Specialist Start Time (ACUTE ONLY) 1445  Mobility Specialist Stop Time (ACUTE ONLY) 1455  Mobility Specialist Time Calculation (min) (ACUTE ONLY) 10 min   Pt received in bed, agreeable to mobility session. Required CGA for safety. Tolerated well, c/o dizziness (rated 7/10). Returned pt to room, all needs met.   Feliciana Rossetti Mobility Specialist Please contact via Special educational needs teacher or  Rehab office at [phone]

## 2023-01-22 ENCOUNTER — Telehealth: Payer: Self-pay | Admitting: *Deleted

## 2023-01-22 DIAGNOSIS — K5900 Constipation, unspecified: Secondary | ICD-10-CM

## 2023-01-22 DIAGNOSIS — R112 Nausea with vomiting, unspecified: Secondary | ICD-10-CM | POA: Diagnosis not present

## 2023-01-22 LAB — H. PYLORI ANTIGEN, STOOL: H. Pylori Stool Ag, Eia: NEGATIVE

## 2023-01-22 LAB — COMPREHENSIVE METABOLIC PANEL
ALT: 19 U/L (ref 0–44)
AST: 19 U/L (ref 15–41)
Albumin: 3 g/dL — ABNORMAL LOW (ref 3.5–5.0)
Alkaline Phosphatase: 45 U/L (ref 38–126)
Anion gap: 13 (ref 5–15)
BUN: 14 mg/dL (ref 6–20)
CO2: 25 mmol/L (ref 22–32)
Calcium: 9 mg/dL (ref 8.9–10.3)
Chloride: 100 mmol/L (ref 98–111)
Creatinine, Ser: 1.28 mg/dL — ABNORMAL HIGH (ref 0.44–1.00)
GFR, Estimated: 49 mL/min — ABNORMAL LOW (ref >60)
Glucose, Bld: 86 mg/dL (ref 70–99)
Potassium: 4.1 mmol/L (ref 3.5–5.1)
Sodium: 138 mmol/L (ref 135–145)
Total Bilirubin: 0.2 mg/dL (ref <1.2)
Total Protein: 6.2 g/dL — ABNORMAL LOW (ref 6.5–8.1)

## 2023-01-22 LAB — CBC
HCT: 35.4 % — ABNORMAL LOW (ref 36.0–46.0)
Hemoglobin: 11.3 g/dL — ABNORMAL LOW (ref 12.0–15.0)
MCH: 26.8 pg (ref 26.0–34.0)
MCHC: 31.9 g/dL (ref 30.0–36.0)
MCV: 84.1 fL (ref 80.0–100.0)
Platelets: 414 10*3/uL — ABNORMAL HIGH (ref 150–400)
RBC: 4.21 MIL/uL (ref 3.87–5.11)
RDW: 18.6 % — ABNORMAL HIGH (ref 11.5–15.5)
WBC: 10 10*3/uL (ref 4.0–10.5)
nRBC: 0 % (ref 0.0–0.2)

## 2023-01-22 LAB — MAGNESIUM: Magnesium: 1.8 mg/dL (ref 1.7–2.4)

## 2023-01-22 LAB — PHOSPHORUS: Phosphorus: 3.8 mg/dL (ref 2.5–4.6)

## 2023-01-22 MED ORDER — POLYETHYLENE GLYCOL 3350 17 G PO PACK: 17.0000 g | PACK | Freq: Every day | ORAL | 0 refills | Status: DC

## 2023-01-22 MED ORDER — METOCLOPRAMIDE HCL 5 MG PO TABS
5.0000 mg | ORAL_TABLET | Freq: Three times a day (TID) | ORAL | 1 refills | Status: DC | PRN
Start: 2023-01-22 — End: 2023-03-10

## 2023-01-22 MED ORDER — PROCHLORPERAZINE MALEATE 10 MG PO TABS
10.0000 mg | ORAL_TABLET | Freq: Four times a day (QID) | ORAL | 0 refills | Status: DC | PRN
Start: 2023-01-22 — End: 2023-05-24

## 2023-01-22 NOTE — Progress Notes (Signed)
01/22/2023  Natalie Edwards DOB: 1965-08-25 MRN: [id number]   RIDER WAIVER AND RELEASE OF LIABILITY  For the purposes of helping with transportation needs, [facility] partners with outside transportation providers (taxi companies, [place], Catering manager.) to give [employer] patients or other approved people the choice of on-demand rides [employer]") to our buildings for non-emergency visits.  By using [employer], I, the person signing this document, on behalf of myself and/or any legal minors (in my care using the [employer]), agree:  Science writer given to me are supplied by independent, outside transportation providers who do not work for, or have any affiliation with, [employer]. [place] is not a transportation company. [facility] has no control over the quality or safety of the rides I get using [employer]. [facility] has no control over whether any outside ride will happen on time or not. [place] [facility] gives no guarantee on the reliability, quality, safety, or availability on any rides, or that no mistakes will happen. I know and accept that traveling by vehicle (car, truck, SVU, Zenaida Niece, bus, taxi, etc.) has risks of serious injuries such as disability, being paralyzed, and death. I know and agree the risk of using [employer] is mine alone, and not [employer]. Transport Services are provided "as is" and as are available. The transportation providers are in charge for all inspections and care of the vehicles used to provide these rides. I agree not to take legal action against [facility], its agents, employees, officers, directors, representatives, insurers, attorneys, assigns, successors, subsidiaries, and affiliates at any time for any reasons related directly or indirectly to using [employer]. I also agree not to take legal action against  or its affiliates for any injury, death, or damage to property caused by or related to using  [employer]. I have read this Waiver and Release of Liability, and I understand the terms used in it and their legal meaning. This Waiver is freely and voluntarily given with the understanding that my right (or any legal minors) to legal action against  relating to [employer] is knowingly given up to use these services.   I attest that I read the Ride Waiver and Release of Liability to Natalie Edwards, gave Ms. Ipock the opportunity to ask questions and answered the questions asked (if any). I affirm that Linzey Ramser then provided consent for assistance with transportation.

## 2023-01-22 NOTE — Care Management Important Message (Signed)
Important Message  Patient Details  Name: Natalie Edwards MRN: [id number] Date of Birth: January 26, 1966   Important Message Given:  Yes - Medicare IM     Dorena Bodo 01/22/2023, 2:41 PM

## 2023-01-22 NOTE — Consult Note (Signed)
[facility] Liaison Consult Note    01/22/2023  Natalie Edwards 09/20/1965 [id number]  Insurance:  [employer]  Primary Care Provider:  [name], MD with Primary Care at [facility], this provider is listed to provide the community transition of care follow up and [facility] calls  [facility] [[facility]] remote coverage review for patient admitted to [facility] with call attempts as patient is active prior to admission  Patient is currently active with [facility] for care coordination services.  Patient has been engaged by a Tourist information centre manager.  The community based plan of care has focused on disease management and community resource support.    Patient will receive a post hospital call and will be evaluated for assessments and disease process education.    Plan: Will update VBCI Social Worker of disposition and hospitalization.    Of note, [facility] services does not replace or interfere with any services that are needed or arranged by inpatient [facility] care management team.   Charlesetta Shanks, RN, BSN, CCM [place]  [facility], [facility] Liaison Direct Dial: [phone] or secure chat Email: [email or web address]

## 2023-01-22 NOTE — Progress Notes (Signed)
Mobility Specialist Progress Note:    01/22/23 0929  Mobility  Activity Refused mobility   Pt politely declined mobility d/t nausea. RN notified. Left pt with all needs met, will f/u if time permits.   Feliciana Rossetti Mobility Specialist Please contact via Special educational needs teacher or  Rehab office at [phone]

## 2023-01-22 NOTE — Discharge Summary (Signed)
Physician Discharge Summary  Natalie Edwards WUJ:[id number] DOB: 1965/06/11 DOA: 01/18/2023  PCP: [name], MD  Admit date: 01/18/2023 Discharge date: 01/22/2023  Admitted From: home Disposition:  home  Recommendations for Outpatient Follow-up:  Follow up with PCP in 1-2 weeks Follow-up with gastroenterology as an outpatient as scheduled  Home Health: none Equipment/Devices: none  Discharge Condition: stable CODE STATUS: Full code Diet Orders (From admission, onward)     Start     Ordered   01/21/23 1029  Diet regular Fluid consistency: Thin  Diet effective now       Question:  Fluid consistency:  Answer:  Thin   01/21/23 1028            HPI: Per admitting MD, Natalie Edwards is a 57 y.o. female with medical history significant of HTN, GERD, anxiety and depression, hx of gastric ulcers, insomnia, leukocytosis who presented for intractrable N/V.  She states the nausea started back in July. She states she was hospitalized in July and had a EGD at that time which showed gastric ulcers. She was placed on protonix BID. 2-3 days later she was back in here for diverticulitis. She was seen by GI. She states then in September she was up in [place] and had abdominal pain and nausea/vomiting and went to the Ed. She was not admitted. She has been fine until now. She states her nausea/vomiting started on Thursday. She states she doesn't like throwing up and mainly has nausea. She has only had one episode of vomiting since that time and that was this morning. She just remains nauseated with dizzy spells and a headache. She is not eating due to the nausea. She denies any sick contacts. Denies any diarrhea. No fever/chills.   Denies any fever/chills, headaches, chest pain or palpitations, shortness of breath or cough, dysuria or leg swelling.  She smokes 1PP 3 days no alcohol.   Hospital Course / Discharge diagnoses: Principal Problem:   Intractable nausea and vomiting Active Problems:   AKI (acute  kidney injury) (HCC)   Leukocytosis/thrombocytosis   GERD (gastroesophageal reflux disease)   Essential hypertension   Anxiety and depression   Chronic insomnia   Principal problem Intractable nausea and vomiting -this has been going on since July 2024.  She has a history of gastric ulcers as well as diverticulitis.  CT scan of the abdomen pelvis on admission without acute findings, but she continues to have baseline nausea as well as left quadrants abdominal pain. Has received a gastric emptying study, however I see that she has been receiving intravenous Reglan so may not be entirely accurate.  Given slow to improve, gastroenterology was consulted and evaluated patient while hospitalized.  She improved with supportive treatment, also, importantly, control of her constipation.  She will be discharged home in stable condition with outpatient GI follow-up   Active problems Acute kidney injury-creatinine improved with fluids Metabolic acidosis-due to vomiting, improved Essential hypertension-continue amlodipine Anxiety/depression-continue Prozac, Remeron Chronic insomnia-on Restoril Leukocytosis/thrombocytosis -follows with hematology as an outpatient  Sepsis ruled out   Discharge Instructions   Allergies as of 01/22/2023       Reactions   Hydroxyzine Other (See Comments)   Palpitations and jitteriness    Ambien [zolpidem Tartrate] Other (See Comments)   Jitteriness, nervousness, abdominal pain        Medication List     STOP taking these medications    ondansetron 4 MG disintegrating tablet Commonly known as: ZOFRAN-ODT   ondansetron 4 MG tablet Commonly known  as: ZOFRAN       TAKE these medications    acetaminophen 500 MG tablet Commonly known as: TYLENOL Take 500 mg by mouth every 6 (six) hours as needed for moderate pain (pain score 4-6).   amLODipine 5 MG tablet Commonly known as: NORVASC Take 1 tablet (5 mg total) by mouth daily.   dicyclomine 20 MG  tablet Commonly known as: BENTYL Take 1 tablet (20 mg total) by mouth 2 (two) times daily for 14 days.   diphenhydramine-acetaminophen 25-500 MG Tabs tablet Commonly known as: TYLENOL PM Take 1 tablet by mouth at bedtime as needed (pain).   FLUoxetine 20 MG capsule Commonly known as: PROZAC Take 1 capsule (20 mg total) by mouth daily.   hydrOXYzine 50 MG tablet Commonly known as: ATARAX Take 50 mg by mouth 2 (two) times daily as needed for anxiety (sleep).   metoCLOPramide 5 MG tablet Commonly known as: Reglan Take 1 tablet (5 mg total) by mouth every 8 (eight) hours as needed for nausea.   mirtazapine 15 MG tablet Commonly known as: REMERON Take 1 tablet (15 mg total) by mouth at bedtime.   pantoprazole 40 MG tablet Commonly known as: PROTONIX Take 1 tablet (40 mg total) by mouth daily. What changed:  when to take this reasons to take this   polyethylene glycol 17 g packet Commonly known as: MIRALAX / GLYCOLAX Take 17 g by mouth daily. What changed:  when to take this reasons to take this   pregabalin 75 MG capsule Commonly known as: LYRICA Take 1 capsule (75 mg total) by mouth 2 (two) times daily.   prochlorperazine 10 MG tablet Commonly known as: COMPAZINE Take 1 tablet (10 mg total) by mouth every 6 (six) hours as needed for nausea or vomiting.   risperiDONE 0.5 MG tablet Commonly known as: RISPERDAL Take 1 tablet (0.5 mg total) by mouth 2 (two) times daily at 8 am and 4 pm.   temazepam 30 MG capsule Commonly known as: RESTORIL Take 1 capsule (30 mg total) by mouth at bedtime as needed.   traZODone 100 MG tablet Commonly known as: DESYREL Take 1 tablet (100 mg total) by mouth at bedtime as needed for sleep.        Follow-up Information     [name], MD Follow up in 1 week(s).   Specialty: Family Medicine Contact information: [address] [place] [address] [phone]                  Consultations: Gastroenterology   Procedures/Studies: none  NM GASTRIC EMPTYING  Result Date: 01/21/2023 CLINICAL DATA:  Concern for gastroparesis. EXAM: NUCLEAR MEDICINE GASTRIC EMPTYING SCAN TECHNIQUE: After oral ingestion of radiolabeled meal, sequential abdominal images were obtained for 120 minutes. Residual percentage of activity remaining within the stomach was calculated at 60 and 120 minutes. RADIOPHARMACEUTICALS:  2.0 mCi Tc-52m sulfur colloid in standardized meal COMPARISON:  CT January 18, 2023 FINDINGS: Expected location of the stomach in the left upper quadrant. Ingested meal empties the stomach gradually over the course of the study with 31% retention at 60 min and 5% retention at 120 min (normal retention less than 30% at a 120 min). IMPRESSION: Normal gastric emptying study. Electronically Signed   By: Maudry Mayhew M.D.   On: 01/21/2023 15:02   CT ABDOMEN PELVIS W CONTRAST  Result Date: 01/18/2023 CLINICAL DATA:  Left upper quadrant/epigastric pain EXAM: CT ABDOMEN AND PELVIS WITH CONTRAST TECHNIQUE: Multidetector CT imaging of the abdomen  and pelvis was performed using the standard protocol following bolus administration of intravenous contrast. RADIATION DOSE REDUCTION: This exam was performed according to the departmental dose-optimization program which includes automated exposure control, adjustment of the mA and/or kV according to patient size and/or use of iterative reconstruction technique. CONTRAST:  75mL OMNIPAQUE IOHEXOL 350 MG/ML SOLN COMPARISON:  CT abdomen/pelvis 10/21/2022 FINDINGS: Lower chest: The lung bases are clear. The imaged heart is unremarkable. Hepatobiliary: The liver is unremarkable. The gallbladder is surgically absent, likely accounting for the prominent intra and extrahepatic bile ducts. Pancreas: Unremarkable. Spleen: Unremarkable. Adrenals/Urinary Tract: A left adrenal nodule is unchanged requiring no specific imaging follow-up. The right adrenal is  unremarkable. Bilateral renal cysts are again noted requiring no specific imaging follow-up. The kidneys enhance symmetrically. There are no suspicious lesions. There are no stones in either kidney or along the course of either ureter. There is symmetric excretion of contrast into the collecting systems on the delayed images. The bladder is unremarkable. Stomach/Bowel: The stomach is unremarkable. There is no evidence of bowel obstruction. There is no abnormal bowel wall thickening or inflammatory change. The appendix is normal. There is colonic diverticulosis without evidence of acute diverticulitis. Vascular/Lymphatic: There is mild calcified plaque in the nonaneurysmal abdominal aorta. The major branch vessels are patent. The main portal and splenic veins are patent. There is no abdominal or pelvic lymphadenopathy. Reproductive: The uterus is surgically absent. There is no adnexal mass. Other: There is no ascites or free air. Musculoskeletal: There is no acute osseous abnormality or suspicious osseous lesion. IMPRESSION: 1. No acute finding in the abdomen or pelvis. 2. Colonic diverticulosis without evidence of acute diverticulitis. Electronically Signed   By: Lesia Hausen M.D.   On: 01/18/2023 10:24   DG Abdomen Acute W/Chest  Result Date: 01/16/2023 CLINICAL DATA:  Abdominal pain and vomiting for 2 days EXAM: DG ABDOMEN ACUTE WITH 1 VIEW CHEST COMPARISON:  Abdominal x-ray series 10/24/2022 and older CT scans. FINDINGS: Hyperinflation. No consolidation, pneumothorax or effusion. No edema. Normal cardiopericardial silhouette. Gas seen in nondilated loops of small and large bowel. Scattered stool. Presumed colonic diverticula. Air towards the rectum. Surgical clips in the right upper quadrant. No definite free air seen beneath the diaphragm on the upright view. IMPRESSION: No acute cardiopulmonary disease. Nonspecific bowel gas pattern. Scattered stool and presumed diverticula. Previous cholecystectomy  Electronically Signed   By: Karen Kays M.D.   On: 01/16/2023 13:29     Subjective: - no chest pain, shortness of breath, no abdominal pain, nausea or vomiting.   Discharge Exam: BP 102/65 (BP Location: Left Arm)   Pulse 72   Temp 98.3 F (36.8 C) (Oral)   Resp 17   Ht 5\' 4"  (1.626 m)   Wt 77.1 kg   SpO2 93%   BMI 29.18 kg/m   General: Pt is alert, awake, not in acute distress Cardiovascular: RRR, S1/S2 +, no rubs, no gallops Respiratory: CTA bilaterally, no wheezing, no rhonchi Abdominal: Soft, NT, ND, bowel sounds + Extremities: no edema, no cyanosis    The results of significant diagnostics from this hospitalization (including imaging, microbiology, ancillary and laboratory) are listed below for reference.     Microbiology: No results found for this or any previous visit (from the past 240 hour(s)).   Labs: Basic Metabolic Panel: Recent Labs  Lab 01/18/23 0847 01/18/23 1609 01/18/23 1610 01/19/23 0647 01/20/23 0525 01/21/23 0438 01/22/23 0643  NA 138  --   --  137 139 140 138  K  4.0  --   --  4.4 3.6 3.6 4.1  CL 105  --   --  107 106 106 100  CO2 17*  --   --  14* 24 30 25   GLUCOSE 74  --   --  83 93 95 86  BUN 13  --   --  13 15 10 14   CREATININE 1.44*  --   --  1.17* 1.08* 1.14* 1.28*  CALCIUM 9.7  --   --  9.3 8.7* 8.8* 9.0  MG  --   --  1.7  --  1.6* 2.0 1.8  PHOS  --  3.9  --   --  2.8 3.1 3.8   Liver Function Tests: Recent Labs  Lab 01/16/23 1058 01/18/23 0847 01/22/23 0643  AST 17 16 19   ALT 19 16 19   ALKPHOS 56 56 45  BILITOT 0.6 0.7 0.2  PROT 8.0 7.8 6.2*  ALBUMIN 4.0 3.9 3.0*   CBC: Recent Labs  Lab 01/16/23 1058 01/18/23 0847 01/19/23 0647 01/20/23 0525 01/21/23 0438 01/22/23 0643  WBC 11.5* 11.3* 12.7* 11.7* 11.5* 10.0  NEUTROABS 6.4  --   --   --   --   --   HGB 12.2 12.4 13.3 11.3* 11.5* 11.3*  HCT 37.9 37.5 42.2 34.1* 35.6* 35.4*  MCV 84.4 83.1 85.4 82.8 83.2 84.1  PLT 495* 471* 427* 421* 482* 414*   CBG: No  results for input(s): "GLUCAP" in the last 168 hours. Hgb A1c No results for input(s): "HGBA1C" in the last 72 hours. Lipid Profile No results for input(s): "CHOL", "HDL", "LDLCALC", "TRIG", "CHOLHDL", "LDLDIRECT" in the last 72 hours. Thyroid function studies No results for input(s): "TSH", "T4TOTAL", "T3FREE", "THYROIDAB" in the last 72 hours.  Invalid input(s): "FREET3" Urinalysis    Component Value Date/Time   COLORURINE STRAW (A) 01/18/2023 1045   APPEARANCEUR CLEAR 01/18/2023 1045   LABSPEC 1.042 (H) 01/18/2023 1045   PHURINE 5.0 01/18/2023 1045   GLUCOSEU NEGATIVE 01/18/2023 1045   HGBUR NEGATIVE 01/18/2023 1045   BILIRUBINUR NEGATIVE 01/18/2023 1045   BILIRUBINUR negative 06/30/2018 1005   KETONESUR 20 (A) 01/18/2023 1045   PROTEINUR NEGATIVE 01/18/2023 1045   UROBILINOGEN 0.2 06/30/2018 1005   NITRITE NEGATIVE 01/18/2023 1045   LEUKOCYTESUR NEGATIVE 01/18/2023 1045    FURTHER DISCHARGE INSTRUCTIONS:   Get Medicines reviewed and adjusted: Please take all your medications with you for your next visit with your Primary MD   Laboratory/radiological data: Please request your Primary MD to go over all hospital tests and procedure/radiological results at the follow up, please ask your Primary MD to get all Hospital records sent to his/her office.   In some cases, they will be blood work, cultures and biopsy results pending at the time of your discharge. Please request that your primary care M.D. goes through all the records of your hospital data and follows up on these results.   Also Note the following: If you experience worsening of your admission symptoms, develop shortness of breath, life threatening emergency, suicidal or homicidal thoughts you must seek medical attention immediately by calling 911 or calling your MD immediately  if symptoms less severe.   You must read complete instructions/literature along with all the possible adverse reactions/side effects for all  the Medicines you take and that have been prescribed to you. Take any new Medicines after you have completely understood and accpet all the possible adverse reactions/side effects.    Do not drive when taking Pain medications or  sleeping medications (Benzodaizepines)   Do not take more than prescribed Pain, Sleep and Anxiety Medications. It is not advisable to combine anxiety,sleep and pain medications without talking with your primary care practitioner   Special Instructions: If you have smoked or chewed Tobacco  in the last 2 yrs please stop smoking, stop any regular Alcohol  and or any Recreational drug use.   Wear Seat belts while driving.   Please note: You were cared for by a hospitalist during your hospital stay. Once you are discharged, your primary care physician will handle any further medical issues. Please note that NO REFILLS for any discharge medications will be authorized once you are discharged, as it is imperative that you return to your primary care physician (or establish a relationship with a primary care physician if you do not have one) for your post hospital discharge needs so that they can reassess your need for medications and monitor your lab values.  Time coordinating discharge: 35 minutes  SIGNED:  Pamella Pert, MD, PhD 01/22/2023, 3:33 PM

## 2023-01-22 NOTE — Progress Notes (Addendum)
Progress Note   LOS: 3 days   Chief Complaint:intractable nausea and vomiting   Subjective   At the time of my evaluation patient stated her nausea has exponentially improved and she was eating her breakfast without difficulty   Objective   Vital signs in last 24 hours: Temp:  [97.5 F (36.4 C)-98.3 F (36.8 C)] 98.3 F (36.8 C) (11/07 0803) Pulse Rate:  [69-90] 72 (11/07 0803) Resp:  [17-18] 17 (11/07 0803) BP: (96-107)/(59-65) 102/65 (11/07 0803) SpO2:  [93 %-96 %] 93 % (11/07 0803) Last BM Date : 01/20/23 Last BM recorded by nurses in past 5 days No data recorded  General:   female in no acute distress  Heart:  Regular rate and rhythm; no murmurs Pulm: Clear anteriorly; no wheezing Abdomen: soft, nondistended, normal bowel sounds in all quadrants. Nontender without guarding. No organomegaly appreciated. Extremities:  No edema Neurologic:  Alert and  oriented x4;  No focal deficits.  Psych:  Cooperative. Normal mood and affect.  Intake/Output from previous day: 11/06 0701 - 11/07 0700 In: 240 [P.O.:240] Out: -  Intake/Output this shift: No intake/output data recorded.  Studies/Results: NM GASTRIC EMPTYING  Result Date: 01/21/2023 CLINICAL DATA:  Concern for gastroparesis. EXAM: NUCLEAR MEDICINE GASTRIC EMPTYING SCAN TECHNIQUE: After oral ingestion of radiolabeled meal, sequential abdominal images were obtained for 120 minutes. Residual percentage of activity remaining within the stomach was calculated at 60 and 120 minutes. RADIOPHARMACEUTICALS:  2.0 mCi Tc-45m sulfur colloid in standardized meal COMPARISON:  CT January 18, 2023 FINDINGS: Expected location of the stomach in the left upper quadrant. Ingested meal empties the stomach gradually over the course of the study with 31% retention at 60 min and 5% retention at 120 min (normal retention less than 30% at a 120 min). IMPRESSION: Normal gastric emptying study. Electronically Signed   By: Maudry Mayhew M.D.   On:  01/21/2023 15:02    Lab Results: Recent Labs    01/20/23 0525 01/21/23 0438 01/22/23 0643  WBC 11.7* 11.5* 10.0  HGB 11.3* 11.5* 11.3*  HCT 34.1* 35.6* 35.4*  PLT 421* 482* 414*   BMET Recent Labs    01/20/23 0525 01/21/23 0438 01/22/23 0643  NA 139 140 138  K 3.6 3.6 4.1  CL 106 106 100  CO2 24 30 25   GLUCOSE 93 95 86  BUN 15 10 14   CREATININE 1.08* 1.14* 1.28*  CALCIUM 8.7* 8.8* 9.0   LFT Recent Labs    01/22/23 0643  PROT 6.2*  ALBUMIN 3.0*  AST 19  ALT 19  ALKPHOS 45  BILITOT 0.2   PT/INR No results for input(s): "LABPROT", "INR" in the last 72 hours.   Scheduled Meds:  amLODipine  5 mg Oral Daily   bisacodyl  10 mg Oral QHS   dicyclomine  20 mg Oral BID   enoxaparin (LOVENOX) injection  40 mg Subcutaneous Q24H   feeding supplement  237 mL Oral BID BM   FLUoxetine  20 mg Oral Daily   metoCLOPramide (REGLAN) injection  5 mg Intravenous Q12H   mirtazapine  15 mg Oral QHS   nicotine  14 mg Transdermal Daily   pantoprazole  40 mg Oral BID   polyethylene glycol  17 g Oral BID   pregabalin  75 mg Oral BID   sorbitol, milk of mag, mineral oil, glycerin (SMOG) enema  960 mL Rectal Once   sucralfate  1 g Oral TID with meals   temazepam  30 mg Oral QHS  Continuous Infusions:    Impression:   Intractable nausea and vomiting EGD July 2024 with gastric ulcer and gastritis, negative biopsies Gastric emptying study normal - though it was done on Reglan so results may not be accurate Suspect this may be multifactorial.  Could be gastroparesis since there is improvement on IV Reglan.  Though she did note her nausea was worse after not having a bowel movement for 3 days so could be related to her chronic constipation.   At the time of my evaluation patient reported significantly improved nausea and was tolerating diet without difficulty.  Also tolerated diet yesterday without difficulty.  I suspect this will be an ongoing problem for and it is intermittent.   Ideally would have liked to do gastric emptying study outpatient and not on Reglan for improved accuracy   Plan:   --Continue to optimize bowel regimen as an outpatient on MiraLAX once daily - Continue to optimize antacid therapy with PPI twice daily - Continue to minimize narcotic use to prevent worsening GI motility - Continue Reglan - Will set up follow-up as an outpatient for further workup  Natalie Edwards  01/22/2023, 12:39 PM   Attending physician's note   I have taken a history, reviewed the chart and examined the patient. I performed a substantive portion of this encounter, including complete performance of at least one of the key components, in conjunction with the APP. I agree with the APP's note, impression and recommendations.   Nausea is better after she had a bowel movement.  Denies abdominal pain Small frequent meals Bowel regimen with daily MiraLAX to avoid constipation Continue PPI twice daily  Follow-up in GI office as outpatient Okay to DC home from GI standpoint  The patient was provided an opportunity to ask questions and all were answered. The patient agreed with the plan and demonstrated an understanding of the instructions.   Iona Beard , MD [phone]

## 2023-01-22 NOTE — Telephone Encounter (Signed)
-----   Message from Dillard, Natalie Edwards sent at 01/20/2023 10:15 AM EST ----- Pt was a no show for her appt on the 31st. ----- Message ----- From: Avanell Shackleton, RN Sent: 01/20/2023  10:10 AM EST To: Natalie Edwards Pait; Marylynn Pearson  Good morning,  Wondering why this patient has not been scheduled for the Gastric Emptying study as of yet?

## 2023-01-22 NOTE — Telephone Encounter (Signed)
Message Received: 2 days ago Pait, April H  Saanvika Vazques, RN; Marylynn Pearson Pt was a no show for her appt on the 31st.       Previous Messages    ----- Message ----- From: Avanell Shackleton, RN Sent: 01/20/2023  10:10 AM EST To: April H Pait; Marylynn Pearson  Good morning,  Wondering why this patient has not been scheduled for the Gastric Emptying study as of yet?

## 2023-01-22 NOTE — Plan of Care (Signed)
  Problem: Clinical Measurements: Goal: Ability to maintain clinical measurements within normal limits will improve Outcome: Progressing Goal: Will remain free from infection Outcome: Progressing   Problem: Activity: Goal: Risk for activity intolerance will decrease Outcome: Progressing   Problem: Coping: Goal: Level of anxiety will decrease Outcome: Progressing   Problem: Pain Management: Goal: General experience of comfort will improve Outcome: Progressing

## 2023-01-23 ENCOUNTER — Ambulatory Visit: Payer: Self-pay | Admitting: *Deleted

## 2023-01-23 ENCOUNTER — Telehealth: Payer: Self-pay

## 2023-01-23 NOTE — Patient Instructions (Signed)
Visit Information  Thank you for taking time to visit with me today. Please don't hesitate to contact me if I can be of assistance to you.   Following are the goals we discussed today:   Goals Addressed             This Visit's Progress    Reduce stress related to financial strains        Activities and task to complete in order to accomplish goals.   Sign up for renter's insurance asap to reduce your monthly rent fee Continue to consider your options  Call [employer] hotline "211" and local churches, DSS and other organizations provided to you to seek assistance with back rent owed  Await further word from [facility] Disability   Follow up with housing resources discussed (sent you an email for resource/support to prevent your eviction) Call [facility] [phone] to schedule your counseling appointment/paperwork completed. Follow up on medical appointments: Hematology, GI, etc Call [facility] program discussed- # provided for eviction assistance Consider part-time work you may be able to do that is less physical           Our next appointment is by telephone on 02/11/23   Please call the care guide team at [phone] if you need to cancel or reschedule your appointment.   If you are experiencing a Mental Health or Behavioral Health Crisis or need someone to talk to, please call the Suicide and Crisis Lifeline: 988 call 911   The patient verbalized understanding of instructions, educational materials, and care plan provided today and DECLINED offer to receive copy of patient instructions, educational materials, and care plan.   Telephone follow up appointment with care management team member scheduled for: 02/11/23  Reece Levy, MSW, LCSW [place]  [facility], [facility] Licensed Clinical Social Worker Care Coordinator  [phone]

## 2023-01-23 NOTE — Patient Outreach (Addendum)
  Care Coordination   Follow Up Visit Note   01/23/2023 Name: Maire Rzepecki MRN: [id number] DOB: 09-14-1965  Yomira Marinelli is a 57 y.o. year old female who sees [name], MD for primary care. I spoke with  [name] by phone today.  What matters to the patients health and wellness today?  Feeling much better- unemployment approved and weekly checks began.     Goals Addressed             This Visit's Progress    Reduce stress related to financial strains        Activities and task to complete in order to accomplish goals.   Sign up for renter's insurance asap to reduce your monthly rent fee Continue to consider your options  Call [employer] hotline "211" and local churches, DSS and other organizations provided to you to seek assistance with back rent owed  Await further word from [facility] Disability   Follow up with housing resources discussed (sent you an email for resource/support to prevent your eviction) Call [facility] [phone] to schedule your counseling appointment/paperwork completed. Follow up on medical appointments: Hematology, GI, etc Call [facility] program discussed- # provided for eviction assistance Consider part-time work you may be able to do that is less physical           SDOH assessments and interventions completed:  Yes     Care Coordination Interventions:  Yes, provided  Interventions Today    Flowsheet Row Most Recent Value  Chronic Disease   Chronic disease during today's visit Diabetes  General Interventions   General Interventions Discussed/Reviewed Doctor Visits  [Pt was hospitalized earlier this week and now is home-]  Mental Health Interventions   Mental Health Discussed/Reviewed Mental Health Discussed, Crisis  [Pt reports feeling much better now that her unemployment has been approved and received.]       Follow up plan: Follow up call scheduled for 02/11/23    Encounter Outcome:  Patient Visit Completed

## 2023-01-23 NOTE — Telephone Encounter (Signed)
Please see notes below. Test was completed on 01/21/2023.

## 2023-01-23 NOTE — Transitions of Care (Post Inpatient/ED Visit) (Signed)
   01/23/2023  Name: Natalie Edwards MRN: [id number] DOB: [birth year]  Today's TOC FU Call Status: Today's TOC FU Call Status:: Unsuccessful Call (1st Attempt) Unsuccessful Call (1st Attempt) Date: 01/23/23  Attempted to reach the patient regarding the most recent Inpatient/ED visit.  Follow Up Plan: Additional outreach attempts will be made to reach the patient to complete the Transitions of Care (Post Inpatient/ED visit) call.   Alyse Low, RN, BA, [facility], CRRN [facility] Coordinator, Transition of Care Ph # [phone]

## 2023-01-26 ENCOUNTER — Telehealth: Payer: Self-pay

## 2023-01-26 NOTE — Transitions of Care (Post Inpatient/ED Visit) (Signed)
   01/26/2023  Name: Adrea Shuck MRN: [id number] DOB: 11/17/1965  Today's TOC FU Call Status: Today's TOC FU Call Status:: Unsuccessful Call (2nd Attempt) Unsuccessful Call (2nd Attempt) Date: 01/26/23  Attempted to reach the patient regarding the most recent Inpatient/ED visit.  Follow Up Plan: Additional outreach attempts will be made to reach the patient to complete the Transitions of Care (Post Inpatient/ED visit) call.   Alyse Low, RN, BA, [facility], CRRN [facility] Coordinator, Transition of Care Ph # [phone]

## 2023-01-27 ENCOUNTER — Telehealth: Payer: Self-pay

## 2023-01-27 NOTE — Transitions of Care (Post Inpatient/ED Visit) (Signed)
   01/27/2023  Name: Breawna Strohschein MRN: [id number] DOB: [birth year]  Today's TOC FU Call Status: Today's TOC FU Call Status:: Unsuccessful Call (3rd Attempt) Unsuccessful Call (3rd Attempt) Date: 01/27/23  Attempted to reach the patient regarding the most recent Inpatient/ED visit.  Follow Up Plan: No further outreach attempts will be made at this time. We have been unable to contact the patient.  Alyse Low, RN, BA, [facility], CRRN [facility] Coordinator, Transition of Care Ph # [phone]

## 2023-01-28 ENCOUNTER — Ambulatory Visit: Payer: 59 | Admitting: Family Medicine

## 2023-01-28 NOTE — Telephone Encounter (Signed)
The note states they are asking if pts NM GES can be cancelled as she just had one ordered by another provider on 01/21/23. Please advise. Response should go to [employer].

## 2023-01-29 ENCOUNTER — Ambulatory Visit: Payer: 59 | Admitting: Family Medicine

## 2023-02-04 ENCOUNTER — Telehealth: Payer: Self-pay | Admitting: Pulmonary Disease

## 2023-02-04 ENCOUNTER — Other Ambulatory Visit: Payer: Self-pay | Admitting: Pulmonary Disease

## 2023-02-04 NOTE — Telephone Encounter (Signed)
Patient needs refill of Temazeapm.  Pharmacy [facility] on [address]

## 2023-02-04 NOTE — Telephone Encounter (Signed)
Error

## 2023-02-05 ENCOUNTER — Encounter: Payer: Self-pay | Admitting: *Deleted

## 2023-02-05 NOTE — Telephone Encounter (Signed)
ATC x1.  No answer, no vm.   Mychart message sent to patient to let her know a message has been sent to Dr. Wynona Neat letting him know that she needs a refill of her Temazepam.  Dr. Wynona Neat, patient is requesting a refill of her Temazepam.  Please advise.  Thank you.

## 2023-02-05 NOTE — Telephone Encounter (Signed)
Patient needs refill of Temazeapam. She will be headed out of town soon and needs the refill.  Pharmacy [facility] on [address]

## 2023-02-06 MED ORDER — TEMAZEPAM 30 MG PO CAPS: 30.0000 mg | ORAL_CAPSULE | Freq: Every evening | ORAL | 3 refills | Status: DC | PRN

## 2023-02-06 NOTE — Telephone Encounter (Signed)
Patient requesting refill for Temazepam 30 mg  LOV 07/22/2022  Dr.Olalere can you please advise.  Thank you

## 2023-02-06 NOTE — Telephone Encounter (Signed)
PT needs meds by the weekend. She is asking Herbert Seta to call her back, See also her [facility] message. Thanks.

## 2023-02-09 ENCOUNTER — Ambulatory Visit: Payer: 59 | Admitting: Pulmonary Disease

## 2023-02-11 ENCOUNTER — Ambulatory Visit: Payer: Self-pay | Admitting: *Deleted

## 2023-02-11 NOTE — Patient Outreach (Signed)
  Care Coordination   Follow Up Visit Note   02/11/2023 Name: Natalie Edwards MRN: [id number] DOB: 28-May-1965  Natalie Edwards is a 57 y.o. year old female who sees [name], MD for primary care. I spoke with  Natalie Edwards by phone today.  What matters to the patients health and wellness today?  Traveling for the holiday week and heading to see grandbaby    Goals Addressed             This Visit's Progress    Reduce stress related to financial strains        Activities and task to complete in order to accomplish goals.   Continue for renter's insurance asap to reduce your monthly rent fee Continue to consider your options  Call [employer] hotline "211" and local churches, DSS and other organizations provided to you to seek assistance with back rent owed  Await further word from [facility] Disability   Follow up with housing resources discussed (sent you an email for resource/support to prevent your eviction) Call [facility] [phone] to schedule your counseling appointment/paperwork completed. Follow up on medical appointments: Hematology, GI, etc Call [facility] program discussed- # provided for eviction assistance Consider part-time work you may be able to do that is less physical           SDOH assessments and interventions completed:  Yes     Care Coordination Interventions:  Yes, provided  Interventions Today    Flowsheet Row Most Recent Value  General Interventions   General Interventions Discussed/Reviewed Community Resources  [Pt has not been able to secure renters insurance yet- considering her options now]  Mental Health Interventions   Mental Health Discussed/Reviewed Mental Health Discussed  [doing ok- traveling for thanksgiving]       Follow up plan: 03/04/23   Encounter Outcome:  Patient Visit Completed

## 2023-02-15 DIAGNOSIS — E278 Other specified disorders of adrenal gland: Secondary | ICD-10-CM | POA: Diagnosis not present

## 2023-02-15 DIAGNOSIS — R1032 Left lower quadrant pain: Secondary | ICD-10-CM | POA: Diagnosis not present

## 2023-02-15 DIAGNOSIS — K429 Umbilical hernia without obstruction or gangrene: Secondary | ICD-10-CM | POA: Diagnosis not present

## 2023-02-15 DIAGNOSIS — I10 Essential (primary) hypertension: Secondary | ICD-10-CM | POA: Diagnosis not present

## 2023-02-15 DIAGNOSIS — K769 Liver disease, unspecified: Secondary | ICD-10-CM | POA: Diagnosis not present

## 2023-02-15 DIAGNOSIS — N281 Cyst of kidney, acquired: Secondary | ICD-10-CM | POA: Diagnosis not present

## 2023-02-15 DIAGNOSIS — K5730 Diverticulosis of large intestine without perforation or abscess without bleeding: Secondary | ICD-10-CM | POA: Diagnosis not present

## 2023-02-15 DIAGNOSIS — F17210 Nicotine dependence, cigarettes, uncomplicated: Secondary | ICD-10-CM | POA: Diagnosis not present

## 2023-02-24 ENCOUNTER — Ambulatory Visit: Payer: 59 | Admitting: Family Medicine

## 2023-02-27 ENCOUNTER — Other Ambulatory Visit: Payer: Self-pay | Admitting: Pulmonary Disease

## 2023-02-27 MED ORDER — BELSOMRA 20 MG PO TABS: 1.0000 | ORAL_TABLET | Freq: Every day | ORAL | 1 refills | Status: DC

## 2023-02-27 NOTE — Telephone Encounter (Signed)
[facility] After Clinic Hours Telephone Encounter  Patient contacted after hours line regarding Belsomra prescription. She was last seen in the office in May 2024. Currently scheduled for April 15, 2023 with Dr. Aldean Ast. Will provide 30 day supply with one refill to ensure she has enough prior to her next appointment.   Patient appreciated call and stated that she plans to be at next appointment.

## 2023-03-04 ENCOUNTER — Encounter (HOSPITAL_COMMUNITY): Payer: Self-pay

## 2023-03-04 ENCOUNTER — Other Ambulatory Visit: Payer: Self-pay

## 2023-03-04 ENCOUNTER — Telehealth: Payer: Self-pay | Admitting: *Deleted

## 2023-03-04 ENCOUNTER — Encounter: Payer: Self-pay | Admitting: *Deleted

## 2023-03-04 ENCOUNTER — Emergency Department (HOSPITAL_COMMUNITY): Payer: 59

## 2023-03-04 ENCOUNTER — Emergency Department (HOSPITAL_COMMUNITY)
Admission: EM | Admit: 2023-03-04 | Discharge: 2023-03-04 | Disposition: A | Discharge status: Home / Self Care | Payer: 59 | Attending: Emergency Medicine | Admitting: Emergency Medicine

## 2023-03-04 DIAGNOSIS — R1030 Lower abdominal pain, unspecified: Secondary | ICD-10-CM | POA: Diagnosis not present

## 2023-03-04 DIAGNOSIS — R1084 Generalized abdominal pain: Secondary | ICD-10-CM | POA: Diagnosis not present

## 2023-03-04 DIAGNOSIS — R1032 Left lower quadrant pain: Secondary | ICD-10-CM | POA: Diagnosis not present

## 2023-03-04 DIAGNOSIS — K760 Fatty (change of) liver, not elsewhere classified: Secondary | ICD-10-CM | POA: Diagnosis not present

## 2023-03-04 DIAGNOSIS — K259 Gastric ulcer, unspecified as acute or chronic, without hemorrhage or perforation: Secondary | ICD-10-CM | POA: Diagnosis not present

## 2023-03-04 DIAGNOSIS — R109 Unspecified abdominal pain: Secondary | ICD-10-CM | POA: Diagnosis not present

## 2023-03-04 DIAGNOSIS — Z9049 Acquired absence of other specified parts of digestive tract: Secondary | ICD-10-CM | POA: Diagnosis not present

## 2023-03-04 LAB — CBC WITH DIFFERENTIAL/PLATELET
Abs Immature Granulocytes: 0.06 10*3/uL (ref 0.00–0.07)
Basophils Absolute: 0 10*3/uL (ref 0.0–0.1)
Basophils Relative: 0 %
Eosinophils Absolute: 0 10*3/uL (ref 0.0–0.5)
Eosinophils Relative: 0 %
HCT: 40.1 % (ref 36.0–46.0)
Hemoglobin: 13.2 g/dL (ref 12.0–15.0)
Immature Granulocytes: 1 %
Lymphocytes Relative: 24 %
Lymphs Abs: 3.1 10*3/uL (ref 0.7–4.0)
MCH: 27.3 pg (ref 26.0–34.0)
MCHC: 32.9 g/dL (ref 30.0–36.0)
MCV: 83 fL (ref 80.0–100.0)
Monocytes Absolute: 1 10*3/uL (ref 0.1–1.0)
Monocytes Relative: 8 %
Neutro Abs: 8.6 10*3/uL — ABNORMAL HIGH (ref 1.7–7.7)
Neutrophils Relative %: 67 %
Platelets: 641 10*3/uL — ABNORMAL HIGH (ref 150–400)
RBC: 4.83 MIL/uL (ref 3.87–5.11)
RDW: 19.1 % — ABNORMAL HIGH (ref 11.5–15.5)
WBC: 12.8 10*3/uL — ABNORMAL HIGH (ref 4.0–10.5)
nRBC: 0 % (ref 0.0–0.2)

## 2023-03-04 LAB — COMPREHENSIVE METABOLIC PANEL
ALT: 22 U/L (ref 0–44)
AST: 17 U/L (ref 15–41)
Albumin: 4.3 g/dL (ref 3.5–5.0)
Alkaline Phosphatase: 66 U/L (ref 38–126)
Anion gap: 12 (ref 5–15)
BUN: 19 mg/dL (ref 6–20)
CO2: 17 mmol/L — ABNORMAL LOW (ref 22–32)
Calcium: 9.2 mg/dL (ref 8.9–10.3)
Chloride: 109 mmol/L (ref 98–111)
Creatinine, Ser: 1.5 mg/dL — ABNORMAL HIGH (ref 0.44–1.00)
GFR, Estimated: 40 mL/min — ABNORMAL LOW (ref >60)
Glucose, Bld: 81 mg/dL (ref 70–99)
Potassium: 3.8 mmol/L (ref 3.5–5.1)
Sodium: 138 mmol/L (ref 135–145)
Total Bilirubin: 1 mg/dL (ref <1.2)
Total Protein: 8.2 g/dL — ABNORMAL HIGH (ref 6.5–8.1)

## 2023-03-04 LAB — LIPASE, BLOOD: Lipase: 21 U/L (ref 11–51)

## 2023-03-04 MED ORDER — SODIUM CHLORIDE 0.9 % IV BOLUS
1000.0000 mL | Freq: Once | INTRAVENOUS | Status: AC
  Administered 2023-03-04: 1000 mL via INTRAVENOUS

## 2023-03-04 MED ORDER — SODIUM CHLORIDE (PF) 0.9 % IJ SOLN
INTRAMUSCULAR | Status: AC
  Filled 2023-03-04: qty 50

## 2023-03-04 MED ORDER — ONDANSETRON 4 MG PO TBDP
ORAL_TABLET | ORAL | 0 refills | Status: DC
Start: 2023-03-04 — End: 2023-05-23

## 2023-03-04 MED ORDER — HYDROMORPHONE HCL 1 MG/ML IJ SOLN
1.0000 mg | Freq: Once | INTRAMUSCULAR | Status: AC
Start: 2023-03-04 — End: 2023-03-04
  Administered 2023-03-04: 1 mg via INTRAVENOUS
  Filled 2023-03-04: qty 1

## 2023-03-04 MED ORDER — HYDROMORPHONE HCL 4 MG PO TABS: 4.0000 mg | ORAL_TABLET | Freq: Four times a day (QID) | ORAL | 0 refills | Status: DC | PRN

## 2023-03-04 MED ORDER — IOHEXOL 300 MG/ML  SOLN
80.0000 mL | Freq: Once | INTRAMUSCULAR | Status: AC | PRN
  Administered 2023-03-04: 80 mL via INTRAVENOUS

## 2023-03-04 MED ORDER — PANTOPRAZOLE SODIUM 40 MG IV SOLR
40.0000 mg | Freq: Once | INTRAVENOUS | Status: AC
  Administered 2023-03-04: 40 mg via INTRAVENOUS
  Filled 2023-03-04: qty 10

## 2023-03-04 MED ORDER — HYDROMORPHONE HCL 1 MG/ML IJ SOLN
0.5000 mg | Freq: Once | INTRAMUSCULAR | Status: AC
Start: 2023-03-04 — End: 2023-03-04
  Administered 2023-03-04: 0.5 mg via INTRAVENOUS
  Filled 2023-03-04: qty 1

## 2023-03-04 MED ORDER — ONDANSETRON HCL 4 MG/2ML IJ SOLN
4.0000 mg | Freq: Once | INTRAMUSCULAR | Status: AC
  Administered 2023-03-04: 4 mg via INTRAVENOUS
  Filled 2023-03-04: qty 2

## 2023-03-04 NOTE — Discharge Instructions (Addendum)
Follow-up with your family doctor as planned next week.  And follow back up with your stomach doctor.  Increase your Protonix so you are taking it twice a day

## 2023-03-04 NOTE — ED Provider Notes (Addendum)
[place] EMERGENCY DEPARTMENT AT [facility] Provider Note   CSN: [id number] Arrival date & time: 03/04/23  1191     History  Chief Complaint  Patient presents with   Abdominal Pain    Natalie Edwards is a 57 y.o. female.  Patient has a history of chronic abdominal pain.  She has been evaluated in the hospital by GI and will be seen as an outpatient.  The history is provided by the patient and medical records. No language interpreter was used.  Abdominal Pain Pain location:  Generalized Pain quality: aching   Pain radiates to:  Does not radiate Pain severity:  Moderate Onset quality:  Sudden Timing:  Constant Progression:  Worsening Chronicity:  Recurrent Context: not alcohol use   Relieved by:  Nothing Worsened by:  Nothing Associated symptoms: no chest pain, no cough, no diarrhea, no fatigue and no hematuria        Home Medications Prior to Admission medications   Medication Sig Start Date End Date Taking? Authorizing Provider  HYDROmorphone (DILAUDID) 4 MG tablet Take 1 tablet (4 mg total) by mouth every 6 (six) hours as needed for severe pain (pain score 7-10). 03/04/23  Yes Bethann Berkshire, MD  ondansetron (ZOFRAN-ODT) 4 MG disintegrating tablet 4mg  ODT q4 hours prn nausea/vomit 03/04/23  Yes Bethann Berkshire, MD  acetaminophen (TYLENOL) 500 MG tablet Take 500 mg by mouth every 6 (six) hours as needed for moderate pain (pain score 4-6).    [provider]  amLODipine (NORVASC) 5 MG tablet Take 1 tablet (5 mg total) by mouth daily. 12/28/22   Herrick, Richard Edward, DO  BELSOMRA 20 MG TABS Take 1 tablet (20 mg total) by mouth at bedtime. 02/27/23   Luciano Cutter, MD  dicyclomine (BENTYL) 20 MG tablet Take 1 tablet (20 mg total) by mouth 2 (two) times daily for 14 days. 01/16/23 01/30/23  Maxwell Marion, PA-C  diphenhydramine-acetaminophen (TYLENOL PM) 25-500 MG TABS tablet Take 1 tablet by mouth at bedtime as needed (pain).    [provider]  FLUoxetine (PROZAC) 20 MG capsule Take 1 capsule (20 mg total) by mouth daily. 12/28/22   Sarina Ill, DO  hydrOXYzine (ATARAX) 50 MG tablet Take 50 mg by mouth 2 (two) times daily as needed for anxiety (sleep).    [provider]  metoCLOPramide (REGLAN) 5 MG tablet Take 1 tablet (5 mg total) by mouth every 8 (eight) hours as needed for nausea. 01/22/23 01/22/24  Leatha Gilding, MD  mirtazapine (REMERON) 15 MG tablet Take 1 tablet (15 mg total) by mouth at bedtime. 12/27/22   Sarina Ill, DO  pantoprazole (PROTONIX) 40 MG tablet Take 1 tablet (40 mg total) by mouth daily. Patient taking differently: Take 40 mg by mouth daily as needed (acid reflux). 12/28/22   Sarina Ill, DO  polyethylene glycol (MIRALAX / GLYCOLAX) 17 g packet Take 17 g by mouth daily. 01/22/23   Leatha Gilding, MD  pregabalin (LYRICA) 75 MG capsule Take 1 capsule (75 mg total) by mouth 2 (two) times daily. 12/27/22   Sarina Ill, DO  prochlorperazine (COMPAZINE) 10 MG tablet Take 1 tablet (10 mg total) by mouth every 6 (six) hours as needed for nausea or vomiting. 01/22/23   Leatha Gilding, MD  risperiDONE (RISPERDAL) 0.5 MG tablet Take 1 tablet (0.5 mg total) by mouth 2 (two) times daily at 8 am and 4 pm. 12/27/22   Sarina Ill, DO  temazepam (RESTORIL)  30 MG capsule TAKE 1 CAPSULE BY MOUTH AT BEDTIME AS NEEDED 02/07/23   Olalere, Adewale A, MD  temazepam (RESTORIL) 30 MG capsule Take 1 capsule (30 mg total) by mouth at bedtime as needed. 02/06/23   Luciano Cutter, MD  traZODone (DESYREL) 100 MG tablet Take 1 tablet (100 mg total) by mouth at bedtime as needed for sleep. 12/27/22   Sarina Ill, DO      Allergies    Hydroxyzine and Ambien [zolpidem tartrate]    Review of Systems   Review of Systems  Constitutional:  Negative for appetite change and fatigue.  HENT:  Negative for congestion, ear discharge and sinus  pressure.   Eyes:  Negative for discharge.  Respiratory:  Negative for cough.   Cardiovascular:  Negative for chest pain.  Gastrointestinal:  Positive for abdominal pain. Negative for diarrhea.  Genitourinary:  Negative for frequency and hematuria.  Musculoskeletal:  Negative for back pain.  Skin:  Negative for rash.  Neurological:  Negative for seizures and headaches.  Psychiatric/Behavioral:  Negative for hallucinations.     Physical Exam Updated Vital Signs BP 134/69   Pulse 99   Temp (!) 97.1 F (36.2 C) (Oral)   Resp 18   Ht 5\' 4"  (1.626 m)   Wt 77.1 kg   SpO2 94%   BMI 29.18 kg/m  Physical Exam Vitals and nursing note reviewed.  Constitutional:      Appearance: She is well-developed.  HENT:     Head: Normocephalic.     Nose: Nose normal.  Eyes:     General: No scleral icterus.    Conjunctiva/sclera: Conjunctivae normal.  Neck:     Thyroid: No thyromegaly.  Cardiovascular:     Rate and Rhythm: Normal rate and regular rhythm.     Heart sounds: No murmur heard.    No friction rub. No gallop.  Pulmonary:     Breath sounds: No stridor. No wheezing or rales.  Chest:     Chest wall: No tenderness.  Abdominal:     General: There is no distension.     Tenderness: There is abdominal tenderness. There is no rebound.  Musculoskeletal:        General: Normal range of motion.     Cervical back: Neck supple.  Lymphadenopathy:     Cervical: No cervical adenopathy.  Skin:    Findings: No erythema or rash.  Neurological:     Mental Status: She is alert and oriented to person, place, and time.     Motor: No abnormal muscle tone.     Coordination: Coordination normal.  Psychiatric:        Behavior: Behavior normal.     ED Results / Procedures / Treatments   Labs (all labs ordered are listed, but only abnormal results are displayed) Labs Reviewed  CBC WITH DIFFERENTIAL/PLATELET - Abnormal; Notable for the following components:      Result Value   WBC 12.8 (*)     RDW 19.1 (*)    Platelets 641 (*)    Neutro Abs 8.6 (*)    All other components within normal limits  COMPREHENSIVE METABOLIC PANEL - Abnormal; Notable for the following components:   CO2 17 (*)    Creatinine, Ser 1.50 (*)    Total Protein 8.2 (*)    GFR, Estimated 40 (*)    All other components within normal limits  LIPASE, BLOOD    EKG None  Radiology CT ABDOMEN PELVIS W CONTRAST Result Date: 03/04/2023  CLINICAL DATA:  Abdominal pain, acute, nonlocalized. EXAM: CT ABDOMEN AND PELVIS WITH CONTRAST TECHNIQUE: Multidetector CT imaging of the abdomen and pelvis was performed using the standard protocol following bolus administration of intravenous contrast. RADIATION DOSE REDUCTION: This exam was performed according to the departmental dose-optimization program which includes automated exposure control, adjustment of the mA and/or kV according to patient size and/or use of iterative reconstruction technique. CONTRAST:  80mL OMNIPAQUE IOHEXOL 300 MG/ML  SOLN COMPARISON:  CT scan abdomen and pelvis from 01/18/2023. FINDINGS: Lower chest: There are dependent changes in the visualized lung bases. No overt consolidation. No pleural effusion. The heart is normal in size. No pericardial effusion. Hepatobiliary: The liver is normal in size. Non-cirrhotic configuration. No suspicious mass. These is mild diffuse hepatic steatosis. No intrahepatic bile duct dilation. There is mild prominence of the extrahepatic bile duct, most likely due to post cholecystectomy status. Gallbladder is surgically absent. Pancreas: Unremarkable. No pancreatic ductal dilatation or surrounding inflammatory changes. Spleen: Within normal limits. No focal lesion. Adrenals/Urinary Tract: Unremarkable right adrenal gland. There is a stable 1.4 x 2.0 cm left adrenal adenoma. No suspicious renal mass. There are multiple bilateral simple renal cysts with largest in the left kidney measuring up to 3.6 x 4.0 cm. No nephroureterolithiasis or  obstructive uropathy. Unremarkable urinary bladder. Stomach/Bowel: No disproportionate dilation of the small or large bowel loops. No evidence of abnormal bowel wall thickening or inflammatory changes. The appendix is unremarkable. There are multiple diverticula mainly in the left hemi colon, without imaging signs of diverticulitis. Vascular/Lymphatic: No ascites or pneumoperitoneum. No abdominal or pelvic lymphadenopathy, by size criteria. No aneurysmal dilation of the major abdominal arteries. There are mild peripheral atherosclerotic vascular calcifications of the aorta and its major branches. Reproductive: The uterus is surgically absent. No large adnexal mass. Other: There is a tiny fat containing umbilical hernia. The soft tissues and abdominal wall are otherwise unremarkable. Musculoskeletal: No suspicious osseous lesions. There are mild multilevel degenerative changes in the visualized spine. IMPRESSION: *No acute inflammatory process identified within the abdomen or pelvis. *Multiple other nonacute observations, as described above. Electronically Signed   By: Jules Schick M.D.   On: 03/04/2023 11:36    Procedures Procedures    Medications Ordered in ED Medications  sodium chloride 0.9 % bolus 1,000 mL (1,000 mLs Intravenous New Bag/Given 03/04/23 1132)  pantoprazole (PROTONIX) injection 40 mg (40 mg Intravenous Given 03/04/23 1012)  sodium chloride 0.9 % bolus 1,000 mL (0 mLs Intravenous Stopped 03/04/23 1129)  ondansetron (ZOFRAN) injection 4 mg (4 mg Intravenous Given 03/04/23 1005)  HYDROmorphone (DILAUDID) injection 0.5 mg (0.5 mg Intravenous Given 03/04/23 1005)  iohexol (OMNIPAQUE) 300 MG/ML solution 80 mL (80 mLs Intravenous Contrast Given 03/04/23 1018)  HYDROmorphone (DILAUDID) injection 1 mg (1 mg Intravenous Given 03/04/23 1145)    ED Course/ Medical Decision Making/ A&P                                 Medical Decision Making Amount and/or Complexity of Data  Reviewed Labs: ordered. Radiology: ordered.  Risk Prescription drug management.  This patient presents to the ED for concern of abdominal pain, this involves an extensive number of treatment options, and is a complaint that carries with it a high risk of complications and morbidity.  The differential diagnosis includes appendicitis, irritable bowel   Co morbidities that complicate the patient evaluation  Chronic abdominal pain   Additional history obtained:  Additional history obtained from patient External records from outside source obtained and reviewed including hospital records   Lab Tests:  I Ordered, and personally interpreted labs.  The pertinent results include: White count 12.8   Imaging Studies ordered:  I ordered imaging studies including CT abdomen I independently visualized and interpreted imaging which showed no acute disease I agree with the radiologist interpretation   Cardiac Monitoring: / EKG:  The patient was maintained on a cardiac monitor.  I personally viewed and interpreted the cardiac monitored which showed an underlying rhythm of: Normal sinus rhythm   Consultations Obtained:  No consultant  Problem List / ED Course / Critical interventions / Medication management  Abdominal pain I ordered medication including Dilaudid and Zofran Reevaluation of the patient after these medicines showed that the patient improved I have reviewed the patients home medicines and have made adjustments as needed   Social Determinants of Health:  None   Test / Admission - Considered:  None  Exacerbation of chronic abdominal pain.  Patient will increase her Protonix and follow-up with GI and her family doctor.  She is also given some pain medicine        Final Clinical Impression(s) / ED Diagnoses Final diagnoses:  Lower abdominal pain    Rx / DC Orders ED Discharge Orders          Ordered    HYDROmorphone (DILAUDID) 4 MG tablet  Every 6  hours PRN        03/04/23 1237    ondansetron (ZOFRAN-ODT) 4 MG disintegrating tablet        03/04/23 1241              Bethann Berkshire, MD 03/04/23 1718    Bethann Berkshire, MD 03/04/23 1720

## 2023-03-04 NOTE — Patient Outreach (Signed)
  Care Coordination   03/04/2023 Name: Natalie Edwards MRN: [id number] DOB: 1966/03/02   Care Coordination Outreach Attempts:  An unsuccessful telephone outreach was attempted today to offer the patient information about available complex care management services.  Follow Up Plan:  Additional outreach attempts will be made to offer the patient complex care management information and services.   Encounter Outcome:  No Answer   Care Coordination Interventions:  No, not indicated    Reece Levy, MSW, LCSW [place], [facility] Licensed Clinical Social Worker Care Coordinator  [phone]

## 2023-03-04 NOTE — ED Triage Notes (Signed)
Pt has had abdominal pain for 3 days. Says she has been vomiting blood and that she has an ulcer. No appetite. Denies diarrhea. Denies N/V today.  LLQ pain. 152/80 BP 120 HR 97% RA CBG 112.

## 2023-03-05 ENCOUNTER — Telehealth: Payer: Self-pay | Admitting: *Deleted

## 2023-03-05 NOTE — Patient Outreach (Signed)
  Care Coordination   03/05/2023 Name: Natalie Edwards MRN: [id number] DOB: 06/13/1965   Care Coordination Outreach Attempts:  A second unsuccessful outreach was attempted today to offer the patient with information about available complex care management services.  Follow Up Plan:  Additional outreach attempts will be made to offer the patient complex care management information and services.   Encounter Outcome:  No Answer   Care Coordination Interventions:  No, not indicated    Reece Levy, MSW, LCSW [place], [facility] Licensed Clinical Social Worker Care Coordinator  [phone]

## 2023-03-06 ENCOUNTER — Ambulatory Visit: Payer: Self-pay | Admitting: *Deleted

## 2023-03-06 ENCOUNTER — Ambulatory Visit: Payer: Self-pay

## 2023-03-06 NOTE — Patient Outreach (Signed)
  Care Coordination   Follow Up Visit Note   03/06/2023 Name: Natalie Edwards MRN: [id number] DOB: 02/26/1966  Natalie Edwards is a 57 y.o. year old female who sees Natalie Skeans, MD for primary care. I spoke with  Natalie Edwards by phone today.  What matters to the patients health and wellness today?  How can I get my medication replaced that was missing when I got home from drug store?  . CSW suggested she call Natalie Edwards and ask them to check camera to see if it was placed in her bag- as well, she can call PCP to ask about refilling but advised her not certain they can or will without seeing her.    Goals Addressed             This Visit's Progress    COMPLETED: Reduce stress related to financial strains        Activities and task to complete in order to accomplish goals.   Continue for renter's insurance asap to reduce your monthly rent fee Continue to consider your options  Call [employer] hotline "211" and local churches, DSS and other organizations provided to you to seek assistance with back rent owed  Await further word from [facility] Disability   Follow up with housing resources discussed (sent you an email for resource/support to prevent your eviction) Call [facility] [phone] to schedule your counseling appointment/paperwork completed. Follow up on medical appointments: Hematology, GI, etc Call [facility] program discussed- # provided for eviction assistance Consider part-time work you may be able to do that is less physical           SDOH assessments and interventions completed:  Yes     Care Coordination Interventions:  Yes, provided  Interventions Today    Flowsheet Row Most Recent Value  Chronic Disease   Chronic disease during today's visit Other  [recent ER visit for stomach issues]  Mental Health Interventions   Mental Health Discussed/Reviewed Mental Health Discussed  Pharmacy Interventions   Pharmacy Dicussed/Reviewed Pharmacy Topics Discussed, Pharmacy Topics Reviewed    [Pt called to report she was prescribed pain medication when in ER and when she got home it was not in her bag. Pt reports she went to [employer] and then took an Pharmacist, community home]  Safety Interventions   Safety Discussed/Reviewed Safety Discussed       Follow up plan: No further intervention required. Advised pt that this CSW will not be working with her PCP as of new year. Pt will request referral for SW support if needs arise.  Encounter Outcome:  Patient Visit Completed

## 2023-03-06 NOTE — Telephone Encounter (Signed)
  Chief Complaint: abdominal pain Symptoms: LLQ abdominal pain, 10/10, constant Frequency: Monday  Pertinent Negatives: Patient denies vomiting or diarrhea  Disposition: [x] ED /[] Urgent Care (no appt availability in office) / [] Appointment(In office/virtual)/ []  [facility]/ [] Home Care/ [] Refused Recommended Disposition /[] [facility]/ []  Follow-up with PCP Additional Notes: pt states she went to ED on 03/04/23, was diagnosed with ulcers and given hydromorphone and zofran. Pharmacy received and filled, pt picked up but she states the hydromorphone was not in the bag. She called the pharmacy but no one has contacted her back. I recommended she go back to ED d/t pain and see if they can help with the rx or re-evaluate sx. Pt didn't want to go back and be seen again she rather call them, transferred to pt [facility] ED.   Reason for Disposition  [1] SEVERE pain (e.g., excruciating) AND [2] present > 1 hour  Answer Assessment - Initial Assessment Questions 1. LOCATION: "Where does it hurt?"      Lower left abdomen  2. RADIATION: "Does the pain shoot anywhere else?" (e.g., chest, back)     no 3. ONSET: "When did the pain begin?" (e.g., minutes, hours or days ago)      Monday  5. PATTERN "Does the pain come and go, or is it constant?"    - If it comes and goes: "How long does it last?" "Do you have pain now?"     (Note: Comes and goes means the pain is intermittent. It goes away completely between bouts.)    - If constant: "Is it getting better, staying the same, or getting worse?"      (Note: Constant means the pain never goes away completely; most serious pain is constant and gets worse.)      Constant  6. SEVERITY: "How bad is the pain?"  (e.g., Scale 1-10; mild, moderate, or severe)    - MILD (1-3): Doesn't interfere with normal activities, abdomen soft and not tender to touch.     - MODERATE (4-7): Interferes with normal activities or awakens from sleep, abdomen tender to  touch.     - SEVERE (8-10): Excruciating pain, doubled over, unable to do any normal activities.       10/10 8. CAUSE: "What do you think is causing the stomach pain?"     Ulcers  10. OTHER SYMPTOMS: "Do you have any other symptoms?" (e.g., back pain, diarrhea, fever, urination pain, vomiting)       HA  Protocols used: Abdominal Pain - Female-A-AH

## 2023-03-10 ENCOUNTER — Ambulatory Visit (INDEPENDENT_AMBULATORY_CARE_PROVIDER_SITE_OTHER): Payer: 59 | Admitting: Family Medicine

## 2023-03-10 ENCOUNTER — Encounter: Payer: Self-pay | Admitting: Family Medicine

## 2023-03-10 VITALS — BP 123/81 | HR 85 | Temp 98.3°F | Resp 18 | Ht 64.0 in | Wt 179.2 lb

## 2023-03-10 DIAGNOSIS — R109 Unspecified abdominal pain: Secondary | ICD-10-CM | POA: Diagnosis not present

## 2023-03-10 DIAGNOSIS — F419 Anxiety disorder, unspecified: Secondary | ICD-10-CM

## 2023-03-10 DIAGNOSIS — J209 Acute bronchitis, unspecified: Secondary | ICD-10-CM

## 2023-03-10 DIAGNOSIS — F32A Depression, unspecified: Secondary | ICD-10-CM

## 2023-03-10 DIAGNOSIS — G8929 Other chronic pain: Secondary | ICD-10-CM | POA: Diagnosis not present

## 2023-03-10 MED ORDER — DICYCLOMINE HCL 20 MG PO TABS: 20.0000 mg | ORAL_TABLET | Freq: Three times a day (TID) | ORAL | 0 refills | Status: DC

## 2023-03-10 MED ORDER — AZITHROMYCIN 250 MG PO TABS: ORAL_TABLET | ORAL | 0 refills | Status: AC

## 2023-03-10 MED ORDER — METOCLOPRAMIDE HCL 5 MG PO TABS: 5.0000 mg | ORAL_TABLET | Freq: Three times a day (TID) | ORAL | 1 refills | Status: DC | PRN

## 2023-03-12 ENCOUNTER — Encounter: Payer: Self-pay | Admitting: Family Medicine

## 2023-03-12 NOTE — Progress Notes (Signed)
Established Patient Office Visit  Subjective    Patient ID: Natalie Edwards, female    DOB: 08-26-1965  Age: 57 y.o. MRN: [id number]  CC:  Chief Complaint  Patient presents with   Follow-up    HPI Natalie Edwards presents for follow up of chronic abdominal pain as well as follow up of anxiety/depression. Patient reports that she was denies eave for her anxiety/depression and then was terminated. Patient also reports that she has some bronchitis sx for the past 2 weeks that is not improving. She has not had abx for sx.   Outpatient Encounter Medications as of 03/10/2023  Medication Sig   acetaminophen (TYLENOL) 500 MG tablet Take 500 mg by mouth every 6 (six) hours as needed for moderate pain (pain score 4-6).   amLODipine (NORVASC) 5 MG tablet Take 1 tablet (5 mg total) by mouth daily.   azithromycin (ZITHROMAX) 250 MG tablet Take 2 tablets on day 1, then 1 tablet daily on days 2 through 5   BELSOMRA 20 MG TABS Take 1 tablet (20 mg total) by mouth at bedtime.   FLUoxetine (PROZAC) 20 MG capsule Take 1 capsule (20 mg total) by mouth daily.   HYDROmorphone (DILAUDID) 4 MG tablet Take 1 tablet (4 mg total) by mouth every 6 (six) hours as needed for severe pain (pain score 7-10).   hydrOXYzine (ATARAX) 50 MG tablet Take 50 mg by mouth 2 (two) times daily as needed for anxiety (sleep).   mirtazapine (REMERON) 15 MG tablet Take 1 tablet (15 mg total) by mouth at bedtime.   ondansetron (ZOFRAN-ODT) 4 MG disintegrating tablet 4mg  ODT q4 hours prn nausea/vomit   pantoprazole (PROTONIX) 40 MG tablet Take 1 tablet (40 mg total) by mouth daily. (Patient taking differently: Take 40 mg by mouth daily as needed (acid reflux).)   polyethylene glycol (MIRALAX / GLYCOLAX) 17 g packet Take 17 g by mouth daily.   pregabalin (LYRICA) 75 MG capsule Take 1 capsule (75 mg total) by mouth 2 (two) times daily.   prochlorperazine (COMPAZINE) 10 MG tablet Take 1 tablet (10 mg total) by mouth every 6 (six) hours as  needed for nausea or vomiting.   risperiDONE (RISPERDAL) 0.5 MG tablet Take 1 tablet (0.5 mg total) by mouth 2 (two) times daily at 8 am and 4 pm.   temazepam (RESTORIL) 30 MG capsule TAKE 1 CAPSULE BY MOUTH AT BEDTIME AS NEEDED   temazepam (RESTORIL) 30 MG capsule Take 1 capsule (30 mg total) by mouth at bedtime as needed.   traZODone (DESYREL) 100 MG tablet Take 1 tablet (100 mg total) by mouth at bedtime as needed for sleep.   [DISCONTINUED] metoCLOPramide (REGLAN) 5 MG tablet Take 1 tablet (5 mg total) by mouth every 8 (eight) hours as needed for nausea.   dicyclomine (BENTYL) 20 MG tablet Take 1 tablet (20 mg total) by mouth 3 (three) times daily before meals.   metoCLOPramide (REGLAN) 5 MG tablet Take 1 tablet (5 mg total) by mouth every 8 (eight) hours as needed for nausea.   [DISCONTINUED] dicyclomine (BENTYL) 20 MG tablet Take 1 tablet (20 mg total) by mouth 2 (two) times daily for 14 days.   [DISCONTINUED] diphenhydramine-acetaminophen (TYLENOL PM) 25-500 MG TABS tablet Take 1 tablet by mouth at bedtime as needed (pain).   No facility-administered encounter medications on file as of 03/10/2023.    Past Medical History:  Diagnosis Date   Allergy    Anemia    Diverticulosis    Gall bladder  stones 1993   Gastric ulcer    GERD (gastroesophageal reflux disease)    Hypertension    Renal mass     Past Surgical History:  Procedure Laterality Date   ABDOMINAL HYSTERECTOMY     BIOPSY  09/29/2022   Procedure: BIOPSY;  Surgeon: Napoleon Form, MD;  Location: [facility];  Service: Gastroenterology;;   CHOLECYSTECTOMY     ESOPHAGOGASTRODUODENOSCOPY (EGD) WITH PROPOFOL N/A 09/29/2022   Procedure: ESOPHAGOGASTRODUODENOSCOPY (EGD) WITH PROPOFOL;  Surgeon: Napoleon Form, MD;  Location: [facility];  Service: Gastroenterology;  Laterality: N/A;    Family History  Problem Relation Age of Onset   Cancer Maternal Uncle        Lung   Cancer Maternal Uncle        Lung   Cancer  Maternal Uncle        Lung   Headache Neg Hx        "I don't think so"   Migraines Neg Hx        "I don't think so"    Social History   Socioeconomic History   Marital status: Single    Spouse name: Not on file   Number of children: 3   Years of education: Not on file   Highest education level: 12th grade  Occupational History   Occupation: environmental services at [employer]  Tobacco Use   Smoking status: Every Day    Current packs/day: 0.50    Average packs/day: 0.5 packs/day for 25.0 years (12.5 ttl pk-yrs)    Types: Cigarettes   Smokeless tobacco: Never   Tobacco comments:    Pt tried to quit - helps with anxiety     1 pack last patient 3 days   Vaping Use   Vaping status: Never Used  Substance and Sexual Activity   Alcohol use: Yes    Comment: occasionally - last drink was July 4, 1 shot   Drug use: No   Sexual activity: Yes    Comment: hysterectomy  Other Topics Concern   Not on file  Social History Narrative   Lives at home in an apartment. Her mother lives with her.    Right handed   Caffeine: drinks approx. 36 oz of pepsi per day. Sometimes drinks 2 cups of coffee in a day as well.    Social Drivers of Corporate investment banker Strain: Low Risk  (03/06/2023)   Overall Financial Resource Strain (CARDIA)    Difficulty of Paying Living Expenses: Not very hard  Food Insecurity: Food Insecurity Present (03/06/2023)   Hunger Vital Sign    Worried About Running Out of Food in the Last Year: Sometimes true    Ran Out of Food in the Last Year: Often true  Transportation Needs: Unmet Transportation Needs (03/06/2023)   PRAPARE - Administrator, Civil Service (Medical): Yes    Lack of Transportation (Non-Medical): Yes  Physical Activity: Sufficiently Active (05/22/2022)   Exercise Vital Sign    Days of Exercise per Week: 4 days    Minutes of Exercise per Session: 70 min  Stress: Stress Concern Present (03/06/2023)   [employer] of  Occupational Health - Occupational Stress Questionnaire    Feeling of Stress : Very much  Social Connections: Socially Isolated (03/06/2023)   Social Connection and Isolation Panel [NHANES]    Frequency of Communication with Friends and Family: More than three times a week    Frequency of Social Gatherings with Friends and Family: Never  Attends Religious Services: Never    Active Member of Clubs or Organizations: No    Attends Banker Meetings: Never    Marital Status: Separated  Intimate Partner Violence: Not At Risk (01/18/2023)   Humiliation, Afraid, Rape, and Kick questionnaire    Fear of Current or Ex-Partner: No    Emotionally Abused: No    Physically Abused: No    Sexually Abused: No    Review of Systems  Gastrointestinal:  Positive for abdominal pain.  All other systems reviewed and are negative.       Objective    BP 123/81 (BP Location: Right Arm, Cuff Size: Normal)   Pulse 85   Temp 98.3 F (36.8 C) (Oral)   Resp 18   Ht 5\' 4"  (1.626 m)   Wt 179 lb 3.2 oz (81.3 kg)   SpO2 93%   BMI 30.76 kg/m   Physical Exam Vitals and nursing note reviewed.  Constitutional:      General: She is not in acute distress. Cardiovascular:     Rate and Rhythm: Normal rate and regular rhythm.  Pulmonary:     Effort: Pulmonary effort is normal.     Breath sounds: Normal breath sounds.  Abdominal:     Palpations: Abdomen is soft.     Tenderness: There is no abdominal tenderness.  Neurological:     General: No focal deficit present.     Mental Status: She is alert and oriented to person, place, and time.  Psychiatric:        Mood and Affect: Mood normal.        Behavior: Behavior normal.         Assessment & Plan:   1. Chronic abdominal pain (Primary) Reglan and bentyl prescribed. Patient to follow up with GI.   2. Acute bronchitis, unspecified organism Zithromax prescribed  3. Anxiety and depression Appears stable.     No follow-ups on file.    Tommie Raymond, MD

## 2023-03-15 ENCOUNTER — Emergency Department (HOSPITAL_COMMUNITY)
Admission: EM | Admit: 2023-03-15 | Discharge: 2023-03-15 | Discharge status: Against Medical Advice | Payer: 59 | Attending: Medical | Admitting: Medical

## 2023-03-15 ENCOUNTER — Encounter (HOSPITAL_COMMUNITY): Payer: Self-pay | Admitting: Emergency Medicine

## 2023-03-15 ENCOUNTER — Other Ambulatory Visit: Payer: Self-pay

## 2023-03-15 DIAGNOSIS — Z5321 Procedure and treatment not carried out due to patient leaving prior to being seen by health care provider: Secondary | ICD-10-CM | POA: Insufficient documentation

## 2023-03-15 DIAGNOSIS — R1084 Generalized abdominal pain: Secondary | ICD-10-CM | POA: Insufficient documentation

## 2023-03-15 DIAGNOSIS — R112 Nausea with vomiting, unspecified: Secondary | ICD-10-CM | POA: Insufficient documentation

## 2023-03-15 LAB — CBC WITH DIFFERENTIAL/PLATELET
Abs Immature Granulocytes: 0.1 10*3/uL — ABNORMAL HIGH (ref 0.00–0.07)
Basophils Absolute: 0 10*3/uL (ref 0.0–0.1)
Basophils Relative: 0 %
Eosinophils Absolute: 0.2 10*3/uL (ref 0.0–0.5)
Eosinophils Relative: 1 %
HCT: 36.5 % (ref 36.0–46.0)
Hemoglobin: 12.1 g/dL (ref 12.0–15.0)
Immature Granulocytes: 1 %
Lymphocytes Relative: 29 %
Lymphs Abs: 5.1 10*3/uL — ABNORMAL HIGH (ref 0.7–4.0)
MCH: 27.1 pg (ref 26.0–34.0)
MCHC: 33.2 g/dL (ref 30.0–36.0)
MCV: 81.8 fL (ref 80.0–100.0)
Monocytes Absolute: 1.3 10*3/uL — ABNORMAL HIGH (ref 0.1–1.0)
Monocytes Relative: 7 %
Neutro Abs: 10.7 10*3/uL — ABNORMAL HIGH (ref 1.7–7.7)
Neutrophils Relative %: 62 %
Platelets: 489 10*3/uL — ABNORMAL HIGH (ref 150–400)
RBC: 4.46 MIL/uL (ref 3.87–5.11)
RDW: 19.8 % — ABNORMAL HIGH (ref 11.5–15.5)
WBC: 17.3 10*3/uL — ABNORMAL HIGH (ref 4.0–10.5)
nRBC: 0 % (ref 0.0–0.2)

## 2023-03-15 LAB — URINALYSIS, ROUTINE W REFLEX MICROSCOPIC
Bacteria, UA: NONE SEEN
Bilirubin Urine: NEGATIVE
Glucose, UA: NEGATIVE mg/dL
Hgb urine dipstick: NEGATIVE
Ketones, ur: NEGATIVE mg/dL
Nitrite: NEGATIVE
Protein, ur: 100 mg/dL — AB
Specific Gravity, Urine: 1.02 (ref 1.005–1.030)
pH: 5 (ref 5.0–8.0)

## 2023-03-15 LAB — COMPREHENSIVE METABOLIC PANEL
ALT: 16 U/L (ref 0–44)
AST: 18 U/L (ref 15–41)
Albumin: 3.8 g/dL (ref 3.5–5.0)
Alkaline Phosphatase: 54 U/L (ref 38–126)
Anion gap: 10 (ref 5–15)
BUN: 10 mg/dL (ref 6–20)
CO2: 20 mmol/L — ABNORMAL LOW (ref 22–32)
Calcium: 9.4 mg/dL (ref 8.9–10.3)
Chloride: 108 mmol/L (ref 98–111)
Creatinine, Ser: 0.93 mg/dL (ref 0.44–1.00)
GFR, Estimated: >60 mL/min (ref >60)
Glucose, Bld: 97 mg/dL (ref 70–99)
Potassium: 3.4 mmol/L — ABNORMAL LOW (ref 3.5–5.1)
Sodium: 138 mmol/L (ref 135–145)
Total Bilirubin: 0.4 mg/dL (ref <1.2)
Total Protein: 7.4 g/dL (ref 6.5–8.1)

## 2023-03-15 LAB — TROPONIN I (HIGH SENSITIVITY): Troponin I (High Sensitivity): 3 ng/L (ref <18)

## 2023-03-15 LAB — LIPASE, BLOOD: Lipase: 25 U/L (ref 11–51)

## 2023-03-15 NOTE — ED Provider Triage Note (Cosign Needed)
Emergency Medicine Provider Triage Evaluation Note  Natalie Edwards , a 57 y.o. female  was evaluated in triage.  Pt complains of diffuse abdominal painxseveral feels. Feels very similar to pain in past. Intractable N/V.  Review of Systems  Positive: N/V/abdominal pain Negative: fevers  Physical Exam  BP (!) 163/94 (BP Location: Left Arm)   Pulse (!) 108   Temp (!) 97.5 F (36.4 C)   Resp (!) 24   SpO2 99%  Gen:   Awake, no distress   Resp:  Normal effort  MSK:   Moves extremities without difficulty  Other:  LUQ and LLQ ttp w/o guarding  Medical Decision Making  Medically screening exam initiated at 5:28 PM.  Appropriate orders placed.  Natalie Edwards was informed that the remainder of the evaluation will be completed by another provider, this initial triage assessment does not replace that evaluation, and the importance of remaining in the ED until their evaluation is complete.     Natalie Edwards, Natalie Edwards 03/15/23 1728

## 2023-03-15 NOTE — ED Notes (Signed)
Pt left without being seen.

## 2023-03-15 NOTE — ED Triage Notes (Signed)
Pt with left sided abdominal pain, chronic in nature.  Seen for same recently. States no change in symptoms .

## 2023-03-16 ENCOUNTER — Other Ambulatory Visit: Payer: Self-pay

## 2023-03-16 ENCOUNTER — Encounter: Payer: Self-pay | Admitting: Gastroenterology

## 2023-03-16 ENCOUNTER — Telehealth: Payer: Self-pay | Admitting: Gastroenterology

## 2023-03-16 ENCOUNTER — Ambulatory Visit (INDEPENDENT_AMBULATORY_CARE_PROVIDER_SITE_OTHER): Payer: 59 | Admitting: Gastroenterology

## 2023-03-16 VITALS — BP 134/94 | HR 105 | Ht 64.0 in | Wt 174.0 lb

## 2023-03-16 DIAGNOSIS — R1032 Left lower quadrant pain: Secondary | ICD-10-CM | POA: Diagnosis not present

## 2023-03-16 DIAGNOSIS — K5900 Constipation, unspecified: Secondary | ICD-10-CM

## 2023-03-16 DIAGNOSIS — Z8711 Personal history of peptic ulcer disease: Secondary | ICD-10-CM | POA: Diagnosis not present

## 2023-03-16 DIAGNOSIS — R112 Nausea with vomiting, unspecified: Secondary | ICD-10-CM

## 2023-03-16 DIAGNOSIS — K279 Peptic ulcer, site unspecified, unspecified as acute or chronic, without hemorrhage or perforation: Secondary | ICD-10-CM

## 2023-03-16 MED ORDER — PROMETHAZINE HCL 12.5 MG RE SUPP: 12.5000 mg | Freq: Three times a day (TID) | RECTAL | 0 refills | Status: DC | PRN

## 2023-03-16 MED ORDER — NA SULFATE-K SULFATE-MG SULF 17.5-3.13-1.6 GM/177ML PO SOLN: 1.0000 | Freq: Once | ORAL | 0 refills | Status: AC

## 2023-03-16 NOTE — Patient Instructions (Addendum)
We have sent the following medications to your pharmacy for you to pick up at your convenience: Phnergren   We have given you samples of the following medication to take: Ibgard    It has been recommended to you by your physician that you have a(n) endoscopy/colonoscopy  completed. Per your request, we did not schedule the procedure(s) today. Please contact our office at [phone] should you decide to have the procedure completed. You will be scheduled for a pre-visit and procedure at that time. Ask for Ines Bloomer    _______________________________________________________  If your blood pressure at your visit was 140/90 or greater, please contact your primary care physician to follow up on this.  _______________________________________________________  If you are age 102 or older, your body mass index should be between 23-30. Your There is no height or weight on file to calculate BMI. If this is out of the aforementioned range listed, please consider follow up with your Primary Care Provider.  If you are age 4 or younger, your body mass index should be between 19-25. Your There is no height or weight on file to calculate BMI. If this is out of the aformentioned range listed, please consider follow up with your Primary Care Provider.   ________________________________________________________  The [facility] providers would like to encourage you to use [facility] to communicate with providers for non-urgent requests or questions.  Due to long hold times on the telephone, sending your provider a message by [facility] may be a faster and more efficient way to get a response.  Please allow 48 business hours for a response.  Please remember that this is for non-urgent requests.  _______________________________________________________ It was a pleasure to see you today!  Thank you for trusting me with your gastrointestinal care!

## 2023-03-16 NOTE — Progress Notes (Signed)
Chief Complaint: Abdominal pain, nausea vomiting Primary GI MD: Dr. Chales Abrahams  HPI: 57 year old female history of PUD, GERD, CKD stage II, iron deficiency anemia, cholecystectomy, presents for follow-up on recent hospitalizations.  On 03/15/2023 patient was seen at the ED for complaints of abdominal pain with nausea and vomiting.  Per note patient left without being seen.  On 03/04/2023 patient presented to ED for abdominal pain.  Patient was given Dilaudid and Zofran improved her symptoms and she was discharged home.  Patient was instructed at that time to increase her Protonix and follow-up with GI.  On 01/18/2023 patient was admitted into hospital for abdominal pain and seen by Dr. Lavon Paganini on 11/6 for intractable nausea and vomiting.  CT abdomen pelvis with contrast on 11/ 3 with unrevealing.  Main symptom at that time was nausea associated with dizzy spells.  Patient had reported a weight loss of 15 pounds in the last few months.  Patient did complete gastric emptying study while admitted though did so while on IV Reglan.   Last seen 12/16/2022 in GI office by Midge Minium for follow-up on abdominal pain and nausea.  Patient complained of generalized abdominal discomfort not related to eating or BMs.  Patient endorsed constipation with straining.  Follow recommended patient try glycerin suppositories to help with evacuation of rectum.  She was directed to continue the Bentyl and Zofran.  A request was made for her colonoscopy report from [facility] medical.  Patient has had nausea, vomiting, generalized abdominal pain and hospitalized July 2024 with symptoms out of proportion to EGD findings.  She has also had 3 CT scans since then which were negative other than diverticulitis which is now resolved.  Call recommended pantoprazole 40 Mg daily, famotidine 20 Mg daily, Zofran, Bentyl  Interval History:  Today patient states she continues to have nausea, vomiting, and abdominal pain. Patient  states last week she went the entire week without having a bowel movement.  Over the weekend she began having severe LLQ pain with associated nausea and vomiting.  She went to emergency department for left without being seen.  She had a bowel movement today that was normal.  Feels the Reglan does not help her nausea and vomiting as she will have nausea and dry heaves throughout the day that is not associated with eating and can be random at times.  She notices this is worse when she has not had a bowel movement.  She is not currently on anything for constipation.  She did MiraLAX but felt that it caused diarrhea.   Wt Readings from Last 3 Encounters:  03/16/23 174 lb (78.9 kg)  03/10/23 179 lb 3.2 oz (81.3 kg)  03/04/23 170 lb (77.1 kg)     PREVIOUS GI WORKUP   03/04/2023 CT abdomen pelvis with contrast IMPRESSION: *No acute inflammatory process identified within the abdomen or pelvis. *Multiple other nonacute observations, as described above.  01/21/2023 nuclear medicine gastric emptying study FINDINGS: Expected location of the stomach in the left upper quadrant. Ingested meal empties the stomach gradually over the course of the study with 31% retention at 60 min and 5% retention at 120 min (normal retention less than 30% at a 120 min). IMPRESSION: Normal gastric emptying study.  01/18/2023 CT abdomen pelvis with contrast IMPRESSION: 1. No acute finding in the abdomen or pelvis. 2. Colonic diverticulosis without evidence of acute diverticulitis.  10/21/2022 CT abdomen pelvis with contrast IMPRESSION: No bowel obstruction, free air or free fluid. Normal appendix. Colonic diverticula. Previous inflammatory  changes along the distal descending colon are not clearly seen on the current examination. Previous cholecystectomy. Fatty liver infiltration.  10/20/2022 CT abdomen/pelvis with contrast IMPRESSION: 1. Improved appearance of diverticulitis at the junction of the descending  and sigmoid colon, nearly resolved. No extraluminal gas or abscess. 2. Aortic atherosclerosis.   09/29/22 EGD - Z-line regular, 38 cm from the incisors. - No gross lesions in the entire esophagus. - Non-bleeding gastric ulcer with no stigmata of bleeding. - A single mucosal papule (nodule) found in the stomach. Biopsied. - Gastritis. Biopsied. - Normal examined duodenum.   A. STOMACH, ANTRUM NODULE, BIOPSY:  - Polypoid gastric antral mucosa showing marked reactive gastropathy and  features of mucosal prolapse  - Negative for intestinal metaplasia, dysplasia or malignancy   B. STOMACH, RANDOM, BIOPSY:  - Gastric antral and oxyntic mucosa with nonspecific reactive  gastropathy  - Helicobacter pylori-like organisms are not identified on routine HE  stain   Reported colonoscopy in 2018 with [facility]   Past Medical History:  Diagnosis Date   Allergy    Anemia    Diverticulosis    Gall bladder stones 1993   Gastric ulcer    GERD (gastroesophageal reflux disease)    Hypertension    Renal mass     Past Surgical History:  Procedure Laterality Date   ABDOMINAL HYSTERECTOMY     BIOPSY  09/29/2022   Procedure: BIOPSY;  Surgeon: Napoleon Form, MD;  Location: [facility] ENDOSCOPY;  Service: Gastroenterology;;   CHOLECYSTECTOMY     ESOPHAGOGASTRODUODENOSCOPY (EGD) WITH PROPOFOL N/A 09/29/2022   Procedure: ESOPHAGOGASTRODUODENOSCOPY (EGD) WITH PROPOFOL;  Surgeon: Napoleon Form, MD;  Location: [facility] ENDOSCOPY;  Service: Gastroenterology;  Laterality: N/A;    Current Outpatient Medications  Medication Sig Dispense Refill   acetaminophen (TYLENOL) 500 MG tablet Take 500 mg by mouth every 6 (six) hours as needed for moderate pain (pain score 4-6).     amLODipine (NORVASC) 5 MG tablet Take 1 tablet (5 mg total) by mouth daily. 30 tablet 3   BELSOMRA 20 MG TABS Take 1 tablet (20 mg total) by mouth at bedtime. 30 tablet 1   dicyclomine (BENTYL) 20 MG tablet Take 1 tablet (20 mg  total) by mouth 3 (three) times daily before meals. 90 tablet 0   FLUoxetine (PROZAC) 20 MG capsule Take 1 capsule (20 mg total) by mouth daily. 30 capsule 3   hydrOXYzine (ATARAX) 50 MG tablet Take 50 mg by mouth 2 (two) times daily as needed for anxiety (sleep).     metoCLOPramide (REGLAN) 5 MG tablet Take 1 tablet (5 mg total) by mouth every 8 (eight) hours as needed for nausea. 30 tablet 1   mirtazapine (REMERON) 15 MG tablet Take 1 tablet (15 mg total) by mouth at bedtime. 30 tablet 3   pantoprazole (PROTONIX) 40 MG tablet Take 1 tablet (40 mg total) by mouth daily. (Patient taking differently: Take 40 mg by mouth daily as needed (acid reflux).) 30 tablet 3   polyethylene glycol (MIRALAX / GLYCOLAX) 17 g packet Take 17 g by mouth daily. 30 each 0   pregabalin (LYRICA) 75 MG capsule Take 1 capsule (75 mg total) by mouth 2 (two) times daily. 60 capsule 0   prochlorperazine (COMPAZINE) 10 MG tablet Take 1 tablet (10 mg total) by mouth every 6 (six) hours as needed for nausea or vomiting. 30 tablet 0   promethazine (PHENERGAN) 12.5 MG suppository Place 1 suppository (12.5 mg total) rectally every 8 (eight) hours as  needed for nausea or vomiting. 20 suppository 0   risperiDONE (RISPERDAL) 0.5 MG tablet Take 1 tablet (0.5 mg total) by mouth 2 (two) times daily at 8 am and 4 pm. 60 tablet 3   temazepam (RESTORIL) 30 MG capsule Take 1 capsule (30 mg total) by mouth at bedtime as needed. 30 capsule 3   traZODone (DESYREL) 100 MG tablet Take 1 tablet (100 mg total) by mouth at bedtime as needed for sleep. 30 tablet 3   HYDROmorphone (DILAUDID) 4 MG tablet Take 1 tablet (4 mg total) by mouth every 6 (six) hours as needed for severe pain (pain score 7-10). (Patient not taking: Reported on 03/16/2023) 20 tablet 0   ondansetron (ZOFRAN-ODT) 4 MG disintegrating tablet 4mg  ODT q4 hours prn nausea/vomit (Patient not taking: Reported on 03/16/2023) 12 tablet 0   No current facility-administered medications for  this visit.    Allergies as of 03/16/2023 - Review Complete 03/15/2023  Allergen Reaction Noted   Hydroxyzine Other (See Comments) 06/07/2018   Ambien [zolpidem tartrate] Other (See Comments) 06/07/2018    Family History  Problem Relation Age of Onset   Cancer Maternal Uncle        Lung   Cancer Maternal Uncle        Lung   Cancer Maternal Uncle        Lung   Headache Neg Hx        "I don't think so"   Migraines Neg Hx        "I don't think so"    Social History   Socioeconomic History   Marital status: Single    Spouse name: Not on file   Number of children: 3   Years of education: Not on file   Highest education level: 12th grade  Occupational History   Occupation: environmental services at [employer]  Tobacco Use   Smoking status: Every Day    Current packs/day: 0.50    Average packs/day: 0.5 packs/day for 25.0 years (12.5 ttl pk-yrs)    Types: Cigarettes   Smokeless tobacco: Never   Tobacco comments:    Pt tried to quit - helps with anxiety     1 pack last patient 3 days   Vaping Use   Vaping status: Never Used  Substance and Sexual Activity   Alcohol use: Yes    Comment: occasionally - last drink was July 4, 1 shot   Drug use: No   Sexual activity: Yes    Comment: hysterectomy  Other Topics Concern   Not on file  Social History Narrative   Lives at home in an apartment. Her mother lives with her.    Right handed   Caffeine: drinks approx. 36 oz of pepsi per day. Sometimes drinks 2 cups of coffee in a day as well.    Social Drivers of Corporate investment banker Strain: Low Risk  (03/06/2023)   Overall Financial Resource Strain (CARDIA)    Difficulty of Paying Living Expenses: Not very hard  Food Insecurity: Food Insecurity Present (03/06/2023)   Hunger Vital Sign    Worried About Running Out of Food in the Last Year: Sometimes true    Ran Out of Food in the Last Year: Often true  Transportation Needs: Unmet Transportation Needs (03/06/2023)    PRAPARE - Administrator, Civil Service (Medical): Yes    Lack of Transportation (Non-Medical): Yes  Physical Activity: Sufficiently Active (05/22/2022)   Exercise Vital Sign    Days of  Exercise per Week: 4 days    Minutes of Exercise per Session: 70 min  Stress: Stress Concern Present (03/06/2023)   [employer] of Occupational Health - Occupational Stress Questionnaire    Feeling of Stress : Very much  Social Connections: Socially Isolated (03/06/2023)   Social Connection and Isolation Panel [NHANES]    Frequency of Communication with Friends and Family: More than three times a week    Frequency of Social Gatherings with Friends and Family: Never    Attends Religious Services: Never    Database administrator or Organizations: No    Attends Banker Meetings: Never    Marital Status: Separated  Intimate Partner Violence: Not At Risk (01/18/2023)   Humiliation, Afraid, Rape, and Kick questionnaire    Fear of Current or Ex-Partner: No    Emotionally Abused: No    Physically Abused: No    Sexually Abused: No    Review of Systems:    Constitutional: No weight loss, fever, chills, weakness or fatigue HEENT: Eyes: No change in vision               Ears, Nose, Throat:  No change in hearing or congestion Skin: No rash or itching Cardiovascular: No chest pain, chest pressure or palpitations   Respiratory: No SOB or cough Gastrointestinal: See HPI and otherwise negative Genitourinary: No dysuria or change in urinary frequency Neurological: No headache, dizziness or syncope Musculoskeletal: No new muscle or joint pain Hematologic: No bleeding or bruising Psychiatric: No history of depression or anxiety    Physical Exam:  Vital signs: BP (!) 134/94   Pulse (!) 105   Ht 5\' 4"  (1.626 m)   Wt 174 lb (78.9 kg)   BMI 29.87 kg/m   Constitutional: NAD, Well developed, Well nourished, alert and cooperative Head:  Normocephalic and atraumatic. Eyes:   PEERL,  EOMI. No icterus. Conjunctiva pink. Respiratory: Respirations even and unlabored. Lungs clear to auscultation bilaterally.   No wheezes, crackles, or rhonchi.  Cardiovascular:  Regular rate and rhythm. No peripheral edema, cyanosis or pallor.  Gastrointestinal:  Soft, nondistended, nontender. No rebound or guarding. Normal bowel sounds. No appreciable masses or hepatomegaly. Rectal:  Not performed.  Msk:  Symmetrical without gross deformities. Without edema, no deformity or joint abnormality.  Neurologic:  Alert and  oriented x4;  grossly normal neurologically.  Skin:   Dry and intact without significant lesions or rashes. Psychiatric: Oriented to person, place and time. Demonstrates good judgement and reason without abnormal affect or behaviors.   RELEVANT LABS AND IMAGING: CBC     Latest Ref Rng & Units 03/15/2023    5:46 PM 03/04/2023    9:28 AM 01/22/2023    6:43 AM  CBC  WBC 4.0 - 10.5 K/uL 17.3  12.8  10.0   Hemoglobin 12.0 - 15.0 g/dL 16.1  09.6  04.5   Hematocrit 36.0 - 46.0 % 36.5  40.1  35.4   Platelets 150 - 400 K/uL 489  641  414      CMP     Latest Ref Rng & Units 03/15/2023    5:46 PM 03/04/2023    9:28 AM 01/22/2023    6:43 AM  CMP  Glucose 70 - 99 mg/dL 97  81  86   BUN 6 - 20 mg/dL 10  19  14    Creatinine 0.44 - 1.00 mg/dL 4.09  8.11  9.14   Sodium 135 - 145 mmol/L 138  138  138  Potassium 3.5 - 5.1 mmol/L 3.4  3.8  4.1   Chloride 98 - 111 mmol/L 108  109  100   CO2 22 - 32 mmol/L 20  17  25    Calcium 8.9 - 10.3 mg/dL 9.4  9.2  9.0   Total Protein 6.5 - 8.1 g/dL 7.4  8.2  6.2   Total Bilirubin <1.2 mg/dL 0.4  1.0  0.2   Alkaline Phos 38 - 126 U/L 54  66  45   AST 15 - 41 U/L 18  17  19    ALT 0 - 44 U/L 16  22  19         Assessment/Plan:   57 year old female history of PUD on EGD July 2024, multiple negative CT scans, presenting with chronic LLQ pain, constipation, nausea, and vomiting with minimal improvement on Reglan, famotidine, Bentyl, Zofran,  pantoprazole.  Nausea and vomiting When I evaluated patient in the hospital she was eating well and having no issues as long she was having bowel movements regularly.  Reglan provides minimal relief with nausea and vomiting.  Suspect her nausea and vomiting is multifactorial but more likely secondary to constipation as it is worse when she has not had a bowel movement.  No improvement on multiple antacids.  Although she does have history of PUD noted on recent EGD. - EGD to document healing of ulcers - I thoroughly discussed the procedure with the patient (at bedside) to include nature of the procedure, alternatives, benefits, and risks (including but not limited to bleeding, infection, perforation, anesthesia/cardiac pulmonary complications).  Patient verbalized understanding and gave verbal consent to proceed with procedure. - Optimize bowel regimen - Continue Reglan - Consider doing gastric emptying study off of Reglan if patient can tolerate - Phenergan suppositories 12.5 Mg every 8 hours for 14 days as she feels she cannot tolerate Zofran dissolving tablets.  LLQ pain Constipation Patient will go a week without a bowel movement and that is typically what makes her symptoms like LLQ pain and nausea/vomiting worse.  Suspect her constellation of symptoms are more likely related to IBS with extensive negative CT scans and negative EGD.  Last colonoscopy in 2018 at [facility] but unable to read report.  She is not currently on a bowel regimen. - Provided samples of Linzess 72 mcg once daily - Optimize bowel regimen - ED precautions - Colonoscopy for further evaluation - I thoroughly discussed the procedure with the patient (at bedside) to include nature of the procedure, alternatives, benefits, and risks (including but not limited to bleeding, infection, perforation, anesthesia/cardiac pulmonary complications).  Patient verbalized understanding and gave verbal consent to proceed with procedure. -  IBgard for abdominal pain.  Anticholinergic such as Bentyl will worsen her constipation issues.  Lara Mulch [facility] 03/16/2023, 1:34 PM  Cc: [name], MD

## 2023-03-16 NOTE — Telephone Encounter (Signed)
Patient calling in regards to scheduling procedure. Please advise.

## 2023-03-17 ENCOUNTER — Other Ambulatory Visit: Payer: Self-pay

## 2023-03-17 DIAGNOSIS — K5900 Constipation, unspecified: Secondary | ICD-10-CM

## 2023-03-20 ENCOUNTER — Telehealth: Payer: Self-pay | Admitting: Gastroenterology

## 2023-03-20 ENCOUNTER — Ambulatory Visit
Admission: RE | Admit: 2023-03-20 | Discharge: 2023-03-20 | Disposition: A | Discharge status: Home / Self Care | Payer: 59 | Source: Ambulatory Visit | Attending: Gastroenterology

## 2023-03-20 DIAGNOSIS — K5900 Constipation, unspecified: Secondary | ICD-10-CM

## 2023-03-20 NOTE — Telephone Encounter (Signed)
 Patient calls stating that she has been having sharp, constant LLQ abdominal pain. She denies any fever, denies any nausea/vomiting (which she says she normally has). States that she had a large bowel movement yesterday. No blood. She tells me that this pain is very similar to pain she has felt before. Patient says that she has been taking Linzess samples given to her and these seem to help her with having formed, more frequent bowel movements. I have suggested she continue Linzess and eat foods high in fiber with increased water intake to at least 64 ounces per day. In addition, I have also recommended the addition of a fiber supplement (metamucil or benefiber) daily to help with constipation. Patient is scheduled for endoscopy/colonoscopy on 04/07/23 and states that she is aware of this appointment and has already picked up the medication for prep.

## 2023-03-20 NOTE — Telephone Encounter (Signed)
 Pt states she is experiencing left abdominal pain. Pt wants to know if there is medication that could be prescribed to her. Please advise.

## 2023-03-26 ENCOUNTER — Telehealth: Payer: Self-pay | Admitting: Family Medicine

## 2023-03-26 NOTE — Telephone Encounter (Signed)
 Natalie Edwards

## 2023-03-30 ENCOUNTER — Ambulatory Visit: Payer: 59 | Admitting: Gastroenterology

## 2023-04-01 ENCOUNTER — Other Ambulatory Visit: Payer: Self-pay | Admitting: Family Medicine

## 2023-04-06 ENCOUNTER — Telehealth: Payer: Self-pay | Admitting: Family Medicine

## 2023-04-06 NOTE — Telephone Encounter (Signed)
Copied from CRM [id number]. Topic: Referral - Request for Referral >> Apr 06, 2023 11:56 AM Shon Hale wrote: Did the patient discuss referral with their provider in the last year? No (If No - schedule appointment) (If Yes - send message)  Appointment offered? Yes, pt states she is no longer in [place] but is needing referral.   Type of order/referral and detailed reason for visit: Pulmonary, pt has moved and is wanting to establish care.  Preference of office, provider, location: [facility] - [place] Phone: [phone] Fax: [phone]  If referral order, have you been seen by this specialty before? No (If Yes, this issue or another issue? When? Where? Yes, [facility] at [facility] [address] [place],  [place]  [address] Main: [phone] Fax: [phone]  Can we respond through MyChart? Yes, pt requesting call with update

## 2023-04-07 ENCOUNTER — Other Ambulatory Visit: Payer: Self-pay | Admitting: Physician Assistant

## 2023-04-07 ENCOUNTER — Ambulatory Visit: Payer: 59 | Admitting: Internal Medicine

## 2023-04-07 ENCOUNTER — Telehealth: Payer: Self-pay | Admitting: Family Medicine

## 2023-04-07 ENCOUNTER — Encounter: Payer: Self-pay | Admitting: Internal Medicine

## 2023-04-07 VITALS — BP 154/70 | HR 115 | Temp 97.3°F | Resp 15 | Ht 64.0 in | Wt 174.0 lb

## 2023-04-07 DIAGNOSIS — K5900 Constipation, unspecified: Secondary | ICD-10-CM

## 2023-04-07 DIAGNOSIS — K319 Disease of stomach and duodenum, unspecified: Secondary | ICD-10-CM | POA: Diagnosis not present

## 2023-04-07 DIAGNOSIS — K279 Peptic ulcer, site unspecified, unspecified as acute or chronic, without hemorrhage or perforation: Secondary | ICD-10-CM

## 2023-04-07 DIAGNOSIS — D75839 Thrombocytosis, unspecified: Secondary | ICD-10-CM

## 2023-04-07 DIAGNOSIS — D72825 Bandemia: Secondary | ICD-10-CM

## 2023-04-07 DIAGNOSIS — K3189 Other diseases of stomach and duodenum: Secondary | ICD-10-CM | POA: Diagnosis not present

## 2023-04-07 DIAGNOSIS — K5730 Diverticulosis of large intestine without perforation or abscess without bleeding: Secondary | ICD-10-CM

## 2023-04-07 DIAGNOSIS — D500 Iron deficiency anemia secondary to blood loss (chronic): Secondary | ICD-10-CM

## 2023-04-07 HISTORY — PX: COLONOSCOPY WITH ESOPHAGOGASTRODUODENOSCOPY (EGD): SHX5779

## 2023-04-07 MED ORDER — SODIUM CHLORIDE 0.9 % IV SOLN: 500.0000 mL | Freq: Once | INTRAVENOUS | Status: DC

## 2023-04-07 NOTE — Telephone Encounter (Signed)
Pt is calling in checking on the status of her referral for a "Sleep Doctor". Pt is requesting one in [place] because that is where she is currently staying. Please follow up with pt.

## 2023-04-07 NOTE — Progress Notes (Deleted)
[facility] [phone]   [phone]  PROGRESS NOTE  Patient Care Team: [name], MD as PCP - General (Family Medicine)  Hematological/Oncological History  #Leukocytosis #Thrombocytosis #Iron Deficiency 1) 09/09/2016: MPN testing with JAK2 reflex to MPL and CLR. This testing was negative for a mutation. BCR/ABL not tested.  2) 09/23/2016: establish care with Dr. Gweneth Dimitri 3) 06/03/2018: WBC 15.9 HB 12.4 plts 557 4) 07/28/2018: WBC 12.8 HB 11.3 plts 455. Sed rate 16 (nml). Feraheme ordered, but patient never received the iron. .  5) 03/08/2019: Establish care with Dr. Leonides Schanz  6) 2020-10/10/2022: lost to follow up 7) 10/10/2022: presenting to re-establish care.   Interval History:  Natalie Edwards 58 y.o. female with medical history significant for iron deficiency anemia presents for a follow up visit. She was last seen by Dr. Leonides Schanz on 10/10/2022.   ***  On exam today Natalie Edwards notes she continues to have poor energy and reports that she cannot walk from the clinic room to the lobby without getting out of breath.  She has not had any overt signs of bleeding with no dark stools, gum bleeding, or nosebleeds.  She reports that she has had a hysterectomy and does not have any menstrual bleeding or GYN bleeding.  She reports that she feels like she is eating well and does eat iron rich foods such as hamburgers and steak.  She endorses having lightheadedness and dizziness.  She is not currently taking any iron supplements.  She reports that a pack of cigarettes would last for about 3-1/2 days, though on some bad days she will smoke a full pack.  She does drink alcohol on occasion.  She has had no issues with rash, itching, or joint pain.  Otherwise she denies any fevers, chills, sweats, nausea, vomiting or diarrhea.  A full 10 point ROS is listed below.  MEDICAL HISTORY:  Past Medical History:  Diagnosis Date   Allergy    Anemia    Diverticulosis    Gall  bladder stones 1993   Gastric ulcer    GERD (gastroesophageal reflux disease)    Hypertension    Renal mass     SURGICAL HISTORY: Past Surgical History:  Procedure Laterality Date   ABDOMINAL HYSTERECTOMY     BIOPSY  09/29/2022   Procedure: BIOPSY;  Surgeon: Napoleon Form, MD;  Location: [facility];  Service: Gastroenterology;;   CHOLECYSTECTOMY     COLONOSCOPY WITH ESOPHAGOGASTRODUODENOSCOPY (EGD)  04/07/2023   [facility]   ESOPHAGOGASTRODUODENOSCOPY (EGD) WITH PROPOFOL N/A 09/29/2022   Procedure: ESOPHAGOGASTRODUODENOSCOPY (EGD) WITH PROPOFOL;  Surgeon: Napoleon Form, MD;  Location: [facility] END[facility]COPY;  Service: Gastroenterology;  Laterality: N/A;    SOCIAL HISTORY: Social History   Socioeconomic History   Marital status: Single    Spouse name: Not on file   Number of children: 3   Years of education: Not on file   Highest education level: 12th grade  Occupational History   Occupation: environmental services at [employer]  Tobacco Use   Smoking status: Every Day    Current packs/day: 0.50    Average packs/day: 0.5 packs/day for 25.0 years (12.5 ttl pk-yrs)    Types: Cigarettes   Smokeless tobacco: Never   Tobacco comments:    Pt tried to quit - helps with anxiety     1 pack last patient 3 days   Vaping Use   Vaping status: Never Used  Substance and Sexual Activity   Alcohol use: Yes  Comment: occasionally - last drink was July 4, 1 shot   Drug use: No   Sexual activity: Yes    Comment: hysterectomy  Other Topics Concern   Not on file  Social History Narrative   Lives at home in an apartment. Her mother lives with her.    Right handed   Caffeine: drinks approx. 36 oz of pepsi per day. Sometimes drinks 2 cups of coffee in a day as well.    Social Drivers of Corporate investment banker Strain: Low Risk  (03/06/2023)   Overall Financial Resource Strain (CARDIA)    Difficulty of Paying Living Expenses: Not very hard  Food Insecurity: Food  Insecurity Present (03/06/2023)   Hunger Vital Sign    Worried About Running Out of Food in the Last Year: Sometimes true    Ran Out of Food in the Last Year: Often true  Transportation Needs: Unmet Transportation Needs (03/06/2023)   PRAPARE - Administrator, Civil Service (Medical): Yes    Lack of Transportation (Non-Medical): Yes  Physical Activity: Sufficiently Active (05/22/2022)   Exercise Vital Sign    Days of Exercise per Week: 4 days    Minutes of Exercise per Session: 70 min  Stress: Stress Concern Present (03/06/2023)   [employer] of Occupational Health - Occupational Stress Questionnaire    Feeling of Stress : Very much  Social Connections: Socially Isolated (03/06/2023)   Social Connection and Isolation Panel [NHANES]    Frequency of Communication with Friends and Family: More than three times a week    Frequency of Social Gatherings with Friends and Family: Never    Attends Religious Services: Never    Database administrator or Organizations: No    Attends Banker Meetings: Never    Marital Status: Separated  Intimate Partner Violence: Not At Risk (01/18/2023)   Humiliation, Afraid, Rape, and Kick questionnaire    Fear of Current or Ex-Partner: No    Emotionally Abused: No    Physically Abused: No    Sexually Abused: No    FAMILY HISTORY: Family History  Problem Relation Age of Onset   Cancer Maternal Uncle        Lung   Cancer Maternal Uncle        Lung   Cancer Maternal Uncle        Lung   Headache Neg Hx        "I don't think so"   Migraines Neg Hx        "I don't think so"    ALLERGIES:  is allergic to hydroxyzine, ambien [zolpidem tartrate], and ibuprofen.  MEDICATIONS:  Current Outpatient Medications  Medication Sig Dispense Refill   acetaminophen (TYLENOL) 500 MG tablet Take 500 mg by mouth every 6 (six) hours as needed for moderate pain (pain score 4-6).     amLODipine (NORVASC) 5 MG tablet Take 1 tablet (5 mg  total) by mouth daily. 30 tablet 3   BELSOMRA 20 MG TABS Take 1 tablet (20 mg total) by mouth at bedtime. 30 tablet 1   dicyclomine (BENTYL) 20 MG tablet Take 1 tablet (20 mg total) by mouth 3 (three) times daily before meals. 90 tablet 0   FLUoxetine (PROZAC) 20 MG capsule Take 1 capsule (20 mg total) by mouth daily. 30 capsule 3   HYDROmorphone (DILAUDID) 4 MG tablet Take 1 tablet (4 mg total) by mouth every 6 (six) hours as needed for severe pain (pain score 7-10). (Patient  not taking: Reported on 03/16/2023) 20 tablet 0   hydrOXYzine (ATARAX) 50 MG tablet Take 50 mg by mouth 2 (two) times daily as needed for anxiety (sleep).     metoCLOPramide (REGLAN) 5 MG tablet Take 1 tablet (5 mg total) by mouth every 8 (eight) hours as needed for nausea. 30 tablet 1   mirtazapine (REMERON) 15 MG tablet Take 1 tablet (15 mg total) by mouth at bedtime. 30 tablet 3   mirtazapine (REMERON) 45 MG tablet TAKE 1 TABLET BY MOUTH AT  BEDTIME 90 tablet 3   ondansetron (ZOFRAN-ODT) 4 MG disintegrating tablet 4mg  ODT q4 hours prn nausea/vomit (Patient not taking: Reported on 04/07/2023) 12 tablet 0   pantoprazole (PROTONIX) 40 MG tablet Take 1 tablet (40 mg total) by mouth daily. (Patient taking differently: Take 40 mg by mouth daily as needed (acid reflux).) 30 tablet 3   polyethylene glycol (MIRALAX / GLYCOLAX) 17 g packet Take 17 g by mouth daily. 30 each 0   pregabalin (LYRICA) 75 MG capsule Take 1 capsule (75 mg total) by mouth 2 (two) times daily. 60 capsule 0   prochlorperazine (COMPAZINE) 10 MG tablet Take 1 tablet (10 mg total) by mouth every 6 (six) hours as needed for nausea or vomiting. 30 tablet 0   promethazine (PHENERGAN) 12.5 MG suppository Place 1 suppository (12.5 mg total) rectally every 8 (eight) hours as needed for nausea or vomiting. 20 suppository 0   risperiDONE (RISPERDAL) 0.5 MG tablet Take 1 tablet (0.5 mg total) by mouth 2 (two) times daily at 8 am and 4 pm. 60 tablet 3   temazepam (RESTORIL)  30 MG capsule Take 1 capsule (30 mg total) by mouth at bedtime as needed. 30 capsule 3   traZODone (DESYREL) 100 MG tablet Take 1 tablet (100 mg total) by mouth at bedtime as needed for sleep. 30 tablet 3   No current facility-administered medications for this visit.    REVIEW OF SYSTEMS:   Constitutional: ( - ) fevers, ( - )  chills , ( - ) night sweats (+) insomnia Eyes: ( - ) blurriness of vision, ( - ) double vision, ( - ) watery eyes Ears, nose, mouth, throat, and face: ( - ) mucositis, ( - ) sore throat Respiratory: ( - ) cough, ( - ) dyspnea, ( - ) wheezes Cardiovascular: ( - ) palpitation, ( - ) chest discomfort, ( - ) lower extremity swelling Gastrointestinal:  ( - ) nausea, ( - ) heartburn, ( - ) change in bowel habits Skin: ( - ) abnormal skin rashes Lymphatics: ( - ) new lymphadenopathy, ( - ) easy bruising Neurological: ( - ) numbness, ( - ) tingling, ( - ) new weaknesses Behavioral/Psych: ( - ) mood change, ( - ) new changes  All other systems were reviewed with the patient and are negative.  PHYSICAL EXAMINATION: ECOG PERFORMANCE STATUS: 0 - Asymptomatic  There were no vitals filed for this visit.   There were no vitals filed for this visit.    GENERAL: well appearing middle aged African American female in NAD  SKIN: skin color, texture, turgor are normal, no rashes or significant lesions EYES: conjunctiva are pink and non-injected, sclera clear LUNGS: clear to auscultation and percussion with normal breathing effort HEART: regular rate & rhythm and no murmurs and no lower extremity edema ABDOMEN: soft, non-tender, non-distended, normal bowel sounds Musculoskeletal: no cyanosis of digits and no clubbing  PSYCH: alert & oriented x 3, fluent speech NEURO: no  focal motor/sensory deficits  LABORATORY DATA:  I have reviewed the data as listed Lab Results  Component Value Date   WBC 17.3 (H) 03/15/2023   HGB 12.1 03/15/2023   HCT 36.5 03/15/2023   MCV 81.8  03/15/2023   PLT 489 (H) 03/15/2023   NEUTROABS 10.7 (H) 03/15/2023    BLOOD FILM:  Review of the peripheral blood smear showed normal appearing white cells with neutrophils that were appropriately lobated and granulated. There was no predominance of bi-lobed or hyper-segmented neutrophils appreciated. No Dohle bodies were noted. There was no left shifting, immature forms or blasts noted. Lymphocytes remain normal in size without any predominance of large granular lymphocytes. Red cells show no anisopoikilocytosis, macrocytes , microcytes or polychromasia. There were target cells, but no schistocytes,echinocytes, acanthocytes, dacrocytes, or stomatocytes.There was no rouleaux formation, nucleated red cells, or intra-cellular inclusions noted. The platelets are normal in size, shape, and color without any clumping evident.  RADIOGRAPHIC STUDIES: None relevant to review.  DG Abd 1 View Result Date: 03/26/2023 CLINICAL DATA:  Nausea and vomiting. EXAM: ABDOMEN - 1 VIEW COMPARISON:  CT abdomen pelvis 03/04/2023 FINDINGS: Cholecystectomy clips. Large amount of stool throughout the colon. Nonobstructed bowel-gas pattern. Osseous structures unremarkable. IMPRESSION: Large amount of stool throughout the colon. Electronically Signed   By: Annia Belt M.D.   On: 03/26/2023 21:48    ASSESSMENT & PLAN Nomie Virgilio is a 58 y.o.[name] female with medical history significant for iron deficiency anemia presents for a follow up visit.  After review of the labs and discussion with the patient her findings are most consistent with an iron deficiency without current anemia.  The patient was scheduled for IV iron in May of this year, however due to scheduling conflicts this was not performed and the patient received no iron supplementation.  On review today the patient still has some symptoms of iron deficiency including fatigue and ice craving.  On review her iron studies show low serum iron with a high normal TIBC a 9% sat  and ferritin 42.  Given these findings I will recommend p.o. iron supplementation with iron sulfate and reevaluation in 3 months time to determine if her iron studies improve, her platelet count decreases, and her ice craving subsist.  Additionally she has a longstanding leukocytosis which dates back to at least 2018 prior records.  On discussion with the patient she is currently an active smoker and that could potentially explain the findings we are seeing here.  If with appropriate iron supplementation she continues to have a thrombocytosis I think it would be reasonable to further upper MPN work-up with testing of BCR ABL.  She previously had MPN testing with Dr. Gweneth Dimitri which showed none of the classical mutations including JAK2, MPL, and CAL R.   Relevant studies:  09/12/2016: EGD with non-bleeding ulcer.   #Iron Deficiency without Anemia #Thrombocytosis, stable --today will reorder CBC, CMP, iron panel and ferritin  --on review of prior labs the patient was iron deficient but never received supplementation --will order PO iron sulfate 325mg  daily with 500mg  vitamin C tablets (patient cannot tolerate OJ due to peptic ulcers) --source of bleeding is thought to be GI from gastric ulcer bleeding. Patient does not have any further GYN bleeding since hysterectomy in 2007.  --labs today show WBC ***, Hgb ***, MCV 84.6, Plt *** --RTC *** #Leukocytosis, stable --present and stable on labs for the last 2 years --likely 2/2 to smoking, though in combination with thrombocytosis may indicate an MPN such  as CML (tested negative for JAK2, MPL, and CALR prior). Will order BCR/ABL FISH today.  --ESR elevated at last visit.  --continue to monitor.   All questions were answered. The patient knows to call the clinic with any problems, questions or concerns.   I have spent a total of {CHL ONC TIME VISIT - [id number]} minutes of face-to-face and non-face-to-face time, preparing to see the patient,  obtaining and/or reviewing separately obtained history, performing a medically appropriate examination, counseling and educating the patient, ordering medications/tests/procedures, referring and communicating with other health care professionals, documenting clinical information in the electronic health record, independently interpreting results and communicating results to the patient, and care coordination.   Georga Kaufmann PA-C Dept of Hematology and Oncology [facility] Phone: [phone]   04/07/2023 10:19 PM

## 2023-04-07 NOTE — Progress Notes (Signed)
Sedate, gd SR, tolerated procedure well, VSS, report to RN 

## 2023-04-07 NOTE — Op Note (Signed)
[facility] Patient Name: Natalie Edwards Procedure Date: 04/07/2023 2:11 PM MRN: [id number] Endoscopist: Iva Boop , MD, [id number] Age: 58 Referring MD:  Date of Birth: 01-Sep-1965 Gender: Female Account #: [id number] Procedure:                Upper GI endoscopy Indications:              Follow-up of gastric ulcer Medicines:                Monitored Anesthesia Care Procedure:                Pre-Anesthesia Assessment:                           - Prior to the procedure, a History and Physical                            was performed, and patient medications and                            allergies were reviewed. The patient's tolerance of                            previous anesthesia was also reviewed. The risks                            and benefits of the procedure and the sedation                            options and risks were discussed with the patient.                            All questions were answered, and informed consent                            was obtained. Prior Anticoagulants: The patient has                            taken no anticoagulant or antiplatelet agents. ASA                            Grade Assessment: II - A patient with mild systemic                            disease. After reviewing the risks and benefits,                            the patient was deemed in satisfactory condition to                            undergo the procedure.                           After obtaining informed consent, the endoscope was  passed under direct vision. Throughout the                            procedure, the patient's blood pressure, pulse, and                            oxygen saturations were monitored continuously. The                            Olympus Scope [id number] was introduced through the                            mouth, and advanced to the second part of duodenum.                            The upper GI endoscopy  was accomplished without                            difficulty. The patient tolerated the procedure                            well. Scope In: Scope Out: Findings:                 Localized prominent gastric folds were found in the                            gastric antrum. Biopsies were taken with a cold                            forceps for histology. Verification of patient                            identification for the specimen was done. Estimated                            blood loss was minimal.                           A small healed ulcer was found in the gastric                            antrum. Biopsies were taken with a cold forceps for                            histology. Verification of patient identification                            for the specimen was done. Estimated blood loss was                            minimal.                           The exam was otherwise without abnormality.  The cardia and gastric fundus were otherwise normal                            on retroflexion. Complications:            No immediate complications. Estimated Blood Loss:     Estimated blood loss was minimal. Impression:               - Enlarged gastric folds. Biopsied. Likely same                            thing described as a papule in 2024 - which was                            inflamed mucosa from prolapse                           - Scar in the gastric antrum. Biopsied.                           - The examination was otherwise normal. Recommendation:           - Patient has a contact number available for                            emergencies. The signs and symptoms of potential                            delayed complications were discussed with the                            patient. Return to normal activities tomorrow.                            Written discharge instructions were provided to the                            patient.                            - Continue present medications. Note that she is                            much better without significan nausea and vomiting                            after treatment of constipation w/ Linzess and                            Miralax/fiber                           - Resume previous diet.                           - See the other procedure note for documentation of  additional recommendations. Iva Boop, MD 04/07/2023 3:23:29 PM This report has been signed electronically.

## 2023-04-07 NOTE — Op Note (Signed)
[facility] Patient Name: Natalie Edwards Procedure Date: 04/07/2023 2:02 PM MRN: [id number] Endoscopist: Iva Boop , MD, [id number] Age: 58 Referring MD:  Date of Birth: 01/17/1966 Gender: Female Account #: [id number] Procedure:                Colonoscopy Indications:              Constipation Medicines:                Monitored Anesthesia Care Procedure:                Pre-Anesthesia Assessment:                           - Prior to the procedure, a History and Physical                            was performed, and patient medications and                            allergies were reviewed. The patient's tolerance of                            previous anesthesia was also reviewed. The risks                            and benefits of the procedure and the sedation                            options and risks were discussed with the patient.                            All questions were answered, and informed consent                            was obtained. Prior Anticoagulants: The patient has                            taken no anticoagulant or antiplatelet agents. ASA                            Grade Assessment: II - A patient with mild systemic                            disease. After reviewing the risks and benefits,                            the patient was deemed in satisfactory condition to                            undergo the procedure.                           After obtaining informed consent, the colonoscope  was passed under direct vision. Throughout the                            procedure, the patient's blood pressure, pulse, and                            oxygen saturations were monitored continuously. The                            Olympus Scope SN: [id number] was introduced through                            the anus and advanced to the the cecum, identified                            by appendiceal orifice and ileocecal valve.  The                            colonoscopy was somewhat difficult due to                            significant looping, low - rectosigmoid area.                            Successful completion of the procedure was aided by                            straightening and shortening the scope to obtain                            bowel loop reduction and using scope torsion. The                            patient tolerated the procedure well. The quality                            of the bowel preparation was adequate. The bowel                            preparation used was SUPREP via split dose                            instruction. The ileocecal valve, appendiceal                            orifice, and rectum were photographed. Scope In: 2:48:18 PM Scope Out: 3:10:17 PM Scope Withdrawal Time: 0 hours 13 minutes 50 seconds  Total Procedure Duration: 0 hours 21 minutes 59 seconds  Findings:                 The perianal and digital rectal examinations were                            normal.  Multiple diverticula were found in the sigmoid                            colon.                           The exam was otherwise without abnormality on                            direct and retroflexion views. Complications:            No immediate complications. Estimated Blood Loss:     Estimated blood loss: none. Impression:               - Diverticulosis in the sigmoid colon.                           - The examination was otherwise normal on direct                            and retroflexion views.                           - No specimens collected. Recommendation:           - Patient has a contact number available for                            emergencies. The signs and symptoms of potential                            delayed complications were discussed with the                            patient. Return to normal activities tomorrow.                             Written discharge instructions were provided to the                            patient.                           - Resume previous diet.                           - Continue present medications. luinzess + MiraLax                            and fiber are helping                           - Repeat colonoscopy in 10 years for screening                            purposes. recall under Dr. Aris Lot, MD 04/07/2023 3:27:40 PM This report has been signed electronically.

## 2023-04-07 NOTE — Progress Notes (Signed)
Pt's states no medical or surgical changes since previsit or office visit. 

## 2023-04-07 NOTE — Progress Notes (Signed)
Called to room to assist during endoscopic procedure.  Patient ID and intended procedure confirmed with present staff. Received instructions for my participation in the procedure from the performing physician.  

## 2023-04-07 NOTE — Patient Instructions (Addendum)
The stomach ulcer looked healed - I biopsied the scar. There was a swollen fold in the stomach - I checked that with biopsies. I do not hink anything bad is going on.  Colonoscopy was ok - you do have something called diverticulosis but no polyps or cancer.  Please continue with your current treatment plan since that is helping.  I appreciate the opportunity to care for you. Iva Boop, MD, FACG   YOU HAD AN ENDOSCOPIC PROCEDURE TODAY AT THE [facility]:   Refer to the procedure report that was given to you for any specific questions about what was found during the examination.  If the procedure report does not answer your questions, please call your gastroenterologist to clarify.  If you requested that your care partner not be given the details of your procedure findings, then the procedure report has been included in a sealed envelope for you to review at your convenience later.  YOU SHOULD EXPECT: Some feelings of bloating in the abdomen. Passage of more gas than usual.  Walking can help get rid of the air that was put into your GI tract during the procedure and reduce the bloating. If you had a lower endoscopy (such as a colonoscopy or flexible sigmoidoscopy) you may notice spotting of blood in your stool or on the toilet paper. If you underwent a bowel prep for your procedure, you may not have a normal bowel movement for a few days.  Please Note:  You might notice some irritation and congestion in your nose or some drainage.  This is from the oxygen used during your procedure.  There is no need for concern and it should clear up in a day or so.  SYMPTOMS TO REPORT IMMEDIATELY:  Following lower endoscopy (colonoscopy or flexible sigmoidoscopy):  Excessive amounts of blood in the stool  Significant tenderness or worsening of abdominal pains  Swelling of the abdomen that is new, acute  Fever of 100F or higher  Following upper endoscopy (EGD)  Vomiting of blood or coffee  ground material  New chest pain or pain under the shoulder blades  Painful or persistently difficult swallowing  New shortness of breath  Fever of 100F or higher  Black, tarry-looking stools  For urgent or emergent issues, a gastroenterologist can be reached at any hour by calling [phone]. Do not use MyChart messaging for urgent concerns.    DIET:  We do recommend a small meal at first, but then you may proceed to your regular diet.  Drink plenty of fluids but you should avoid alcoholic beverages for 24 hours.  ACTIVITY:  You should plan to take it easy for the rest of today and you should NOT DRIVE or use heavy machinery until tomorrow (because of the sedation medicines used during the test).    FOLLOW UP: Our staff will call the number listed on your records the next business day following your procedure.  We will call around 7:15- 8:00 am to check on you and address any questions or concerns that you may have regarding the information given to you following your procedure. If we do not reach you, we will leave a message.     If any biopsies were taken you will be contacted by phone or by letter within the next 1-3 weeks.  Please call us at [phone] if you have not heard about the biopsies in 3 weeks.    SIGNATURES/CONFIDENTIALITY: You and/or your care partner have signed paperwork which will  be entered into your electronic medical record.  These signatures attest to the fact that that the information above on your After Visit Summary has been reviewed and is understood.  Full responsibility of the confidentiality of this discharge information lies with you and/or your care-partner.

## 2023-04-07 NOTE — Progress Notes (Signed)
History and Physical Interval Note:  04/07/2023 2:22 PM  Natalie Edwards  has presented today for endoscopic procedure(s), with the diagnosis of  Encounter Diagnoses  Name Primary?   Constipation, unspecified constipation type Yes   PUD (peptic ulcer disease)   .  The various methods of evaluation and treatment have been discussed with the patient and/or family. After consideration of risks, benefits and other options for treatment, the patient has consented to  the endoscopic procedure(s).   The patient's history has been reviewed, patient examined, no change in status, stable for endoscopic procedure(s).  I have reviewed the patient's chart and labs.  Questions were answered to the patient's satisfaction.     Iva Boop, MD, [facility]

## 2023-04-08 ENCOUNTER — Inpatient Hospital Stay: Payer: 59 | Attending: Physician Assistant

## 2023-04-08 ENCOUNTER — Telehealth: Payer: Self-pay | Admitting: *Deleted

## 2023-04-08 ENCOUNTER — Inpatient Hospital Stay: Payer: 59 | Admitting: Physician Assistant

## 2023-04-08 NOTE — Telephone Encounter (Signed)
  Follow up Call-     04/07/2023    2:16 PM  Call back number  Post procedure Call Back phone  # [phone]  Permission to leave phone message Yes     Patient questions:  Do you have a fever, pain , or abdominal swelling? No. Pain Score  0 *  Have you tolerated food without any problems? Yes.    Have you been able to return to your normal activities? Yes.    Do you have any questions about your discharge instructions: Diet   No. Medications  No. Follow up visit  No.  Do you have questions or concerns about your Care? No.  Actions: * If pain score is 4 or above: No action needed, pain <4.

## 2023-04-09 ENCOUNTER — Other Ambulatory Visit: Payer: 59

## 2023-04-09 ENCOUNTER — Ambulatory Visit: Payer: 59 | Admitting: Hematology and Oncology

## 2023-04-10 ENCOUNTER — Telehealth: Payer: Self-pay | Admitting: Family Medicine

## 2023-04-10 LAB — SURGICAL PATHOLOGY

## 2023-04-10 NOTE — Telephone Encounter (Signed)
Please advise patient is calling for an update does she need appointment or is this something that can just be resent in

## 2023-04-10 NOTE — Telephone Encounter (Signed)
Preference of office, provider, location: [facility] [place] Phone: [phone] Fax: [phone]

## 2023-04-10 NOTE — Telephone Encounter (Signed)
Copied from CRM [id number]. Topic: Referral - Status >> Apr 10, 2023  9:53 AM Rona Ravens wrote: Reason for CRM: Patient called for an update on her pulmonologist referral. She advised she has already provided Dr. Andrey Campanile with the doctor information. Can you please check this for her. Thank you.

## 2023-04-13 NOTE — Telephone Encounter (Signed)
Pt called to check on the status of her referral. Provided pt with the information to [facility].

## 2023-04-13 NOTE — Telephone Encounter (Signed)
noted

## 2023-04-14 ENCOUNTER — Other Ambulatory Visit: Payer: Self-pay | Admitting: Family Medicine

## 2023-04-14 DIAGNOSIS — R0689 Other abnormalities of breathing: Secondary | ICD-10-CM

## 2023-04-14 NOTE — Telephone Encounter (Signed)
Attn: Natalie Edwards  Patient calling again regarding the status of her Referral for [place].   Patient states she needs the referral in order to get her medication, BELSOMRA 20 MG TABS [[id number] ... mirtazapine (REMERON) 45 MG tablet [[id number]]  Can you please call her and advise  Thank you

## 2023-04-15 ENCOUNTER — Ambulatory Visit: Payer: 59 | Admitting: Pulmonary Disease

## 2023-04-15 ENCOUNTER — Encounter: Payer: Self-pay | Admitting: Pulmonary Disease

## 2023-04-16 ENCOUNTER — Encounter: Payer: Self-pay | Admitting: Internal Medicine

## 2023-04-17 NOTE — Progress Notes (Signed)
 Agree with assessment/plan.  Edman Circle, MD [facility] [phone]

## 2023-05-04 ENCOUNTER — Encounter: Payer: 59 | Admitting: Gastroenterology

## 2023-05-22 ENCOUNTER — Emergency Department (HOSPITAL_COMMUNITY)

## 2023-05-22 ENCOUNTER — Inpatient Hospital Stay (HOSPITAL_COMMUNITY)
Admission: EM | Admit: 2023-05-22 | Discharge: 2023-05-26 | DRG: 917 | Disposition: A | Discharge status: Other Acute Inpatient Hospital | Attending: Internal Medicine | Admitting: Internal Medicine

## 2023-05-22 ENCOUNTER — Other Ambulatory Visit: Payer: Self-pay

## 2023-05-22 DIAGNOSIS — Z90710 Acquired absence of both cervix and uterus: Secondary | ICD-10-CM

## 2023-05-22 DIAGNOSIS — T4272XA Poisoning by unspecified antiepileptic and sedative-hypnotic drugs, intentional self-harm, initial encounter: Secondary | ICD-10-CM | POA: Diagnosis not present

## 2023-05-22 DIAGNOSIS — Z560 Unemployment, unspecified: Secondary | ICD-10-CM

## 2023-05-22 DIAGNOSIS — B961 Klebsiella pneumoniae [K. pneumoniae] as the cause of diseases classified elsewhere: Secondary | ICD-10-CM | POA: Diagnosis present

## 2023-05-22 DIAGNOSIS — Z87442 Personal history of urinary calculi: Secondary | ICD-10-CM

## 2023-05-22 DIAGNOSIS — F332 Major depressive disorder, recurrent severe without psychotic features: Secondary | ICD-10-CM | POA: Diagnosis present

## 2023-05-22 DIAGNOSIS — F419 Anxiety disorder, unspecified: Secondary | ICD-10-CM | POA: Diagnosis present

## 2023-05-22 DIAGNOSIS — Z888 Allergy status to other drugs, medicaments and biological substances status: Secondary | ICD-10-CM

## 2023-05-22 DIAGNOSIS — T6592XS Toxic effect of unspecified substance, intentional self-harm, sequela: Secondary | ICD-10-CM | POA: Diagnosis not present

## 2023-05-22 DIAGNOSIS — N179 Acute kidney failure, unspecified: Principal | ICD-10-CM

## 2023-05-22 DIAGNOSIS — T50902A Poisoning by unspecified drugs, medicaments and biological substances, intentional self-harm, initial encounter: Secondary | ICD-10-CM

## 2023-05-22 DIAGNOSIS — N3000 Acute cystitis without hematuria: Secondary | ICD-10-CM | POA: Diagnosis not present

## 2023-05-22 DIAGNOSIS — Z87891 Personal history of nicotine dependence: Secondary | ICD-10-CM

## 2023-05-22 DIAGNOSIS — Z9152 Personal history of nonsuicidal self-harm: Secondary | ICD-10-CM

## 2023-05-22 DIAGNOSIS — F1414 Cocaine abuse with cocaine-induced mood disorder: Secondary | ICD-10-CM | POA: Diagnosis present

## 2023-05-22 DIAGNOSIS — M6282 Rhabdomyolysis: Secondary | ICD-10-CM | POA: Insufficient documentation

## 2023-05-22 DIAGNOSIS — G928 Other toxic encephalopathy: Secondary | ICD-10-CM | POA: Diagnosis present

## 2023-05-22 DIAGNOSIS — Z8711 Personal history of peptic ulcer disease: Secondary | ICD-10-CM

## 2023-05-22 DIAGNOSIS — Z886 Allergy status to analgesic agent status: Secondary | ICD-10-CM

## 2023-05-22 DIAGNOSIS — N390 Urinary tract infection, site not specified: Secondary | ICD-10-CM | POA: Diagnosis present

## 2023-05-22 DIAGNOSIS — Z79899 Other long term (current) drug therapy: Secondary | ICD-10-CM

## 2023-05-22 DIAGNOSIS — I10 Essential (primary) hypertension: Secondary | ICD-10-CM | POA: Diagnosis present

## 2023-05-22 DIAGNOSIS — Z9049 Acquired absence of other specified parts of digestive tract: Secondary | ICD-10-CM

## 2023-05-22 DIAGNOSIS — T6592XA Toxic effect of unspecified substance, intentional self-harm, initial encounter: Secondary | ICD-10-CM | POA: Diagnosis not present

## 2023-05-22 DIAGNOSIS — A419 Sepsis, unspecified organism: Secondary | ICD-10-CM

## 2023-05-22 DIAGNOSIS — E860 Dehydration: Secondary | ICD-10-CM | POA: Diagnosis present

## 2023-05-22 DIAGNOSIS — K219 Gastro-esophageal reflux disease without esophagitis: Secondary | ICD-10-CM

## 2023-05-22 DIAGNOSIS — F1410 Cocaine abuse, uncomplicated: Secondary | ICD-10-CM | POA: Insufficient documentation

## 2023-05-22 DIAGNOSIS — G4700 Insomnia, unspecified: Secondary | ICD-10-CM | POA: Diagnosis present

## 2023-05-22 DIAGNOSIS — R6510 Systemic inflammatory response syndrome (SIRS) of non-infectious origin without acute organ dysfunction: Secondary | ICD-10-CM | POA: Insufficient documentation

## 2023-05-22 LAB — COMPREHENSIVE METABOLIC PANEL
ALT: 36 U/L (ref 0–44)
AST: 64 U/L — ABNORMAL HIGH (ref 15–41)
Albumin: 3.7 g/dL (ref 3.5–5.0)
Alkaline Phosphatase: 61 U/L (ref 38–126)
Anion gap: 18 — ABNORMAL HIGH (ref 5–15)
BUN: 49 mg/dL — ABNORMAL HIGH (ref 6–20)
CO2: 15 mmol/L — ABNORMAL LOW (ref 22–32)
Calcium: 9.2 mg/dL (ref 8.9–10.3)
Chloride: 105 mmol/L (ref 98–111)
Creatinine, Ser: 1.9 mg/dL — ABNORMAL HIGH (ref 0.44–1.00)
GFR, Estimated: 30 mL/min — ABNORMAL LOW (ref >60)
Glucose, Bld: 110 mg/dL — ABNORMAL HIGH (ref 70–99)
Potassium: 4.5 mmol/L (ref 3.5–5.1)
Sodium: 138 mmol/L (ref 135–145)
Total Bilirubin: 1.8 mg/dL — ABNORMAL HIGH (ref 0.0–1.2)
Total Protein: 7.4 g/dL (ref 6.5–8.1)

## 2023-05-22 LAB — ETHANOL: Alcohol, Ethyl (B): <10 mg/dL (ref <10)

## 2023-05-22 LAB — RAPID URINE DRUG SCREEN, HOSP PERFORMED
Amphetamines: NOT DETECTED
Barbiturates: NOT DETECTED
Benzodiazepines: NOT DETECTED
Cocaine: POSITIVE — AB
Opiates: NOT DETECTED
Tetrahydrocannabinol: NOT DETECTED

## 2023-05-22 LAB — URINALYSIS, ROUTINE W REFLEX MICROSCOPIC
Glucose, UA: NEGATIVE mg/dL
Ketones, ur: 40 mg/dL — AB
Nitrite: NEGATIVE
Protein, ur: 100 mg/dL — AB
Specific Gravity, Urine: 1.025 (ref 1.005–1.030)
pH: 5.5 (ref 5.0–8.0)

## 2023-05-22 LAB — CBC WITH DIFFERENTIAL/PLATELET
Abs Immature Granulocytes: 0.2 10*3/uL — ABNORMAL HIGH (ref 0.00–0.07)
Basophils Absolute: 0 10*3/uL (ref 0.0–0.1)
Basophils Relative: 0 %
Eosinophils Absolute: 0 10*3/uL (ref 0.0–0.5)
Eosinophils Relative: 0 %
HCT: 39.3 % (ref 36.0–46.0)
Hemoglobin: 13.2 g/dL (ref 12.0–15.0)
Immature Granulocytes: 1 %
Lymphocytes Relative: 16 %
Lymphs Abs: 4 10*3/uL (ref 0.7–4.0)
MCH: 27.8 pg (ref 26.0–34.0)
MCHC: 33.6 g/dL (ref 30.0–36.0)
MCV: 82.7 fL (ref 80.0–100.0)
Monocytes Absolute: 2.1 10*3/uL — ABNORMAL HIGH (ref 0.1–1.0)
Monocytes Relative: 8 %
Neutro Abs: 18.4 10*3/uL — ABNORMAL HIGH (ref 1.7–7.7)
Neutrophils Relative %: 75 %
Platelets: 473 10*3/uL — ABNORMAL HIGH (ref 150–400)
RBC: 4.75 MIL/uL (ref 3.87–5.11)
RDW: 17.5 % — ABNORMAL HIGH (ref 11.5–15.5)
WBC: 24.8 10*3/uL — ABNORMAL HIGH (ref 4.0–10.5)
nRBC: 0 % (ref 0.0–0.2)

## 2023-05-22 LAB — CBG MONITORING, ED: Glucose-Capillary: 128 mg/dL — ABNORMAL HIGH (ref 70–99)

## 2023-05-22 LAB — I-STAT CG4 LACTIC ACID, ED: Lactic Acid, Venous: 2.4 mmol/L (ref 0.5–1.9)

## 2023-05-22 LAB — SALICYLATE LEVEL: Salicylate Lvl: <7 mg/dL — ABNORMAL LOW (ref 7.0–30.0)

## 2023-05-22 LAB — ACETAMINOPHEN LEVEL
Acetaminophen (Tylenol), Serum: <10 ug/mL — ABNORMAL LOW (ref 10–30)
Acetaminophen (Tylenol), Serum: <10 ug/mL — ABNORMAL LOW (ref 10–30)

## 2023-05-22 LAB — URINALYSIS, MICROSCOPIC (REFLEX)

## 2023-05-22 LAB — CK: Total CK: 3722 U/L — ABNORMAL HIGH (ref 38–234)

## 2023-05-22 LAB — MAGNESIUM: Magnesium: 1.9 mg/dL (ref 1.7–2.4)

## 2023-05-22 MED ORDER — SODIUM CHLORIDE 0.9 % IV SOLN
2.0000 g | Freq: Once | INTRAVENOUS | Status: AC
  Administered 2023-05-22: 2 g via INTRAVENOUS
  Filled 2023-05-22: qty 20

## 2023-05-22 MED ORDER — SODIUM CHLORIDE 0.9 % IV BOLUS
1000.0000 mL | Freq: Once | INTRAVENOUS | Status: AC
  Administered 2023-05-22: 1000 mL via INTRAVENOUS

## 2023-05-22 NOTE — H&P (Signed)
 History and Physical    Natalie Edwards ZOX:[id number] DOB: 04-29-1965 DOA: 05/22/2023  PCP: [name], MD  Patient coming from: home  I have personally briefly reviewed patient's old medical records in [facility]  Chief Complaint:Found down after attempt at self arm by taking extra doses of sedative home medications.  HPI: Natalie Edwards is a 58 y.o. female with medical history significant of  HTN, GERD, anxiety and depression, Hx of SI with suicide attempts,  hx of gastric ulcers, insomnia, leukocytosis  who presents to ED brought in by family after patient was found down after attempt at self arm by taking extra doses of sedative home medications. Patient currently does not have good insight. She is not able to say why she took extra doses of her medications.  She however does state that she did not intend to kill her self.  She currently notes nausea but denies sob/chest pain Maia Plan Isabelle Course /dysuria.  ED Course:  Vitals:afeb, bp 158/77, HR 133, rr 20 sat 97%  In ED patient was note to be somnolent on arrival. Patient case discussed with poison control who recommended admission with 24 hour monitoring prior to medical clearance.  Patient also on evaluation in ED was found to have AKI as well as abnormal UA .  EKG: Sinus tachycardia to 110, LAE  Labs UDS + cocaine UA: turbid,  ketones 40,  mod LE, +bacteria   UA 0-5 Mag 1.9 ETOH<10 Salicylate<7 Tylenol<10 Wbc  24, hgb 13.2, plt473 NA: 138, K 4.5, CL 105, bicarb 15,  Clu 110, cr 1.9 ( 0.93) T-bili1.8 AG 18 CK 3.722  WJX:BJYNWGNFAO: No evidence of acute abnormality intracranially or in the cervical spine.   Cxr NAD  Pelvis xray IMPRESSION: No pelvic fracture.       txNS 1L Review of Systems: As per HPI otherwise 10 point review of systems negative.   Past Medical History:  Diagnosis Date   Allergy    Anemia    Diverticulosis    Gall bladder stones 1993   Gastric ulcer    GERD (gastroesophageal reflux  disease)    Hypertension    Renal mass     Past Surgical History:  Procedure Laterality Date   ABDOMINAL HYSTERECTOMY     BIOPSY  09/29/2022   Procedure: BIOPSY;  Surgeon: Napoleon Form, MD;  Location: [facility];  Service: Gastroenterology;;   CHOLECYSTECTOMY     COLONOSCOPY WITH ESOPHAGOGASTRODUODENOSCOPY (EGD)  04/07/2023   [facility]   ESOPHAGOGASTRODUODENOSCOPY (EGD) WITH PROPOFOL N/A 09/29/2022   Procedure: ESOPHAGOGASTRODUODENOSCOPY (EGD) WITH PROPOFOL;  Surgeon: Napoleon Form, MD;  Location: [facility];  Service: Gastroenterology;  Laterality: N/A;     reports that she has been smoking cigarettes. She has a 12.5 pack-year smoking history. She has never used smokeless tobacco. She reports current alcohol use. She reports that she does not use drugs.  Allergies  Allergen Reactions   Hydroxyzine Other (See Comments)    Palpitations and jitteriness    Ambien [Zolpidem Tartrate] Other (See Comments)    Jitteriness, nervousness, abdominal pain   Ibuprofen     Family History  Problem Relation Age of Onset   Cancer Maternal Uncle        Lung   Cancer Maternal Uncle        Lung   Cancer Maternal Uncle        Lung   Headache Neg Hx        "I don't think so"   Migraines Neg  Hx        "I don't think so"    Prior to Admission medications   Medication Sig Start Date End Date Taking? Authorizing Provider  acetaminophen (TYLENOL) 500 MG tablet Take 500 mg by mouth every 6 (six) hours as needed for moderate pain (pain score 4-6).    [provider]  amLODipine (NORVASC) 5 MG tablet Take 1 tablet (5 mg total) by mouth daily. 12/28/22   Herrick, Richard Edward, DO  BELSOMRA 20 MG TABS Take 1 tablet (20 mg total) by mouth at bedtime. 02/27/23   Luciano Cutter, MD  dicyclomine (BENTYL) 20 MG tablet Take 1 tablet (20 mg total) by mouth 3 (three) times daily before meals. 03/10/23   [name], MD  FLUoxetine (PROZAC) 20 MG capsule Take 1 capsule  (20 mg total) by mouth daily. 12/28/22   Sarina Ill, DO  HYDROmorphone (DILAUDID) 4 MG tablet Take 1 tablet (4 mg total) by mouth every 6 (six) hours as needed for severe pain (pain score 7-10). Patient not taking: Reported on 03/16/2023 03/04/23   Bethann Berkshire, MD  hydrOXYzine (ATARAX) 50 MG tablet Take 50 mg by mouth 2 (two) times daily as needed for anxiety (sleep).    [provider]  metoCLOPramide (REGLAN) 5 MG tablet Take 1 tablet (5 mg total) by mouth every 8 (eight) hours as needed for nausea. 03/10/23 03/09/24  [name], MD  mirtazapine (REMERON) 15 MG tablet Take 1 tablet (15 mg total) by mouth at bedtime. 12/27/22   Sarina Ill, DO  mirtazapine (REMERON) 45 MG tablet TAKE 1 TABLET BY MOUTH AT  BEDTIME 04/03/23   [name], MD  ondansetron (ZOFRAN-ODT) 4 MG disintegrating tablet 4mg  ODT q4 hours prn nausea/vomit Patient not taking: Reported on 04/07/2023 03/04/23   Bethann Berkshire, MD  pantoprazole (PROTONIX) 40 MG tablet Take 1 tablet (40 mg total) by mouth daily. Patient taking differently: Take 40 mg by mouth daily as needed (acid reflux). 12/28/22   Sarina Ill, DO  polyethylene glycol (MIRALAX / GLYCOLAX) 17 g packet Take 17 g by mouth daily. 01/22/23   Leatha Gilding, MD  pregabalin (LYRICA) 75 MG capsule Take 1 capsule (75 mg total) by mouth 2 (two) times daily. 12/27/22   Sarina Ill, DO  prochlorperazine (COMPAZINE) 10 MG tablet Take 1 tablet (10 mg total) by mouth every 6 (six) hours as needed for nausea or vomiting. 01/22/23   Leatha Gilding, MD  promethazine (PHENERGAN) 12.5 MG suppository Place 1 suppository (12.5 mg total) rectally every 8 (eight) hours as needed for nausea or vomiting. 03/16/23   McMichael, Saddie Benders, PA-C  risperiDONE (RISPERDAL) 0.5 MG tablet Take 1 tablet (0.5 mg total) by mouth 2 (two) times daily at 8 am and 4 pm. 12/27/22   Sarina Ill, DO  temazepam (RESTORIL) 30 MG  capsule Take 1 capsule (30 mg total) by mouth at bedtime as needed. 02/06/23   Luciano Cutter, MD  traZODone (DESYREL) 100 MG tablet Take 1 tablet (100 mg total) by mouth at bedtime as needed for sleep. 12/27/22   Sarina Ill, DO    Physical Exam: Vitals:   05/22/23 2030 05/22/23 2100 05/22/23 2130 05/22/23 2317  BP: 122/64 128/71 (!) 116/51 (!) 134/57  Pulse: (!) 117 (!) 120 (!) 133 (!) 106  Resp: 20 14 17  (!) 21  Temp:    98.9 F (37.2 C)  TempSrc:    Oral  SpO2: 99% 98% 100%  98%    Constitutional: NAD, calm, comfortable Vitals:   05/22/23 2030 05/22/23 2100 05/22/23 2130 05/22/23 2317  BP: 122/64 128/71 (!) 116/51 (!) 134/57  Pulse: (!) 117 (!) 120 (!) 133 (!) 106  Resp: 20 14 17  (!) 21  Temp:    98.9 F (37.2 C)  TempSrc:    Oral  SpO2: 99% 98% 100% 98%   Eyes: PERRL, lids and conjunctivae normal ENMT: Mucous membranes are moist. Posterior pharynx clear of any exudate or lesions.Normal dentition.  Neck: normal, supple, no masses, no thyromegaly Respiratory: clear to auscultation bilaterally, no wheezing, no crackles. Normal respiratory effort. No accessory muscle use.  Cardiovascular: Regular rate and rhythm, no murmurs / rubs / gallops. No extremity edema. 2+ pedal pulses.  Abdomen: no tenderness, no masses palpated. No hepatosplenomegaly. Bowel sounds positive.  Musculoskeletal: no clubbing / cyanosis. No joint deformity upper and lower extremities. Good ROM, no contractures. Normal muscle tone.  Skin: no rashes, lesions, ulcers. No induration Neurologic: CN 2-12 grossly intact. Sensation intact, Strength 5/5 in all 4.  Psychiatric: poor insight. Alert and oriented x 3.   Labs on Admission: I have personally reviewed following labs and imaging studies  CBC: Recent Labs  Lab 05/22/23 2004  WBC 24.8*  NEUTROABS 18.4*  HGB 13.2  HCT 39.3  MCV 82.7  PLT 473*   Basic Metabolic Panel: Recent Labs  Lab 05/22/23 2004  NA 138  K 4.5  CL 105  CO2  15*  GLUCOSE 110*  BUN 49*  CREATININE 1.90*  CALCIUM 9.2  MG 1.9   GFR: CrCl cannot be calculated (Unknown ideal weight.). Liver Function Tests: Recent Labs  Lab 05/22/23 2004  AST 64*  ALT 36  ALKPHOS 61  BILITOT 1.8*  PROT 7.4  ALBUMIN 3.7   No results for input(s): "LIPASE", "AMYLASE" in the last 168 hours. No results for input(s): "AMMONIA" in the last 168 hours. Coagulation Profile: No results for input(s): "INR", "PROTIME" in the last 168 hours. Cardiac Enzymes: Recent Labs  Lab 05/22/23 2004  CKTOTAL 3,722*   BNP (last 3 results) No results for input(s): "PROBNP" in the last 8760 hours. HbA1C: No results for input(s): "HGBA1C" in the last 72 hours. CBG: Recent Labs  Lab 05/22/23 2002  GLUCAP 128*   Lipid Profile: No results for input(s): "CHOL", "HDL", "LDLCALC", "TRIG", "CHOLHDL", "LDLDIRECT" in the last 72 hours. Thyroid Function Tests: No results for input(s): "TSH", "T4TOTAL", "FREET4", "T3FREE", "THYROIDAB" in the last 72 hours. Anemia Panel: No results for input(s): "VITAMINB12", "FOLATE", "FERRITIN", "TIBC", "IRON", "RETICCTPCT" in the last 72 hours. Urine analysis:    Component Value Date/Time   COLORURINE YELLOW 05/22/2023 1928   APPEARANCEUR TURBID (A) 05/22/2023 1928   LABSPEC 1.025 05/22/2023 1928   PHURINE 5.5 05/22/2023 1928   GLUCOSEU NEGATIVE 05/22/2023 1928   HGBUR LARGE (A) 05/22/2023 1928   BILIRUBINUR MODERATE (A) 05/22/2023 1928   BILIRUBINUR negative 06/30/2018 1005   KETONESUR 40 (A) 05/22/2023 1928   PROTEINUR 100 (A) 05/22/2023 1928   UROBILINOGEN 0.2 06/30/2018 1005   NITRITE NEGATIVE 05/22/2023 1928   LEUKOCYTESUR MODERATE (A) 05/22/2023 1928    Radiological Exams on Admission: CT Head Wo Contrast Result Date: 05/22/2023 CLINICAL DATA:  Polytrauma, blunt EXAM: CT HEAD WITHOUT CONTRAST CT CERVICAL SPINE WITHOUT CONTRAST TECHNIQUE: Multidetector CT imaging of the head and cervical spine was performed following the  standard protocol without intravenous contrast. Multiplanar CT image reconstructions of the cervical spine were also generated. RADIATION DOSE REDUCTION: This exam  was performed according to the departmental dose-optimization program which includes automated exposure control, adjustment of the mA and/or kV according to patient size and/or use of iterative reconstruction technique. COMPARISON:  None Available. FINDINGS: CT HEAD FINDINGS Brain: No evidence of acute infarction, hemorrhage, hydrocephalus, extra-axial collection or mass lesion/mass effect. Vascular: Calcific atherosclerosis.  No hyperdense vessel. Skull: No acute fracture. Sinuses/Orbits: Clear sinuses.  No acute orbital findings. Other: No mastoid effusions. CT CERVICAL SPINE FINDINGS Alignment: No substantial sagittal subluxation. Reversal of the normal cervical lordosis. Skull base and vertebrae: No acute fracture. Vertebral body heights are maintained. Soft tissues and spinal canal: No prevertebral fluid or swelling. No visible canal hematoma. Disc levels: Mild-to-moderate multilevel bony degenerative change. Upper chest: Visualized lung apices are clear. IMPRESSION: No evidence of acute abnormality intracranially or in the cervical spine. Electronically Signed   By: Feliberto Harts M.D.   On: 05/22/2023 22:32   CT Cervical Spine Wo Contrast Result Date: 05/22/2023 CLINICAL DATA:  Polytrauma, blunt EXAM: CT HEAD WITHOUT CONTRAST CT CERVICAL SPINE WITHOUT CONTRAST TECHNIQUE: Multidetector CT imaging of the head and cervical spine was performed following the standard protocol without intravenous contrast. Multiplanar CT image reconstructions of the cervical spine were also generated. RADIATION DOSE REDUCTION: This exam was performed according to the departmental dose-optimization program which includes automated exposure control, adjustment of the mA and/or kV according to patient size and/or use of iterative reconstruction technique. COMPARISON:   None Available. FINDINGS: CT HEAD FINDINGS Brain: No evidence of acute infarction, hemorrhage, hydrocephalus, extra-axial collection or mass lesion/mass effect. Vascular: Calcific atherosclerosis.  No hyperdense vessel. Skull: No acute fracture. Sinuses/Orbits: Clear sinuses.  No acute orbital findings. Other: No mastoid effusions. CT CERVICAL SPINE FINDINGS Alignment: No substantial sagittal subluxation. Reversal of the normal cervical lordosis. Skull base and vertebrae: No acute fracture. Vertebral body heights are maintained. Soft tissues and spinal canal: No prevertebral fluid or swelling. No visible canal hematoma. Disc levels: Mild-to-moderate multilevel bony degenerative change. Upper chest: Visualized lung apices are clear. IMPRESSION: No evidence of acute abnormality intracranially or in the cervical spine. Electronically Signed   By: Feliberto Harts M.D.   On: 05/22/2023 22:32   DG Pelvis Portable Result Date: 05/22/2023 CLINICAL DATA:  Fall, pain. EXAM: PORTABLE PELVIS 1-2 VIEWS COMPARISON:  None Available. FINDINGS: The cortical margins of the bony pelvis are intact. No fracture. Pubic symphysis and sacroiliac joints are congruent. Both femoral heads are well-seated in the respective acetabula. IMPRESSION: No pelvic fracture. Electronically Signed   By: Narda Rutherford M.D.   On: 05/22/2023 22:21   DG Chest Port 1 View Result Date: 05/22/2023 CLINICAL DATA:  Fall. EXAM: PORTABLE CHEST 1 VIEW COMPARISON:  Chest radiograph 01/16/2023 FINDINGS: Stable upper normal heart size.The cardiomediastinal contours are normal. The lungs are clear. Pulmonary vasculature is normal. No consolidation, pleural effusion, or pneumothorax. No acute osseous abnormalities are seen. Incidental cervical ribs. IMPRESSION: No acute chest findings. Electronically Signed   By: Narda Rutherford M.D.   On: 05/22/2023 22:20    EKG: Independently reviewed. See above  Assessment/Plan  Intentional Overdose with continued  somnolence -Hx of Depression and Anxiety  -progressive care  -monitor on tele and continuous pulse ox  - ivfs  -pscyh consult for f/u  in am  -place on one to one  -patient has IVC signed   Sepsis presumed GU in origin  (Tachycardia/ elevated wbc/lactic acid/ ? UTI)  -continue with ctx  - ivfs  -f/u on urine and blood cultures  AKI  -insetting of dehydration/ probable UTI/cocaine use/ sepsis/elevated CPK -hold nephrotoxic medications  -check vbg  - continue with ivfs   Rhabdomyolysis  -continue to monitor CPK  -continue with ivfs   Cocaine abuse  -SW consult   Hypertension  -holding medications currently  -monitor bp , patient currently normatensive   GERD -ppi   DVT prophylaxis: heparin Code Status: full/ default Family Communication: none at bedside Disposition Plan: patient  expected to be admitted greater than 2 midnights  Consults called: Poison control  Admission status: progressive care    Lurline Del MD [facility]   If 7PM-7AM, please contact night-coverage [email or web address] Password [facility]  05/22/2023, 11:51 PM

## 2023-05-22 NOTE — ED Notes (Signed)
 Spoke with pt cousin Asencion Islam and updated her on patient status. Pt stated it was okay to give her information.

## 2023-05-22 NOTE — H&P (Incomplete)
 History and Physical    Natalie Edwards LKG:[id number] DOB: Sep 14, 1965 DOA: 05/22/2023  PCP: [name], MD  Patient coming from: home  I have personally briefly reviewed patient's old medical records in [facility]  Chief Complaint:Found down after attempt at self arm by taking extra doses of sedative home medications.  HPI: Natalie Edwards is a 58 y.o. female with medical history significant of  HTN, GERD, anxiety and depression, Hx of SI with suicide attempts,  hx of gastric ulcers, insomnia, leukocytosis  who presents to ED brought in by family after patient was Found down after attempt at self arm by taking extra doses of sedative home medications.  ED Course:  In ED patient was note to be somnolent on arrival. Patient case discussed with poison control who recommended admission with 24 hour monitoring.  Review of Systems: As per HPI otherwise 10 point review of systems negative.   Past Medical History:  Diagnosis Date  . Allergy   . Anemia   . Diverticulosis   . Gall bladder stones 1993  . Gastric ulcer   . GERD (gastroesophageal reflux disease)   . Hypertension   . Renal mass     Past Surgical History:  Procedure Laterality Date  . ABDOMINAL HYSTERECTOMY    . BIOPSY  09/29/2022   Procedure: BIOPSY;  Surgeon: Napoleon Form, MD;  Location: [facility] ENDOSCOPY;  Service: Gastroenterology;;  . CHOLECYSTECTOMY    . COLONOSCOPY WITH ESOPHAGOGASTRODUODENOSCOPY (EGD)  04/07/2023   [facility]  . ESOPHAGOGASTRODUODENOSCOPY (EGD) WITH PROPOFOL N/A 09/29/2022   Procedure: ESOPHAGOGASTRODUODENOSCOPY (EGD) WITH PROPOFOL;  Surgeon: Napoleon Form, MD;  Location: [facility] ENDOSCOPY;  Service: Gastroenterology;  Laterality: N/A;     reports that she has been smoking cigarettes. She has a 12.5 pack-year smoking history. She has never used smokeless tobacco. She reports current alcohol use. She reports that she does not use drugs.  Allergies  Allergen Reactions  . Hydroxyzine  Other (See Comments)    Palpitations and jitteriness   . Ambien [Zolpidem Tartrate] Other (See Comments)    Jitteriness, nervousness, abdominal pain  . Ibuprofen     Family History  Problem Relation Age of Onset  . Cancer Maternal Uncle        Lung  . Cancer Maternal Uncle        Lung  . Cancer Maternal Uncle        Lung  . Headache Neg Hx        "I don't think so"  . Migraines Neg Hx        "I don't think so"   *** Prior to Admission medications   Medication Sig Start Date End Date Taking? Authorizing Provider  acetaminophen (TYLENOL) 500 MG tablet Take 500 mg by mouth every 6 (six) hours as needed for moderate pain (pain score 4-6).    [provider]  amLODipine (NORVASC) 5 MG tablet Take 1 tablet (5 mg total) by mouth daily. 12/28/22   Herrick, Richard Edward, DO  BELSOMRA 20 MG TABS Take 1 tablet (20 mg total) by mouth at bedtime. 02/27/23   Luciano Cutter, MD  dicyclomine (BENTYL) 20 MG tablet Take 1 tablet (20 mg total) by mouth 3 (three) times daily before meals. 03/10/23   [name], MD  FLUoxetine (PROZAC) 20 MG capsule Take 1 capsule (20 mg total) by mouth daily. 12/28/22   Sarina Ill, DO  HYDROmorphone (DILAUDID) 4 MG tablet Take 1 tablet (4 mg total) by mouth every  6 (six) hours as needed for severe pain (pain score 7-10). Patient not taking: Reported on 03/16/2023 03/04/23   Bethann Berkshire, MD  hydrOXYzine (ATARAX) 50 MG tablet Take 50 mg by mouth 2 (two) times daily as needed for anxiety (sleep).    [provider]  metoCLOPramide (REGLAN) 5 MG tablet Take 1 tablet (5 mg total) by mouth every 8 (eight) hours as needed for nausea. 03/10/23 03/09/24  [name], MD  mirtazapine (REMERON) 15 MG tablet Take 1 tablet (15 mg total) by mouth at bedtime. 12/27/22   Sarina Ill, DO  mirtazapine (REMERON) 45 MG tablet TAKE 1 TABLET BY MOUTH AT  BEDTIME 04/03/23   [name], MD  ondansetron (ZOFRAN-ODT) 4 MG  disintegrating tablet 4mg  ODT q4 hours prn nausea/vomit Patient not taking: Reported on 04/07/2023 03/04/23   Bethann Berkshire, MD  pantoprazole (PROTONIX) 40 MG tablet Take 1 tablet (40 mg total) by mouth daily. Patient taking differently: Take 40 mg by mouth daily as needed (acid reflux). 12/28/22   Sarina Ill, DO  polyethylene glycol (MIRALAX / GLYCOLAX) 17 g packet Take 17 g by mouth daily. 01/22/23   Leatha Gilding, MD  pregabalin (LYRICA) 75 MG capsule Take 1 capsule (75 mg total) by mouth 2 (two) times daily. 12/27/22   Sarina Ill, DO  prochlorperazine (COMPAZINE) 10 MG tablet Take 1 tablet (10 mg total) by mouth every 6 (six) hours as needed for nausea or vomiting. 01/22/23   Leatha Gilding, MD  promethazine (PHENERGAN) 12.5 MG suppository Place 1 suppository (12.5 mg total) rectally every 8 (eight) hours as needed for nausea or vomiting. 03/16/23   McMichael, Saddie Benders, PA-C  risperiDONE (RISPERDAL) 0.5 MG tablet Take 1 tablet (0.5 mg total) by mouth 2 (two) times daily at 8 am and 4 pm. 12/27/22   Sarina Ill, DO  temazepam (RESTORIL) 30 MG capsule Take 1 capsule (30 mg total) by mouth at bedtime as needed. 02/06/23   Luciano Cutter, MD  traZODone (DESYREL) 100 MG tablet Take 1 tablet (100 mg total) by mouth at bedtime as needed for sleep. 12/27/22   Sarina Ill, DO    Physical Exam: Vitals:   05/22/23 2030 05/22/23 2100 05/22/23 2130 05/22/23 2317  BP: 122/64 128/71 (!) 116/51 (!) 134/57  Pulse: (!) 117 (!) 120 (!) 133 (!) 106  Resp: 20 14 17  (!) 21  Temp:    98.9 F (37.2 C)  TempSrc:    Oral  SpO2: 99% 98% 100% 98%    Constitutional: NAD, calm, comfortable Vitals:   05/22/23 2030 05/22/23 2100 05/22/23 2130 05/22/23 2317  BP: 122/64 128/71 (!) 116/51 (!) 134/57  Pulse: (!) 117 (!) 120 (!) 133 (!) 106  Resp: 20 14 17  (!) 21  Temp:    98.9 F (37.2 C)  TempSrc:    Oral  SpO2: 99% 98% 100% 98%   Eyes: PERRL, lids and  conjunctivae normal ENMT: Mucous membranes are moist. Posterior pharynx clear of any exudate or lesions.Normal dentition.  Neck: normal, supple, no masses, no thyromegaly Respiratory: clear to auscultation bilaterally, no wheezing, no crackles. Normal respiratory effort. No accessory muscle use.  Cardiovascular: Regular rate and rhythm, no murmurs / rubs / gallops. No extremity edema. 2+ pedal pulses. No carotid bruits.  Abdomen: no tenderness, no masses palpated. No hepatosplenomegaly. Bowel sounds positive.  Musculoskeletal: no clubbing / cyanosis. No joint deformity upper and lower extremities. Good ROM, no contractures. Normal muscle tone.  Skin:  no rashes, lesions, ulcers. No induration Neurologic: CN 2-12 grossly intact. Sensation intact, DTR normal. Strength 5/5 in all 4.  Psychiatric: Normal judgment and insight. Alert and oriented x 3. Normal mood.    Labs on Admission: I have personally reviewed following labs and imaging studies  CBC: Recent Labs  Lab 05/22/23 2004  WBC 24.8*  NEUTROABS 18.4*  HGB 13.2  HCT 39.3  MCV 82.7  PLT 473*   Basic Metabolic Panel: Recent Labs  Lab 05/22/23 2004  NA 138  K 4.5  CL 105  CO2 15*  GLUCOSE 110*  BUN 49*  CREATININE 1.90*  CALCIUM 9.2  MG 1.9   GFR: CrCl cannot be calculated (Unknown ideal weight.). Liver Function Tests: Recent Labs  Lab 05/22/23 2004  AST 64*  ALT 36  ALKPHOS 61  BILITOT 1.8*  PROT 7.4  ALBUMIN 3.7   No results for input(s): "LIPASE", "AMYLASE" in the last 168 hours. No results for input(s): "AMMONIA" in the last 168 hours. Coagulation Profile: No results for input(s): "INR", "PROTIME" in the last 168 hours. Cardiac Enzymes: Recent Labs  Lab 05/22/23 2004  CKTOTAL 3,722*   BNP (last 3 results) No results for input(s): "PROBNP" in the last 8760 hours. HbA1C: No results for input(s): "HGBA1C" in the last 72 hours. CBG: Recent Labs  Lab 05/22/23 2002  GLUCAP 128*   Lipid Profile: No  results for input(s): "CHOL", "HDL", "LDLCALC", "TRIG", "CHOLHDL", "LDLDIRECT" in the last 72 hours. Thyroid Function Tests: No results for input(s): "TSH", "T4TOTAL", "FREET4", "T3FREE", "THYROIDAB" in the last 72 hours. Anemia Panel: No results for input(s): "VITAMINB12", "FOLATE", "FERRITIN", "TIBC", "IRON", "RETICCTPCT" in the last 72 hours. Urine analysis:    Component Value Date/Time   COLORURINE YELLOW 05/22/2023 1928   APPEARANCEUR TURBID (A) 05/22/2023 1928   LABSPEC 1.025 05/22/2023 1928   PHURINE 5.5 05/22/2023 1928   GLUCOSEU NEGATIVE 05/22/2023 1928   HGBUR LARGE (A) 05/22/2023 1928   BILIRUBINUR MODERATE (A) 05/22/2023 1928   BILIRUBINUR negative 06/30/2018 1005   KETONESUR 40 (A) 05/22/2023 1928   PROTEINUR 100 (A) 05/22/2023 1928   UROBILINOGEN 0.2 06/30/2018 1005   NITRITE NEGATIVE 05/22/2023 1928   LEUKOCYTESUR MODERATE (A) 05/22/2023 1928    Radiological Exams on Admission: CT Head Wo Contrast Result Date: 05/22/2023 CLINICAL DATA:  Polytrauma, blunt EXAM: CT HEAD WITHOUT CONTRAST CT CERVICAL SPINE WITHOUT CONTRAST TECHNIQUE: Multidetector CT imaging of the head and cervical spine was performed following the standard protocol without intravenous contrast. Multiplanar CT image reconstructions of the cervical spine were also generated. RADIATION DOSE REDUCTION: This exam was performed according to the departmental dose-optimization program which includes automated exposure control, adjustment of the mA and/or kV according to patient size and/or use of iterative reconstruction technique. COMPARISON:  None Available. FINDINGS: CT HEAD FINDINGS Brain: No evidence of acute infarction, hemorrhage, hydrocephalus, extra-axial collection or mass lesion/mass effect. Vascular: Calcific atherosclerosis.  No hyperdense vessel. Skull: No acute fracture. Sinuses/Orbits: Clear sinuses.  No acute orbital findings. Other: No mastoid effusions. CT CERVICAL SPINE FINDINGS Alignment: No  substantial sagittal subluxation. Reversal of the normal cervical lordosis. Skull base and vertebrae: No acute fracture. Vertebral body heights are maintained. Soft tissues and spinal canal: No prevertebral fluid or swelling. No visible canal hematoma. Disc levels: Mild-to-moderate multilevel bony degenerative change. Upper chest: Visualized lung apices are clear. IMPRESSION: No evidence of acute abnormality intracranially or in the cervical spine. Electronically Signed   By: Feliberto Harts M.D.   On: 05/22/2023 22:32  CT Cervical Spine Wo Contrast Result Date: 05/22/2023 CLINICAL DATA:  Polytrauma, blunt EXAM: CT HEAD WITHOUT CONTRAST CT CERVICAL SPINE WITHOUT CONTRAST TECHNIQUE: Multidetector CT imaging of the head and cervical spine was performed following the standard protocol without intravenous contrast. Multiplanar CT image reconstructions of the cervical spine were also generated. RADIATION DOSE REDUCTION: This exam was performed according to the departmental dose-optimization program which includes automated exposure control, adjustment of the mA and/or kV according to patient size and/or use of iterative reconstruction technique. COMPARISON:  None Available. FINDINGS: CT HEAD FINDINGS Brain: No evidence of acute infarction, hemorrhage, hydrocephalus, extra-axial collection or mass lesion/mass effect. Vascular: Calcific atherosclerosis.  No hyperdense vessel. Skull: No acute fracture. Sinuses/Orbits: Clear sinuses.  No acute orbital findings. Other: No mastoid effusions. CT CERVICAL SPINE FINDINGS Alignment: No substantial sagittal subluxation. Reversal of the normal cervical lordosis. Skull base and vertebrae: No acute fracture. Vertebral body heights are maintained. Soft tissues and spinal canal: No prevertebral fluid or swelling. No visible canal hematoma. Disc levels: Mild-to-moderate multilevel bony degenerative change. Upper chest: Visualized lung apices are clear. IMPRESSION: No evidence of  acute abnormality intracranially or in the cervical spine. Electronically Signed   By: Feliberto Harts M.D.   On: 05/22/2023 22:32   DG Pelvis Portable Result Date: 05/22/2023 CLINICAL DATA:  Fall, pain. EXAM: PORTABLE PELVIS 1-2 VIEWS COMPARISON:  None Available. FINDINGS: The cortical margins of the bony pelvis are intact. No fracture. Pubic symphysis and sacroiliac joints are congruent. Both femoral heads are well-seated in the respective acetabula. IMPRESSION: No pelvic fracture. Electronically Signed   By: Narda Rutherford M.D.   On: 05/22/2023 22:21   DG Chest Port 1 View Result Date: 05/22/2023 CLINICAL DATA:  Fall. EXAM: PORTABLE CHEST 1 VIEW COMPARISON:  Chest radiograph 01/16/2023 FINDINGS: Stable upper normal heart size.The cardiomediastinal contours are normal. The lungs are clear. Pulmonary vasculature is normal. No consolidation, pleural effusion, or pneumothorax. No acute osseous abnormalities are seen. Incidental cervical ribs. IMPRESSION: No acute chest findings. Electronically Signed   By: Narda Rutherford M.D.   On: 05/22/2023 22:20    EKG: Independently reviewed. ***  Assessment/Plan Active Problems:   * No active hospital problems. *   ***  DVT prophylaxis: *** (Lovenox/Heparin/SCD's/anticoagulated/None (if comfort care) Code Status: *** (Full/Partial (specify details) Family Communication: *** (Specify name, relationship. Do not write "discussed with patient". Specify tel # if discussed over the phone) Disposition Plan: *** (specify when and where you expect patient to be discharged) Consults called: *** (with names) Admission status: *** (inpatient / obs / tele / medical floor / SDU)   Lurline Del MD [facility] Pager [phone]  If 7PM-7AM, please contact night-coverage [email or web address] Password [facility]  05/22/2023, 11:51 PM

## 2023-05-22 NOTE — ED Notes (Signed)
Assumed pt care from Sara RN

## 2023-05-22 NOTE — ED Notes (Signed)
 Patty from poison control called and update was given. She stated to redraw a CMP after 2nd bag of fluids are finished and call her back with results of that and second EKG at 0244 and let her know those results as well

## 2023-05-22 NOTE — ED Triage Notes (Signed)
 Pt comes in POV from home after being found on floor by family member for an unknown amount of time in her own urine. Per pt took too much of an unknown medication on purpose. Pt denies SI but family member states hx of self harm. VSS in triage, BP 158/77, HR 133. Pt is alert and oriented but somnolent.

## 2023-05-22 NOTE — ED Provider Notes (Addendum)
 [place] EMERGENCY DEPARTMENT AT [facility] Provider Note   CSN: [id number] Arrival date & time: 05/22/23  1831     History  Chief Complaint  Patient presents with   Drug Overdose    Natalie Edwards is a 58 y.o. female.  Patient here after being found in the floor by family from unknown amount of time in her own urine.  Sounds like she took too much of an unknown medication that she states on purpose.  She is denying SI but family states that she has a history of self-harm anxiety depression.  When I talked her further she cannot really be specific about which pills she took but it does sound like she tried to hurt herself today.  She has a history of hypertension.  She has multiple sedating medications including benzodiazepine.  She is not hurting anywhere.  She lives with a cousin.  The history is provided by the patient and a relative.       Home Medications Prior to Admission medications   Medication Sig Start Date End Date Taking? Authorizing Provider  acetaminophen (TYLENOL) 500 MG tablet Take 500 mg by mouth every 6 (six) hours as needed for moderate pain (pain score 4-6).    [provider]  amLODipine (NORVASC) 5 MG tablet Take 1 tablet (5 mg total) by mouth daily. 12/28/22   Herrick, Richard Edward, DO  BELSOMRA 20 MG TABS Take 1 tablet (20 mg total) by mouth at bedtime. 02/27/23   Luciano Cutter, MD  dicyclomine (BENTYL) 20 MG tablet Take 1 tablet (20 mg total) by mouth 3 (three) times daily before meals. 03/10/23   [name], MD  FLUoxetine (PROZAC) 20 MG capsule Take 1 capsule (20 mg total) by mouth daily. 12/28/22   Sarina Ill, DO  HYDROmorphone (DILAUDID) 4 MG tablet Take 1 tablet (4 mg total) by mouth every 6 (six) hours as needed for severe pain (pain score 7-10). Patient not taking: Reported on 03/16/2023 03/04/23   Bethann Berkshire, MD  hydrOXYzine (ATARAX) 50 MG tablet Take 50 mg by mouth 2 (two) times daily as needed for  anxiety (sleep).    [provider]  metoCLOPramide (REGLAN) 5 MG tablet Take 1 tablet (5 mg total) by mouth every 8 (eight) hours as needed for nausea. 03/10/23 03/09/24  [name], MD  mirtazapine (REMERON) 15 MG tablet Take 1 tablet (15 mg total) by mouth at bedtime. 12/27/22   Sarina Ill, DO  mirtazapine (REMERON) 45 MG tablet TAKE 1 TABLET BY MOUTH AT  BEDTIME 04/03/23   [name], MD  ondansetron (ZOFRAN-ODT) 4 MG disintegrating tablet 4mg  ODT q4 hours prn nausea/vomit Patient not taking: Reported on 04/07/2023 03/04/23   Bethann Berkshire, MD  pantoprazole (PROTONIX) 40 MG tablet Take 1 tablet (40 mg total) by mouth daily. Patient taking differently: Take 40 mg by mouth daily as needed (acid reflux). 12/28/22   Sarina Ill, DO  polyethylene glycol (MIRALAX / GLYCOLAX) 17 g packet Take 17 g by mouth daily. 01/22/23   Leatha Gilding, MD  pregabalin (LYRICA) 75 MG capsule Take 1 capsule (75 mg total) by mouth 2 (two) times daily. 12/27/22   Sarina Ill, DO  prochlorperazine (COMPAZINE) 10 MG tablet Take 1 tablet (10 mg total) by mouth every 6 (six) hours as needed for nausea or vomiting. 01/22/23   Leatha Gilding, MD  promethazine (PHENERGAN) 12.5 MG suppository Place 1 suppository (12.5 mg total) rectally every 8 (eight) hours  as needed for nausea or vomiting. 03/16/23   McMichael, Saddie Benders, PA-C  risperiDONE (RISPERDAL) 0.5 MG tablet Take 1 tablet (0.5 mg total) by mouth 2 (two) times daily at 8 am and 4 pm. 12/27/22   Sarina Ill, DO  temazepam (RESTORIL) 30 MG capsule Take 1 capsule (30 mg total) by mouth at bedtime as needed. 02/06/23   Luciano Cutter, MD  traZODone (DESYREL) 100 MG tablet Take 1 tablet (100 mg total) by mouth at bedtime as needed for sleep. 12/27/22   Sarina Ill, DO      Allergies    Hydroxyzine, Ambien [zolpidem tartrate], and Ibuprofen    Review of Systems   Review of  Systems  Physical Exam Updated Vital Signs BP (!) 116/51   Pulse (!) 133   Temp (!) 97 F (36.1 C)   Resp 17   SpO2 100%  Physical Exam Vitals and nursing note reviewed.  Constitutional:      Appearance: She is well-developed.  HENT:     Head: Normocephalic and atraumatic.     Nose: Nose normal.     Mouth/Throat:     Mouth: Mucous membranes are moist.  Eyes:     Extraocular Movements: Extraocular movements intact.     Conjunctiva/sclera: Conjunctivae normal.     Pupils: Pupils are equal, round, and reactive to light.  Cardiovascular:     Rate and Rhythm: Regular rhythm. Tachycardia present.     Pulses: Normal pulses.     Heart sounds: Normal heart sounds. No murmur heard. Pulmonary:     Effort: Pulmonary effort is normal. No respiratory distress.     Breath sounds: Normal breath sounds.  Abdominal:     Palpations: Abdomen is soft.     Tenderness: There is no abdominal tenderness.  Musculoskeletal:        General: No swelling.     Cervical back: Normal range of motion and neck supple.  Skin:    General: Skin is warm and dry.     Capillary Refill: Capillary refill takes less than 2 seconds.  Neurological:     Mental Status: She is alert.     Comments: Patient is somnolent generally weak but neurologically she appears to be intact  Psychiatric:     Comments: She is depressed she is hesitant to really answer questions about self-harm and suicidal ideation     ED Results / Procedures / Treatments   Labs (all labs ordered are listed, but only abnormal results are displayed) Labs Reviewed  COMPREHENSIVE METABOLIC PANEL - Abnormal; Notable for the following components:      Result Value   CO2 15 (*)    Glucose, Bld 110 (*)    BUN 49 (*)    Creatinine, Ser 1.90 (*)    AST 64 (*)    Total Bilirubin 1.8 (*)    GFR, Estimated 30 (*)    Anion gap 18 (*)    All other components within normal limits  RAPID URINE DRUG SCREEN, HOSP PERFORMED - Abnormal; Notable for the  following components:   Cocaine POSITIVE (*)    All other components within normal limits  CBC WITH DIFFERENTIAL/PLATELET - Abnormal; Notable for the following components:   WBC 24.8 (*)    RDW 17.5 (*)    Platelets 473 (*)    Neutro Abs 18.4 (*)    Monocytes Absolute 2.1 (*)    Abs Immature Granulocytes 0.20 (*)    All other components within normal limits  SALICYLATE LEVEL - Abnormal; Notable for the following components:   Salicylate Lvl <7.0 (*)    All other components within normal limits  ACETAMINOPHEN LEVEL - Abnormal; Notable for the following components:   Acetaminophen (Tylenol), Serum <10 (*)    All other components within normal limits  CK - Abnormal; Notable for the following components:   Total CK 3,722 (*)    All other components within normal limits  URINALYSIS, ROUTINE W REFLEX MICROSCOPIC - Abnormal; Notable for the following components:   APPearance TURBID (*)    Hgb urine dipstick LARGE (*)    Bilirubin Urine MODERATE (*)    Ketones, ur 40 (*)    Protein, ur 100 (*)    Leukocytes,Ua MODERATE (*)    All other components within normal limits  URINALYSIS, MICROSCOPIC (REFLEX) - Abnormal; Notable for the following components:   Bacteria, UA FEW (*)    All other components within normal limits  CBG MONITORING, ED - Abnormal; Notable for the following components:   Glucose-Capillary 128 (*)    All other components within normal limits  I-STAT CG4 LACTIC ACID, ED - Abnormal; Notable for the following components:   Lactic Acid, Venous 2.4 (*)    All other components within normal limits  URINE CULTURE  CULTURE, BLOOD (ROUTINE X 2)  CULTURE, BLOOD (ROUTINE X 2)  ETHANOL  MAGNESIUM  ACETAMINOPHEN LEVEL    EKG EKG Interpretation Date/Time:  Friday May 22 2023 20:44:21 EST Ventricular Rate:  110 PR Interval:  134 QRS Duration:  84 QT Interval:  333 QTC Calculation: 451 R Axis:   75  Text Interpretation: Sinus tachycardia Probable left atrial  enlargement Confirmed by Virgina Norfolk [phone]) on 05/22/2023 8:47:14 PM  Radiology CT Head Wo Contrast Result Date: 05/22/2023 CLINICAL DATA:  Polytrauma, blunt EXAM: CT HEAD WITHOUT CONTRAST CT CERVICAL SPINE WITHOUT CONTRAST TECHNIQUE: Multidetector CT imaging of the head and cervical spine was performed following the standard protocol without intravenous contrast. Multiplanar CT image reconstructions of the cervical spine were also generated. RADIATION DOSE REDUCTION: This exam was performed according to the departmental dose-optimization program which includes automated exposure control, adjustment of the mA and/or kV according to patient size and/or use of iterative reconstruction technique. COMPARISON:  None Available. FINDINGS: CT HEAD FINDINGS Brain: No evidence of acute infarction, hemorrhage, hydrocephalus, extra-axial collection or mass lesion/mass effect. Vascular: Calcific atherosclerosis.  No hyperdense vessel. Skull: No acute fracture. Sinuses/Orbits: Clear sinuses.  No acute orbital findings. Other: No mastoid effusions. CT CERVICAL SPINE FINDINGS Alignment: No substantial sagittal subluxation. Reversal of the normal cervical lordosis. Skull base and vertebrae: No acute fracture. Vertebral body heights are maintained. Soft tissues and spinal canal: No prevertebral fluid or swelling. No visible canal hematoma. Disc levels: Mild-to-moderate multilevel bony degenerative change. Upper chest: Visualized lung apices are clear. IMPRESSION: No evidence of acute abnormality intracranially or in the cervical spine. Electronically Signed   By: Feliberto Harts M.D.   On: 05/22/2023 22:32   CT Cervical Spine Wo Contrast Result Date: 05/22/2023 CLINICAL DATA:  Polytrauma, blunt EXAM: CT HEAD WITHOUT CONTRAST CT CERVICAL SPINE WITHOUT CONTRAST TECHNIQUE: Multidetector CT imaging of the head and cervical spine was performed following the standard protocol without intravenous contrast. Multiplanar CT image  reconstructions of the cervical spine were also generated. RADIATION DOSE REDUCTION: This exam was performed according to the departmental dose-optimization program which includes automated exposure control, adjustment of the mA and/or kV according to patient size and/or use of iterative reconstruction technique. COMPARISON:  None Available. FINDINGS: CT HEAD FINDINGS Brain: No evidence of acute infarction, hemorrhage, hydrocephalus, extra-axial collection or mass lesion/mass effect. Vascular: Calcific atherosclerosis.  No hyperdense vessel. Skull: No acute fracture. Sinuses/Orbits: Clear sinuses.  No acute orbital findings. Other: No mastoid effusions. CT CERVICAL SPINE FINDINGS Alignment: No substantial sagittal subluxation. Reversal of the normal cervical lordosis. Skull base and vertebrae: No acute fracture. Vertebral body heights are maintained. Soft tissues and spinal canal: No prevertebral fluid or swelling. No visible canal hematoma. Disc levels: Mild-to-moderate multilevel bony degenerative change. Upper chest: Visualized lung apices are clear. IMPRESSION: No evidence of acute abnormality intracranially or in the cervical spine. Electronically Signed   By: Feliberto Harts M.D.   On: 05/22/2023 22:32   DG Pelvis Portable Result Date: 05/22/2023 CLINICAL DATA:  Fall, pain. EXAM: PORTABLE PELVIS 1-2 VIEWS COMPARISON:  None Available. FINDINGS: The cortical margins of the bony pelvis are intact. No fracture. Pubic symphysis and sacroiliac joints are congruent. Both femoral heads are well-seated in the respective acetabula. IMPRESSION: No pelvic fracture. Electronically Signed   By: Narda Rutherford M.D.   On: 05/22/2023 22:21   DG Chest Port 1 View Result Date: 05/22/2023 CLINICAL DATA:  Fall. EXAM: PORTABLE CHEST 1 VIEW COMPARISON:  Chest radiograph 01/16/2023 FINDINGS: Stable upper normal heart size.The cardiomediastinal contours are normal. The lungs are clear. Pulmonary vasculature is normal. No  consolidation, pleural effusion, or pneumothorax. No acute osseous abnormalities are seen. Incidental cervical ribs. IMPRESSION: No acute chest findings. Electronically Signed   By: Narda Rutherford M.D.   On: 05/22/2023 22:20    Procedures .Critical Care  Performed by: Virgina Norfolk, DO Authorized by: Virgina Norfolk, DO   Critical care provider statement:    Critical care time (minutes):  35   Critical care was necessary to treat or prevent imminent or life-threatening deterioration of the following conditions:  Sepsis   Critical care was time spent personally by me on the following activities:  Blood draw for specimens, development of treatment plan with patient or surrogate, discussions with primary provider, evaluation of patient's response to treatment, examination of patient, obtaining history from patient or surrogate, ordering and performing treatments and interventions, ordering and review of laboratory studies, ordering and review of radiographic studies, pulse oximetry, re-evaluation of patient's condition and review of old charts   Care discussed with: admitting provider       Medications Ordered in ED Medications  cefTRIAXone (ROCEPHIN) 2 g in sodium chloride 0.9 % 100 mL IVPB (2 g Intravenous New Bag/Given 05/22/23 2300)  sodium chloride 0.9 % bolus 1,000 mL (0 mLs Intravenous Stopped 05/22/23 2228)  sodium chloride 0.9 % bolus 1,000 mL (1,000 mLs Intravenous New Bag/Given 05/22/23 2228)    ED Course/ Medical Decision Making/ A&P                                 Medical Decision Making Amount and/or Complexity of Data Reviewed Labs: ordered. Radiology: ordered.  Risk Decision regarding hospitalization.   Natalie Edwards is here for after what we suspect was an intent to self-harm.  Family found her on the floor for unknown amount of time in her own urine.  Patient states that she is taking medications and attempt for self-harm but she denies SI.  She does not seem very  reliable with her history at this time.  Family states she has a history of the same.  She is somnolent but she  is alert and oriented neurologically appears to be intact.  She is tachycardic but vital signs are otherwise unremarkable.  Will get medical clearance labs a Tylenol level salicylate EKG head CT neck CT will check CK urinalysis.  I talked to poison control they recommend probably 24-hour telemetry given we do not know what medication she is taking.  I have IVC her.  Will try to medically clear from the ED if were able to anticipate psychiatric admission.  Per my review interpretation of labs patient does have an AKI with a creatinine of 1.9.  Mild anion gap CO2 of 15 which I suspect is from dehydration.  Alcohol was normal.  Salicylate level unremarkable.  Tylenol level unremarkable.  Mild elevation of CK.  She does have a leukocytosis of 24.  Overall she does not have a fever but I do think urinary tract infection could be playing a role as well as she was found in her own urine not sure how long she is on the floor for.  Her CT imaging of her head appears unremarkable.  Chest x-ray is unremarkable.  Awaiting radiology to confirm this.  She still tachycardic.  Going to give her an additional liter of fluid as I do think she is pretty significantly dehydrated.  Poison control did recommend 24-hour medical clearance and likely will admit to the hospital given metabolic derangements.  I do not think she meets sepsis criteria at this time as this could all be a stress reaction as well.  Waiting to see if we can confirm urinary tract infection.  We are performing In-N-Out catheterization to get urine sample out now.  Overall lactic acid is elevated 2.4.  This is after she got a fluid bolus.  I do suspect likely UTI could be stress response but given tachycardia elevated white count we will empirically start antibiotics and cover for sepsis.   U/a concerning for UTI. Will admit to hospitalist for further  care.  IVC has been filled as well.  CT images were unremarkable as well as chest x-ray and pelvic x-ray per radiology report.  This chart was dictated using voice recognition software.  Despite best efforts to proofread,  errors can occur which can change the documentation meaning.       Final Clinical Impression(s) / ED Diagnoses Final diagnoses:  AKI (acute kidney injury) (HCC)  Intentional drug overdose, initial encounter (HCC)  Sepsis, due to unspecified organism, unspecified whether acute organ dysfunction present [facility])  Acute cystitis without hematuria    Rx / DC Orders ED Discharge Orders     None         Virgina Norfolk, DO 05/22/23 2243    Virgina Norfolk, DO 05/22/23 2308

## 2023-05-23 DIAGNOSIS — Z87442 Personal history of urinary calculi: Secondary | ICD-10-CM | POA: Diagnosis not present

## 2023-05-23 DIAGNOSIS — I10 Essential (primary) hypertension: Secondary | ICD-10-CM | POA: Diagnosis present

## 2023-05-23 DIAGNOSIS — Z79899 Other long term (current) drug therapy: Secondary | ICD-10-CM | POA: Diagnosis not present

## 2023-05-23 DIAGNOSIS — F17210 Nicotine dependence, cigarettes, uncomplicated: Secondary | ICD-10-CM | POA: Diagnosis present

## 2023-05-23 DIAGNOSIS — Z90710 Acquired absence of both cervix and uterus: Secondary | ICD-10-CM | POA: Diagnosis not present

## 2023-05-23 DIAGNOSIS — N3000 Acute cystitis without hematuria: Secondary | ICD-10-CM | POA: Diagnosis present

## 2023-05-23 DIAGNOSIS — G928 Other toxic encephalopathy: Secondary | ICD-10-CM | POA: Diagnosis present

## 2023-05-23 DIAGNOSIS — T6592XS Toxic effect of unspecified substance, intentional self-harm, sequela: Secondary | ICD-10-CM

## 2023-05-23 DIAGNOSIS — A419 Sepsis, unspecified organism: Secondary | ICD-10-CM | POA: Diagnosis present

## 2023-05-23 DIAGNOSIS — E860 Dehydration: Secondary | ICD-10-CM | POA: Diagnosis present

## 2023-05-23 DIAGNOSIS — F410 Panic disorder [episodic paroxysmal anxiety] without agoraphobia: Secondary | ICD-10-CM | POA: Diagnosis present

## 2023-05-23 DIAGNOSIS — K219 Gastro-esophageal reflux disease without esophagitis: Secondary | ICD-10-CM | POA: Diagnosis present

## 2023-05-23 DIAGNOSIS — T426X2D Poisoning by other antiepileptic and sedative-hypnotic drugs, intentional self-harm, subsequent encounter: Secondary | ICD-10-CM | POA: Diagnosis not present

## 2023-05-23 DIAGNOSIS — Z886 Allergy status to analgesic agent status: Secondary | ICD-10-CM | POA: Diagnosis not present

## 2023-05-23 DIAGNOSIS — T50902A Poisoning by unspecified drugs, medicaments and biological substances, intentional self-harm, initial encounter: Secondary | ICD-10-CM | POA: Diagnosis not present

## 2023-05-23 DIAGNOSIS — Z9152 Personal history of nonsuicidal self-harm: Secondary | ICD-10-CM | POA: Diagnosis not present

## 2023-05-23 DIAGNOSIS — F1910 Other psychoactive substance abuse, uncomplicated: Secondary | ICD-10-CM | POA: Diagnosis not present

## 2023-05-23 DIAGNOSIS — Z5900 Homelessness unspecified: Secondary | ICD-10-CM | POA: Diagnosis not present

## 2023-05-23 DIAGNOSIS — M6282 Rhabdomyolysis: Secondary | ICD-10-CM | POA: Diagnosis present

## 2023-05-23 DIAGNOSIS — I4891 Unspecified atrial fibrillation: Secondary | ICD-10-CM | POA: Diagnosis present

## 2023-05-23 DIAGNOSIS — Z8711 Personal history of peptic ulcer disease: Secondary | ICD-10-CM | POA: Diagnosis not present

## 2023-05-23 DIAGNOSIS — F411 Generalized anxiety disorder: Secondary | ICD-10-CM | POA: Diagnosis present

## 2023-05-23 DIAGNOSIS — F1414 Cocaine abuse with cocaine-induced mood disorder: Secondary | ICD-10-CM | POA: Diagnosis present

## 2023-05-23 DIAGNOSIS — N179 Acute kidney failure, unspecified: Secondary | ICD-10-CM | POA: Diagnosis present

## 2023-05-23 DIAGNOSIS — Z560 Unemployment, unspecified: Secondary | ICD-10-CM | POA: Diagnosis not present

## 2023-05-23 DIAGNOSIS — Z5941 Food insecurity: Secondary | ICD-10-CM | POA: Diagnosis not present

## 2023-05-23 DIAGNOSIS — N390 Urinary tract infection, site not specified: Secondary | ICD-10-CM | POA: Diagnosis present

## 2023-05-23 DIAGNOSIS — F1410 Cocaine abuse, uncomplicated: Secondary | ICD-10-CM | POA: Diagnosis present

## 2023-05-23 DIAGNOSIS — Z604 Social exclusion and rejection: Secondary | ICD-10-CM | POA: Diagnosis present

## 2023-05-23 DIAGNOSIS — Z634 Disappearance and death of family member: Secondary | ICD-10-CM | POA: Diagnosis not present

## 2023-05-23 DIAGNOSIS — Z5982 Transportation insecurity: Secondary | ICD-10-CM | POA: Diagnosis not present

## 2023-05-23 DIAGNOSIS — R6510 Systemic inflammatory response syndrome (SIRS) of non-infectious origin without acute organ dysfunction: Secondary | ICD-10-CM | POA: Insufficient documentation

## 2023-05-23 DIAGNOSIS — B961 Klebsiella pneumoniae [K. pneumoniae] as the cause of diseases classified elsewhere: Secondary | ICD-10-CM | POA: Diagnosis present

## 2023-05-23 DIAGNOSIS — F419 Anxiety disorder, unspecified: Secondary | ICD-10-CM | POA: Diagnosis present

## 2023-05-23 DIAGNOSIS — Z9151 Personal history of suicidal behavior: Secondary | ICD-10-CM | POA: Diagnosis not present

## 2023-05-23 DIAGNOSIS — M7989 Other specified soft tissue disorders: Secondary | ICD-10-CM | POA: Diagnosis present

## 2023-05-23 DIAGNOSIS — I517 Cardiomegaly: Secondary | ICD-10-CM | POA: Diagnosis not present

## 2023-05-23 DIAGNOSIS — T4272XA Poisoning by unspecified antiepileptic and sedative-hypnotic drugs, intentional self-harm, initial encounter: Secondary | ICD-10-CM | POA: Diagnosis present

## 2023-05-23 DIAGNOSIS — Z888 Allergy status to other drugs, medicaments and biological substances status: Secondary | ICD-10-CM | POA: Diagnosis not present

## 2023-05-23 DIAGNOSIS — Z87891 Personal history of nicotine dependence: Secondary | ICD-10-CM | POA: Diagnosis not present

## 2023-05-23 DIAGNOSIS — G4700 Insomnia, unspecified: Secondary | ICD-10-CM | POA: Diagnosis present

## 2023-05-23 DIAGNOSIS — F332 Major depressive disorder, recurrent severe without psychotic features: Secondary | ICD-10-CM | POA: Diagnosis present

## 2023-05-23 DIAGNOSIS — R45851 Suicidal ideations: Secondary | ICD-10-CM | POA: Diagnosis present

## 2023-05-23 DIAGNOSIS — M79606 Pain in leg, unspecified: Secondary | ICD-10-CM | POA: Diagnosis not present

## 2023-05-23 LAB — CBC
HCT: 34 % — ABNORMAL LOW (ref 36.0–46.0)
Hemoglobin: 11.3 g/dL — ABNORMAL LOW (ref 12.0–15.0)
MCH: 27.5 pg (ref 26.0–34.0)
MCHC: 33.2 g/dL (ref 30.0–36.0)
MCV: 82.7 fL (ref 80.0–100.0)
Platelets: 433 10*3/uL — ABNORMAL HIGH (ref 150–400)
RBC: 4.11 MIL/uL (ref 3.87–5.11)
RDW: 16.9 % — ABNORMAL HIGH (ref 11.5–15.5)
WBC: 21.2 10*3/uL — ABNORMAL HIGH (ref 4.0–10.5)
nRBC: 0 % (ref 0.0–0.2)

## 2023-05-23 LAB — COMPREHENSIVE METABOLIC PANEL
ALT: 27 U/L (ref 0–44)
ALT: 28 U/L (ref 0–44)
AST: 44 U/L — ABNORMAL HIGH (ref 15–41)
AST: 49 U/L — ABNORMAL HIGH (ref 15–41)
Albumin: 2.8 g/dL — ABNORMAL LOW (ref 3.5–5.0)
Albumin: 2.9 g/dL — ABNORMAL LOW (ref 3.5–5.0)
Alkaline Phosphatase: 46 U/L (ref 38–126)
Alkaline Phosphatase: 47 U/L (ref 38–126)
Anion gap: 11 (ref 5–15)
Anion gap: 16 — ABNORMAL HIGH (ref 5–15)
BUN: 31 mg/dL — ABNORMAL HIGH (ref 6–20)
BUN: 40 mg/dL — ABNORMAL HIGH (ref 6–20)
CO2: 16 mmol/L — ABNORMAL LOW (ref 22–32)
CO2: 20 mmol/L — ABNORMAL LOW (ref 22–32)
Calcium: 8.2 mg/dL — ABNORMAL LOW (ref 8.9–10.3)
Calcium: 8.6 mg/dL — ABNORMAL LOW (ref 8.9–10.3)
Chloride: 110 mmol/L (ref 98–111)
Chloride: 112 mmol/L — ABNORMAL HIGH (ref 98–111)
Creatinine, Ser: 1.51 mg/dL — ABNORMAL HIGH (ref 0.44–1.00)
Creatinine, Ser: 1.59 mg/dL — ABNORMAL HIGH (ref 0.44–1.00)
GFR, Estimated: 37 mL/min — ABNORMAL LOW (ref >60)
GFR, Estimated: 40 mL/min — ABNORMAL LOW (ref >60)
Glucose, Bld: 113 mg/dL — ABNORMAL HIGH (ref 70–99)
Glucose, Bld: 86 mg/dL (ref 70–99)
Potassium: 3.8 mmol/L (ref 3.5–5.1)
Potassium: 3.9 mmol/L (ref 3.5–5.1)
Sodium: 142 mmol/L (ref 135–145)
Sodium: 143 mmol/L (ref 135–145)
Total Bilirubin: 1.4 mg/dL — ABNORMAL HIGH (ref 0.0–1.2)
Total Bilirubin: 1.6 mg/dL — ABNORMAL HIGH (ref 0.0–1.2)
Total Protein: 5.8 g/dL — ABNORMAL LOW (ref 6.5–8.1)
Total Protein: 6.1 g/dL — ABNORMAL LOW (ref 6.5–8.1)

## 2023-05-23 LAB — TSH: TSH: 0.405 u[IU]/mL (ref 0.350–4.500)

## 2023-05-23 LAB — MAGNESIUM: Magnesium: 1.6 mg/dL — ABNORMAL LOW (ref 1.7–2.4)

## 2023-05-23 LAB — I-STAT CG4 LACTIC ACID, ED: Lactic Acid, Venous: 0.6 mmol/L (ref 0.5–1.9)

## 2023-05-23 LAB — CK: Total CK: 2144 U/L — ABNORMAL HIGH (ref 38–234)

## 2023-05-23 LAB — HEMOGLOBIN A1C
Hgb A1c MFr Bld: 5.2 % (ref 4.8–5.6)
Mean Plasma Glucose: 102.54 mg/dL

## 2023-05-23 LAB — PHOSPHORUS: Phosphorus: 2.1 mg/dL — ABNORMAL LOW (ref 2.5–4.6)

## 2023-05-23 MED ORDER — SODIUM CHLORIDE 0.9 % IV SOLN: 1.0000 g | INTRAVENOUS | Status: DC

## 2023-05-23 MED ORDER — SODIUM CHLORIDE 0.9 % IV SOLN: INTRAVENOUS | Status: AC

## 2023-05-23 MED ORDER — SODIUM CHLORIDE 0.9 % IV SOLN
1.0000 g | INTRAVENOUS | Status: DC
  Administered 2023-05-23 – 2023-05-24 (×2): 1 g via INTRAVENOUS
  Filled 2023-05-23 (×2): qty 10

## 2023-05-23 MED ORDER — ALBUTEROL SULFATE (2.5 MG/3ML) 0.083% IN NEBU: 2.5000 mg | INHALATION_SOLUTION | RESPIRATORY_TRACT | Status: DC | PRN

## 2023-05-23 MED ORDER — SODIUM CHLORIDE 0.9 % IV SOLN: 2.0000 g | INTRAVENOUS | Status: DC

## 2023-05-23 MED ORDER — MIRTAZAPINE 15 MG PO TABS
15.0000 mg | ORAL_TABLET | Freq: Every day | ORAL | Status: DC
  Administered 2023-05-23: 15 mg via ORAL
  Filled 2023-05-23: qty 1

## 2023-05-23 MED ORDER — PANTOPRAZOLE SODIUM 40 MG PO TBEC
40.0000 mg | DELAYED_RELEASE_TABLET | Freq: Every day | ORAL | Status: DC
  Administered 2023-05-23 – 2023-05-26 (×4): 40 mg via ORAL
  Filled 2023-05-23 (×4): qty 1

## 2023-05-23 MED ORDER — ONDANSETRON HCL 4 MG/2ML IJ SOLN
4.0000 mg | Freq: Four times a day (QID) | INTRAMUSCULAR | Status: DC | PRN
  Administered 2023-05-25: 4 mg via INTRAVENOUS
  Filled 2023-05-23: qty 2

## 2023-05-23 MED ORDER — HEPARIN SODIUM (PORCINE) 5000 UNIT/ML IJ SOLN
5000.0000 [IU] | Freq: Three times a day (TID) | INTRAMUSCULAR | Status: DC
  Administered 2023-05-23 – 2023-05-26 (×10): 5000 [IU] via SUBCUTANEOUS
  Filled 2023-05-23 (×9): qty 1

## 2023-05-23 MED ORDER — SODIUM CHLORIDE 0.9 % IV SOLN: INTRAVENOUS | Status: DC

## 2023-05-23 MED ORDER — ONDANSETRON HCL 4 MG PO TABS: 4.0000 mg | ORAL_TABLET | Freq: Four times a day (QID) | ORAL | Status: DC | PRN

## 2023-05-23 MED ORDER — MAGNESIUM SULFATE 2 GM/50ML IV SOLN
2.0000 g | Freq: Once | INTRAVENOUS | Status: AC
Start: 2023-05-23 — End: 2023-05-23
  Administered 2023-05-23: 2 g via INTRAVENOUS
  Filled 2023-05-23: qty 50

## 2023-05-23 NOTE — ED Notes (Signed)
 Spoke with Warehouse manager at poison control. She was given updated lab values and EKG results. She stated to redraw CMP and CK at 0900 in the morning.

## 2023-05-23 NOTE — ED Notes (Signed)
 IVC paperwork was stamped by Sue Lush and not filed and completed, will e-file for Natalie Edwards, and return to him to complete 3 copies, 1 for medical records and original in red folder.

## 2023-05-23 NOTE — Progress Notes (Signed)
 PROGRESS NOTE    Natalie Edwards  ZOX:[id number] DOB: 1965-05-29 DOA: 05/22/2023 PCP: [name], MD     Brief Narrative:  Natalie Edwards is a 58 y.o. female with medical history significant of HTN, GERD, anxiety and depression, Hx of SI with suicide attempts, hx of gastric ulcers, insomnia, leukocytosis who presents to ED brought in by family after patient was found down after attempt at self arm by taking extra doses of sedative home medications.  Poison control was contacted in the emergency department.  She was IVC in the ED.  New events last 24 hours / Subjective: Patient seen in the ED.  Sitter is at bedside.  Patient is alert oriented to self and place.  She does not recall what it happened that brought her to the hospital.  Assessment & Plan:   Principal Problem:   Ingestion of substance, intentional self-harm, sequela (HCC) Active Problems:   AKI (acute kidney injury) (HCC)   GERD (gastroesophageal reflux disease)   MDD (major depressive disorder), recurrent severe, without psychosis (HCC)   SIRS (systemic inflammatory response syndrome) (HCC)   Rhabdomyolysis   Cocaine abuse (HCC)   Intentional overdose with toxic encephalopathy -CT head and cervical spine negative -Patient is currently under IVC.  Psychiatry consulted -Sitter at bedside -Med rec requested   SIRS without clear source  -Presented with tachycardia with heart rate 133, respiratory rate 20, leukocytosis 24.8 -Blood cultures pending -Urine culture pending -Chest x-ray negative -Empiric rocephin for ?UTI   AKI -Presented with creatinine 1.9, trended downward to 1.51 this morning -Continue IV fluid  Rhabdomyolysis -Presented with CK 3722, trended down to 2144 this morning -Continue IV fluid  Cocaine abuse -TOC  Hypomagnesemia -Replace   DVT prophylaxis:  heparin injection 5,000 Units Start: 05/23/23 0600  Code Status: Full code Family Communication: None at bedside Disposition Plan: Per  psychiatry Status is: Inpatient Remains inpatient appropriate because: Psychiatry consult pending    Antimicrobials:  Anti-infectives (From admission, onward)    Start     Dose/Rate Route Frequency Ordered Stop   05/23/23 2300  cefTRIAXone (ROCEPHIN) 1 g in sodium chloride 0.9 % 100 mL IVPB        1 g 200 mL/hr over 30 Minutes Intravenous Every 24 hours 05/23/23 0753     05/23/23 0800  cefTRIAXone (ROCEPHIN) 2 g in sodium chloride 0.9 % 100 mL IVPB  Status:  Discontinued        2 g 200 mL/hr over 30 Minutes Intravenous Every 24 hours 05/23/23 0747 05/23/23 0747   05/23/23 0800  cefTRIAXone (ROCEPHIN) 1 g in sodium chloride 0.9 % 100 mL IVPB  Status:  Discontinued        1 g 200 mL/hr over 30 Minutes Intravenous Every 24 hours 05/23/23 0747 05/23/23 0753   05/22/23 2245  cefTRIAXone (ROCEPHIN) 2 g in sodium chloride 0.9 % 100 mL IVPB        2 g 200 mL/hr over 30 Minutes Intravenous  Once 05/22/23 2242 05/23/23 0014        Objective: Vitals:   05/23/23 0745 05/23/23 0800 05/23/23 1004 05/23/23 1100  BP: 134/76 136/63  137/71  Pulse:  (!) 104  (!) 101  Resp: (!) 21 14  (!) 26  Temp:   99.3 F (37.4 C)   TempSrc:   Oral   SpO2: 100% 99%  100%    Intake/Output Summary (Last 24 hours) at 05/23/2023 1134 Last data filed at 05/23/2023 1116 Gross per 24 hour  Intake 2339.13  ml  Output --  Net 2339.13 ml   There were no vitals filed for this visit.  Examination:  General exam: Appears calm and comfortable  Respiratory system: Clear to auscultation. Respiratory effort normal. No respiratory distress. No conversational dyspnea.  Cardiovascular system: S1 & S2 heard, tachycardic Gastrointestinal system: Abdomen is nondistended, soft  Central nervous system: Alert Extremities: Symmetric in appearance  Skin: No rashes, lesions or ulcers on exposed skin  Psychiatry: Judgement and insight appear poor  Data Reviewed: I have personally reviewed following labs and imaging  studies  CBC: Recent Labs  Lab 05/22/23 2004 05/23/23 0850  WBC 24.8* 21.2*  NEUTROABS 18.4*  --   HGB 13.2 11.3*  HCT 39.3 34.0*  MCV 82.7 82.7  PLT 473* 433*   Basic Metabolic Panel: Recent Labs  Lab 05/22/23 2004 05/22/23 2350 05/23/23 0850  NA 138 142 143  K 4.5 3.8 3.9  CL 105 110 112*  CO2 15* 16* 20*  GLUCOSE 110* 86 113*  BUN 49* 40* 31*  CREATININE 1.90* 1.59* 1.51*  CALCIUM 9.2 8.2* 8.6*  MG 1.9 1.6*  --   PHOS  --  2.1*  --    GFR: CrCl cannot be calculated (Unknown ideal weight.). Liver Function Tests: Recent Labs  Lab 05/22/23 2004 05/22/23 2350 05/23/23 0850  AST 64* 49* 44*  ALT 36 27 28  ALKPHOS 61 47 46  BILITOT 1.8* 1.6* 1.4*  PROT 7.4 5.8* 6.1*  ALBUMIN 3.7 2.8* 2.9*   No results for input(s): "LIPASE", "AMYLASE" in the last 168 hours. No results for input(s): "AMMONIA" in the last 168 hours. Coagulation Profile: No results for input(s): "INR", "PROTIME" in the last 168 hours. Cardiac Enzymes: Recent Labs  Lab 05/22/23 2004 05/23/23 0850  CKTOTAL 3,722* 2,144*   BNP (last 3 results) No results for input(s): "PROBNP" in the last 8760 hours. HbA1C: Recent Labs    05/22/23 2004  HGBA1C 5.2   CBG: Recent Labs  Lab 05/22/23 2002  GLUCAP 128*   Lipid Profile: No results for input(s): "CHOL", "HDL", "LDLCALC", "TRIG", "CHOLHDL", "LDLDIRECT" in the last 72 hours. Thyroid Function Tests: Recent Labs    05/22/23 2350  TSH 0.405   Anemia Panel: No results for input(s): "VITAMINB12", "FOLATE", "FERRITIN", "TIBC", "IRON", "RETICCTPCT" in the last 72 hours. Sepsis Labs: Recent Labs  Lab 05/22/23 2239 05/22/23 2357  LATICACIDVEN 2.4* 0.6    Recent Results (from the past 240 hours)  Blood culture (routine x 2)     Status: None (Preliminary result)   Collection Time: 05/22/23 10:30 PM   Specimen: BLOOD RIGHT HAND  Result Value Ref Range Status   Specimen Description BLOOD RIGHT HAND  Final   Special Requests   Final     BOTTLES DRAWN AEROBIC ONLY Blood Culture results may not be optimal due to an inadequate volume of blood received in culture bottles   Culture   Final    NO GROWTH < 12 HOURS Performed at [facility], [address], [place], [place] [address]    Report Status PENDING  Incomplete  Blood culture (routine x 2)     Status: None (Preliminary result)   Collection Time: 05/22/23 11:04 PM   Specimen: BLOOD LEFT HAND  Result Value Ref Range Status   Specimen Description BLOOD LEFT HAND  Final   Special Requests   Final    BOTTLES DRAWN AEROBIC AND ANAEROBIC Blood Culture adequate volume   Culture   Final    NO  GROWTH < 12 HOURS Performed at [facility], [address] [place], [place] [address]    Report Status PENDING  Incomplete      Radiology Studies: CT Head Wo Contrast Result Date: 05/22/2023 CLINICAL DATA:  Polytrauma, blunt EXAM: CT HEAD WITHOUT CONTRAST CT CERVICAL SPINE WITHOUT CONTRAST TECHNIQUE: Multidetector CT imaging of the head and cervical spine was performed following the standard protocol without intravenous contrast. Multiplanar CT image reconstructions of the cervical spine were also generated. RADIATION DOSE REDUCTION: This exam was performed according to the departmental dose-optimization program which includes automated exposure control, adjustment of the mA and/or kV according to patient size and/or use of iterative reconstruction technique. COMPARISON:  None Available. FINDINGS: CT HEAD FINDINGS Brain: No evidence of acute infarction, hemorrhage, hydrocephalus, extra-axial collection or mass lesion/mass effect. Vascular: Calcific atherosclerosis.  No hyperdense vessel. Skull: No acute fracture. Sinuses/Orbits: Clear sinuses.  No acute orbital findings. Other: No mastoid effusions. CT CERVICAL SPINE FINDINGS Alignment: No substantial sagittal subluxation. Reversal of the normal cervical lordosis. Skull base and vertebrae: No acute fracture. Vertebral body heights  are maintained. Soft tissues and spinal canal: No prevertebral fluid or swelling. No visible canal hematoma. Disc levels: Mild-to-moderate multilevel bony degenerative change. Upper chest: Visualized lung apices are clear. IMPRESSION: No evidence of acute abnormality intracranially or in the cervical spine. Electronically Signed   By: Feliberto Harts M.D.   On: 05/22/2023 22:32   CT Cervical Spine Wo Contrast Result Date: 05/22/2023 CLINICAL DATA:  Polytrauma, blunt EXAM: CT HEAD WITHOUT CONTRAST CT CERVICAL SPINE WITHOUT CONTRAST TECHNIQUE: Multidetector CT imaging of the head and cervical spine was performed following the standard protocol without intravenous contrast. Multiplanar CT image reconstructions of the cervical spine were also generated. RADIATION DOSE REDUCTION: This exam was performed according to the departmental dose-optimization program which includes automated exposure control, adjustment of the mA and/or kV according to patient size and/or use of iterative reconstruction technique. COMPARISON:  None Available. FINDINGS: CT HEAD FINDINGS Brain: No evidence of acute infarction, hemorrhage, hydrocephalus, extra-axial collection or mass lesion/mass effect. Vascular: Calcific atherosclerosis.  No hyperdense vessel. Skull: No acute fracture. Sinuses/Orbits: Clear sinuses.  No acute orbital findings. Other: No mastoid effusions. CT CERVICAL SPINE FINDINGS Alignment: No substantial sagittal subluxation. Reversal of the normal cervical lordosis. Skull base and vertebrae: No acute fracture. Vertebral body heights are maintained. Soft tissues and spinal canal: No prevertebral fluid or swelling. No visible canal hematoma. Disc levels: Mild-to-moderate multilevel bony degenerative change. Upper chest: Visualized lung apices are clear. IMPRESSION: No evidence of acute abnormality intracranially or in the cervical spine. Electronically Signed   By: Feliberto Harts M.D.   On: 05/22/2023 22:32   DG Pelvis  Portable Result Date: 05/22/2023 CLINICAL DATA:  Fall, pain. EXAM: PORTABLE PELVIS 1-2 VIEWS COMPARISON:  None Available. FINDINGS: The cortical margins of the bony pelvis are intact. No fracture. Pubic symphysis and sacroiliac joints are congruent. Both femoral heads are well-seated in the respective acetabula. IMPRESSION: No pelvic fracture. Electronically Signed   By: Narda Rutherford M.D.   On: 05/22/2023 22:21   DG Chest Port 1 View Result Date: 05/22/2023 CLINICAL DATA:  Fall. EXAM: PORTABLE CHEST 1 VIEW COMPARISON:  Chest radiograph 01/16/2023 FINDINGS: Stable upper normal heart size.The cardiomediastinal contours are normal. The lungs are clear. Pulmonary vasculature is normal. No consolidation, pleural effusion, or pneumothorax. No acute osseous abnormalities are seen. Incidental cervical ribs. IMPRESSION: No acute chest findings. Electronically Signed   By: Ivette Loyal.D.  On: 05/22/2023 22:20      Scheduled Meds:  heparin  5,000 Units Subcutaneous Q8H   pantoprazole  40 mg Oral Daily   Continuous Infusions:  sodium chloride 150 mL/hr at 05/23/23 0944   cefTRIAXone (ROCEPHIN)  IV       LOS: 0 days   Time spent: 40 minutes   Noralee Stain, DO [facility] 05/23/2023, 11:34 AM   Available via Epic secure chat 7am-7pm After these hours, please refer to coverage provider listed on [email or web address]

## 2023-05-23 NOTE — Consult Note (Signed)
 [facility] Psychiatric Consult Initial  Patient Name: .Natalie Edwards  MRN: [id number]  DOB: [birth year]  Consult Order details:  Orders (From admission, onward)     Start     Ordered   05/23/23 0747  IP CONSULT TO PSYCHIATRY       Ordering Provider: Noralee Stain, DO  Provider:  (Not yet assigned)  Question Answer Comment  Location [facility]   Reason for Consult? suicide attempt      05/23/23 0746             Mode of Visit: In person    Psychiatry Consult Evaluation  Service Date: May 23, 2023 LOS:  LOS: 0 days  Chief Complaint "I used too much of cocaine, but I didn't plan to kill myself."  Primary Psychiatric Diagnoses  Major depressive disorder, recurrent without psychotic features.  2.  Cocaine abuse and cocaine induced mood disorder   Assessment  Vernell Back is a 58 y.o. female admitted: Presented to the [facility] 05/22/2023  6:47 PM for cocaine overdose. She carries the psychiatric diagnoses of Major depressive disorder, recurrent without psychotic features, Cocaine abuse and previous suicide attempt, and has a past medical history of HTN, GERD.   Her current presentation of recent cocaine overdose with past history of MDD and suicide attempts is most consistent with major depressive disorder, recurrent vs cocaine induced mood disorder. She meets criteria for inpatient psychiatric admission based on recent cocaine overdose and risk of harm to self.  Current outpatient psychotropic medications include Prozac 20 mg daily, Remeron 45 mg at bedtime, Risperidone 0.5 mg twice daily and Hydroxyzine 50 mg twice daily  and historically she has had a good response to these medications. She was complaint compliant with medications prior to admission as evidenced by patient report. On initial examination, patient is awake, alert and oriented to time, place and person. Speech is low volume with poor eye contact. Mood appears depressed with constricted affect. No SI/HI or  perceptual disturbances. Please see plan below for detailed recommendations.   Diagnoses:  Active Hospital problems: Principal Problem:   Ingestion of substance, intentional self-harm, sequela (HCC) Active Problems:   GERD (gastroesophageal reflux disease)   AKI (acute kidney injury) (HCC)   MDD (major depressive disorder), recurrent severe, without psychosis (HCC)   SIRS (systemic inflammatory response syndrome) (HCC)   Rhabdomyolysis   Cocaine abuse (HCC)    Plan   ## Psychiatric Medication Recommendations:  Start Remeron 15 mg at bedtime for sleep/mood for now Please hold other medications for now.  Will benefit from psychiatric inpatient admission was she is medically cleared Consider TOC/Social worker consult to facilitate inpatient transfer.   ## Medical Decision Making Capacity: Not specifically addressed in this encounter  ## Further Work-up:  -- None -- most recent EKG on 05/23/23 had QtC of 398 -- Pertinent labwork reviewed earlier this admission includes: cr-1.51, BUN-31, CA-8.6, AST-44, CK-2, 144, HB-11.3   ## Disposition:-- We recommend inpatient psychiatric hospitalization after medical hospitalization. Patient has been involuntarily committed on 05/23/2023.   ## Behavioral / Environmental: -Delirium Precautions: Delirium Interventions for Nursing and Staff: - RN to open blinds every AM. - To Bedside: Glasses, hearing aide, and pt's own shoes. Make available to patients. when possible and encourage use. - Encourage po fluids when appropriate, keep fluids within reach. - OOB to chair with meals. - Passive ROM exercises to all extremities with AM & PM care. - RN to assess orientation to person, time and place QAM and  PRN. - Recommend extended visitation hours with familiar family/friends as feasible. - Staff to minimize disturbances at night. Turn off television when pt asleep or when not in use.    ## Safety and Observation Level:  - Based on my clinical evaluation, I  estimate the patient to be at moderate risk of self harm in the current setting. - At this time, we recommend  1:1 Observation. This decision is based on my review of the chart including patient's history and current presentation, interview of the patient, mental status examination, and consideration of suicide risk including evaluating suicidal ideation, plan, intent, suicidal or self-harm behaviors, risk factors, and protective factors. This judgment is based on our ability to directly address suicide risk, implement suicide prevention strategies, and develop a safety plan while the patient is in the clinical setting. Please contact our team if there is a concern that risk level has changed.  CSSR Risk Category:C-SSRS RISK CATEGORY: No Risk  Suicide Risk Assessment: Patient has following modifiable risk factors for suicide: under treated depression  and recklessness, which we are addressing by prescribing medications and monitoring . Patient has following non-modifiable or demographic risk factors for suicide: history of suicide attempt Patient has the following protective factors against suicide: Cultural, spiritual, or religious beliefs that discourage suicide  Thank you for this consult request. Recommendations have been communicated to the primary team.  We will follow up at this time.   Fredonia Highland, MD       History of Present Illness  Relevant Aspects of Hospital ED Course:  Admitted on 05/22/2023 for overdose on cocaine.   Patient Report:  Patient seen face to face in the emergency room. She is awake, alert and oriented x 3. Patient is confused and could not remember anything when asked about the circumstances that lead to her current hospitalization. The only thing she could remember is that she used an excess of cocaine before she passed out. It was reported that patient was found in the pool of her urine by her cousin before she was brought to the hospital. Currently, patient denies  the intention to kill herself saying, "I only use cocaine occasionally" even though her toxicology was tested positive for cocaine during her last hospitalization in November 2024. She does not believe cocaine is a problem for her.   Patient denies depressed mood, although looks depressed, she denies hopelessness, helplessness, suicidal or homicidal ideation, intent or plan. She denies feeling anxious, resistless, or apprehensive and denies delusions, hallucinations and other perceptual abnormalities. Patient reports she currently takes medications prescribes by her psychiatrist including Prozac, hydroxyzine, Remeron and low dose risperidone, but unsure of the dosage. She indicates she last took the medications the day before this admission.    Review of EMR indicates patient was admitted for suicide ideation in October 2024 after she was fired from her job and was denied unemployment benefit. She also reports previous history of medication overdose.    Collateral information:  Patient provided collateral to contact her cousin Asencion Islam at [phone] but unsuccessful despite multiple attempts.   ROS   Psychiatric and Social History  Psychiatric History:  Information collected from patient   Prev Dx/Sx: MDD Current Psych Provider: Patient does not know name  Home Meds (current):  Prozac 20 mg daily, Remeron 45 mg at bedtime, Risperidone 0.5 mg twice daily and Hydroxyzine 50. Note-patient unsure of dosage.  Previous Med Trials: as above  Therapy: denies   Prior Psych Hospitalization: multiple times for suicide  ideation and attempts. Last admission was October 2024.    Prior Self Harm: yes  Prior Violence: denies   Family Psych History: denies  Family Hx suicide: denies   Social History:  Educational Hx: unknown  Occupational Hx: unemployed  Armed forces operational officer Hx: denies  Living Situation: currently lives with her cousin who also supports her financially Spiritual Hx: unknown  Access to  weapons/lethal means: denies    Substance History Alcohol: denies   Type of alcohol N/A Last Drink N/A Number of drinks per day N/A History of alcohol withdrawal seizures N/A History of DT's N/A Tobacco: denies  Illicit drugs: cocaine  Prescription drug abuse: denies  Rehab hx: denies   Exam Findings  Physical Exam:  Vital Signs:  Temp:  [97 F (36.1 C)-99.3 F (37.4 C)] 99.3 F (37.4 C) (03/08 1004) Pulse Rate:  [87-133] 99 (03/08 1215) Resp:  [11-27] 16 (03/08 1215) BP: (116-158)/(50-88) 130/67 (03/08 1215) SpO2:  [97 %-100 %] 98 % (03/08 1215) Blood pressure 130/67, pulse 99, temperature 99.3 F (37.4 C), temperature source Oral, resp. rate 16, SpO2 98%. There is no height or weight on file to calculate BMI.  Physical Exam  Mental Status Exam: General Appearance: Casual  Orientation:  Full (Time, Place, and Person)  Memory:  Immediate;   Poor  Concentration:  Concentration: Fair  Recall:  Poor  Attention  Fair  Eye Contact:  Minimal  Speech:  Slow  Language:  Good  Volume:  Decreased  Mood: "depressed"  Affect:  Constricted  Thought Process:  Linear  Thought Content:  Logical  Suicidal Thoughts:  No  Homicidal Thoughts:  No  Judgement:  Fair  Insight:  Lacking  Psychomotor Activity:  Decreased  Akathisia:  No  Fund of Knowledge:  Fair      Assets:  Communication Skills  Cognition:  WNL and Impaired,  Moderate  ADL's:  Intact  AIMS (if indicated):        Other History   These have been pulled in through the EMR, reviewed, and updated if appropriate.  Family History:  The patient's family history includes Cancer in her maternal uncle, maternal uncle, and maternal uncle.  Medical History: Past Medical History:  Diagnosis Date   Allergy    Anemia    Diverticulosis    Gall bladder stones 1993   Gastric ulcer    GERD (gastroesophageal reflux disease)    Hypertension    Renal mass     Surgical History: Past Surgical History:  Procedure  Laterality Date   ABDOMINAL HYSTERECTOMY     BIOPSY  09/29/2022   Procedure: BIOPSY;  Surgeon: Napoleon Form, MD;  Location: [facility] ENDOSCOPY;  Service: Gastroenterology;;   CHOLECYSTECTOMY     COLONOSCOPY WITH ESOPHAGOGASTRODUODENOSCOPY (EGD)  04/07/2023   [facility]   ESOPHAGOGASTRODUODENOSCOPY (EGD) WITH PROPOFOL N/A 09/29/2022   Procedure: ESOPHAGOGASTRODUODENOSCOPY (EGD) WITH PROPOFOL;  Surgeon: Napoleon Form, MD;  Location: [facility] ENDOSCOPY;  Service: Gastroenterology;  Laterality: N/A;     Medications:   Current Facility-Administered Medications:    0.9 %  sodium chloride infusion, , Intravenous, Continuous, Noralee Stain, DO, Last Rate: 100 mL/hr at 05/23/23 1149, New Bag at 05/23/23 1149   albuterol (PROVENTIL) (2.5 MG/3ML) 0.083% nebulizer solution 2.5 mg, 2.5 mg, Nebulization, Q2H PRN, Skip Mayer A, MD   cefTRIAXone (ROCEPHIN) 1 g in sodium chloride 0.9 % 100 mL IVPB, 1 g, Intravenous, Q24H, Knute Neu, RPH   heparin injection 5,000 Units, 5,000 Units, Subcutaneous, Q8H, Skip Mayer  A, MD, 5,000 Units at 05/23/23 0542   ondansetron (ZOFRAN) tablet 4 mg, 4 mg, Oral, Q6H PRN **OR** ondansetron (ZOFRAN) injection 4 mg, 4 mg, Intravenous, Q6H PRN, Lurline Del, MD   pantoprazole (PROTONIX) EC tablet 40 mg, 40 mg, Oral, Daily, Skip Mayer A, MD, 40 mg at 05/23/23 0944  Current Outpatient Medications:    acetaminophen (TYLENOL) 500 MG tablet, Take 500 mg by mouth every 6 (six) hours as needed for moderate pain (pain score 4-6)., Disp: , Rfl:    amLODipine-benazepril (LOTREL) 10-40 MG capsule, Take 1 capsule by mouth daily., Disp: , Rfl:    BELSOMRA 20 MG TABS, Take 1 tablet (20 mg total) by mouth at bedtime., Disp: 30 tablet, Rfl: 1   dicyclomine (BENTYL) 20 MG tablet, Take 1 tablet (20 mg total) by mouth 3 (three) times daily before meals., Disp: 90 tablet, Rfl: 0   FLUoxetine (PROZAC) 20 MG capsule, Take 1 capsule (20 mg total) by mouth daily.,  Disp: 30 capsule, Rfl: 3   hydrochlorothiazide (HYDRODIURIL) 25 MG tablet, Take 25 mg by mouth daily., Disp: , Rfl:    HYDROmorphone (DILAUDID) 4 MG tablet, Take 1 tablet (4 mg total) by mouth every 6 (six) hours as needed for severe pain (pain score 7-10)., Disp: 20 tablet, Rfl: 0   hydrOXYzine (ATARAX) 50 MG tablet, Take 50 mg by mouth 2 (two) times daily as needed for anxiety (sleep)., Disp: , Rfl:    metoCLOPramide (REGLAN) 5 MG tablet, Take 1 tablet (5 mg total) by mouth every 8 (eight) hours as needed for nausea., Disp: 30 tablet, Rfl: 1   mirtazapine (REMERON) 15 MG tablet, Take 1 tablet (15 mg total) by mouth at bedtime., Disp: 30 tablet, Rfl: 3   mirtazapine (REMERON) 45 MG tablet, TAKE 1 TABLET BY MOUTH AT  BEDTIME, Disp: 90 tablet, Rfl: 3   pantoprazole (PROTONIX) 40 MG tablet, Take 1 tablet (40 mg total) by mouth daily. (Patient taking differently: Take 40 mg by mouth daily as needed (acid reflux).), Disp: 30 tablet, Rfl: 3   polyethylene glycol (MIRALAX / GLYCOLAX) 17 g packet, Take 17 g by mouth daily., Disp: 30 each, Rfl: 0   pregabalin (LYRICA) 75 MG capsule, Take 1 capsule (75 mg total) by mouth 2 (two) times daily., Disp: 60 capsule, Rfl: 0   prochlorperazine (COMPAZINE) 10 MG tablet, Take 1 tablet (10 mg total) by mouth every 6 (six) hours as needed for nausea or vomiting., Disp: 30 tablet, Rfl: 0   promethazine (PHENERGAN) 12.5 MG suppository, Place 1 suppository (12.5 mg total) rectally every 8 (eight) hours as needed for nausea or vomiting., Disp: 20 suppository, Rfl: 0   risperiDONE (RISPERDAL) 0.5 MG tablet, Take 1 tablet (0.5 mg total) by mouth 2 (two) times daily at 8 am and 4 pm., Disp: 60 tablet, Rfl: 3   temazepam (RESTORIL) 30 MG capsule, Take 1 capsule (30 mg total) by mouth at bedtime as needed., Disp: 30 capsule, Rfl: 3   traZODone (DESYREL) 100 MG tablet, Take 1 tablet (100 mg total) by mouth at bedtime as needed for sleep., Disp: 30 tablet, Rfl:  3  Allergies: Allergies  Allergen Reactions   Hydroxyzine Other (See Comments)    Palpitations and jitteriness    Ambien [Zolpidem Tartrate] Other (See Comments)    Jitteriness, nervousness, abdominal pain   Ibuprofen     Fredonia Highland, MD

## 2023-05-23 NOTE — ED Notes (Addendum)
 Envelope number [id number] for IVC paperwork. Paperwork has been given back to Natalie Edwards for serving and copies case number- [id number]. [employer] Has been called to serve IVC paperwork

## 2023-05-23 NOTE — ED Notes (Signed)
 Dr Maisie Fus at bedside and asked for me to place order for suicide sitter. Charge nurse made aware and staffing made aware.

## 2023-05-23 NOTE — ED Notes (Signed)
 IVC PAPERWORK  ATTACHED TO THE CLIP BROAD IN GREEN ZONE [employer]ANDACE  THE SECRETARY IS AWARE

## 2023-05-23 NOTE — ED Notes (Signed)
Report given to Felicia RN.

## 2023-05-23 NOTE — ED Notes (Signed)
 Just assumed care of patient. Patient laying in bed in no noted distress at the present time. Patient is on full cardiac monitor with pulse ox. Patient is A&O times 4 just sleepy. Patient is visible from the nurses station. Patient does not have a sitter at beside charge nurse is aware.

## 2023-05-24 ENCOUNTER — Inpatient Hospital Stay (HOSPITAL_COMMUNITY)

## 2023-05-24 DIAGNOSIS — N390 Urinary tract infection, site not specified: Secondary | ICD-10-CM

## 2023-05-24 DIAGNOSIS — T6592XS Toxic effect of unspecified substance, intentional self-harm, sequela: Secondary | ICD-10-CM | POA: Diagnosis not present

## 2023-05-24 DIAGNOSIS — F1414 Cocaine abuse with cocaine-induced mood disorder: Secondary | ICD-10-CM

## 2023-05-24 DIAGNOSIS — F332 Major depressive disorder, recurrent severe without psychotic features: Secondary | ICD-10-CM

## 2023-05-24 DIAGNOSIS — T426X2D Poisoning by other antiepileptic and sedative-hypnotic drugs, intentional self-harm, subsequent encounter: Secondary | ICD-10-CM

## 2023-05-24 DIAGNOSIS — F1410 Cocaine abuse, uncomplicated: Secondary | ICD-10-CM

## 2023-05-24 DIAGNOSIS — R6510 Systemic inflammatory response syndrome (SIRS) of non-infectious origin without acute organ dysfunction: Secondary | ICD-10-CM

## 2023-05-24 DIAGNOSIS — M6282 Rhabdomyolysis: Secondary | ICD-10-CM

## 2023-05-24 DIAGNOSIS — E860 Dehydration: Secondary | ICD-10-CM

## 2023-05-24 LAB — COMPREHENSIVE METABOLIC PANEL
ALT: 23 U/L (ref 0–44)
AST: 29 U/L (ref 15–41)
Albumin: 2.5 g/dL — ABNORMAL LOW (ref 3.5–5.0)
Alkaline Phosphatase: 42 U/L (ref 38–126)
Anion gap: 9 (ref 5–15)
BUN: 16 mg/dL (ref 6–20)
CO2: 18 mmol/L — ABNORMAL LOW (ref 22–32)
Calcium: 8.3 mg/dL — ABNORMAL LOW (ref 8.9–10.3)
Chloride: 113 mmol/L — ABNORMAL HIGH (ref 98–111)
Creatinine, Ser: 1.08 mg/dL — ABNORMAL HIGH (ref 0.44–1.00)
GFR, Estimated: 60 mL/min — ABNORMAL LOW (ref >60)
Glucose, Bld: 90 mg/dL (ref 70–99)
Potassium: 3.5 mmol/L (ref 3.5–5.1)
Sodium: 140 mmol/L (ref 135–145)
Total Bilirubin: 0.6 mg/dL (ref 0.0–1.2)
Total Protein: 5.2 g/dL — ABNORMAL LOW (ref 6.5–8.1)

## 2023-05-24 LAB — CBC
HCT: 31.7 % — ABNORMAL LOW (ref 36.0–46.0)
Hemoglobin: 10.4 g/dL — ABNORMAL LOW (ref 12.0–15.0)
MCH: 27.7 pg (ref 26.0–34.0)
MCHC: 32.8 g/dL (ref 30.0–36.0)
MCV: 84.3 fL (ref 80.0–100.0)
Platelets: 397 10*3/uL (ref 150–400)
RBC: 3.76 MIL/uL — ABNORMAL LOW (ref 3.87–5.11)
RDW: 17.2 % — ABNORMAL HIGH (ref 11.5–15.5)
WBC: 17.6 10*3/uL — ABNORMAL HIGH (ref 4.0–10.5)
nRBC: 0 % (ref 0.0–0.2)

## 2023-05-24 LAB — C-REACTIVE PROTEIN: CRP: 6.7 mg/dL — ABNORMAL HIGH (ref <1.0)

## 2023-05-24 LAB — PHOSPHORUS: Phosphorus: 2.4 mg/dL — ABNORMAL LOW (ref 2.5–4.6)

## 2023-05-24 LAB — MRSA NEXT GEN BY PCR, NASAL: MRSA by PCR Next Gen: NOT DETECTED

## 2023-05-24 LAB — BRAIN NATRIURETIC PEPTIDE: B Natriuretic Peptide: 52.2 pg/mL (ref 0.0–100.0)

## 2023-05-24 LAB — MAGNESIUM: Magnesium: 1.8 mg/dL (ref 1.7–2.4)

## 2023-05-24 LAB — PROTIME-INR
INR: 1.1 (ref 0.8–1.2)
Prothrombin Time: 14.8 s (ref 11.4–15.2)

## 2023-05-24 LAB — CK: Total CK: 1025 U/L — ABNORMAL HIGH (ref 38–234)

## 2023-05-24 LAB — PROCALCITONIN: Procalcitonin: <0.1 ng/mL

## 2023-05-24 LAB — AMMONIA: Ammonia: 26 umol/L (ref 9–35)

## 2023-05-24 MED ORDER — MIRTAZAPINE 15 MG PO TABS
30.0000 mg | ORAL_TABLET | Freq: Every day | ORAL | Status: DC
  Administered 2023-05-24 – 2023-05-25 (×2): 30 mg via ORAL
  Filled 2023-05-24 (×2): qty 2

## 2023-05-24 MED ORDER — ACETAMINOPHEN 325 MG PO TABS: 650.0000 mg | ORAL_TABLET | Freq: Four times a day (QID) | ORAL | Status: DC | PRN

## 2023-05-24 MED ORDER — NAPHAZOLINE-GLYCERIN 0.012-0.25 % OP SOLN: 1.0000 [drp] | Freq: Four times a day (QID) | OPHTHALMIC | Status: DC | PRN

## 2023-05-24 MED ORDER — TRAMADOL HCL 50 MG PO TABS
50.0000 mg | ORAL_TABLET | Freq: Two times a day (BID) | ORAL | Status: DC | PRN
  Administered 2023-05-24 – 2023-05-25 (×2): 50 mg via ORAL
  Filled 2023-05-24 (×2): qty 1

## 2023-05-24 MED ORDER — POTASSIUM PHOSPHATES 15 MMOLE/5ML IV SOLN
30.0000 mmol | Freq: Once | INTRAVENOUS | Status: AC
  Administered 2023-05-24: 30 mmol via INTRAVENOUS
  Filled 2023-05-24: qty 10

## 2023-05-24 NOTE — Plan of Care (Signed)

## 2023-05-24 NOTE — Evaluation (Signed)
 Physical Therapy Evaluation Patient Details Name: Natalie Edwards MRN: [id number] DOB: [birth year] Today's Date: 05/24/2023  History of Present Illness  Pt 58 yo presenting to [facility] ED on 3/7 with family due to pt was found down after attempt at self harm by taking extra doses of sedative home medications. PMH: Htn, GERD, Anxiety, depression, hx of SI with suicide attempts, hx of gastric ulcers, insomnia, leukocytosis.  Clinical Impression  Pt is presenting below baseline level of functioning. Prior to hospitalization pt was ind with all activities. Currently pt is supervision to CGA due to impaired balance. Pt has supportive family. Currently living with cousin [place] but may stay with cousin closer to town so she can get transportation to medical appts. Due to pt current functional status, home set up and available assistance at home no recommended skilled physical therapy services at this time on discharge from acute care hospital setting. Will continue to follow in acute setting in order to ensure that pt returns home with decreased risk for falls, injury, re-hospitalization and improved activity tolerance.          If plan is discharge home, recommend the following: Assistance with cooking/housework;Assist for transportation;Help with stairs or ramp for entrance     Equipment Recommendations Rolling walker (2 wheels)     Functional Status Assessment Patient has had a recent decline in their functional status and demonstrates the ability to make significant improvements in function in a reasonable and predictable amount of time.     Precautions / Restrictions Precautions Precautions: Fall Restrictions Weight Bearing Restrictions Per Provider Order: No      Mobility  Bed Mobility   General bed mobility comments: sitting EOB at arrival and departure    Transfers Overall transfer level: Needs assistance Equipment used: Rolling walker (2 wheels) Transfers: Sit to/from Stand Sit  to Stand: Supervision           General transfer comment: good hand placement    Ambulation/Gait Ambulation/Gait assistance: Contact guard assist Gait Distance (Feet): 150 Feet Assistive device: Rolling walker (2 wheels) Gait Pattern/deviations: Step-through pattern, Decreased stride length Gait velocity: decreased Gait velocity interpretation: 1.31 - 2.62 ft/sec, indicative of limited community ambulator   General Gait Details: CGA to help steady; pt became fatigued after ~ 100 ft. Slight increase in respirations. O2 sats and HR remained WNL     Balance Overall balance assessment: Mild deficits observed, not formally tested           Pertinent Vitals/Pain Pain Assessment Pain Assessment: No/denies pain    Home Living Family/patient expects to be discharged to:: Private residence Living Arrangements: Other relatives (lives with one cousin currently; may go live with other cousin) Available Help at Discharge: Family;Available PRN/intermittently Type of Home: Mobile home Home Access: Stairs to enter Entrance Stairs-Rails: Left;Right;Can reach both Entrance Stairs-Number of Steps: 4   Home Layout: One level Home Equipment: None      Prior Function Prior Level of Function : Independent/Modified Independent       Mobility Comments: denies falls, ambulates without an AD ADLs Comments: mod I to Ind with ADL's and IADL's.     Extremity/Trunk Assessment   Upper Extremity Assessment Upper Extremity Assessment: Overall WFL for tasks assessed    Lower Extremity Assessment Lower Extremity Assessment: Overall WFL for tasks assessed    Cervical / Trunk Assessment Cervical / Trunk Assessment: Normal  Communication   Communication Communication: No apparent difficulties    Cognition Arousal: Alert Behavior During Therapy: Flat affect  PT - Cognitive impairments: No apparent impairments     Following commands: Intact       Cueing Cueing Techniques:  Verbal cues     General Comments General comments (skin integrity, edema, etc.): no noted skin issues outside of gown.        Assessment/Plan    PT Assessment Patient needs continued PT services  PT Problem List Decreased balance;Decreased mobility       PT Treatment Interventions DME instruction;Therapeutic activities;Gait training;Therapeutic exercise;Stair training;Balance training;Functional mobility training;Patient/family education    PT Goals (Current goals can be found in the Care Plan section)  Acute Rehab PT Goals Patient Stated Goal: to get a rolling walker to help PT Goal Formulation: With patient Time For Goal Achievement: 06/07/23 Potential to Achieve Goals: Good    Frequency Min 2X/week        AM-PAC PT "6 Clicks" Mobility  Outcome Measure Help needed turning from your back to your side while in a flat bed without using bedrails?: A Little Help needed moving from lying on your back to sitting on the side of a flat bed without using bedrails?: A Little Help needed moving to and from a bed to a chair (including a wheelchair)?: A Little Help needed standing up from a chair using your arms (e.g., wheelchair or bedside chair)?: A Little Help needed to walk in hospital room?: A Little Help needed climbing 3-5 steps with a railing? : A Little 6 Click Score: 18    End of Session Equipment Utilized During Treatment: Gait belt Activity Tolerance: Patient tolerated treatment well Patient left: in bed;with call bell/phone within reach;with nursing/sitter in room;with bed alarm set Nurse Communication: Mobility status PT Visit Diagnosis: Unsteadiness on feet (R26.81)    Time: 0630-1601 PT Time Calculation (min) (ACUTE ONLY): 10 min   Charges:   PT Evaluation $PT Eval Low Complexity: 1 Low   PT General Charges $$ ACUTE PT VISIT: 1 Visit        Harrel Carina, DPT, CLT  Acute Rehabilitation Services Office: [phone] (Secure chat  preferred)   Claudia Desanctis 05/24/2023, 4:34 PM

## 2023-05-24 NOTE — Consult Note (Signed)
 [facility] Consult Follow-up  Patient Name: .Natalie Edwards  MRN: [id number]  DOB: 1966-01-28  Consult Order details:  Orders (From admission, onward)     Start     Ordered   05/23/23 0747  IP CONSULT TO PSYCHIATRY       Ordering Provider: Noralee Stain, DO  Provider:  (Not yet assigned)  Question Answer Comment  Location [facility]   Reason for Consult? suicide attempt      05/23/23 0746             Mode of Visit: In person    Psychiatry Consult Evaluation  Service Date: May 24, 2023 LOS:  LOS: 1 day  Chief Complaint "I used too much of cocaine, but I didn't plan to kill myself."  Primary Psychiatric Diagnoses  Major depressive disorder, recurrent without psychotic features.  2.  Cocaine abuse and cocaine induced mood disorder   Assessment  Natalie Edwards is a 58 y.o. female admitted: Presented to the [facility] 05/22/2023  6:47 PM for cocaine overdose. She carries the psychiatric diagnoses of Major depressive disorder, recurrent without psychotic features, Cocaine abuse and previous suicide attempt, and has a past medical history of HTN, GERD.   Her current presentation of recent cocaine overdose with past history of MDD and suicide attempts is most consistent with major depressive disorder, recurrent vs cocaine induced mood disorder. She meets criteria for inpatient psychiatric admission based on recent cocaine overdose and risk of harm to self.  Current outpatient psychotropic medications include Prozac 20 mg daily, Remeron 45 mg at bedtime, Risperidone 0.5 mg twice daily and Hydroxyzine 50 mg twice daily  and historically she has had a good response to these medications. She was complaint compliant with medications prior to admission as evidenced by patient report. On initial examination, patient is awake, alert and oriented to time, place and person. Speech is low volume with poor eye contact. Mood appears depressed with constricted affect. No SI/HI or  perceptual disturbances. Please see plan below for detailed recommendations.   05/24/2023: Patient seen face to face in her hospital room. She is awake, alert and oriented to time, place, person, and situation. Patient reports she did not sleep well last night as she was frequently waking up due to cough, however, patient states she feels more rested and relaxed since she has been in the hospital. Patient reports her mood is stable, and denies feeling anxious, apprehensive, restless or nervous. Patient also denies delusions, hallucinations, suicidal or homicidal ideation, intent or plan. However, she accepted to be transferred to inpatient psychiatric unit once she is medically cleared considering her recent drug overdose.   Diagnoses:  Active Hospital problems: Principal Problem:   Ingestion of substance, intentional self-harm, sequela (HCC) Active Problems:   GERD (gastroesophageal reflux disease)   AKI (acute kidney injury) (HCC)   MDD (major depressive disorder), recurrent severe, without psychosis (HCC)   SIRS (systemic inflammatory response syndrome) (HCC)   Rhabdomyolysis   Cocaine abuse (HCC)    Plan   ## Psychiatric Medication Recommendations:  Increase Remeron to 30 mg at bedtime for sleep/mood with the plan to up titrate as needed. Note:Home dosage is 45 mg at bedtime.  Please continue to hold other medications for now.  Will benefit from psychiatric inpatient admission was she is medically cleared Consider TOC/Social worker consult to facilitate inpatient transfer.   ## Medical Decision Making Capacity: Not specifically addressed in this encounter  ## Further Work-up:  -- None -- most recent  EKG on 05/23/23 had QtC of 398 -- Pertinent labwork reviewed earlier this admission includes: cr-1.51, BUN-31, CA-8.6, AST-44, CK-2, 144, HB-11.3   ## Disposition:-- We recommend inpatient psychiatric hospitalization after medical hospitalization. Patient has been involuntarily committed  on 05/23/2023.   ## Behavioral / Environmental: -Delirium Precautions: Delirium Interventions for Nursing and Staff: - RN to open blinds every AM. - To Bedside: Glasses, hearing aide, and pt's own shoes. Make available to patients. when possible and encourage use. - Encourage po fluids when appropriate, keep fluids within reach. - OOB to chair with meals. - Passive ROM exercises to all extremities with AM & PM care. - RN to assess orientation to person, time and place QAM and PRN. - Recommend extended visitation hours with familiar family/friends as feasible. - Staff to minimize disturbances at night. Turn off television when pt asleep or when not in use.    ## Safety and Observation Level:  - Based on my clinical evaluation, I estimate the patient to be at moderate risk of self harm in the current setting. - At this time, we recommend  1:1 Observation. This decision is based on my review of the chart including patient's history and current presentation, interview of the patient, mental status examination, and consideration of suicide risk including evaluating suicidal ideation, plan, intent, suicidal or self-harm behaviors, risk factors, and protective factors. This judgment is based on our ability to directly address suicide risk, implement suicide prevention strategies, and develop a safety plan while the patient is in the clinical setting. Please contact our team if there is a concern that risk level has changed.  CSSR Risk Category:C-SSRS RISK CATEGORY: No Risk  Suicide Risk Assessment: Patient has following modifiable risk factors for suicide: under treated depression  and recklessness, which we are addressing by prescribing medications and monitoring . Patient has following non-modifiable or demographic risk factors for suicide: history of suicide attempt Patient has the following protective factors against suicide: Cultural, spiritual, or religious beliefs that discourage suicide  Thank you for  this consult request. Recommendations have been communicated to the primary team.  We will follow up at this time.   Fredonia Highland, MD       History of Present Illness  Relevant Aspects of Hospital ED Course:  Admitted on 05/22/2023 for overdose on cocaine.   Patient Report:  Patient seen face to face in the emergency room. She is awake, alert and oriented x 3. Patient is confused and could not remember anything when asked about the circumstances that lead to her current hospitalization. The only thing she could remember is that she used an excess of cocaine before she passed out. It was reported that patient was found in the pool of her urine by her cousin before she was brought to the hospital. Currently, patient denies the intention to kill herself saying, "I only use cocaine occasionally" even though her toxicology was tested positive for cocaine during her last hospitalization in November 2024. She does not believe cocaine is a problem for her.   Patient denies depressed mood, although looks depressed, she denies hopelessness, helplessness, suicidal or homicidal ideation, intent or plan. She denies feeling anxious, resistless, or apprehensive and denies delusions, hallucinations and other perceptual abnormalities. Patient reports she currently takes medications prescribes by her psychiatrist including Prozac, hydroxyzine, Remeron and low dose risperidone, but unsure of the dosage. She indicates she last took the medications the day before this admission.    Review of EMR indicates patient was admitted  for suicide ideation in October 2024 after she was fired from her job and was denied unemployment benefit. She also reports previous history of medication overdose.    Collateral information:  Patient provided collateral to contact her cousin Asencion Islam at [phone] but unsuccessful despite multiple attempts.   ROS   Psychiatric and Social History  Psychiatric History:  Information collected  from patient   Prev Dx/Sx: MDD Current Psych Provider: Patient does not know name  Home Meds (current):  Prozac 20 mg daily, Remeron 45 mg at bedtime, Risperidone 0.5 mg twice daily and Hydroxyzine 50. Note-patient unsure of dosage.  Previous Med Trials: as above  Therapy: denies   Prior Psych Hospitalization: multiple times for suicide ideation and attempts. Last admission was October 2024.    Prior Self Harm: yes  Prior Violence: denies   Family Psych History: denies  Family Hx suicide: denies   Social History:  Educational Hx: unknown  Occupational Hx: unemployed  Armed forces operational officer Hx: denies  Living Situation: currently lives with her cousin who also supports her financially Spiritual Hx: unknown  Access to weapons/lethal means: denies    Substance History Alcohol: denies   Type of alcohol N/A Last Drink N/A Number of drinks per day N/A History of alcohol withdrawal seizures N/A History of DT's N/A Tobacco: denies  Illicit drugs: cocaine  Prescription drug abuse: denies  Rehab hx: denies   Exam Findings  Physical Exam:  Vital Signs:  Temp:  [98.1 F (36.7 C)-98.5 F (36.9 C)] 98.2 F (36.8 C) (03/09 0408) Pulse Rate:  [83-97] 94 (03/09 0911) Resp:  [12-22] 20 (03/09 0911) BP: (111-154)/(62-74) 111/74 (03/09 0408) SpO2:  [99 %-100 %] 100 % (03/09 0911) Blood pressure 111/74, pulse 94, temperature 98.2 F (36.8 C), temperature source Oral, resp. rate 20, SpO2 100%. There is no height or weight on file to calculate BMI.  Physical Exam  Mental Status Exam: General Appearance: Casual  Orientation:  Full (Time, Place, and Person)  Memory:  Immediate;   Poor  Concentration:  Concentration: Fair  Recall:  Poor  Attention  Fair  Eye Contact:  Minimal  Speech:  Slow  Language:  Good  Volume:  Decreased  Mood: "depressed"  Affect:  Constricted  Thought Process:  Linear  Thought Content:  Logical  Suicidal Thoughts:  No  Homicidal Thoughts:  No  Judgement:  Fair   Insight:  Lacking  Psychomotor Activity:  Decreased  Akathisia:  No  Fund of Knowledge:  Fair      Assets:  Communication Skills  Cognition:  WNL and Impaired,  Moderate  ADL's:  Intact  AIMS (if indicated):        Other History   These have been pulled in through the EMR, reviewed, and updated if appropriate.  Family History:  The patient's family history includes Cancer in her maternal uncle, maternal uncle, and maternal uncle.  Medical History: Past Medical History:  Diagnosis Date   Allergy    Anemia    Diverticulosis    Gall bladder stones 1993   Gastric ulcer    GERD (gastroesophageal reflux disease)    Hypertension    Renal mass     Surgical History: Past Surgical History:  Procedure Laterality Date   ABDOMINAL HYSTERECTOMY     BIOPSY  09/29/2022   Procedure: BIOPSY;  Surgeon: Napoleon Form, MD;  Location: [facility];  Service: Gastroenterology;;   CHOLECYSTECTOMY     COLONOSCOPY WITH ESOPHAGOGASTRODUODENOSCOPY (EGD)  04/07/2023   [facility]  ESOPHAGOGASTRODUODENOSCOPY (EGD) WITH PROPOFOL N/A 09/29/2022   Procedure: ESOPHAGOGASTRODUODENOSCOPY (EGD) WITH PROPOFOL;  Surgeon: Napoleon Form, MD;  Location: [facility];  Service: Gastroenterology;  Laterality: N/A;     Medications:   Current Facility-Administered Medications:    acetaminophen (TYLENOL) tablet 650 mg, 650 mg, Oral, Q6H PRN, Thedore Mins, Stanford Scotland, MD   albuterol (PROVENTIL) (2.5 MG/3ML) 0.083% nebulizer solution 2.5 mg, 2.5 mg, Nebulization, Q2H PRN, Skip Mayer A, MD   cefTRIAXone (ROCEPHIN) 1 g in sodium chloride 0.9 % 100 mL IVPB, 1 g, Intravenous, Q24H, Knute Neu, RPH, Stopped at 05/23/23 2328   heparin injection 5,000 Units, 5,000 Units, Subcutaneous, Q8H, Skip Mayer A, MD, 5,000 Units at 05/24/23 1321   mirtazapine (REMERON) tablet 15 mg, 15 mg, Oral, QHS, Prosper Paff A, MD, 15 mg at 05/23/23 2107   naphazoline-glycerin (CLEAR EYES REDNESS) ophth solution  1-2 drop, 1-2 drop, Both Eyes, QID PRN, Leroy Sea, MD   ondansetron (ZOFRAN) tablet 4 mg, 4 mg, Oral, Q6H PRN **OR** ondansetron (ZOFRAN) injection 4 mg, 4 mg, Intravenous, Q6H PRN, Skip Mayer A, MD   pantoprazole (PROTONIX) EC tablet 40 mg, 40 mg, Oral, Daily, Skip Mayer A, MD, 40 mg at 05/24/23 0800   potassium PHOSPHATE 30 mmol in dextrose 5 % 500 mL infusion, 30 mmol, Intravenous, Once, Susa Raring K, MD, Last Rate: 85 mL/hr at 05/24/23 1320, 30 mmol at 05/24/23 1320   traMADol (ULTRAM) tablet 50 mg, 50 mg, Oral, Q12H PRN, Leroy Sea, MD, 50 mg at 05/24/23 1321  Allergies: Allergies  Allergen Reactions   Hydroxyzine Other (See Comments)    Palpitations and jitteriness    Ambien [Zolpidem Tartrate] Other (See Comments)    Jitteriness, nervousness, abdominal pain   Ibuprofen     Fredonia Highland, MD

## 2023-05-24 NOTE — Progress Notes (Signed)
 RT delivered acapella to patient and educated/demonstrated instructions on use. Patient verbally understands and was able to complete exercise independently.

## 2023-05-24 NOTE — Plan of Care (Signed)
  Problem: Education: Goal: Knowledge of General Education information will improve Description: Including pain rating scale, medication(s)/side effects and non-pharmacologic comfort measures Outcome: Progressing   Problem: Coping: Goal: Level of anxiety will decrease Outcome: Progressing   Problem: Pain Managment: Goal: General experience of comfort will improve and/or be controlled Outcome: Progressing

## 2023-05-24 NOTE — Progress Notes (Signed)
 PROGRESS NOTE    Natalie Edwards  UEA:[id number] DOB: December 27, 1965 DOA: 05/22/2023 PCP: [name], MD     Brief Narrative:  Natalie Edwards is a 58 y.o. female with medical history significant of HTN, GERD, anxiety and depression, Hx of SI with suicide attempts, hx of gastric ulcers, insomnia, leukocytosis who presents to ED brought in by family after patient was found down after attempt at self arm by taking extra doses of sedative home medications.  Poison control was contacted in the emergency department.  She was IVC in the ED.  New events last 24 hours / Subjective: Patient in bed, appears comfortable, denies any headache, no fever, no chest pain or pressure, no shortness of breath , no abdominal pain. No focal weakness.  Currently not suicidal or homicidal.  Assessment & Plan:   Principal Problem:   Ingestion of substance, intentional self-harm, sequela (HCC) Active Problems:   AKI (acute kidney injury) (HCC)   GERD (gastroesophageal reflux disease)   MDD (major depressive disorder), recurrent severe, without psychosis (HCC)   SIRS (systemic inflammatory response syndrome) (HCC)   Rhabdomyolysis   Cocaine abuse (HCC)   Intentional overdose with toxic encephalopathy due to overdose from sedative medications. -CT head and cervical spine negative -Patient is currently under IVC.  Psychiatry consulted, per psych will require [facility] placement.  Continue sitter at bedside.  Currently not suicidal homicidal.  Medication effects seem to have resolved, initial talk screen negative, LFTs stable INR stable.  Cleared by poison control on 05/24/2023.  SIRS without clear source  -Chest x-ray clear, likely has UTI, responding well to Rocephin continue and follow cultures  AKI -Presented with creatinine 1.9, trended downward to 1.51 this morning -Continue IV fluid  Rhabdomyolysis -Presented with CK 3722, trend stable and improving -Improving with IV fluids  Cocaine abuse -Was counseled to  quit  Hypomagnesemia and hypophosphatemia -Replace   DVT prophylaxis:  heparin injection 5,000 Units Start: 05/23/23 0600  Code Status: Full code Family Communication: None at bedside Disposition Plan: Per psychiatry Status is: Inpatient Remains inpatient appropriate because: Will require [facility]   Objective: Vitals:   05/23/23 1907 05/24/23 0002 05/24/23 0408 05/24/23 0911  BP: 135/62 130/66 111/74   Pulse:   97 94  Resp: 13 12  20   Temp: 98.5 F (36.9 C) 98.5 F (36.9 C) 98.2 F (36.8 C)   TempSrc: Oral Oral Oral   SpO2: 99% 100% 99% 100%    Intake/Output Summary (Last 24 hours) at 05/24/2023 1135 Last data filed at 05/24/2023 0300 Gross per 24 hour  Intake 3206.06 ml  Output --  Net 3206.06 ml   There were no vitals filed for this visit.  Examination:   Awake Alert, No new F.N deficits, Normal affect [place].AT,PERRAL Supple Neck, No JVD,   Symmetrical Chest wall movement, Good air movement bilaterally, CTAB RRR,No Gallops, Rubs or new Murmurs,  +ve B.Sounds, Abd Soft, No tenderness,   No Cyanosis, Clubbing or edema    Data Reviewed: I have personally reviewed following labs and imaging studies     Recent Results (from the past 240 hours)  Blood culture (routine x 2)     Status: None (Preliminary result)   Collection Time: 05/22/23 10:30 PM   Specimen: BLOOD RIGHT HAND  Result Value Ref Range Status   Specimen Description BLOOD RIGHT HAND  Final   Special Requests   Final    BOTTLES DRAWN AEROBIC ONLY Blood Culture results may not be optimal due to an inadequate volume of  blood received in culture bottles   Culture   Final    NO GROWTH 2 DAYS Performed at [facility], [address] [place], [place] [address]    Report Status PENDING  Incomplete  Blood culture (routine x 2)     Status: None (Preliminary result)   Collection Time: 05/22/23 11:04 PM   Specimen: BLOOD LEFT HAND  Result Value Ref Range Status   Specimen Description BLOOD LEFT HAND   Final   Special Requests   Final    BOTTLES DRAWN AEROBIC AND ANAEROBIC Blood Culture adequate volume   Culture   Final    NO GROWTH 2 DAYS Performed at [facility], [address] [place], [place] [address]    Report Status PENDING  Incomplete  Urine Culture     Status: Abnormal (Preliminary result)   Collection Time: 05/23/23 11:52 AM   Specimen: Urine, Clean Catch  Result Value Ref Range Status   Specimen Description URINE, CLEAN CATCH  Final   Special Requests NONE  Final   Culture (A)  Final    >=100,000 COLONIES/mL GRAM NEGATIVE RODS CULTURE REINCUBATED FOR BETTER GROWTH Performed at [facility], [address], [place], [place] [address]    Report Status PENDING  Incomplete  MRSA Next Gen by PCR, Nasal     Status: None   Collection Time: 05/24/23  5:36 AM   Specimen: Nasal Mucosa; Nasal Swab  Result Value Ref Range Status   MRSA by PCR Next Gen NOT DETECTED NOT DETECTED Final    Comment: (NOTE) The GeneXpert MRSA Assay (FDA approved for NASAL specimens only), is one component of a comprehensive MRSA colonization surveillance program. It is not intended to diagnose MRSA infection nor to guide or monitor treatment for MRSA infections. Test performance is not FDA approved in patients less than 42 years old. Performed at [facility], [address], [place], [place] [address]       Data Review:   Inpatient Medications  Scheduled Meds:  heparin  5,000 Units Subcutaneous Q8H   mirtazapine  15 mg Oral QHS   pantoprazole  40 mg Oral Daily   Continuous Infusions:  sodium chloride 100 mL/hr at 05/24/23 0300   cefTRIAXone (ROCEPHIN)  IV Stopped (05/23/23 2328)   potassium PHOSPHATE IVPB (in mmol)     PRN Meds:.acetaminophen, albuterol, naphazoline-glycerin, ondansetron **OR** ondansetron (ZOFRAN) IV, traMADol  DVT Prophylaxis  heparin injection 5,000 Units Start: 05/23/23 0600   Recent Labs  Lab 05/22/23 2004 05/23/23 0850 05/24/23 0616   WBC 24.8* 21.2* 17.6*  HGB 13.2 11.3* 10.4*  HCT 39.3 34.0* 31.7*  PLT 473* 433* 397  MCV 82.7 82.7 84.3  MCH 27.8 27.5 27.7  MCHC 33.6 33.2 32.8  RDW 17.5* 16.9* 17.2*  LYMPHSABS 4.0  --   --   MONOABS 2.1*  --   --   EOSABS 0.0  --   --   BASOSABS 0.0  --   --     Recent Labs  Lab 05/22/23 2004 05/22/23 2239 05/22/23 2350 05/22/23 2357 05/23/23 0850 05/24/23 0616  NA 138  --  142  --  143 140  K 4.5  --  3.8  --  3.9 3.5  CL 105  --  110  --  112* 113*  CO2 15*  --  16*  --  20* 18*  ANIONGAP 18*  --  16*  --  11 9  GLUCOSE 110*  --  86  --  113* 90  BUN 49*  --  40*  --  31* 16  CREATININE 1.90*  --  1.59*  --  1.51* 1.08*  AST 64*  --  49*  --  44* 29  ALT 36  --  27  --  28 23  ALKPHOS 61  --  47  --  46 42  BILITOT 1.8*  --  1.6*  --  1.4* 0.6  ALBUMIN 3.7  --  2.8*  --  2.9* 2.5*  CRP  --   --   --   --   --  6.7*  PROCALCITON  --   --   --   --   --  <0.10  LATICACIDVEN  --  2.4*  --  0.6  --   --   INR  --   --   --   --   --  1.1  TSH  --   --  0.405  --   --   --   HGBA1C 5.2  --   --   --   --   --   AMMONIA  --   --   --   --   --  26  BNP  --   --   --   --   --  52.2  MG 1.9  --  1.6*  --   --  1.8  PHOS  --   --  2.1*  --   --  2.4*  CALCIUM 9.2  --  8.2*  --  8.6* 8.3*      Recent Labs  Lab 05/22/23 2004 05/22/23 2239 05/22/23 2350 05/22/23 2357 05/23/23 0850 05/24/23 0616  CRP  --   --   --   --   --  6.7*  PROCALCITON  --   --   --   --   --  <0.10  LATICACIDVEN  --  2.4*  --  0.6  --   --   INR  --   --   --   --   --  1.1  TSH  --   --  0.405  --   --   --   HGBA1C 5.2  --   --   --   --   --   AMMONIA  --   --   --   --   --  26  BNP  --   --   --   --   --  52.2  MG 1.9  --  1.6*  --   --  1.8  CALCIUM 9.2  --  8.2*  --  8.6* 8.3*    --------------------------------------------------------------------------------------------------------------- Lab Results  Component Value Date   CHOL 202 (H) 12/13/2021   HDL 76  12/13/2021   LDLCALC 106 (H) 12/13/2021   TRIG 113 12/13/2021   CHOLHDL 2.7 12/13/2021    Lab Results  Component Value Date   HGBA1C 5.2 05/22/2023   Recent Labs    05/22/23 2350  TSH 0.405   No results for input(s): "VITAMINB12", "FOLATE", "FERRITIN", "TIBC", "IRON", "RETICCTPCT" in the last 72 hours. ------------------------------------------------------------------------------------------------------------------ Cardiac Enzymes No results for input(s): "CKMB", "TROPONINI", "MYOGLOBIN" in the last 168 hours.  Invalid input(s): "CK"  Micro Results Recent Results (from the past 240 hours)  Blood culture (routine x 2)     Status: None (Preliminary result)   Collection Time: 05/22/23 10:30 PM   Specimen: BLOOD RIGHT HAND  Result Value Ref Range Status   Specimen Description BLOOD RIGHT HAND  Final   Special Requests   Final    BOTTLES DRAWN AEROBIC ONLY Blood Culture results may not be optimal due to an inadequate volume of blood received in culture bottles   Culture   Final    NO GROWTH 2 DAYS Performed at [facility], [address] [place], [place] [address]    Report Status PENDING  Incomplete  Blood culture (routine x 2)     Status: None (Preliminary result)   Collection Time: 05/22/23 11:04 PM   Specimen: BLOOD LEFT HAND  Result Value Ref Range Status   Specimen Description BLOOD LEFT HAND  Final   Special Requests   Final    BOTTLES DRAWN AEROBIC AND ANAEROBIC Blood Culture adequate volume   Culture   Final    NO GROWTH 2 DAYS Performed at [facility], [address], [place], [place] [address]    Report Status PENDING  Incomplete  Urine Culture     Status: Abnormal (Preliminary result)   Collection Time: 05/23/23 11:52 AM   Specimen: Urine, Clean Catch  Result Value Ref Range Status   Specimen Description URINE, CLEAN CATCH  Final   Special Requests NONE  Final   Culture (A)  Final    >=100,000 COLONIES/mL GRAM NEGATIVE RODS CULTURE  REINCUBATED FOR BETTER GROWTH Performed at [facility], [address], [place], [place] [address]    Report Status PENDING  Incomplete  MRSA Next Gen by PCR, Nasal     Status: None   Collection Time: 05/24/23  5:36 AM   Specimen: Nasal Mucosa; Nasal Swab  Result Value Ref Range Status   MRSA by PCR Next Gen NOT DETECTED NOT DETECTED Final    Comment: (NOTE) The GeneXpert MRSA Assay (FDA approved for NASAL specimens only), is one component of a comprehensive MRSA colonization surveillance program. It is not intended to diagnose MRSA infection nor to guide or monitor treatment for MRSA infections. Test performance is not FDA approved in patients less than 13 years old. Performed at [facility], [address], [place], [place] [address]     Radiology Reports  DG Chest [place] 1 View Result Date: 05/24/2023 CLINICAL DATA:  Shortness of breath. EXAM: PORTABLE CHEST 1 VIEW COMPARISON:  05/22/2023 FINDINGS: The lungs are clear without focal pneumonia, edema, pneumothorax or pleural effusion. Cardiopericardial silhouette is at upper limits of normal for size. No acute bony abnormality. Telemetry leads overlie the chest. IMPRESSION: No active disease. Electronically Signed   By: Kennith Center M.D.   On: 05/24/2023 08:00   CT Head Wo Contrast Result Date: 05/22/2023 CLINICAL DATA:  Polytrauma, blunt EXAM: CT HEAD WITHOUT CONTRAST CT CERVICAL SPINE WITHOUT CONTRAST TECHNIQUE: Multidetector CT imaging of the head and cervical spine was performed following the standard protocol without intravenous contrast. Multiplanar CT image reconstructions of the cervical spine were also generated. RADIATION DOSE REDUCTION: This exam was performed according to the departmental dose-optimization program which includes automated exposure control, adjustment of the mA and/or kV according to patient size and/or use of iterative reconstruction technique. COMPARISON:  None Available. FINDINGS: CT HEAD FINDINGS  Brain: No evidence of acute infarction, hemorrhage, hydrocephalus, extra-axial collection or mass lesion/mass effect. Vascular: Calcific atherosclerosis.  No hyperdense vessel. Skull: No acute fracture. Sinuses/Orbits: Clear sinuses.  No acute orbital findings. Other: No mastoid effusions. CT CERVICAL SPINE FINDINGS Alignment: No substantial sagittal subluxation. Reversal of  the normal cervical lordosis. Skull base and vertebrae: No acute fracture. Vertebral body heights are maintained. Soft tissues and spinal canal: No prevertebral fluid or swelling. No visible canal hematoma. Disc levels: Mild-to-moderate multilevel bony degenerative change. Upper chest: Visualized lung apices are clear. IMPRESSION: No evidence of acute abnormality intracranially or in the cervical spine. Electronically Signed   By: Feliberto Harts M.D.   On: 05/22/2023 22:32   CT Cervical Spine Wo Contrast Result Date: 05/22/2023 CLINICAL DATA:  Polytrauma, blunt EXAM: CT HEAD WITHOUT CONTRAST CT CERVICAL SPINE WITHOUT CONTRAST TECHNIQUE: Multidetector CT imaging of the head and cervical spine was performed following the standard protocol without intravenous contrast. Multiplanar CT image reconstructions of the cervical spine were also generated. RADIATION DOSE REDUCTION: This exam was performed according to the departmental dose-optimization program which includes automated exposure control, adjustment of the mA and/or kV according to patient size and/or use of iterative reconstruction technique. COMPARISON:  None Available. FINDINGS: CT HEAD FINDINGS Brain: No evidence of acute infarction, hemorrhage, hydrocephalus, extra-axial collection or mass lesion/mass effect. Vascular: Calcific atherosclerosis.  No hyperdense vessel. Skull: No acute fracture. Sinuses/Orbits: Clear sinuses.  No acute orbital findings. Other: No mastoid effusions. CT CERVICAL SPINE FINDINGS Alignment: No substantial sagittal subluxation. Reversal of the normal cervical  lordosis. Skull base and vertebrae: No acute fracture. Vertebral body heights are maintained. Soft tissues and spinal canal: No prevertebral fluid or swelling. No visible canal hematoma. Disc levels: Mild-to-moderate multilevel bony degenerative change. Upper chest: Visualized lung apices are clear. IMPRESSION: No evidence of acute abnormality intracranially or in the cervical spine. Electronically Signed   By: Feliberto Harts M.D.   On: 05/22/2023 22:32   DG Pelvis Portable Result Date: 05/22/2023 CLINICAL DATA:  Fall, pain. EXAM: PORTABLE PELVIS 1-2 VIEWS COMPARISON:  None Available. FINDINGS: The cortical margins of the bony pelvis are intact. No fracture. Pubic symphysis and sacroiliac joints are congruent. Both femoral heads are well-seated in the respective acetabula. IMPRESSION: No pelvic fracture. Electronically Signed   By: Narda Rutherford M.D.   On: 05/22/2023 22:21   DG Chest Port 1 View Result Date: 05/22/2023 CLINICAL DATA:  Fall. EXAM: PORTABLE CHEST 1 VIEW COMPARISON:  Chest radiograph 01/16/2023 FINDINGS: Stable upper normal heart size.The cardiomediastinal contours are normal. The lungs are clear. Pulmonary vasculature is normal. No consolidation, pleural effusion, or pneumothorax. No acute osseous abnormalities are seen. Incidental cervical ribs. IMPRESSION: No acute chest findings. Electronically Signed   By: Narda Rutherford M.D.   On: 05/22/2023 22:20      Signature  -   Susa Raring M.D on 05/24/2023 at 11:35 AM   -  To page go to [email or web address]

## 2023-05-25 DIAGNOSIS — T6592XS Toxic effect of unspecified substance, intentional self-harm, sequela: Secondary | ICD-10-CM | POA: Diagnosis not present

## 2023-05-25 LAB — CBC WITH DIFFERENTIAL/PLATELET
Abs Immature Granulocytes: 0.11 10*3/uL — ABNORMAL HIGH (ref 0.00–0.07)
Basophils Absolute: 0 10*3/uL (ref 0.0–0.1)
Basophils Relative: 0 %
Eosinophils Absolute: 0.3 10*3/uL (ref 0.0–0.5)
Eosinophils Relative: 2 %
HCT: 30.1 % — ABNORMAL LOW (ref 36.0–46.0)
Hemoglobin: 9.9 g/dL — ABNORMAL LOW (ref 12.0–15.0)
Immature Granulocytes: 1 %
Lymphocytes Relative: 47 %
Lymphs Abs: 6.6 10*3/uL — ABNORMAL HIGH (ref 0.7–4.0)
MCH: 27.4 pg (ref 26.0–34.0)
MCHC: 32.9 g/dL (ref 30.0–36.0)
MCV: 83.4 fL (ref 80.0–100.0)
Monocytes Absolute: 0.9 10*3/uL (ref 0.1–1.0)
Monocytes Relative: 7 %
Neutro Abs: 6.1 10*3/uL (ref 1.7–7.7)
Neutrophils Relative %: 43 %
Platelets: 409 10*3/uL — ABNORMAL HIGH (ref 150–400)
RBC: 3.61 MIL/uL — ABNORMAL LOW (ref 3.87–5.11)
RDW: 16.8 % — ABNORMAL HIGH (ref 11.5–15.5)
WBC: 14.2 10*3/uL — ABNORMAL HIGH (ref 4.0–10.5)
nRBC: 0 % (ref 0.0–0.2)

## 2023-05-25 LAB — COMPREHENSIVE METABOLIC PANEL
ALT: 23 U/L (ref 0–44)
AST: 26 U/L (ref 15–41)
Albumin: 2.5 g/dL — ABNORMAL LOW (ref 3.5–5.0)
Alkaline Phosphatase: 38 U/L (ref 38–126)
Anion gap: 4 — ABNORMAL LOW (ref 5–15)
BUN: 11 mg/dL (ref 6–20)
CO2: 24 mmol/L (ref 22–32)
Calcium: 8.3 mg/dL — ABNORMAL LOW (ref 8.9–10.3)
Chloride: 113 mmol/L — ABNORMAL HIGH (ref 98–111)
Creatinine, Ser: 1.16 mg/dL — ABNORMAL HIGH (ref 0.44–1.00)
GFR, Estimated: 55 mL/min — ABNORMAL LOW (ref >60)
Glucose, Bld: 99 mg/dL (ref 70–99)
Potassium: 3.5 mmol/L (ref 3.5–5.1)
Sodium: 141 mmol/L (ref 135–145)
Total Bilirubin: 0.4 mg/dL (ref 0.0–1.2)
Total Protein: 5.3 g/dL — ABNORMAL LOW (ref 6.5–8.1)

## 2023-05-25 LAB — PROCALCITONIN: Procalcitonin: <0.1 ng/mL

## 2023-05-25 LAB — PHOSPHORUS: Phosphorus: 4.2 mg/dL (ref 2.5–4.6)

## 2023-05-25 LAB — CK: Total CK: 698 U/L — ABNORMAL HIGH (ref 38–234)

## 2023-05-25 LAB — MAGNESIUM: Magnesium: 1.6 mg/dL — ABNORMAL LOW (ref 1.7–2.4)

## 2023-05-25 LAB — BRAIN NATRIURETIC PEPTIDE: B Natriuretic Peptide: 60.6 pg/mL (ref 0.0–100.0)

## 2023-05-25 LAB — C-REACTIVE PROTEIN: CRP: 4.4 mg/dL — ABNORMAL HIGH (ref <1.0)

## 2023-05-25 MED ORDER — POTASSIUM CHLORIDE CRYS ER 20 MEQ PO TBCR
40.0000 meq | EXTENDED_RELEASE_TABLET | Freq: Once | ORAL | Status: AC
  Administered 2023-05-25: 40 meq via ORAL
  Filled 2023-05-25: qty 2

## 2023-05-25 NOTE — Progress Notes (Signed)
 PROGRESS NOTE    Natalie Edwards  WUJ:[id number] DOB: December 18, 1965 DOA: 05/22/2023 PCP: [name], MD     Brief Narrative:  Natalie Edwards is a 57 y.o. female with medical history significant of HTN, GERD, anxiety and depression, Hx of SI with suicide attempts, hx of gastric ulcers, insomnia, leukocytosis who presents to ED brought in by family after patient was found down after attempt at self arm by taking extra doses of sedative home medications.  Poison control was contacted in the emergency department.  She was IVC in the ED.  Subjective: Patient in bed, appears comfortable, denies any headache, no fever, no chest pain or pressure, no shortness of breath , no abdominal pain. No new focal weakness.     Assessment & Plan:    Intentional overdose with toxic encephalopathy due to overdose from sedative medications. -CT head and cervical spine negative -Patient is currently under IVC.  Psychiatry consulted, per psych will require [facility] placement.  Continue sitter at bedside.  Currently not suicidal homicidal.  Medication effects seem to have resolved, initial talk screen negative, LFTs stable INR stable.  Cleared by poison control on 05/24/2023.  Patient and family members unsure as to what medications she took but according to the family members this is not the first time she has overdosed on her medications.  Unfortunately she is on multiple sedating medications which are as follows  Dilaudid, Prozac, Atarax, Remeron, Lyrica, Phenergan, Restoril, Risperdal, Deseril will request her psychiatrist and PCP to kindly note and cut down on sedating medications.  She is at very high risk for intentional or unintentional polypharmacy.    SIRS without clear source  -Chest x-ray clear, likely has UTI, responding well to Rocephin continue and follow cultures  AKI -Presented with creatinine 1.9, trended downward to 1.51 this morning -Continue IV fluid  Rhabdomyolysis -Presented with CK 3722, trend  stable and improving -Improving with IV fluids  Cocaine abuse -Was counseled to quit  Hypomagnesemia and hypophosphatemia -Replace   DVT prophylaxis:  heparin injection 5,000 Units Start: 05/23/23 0600  Code Status: Full code Family Communication: Discussed with cousin who found her Ms. Asencion Islam [phone]  on 05/25/2023 Disposition Plan: Per psychiatry Status is: Inpatient Remains inpatient appropriate because: Will require [facility]   Objective: Vitals:   05/24/23 1954 05/25/23 0000 05/25/23 0255 05/25/23 0830  BP: (!) 112/59  (!) 143/54 (!) 122/54  Pulse:  (!) 104 79   Resp: 16   18  Temp:      TempSrc:  Oral Oral   SpO2: 96% 97% 94% 97%    Intake/Output Summary (Last 24 hours) at 05/25/2023 0943 Last data filed at 05/25/2023 0900 Gross per 24 hour  Intake 698.14 ml  Output 700 ml  Net -1.86 ml   There were no vitals filed for this visit.  Examination:   Awake Alert, No new F.N deficits, Normal affect [place].AT,PERRAL Supple Neck, No JVD,   Symmetrical Chest wall movement, Good air movement bilaterally, CTAB RRR,No Gallops, Rubs or new Murmurs,  +ve B.Sounds, Abd Soft, No tenderness,   No Cyanosis, Clubbing or edema    Data Reviewed: I have personally reviewed following labs and imaging studies     Recent Results (from the past 240 hours)  Blood culture (routine x 2)     Status: None (Preliminary result)   Collection Time: 05/22/23 10:30 PM   Specimen: BLOOD RIGHT HAND  Result Value Ref Range Status   Specimen Description BLOOD RIGHT HAND  Final   Special  Requests   Final    BOTTLES DRAWN AEROBIC ONLY Blood Culture results may not be optimal due to an inadequate volume of blood received in culture bottles   Culture   Final    NO GROWTH 3 DAYS Performed at [facility], [address] [place], [place] [address]    Report Status PENDING  Incomplete  Blood culture (routine x 2)     Status: None (Preliminary result)   Collection Time: 05/22/23 11:04 PM    Specimen: BLOOD LEFT HAND  Result Value Ref Range Status   Specimen Description BLOOD LEFT HAND  Final   Special Requests   Final    BOTTLES DRAWN AEROBIC AND ANAEROBIC Blood Culture adequate volume   Culture   Final    NO GROWTH 3 DAYS Performed at [facility], [address] [place], [place] [address]    Report Status PENDING  Incomplete  Urine Culture     Status: Abnormal (Preliminary result)   Collection Time: 05/23/23 11:52 AM   Specimen: Urine, Clean Catch  Result Value Ref Range Status   Specimen Description URINE, CLEAN CATCH  Final   Special Requests NONE  Final   Culture (A)  Final    >=100,000 COLONIES/mL GRAM NEGATIVE RODS CULTURE REINCUBATED FOR BETTER GROWTH Performed at [facility], [address] [place], [place] [address]    Report Status PENDING  Incomplete  MRSA Next Gen by PCR, Nasal     Status: None   Collection Time: 05/24/23  5:36 AM   Specimen: Nasal Mucosa; Nasal Swab  Result Value Ref Range Status   MRSA by PCR Next Gen NOT DETECTED NOT DETECTED Final    Comment: (NOTE) The GeneXpert MRSA Assay (FDA approved for NASAL specimens only), is one component of a comprehensive MRSA colonization surveillance program. It is not intended to diagnose MRSA infection nor to guide or monitor treatment for MRSA infections. Test performance is not FDA approved in patients less than 67 years old. Performed at [facility], [address], [place], [place] [address]       Data Review:   Inpatient Medications  Scheduled Meds:  heparin  5,000 Units Subcutaneous Q8H   mirtazapine  30 mg Oral QHS   pantoprazole  40 mg Oral Daily   Continuous Infusions:  cefTRIAXone (ROCEPHIN)  IV 1 g (05/24/23 2304)   PRN Meds:.acetaminophen, albuterol, naphazoline-glycerin, ondansetron **OR** ondansetron (ZOFRAN) IV, traMADol  DVT Prophylaxis  heparin injection 5,000 Units Start: 05/23/23 0600   Recent Labs  Lab 05/22/23 2004 05/23/23 0850  05/24/23 0616 05/25/23 0513  WBC 24.8* 21.2* 17.6* 14.2*  HGB 13.2 11.3* 10.4* 9.9*  HCT 39.3 34.0* 31.7* 30.1*  PLT 473* 433* 397 409*  MCV 82.7 82.7 84.3 83.4  MCH 27.8 27.5 27.7 27.4  MCHC 33.6 33.2 32.8 32.9  RDW 17.5* 16.9* 17.2* 16.8*  LYMPHSABS 4.0  --   --  6.6*  MONOABS 2.1*  --   --  0.9  EOSABS 0.0  --   --  0.3  BASOSABS 0.0  --   --  0.0    Recent Labs  Lab 05/22/23 2004 05/22/23 2239 05/22/23 2350 05/22/23 2357 05/23/23 0850 05/24/23 0616 05/25/23 0513  NA 138  --  142  --  143 140 141  K 4.5  --  3.8  --  3.9 3.5 3.5  CL 105  --  110  --  112* 113* 113*  CO2 15*  --  16*  --  20* 18* 24  ANIONGAP 18*  --  16*  --  11 9 4*  GLUCOSE 110*  --  86  --  113* 90 99  BUN 49*  --  40*  --  31* 16 11  CREATININE 1.90*  --  1.59*  --  1.51* 1.08* 1.16*  AST 64*  --  49*  --  44* 29 26  ALT 36  --  27  --  28 23 23   ALKPHOS 61  --  47  --  46 42 38  BILITOT 1.8*  --  1.6*  --  1.4* 0.6 0.4  ALBUMIN 3.7  --  2.8*  --  2.9* 2.5* 2.5*  CRP  --   --   --   --   --  6.7* 4.4*  PROCALCITON  --   --   --   --   --  <0.10 <0.10  LATICACIDVEN  --  2.4*  --  0.6  --   --   --   INR  --   --   --   --   --  1.1  --   TSH  --   --  0.405  --   --   --   --   HGBA1C 5.2  --   --   --   --   --   --   AMMONIA  --   --   --   --   --  26  --   BNP  --   --   --   --   --  52.2 60.6  MG 1.9  --  1.6*  --   --  1.8 1.6*  PHOS  --   --  2.1*  --   --  2.4* 4.2  CALCIUM 9.2  --  8.2*  --  8.6* 8.3* 8.3*      Recent Labs  Lab 05/22/23 2004 05/22/23 2239 05/22/23 2350 05/22/23 2357 05/23/23 0850 05/24/23 0616 05/25/23 0513  CRP  --   --   --   --   --  6.7* 4.4*  PROCALCITON  --   --   --   --   --  <0.10 <0.10  LATICACIDVEN  --  2.4*  --  0.6  --   --   --   INR  --   --   --   --   --  1.1  --   TSH  --   --  0.405  --   --   --   --   HGBA1C 5.2  --   --   --   --   --   --   AMMONIA  --   --   --   --   --  26  --   BNP  --   --   --   --   --  52.2 60.6  MG  1.9  --  1.6*  --   --  1.8 1.6*  CALCIUM 9.2  --  8.2*  --  8.6* 8.3* 8.3*    --------------------------------------------------------------------------------------------------------------- Lab Results  Component Value Date   CHOL 202 (H) 12/13/2021   HDL 76 12/13/2021   LDLCALC 106 (H) 12/13/2021   TRIG 113 12/13/2021   CHOLHDL 2.7 12/13/2021    Lab Results  Component Value Date   HGBA1C 5.2 05/22/2023   Recent Labs    05/22/23  2350  TSH 0.405   No results for input(s): "VITAMINB12", "FOLATE", "FERRITIN", "TIBC", "IRON", "RETICCTPCT" in the last 72 hours. ------------------------------------------------------------------------------------------------------------------ Cardiac Enzymes No results for input(s): "CKMB", "TROPONINI", "MYOGLOBIN" in the last 168 hours.  Invalid input(s): "CK"  Micro Results Recent Results (from the past 240 hours)  Blood culture (routine x 2)     Status: None (Preliminary result)   Collection Time: 05/22/23 10:30 PM   Specimen: BLOOD RIGHT HAND  Result Value Ref Range Status   Specimen Description BLOOD RIGHT HAND  Final   Special Requests   Final    BOTTLES DRAWN AEROBIC ONLY Blood Culture results may not be optimal due to an inadequate volume of blood received in culture bottles   Culture   Final    NO GROWTH 3 DAYS Performed at [facility], [address] [place], [place] [address]    Report Status PENDING  Incomplete  Blood culture (routine x 2)     Status: None (Preliminary result)   Collection Time: 05/22/23 11:04 PM   Specimen: BLOOD LEFT HAND  Result Value Ref Range Status   Specimen Description BLOOD LEFT HAND  Final   Special Requests   Final    BOTTLES DRAWN AEROBIC AND ANAEROBIC Blood Culture adequate volume   Culture   Final    NO GROWTH 3 DAYS Performed at [facility], [address], [place], [place] [address]    Report Status PENDING  Incomplete  Urine Culture     Status: Abnormal (Preliminary result)    Collection Time: 05/23/23 11:52 AM   Specimen: Urine, Clean Catch  Result Value Ref Range Status   Specimen Description URINE, CLEAN CATCH  Final   Special Requests NONE  Final   Culture (A)  Final    >=100,000 COLONIES/mL GRAM NEGATIVE RODS CULTURE REINCUBATED FOR BETTER GROWTH Performed at [facility], [address] [place], [place] [address]    Report Status PENDING  Incomplete  MRSA Next Gen by PCR, Nasal     Status: None   Collection Time: 05/24/23  5:36 AM   Specimen: Nasal Mucosa; Nasal Swab  Result Value Ref Range Status   MRSA by PCR Next Gen NOT DETECTED NOT DETECTED Final    Comment: (NOTE) The GeneXpert MRSA Assay (FDA approved for NASAL specimens only), is one component of a comprehensive MRSA colonization surveillance program. It is not intended to diagnose MRSA infection nor to guide or monitor treatment for MRSA infections. Test performance is not FDA approved in patients less than 60 years old. Performed at [facility], [address], [place], [place] [address]     Radiology Reports  DG Chest [place] 1 View Result Date: 05/24/2023 CLINICAL DATA:  Shortness of breath. EXAM: PORTABLE CHEST 1 VIEW COMPARISON:  05/22/2023 FINDINGS: The lungs are clear without focal pneumonia, edema, pneumothorax or pleural effusion. Cardiopericardial silhouette is at upper limits of normal for size. No acute bony abnormality. Telemetry leads overlie the chest. IMPRESSION: No active disease. Electronically Signed   By: Kennith Center M.D.   On: 05/24/2023 08:00      Signature  -   Susa Raring M.D on 05/25/2023 at 9:43 AM   -  To page go to [email or web address]

## 2023-05-25 NOTE — Plan of Care (Signed)
  Problem: Education: Goal: Knowledge of General Education information will improve Description: Including pain rating scale, medication(s)/side effects and non-pharmacologic comfort measures Outcome: Progressing   Problem: Clinical Measurements: Goal: Ability to maintain clinical measurements within normal limits will improve Outcome: Progressing Goal: Will remain free from infection Outcome: Progressing Goal: Diagnostic test results will improve Outcome: Progressing Goal: Respiratory complications will improve Outcome: Progressing Goal: Cardiovascular complication will be avoided Outcome: Progressing   Problem: Activity: Goal: Risk for activity intolerance will decrease Outcome: Progressing   Problem: Nutrition: Goal: Adequate nutrition will be maintained Outcome: Progressing   Problem: Elimination: Goal: Will not experience complications related to bowel motility Outcome: Progressing Goal: Will not experience complications related to urinary retention Outcome: Progressing   Problem: Pain Managment: Goal: General experience of comfort will improve and/or be controlled Outcome: Progressing   Problem: Safety: Goal: Ability to remain free from injury will improve Outcome: Progressing   Problem: Skin Integrity: Goal: Risk for impaired skin integrity will decrease Outcome: Progressing

## 2023-05-25 NOTE — Progress Notes (Signed)
 Mobility Specialist Progress Note:    05/25/23 1223  Mobility  Activity Ambulated with assistance in hallway  Level of Assistance Contact guard assist, steadying assist  Assistive Device Front wheel walker  Distance Ambulated (ft) 150 ft  Activity Response Tolerated well  Mobility Referral Yes  Mobility visit 1 Mobility  Mobility Specialist Start Time (ACUTE ONLY) 1050  Mobility Specialist Stop Time (ACUTE ONLY) 1103  Mobility Specialist Time Calculation (min) (ACUTE ONLY) 13 min   Received pt seated OEB having no complaints and agreeable to mobility. Pt was asymptomatic throughout ambulation and returned to room w/o fault. Left seated EOB w/ call bell in reach and all needs met.   Thompson Grayer Mobility Specialist  Please contact vis Secure Chat or  Rehab Office [phone]

## 2023-05-25 NOTE — TOC CM/SW Note (Signed)
 Transition of Care [facility]) - Inpatient Brief Assessment   Patient Details  Name: Kynlea Blackston MRN: [id number] Date of Birth: April 16, 1965  Transition of Care [facility]) CM/SW Contact:    Mearl Latin, LCSW Phone Number: 05/25/2023, 3:22 PM   Clinical Narrative: CSW following for admission to [facility] tomorrow per psych and MD. Patient is IVC'd.   Transition of Care Asessment: Insurance and Status: Insurance coverage has been reviewed Patient has primary care physician: Yes Home environment has been reviewed: From home Prior level of function:: Independent Prior/Current Home Services: No current home services Social Drivers of Health Review: SDOH reviewed interventions complete Readmission risk has been reviewed: Yes Transition of care needs: transition of care needs identified, TOC will continue to follow

## 2023-05-25 NOTE — Discharge Instructions (Signed)
 In a time of Crisis: [facility]  Mobile Crisis Management provides immediate crisis response, 24/7.  Call [phone]  [facility] for MH/DD/[facility] is available 24 hours a day, 7 days a week. Customer Service Specialists will assist you to find a crisis provider that is well-matched with your needs. Your local number is: [phone]  [facility]/Behavioral Health Urgent Care (BHUC) IOP, individual counseling, medication management [address] [place], [place] [address] [phone] Call for intake hours; Medicaid and Uninsured    Substance Use Outpatient Providers  [facility] (ADS) Group and individual counseling. [address]  [place], [place] [address] [phone] [place]: [phone]  [place]: [phone] Medicaid and uninsured.   [facility] Offers IOP groups multiple times per week. [address] Sherian Mar[place], [place] [address] [phone] Takes Medicaid and other insurances.   [facility]  Chemical Dependency Intensive Outpatient Program (IOP) [address] [place], [place] [address] [phone] Takes Nurse, learning disability and [place].   [facility]  IOP and Partial Hospitalization Program  [address]  [place], [place] [address] [phone] Private Insurance, [place] only for partial hospitalization  [facility] [place] [address], [place], [place] [address] [phone] Monday-Friday. Short and Long term options.  [facility]/Behavioral Health Urgent Care (BHUC) IOP, individual counseling, medication management [address] [place], [place] [address] [phone] Medicaid and [facility]  [facility] [address]  [place], [place] [address] [phone] Private Insurance and Self Pay   [facility] Outpatient [address]  [place], [place] [address] [phone] Private Insurance, [place], and Self Pay   [facility]: Methadone Clinic  [address] [place], [place] [address] [facility]   [address]  [place], [place] [address] [phone]  [facility]  [address] [place], [place] [address] [phone]      Residential Treatment Programs  [facility] [address] [place], [place] [address] [phone] or [phone] Detox and Residential Rehab 21 days (Medicaid, private insurance, and self pay. If Medicare, will look into funding). No methadone. Call for pre-screen.   [facility]) [address]  [place], [place] [address] [phone] Detox 3-7 days (self Pay and Medicaid Limited availability). Transitional Program for females needs 60 days clean first.  Rehab Only for Males (Medicare, Medicaid, and Self Pay)-No methadone.  [facility] [address] [place], [place] [address] [phone] or [phone] Private Insurance only  [facility] PHONE: [phone] FAX: [phone] Residential program for women 21 and over for up to a year through a Christian 12-step recovery model. Self-pay.    [facility] [address] [place], [place] [address] Phone:  [phone] Must be detoxed 72 hours prior to admission; 28 day program.  Self-pay.  [facility] [address]  [place], [place] [phone] [employer], Medicare, [place] (not straight [place]). They offer assistance with transportation.   [facility] [address] [name],  [place], [place] [address] [phone] Christian Based Program. Men only. No insurance  [employer] is a substance use disorder treatment program for women, including those who are pregnant, parenting, and/or whose lives have been touched by abuse and violence. [phone]  [phone]  [facility] [address]  [place], [place] [address] Women's: [phone] Men's: [phone] No Medicaid.   Addiction Centers of [place] Locations across the [place] (mainly [place]) willing to help with transportation.  [phone] [employer]. [facility]  [address] [place]  [place], [place] [address] [phone] Treatment Only, must make assessment appointment, and must be sober for assessment appointment. Self pay, [facility], must be [facility] resident. No methadone.   [facility]  [address] [place], [place] [address] [phone] No pending legal charges, Long-term work program. No methadone. Call for assessment.  [facility]  [address], [place], [place] [address] [phone] or [phone] Commercial Insurance Only  [facility] - [phone] Private Insurance (no [place]). Males/Females, call to make referrals, multiple facilities.   [facility] Women[facility]: [facility] [address] [place], [place] [address] [phone]  [facility]-no methadone: [facility] [address] [place], [place] [address] [phone] Lacy Duverney [facility] [phone] [place], [place] For women, houses 8 residents for sober living. No Medicaid.         AA Meetings Website to locate meetings (virtually or in person): [email or web address].[email or web address].biz/ Phone: [phone]  Syringe Services Program: Due to COVID-19, syringe services programs are likely operating under different hours with limited or no fixed site hours. Some programs may not be operating at all. Please contact the program directly using the phone numbers provided below to see if they are still operating under COVID-19. [facility] Solution to the Opioid Problem (GCSTOP) Fixed;  mobile; peer-based;Bobbye Riggs) [phone] [email or web address] Fixed site exchange at [facility], [address] [place], [place] [address] on Wednesdays (2:00 - 5:00 pm) and Thursdays (4:00 - 8:00 pm). Pop-up mobile exchange locations: [employer] and [employer], [address] [place], [place] [address] on Tuesdays (11:00 am - 1:00 pm) and Fridays (11:00 am - 1:00 pm) -[facility] - [address], [place], [place] [address] on Tuesdays (2:00 - 4:00 pm) and Fridays (2:00 - 4:00 pm) -[place] Survivors Publishing copy - also serves Radio broadcast assistant and [employer] [place] [employer];Fixed; mobile; peer-based; Lendon Ka [phone] [email or web address] [address] [place], [place] [address] Delivery and outreach available in [place] and [place], please call for more information. Monday, Tuesday: 1:00 -7:00 pm, Thursday: 4:00 pm - 8:00 pm, Friday: 1:00 pm - 8:00 pm)  Medication-Assisted Treatment (MAT):  -[place]- services [address] and surrounding areas including [place], [place], [place], [place], [address], [place], [place], [place], [place], and [place], [place]. Options include Methadone, buprenorphine or Suboxone. [address], [place], [place] [address] Phone: [phone] Mon - Fri: 5:30am - 2:00pm Sat: 5:30am -7:30am Sun: Closed Holidays: 6:00am - 8:00am  -[facility] [place]- We use FDA-approved medications, like methadone/suboxone/sublocade, and vivitrol. These medications are then combined with customized care plans that include individual or group counseling, toxicology, and medical care directed by on-site physicians. Accepts most insurance plans, Medicaid, and private pay.  [address] [place], [place] [address] Phone: [phone] Monday-Friday 5:00 AM - 10:00 AM Saturday 6:00 AM - 8:30 AM Sunday 6:00 AM - 7:00 AM  -[facility]- ADS is a treatment & recovery focused  program. In addition to receiving methadone medication, our clients participate in individual and group counseling as well as random drug testing. If accepted into the ADS Opioid Program, you will be provided several intake appointments and a physical exam  [address], [place] [address] Office: [phone]  Fax: [phone]  -[facility]- We put our community members at the center of everything we do, for remote treatment services as well as in-person, from alcohol withdrawal to opioid use and more.  [address] [place] [address], [place], [place] [address] [phone] Monday-Wednesday: 9:00am - 5:00pm Thursday: 9:00am - 6:00pm Friday: 9:00am - 5:00pm Saturday: 9:00am - 1:00pm Sunday: Closed  -[facility] [place]) [address] [place], [place] [address] [phone]  [place] [phone] [address] [place], [place] [address]  M-W    5:00am-12:00pm Thu     5:00am-10:00am Fri       5:00am-12:00pm Sat      5:00am-8:00am Sun     Closed  $12/daily for Methadone Treatment.      [employer] assistance programs Crisis assistance programs  -[facility] Arts development officer. If you are experiencing homelessness in [place], [place], your first point of contact should be Pensions consultant. You can reach Coordinated Entry by calling ([phone] or by emailing [email or web address].  Community access points: [employer] [phone] [address], [place]) every Tuesday from 9am-10am. [facility] ([address] [place], [place]) every Wednesday from 8am-9am.   - Coordinated Re-entry Marcy Panning: Dial 211 and request. Offers referrals to homeless shelters in the area.    -The [employer] [phone]) offers several services to local families, as funding allows. The Emergency Assistance Program (EAP), which they administer, provides household goods,  free food, clothing, and financial aid to people in need in the [facility] area. The EAP program does have some qualification, and counselors will interview clients for financial assistance by written referral only. Referrals need to be made by the Department of Social Services or by other EAP approved human services agencies or charities in the area.  -[facility]ries of [employer], which can be reached at [phone], offers emergency assistance programs for those in need of help, such as food, rent assistance, a soup kitchen, shelter, and clothing. They are based in [facility] but provide a number of services to those that qualify for assistance.   [facility] Department of Social Services may be able to offer temporary financial assistance and cash grants for paying rent and utilities, Help may be provided for local county residents who may be experiencing personal crisis when other resources, including government programs, are not available. Call [phone]  -High [employer] Army is a [employer] agency, The organization can offer emergency assistance for paying rent, Caremark Rx, utilities, food, household products and furniture. They offer extensive emergency and transitional housing for families, children and single women, and also run a Boy's and D[employer] Food. Thrift Shops, Secondary school teacher, and other aid offered too. [address], [place], [place] [address], [phone]  -[place] Low Income Energy Assistance Program -- This is offered for [facility] families. The federal government created [employer] Program provides a one-time cash grant payment to help eligible low-income families pay their electric and heating bills. [address] [place], [place] [address], [phone]  -[place] Emergency Assistance -- A program offers emergency utility and rent funds for greater [employer] area residents. The program can also provide counseling and referrals to charities and government programs. Also provides food and a free meal program that serves lunch Mondays - Saturdays and dinner seven days per week  to individuals in the community. [address], [place], [place] [address], [phone]  -[employer] - Offers affordable apartment and housing communities across      [place]. The low income and seniors can access public housing, rental assistance to qualified applicants, and apply for the section 8 rent subsidy program. Other programs include Chiropractor and Engineer, maintenance. [address] [place], [place] [address], dial [phone].  -[facility] provides transitional housing to veterans and the disabled. Clients will also access other services too, including assistance in applying for Disability, life skills classes, case management, and assistance in finding permanent housing. [address] [place], [place] [address], call [phone]  -[facility] [place] Transitional Housing through [employer] is for people who were just evicted or that are formerly homeless. The non-profit will also help then gain self-sufficiency, find a home or apartment to live in, and also provides information on rent assistance when needed. Phone [phone]  -The [place] [facility] helps low income, elderly, or disabled residents in seven counties in the [place] ([place], [place], [place], [place], [place], [place], [place], and [place]) save energy and reduce their utility bills by improving energy efficiency. Phone [phone].  -[employer] is located in the [place] Housing Hub in the [employer], [address], [place], [place] [address]. Parking is  in the rear of the building. Phone: [phone]   General Email: info@gsohc .org  GHC provides free housing counseling assistance in locating affordable rental housing or housing with support services for families and individuals in crisis and the chronically homeless. We provide potential resources for other housing needs like utilities. Our trained counselors also work with clients on budgeting and financial literacy in effort to empower them to take control of their financial situations. [employer] collaborates with homeless service providers and other stakeholders as part of the [employer] COC (Continuum of Care). The (COC) is a regional/local planning body that coordinates housing and services funding for homeless families and individuals. The role of [facility] in the COC is through housing counseling to work with people we serve on diversion strategies for those that are at imminent risk of becoming homeless. We also work with the Coordinated Assessment/Entry Specialist who attempts to find temporary solutions and/or connects the people to Housing First, Rapid Re-housing or transitional housing programs. Our Homelessness Prevention Housing Counselors meet with clients on business days (Monday-Fridays, except scheduled holidays) from 8:30 am to 4:30 pm.  Legal assistance for evictions, foreclosure, and more -If you need free legal advice on civil issues, such as foreclosures, evictions, Electronics engineer, government programs, domestic issues and more, Armed forces operational officer Aid of [place] [facility]) is a Associate Professor firm that provides free legal services and counsel to lower income people, seniors, disabled, and others, The goal is to ensure everyone has access to justice and fair representation. Call them at [phone].  [facility] for Housing and Community Studies can provide info about obtaining legal assistance with evictions. Phone [phone].  Data processing manager  The [employer]. offers job and Dispensing optician. Resources are focused on helping students obtain the skills and experiences that are necessary to compete in today's challenging and tight job market. The non-profit faith-based community action agency offers internship trainings as well as classroom instruction. Classes are tailored to meet the needs of people in the [facility] region. [place], [place] [address], (  [phone]  Foreclosure prevention/Debt Services Family Services of the [employer] Credit Counseling Service inludes debt and foreclosure prevention programs for local families. This includes money management, financial advice, budget review and development of a written action plan with a Pensions consultant to help solve specific individual financial problems. In addition, housing and mortgage counselors can also provide pre- and post-purchase homeownership counseling, default resolution counseling (to prevent foreclosure) and reverse mortgage counseling. A Debt Management Program allows people and families with a high level of credit card or medical debt to consolidate and repay consumer debt and loans to creditors and rebuild positive credit ratings and scores. Contact ([phone].  Community clinics in [place] -Health Department [facility] Clinic: [address] [place], [place], [address]. [phone].  -Health Department [place] Clinic: [address], [facility], [address]. [phone].  -[facility] offers medical care through a group of doctors, pharmacies and other healthcare related agencies that offer services for low income, uninsured adults in [place]. Also offers adult Dental care and assistance with applying for an [employer]. Call [phone].   [facility]. This center provides low-cost health care to those without health insurance.  Services offered include an onsite pharmacy. Phone [phone]. [address], [place].  -Medication Assistance Program serves as a link between pharmaceutical companies and patients to provide low cost or free prescription medications. This service is available for residents who meet certain income restrictions and have no insurance coverage. PLEASE CALL [phone] Ginette Otto) OR [phone] ([place])  -One Step Further: Materials engineer, The [employer] Support & Nutrition Program, [employer]. Call [phone]/ [phone].  Food pantry and assistance -Urban Ministry-Food Bank: [address], [place] [address]. Phone [phone]  -Blessed Table Food Pantry: [address], [place], [place] [address]. [phone].  -Missionary Ministry: has the purpose of visiting the sick and shut-ins and provide for needs in the surrounding communities. Call ([phone]. Email: [email or web address] [email or web address] This program provides: Food box for seniors, Financial assistance, Food to meet basic nutritional needs.  -Meals on Wheels with Senior Resources: [facility] residents age 40 and over who are homebound and unable to obtain and prepare a nutritious meal for themselves are eligible for this service. There may be a waiting list in certain parts of [facility] if the route in that area is full. If you are in [facility] and [place] call [phone] to register. For all other areas call [phone] to register.  -Greater Dietitian: https://findfood.BargainContractor.si  TRANSPORTATION: -[employer] Us Department of Health: Call [facility] and [employer] at ([phone] for details. AttractionGuides.es  -Access [place]: Access [place] is the [employer] Agency's shared-ride transportation service for eligible riders who have  a disability that prevents them from riding the fixed route bus. Call [phone]. Access [place] riders must pay a fare of $1.50 per trip, or may purchase a 10-ride punch card for $14.00 ($1.40 per ride) or a 40-ride punch card for $48.00 ($1.20 per ride).  -The Shepherd's WHEELS rideshare transportation service is provided for senior citizens (60+) who live independently within [place] city limits and are unable to drive or have limited access to transportation. Call [phone] to schedule an appointment.  -[place] Transportation: For Medicare or Medicaid recipients call [phone]?[name], wheelchair Zenaida Niece, and ambulatory quotes available.   FLEEING VIOLENCE: -Family Services of the [place]- 24/7 Crisis line (320)416-4399) -  [facility] Family Justice Centers: (757)649-1701) 641-SAFE 208-064-2965)  Vista 2-1-1 is another useful way to locate resources in the community. Visit ShedSizes.ch to find service information online. If you need additional assistance, 2-1-1 Referral Specialists are available 24 hours a day, every day by dialing 2-1-1 or [phone] from any phone. The call is free, confidential, and available in any language.  Affordable Housing Search http://www.nchousingsearch.org  [facility])   M-F 8a-3p [address] [place], [place] [address] [phone] Services include: laundry, barbering, support groups, case management, phone & computer access, showers, AA/NA mtgs, mental health/substance abuse nurse, job skills class, disability information, VA assistance, spiritual classes, etc. Winter Shelter available when temperatures are less than 32 degrees.   HOMELESS SHELTERS [facility] Night Shelter at [facility]- Call [phone] ext. 347 or ext. 336. Located at [address] [place], [place] [address]  [facility] Mens Shelter- Call [phone]. Located at [address], [place] [address].  Leslie's  House- [employer]. Call [phone]. Office located at [address] [place] [address].  [facility] Family Housing through [place] [phone].  [facility] Family Shel[facility]- Call [phone]. Located at [address] [place], [place], [place] [address].  Room at the Inn-For Pregnant mothers. Call [phone]. Located at [address] [place], [address].  [facility]-For men in [place]. Call [phone]. [address]-Shelter in [place]. Call [phone].  Home of [employer] for [employer] [phone]. Office located at [address], [place], [address].  [employer] be agreeable to help with chores. Call [phone] ext. [phone].  Men's: [address] [place], [place] [address]. Women's: [facility]  [address] [place], [place] [address]  Crisis Services [facility] Mobile Crisis Management- [phone]  [facility] [address] [place], [place] [address]. Phone: [phone]   Follow with Primary MD [name], MD in 7 days   Get CBC, CMP, 2 view Chest X ray -  checked next visit with your primary MD or [facility] MD   Activity: As tolerated with Full fall precautions use walker/cane & assistance as needed  Dis[facility]  Diet: Heart Healthy   Special Instructions: If you have smoked or chewed Tobacco  in the last 2 yrs please stop smoking, stop any regular Alcohol  and or any Recreational drug use.  On your next visit with your primary care physician please Get Medicines reviewed and adjusted.  Please request your Prim.MD to go over all Hospital Tests and Procedure/Radiological results at the follow up, please get all Hospital records sent to your Prim MD by signing hospital release before you go home.  If you experience worsening of your admission symptoms, develop shortness of breath, life threatening emergency, suicidal or homicidal  thoughts you must seek medical attention immediately by calling 911 or calling your MD immediately  if symptoms less severe.  You Must read complete instructions/literature along with all the possible adverse reactions/side effects for all the Medicines you take and that have been prescribed to you. Take any new Medicines after you have completely understood and accpet all the possible adverse reactions/side effects.   Do not drive when taking Pain medications.  Do not take more than prescribed Pain, Sleep and Anxiety Medications  Wear Seat belts while driving.

## 2023-05-26 ENCOUNTER — Inpatient Hospital Stay
Admission: AD | Admit: 2023-05-26 | Discharge: 2023-06-11 | DRG: 885 | Disposition: A | Discharge status: Home / Self Care | Source: Intra-hospital | Attending: Psychiatry | Admitting: Psychiatry

## 2023-05-26 ENCOUNTER — Encounter: Payer: Self-pay | Admitting: Family

## 2023-05-26 ENCOUNTER — Other Ambulatory Visit: Payer: Self-pay

## 2023-05-26 ENCOUNTER — Other Ambulatory Visit (HOSPITAL_COMMUNITY): Payer: Self-pay

## 2023-05-26 DIAGNOSIS — Z9152 Personal history of nonsuicidal self-harm: Secondary | ICD-10-CM

## 2023-05-26 DIAGNOSIS — K219 Gastro-esophageal reflux disease without esophagitis: Secondary | ICD-10-CM | POA: Diagnosis present

## 2023-05-26 DIAGNOSIS — Z87442 Personal history of urinary calculi: Secondary | ICD-10-CM

## 2023-05-26 DIAGNOSIS — Z79899 Other long term (current) drug therapy: Secondary | ICD-10-CM | POA: Diagnosis not present

## 2023-05-26 DIAGNOSIS — F410 Panic disorder [episodic paroxysmal anxiety] without agoraphobia: Secondary | ICD-10-CM | POA: Diagnosis present

## 2023-05-26 DIAGNOSIS — Z604 Social exclusion and rejection: Secondary | ICD-10-CM | POA: Diagnosis present

## 2023-05-26 DIAGNOSIS — M79606 Pain in leg, unspecified: Secondary | ICD-10-CM

## 2023-05-26 DIAGNOSIS — F17210 Nicotine dependence, cigarettes, uncomplicated: Secondary | ICD-10-CM | POA: Diagnosis present

## 2023-05-26 DIAGNOSIS — M7989 Other specified soft tissue disorders: Secondary | ICD-10-CM | POA: Diagnosis present

## 2023-05-26 DIAGNOSIS — I4891 Unspecified atrial fibrillation: Secondary | ICD-10-CM | POA: Diagnosis present

## 2023-05-26 DIAGNOSIS — Z5941 Food insecurity: Secondary | ICD-10-CM

## 2023-05-26 DIAGNOSIS — F1410 Cocaine abuse, uncomplicated: Secondary | ICD-10-CM | POA: Diagnosis present

## 2023-05-26 DIAGNOSIS — F411 Generalized anxiety disorder: Secondary | ICD-10-CM | POA: Diagnosis present

## 2023-05-26 DIAGNOSIS — Z9151 Personal history of suicidal behavior: Secondary | ICD-10-CM | POA: Diagnosis not present

## 2023-05-26 DIAGNOSIS — Z5982 Transportation insecurity: Secondary | ICD-10-CM | POA: Diagnosis not present

## 2023-05-26 DIAGNOSIS — Z888 Allergy status to other drugs, medicaments and biological substances status: Secondary | ICD-10-CM

## 2023-05-26 DIAGNOSIS — Z5900 Homelessness unspecified: Secondary | ICD-10-CM

## 2023-05-26 DIAGNOSIS — F332 Major depressive disorder, recurrent severe without psychotic features: Principal | ICD-10-CM | POA: Diagnosis present

## 2023-05-26 DIAGNOSIS — Z90710 Acquired absence of both cervix and uterus: Secondary | ICD-10-CM

## 2023-05-26 DIAGNOSIS — R45851 Suicidal ideations: Secondary | ICD-10-CM | POA: Diagnosis present

## 2023-05-26 DIAGNOSIS — F1910 Other psychoactive substance abuse, uncomplicated: Secondary | ICD-10-CM | POA: Diagnosis not present

## 2023-05-26 DIAGNOSIS — Z634 Disappearance and death of family member: Secondary | ICD-10-CM

## 2023-05-26 DIAGNOSIS — I10 Essential (primary) hypertension: Secondary | ICD-10-CM | POA: Diagnosis present

## 2023-05-26 DIAGNOSIS — I517 Cardiomegaly: Secondary | ICD-10-CM | POA: Diagnosis not present

## 2023-05-26 DIAGNOSIS — T6592XS Toxic effect of unspecified substance, intentional self-harm, sequela: Secondary | ICD-10-CM | POA: Diagnosis not present

## 2023-05-26 DIAGNOSIS — G4700 Insomnia, unspecified: Secondary | ICD-10-CM | POA: Diagnosis present

## 2023-05-26 DIAGNOSIS — Z8711 Personal history of peptic ulcer disease: Secondary | ICD-10-CM | POA: Diagnosis not present

## 2023-05-26 LAB — CBC WITH DIFFERENTIAL/PLATELET
Abs Immature Granulocytes: 0.08 10*3/uL — ABNORMAL HIGH (ref 0.00–0.07)
Basophils Absolute: 0 10*3/uL (ref 0.0–0.1)
Basophils Relative: 0 %
Eosinophils Absolute: 0.3 10*3/uL (ref 0.0–0.5)
Eosinophils Relative: 2 %
HCT: 29.9 % — ABNORMAL LOW (ref 36.0–46.0)
Hemoglobin: 10.1 g/dL — ABNORMAL LOW (ref 12.0–15.0)
Immature Granulocytes: 1 %
Lymphocytes Relative: 46 %
Lymphs Abs: 6.2 10*3/uL — ABNORMAL HIGH (ref 0.7–4.0)
MCH: 27.4 pg (ref 26.0–34.0)
MCHC: 33.8 g/dL (ref 30.0–36.0)
MCV: 81.3 fL (ref 80.0–100.0)
Monocytes Absolute: 1.2 10*3/uL — ABNORMAL HIGH (ref 0.1–1.0)
Monocytes Relative: 9 %
Neutro Abs: 5.7 10*3/uL (ref 1.7–7.7)
Neutrophils Relative %: 42 %
Platelets: 431 10*3/uL — ABNORMAL HIGH (ref 150–400)
RBC: 3.68 MIL/uL — ABNORMAL LOW (ref 3.87–5.11)
RDW: 16.7 % — ABNORMAL HIGH (ref 11.5–15.5)
WBC: 13.5 10*3/uL — ABNORMAL HIGH (ref 4.0–10.5)
nRBC: 0 % (ref 0.0–0.2)

## 2023-05-26 LAB — URINE CULTURE: Culture: >=100000 — AB

## 2023-05-26 LAB — BASIC METABOLIC PANEL
Anion gap: 4 — ABNORMAL LOW (ref 5–15)
BUN: 10 mg/dL (ref 6–20)
CO2: 22 mmol/L (ref 22–32)
Calcium: 8.3 mg/dL — ABNORMAL LOW (ref 8.9–10.3)
Chloride: 110 mmol/L (ref 98–111)
Creatinine, Ser: 1.04 mg/dL — ABNORMAL HIGH (ref 0.44–1.00)
GFR, Estimated: >60 mL/min (ref >60)
Glucose, Bld: 98 mg/dL (ref 70–99)
Potassium: 4.1 mmol/L (ref 3.5–5.1)
Sodium: 136 mmol/L (ref 135–145)

## 2023-05-26 LAB — MAGNESIUM: Magnesium: 1.6 mg/dL — ABNORMAL LOW (ref 1.7–2.4)

## 2023-05-26 MED ORDER — ONDANSETRON HCL 4 MG/2ML IJ SOLN: 4.0000 mg | Freq: Four times a day (QID) | INTRAMUSCULAR | Status: DC | PRN

## 2023-05-26 MED ORDER — CIPROFLOXACIN HCL 500 MG PO TABS
500.0000 mg | ORAL_TABLET | Freq: Two times a day (BID) | ORAL | Status: DC
  Administered 2023-05-26: 500 mg via ORAL
  Filled 2023-05-26 (×2): qty 1

## 2023-05-26 MED ORDER — ACETAMINOPHEN 325 MG PO TABS
650.0000 mg | ORAL_TABLET | Freq: Four times a day (QID) | ORAL | Status: DC | PRN
  Administered 2023-05-28 – 2023-06-04 (×6): 650 mg via ORAL
  Filled 2023-05-26 (×6): qty 2

## 2023-05-26 MED ORDER — MAGNESIUM SULFATE 4 GM/100ML IV SOLN
4.0000 g | Freq: Once | INTRAVENOUS | Status: AC
  Administered 2023-05-26: 4 g via INTRAVENOUS
  Filled 2023-05-26: qty 100

## 2023-05-26 MED ORDER — OLANZAPINE 10 MG IM SOLR: 5.0000 mg | Freq: Three times a day (TID) | INTRAMUSCULAR | Status: DC | PRN

## 2023-05-26 MED ORDER — MAGNESIUM HYDROXIDE 400 MG/5ML PO SUSP: 30.0000 mL | Freq: Every day | ORAL | Status: DC | PRN

## 2023-05-26 MED ORDER — MIRTAZAPINE 15 MG PO TABS
30.0000 mg | ORAL_TABLET | Freq: Every day | ORAL | Status: DC
  Administered 2023-05-26 – 2023-06-10 (×16): 30 mg via ORAL
  Filled 2023-05-26 (×16): qty 2

## 2023-05-26 MED ORDER — ONDANSETRON HCL 4 MG PO TABS: 4.0000 mg | ORAL_TABLET | Freq: Four times a day (QID) | ORAL | Status: DC | PRN

## 2023-05-26 MED ORDER — CIPROFLOXACIN HCL 500 MG PO TABS: 500.0000 mg | ORAL_TABLET | Freq: Two times a day (BID) | ORAL | Status: DC

## 2023-05-26 MED ORDER — AMLODIPINE BESYLATE 5 MG PO TABS
10.0000 mg | ORAL_TABLET | Freq: Every day | ORAL | Status: DC
  Administered 2023-05-27 – 2023-05-29 (×3): 10 mg via ORAL
  Filled 2023-05-26 (×3): qty 2

## 2023-05-26 MED ORDER — BENAZEPRIL HCL 20 MG PO TABS
40.0000 mg | ORAL_TABLET | Freq: Every day | ORAL | Status: DC
  Administered 2023-05-27 – 2023-05-29 (×3): 40 mg via ORAL
  Filled 2023-05-26 (×3): qty 2

## 2023-05-26 MED ORDER — AMLODIPINE BESYLATE 10 MG PO TABS
10.0000 mg | ORAL_TABLET | Freq: Every day | ORAL | Status: DC
  Administered 2023-05-26: 10 mg via ORAL
  Filled 2023-05-26: qty 1

## 2023-05-26 MED ORDER — AMLODIPINE BESYLATE 10 MG PO TABS
10.0000 mg | ORAL_TABLET | Freq: Every day | ORAL | Status: DC
Start: 2023-05-26 — End: 2023-06-11

## 2023-05-26 MED ORDER — PANTOPRAZOLE SODIUM 40 MG PO TBEC
40.0000 mg | DELAYED_RELEASE_TABLET | Freq: Every day | ORAL | Status: DC
  Administered 2023-05-27 – 2023-06-11 (×16): 40 mg via ORAL
  Filled 2023-05-26 (×18): qty 1

## 2023-05-26 MED ORDER — RISPERIDONE 1 MG PO TABS
0.5000 mg | ORAL_TABLET | Freq: Every day | ORAL | Status: DC
  Administered 2023-05-27 – 2023-06-09 (×14): 0.5 mg via ORAL
  Filled 2023-05-26 (×14): qty 1

## 2023-05-26 MED ORDER — NAPHAZOLINE-GLYCERIN 0.012-0.25 % OP SOLN: 1.0000 [drp] | Freq: Four times a day (QID) | OPHTHALMIC | Status: DC | PRN

## 2023-05-26 MED ORDER — CEPHALEXIN 500 MG PO CAPS: 500.0000 mg | ORAL_CAPSULE | Freq: Three times a day (TID) | ORAL | Status: DC

## 2023-05-26 MED ORDER — ALUM & MAG HYDROXIDE-SIMETH 200-200-20 MG/5ML PO SUSP: 30.0000 mL | ORAL | Status: DC | PRN

## 2023-05-26 MED ORDER — OLANZAPINE 5 MG PO TBDP: 5.0000 mg | ORAL_TABLET | Freq: Three times a day (TID) | ORAL | Status: DC | PRN

## 2023-05-26 MED ORDER — ALBUTEROL SULFATE (2.5 MG/3ML) 0.083% IN NEBU: 2.5000 mg | INHALATION_SOLUTION | RESPIRATORY_TRACT | Status: DC | PRN

## 2023-05-26 MED ORDER — TRAMADOL HCL 50 MG PO TABS
50.0000 mg | ORAL_TABLET | Freq: Two times a day (BID) | ORAL | Status: DC | PRN
  Administered 2023-05-26 – 2023-05-31 (×6): 50 mg via ORAL
  Filled 2023-05-26 (×6): qty 1

## 2023-05-26 MED ORDER — FLUOXETINE HCL 20 MG PO CAPS
20.0000 mg | ORAL_CAPSULE | Freq: Every day | ORAL | Status: DC
  Administered 2023-05-27 – 2023-06-11 (×16): 20 mg via ORAL
  Filled 2023-05-26 (×16): qty 1

## 2023-05-26 NOTE — Progress Notes (Signed)
   05/26/23 1706  Psych Admission Type (Psych Patients Only)  Admission Status Involuntary  Psychosocial Assessment  Patient Complaints Depression;Worrying  Eye Contact Brief  Facial Expression Worried;Sad  Affect Depressed;Sad  Speech Logical/coherent  Interaction Assertive  Motor Activity Slow  Appearance/Hygiene Unremarkable;In scrubs  Mood Depressed;Sad  Thought Process  Coherency WDL  Content WDL  Delusions None reported or observed  Perception WDL  Hallucination None reported or observed  Judgment Impaired  Confusion None  Danger to Self  Current suicidal ideation? Denies  Danger to Others  Danger to Others None reported or observed   Patient is A/Ox 3 with situation.  Patient states she does not recall the overdose and was found unconscious on Thursday. She last remembers waking up Monday morning, traveling to [place], and then being found on Thursday. Patient reports taking two different medications for anxiety, one in the daytime and one at night, but is unsure which medication was involved in the overdose. She also reports substance use, stating that cocaine was found in her system, though she does not recall taking it.  She endorses significant depression, rated 8.5/10 earlier in the day, but currently denies active suicidal ideation (SI), homicidal ideation (HI), auditory hallucinations (AH), and visual hallucinations (VH). She does not report symptoms of acute anxiety at this time. She has a history of psychiatric admissions, the most recent being in September. She had a fall prior to admission with an abrasion to lateral left hip. Dressing on site prior to admission; provider contacted for further intervention.

## 2023-05-26 NOTE — Plan of Care (Signed)
 Natalie Edwards

## 2023-05-26 NOTE — Plan of Care (Signed)
 Patient being transferred to [facility].  Problem: Education: Goal: Knowledge of General Education information will improve Description: Including pain rating scale, medication(s)/side effects and non-pharmacologic comfort measures Outcome: Adequate for Discharge   Problem: Health Behavior/Discharge Planning: Goal: Ability to manage health-related needs will improve Outcome: Adequate for Discharge   Problem: Clinical Measurements: Goal: Ability to maintain clinical measurements within normal limits will improve Outcome: Adequate for Discharge Goal: Will remain free from infection Outcome: Adequate for Discharge Goal: Diagnostic test results will improve Outcome: Adequate for Discharge Goal: Respiratory complications will improve Outcome: Adequate for Discharge Goal: Cardiovascular complication will be avoided Outcome: Adequate for Discharge   Problem: Activity: Goal: Risk for activity intolerance will decrease Outcome: Adequate for Discharge   Problem: Nutrition: Goal: Adequate nutrition will be maintained Outcome: Adequate for Discharge   Problem: Coping: Goal: Level of anxiety will decrease Outcome: Adequate for Discharge   Problem: Elimination: Goal: Will not experience complications related to bowel motility Outcome: Adequate for Discharge Goal: Will not experience complications related to urinary retention Outcome: Adequate for Discharge   Problem: Pain Managment: Goal: General experience of comfort will improve and/or be controlled Outcome: Adequate for Discharge   Problem: Safety: Goal: Ability to remain free from injury will improve Outcome: Adequate for Discharge   Problem: Skin Integrity: Goal: Risk for impaired skin integrity will decrease Outcome: Adequate for Discharge   Problem: Acute Rehab PT Goals(only PT should resolve) Goal: Pt Will Ambulate Outcome: Adequate for Discharge Goal: Pt Will Go Up/Down Stairs Outcome: Adequate for Discharge

## 2023-05-26 NOTE — TOC Transition Note (Addendum)
 Transition of Care [facility]) - Discharge Note   Patient Details  Name: Natalie Edwards MRN: [id number] Date of Birth: 09-20-1965  Transition of [facility]) CM/SW Contact:  Mearl Latin, LCSW Phone Number: 05/26/2023, 3:41 PM   Clinical Narrative:    3:41 PM-CSW received request to hold patient's discharge as [facility] has a staffing issue. CSW notified Sheriff to hold off on transport.   4:04 PM-Sheriff had already transported patient and is at [facility]. [facility] placing patient in gero unit instead per Danika. RN calling report.    Final next level of care: Psychiatric Hospital Barriers to Discharge: Barriers Resolved   Patient Goals and CMS Choice Patient states their goals for this hospitalization and ongoing recovery are:: recovery          Discharge Placement                Patient to be transferred to facility by: [facility] Name of family member notified: cousin Patient and family notified of of transfer: 05/26/23  Discharge Plan and Services Additional resources added to the After Visit Summary for   In-house Referral: Clinical Social Work                                   Social Drivers of Health (SDOH) Interventions SDOH Screenings   Food Insecurity: Food Insecurity Present (05/24/2023)  Housing: Low Risk  (05/24/2023)  Transportation Needs: Unmet Transportation Needs (05/24/2023)  Utilities: Not At Risk (05/24/2023)  Alcohol Screen: Low Risk  (03/06/2023)  Depression (PHQ2-9): Low Risk  (03/10/2023)  Financial Resource Strain: Low Risk  (03/06/2023)  Physical Activity: Sufficiently Active (05/22/2022)  Social Connections: Socially Isolated (03/06/2023)  Stress: Stress Concern Present (03/06/2023)  Tobacco Use: High Risk (04/07/2023)     Readmission Risk Interventions    05/26/2023   10:15 AM 09/30/2022    3:47 PM  Readmission Risk Prevention Plan  Transportation Screening Complete Complete  PCP or Specialist Appt within 5-7 Days  Complete  Home Care Screening   Complete  Medication Review (RN CM)  Referral to Pharmacy  Medication Review (RN Care Manager) Complete   PCP or Specialist appointment within 3-5 days of discharge Complete   HRI or Home Care Consult Complete   SW Recovery Care/Counseling Consult Complete   Palliative Care Screening Not Applicable   Skilled Nursing Facility Not Applicable

## 2023-05-26 NOTE — TOC Progression Note (Addendum)
 Transition of Care [facility]) - Progression Note    Patient Details  Name: Natalie Edwards MRN: [id number] Date of Birth: 1966-03-13  Transition [facility] Care [facility]) CM/SW Contact  Mearl Latin, LCSW Phone Number: 05/26/2023, 10:16 AM  Clinical Narrative:    Patient discharging to [facility] today as arranged by psych FNP. Will require Sheriff transport due to IVC.  CSW met with patient and her cousin at bedside with verbal consent. Patient shared concern over her living arrangements with her cousin in [place]. CSW shared community resources (on AVS) for patient to follow up with. Cousin is going to try to assist patient as able.    Expected Discharge Plan: Psychiatric Hospital Barriers to Discharge: Barriers Resolved  Expected Discharge Plan and Services In-house Referral: Clinical Social Work     Living arrangements for the past 2 months: Apartment Expected Discharge Date: 05/26/23                                     Social Determinants of Health (SDOH) Interventions SDOH Screenings   Food Insecurity: Food Insecurity Present (05/24/2023)  Housing: Low Risk  (05/24/2023)  Transportation Needs: Unmet Transportation Needs (05/24/2023)  Utilities: Not At Risk (05/24/2023)  Alcohol Screen: Low Risk  (03/06/2023)  Depression (PHQ2-9): Low Risk  (03/10/2023)  Financial Resource Strain: Low Risk  (03/06/2023)  Physical Activity: Sufficiently Active (05/22/2022)  Social Connections: Socially Isolated (03/06/2023)  Stress: Stress Concern Present (03/06/2023)  Tobacco Use: High Risk (04/07/2023)    Readmission Risk Interventions    05/26/2023   10:15 AM 09/30/2022    3:47 PM  Readmission Risk Prevention Plan  Transportation Screening Complete Complete  PCP or Specialist Appt within 5-7 Days  Complete  Home Care Screening  Complete  Medication Review (RN CM)  Referral to Pharmacy  Medication Review (RN Care Manager) Complete   PCP or Specialist appointment within 3-5 days of  discharge Complete   HRI or Home Care Consult Complete   SW Recovery Care/Counseling Consult Complete   Palliative Care Screening Not Applicable   Skilled Nursing Facility Not Applicable

## 2023-05-26 NOTE — Discharge Summary (Signed)
 Natalie Edwards ZOX:[id number] DOB: Dec 09, 1965 DOA: 05/22/2023  PCP: [name], MD  Admit date: 05/22/2023  Discharge date: 05/26/2023  Admitted From: Home   Disposition:  [facility]   Recommendations for Outpatient Follow-up:   Follow up with PCP in 1-2 weeks  PCP Please obtain BMP/CBC, 2 view CXR in 1week,  (see Discharge instructions)   PCP Please follow up on the following pending results:    Home Health: None   Equipment/Devices: None  Consultations: Psych Discharge Condition: Stable    CODE STATUS: Full    Diet Recommendation: Heart Healthy     Chief Complaint  Patient presents with   Drug Overdose     Brief history of present illness from the day of admission and additional interim summary    58 y.o. female with medical history significant of HTN, GERD, anxiety and depression, Hx of SI with suicide attempts, hx of gastric ulcers, insomnia, leukocytosis who presents to ED brought in by family after patient was found down after attempt at self arm by taking extra doses of sedative home medications.  Poison control was contacted in the emergency department.  She was IVC in the ED.                                                                   Hospital Course   Intentional overdose with toxic encephalopathy due to overdose from sedative medications. -CT head and cervical spine negative -Patient is currently under IVC.  Psychiatry consulted, per psych will require [facility] placement.  Continue sitter at bedside.  Currently not suicidal homicidal.  Medication effects seem to have resolved, initial talk screen negative, LFTs stable INR stable.  Cleared by poison control on 05/24/2023.   Patient and family members unsure as to what medications she took but according to the family members this is not the first time she  has overdosed on her medications.  Unfortunately she is on multiple sedating medications which are as follows   Dilaudid, Prozac, Atarax, Remeron, Lyrica, Phenergan, Restoril, Risperdal, Deseril will request her psychiatrist and PCP to kindly note and cut down on sedating medications.  She is at very high risk for intentional or unintentional polypharmacy.  Seen by psych here will be discharged to [facility] on 05/26/2023 deferred psych medications to the psychiatrist.       SIRS due to UTI -Chest x-ray clear, clinically responded very well to Rocephin, symptom-free now, urine cultures came back discussed with Dr. Thedore Mins ID, significant infection likely from Klebsiella for which she will get Cipro, ESBL E. coli likely contaminant or colonizer, has already responded well to Rocephin 3 doses.  Clinically symptoms have resolved.  Will give her 3 more days of oral Cipro upon discharge.   AKI -To combination of mild rhabdomyolysis and dehydration, resolved with IV  fluids   Rhabdomyolysis -Presented with CK 3722, trend stable and improving -Resolved with IV fluids   Cocaine abuse -Was counseled to quit   Hypomagnesemia and hypophosphatemia -Replaced    Discharge diagnosis     Principal Problem:   Ingestion of substance, intentional self-harm, sequela (HCC) Active Problems:   AKI (acute kidney injury) (HCC)   GERD (gastroesophageal reflux disease)   MDD (major depressive disorder), recurrent severe, without psychosis (HCC)   SIRS (systemic inflammatory response syndrome) (HCC)   Rhabdomyolysis   Cocaine abuse (HCC)    Discharge instructions    Discharge Instructions     Diet - low sodium heart healthy   Complete by: As directed    Discharge instructions   Complete by: As directed    Follow with Primary MD [name], MD in 7 days   Get CBC, CMP, 2 view Chest X ray -  checked next visit with your primary MD or [facility] MD   Activity: As tolerated with Full fall precautions use  walker/cane & assistance as needed  Disposition [facility]  Diet: Heart Healthy   Special Instructions: If you have smoked or chewed Tobacco  in the last 2 yrs please stop smoking, stop any regular Alcohol  and or any Recreational drug use.  On your next visit with your primary care physician please Get Medicines reviewed and adjusted.  Please request your Prim.MD to go over all Hospital Tests and Procedure/Radiological results at the follow up, please get all Hospital records sent to your Prim MD by signing hospital release before you go home.  If you experience worsening of your admission symptoms, develop shortness of breath, life threatening emergency, suicidal or homicidal thoughts you must seek medical attention immediately by calling 911 or calling your MD immediately  if symptoms less severe.  You Must read complete instructions/literature along with all the possible adverse reactions/side effects for all the Medicines you take and that have been prescribed to you. Take any new Medicines after you have completely understood and accpet all the possible adverse reactions/side effects.   Do not drive when taking Pain medications.  Do not take more than prescribed Pain, Sleep and Anxiety Medications  Wear Seat belts while driving.   Increase activity slowly   Complete by: As directed        Discharge Medications   Allergies as of 05/26/2023       Reactions   Hydroxyzine Other (See Comments)   Palpitations and jitteriness    Ambien [zolpidem Tartrate] Other (See Comments)   Jitteriness, nervousness, abdominal pain   Ibuprofen         Medication List     STOP taking these medications    amLODipine-benazepril 10-40 MG capsule Commonly known as: LOTREL   Belsomra 20 MG Tabs Generic drug: Suvorexant   hydrochlorothiazide 25 MG tablet Commonly known as: HYDRODIURIL   HYDROmorphone 4 MG tablet Commonly known as: Dilaudid   hydrOXYzine 50 MG tablet Commonly known as:  ATARAX   metoCLOPramide 5 MG tablet Commonly known as: Reglan   polyethylene glycol 17 g packet Commonly known as: MIRALAX / GLYCOLAX   pregabalin 75 MG capsule Commonly known as: LYRICA   promethazine 12.5 MG suppository Commonly known as: PHENERGAN   risperiDONE 0.5 MG tablet Commonly known as: RISPERDAL   temazepam 30 MG capsule Commonly known as: RESTORIL   traZODone 100 MG tablet Commonly known as: DESYREL       TAKE these medications    acetaminophen 500 MG tablet  Commonly known as: TYLENOL Take 500 mg by mouth every 6 (six) hours as needed for moderate pain (pain score 4-6).   amLODipine 10 MG tablet Commonly known as: NORVASC Take 1 tablet (10 mg total) by mouth daily.   ciprofloxacin 500 MG tablet Commonly known as: CIPRO Take 1 tablet (500 mg total) by mouth 2 (two) times daily.   dicyclomine 20 MG tablet Commonly known as: BENTYL Take 1 tablet (20 mg total) by mouth 3 (three) times daily before meals.   FLUoxetine 20 MG capsule Commonly known as: PROZAC Take 1 capsule (20 mg total) by mouth daily.   mirtazapine 45 MG tablet Commonly known as: REMERON TAKE 1 TABLET BY MOUTH AT  BEDTIME What changed: Another medication with the same name was removed. Continue taking this medication, and follow the directions you see here.   pantoprazole 40 MG tablet Commonly known as: PROTONIX Take 1 tablet (40 mg total) by mouth daily. What changed:  when to take this reasons to take this         Follow-up Information     [name], MD. Schedule an appointment as soon as possible for a visit in 1 week(s).   Specialty: Family Medicine Contact information: [address] [place] [address] [phone]                 Major procedures and Radiology Reports - PLEASE review detailed and final reports thoroughly  -      DG Chest Port 1 View Result Date: 05/24/2023 CLINICAL DATA:  Shortness of breath. EXAM: PORTABLE CHEST 1  VIEW COMPARISON:  05/22/2023 FINDINGS: The lungs are clear without focal pneumonia, edema, pneumothorax or pleural effusion. Cardiopericardial silhouette is at upper limits of normal for size. No acute bony abnormality. Telemetry leads overlie the chest. IMPRESSION: No active disease. Electronically Signed   By: Kennith Center M.D.   On: 05/24/2023 08:00   CT Head Wo Contrast Result Date: 05/22/2023 CLINICAL DATA:  Polytrauma, blunt EXAM: CT HEAD WITHOUT CONTRAST CT CERVICAL SPINE WITHOUT CONTRAST TECHNIQUE: Multidetector CT imaging of the head and cervical spine was performed following the standard protocol without intravenous contrast. Multiplanar CT image reconstructions of the cervical spine were also generated. RADIATION DOSE REDUCTION: This exam was performed according to the departmental dose-optimization program which includes automated exposure control, adjustment of the mA and/or kV according to patient size and/or use of iterative reconstruction technique. COMPARISON:  None Available. FINDINGS: CT HEAD FINDINGS Brain: No evidence of acute infarction, hemorrhage, hydrocephalus, extra-axial collection or mass lesion/mass effect. Vascular: Calcific atherosclerosis.  No hyperdense vessel. Skull: No acute fracture. Sinuses/Orbits: Clear sinuses.  No acute orbital findings. Other: No mastoid effusions. CT CERVICAL SPINE FINDINGS Alignment: No substantial sagittal subluxation. Reversal of the normal cervical lordosis. Skull base and vertebrae: No acute fracture. Vertebral body heights are maintained. Soft tissues and spinal canal: No prevertebral fluid or swelling. No visible canal hematoma. Disc levels: Mild-to-moderate multilevel bony degenerative change. Upper chest: Visualized lung apices are clear. IMPRESSION: No evidence of acute abnormality intracranially or in the cervical spine. Electronically Signed   By: Feliberto Harts M.D.   On: 05/22/2023 22:32   CT Cervical Spine Wo Contrast Result Date:  05/22/2023 CLINICAL DATA:  Polytrauma, blunt EXAM: CT HEAD WITHOUT CONTRAST CT CERVICAL SPINE WITHOUT CONTRAST TECHNIQUE: Multidetector CT imaging of the head and cervical spine was performed following the standard protocol without intravenous contrast. Multiplanar CT image reconstructions of the cervical spine were also generated. RADIATION  DOSE REDUCTION: This exam was performed according to the departmental dose-optimization program which includes automated exposure control, adjustment of the mA and/or kV according to patient size and/or use of iterative reconstruction technique. COMPARISON:  None Available. FINDINGS: CT HEAD FINDINGS Brain: No evidence of acute infarction, hemorrhage, hydrocephalus, extra-axial collection or mass lesion/mass effect. Vascular: Calcific atherosclerosis.  No hyperdense vessel. Skull: No acute fracture. Sinuses/Orbits: Clear sinuses.  No acute orbital findings. Other: No mastoid effusions. CT CERVICAL SPINE FINDINGS Alignment: No substantial sagittal subluxation. Reversal of the normal cervical lordosis. Skull base and vertebrae: No acute fracture. Vertebral body heights are maintained. Soft tissues and spinal canal: No prevertebral fluid or swelling. No visible canal hematoma. Disc levels: Mild-to-moderate multilevel bony degenerative change. Upper chest: Visualized lung apices are clear. IMPRESSION: No evidence of acute abnormality intracranially or in the cervical spine. Electronically Signed   By: Feliberto Harts M.D.   On: 05/22/2023 22:32   DG Pelvis Portable Result Date: 05/22/2023 CLINICAL DATA:  Fall, pain. EXAM: PORTABLE PELVIS 1-2 VIEWS COMPARISON:  None Available. FINDINGS: The cortical margins of the bony pelvis are intact. No fracture. Pubic symphysis and sacroiliac joints are congruent. Both femoral heads are well-seated in the respective acetabula. IMPRESSION: No pelvic fracture. Electronically Signed   By: Narda Rutherford M.D.   On: 05/22/2023 22:21   DG Chest  Port 1 View Result Date: 05/22/2023 CLINICAL DATA:  Fall. EXAM: PORTABLE CHEST 1 VIEW COMPARISON:  Chest radiograph 01/16/2023 FINDINGS: Stable upper normal heart size.The cardiomediastinal contours are normal. The lungs are clear. Pulmonary vasculature is normal. No consolidation, pleural effusion, or pneumothorax. No acute osseous abnormalities are seen. Incidental cervical ribs. IMPRESSION: No acute chest findings. Electronically Signed   By: Narda Rutherford M.D.   On: 05/22/2023 22:20    Micro Results    Recent Results (from the past 240 hours)  Blood culture (routine x 2)     Status: None (Preliminary result)   Collection Time: 05/22/23 10:30 PM   Specimen: BLOOD RIGHT HAND  Result Value Ref Range Status   Specimen Description BLOOD RIGHT HAND  Final   Special Requests   Final    BOTTLES DRAWN AEROBIC ONLY Blood Culture results may not be optimal due to an inadequate volume of blood received in culture bottles   Culture   Final    NO GROWTH 4 DAYS Performed at [facility], [address] [place], [place] [address]    Report Status PENDING  Incomplete  Blood culture (routine x 2)     Status: None (Preliminary result)   Collection Time: 05/22/23 11:04 PM   Specimen: BLOOD LEFT HAND  Result Value Ref Range Status   Specimen Description BLOOD LEFT HAND  Final   Special Requests   Final    BOTTLES DRAWN AEROBIC AND ANAEROBIC Blood Culture adequate volume   Culture   Final    NO GROWTH 4 DAYS Performed at [facility], [address] [place], [place] [address]    Report Status PENDING  Incomplete  Urine Culture     Status: Abnormal   Collection Time: 05/23/23 11:52 AM   Specimen: Urine, Clean Catch  Result Value Ref Range Status   Specimen Description URINE, CLEAN CATCH  Final   Special Requests   Final    NONE Performed at [facility], [address] [place], [place] [address]    Culture (A)  Final    >=100,000 COLONIES/mL KLEBSIELLA AEROGENES 80,000  COLONIES/mL  ESCHERICHIA COLI Confirmed Extended Spectrum Beta-Lactamase Producer (ESBL).  In bloodstream infections from ESBL organisms, carbapenems are preferred over piperacillin/tazobactam. They are shown to have a lower risk of mortality.    Report Status 05/26/2023 FINAL  Final   Organism ID, Bacteria KLEBSIELLA AEROGENES (A)  Final   Organism ID, Bacteria ESCHERICHIA COLI (A)  Final      Susceptibility   Klebsiella aerogenes - MIC*    CEFEPIME <=0.12 SENSITIVE Sensitive     CEFTRIAXONE <=0.25 SENSITIVE Sensitive     CIPROFLOXACIN <=0.25 SENSITIVE Sensitive     GENTAMICIN <=1 SENSITIVE Sensitive     IMIPENEM 1 SENSITIVE Sensitive     NITROFURANTOIN 64 INTERMEDIATE Intermediate     TRIMETH/SULFA <=20 SENSITIVE Sensitive     PIP/TAZO <=4 SENSITIVE Sensitive ug/mL    * >=100,000 COLONIES/mL KLEBSIELLA AEROGENES   Escherichia coli - MIC*    AMPICILLIN >=32 RESISTANT Resistant     CEFAZOLIN >=64 RESISTANT Resistant     CEFEPIME 1 SENSITIVE Sensitive     CEFTRIAXONE 32 RESISTANT Resistant     CIPROFLOXACIN <=0.25 SENSITIVE Sensitive     GENTAMICIN <=1 SENSITIVE Sensitive     IMIPENEM <=0.25 SENSITIVE Sensitive     NITROFURANTOIN <=16 SENSITIVE Sensitive     TRIMETH/SULFA >=320 RESISTANT Resistant     AMPICILLIN/SULBACTAM 16 INTERMEDIATE Intermediate     PIP/TAZO <=4 SENSITIVE Sensitive ug/mL    * 80,000 COLONIES/mL ESCHERICHIA COLI  MRSA Next Gen by PCR, Nasal     Status: None   Collection Time: 05/24/23  5:36 AM   Specimen: Nasal Mucosa; Nasal Swab  Result Value Ref Range Status   MRSA by PCR Next Gen NOT DETECTED NOT DETECTED Final    Comment: (NOTE) The GeneXpert MRSA Assay (FDA approved for NASAL specimens only), is one component of a comprehensive MRSA colonization surveillance program. It is not intended to diagnose MRSA infection nor to guide or monitor treatment for MRSA infections. Test performance is not FDA approved in patients less than 98 years old. Performed at  [facility], [address], [place], [place] [address]     Today   Subjective    Dewana Ammirati today has no headache,no chest abdominal pain,no new weakness tingling or numbness, feels much better   Objective   Blood pressure (!) 141/68, pulse 79, temperature 98.2 F (36.8 C), temperature source Oral, resp. rate 12, SpO2 96%.   Intake/Output Summary (Last 24 hours) at 05/26/2023 0946 Last data filed at 05/26/2023 0846 Gross per 24 hour  Intake 1400 ml  Output --  Net 1400 ml    Exam  Awake Alert, No new F.N deficits,    [place].AT,PERRAL Supple Neck,   Symmetrical Chest wall movement, Good air movement bilaterally, CTAB RRR,No Gallops,   +ve B.Sounds, Abd Soft, Non tender,  No Cyanosis, Clubbing or edema    Data Review   Recent Labs  Lab 05/22/23 2004 05/23/23 0850 05/24/23 0616 05/25/23 0513 05/26/23 0430  WBC 24.8* 21.2* 17.6* 14.2* 13.5*  HGB 13.2 11.3* 10.4* 9.9* 10.1*  HCT 39.3 34.0* 31.7* 30.1* 29.9*  PLT 473* 433* 397 409* 431*  MCV 82.7 82.7 84.3 83.4 81.3  MCH 27.8 27.5 27.7 27.4 27.4  MCHC 33.6 33.2 32.8 32.9 33.8  RDW 17.5* 16.9* 17.2* 16.8* 16.7*  LYMPHSABS 4.0  --   --  6.6* 6.2*  MONOABS 2.1*  --   --  0.9 1.2*  EOSABS 0.0  --   --  0.3 0.3  BASOSABS 0.0  --   --  0.0 0.0    Recent Labs  Lab 05/22/23 2004 05/22/23 2239 05/22/23 2350 05/22/23 2357 05/23/23 0850 05/24/23 0616 05/25/23 0513 05/26/23 0430  NA 138  --  142  --  143 140 141 136  K 4.5  --  3.8  --  3.9 3.5 3.5 4.1  CL 105  --  110  --  112* 113* 113* 110  CO2 15*  --  16*  --  20* 18* 24 22  ANIONGAP 18*  --  16*  --  11 9 4* 4*  GLUCOSE 110*  --  86  --  113* 90 99 98  BUN 49*  --  40*  --  31* 16 11 10   CREATININE 1.90*  --  1.59*  --  1.51* 1.08* 1.16* 1.04*  AST 64*  --  49*  --  44* 29 26  --   ALT 36  --  27  --  28 23 23   --   ALKPHOS 61  --  47  --  46 42 38  --   BILITOT 1.8*  --  1.6*  --  1.4* 0.6 0.4  --   ALBUMIN 3.7  --  2.8*  --  2.9* 2.5* 2.5*   --   CRP  --   --   --   --   --  6.7* 4.4*  --   PROCALCITON  --   --   --   --   --  <0.10 <0.10  --   LATICACIDVEN  --  2.4*  --  0.6  --   --   --   --   INR  --   --   --   --   --  1.1  --   --   TSH  --   --  0.405  --   --   --   --   --   HGBA1C 5.2  --   --   --   --   --   --   --   AMMONIA  --   --   --   --   --  26  --   --   BNP  --   --   --   --   --  52.2 60.6  --   MG 1.9  --  1.6*  --   --  1.8 1.6* 1.6*  PHOS  --   --  2.1*  --   --  2.4* 4.2  --   CALCIUM 9.2  --  8.2*  --  8.6* 8.3* 8.3* 8.3*    Total Time in preparing paper work, data evaluation and todays exam - 35 minutes  Signature  -    Susa Raring M.D on 05/26/2023 at 9:46 AM   -  To page go to [email or web address]

## 2023-05-26 NOTE — Group Note (Signed)
 Date:  05/26/2023 Time:  8:57 PM  Group Topic/Focus:  Wrap-Up Group:   The focus of this group is to help patients review their daily goal of treatment and discuss progress on daily workbooks.    Participation Level:  Minimal  Participation Quality:  Resistant  Affect:  Appropriate  Cognitive:  Appropriate  Insight: Limited  Engagement in Group:      Modes of Intervention:  Discussion  Additional Comments:    Natalie Edwards K Phoebie Shad 05/26/2023, 8:57 PM

## 2023-05-26 NOTE — TOC Transition Note (Signed)
 Transition of Care [facility]) - Discharge Note   Patient Details  Name: Natalie Edwards MRN: [id number] Date of Birth: [birth year]  Transition of Care [facility]) CM/SW Contact:  Mearl Latin, LCSW Phone Number: 05/26/2023, 1:43 PM   Clinical Narrative:    Patient will DC to: [facility] Anticipated DC date: 05/26/23 Family notified: Cousin Transport by: Kathryne Sharper (requests [place]ala)   Per MD patient ready for DC to [facility]. RN to call report prior to discharge [phone] bed [phone]). RN, patient, patient's family, and facility notified of DC. Discharge Summary sent to facility. IVC packet on chart. Transport requested for patient.   CSW will sign off for now as social work intervention is no longer needed. Please consult [place] again if new needs arise.     Final next level of care: Psychiatric Hospital Barriers to Discharge: Barriers Resolved   Patient Goals and CMS Choice Patient states their goals for this hospitalization and ongoing recovery are:: recovery          Discharge Placement                Patient to be transferred to facility by: [facility] Name of family member notified: cousin Patient and family notified of of transfer: 05/26/23  Discharge Plan and Services Additional resources added to the After Visit Summary for   In-house Referral: Clinical Social Work                                   Social Drivers of Health (SDOH) Interventions SDOH Screenings   Food Insecurity: Food Insecurity Present (05/24/2023)  Housing: Low Risk  (05/24/2023)  Transportation Needs: Unmet Transportation Needs (05/24/2023)  Utilities: Not At Risk (05/24/2023)  Alcohol Screen: Low Risk  (03/06/2023)  Depression (PHQ2-9): Low Risk  (03/10/2023)  Financial Resource Strain: Low Risk  (03/06/2023)  Physical Activity: Sufficiently Active (05/22/2022)  Social Connections: Socially Isolated (03/06/2023)  Stress: Stress Concern Present (03/06/2023)  Tobacco Use: High Risk (04/07/2023)      Readmission Risk Interventions    05/26/2023   10:15 AM 09/30/2022    3:47 PM  Readmission Risk Prevention Plan  Transportation Screening Complete Complete  PCP or Specialist Appt within 5-7 Days  Complete  Home Care Screening  Complete  Medication Review (RN CM)  Referral to Pharmacy  Medication Review (RN Care Manager) Complete   PCP or Specialist appointment within 3-5 days of discharge Complete   HRI or Home Care Consult Complete   SW Recovery Care/Counseling Consult Complete   Palliative Care Screening Not Applicable   Skilled Nursing Facility Not Applicable

## 2023-05-26 NOTE — Care Management Important Message (Signed)
 Important Message  Patient Details  Name: Natalie Edwards MRN: [id number] Date of Birth: Feb 23, 1966   Important Message Given:  Yes - Medicare IM     Dorena Bodo 05/26/2023, 3:04 PM

## 2023-05-26 NOTE — Progress Notes (Addendum)
 Attempted to call report to [facility] at number provided by social worker - [phone]. Receiver stated she knew nothing about that/receiving this pt. Social worker updated.

## 2023-05-27 DIAGNOSIS — F332 Major depressive disorder, recurrent severe without psychotic features: Secondary | ICD-10-CM | POA: Diagnosis not present

## 2023-05-27 LAB — CULTURE, BLOOD (ROUTINE X 2)
Culture: NO GROWTH
Culture: NO GROWTH
Special Requests: ADEQUATE

## 2023-05-27 MED ORDER — TRAZODONE HCL 50 MG PO TABS
50.0000 mg | ORAL_TABLET | Freq: Every day | ORAL | Status: DC
Start: 2023-05-27 — End: 2023-06-12
  Administered 2023-05-27 – 2023-06-10 (×15): 50 mg via ORAL
  Filled 2023-05-27 (×15): qty 1

## 2023-05-27 NOTE — Progress Notes (Signed)
   05/27/23 1300  Psych Admission Type (Psych Patients Only)  Admission Status Involuntary  Psychosocial Assessment  Patient Complaints Worrying  Eye Contact Fair  Facial Expression Worried  Affect Depressed  Speech Logical/coherent  Interaction Assertive  Motor Activity Slow  Appearance/Hygiene In scrubs  Behavior Characteristics Cooperative  Mood Depressed  Thought Process  Coherency WDL  Content WDL  Delusions WDL  Perception WDL  Hallucination None reported or observed  Judgment Impaired  Confusion None  Danger to Self  Current suicidal ideation? Denies  Danger to Others  Danger to Others None reported or observed

## 2023-05-27 NOTE — Plan of Care (Signed)
  Problem: Health Behavior/Discharge Planning: Goal: Ability to make decisions will improve Outcome: Progressing Goal: Compliance with therapeutic regimen will improve Outcome: Progressing
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
## 2023-05-27 NOTE — Group Note (Signed)
 Date:  05/27/2023 Time:  10:36 AM  Group Topic/Focus:  Orientation:   The focus of this group is to educate the patient on the purpose and policies of crisis stabilization and provide a format to answer questions about their admission.  The group details unit policies and expectations of patients while admitted.    Participation Level:  Active  Participation Quality:  Appropriate  Affect:  Appropriate  Cognitive:  Appropriate  Insight: Appropriate  Engagement in Group:  Engaged  Modes of Intervention:  Socialization  Additional Comments:     Alexis Frock 05/27/2023, 10:36 AM

## 2023-05-27 NOTE — Plan of Care (Signed)
   05/27/23 0000  Psych Admission Type (Psych Patients Only)  Admission Status Involuntary  Psychosocial Assessment  Patient Complaints Depression  Eye Contact Brief  Facial Expression Worried  Affect Depressed  Speech Logical/coherent  Interaction Assertive  Motor Activity Slow  Appearance/Hygiene In scrubs  Behavior Characteristics Cooperative  Mood Depressed  Thought Process  Coherency WDL  Content WDL  Delusions WDL  Perception WDL  Hallucination None reported or observed  Judgment Impaired  Confusion None  Danger to Self  Current suicidal ideation? Denies  Danger to Others  Danger to Others None reported or observed    Problem: Education: Goal: Utilization of techniques to improve thought processes will improve Outcome: Progressing Goal: Knowledge of the prescribed therapeutic regimen will improve Outcome: Progressing   Problem: Coping: Goal: Coping ability will improve Outcome: Progressing Goal: Will verbalize feelings Outcome: Progressing

## 2023-05-27 NOTE — Group Note (Signed)
 Date:  05/27/2023 Time:  9:49 PM  Group Topic/Focus:  Self Care:   The focus of this group is to help patients understand the importance of self-care in order to improve or restore emotional, physical, spiritual, interpersonal, and financial health.    Participation Level:  Active  Participation Quality:  Appropriate  Affect:  Appropriate  Cognitive:  Appropriate  Insight: Appropriate  Engagement in Group:  Engaged  Modes of Intervention:  Education  Additional Comments:    Garry Heater 05/27/2023, 9:49 PM

## 2023-05-27 NOTE — BH IP Treatment Plan (Signed)
 Interdisciplinary Treatment and Diagnostic Plan Update  05/27/2023 Time of Session: 11:59 AM  Natalie Edwards MRN: [id number]  Principal Diagnosis: MDD (major depressive disorder), recurrent episode, severe (HCC)  Secondary Diagnoses: Principal Problem:   MDD (major depressive disorder), recurrent episode, severe (HCC)   Current Medications:  Current Facility-Administered Medications  Medication Dose Route Frequency Provider Last Rate Last Admin   acetaminophen (TYLENOL) tablet 650 mg  650 mg Oral Q6H PRN Starkes-Perry, Juel Burrow, FNP       albuterol (PROVENTIL) (2.5 MG/3ML) 0.083% nebulizer solution 2.5 mg  2.5 mg Nebulization Q2H PRN Starkes-Perry, Juel Burrow, FNP       alum & mag hydroxide-simeth (MAALOX/MYLANTA) 200-200-20 MG/5ML suspension 30 mL  30 mL Oral Q4H PRN Starkes-Perry, Juel Burrow, FNP       amLODipine (NORVASC) tablet 10 mg  10 mg Oral Daily Maryagnes Amos, FNP   10 mg at 05/27/23 0901   And   benazepril (LOTENSIN) tablet 40 mg  40 mg Oral Daily Maryagnes Amos, FNP   40 mg at 05/27/23 0901   FLUoxetine (PROZAC) capsule 20 mg  20 mg Oral Daily Maryagnes Amos, FNP   20 mg at 05/27/23 0901   magnesium hydroxide (MILK OF MAGNESIA) suspension 30 mL  30 mL Oral Daily PRN Maryagnes Amos, FNP       mirtazapine (REMERON) tablet 30 mg  30 mg Oral QHS Maryagnes Amos, FNP   30 mg at 05/26/23 2111   naphazoline-glycerin (CLEAR EYES REDNESS) ophth solution 1-2 drop  1-2 drop Both Eyes QID PRN Starkes-Perry, Juel Burrow, FNP       OLANZapine (ZYPREXA) injection 5 mg  5 mg Intramuscular TID PRN Starkes-Perry, Juel Burrow, FNP       OLANZapine zydis (ZYPREXA) disintegrating tablet 5 mg  5 mg Oral TID PRN Rosario Adie, Juel Burrow, FNP       ondansetron (ZOFRAN) tablet 4 mg  4 mg Oral Q6H PRN Starkes-Perry, Juel Burrow, FNP       Or   ondansetron (ZOFRAN) injection 4 mg  4 mg Intravenous Q6H PRN Starkes-Perry, Juel Burrow, FNP       pantoprazole (PROTONIX) EC tablet 40 mg  40 mg  Oral Daily Maryagnes Amos, FNP   40 mg at 05/27/23 0901   risperiDONE (RISPERDAL) tablet 0.5 mg  0.5 mg Oral Daily Maryagnes Amos, FNP   0.5 mg at 05/27/23 0901   traMADol (ULTRAM) tablet 50 mg  50 mg Oral Q12H PRN Maryagnes Amos, FNP   50 mg at 05/26/23 2110   traZODone (DESYREL) tablet 50 mg  50 mg Oral QHS Verner Chol, MD       PTA Medications: Medications Prior to Admission  Medication Sig Dispense Refill Last Dose/Taking   acetaminophen (TYLENOL) 500 MG tablet Take 500 mg by mouth every 6 (six) hours as needed for moderate pain (pain score 4-6).      amLODipine (NORVASC) 10 MG tablet Take 1 tablet (10 mg total) by mouth daily.      ciprofloxacin (CIPRO) 500 MG tablet Take 1 tablet (500 mg total) by mouth 2 (two) times daily.      dicyclomine (BENTYL) 20 MG tablet Take 1 tablet (20 mg total) by mouth 3 (three) times daily before meals. 90 tablet 0    FLUoxetine (PROZAC) 20 MG capsule Take 1 capsule (20 mg total) by mouth daily. 30 capsule 3    mirtazapine (REMERON) 45 MG tablet TAKE 1 TABLET BY MOUTH AT  BEDTIME 90 tablet 3    pantoprazole (PROTONIX) 40 MG tablet Take 1 tablet (40 mg total) by mouth daily. (Patient taking differently: Take 40 mg by mouth daily as needed (acid reflux).) 30 tablet 3     Patient Stressors:    Patient Strengths:    Treatment Modalities: Medication Management, Group therapy, Case management,  1 to 1 session with clinician, Psychoeducation, Recreational therapy.   Physician Treatment Plan for Primary Diagnosis: MDD (major depressive disorder), recurrent episode, severe (HCC) Long Term Goal(s): Improvement in symptoms so as ready for discharge   Short Term Goals: Ability to identify changes in lifestyle to reduce recurrence of condition will improve Ability to verbalize feelings will improve Ability to disclose and discuss suicidal ideas Ability to demonstrate self-control will improve Ability to identify and develop effective  coping behaviors will improve Ability to maintain clinical measurements within normal limits will improve Compliance with prescribed medications will improve Ability to identify triggers associated with substance abuse/mental health issues will improve  Medication Management: Evaluate patient's response, side effects, and tolerance of medication regimen.  Therapeutic Interventions: 1 to 1 sessions, Unit Group sessions and Medication administration.  Evaluation of Outcomes: Not Progressing  Physician Treatment Plan for Secondary Diagnosis: Principal Problem:   MDD (major depressive disorder), recurrent episode, severe (HCC)  Long Term Goal(s): Improvement in symptoms so as ready for discharge   Short Term Goals: Ability to identify changes in lifestyle to reduce recurrence of condition will improve Ability to verbalize feelings will improve Ability to disclose and discuss suicidal ideas Ability to demonstrate self-control will improve Ability to identify and develop effective coping behaviors will improve Ability to maintain clinical measurements within normal limits will improve Compliance with prescribed medications will improve Ability to identify triggers associated with substance abuse/mental health issues will improve     Medication Management: Evaluate patient's response, side effects, and tolerance of medication regimen.  Therapeutic Interventions: 1 to 1 sessions, Unit Group sessions and Medication administration.  Evaluation of Outcomes: Not Progressing   RN Treatment Plan for Primary Diagnosis: MDD (major depressive disorder), recurrent episode, severe (HCC) Long Term Goal(s): Knowledge of disease and therapeutic regimen to maintain health will improve  Short Term Goals: Ability to remain free from injury will improve, Ability to verbalize frustration and anger appropriately will improve, Ability to demonstrate self-control, Ability to participate in decision making will  improve, Ability to verbalize feelings will improve, Ability to disclose and discuss suicidal ideas, Ability to identify and develop effective coping behaviors will improve, and Compliance with prescribed medications will improve  Medication Management: RN will administer medications as ordered by provider, will assess and evaluate patient's response and provide education to patient for prescribed medication. RN will report any adverse and/or side effects to prescribing provider.  Therapeutic Interventions: 1 on 1 counseling sessions, Psychoeducation, Medication administration, Evaluate responses to treatment, Monitor vital signs and CBGs as ordered, Perform/monitor CIWA, COWS, AIMS and Fall Risk screenings as ordered, Perform wound care treatments as ordered.  Evaluation of Outcomes: Not Progressing   LCSW Treatment Plan for Primary Diagnosis: MDD (major depressive disorder), recurrent episode, severe (HCC) Long Term Goal(s): Safe transition to appropriate next level of care at discharge, Engage patient in therapeutic group addressing interpersonal concerns.  Short Term Goals: Engage patient in aftercare planning with referrals and resources, Increase social support, Increase ability to appropriately verbalize feelings, Increase emotional regulation, Facilitate acceptance of mental health diagnosis and concerns, Facilitate patient progression through stages of change regarding substance use diagnoses  and concerns, Identify triggers associated with mental health/substance abuse issues, and Increase skills for wellness and recovery  Therapeutic Interventions: Assess for all discharge needs, 1 to 1 time with Social worker, Explore available resources and support systems, Assess for adequacy in community support network, Educate family and significant other(s) on suicide prevention, Complete Psychosocial Assessment, Interpersonal group therapy.  Evaluation of Outcomes: Not Progressing   Progress in  Treatment: Attending groups: Yes. and No. Participating in groups: Yes. and No. Taking medication as prescribed: Yes. Toleration medication: Yes. Family/Significant other contact made: No, will contact:  CSW will contact if given permission Patient understands diagnosis: Yes. Discussing patient identified problems/goals with staff: Yes. Medical problems stabilized or resolved: Yes. Denies suicidal/homicidal ideation: Yes. Issues/concerns per patient self-inventory: No. Other: None   New problem(s) identified: No, Describe:  None identified   New Short Term/Long Term Goal(s): detox, elimination of symptoms of psychosis, medication management for mood stabilization; elimination of SI thoughts; development of comprehensive mental wellness/sobriety plan.   Patient Goals:  " I just need somewhere to stay till I get back there. Just get myself better while I'm here"   Discharge Plan or Barriers: CSW will assist with appropriate discharge planning   Reason for Continuation of Hospitalization: Depression Medication stabilization  Estimated Length of Stay: 1 to 7 days   Last 3 [place] Suicide Severity Risk Score: Flowsheet Row Admission (Current) from 05/26/2023 in [facility] ED to Hosp-Admission (Discharged) from 05/22/2023 in [facility] ED from 03/15/2023 in [facility] Emergency Department [facility]  C-SSRS RISK CATEGORY No Risk No Risk No Risk       Last [facility] 2/9 Scores:    03/10/2023   10:16 AM 10/28/2022    2:47 PM 10/08/2022   10:41 AM  Depression screen PHQ 2/9  Decreased Interest 1 0 0  Down, Depressed, Hopeless 0 0 0  PHQ - 2 Score 1 0 0  Altered sleeping  0 0  Tired, decreased energy  0 0  Change in appetite  0 0  Feeling bad or failure about yourself   0 0  Trouble concentrating  0 0  Moving slowly or fidgety/restless  0 0  Suicidal thoughts  0 0  PHQ-9 Score  0 0  Difficult doing work/chores  Not difficult  at all Not difficult at all    Scribe for Treatment Team: Elza Rafter, [facility] 05/27/2023 4:42 PM

## 2023-05-27 NOTE — Group Note (Signed)
 Therapy Group Note  Group Topic: Transfer Training Group Date: 05/27/2023 Start Time: 1300 End Time: 1335 Facilitators: Ketzaly Cardella, Thomes Dinning, PT    Group Description: Group educated on sequence and techniques to maximize safety with functional transfers.  Additionally, integrated education on impact of seating surfaces, use of assistive device and management of orthostasis with movement transitions.  Patients actively engaged with functional transfers (sit/stand) from various seating surfaces, with and without assist devices, working to integrate and retain education provided during session.  Allowed time for questions and further discussion on mobility concerns/needs.     Therapeutic Goal(s): Identify and demonstrate safe technique for sit/stand transfers from various seating surfaces. Identify and demonstrate safe use of assistive devices with basic transfers and simple mobility. Identify and demonstrate ability to recognize signs/symptoms of orthostasis and appropriate compensatory/safety techniques.   Individual Participation:  Pt was attentive but quietly observant and although pt declined to participate during physical activities was attentive throughout.       Participation Level: Non-verbal   Participation Quality: Observed activity only    Behavior: Appropriate, Attentive , and Calm   Speech/Thought Process: Observed session with no verbal discussion    Affect/Mood: Appropriate and Flat   Insight: Difficult to assess    Judgement: Difficult to assess    Individualization: Pt observed appropriately but did not participate verbally or with physical activities during the session.   Modes of Intervention: Activity, Discussion, and Education  Patient Response to Interventions:  Attentive and Engaged   Plan: Continue to engage patient in OT groups 1 - 2x/week.  Ovidio Hanger PT, DPT 05/27/23, 2:21 PM

## 2023-05-27 NOTE — BHH Suicide Risk Assessment (Signed)
 [facility] Admission Suicide Risk Assessment   Nursing information obtained from:  Patient Demographic factors:  Divorced or widowed, Low socioeconomic status, Unemployed Current Mental Status:  NA Loss Factors:  Financial problems / change in socioeconomic status (homelessness) Historical Factors:  Prior suicide attempts, Impulsivity Risk Reduction Factors:  NA  Total Time spent with patient: 30 minutes Principal Problem: MDD (major depressive disorder), recurrent episode, severe (HCC) Diagnosis:  Principal Problem:   MDD (major depressive disorder), recurrent episode, severe (HCC)  Subjective Data: Natalie Edwards is a 58 y.o. female admitted: Presented to the [facility] 05/22/2023  6:47 PM for cocaine overdose. She carries the psychiatric diagnoses of Major depressive disorder, recurrent without psychotic features, Cocaine abuse and previous suicide attempt, and has a past medical history of HTN, GERD. Her current presentation of recent cocaine overdose with past history of MDD and suicide attempts is most consistent with major depressive disorder, recurrent vs cocaine induced mood disorder. She meets criteria for inpatient psychiatric admission based on recent cocaine overdose and risk of harm to self.   Continued Clinical Symptoms:    The "Alcohol Use Disorders Identification Test", Guidelines for Use in Primary Care, Second Edition.  World Science writer [facility]). Score between 0-7:  no or low risk or alcohol related problems. Score between 8-15:  moderate risk of alcohol related problems. Score between 16-19:  high risk of alcohol related problems. Score 20 or above:  warrants further diagnostic evaluation for alcohol dependence and treatment.   CLINICAL FACTORS:   Depression:   Comorbid alcohol abuse/dependence Insomnia Alcohol/Substance Abuse/Dependencies   Musculoskeletal: Strength & Muscle Tone: within normal limits Gait & Station: normal Patient leans: N/A  Psychiatric Specialty  Exam:  Presentation  General Appearance:  Appropriate for Environment; Casual  Eye Contact: Fair  Speech: Clear and Coherent  Speech Volume: Normal  Handedness: Right   Mood and Affect  Mood: Anxious; Depressed  Affect: Depressed; Flat   Thought Process  Thought Processes: Coherent  Descriptions of Associations:Intact  Orientation:Full (Time, Place and Person)  Thought Content:Logical  History of Schizophrenia/Schizoaffective disorder:No data recorded Duration of Psychotic Symptoms:No data recorded Hallucinations:Hallucinations: None  Ideas of Reference:None  Suicidal Thoughts:Suicidal Thoughts: No  Homicidal Thoughts:Homicidal Thoughts: No   Sensorium  Memory: Immediate Fair; Recent Fair; Remote Fair  Judgment: Impaired  Insight: Shallow   Executive Functions  Concentration: Fair  Attention Span: Fair  Recall: [employer] of Knowledge: Fair  Language: Fair   Psychomotor Activity  Psychomotor Activity: Psychomotor Activity: Normal   Assets  Assets: Communication Skills; Desire for Improvement; Physical Health   Sleep  Sleep: Sleep: Fair    Physical Exam: Physical Exam ROS Blood pressure 131/87, pulse 94, temperature 98.5 F (36.9 C), resp. rate 18, height 5\' 4"  (1.626 m), weight 82.8 kg, SpO2 98%. Body mass index is 31.33 kg/m.   COGNITIVE FEATURES THAT CONTRIBUTE TO RISK:  Thought constriction (tunnel vision)    SUICIDE RISK:   Minimal: No identifiable suicidal ideation.  Patients presenting with no risk factors but with morbid ruminations; may be classified as minimal risk based on the severity of the depressive symptoms  PLAN OF CARE: Patient is admitted to the geropsych unit with every 15 minute safety checks.  Multidisciplinary treatment team is offered.  Medication management, group/milieu therapy is offered  I certify that inpatient services furnished can reasonably be expected to improve the patient's  condition.   Verner Chol, MD 05/27/2023, 1:01 PM

## 2023-05-28 ENCOUNTER — Inpatient Hospital Stay

## 2023-05-28 ENCOUNTER — Inpatient Hospital Stay
Admit: 2023-05-28 | Discharge: 2023-05-28 | Disposition: A | Discharge status: Home / Self Care | Attending: Family Medicine | Admitting: Family Medicine

## 2023-05-28 DIAGNOSIS — F332 Major depressive disorder, recurrent severe without psychotic features: Secondary | ICD-10-CM

## 2023-05-28 DIAGNOSIS — F1910 Other psychoactive substance abuse, uncomplicated: Secondary | ICD-10-CM

## 2023-05-28 DIAGNOSIS — M79606 Pain in leg, unspecified: Secondary | ICD-10-CM

## 2023-05-28 DIAGNOSIS — I517 Cardiomegaly: Secondary | ICD-10-CM

## 2023-05-28 LAB — COMPREHENSIVE METABOLIC PANEL
ALT: 36 U/L (ref 0–44)
AST: 29 U/L (ref 15–41)
Albumin: 3.2 g/dL — ABNORMAL LOW (ref 3.5–5.0)
Alkaline Phosphatase: 48 U/L (ref 38–126)
Anion gap: 8 (ref 5–15)
BUN: 19 mg/dL (ref 6–20)
CO2: 25 mmol/L (ref 22–32)
Calcium: 8.9 mg/dL (ref 8.9–10.3)
Chloride: 106 mmol/L (ref 98–111)
Creatinine, Ser: 0.99 mg/dL (ref 0.44–1.00)
GFR, Estimated: >60 mL/min (ref >60)
Glucose, Bld: 99 mg/dL (ref 70–99)
Potassium: 3.6 mmol/L (ref 3.5–5.1)
Sodium: 139 mmol/L (ref 135–145)
Total Bilirubin: 0.4 mg/dL (ref 0.0–1.2)
Total Protein: 6.6 g/dL (ref 6.5–8.1)

## 2023-05-28 LAB — URINE DRUG SCREEN, QUALITATIVE (ARMC ONLY)
Amphetamines, Ur Screen: NOT DETECTED
Barbiturates, Ur Screen: NOT DETECTED
Benzodiazepine, Ur Scrn: NOT DETECTED
Cannabinoid 50 Ng, Ur ~~LOC~~: NOT DETECTED
Cocaine Metabolite,Ur ~~LOC~~: NOT DETECTED
MDMA (Ecstasy)Ur Screen: NOT DETECTED
Methadone Scn, Ur: NOT DETECTED
Opiate, Ur Screen: NOT DETECTED
Phencyclidine (PCP) Ur S: NOT DETECTED
Tricyclic, Ur Screen: NOT DETECTED

## 2023-05-28 LAB — CBC WITH DIFFERENTIAL/PLATELET
Abs Immature Granulocytes: 0.07 10*3/uL (ref 0.00–0.07)
Basophils Absolute: 0 10*3/uL (ref 0.0–0.1)
Basophils Relative: 0 %
Eosinophils Absolute: 0.2 10*3/uL (ref 0.0–0.5)
Eosinophils Relative: 2 %
HCT: 31.1 % — ABNORMAL LOW (ref 36.0–46.0)
Hemoglobin: 10.2 g/dL — ABNORMAL LOW (ref 12.0–15.0)
Immature Granulocytes: 1 %
Lymphocytes Relative: 35 %
Lymphs Abs: 4 10*3/uL (ref 0.7–4.0)
MCH: 27.1 pg (ref 26.0–34.0)
MCHC: 32.8 g/dL (ref 30.0–36.0)
MCV: 82.5 fL (ref 80.0–100.0)
Monocytes Absolute: 1.4 10*3/uL — ABNORMAL HIGH (ref 0.1–1.0)
Monocytes Relative: 12 %
Neutro Abs: 5.8 10*3/uL (ref 1.7–7.7)
Neutrophils Relative %: 50 %
Platelets: 558 10*3/uL — ABNORMAL HIGH (ref 150–400)
RBC: 3.77 MIL/uL — ABNORMAL LOW (ref 3.87–5.11)
RDW: 16.8 % — ABNORMAL HIGH (ref 11.5–15.5)
WBC: 11.5 10*3/uL — ABNORMAL HIGH (ref 4.0–10.5)
nRBC: 0 % (ref 0.0–0.2)

## 2023-05-28 LAB — ECHOCARDIOGRAM COMPLETE
AR max vel: 2.63 cm2
AV Area VTI: 2.69 cm2
AV Area mean vel: 2.39 cm2
AV Mean grad: 6 mmHg
AV Peak grad: 9.5 mmHg
Ao pk vel: 1.54 m/s
Area-P 1/2: 3.2 cm2
Height: 64 in
MV VTI: 1.98 cm2
S' Lateral: 2.6 cm
Weight: 2920 [oz_av]

## 2023-05-28 LAB — CK: Total CK: 173 U/L (ref 38–234)

## 2023-05-28 LAB — BRAIN NATRIURETIC PEPTIDE: B Natriuretic Peptide: 25.2 pg/mL (ref 0.0–100.0)

## 2023-05-28 MED ORDER — NICOTINE 14 MG/24HR TD PT24
14.0000 mg | MEDICATED_PATCH | Freq: Every day | TRANSDERMAL | Status: DC
  Administered 2023-05-28: 14 mg via TRANSDERMAL
  Filled 2023-05-28 (×2): qty 1

## 2023-05-28 NOTE — Assessment & Plan Note (Signed)
 Management per psych team

## 2023-05-28 NOTE — Plan of Care (Signed)
   Problem: Self-Concept: Goal: Will verbalize positive feelings about self Outcome: Progressing Goal: Level of anxiety will decrease Outcome: Progressing

## 2023-05-28 NOTE — Assessment & Plan Note (Signed)
 Bilateral lower extremity pain and swelling times roughly 1 week in the setting of recent admission for Intentional overdose with toxic encephalopathy due to overdose from sedative medications Positive history of cocaine use Positive mild orthopnea No hypoxia or chest pain present 1-2+ edema in lower extremities bilaterally Will plan for fluid retention evaluation including: EKG  BNP Chest x-ray CBC and CMP Lower extremity venous ultrasound 2D echo Discussed substance abuse avoidance, low salt diet

## 2023-05-28 NOTE — Plan of Care (Signed)
   05/28/23 0300  Psych Admission Type (Psych Patients Only)  Admission Status Voluntary  Psychosocial Assessment  Patient Complaints Depression  Eye Contact Brief  Facial Expression Worried  Affect Depressed  Speech Logical/coherent  Interaction Assertive  Motor Activity Slow  Appearance/Hygiene In scrubs  Behavior Characteristics Cooperative  Mood Depressed  Thought Process  Coherency WDL  Content WDL  Delusions WDL  Perception WDL  Hallucination None reported or observed  Judgment Impaired  Confusion None  Danger to Self  Current suicidal ideation? Denies  Danger to Others  Danger to Others None reported or observed   Problem: Education: Goal: Utilization of techniques to improve thought processes will improve Outcome: Progressing Goal: Knowledge of the prescribed therapeutic regimen will improve Outcome: Progressing   Problem: Activity: Goal: Interest or engagement in leisure activities will improve Outcome: Progressing Goal: Imbalance in normal sleep/wake cycle will improve Outcome: Progressing

## 2023-05-28 NOTE — Group Note (Signed)
 Date:  05/28/2023 Time:  9:15 PM  Group Topic/Focus:  Making Healthy Choices:   The focus of this group is to help patients identify negative/unhealthy choices they were using prior to admission and identify positive/healthier coping strategies to replace them upon discharge.    Participation Level:  Active  Participation Quality:  Appropriate  Affect:  Appropriate  Cognitive:  Appropriate  Insight: Good  Engagement in Group:  Engaged  Modes of Intervention:  Discussion  Additional Comments:    Burt Ek 05/28/2023, 9:15 PM

## 2023-05-28 NOTE — Consult Note (Signed)
 Initial Consultation Note   Patient: Natalie Edwards ZOX:[id number] DOB: 10-Jul-1965 PCP: [name], MD DOA: 05/26/2023 DOS: the patient was seen and examined on 05/28/2023 Primary service: Verner Chol, MD  Referring physician: Verner Chol, MD Reason for consult: Lower extremity swelling   Assessment/Plan: Assessment and Plan: Pain and swelling of lower extremity Bilateral lower extremity pain and swelling times roughly 1 week in the setting of recent admission for Intentional overdose with toxic encephalopathy due to overdose from sedative medications Positive history of cocaine use Positive mild orthopnea No hypoxia or chest pain present 1-2+ edema in lower extremities bilaterally Will plan for fluid retention evaluation including: EKG  BNP Chest x-ray CBC and CMP Lower extremity venous ultrasound 2D echo Discussed substance abuse avoidance, low salt diet   Essential hypertension BP stable  Cont w/ norvasc and lotensin    Cocaine abuse (HCC) Noted cocaine abuse  Major confounder to LE swelling  Discussed cessation  Repeat urine drug screen   Ingestion of substance, intentional self-harm, sequela (HCC) Management per primary team    MDD (major depressive disorder), recurrent severe, without psychosis (HCC) Management per psych team         [facility] will continue to follow the patient.  HPI: Natalie Edwards is a 58 y.o. female with past medical history of HTN, GERD, anxiety and depression, Hx of SI with suicide attempts, hx of gastric ulcers, insomnia currently admitted on the psych service for intentional drug overdose.  Received consult for evaluation for lower extremity swelling and pain.  Patient noted to have been admitted March 7 through March 11 for intentional drug overdose with toxic encephalopathy secondary to multiple sedating medications as well as SIRS/sepsis secondary to UTI.[name]  Patient reports having lower extremity swelling for roughly 1 week.  Denies  any known prior history of heart disease.  Noted cocaine use.  Has had some intermittent orthopnea.  Denies any high salt intake or NSAID use.  Intermittent alcohol use.  No chest pain.  No focal hemiparesis or confusion.  Denies any known history of blood clots in the past.  No recent trauma.  In review of chart, patient had?  EKG with atrial fibrillation during prior admission.  Other EKGs were otherwise normal sinus rhythm. Currently on the behavioral surface afebrile, hemodynamically stable.  Labs including CBC, CMP pending.  Review of Systems: As mentioned in the history of present illness. All other systems reviewed and are negative. Past Medical History:  Diagnosis Date   Allergy    Anemia    Diverticulosis    Gall bladder stones 1993   Gastric ulcer    GERD (gastroesophageal reflux disease)    Hypertension    Renal mass    Past Surgical History:  Procedure Laterality Date   ABDOMINAL HYSTERECTOMY     BIOPSY  09/29/2022   Procedure: BIOPSY;  Surgeon: Napoleon Form, MD;  Location: [facility] ENDOSCOPY;  Service: Gastroenterology;;   CHOLECYSTECTOMY     COLONOSCOPY WITH ESOPHAGOGASTRODUODENOSCOPY (EGD)  04/07/2023   [facility]   ESOPHAGOGASTRODUODENOSCOPY (EGD) WITH PROPOFOL N/A 09/29/2022   Procedure: ESOPHAGOGASTRODUODENOSCOPY (EGD) WITH PROPOFOL;  Surgeon: Napoleon Form, MD;  Location: [facility] ENDOSCOPY;  Service: Gastroenterology;  Laterality: N/A;   Social History:  reports that she has been smoking cigarettes. She has a 12.5 pack-year smoking history. She has never used smokeless tobacco. She reports current alcohol use. She reports that she does not use drugs.  Allergies  Allergen Reactions   Hydroxyzine Other (See Comments)  Palpitations and jitteriness    Ambien [Zolpidem Tartrate] Other (See Comments)    Jitteriness, nervousness, abdominal pain   Ibuprofen     Family History  Problem Relation Age of Onset   Cancer Maternal Uncle        Lung   Cancer  Maternal Uncle        Lung   Cancer Maternal Uncle        Lung   Headache Neg Hx        "I don't think so"   Migraines Neg Hx        "I don't think so"    Prior to Admission medications   Medication Sig Start Date End Date Taking? Authorizing Provider  acetaminophen (TYLENOL) 500 MG tablet Take 500 mg by mouth every 6 (six) hours as needed for moderate pain (pain score 4-6).    [provider]  amLODipine (NORVASC) 10 MG tablet Take 1 tablet (10 mg total) by mouth daily. 05/26/23   Leroy Sea, MD  ciprofloxacin (CIPRO) 500 MG tablet Take 1 tablet (500 mg total) by mouth 2 (two) times daily. 05/26/23   Leroy Sea, MD  dicyclomine (BENTYL) 20 MG tablet Take 1 tablet (20 mg total) by mouth 3 (three) times daily before meals. 03/10/23   [name], MD  FLUoxetine (PROZAC) 20 MG capsule Take 1 capsule (20 mg total) by mouth daily. 12/28/22   Sarina Ill, DO  mirtazapine (REMERON) 45 MG tablet TAKE 1 TABLET BY MOUTH AT  BEDTIME 04/03/23   [name], MD  pantoprazole (PROTONIX) 40 MG tablet Take 1 tablet (40 mg total) by mouth daily. Patient taking differently: Take 40 mg by mouth daily as needed (acid reflux). 12/28/22   Sarina Ill, DO    Physical Exam: Vitals:   05/26/23 1916 05/27/23 0737 05/27/23 1922 05/28/23 0729  BP: 125/76 131/87 114/67 115/65  Pulse: 82 94 90 77  Resp:  18  18  Temp: 98.4 F (36.9 C) 98.5 F (36.9 C) 98.4 F (36.9 C) 98.7 F (37.1 C)  TempSrc:      SpO2: 98% 98% 99% 98%  Weight:      Height:       Physical Exam Constitutional:      Appearance: She is normal weight.  HENT:     Head: Normocephalic and atraumatic.     Nose: Nose normal.  Eyes:     Pupils: Pupils are equal, round, and reactive to light.  Cardiovascular:     Rate and Rhythm: Normal rate and regular rhythm.  Pulmonary:     Effort: Pulmonary effort is normal.  Abdominal:     General: Bowel sounds are normal.  Musculoskeletal:      Cervical back: Normal range of motion.     Right lower leg: Edema present.     Left lower leg: Edema present.  Skin:    General: Skin is warm.  Neurological:     General: No focal deficit present.  Psychiatric:        Mood and Affect: Mood normal.     Data Reviewed:   There are no new results to review at this time.    Family Communication: No family at the bedside  Primary team communication: Plan of care discussed w/ Verner Chol, MD Thank you very much for involving [place] in the care of your patient.  Author: Floydene Flock, MD 05/28/2023 10:43 AM  For on call review [email or web address].

## 2023-05-28 NOTE — Progress Notes (Signed)
 [facility] MD Progress Note  05/28/2023 9:49 AM Natalie Edwards  MRN:  [id number]  Natalie Edwards is a 58 y.o. female admitted: Presented to the [facility] 05/22/2023  6:47 PM for cocaine overdose. She carries the psychiatric diagnoses of Major depressive disorder, recurrent without psychotic features, Cocaine abuse and previous suicide attempt, and has a past medical history of HTN, GERD. Her current presentation of recent cocaine overdose with past history of MDD and suicide attempts is most consistent with major depressive disorder, recurrent vs cocaine induced mood disorder. She meets criteria for inpatient psychiatric admission based on recent cocaine overdose and risk of harm to self.  Patient is admitted to the geropsychiatry unit for further stabilization  Subjective:  Chart reviewed, case discussed in multidisciplinary meeting, patient seen during rounds.  On interview patient is noted to be sitting in the dayroom with her feet elevated, watching TV.  She reports that the pain in her legs is improving with her feet elevated.  She still has pitting edema bilaterally.  She denies having any heart problems.  Patient was educated about hospitalist consult given her recent history of overdose on cocaine, abnormal EKG on 05/22/2023.  She denies SI/HI/intent/plan.  She denies auditory/visual hallucinations.  She has fair appetite and sleep.  Per nursing report no problems noted.   Sleep: Fair  Appetite:  Fair  Past Psychiatric History: see h&P Family History:  Family History  Problem Relation Age of Onset   Cancer Maternal Uncle        Lung   Cancer Maternal Uncle        Lung   Cancer Maternal Uncle        Lung   Headache Neg Hx        "I don't think so"   Migraines Neg Hx        "I don't think so"   Social History:  Social History   Substance and Sexual Activity  Alcohol Use Yes   Comment: occasionally - last drink was July 4, 1 shot     Social History   Substance and Sexual Activity  Drug Use No     Social History   Socioeconomic History   Marital status: Single    Spouse name: Not on file   Number of children: 3   Years of education: Not on file   Highest education level: 12th grade  Occupational History   Occupation: environmental services at [employer]  Tobacco Use   Smoking status: Every Day    Current packs/day: 0.50    Average packs/day: 0.5 packs/day for 25.0 years (12.5 ttl pk-yrs)    Types: Cigarettes   Smokeless tobacco: Never   Tobacco comments:    Pt tried to quit - helps with anxiety     1 pack last patient 3 days   Vaping Use   Vaping status: Never Used  Substance and Sexual Activity   Alcohol use: Yes    Comment: occasionally - last drink was July 4, 1 shot   Drug use: No   Sexual activity: Yes    Comment: hysterectomy  Other Topics Concern   Not on file  Social History Narrative   Lives at home in an apartment. Her mother lives with her.    Right handed   Caffeine: drinks approx. 36 oz of pepsi per day. Sometimes drinks 2 cups of coffee in a day as well.    Social Drivers of Health   Financial Resource Strain: Low Risk  (03/06/2023)   Overall  Financial Resource Strain (CARDIA)    Difficulty of Paying Living Expenses: Not very hard  Food Insecurity: Patient Declined (05/26/2023)   Hunger Vital Sign    Worried About Running Out of Food in the Last Year: Patient declined    Ran Out of Food in the Last Year: Patient declined  Recent Concern: Food Insecurity - Food Insecurity Present (05/24/2023)   Hunger Vital Sign    Worried About Running Out of Food in the Last Year: Sometimes true    Ran Out of Food in the Last Year: Often true  Transportation Needs: No Transportation Needs (05/26/2023)   PRAPARE - Administrator, Civil Service (Medical): No    Lack of Transportation (Non-Medical): No  Recent Concern: Transportation Needs - Unmet Transportation Needs (05/24/2023)   PRAPARE - Administrator, Civil Service (Medical): Yes     Lack of Transportation (Non-Medical): No  Physical Activity: Sufficiently Active (05/22/2022)   Exercise Vital Sign    Days of Exercise per Week: 4 days    Minutes of Exercise per Session: 70 min  Stress: Stress Concern Present (03/06/2023)   [employer] of Occupational Health - Occupational Stress Questionnaire    Feeling of Stress : Very much  Social Connections: Socially Isolated (05/26/2023)   Social Connection and Isolation Panel [NHANES]    Frequency of Communication with Friends and Family: More than three times a week    Frequency of Social Gatherings with Friends and Family: More than three times a week    Attends Religious Services: Never    Database administrator or Organizations: No    Attends Engineer, structural: Never    Marital Status: Divorced   Past Medical History:  Past Medical History:  Diagnosis Date   Allergy    Anemia    Diverticulosis    Gall bladder stones 1993   Gastric ulcer    GERD (gastroesophageal reflux disease)    Hypertension    Renal mass     Past Surgical History:  Procedure Laterality Date   ABDOMINAL HYSTERECTOMY     BIOPSY  09/29/2022   Procedure: BIOPSY;  Surgeon: Napoleon Form, MD;  Location: [facility] ENDOSCOPY;  Service: Gastroenterology;;   CHOLECYSTECTOMY     COLONOSCOPY WITH ESOPHAGOGASTRODUODENOSCOPY (EGD)  04/07/2023   [facility] at [facility]   ESOPHAGOGASTRODUODENOSCOPY (EGD) WITH PROPOFOL N/A 09/29/2022   Procedure: ESOPHAGOGASTRODUODENOSCOPY (EGD) WITH PROPOFOL;  Surgeon: Napoleon Form, MD;  Location: [facility] ENDOSCOPY;  Service: Gastroenterology;  Laterality: N/A;    Current Medications: Current Facility-Administered Medications  Medication Dose Route Frequency Provider Last Rate Last Admin   acetaminophen (TYLENOL) tablet 650 mg  650 mg Oral Q6H PRN Maryagnes Amos, FNP   650 mg at 05/28/23 0505   albuterol (PROVENTIL) (2.5 MG/3ML) 0.083% nebulizer solution 2.5 mg  2.5 mg Nebulization Q2H PRN Starkes-Perry,  Juel Burrow, FNP       alum & mag hydroxide-simeth (MAALOX/MYLANTA) 200-200-20 MG/5ML suspension 30 mL  30 mL Oral Q4H PRN Starkes-Perry, Juel Burrow, FNP       amLODipine (NORVASC) tablet 10 mg  10 mg Oral Daily Maryagnes Amos, FNP   10 mg at 05/28/23 9528   And   benazepril (LOTENSIN) tablet 40 mg  40 mg Oral Daily Maryagnes Amos, FNP   40 mg at 05/28/23 0947   FLUoxetine (PROZAC) capsule 20 mg  20 mg Oral Daily Maryagnes Amos, FNP   20 mg at 05/28/23 0947   magnesium  hydroxide (MILK OF MAGNESIA) suspension 30 mL  30 mL Oral Daily PRN Starkes-Perry, Juel Burrow, FNP       mirtazapine (REMERON) tablet 30 mg  30 mg Oral QHS Maryagnes Amos, FNP   30 mg at 05/27/23 2054   naphazoline-glycerin (CLEAR EYES REDNESS) ophth solution 1-2 drop  1-2 drop Both Eyes QID PRN Starkes-Perry, Juel Burrow, FNP       OLANZapine (ZYPREXA) injection 5 mg  5 mg Intramuscular TID PRN Starkes-Perry, Juel Burrow, FNP       OLANZapine zydis (ZYPREXA) disintegrating tablet 5 mg  5 mg Oral TID PRN Rosario Adie, Juel Burrow, FNP       ondansetron (ZOFRAN) tablet 4 mg  4 mg Oral Q6H PRN Starkes-Perry, Juel Burrow, FNP       Or   ondansetron (ZOFRAN) injection 4 mg  4 mg Intravenous Q6H PRN Starkes-Perry, Juel Burrow, FNP       pantoprazole (PROTONIX) EC tablet 40 mg  40 mg Oral Daily Maryagnes Amos, FNP   40 mg at 05/28/23 0946   risperiDONE (RISPERDAL) tablet 0.5 mg  0.5 mg Oral Daily Maryagnes Amos, FNP   0.5 mg at 05/28/23 0946   traMADol (ULTRAM) tablet 50 mg  50 mg Oral Q12H PRN Maryagnes Amos, FNP   50 mg at 05/27/23 2051   traZODone (DESYREL) tablet 50 mg  50 mg Oral QHS Verner Chol, MD   50 mg at 05/27/23 2051    Lab Results: No results found for this or any previous visit (from the past 48 hours).  Blood Alcohol level:  Lab Results  Component Value Date   ETH <10 05/22/2023   ETH <10 12/22/2022    Metabolic Disorder Labs: Lab Results  Component Value Date   HGBA1C 5.2  05/22/2023   MPG 102.54 05/22/2023   No results found for: "PROLACTIN" Lab Results  Component Value Date   CHOL 202 (H) 12/13/2021   TRIG 113 12/13/2021   HDL 76 12/13/2021   CHOLHDL 2.7 12/13/2021   LDLCALC 106 (H) 12/13/2021   LDLCALC 104 (H) 05/31/2018    Physical Findings: AIMS:  , ,  ,  ,    CIWA:    COWS:      Psychiatric Specialty Exam:  Presentation  General Appearance:  Appropriate for Environment; Casual  Eye Contact: Fair  Speech: Clear and Coherent  Speech Volume: Normal    Mood and Affect  Mood: Depressed  Affect: Depressed   Thought Process  Thought Processes: Coherent  Descriptions of Associations:Intact  Orientation:Full (Time, Place and Person)  Thought Content:Logical  Hallucinations:Hallucinations: None  Ideas of Reference:None  Suicidal Thoughts:Suicidal Thoughts: No  Homicidal Thoughts:Homicidal Thoughts: No   Sensorium  Memory: Immediate Fair; Recent Fair; Remote Fair  Judgment: Impaired  Insight: None   Executive Functions  Concentration: Fair  Attention Span: Fair  Recall: [employer] of Knowledge: Fair  Language: Fair   Psychomotor Activity  Psychomotor Activity: Psychomotor Activity: Normal  Musculoskeletal: Strength & Muscle Tone: within normal limits Gait & Station: normal Assets  Assets: Manufacturing systems engineer; Desire for Improvement; Resilience    Physical Exam: Physical Exam Vitals and nursing note reviewed.  HENT:     Head: Normocephalic.     Right Ear: Tympanic membrane normal.     Nose: Nose normal.     Mouth/Throat:     Mouth: Mucous membranes are moist.  Cardiovascular:     Rate and Rhythm: Normal rate.     Pulses: Normal  pulses.  Pulmonary:     Breath sounds: Normal breath sounds.  Abdominal:     General: Bowel sounds are normal.  Skin:    General: Skin is warm.  Neurological:     General: No focal deficit present.     Mental Status: She is alert.     ROS Blood pressure 115/65, pulse 77, temperature 98.7 F (37.1 C), resp. rate 18, height 5\' 4"  (1.626 m), weight 82.8 kg, SpO2 98%. Body mass index is 31.33 kg/m.  Diagnosis: Principal Problem:   MDD (major depressive disorder), recurrent episode, severe (HCC)  Clinical Decision Making: Patient was recently medically admitted after being found unresponsive in her house with possible overdose on her medications of cocaine.  Patient displays symptoms of depression, anhedonia, hopelessness in the context of multiple psychosocial stressors including death of her mom, homelessness, losing job etc. patient needs inpatient hospitalization for further stabilization.   Treatment Plan Summary:   Safety and Monitoring:             -- Involuntary admission to inpatient psychiatric unit for safety, stabilization and treatment             -- Daily contact with patient to assess and evaluate symptoms and progress in treatment             -- Patient's case to be discussed in multi-disciplinary team meeting             -- Observation Level: q15 minute checks             -- Vital signs:  q12 hours             -- Precautions: suicide, elopement, and assault   2. Psychiatric Diagnoses and Treatment:               In the ED got restarted on her Prozac 20 mg daily, Remeron 30 mg nightly Risperdal 0.5 mg daily, added trazodone 50 mg nightly for insomnia   -- The risks/benefits/side-effects/alternatives to this medication were discussed in detail with the patient and time was given for questions. The patient consents to medication trial.                -- Metabolic profile and EKG monitoring obtained while on an atypical antipsychotic (BMI: Lipid Panel: HbgA1c: QTc:)              -- Encouraged patient to participate in unit milieu and in scheduled group therapies                            3. Medical Issues Being Addressed:  Bilateral pitting edema of lower extremities-hospitalist consult made and EKG  stat ordered   4. Discharge Planning:              -- Social work and case management to assist with discharge planning and identification of hospital follow-up needs prior to discharge             -- Estimated LOS: 5-7 days             -- Discharge Concerns: Need to establish a safety plan; Medication compliance and effectiveness             -- Discharge Goals: Return home with outpatient referrals follow ups   Physician Treatment Plan for Primary Diagnosis: MDD (major depressive disorder), recurrent episode, severe (HCC) Long Term Goal(s): Improvement in symptoms so as ready for discharge  Short Term Goals: Ability to identify changes in lifestyle to reduce recurrence of condition will improve, Ability to verbalize feelings will improve, Ability to disclose and discuss suicidal ideas, Ability to demonstrate self-control will improve, and Ability to identify and develop effective coping behaviors will improve   Physician Treatment Plan for Secondary Diagnosis: Principal Problem:   MDD (major depressive disorder), recurrent episode, severe (HCC)   Long Term Goal(s): Improvement in symptoms so as ready for discharge   Short Term Goals: Ability to identify changes in lifestyle to reduce recurrence of condition will improve, Ability to verbalize feelings will improve, Ability to disclose and discuss suicidal ideas, Ability to demonstrate self-control will improve, Ability to identify and develop effective coping behaviors will improve, Ability to maintain clinical measurements within normal limits will improve, Compliance with prescribed medications will improve, and Ability to identify triggers associated with substance abuse/mental health issues will improve   Verner Chol, MD 05/28/2023, 9:49 AM

## 2023-05-28 NOTE — Progress Notes (Signed)
*  PRELIMINARY RESULTS* Echocardiogram 2D Echocardiogram has been performed.  Natalie Edwards 05/28/2023, 1:15 PM

## 2023-05-28 NOTE — Assessment & Plan Note (Signed)
 Noted cocaine abuse  Major confounder to LE swelling  Discussed cessation  Repeat urine drug screen

## 2023-05-28 NOTE — Assessment & Plan Note (Signed)
 BP stable  Cont w/ norvasc and lotensin

## 2023-05-28 NOTE — [facility] Counselor (Signed)
 CSW attempted to complete assessment.   CSW informed by nursing staff that pt was about to be taken for testing.    CSW will attempt again at a later time.    Reynaldo Minium, MSW, [place] 05/28/2023 3:30 PM

## 2023-05-28 NOTE — Assessment & Plan Note (Signed)
 Management per primary team

## 2023-05-29 DIAGNOSIS — M7989 Other specified soft tissue disorders: Secondary | ICD-10-CM

## 2023-05-29 DIAGNOSIS — F332 Major depressive disorder, recurrent severe without psychotic features: Secondary | ICD-10-CM | POA: Diagnosis not present

## 2023-05-29 DIAGNOSIS — F1410 Cocaine abuse, uncomplicated: Secondary | ICD-10-CM

## 2023-05-29 DIAGNOSIS — M79606 Pain in leg, unspecified: Secondary | ICD-10-CM

## 2023-05-29 DIAGNOSIS — I10 Essential (primary) hypertension: Secondary | ICD-10-CM

## 2023-05-29 LAB — BASIC METABOLIC PANEL
Anion gap: 7 (ref 5–15)
BUN: 20 mg/dL (ref 6–20)
CO2: 25 mmol/L (ref 22–32)
Calcium: 9.1 mg/dL (ref 8.9–10.3)
Chloride: 106 mmol/L (ref 98–111)
Creatinine, Ser: 1.02 mg/dL — ABNORMAL HIGH (ref 0.44–1.00)
GFR, Estimated: >60 mL/min (ref >60)
Glucose, Bld: 101 mg/dL — ABNORMAL HIGH (ref 70–99)
Potassium: 3.9 mmol/L (ref 3.5–5.1)
Sodium: 138 mmol/L (ref 135–145)

## 2023-05-29 LAB — BRAIN NATRIURETIC PEPTIDE: B Natriuretic Peptide: 15.8 pg/mL (ref 0.0–100.0)

## 2023-05-29 LAB — LIPID PANEL
Cholesterol: 183 mg/dL (ref 0–200)
HDL: 63 mg/dL (ref >40)
LDL Cholesterol: 103 mg/dL — ABNORMAL HIGH (ref 0–99)
Total CHOL/HDL Ratio: 2.9 ratio
Triglycerides: 87 mg/dL (ref <150)
VLDL: 17 mg/dL (ref 0–40)

## 2023-05-29 MED ORDER — NICOTINE 21 MG/24HR TD PT24
21.0000 mg | MEDICATED_PATCH | Freq: Every day | TRANSDERMAL | Status: DC
  Administered 2023-05-29 – 2023-06-11 (×14): 21 mg via TRANSDERMAL
  Filled 2023-05-29 (×14): qty 1

## 2023-05-29 MED ORDER — HYDROCHLOROTHIAZIDE 25 MG PO TABS
25.0000 mg | ORAL_TABLET | Freq: Every day | ORAL | Status: DC
  Administered 2023-05-29 – 2023-06-11 (×14): 25 mg via ORAL
  Filled 2023-05-29 (×14): qty 1

## 2023-05-29 NOTE — Final Progress Note (Signed)
 Patient seen and examined.   Swelling of lower extremity Possibly due to CCB (norvasc) Positive history of cocaine use No hypoxia or chest pain present Normal CXR, BNP, Echo and neg Lower extremity venous ultrasound Discussed with patient & Primary team Will stop Norvasc and ACE-I - Switch to hydrochlorothiazide and monitor C/s pharmacy for electrolyte mgmt  Will sign off for now. Please reach out to me if any questions.  Time spent: 20 mins

## 2023-05-29 NOTE — Consult Note (Signed)
 PHARMACY CONSULT NOTE - ELECTROLYTES  Pharmacy Consult for Electrolyte Monitoring and Replacement   Recent Labs: Height: 5\' 4"  (162.6 cm) Weight: 82.8 kg (182 lb 8 oz) IBW/kg (Calculated) : 54.7 Estimated Creatinine Clearance: 62.5 mL/min (A) (by C-G formula based on SCr of 1.02 mg/dL (H)). Potassium (mmol/L)  Date Value  05/29/2023 3.9   Magnesium (mg/dL)  Date Value  16/12/9602 1.6 (L)   Calcium (mg/dL)  Date Value  54/11/8117 9.1   Albumin (g/dL)  Date Value  14/78/2956 3.2 (L)  10/08/2022 4.2   Phosphorus (mg/dL)  Date Value  21/30/8657 4.2   Sodium (mmol/L)  Date Value  05/29/2023 138  10/08/2022 144   Corrected Ca: 9.7 mg/dL  Assessment  Natalie Edwards is a 58 y.o. female presenting with overdose and MDD. PMH significant for HTN, GERD, anxiety and depression. Pharmacy has been consulted to monitor and replace electrolytes.  Diet: Regular MIVF: N/A Pertinent medications: hydrochlorothiazide  Goal of Therapy: Electrolytes WNL  Plan:  No replacement indicated at this time. Check BMP, Mg, Phos with AM labs  Thank you for allowing pharmacy to be a part of this patient's care.  Barrie Folk, PharmD Clinical Pharmacist 05/29/2023 1:05 PM

## 2023-05-29 NOTE — Group Note (Signed)
 Therapy Group Note  Group Topic:Other Neurogenic Art Group Date: 05/29/2023 Start Time: 1300 End Time: 1350 Facilitators: Lottie Mussel, OT    Group Description: Group participated with Neurographic art activity, using watercolor paints to facilitate creative expression and meditation/relaxation for each individual.  Incorporated bimanual coordination, mental focus, emotional processing, task/command following and relaxation techniques as appropriate.  Patients engaged socially with therapist and other group participants throughout session. Allowed to ask questions as appropriate, and encouraged to identify ways they could use/share their creations with themselves and others.   Therapeutic Goal(s): Demonstrate ability to independently manipulate utensils required to participate with and complete activity. Demonstrate ability to cognitively focus on task and follow commands necessary for completion. Demonstrate use of art as an outlet for emotional processing and expression. Identify and demonstrate importance of relaxation, neural calming and meditation for improved participation with life groups.   Individual Participation: Pt chose to remain seated on couch and shared that she would just observe.     Participation Level: Non-verbal   Participation Quality: Pt did not participate actively, left halfway through.   Behavior: Disinterested, Isolative, and On-looking   Speech/Thought Process: Did not speak   Affect/Mood: Flat   Insight: N/A   Judgement: N/A   Modes of Intervention: Activity, Discussion, Education, and Socialization  Patient Response to Interventions:  Avoidant and Disengaged   Plan: Continue to engage patient in OT groups 1-2x/week.   Arman Filter., MPH, MS, OTR/L ascom [phone] 05/29/23, 3:15 PM

## 2023-05-29 NOTE — Plan of Care (Signed)
 D: Pt alert and oriented. Pt denies experiencing any anxiety/depression at this time. Pt denies experiencing any pain at this time. Pt denies experiencing any SI/HI, or AVH at this time.   A: Scheduled medications administered to pt, per MD orders. Support and encouragement provided. Frequent verbal contact made. Routine safety checks conducted q15 minutes.   R: No adverse drug reactions noted. Pt verbally contracts for safety at this time. Pt compliant with medications and treatment plan. Pt interacts well with others on the unit. Pt remains safe at this time. Plan of care ongoing.  Problem: Activity: Goal: Interest or engagement in leisure activities will improve Outcome: Progressing   Problem: Coping: Goal: Will verbalize feelings Outcome: Progressing

## 2023-05-29 NOTE — Plan of Care (Signed)
   05/28/23 2053  Psych Admission Type (Psych Patients Only)  Admission Status Voluntary  Psychosocial Assessment  Patient Complaints Depression  Eye Contact Fair  Facial Expression Worried  Affect Depressed  Speech Logical/coherent  Interaction Assertive  Motor Activity Slow  Appearance/Hygiene In scrubs  Behavior Characteristics Cooperative  Mood Depressed  Thought Process  Coherency WDL  Content WDL  Delusions None reported or observed  Perception WDL  Hallucination None reported or observed  Judgment Impaired  Confusion None  Danger to Self  Current suicidal ideation? Denies  Danger to Others  Danger to Others None reported or observed   Problem: Education: Goal: Utilization of techniques to improve thought processes will improve Outcome: Progressing Goal: Knowledge of the prescribed therapeutic regimen will improve Outcome: Progressing   Problem: Activity: Goal: Interest or engagement in leisure activities will improve Outcome: Progressing Goal: Imbalance in normal sleep/wake cycle will improve Outcome: Progressing

## 2023-05-29 NOTE — Progress Notes (Signed)
 [facility] MD Progress Note  05/29/2023 4:46 PM Natalie Edwards  MRN:  [id number]  Natalie Edwards is a 58 y.o. female admitted: Presented to the [facility] 05/22/2023  6:47 PM for cocaine overdose. She carries the psychiatric diagnoses of Major depressive disorder, recurrent without psychotic features, Cocaine abuse and previous suicide attempt, and has a past medical history of HTN, GERD. Her current presentation of recent cocaine overdose with past history of MDD and suicide attempts is most consistent with major depressive disorder, recurrent vs cocaine induced mood disorder. She meets criteria for inpatient psychiatric admission based on recent cocaine overdose and risk of harm to self.  Patient is admitted to the geropsychiatry unit for further stabilization   Subjective:  Chart reviewed, case discussed in multidisciplinary meeting, patient seen during rounds.  Patient is noted to be in a good mood.  She was completing her assessment with the Child psychotherapist.  She did acknowledge that the hospitalist came by to check on her and the pedal edema might be due to the calcium channel blockers that she is taking for her high blood pressure.  She is aware that her blood pressure medications were changed to remove the extra fluid from her body.  She denies SI/HI/plan.  She reports her depression is 5 out of 10, 10 being the worst and anxiety as 5 out of 10.  She denies auditory/visual hallucinations.  She has fair appetite and sleep.   Sleep: Fair  Appetite:  Fair  Past Psychiatric History: see h&P Family History:  Family History  Problem Relation Age of Onset   Cancer Maternal Uncle        Lung   Cancer Maternal Uncle        Lung   Cancer Maternal Uncle        Lung   Headache Neg Hx        "I don't think so"   Migraines Neg Hx        "I don't think so"   Social History:  Social History   Substance and Sexual Activity  Alcohol Use Yes   Comment: occasionally - last drink was July 4, 1 shot     Social  History   Substance and Sexual Activity  Drug Use No    Social History   Socioeconomic History   Marital status: Single    Spouse name: Not on file   Number of children: 3   Years of education: Not on file   Highest education level: 12th grade  Occupational History   Occupation: environmental services at [employer]  Tobacco Use   Smoking status: Every Day    Current packs/day: 0.50    Average packs/day: 0.5 packs/day for 25.0 years (12.5 ttl pk-yrs)    Types: Cigarettes   Smokeless tobacco: Never   Tobacco comments:    Pt tried to quit - helps with anxiety     1 pack last patient 3 days   Vaping Use   Vaping status: Never Used  Substance and Sexual Activity   Alcohol use: Yes    Comment: occasionally - last drink was July 4, 1 shot   Drug use: No   Sexual activity: Yes    Comment: hysterectomy  Other Topics Concern   Not on file  Social History Narrative   Lives at home in an apartment. Her mother lives with her.    Right handed   Caffeine: drinks approx. 36 oz of pepsi per day. Sometimes drinks 2 cups of coffee in a  day as well.    Social Drivers of Corporate investment banker Strain: Low Risk  (03/06/2023)   Overall Financial Resource Strain (CARDIA)    Difficulty of Paying Living Expenses: Not very hard  Food Insecurity: Patient Declined (05/26/2023)   Hunger Vital Sign    Worried About Running Out of Food in the Last Year: Patient declined    Ran Out of Food in the Last Year: Patient declined  Recent Concern: Food Insecurity - Food Insecurity Present (05/24/2023)   Hunger Vital Sign    Worried About Running Out of Food in the Last Year: Sometimes true    Ran Out of Food in the Last Year: Often true  Transportation Needs: No Transportation Needs (05/26/2023)   PRAPARE - Administrator, Civil Service (Medical): No    Lack of Transportation (Non-Medical): No  Recent Concern: Transportation Needs - Unmet Transportation Needs (05/24/2023)   PRAPARE -  Administrator, Civil Service (Medical): Yes    Lack of Transportation (Non-Medical): No  Physical Activity: Sufficiently Active (05/22/2022)   Exercise Vital Sign    Days of Exercise per Week: 4 days    Minutes of Exercise per Session: 70 min  Stress: Stress Concern Present (03/06/2023)   [employer] of Occupational Health - Occupational Stress Questionnaire    Feeling of Stress : Very much  Social Connections: Socially Isolated (05/26/2023)   Social Connection and Isolation Panel [NHANES]    Frequency of Communication with Friends and Family: More than three times a week    Frequency of Social Gatherings with Friends and Family: More than three times a week    Attends Religious Services: Never    Database administrator or Organizations: No    Attends Engineer, structural: Never    Marital Status: Divorced   Past Medical History:  Past Medical History:  Diagnosis Date   Allergy    Anemia    Diverticulosis    Gall bladder stones 1993   Gastric ulcer    GERD (gastroesophageal reflux disease)    Hypertension    Renal mass     Past Surgical History:  Procedure Laterality Date   ABDOMINAL HYSTERECTOMY     BIOPSY  09/29/2022   Procedure: BIOPSY;  Surgeon: Napoleon Form, MD;  Location: [facility];  Service: Gastroenterology;;   CHOLECYSTECTOMY     COLONOSCOPY WITH ESOPHAGOGASTRODUODENOSCOPY (EGD)  04/07/2023   [facility]   ESOPHAGOGASTRODUODENOSCOPY (EGD) WITH PROPOFOL N/A 09/29/2022   Procedure: ESOPHAGOGASTRODUODENOSCOPY (EGD) WITH PROPOFOL;  Surgeon: Napoleon Form, MD;  Location: [facility];  Service: Gastroenterology;  Laterality: N/A;    Current Medications: Current Facility-Administered Medications  Medication Dose Route Frequency Provider Last Rate Last Admin   acetaminophen (TYLENOL) tablet 650 mg  650 mg Oral Q6H PRN Maryagnes Amos, FNP   650 mg at 05/28/23 1612   albuterol (PROVENTIL) (2.5 MG/3ML) 0.083% nebulizer  solution 2.5 mg  2.5 mg Nebulization Q2H PRN Starkes-Perry, Juel Burrow, FNP       alum & mag hydroxide-simeth (MAALOX/MYLANTA) 200-200-20 MG/5ML suspension 30 mL  30 mL Oral Q4H PRN Starkes-Perry, Juel Burrow, FNP       FLUoxetine (PROZAC) capsule 20 mg  20 mg Oral Daily Maryagnes Amos, FNP   20 mg at 05/29/23 0906   hydrochlorothiazide (HYDRODIURIL) tablet 25 mg  25 mg Oral Daily Delfino Lovett, MD   25 mg at 05/29/23 1111   magnesium hydroxide (MILK OF MAGNESIA) suspension  30 mL  30 mL Oral Daily PRN Maryagnes Amos, FNP       mirtazapine (REMERON) tablet 30 mg  30 mg Oral QHS Maryagnes Amos, FNP   30 mg at 05/28/23 2117   naphazoline-glycerin (CLEAR EYES REDNESS) ophth solution 1-2 drop  1-2 drop Both Eyes QID PRN Starkes-Perry, Juel Burrow, FNP       nicotine (NICODERM CQ - dosed in mg/24 hours) patch 21 mg  21 mg Transdermal Daily Verner Chol, MD   21 mg at 05/29/23 0928   OLANZapine (ZYPREXA) injection 5 mg  5 mg Intramuscular TID PRN Starkes-Perry, Juel Burrow, FNP       OLANZapine zydis (ZYPREXA) disintegrating tablet 5 mg  5 mg Oral TID PRN Rosario Adie, Juel Burrow, FNP       ondansetron (ZOFRAN) tablet 4 mg  4 mg Oral Q6H PRN Starkes-Perry, Juel Burrow, FNP       Or   ondansetron (ZOFRAN) injection 4 mg  4 mg Intravenous Q6H PRN Starkes-Perry, Juel Burrow, FNP       pantoprazole (PROTONIX) EC tablet 40 mg  40 mg Oral Daily Maryagnes Amos, FNP   40 mg at 05/29/23 1610   risperiDONE (RISPERDAL) tablet 0.5 mg  0.5 mg Oral Daily Maryagnes Amos, FNP   0.5 mg at 05/29/23 9604   traMADol (ULTRAM) tablet 50 mg  50 mg Oral Q12H PRN Maryagnes Amos, FNP   50 mg at 05/29/23 0610   traZODone (DESYREL) tablet 50 mg  50 mg Oral QHS Verner Chol, MD   50 mg at 05/28/23 2117    Lab Results:  Results for orders placed or performed during the hospital encounter of 05/26/23 (from the past 48 hours)  Urine Drug Screen, Qualitative ([facility] only)     Status: None   Collection Time:  05/28/23 10:40 AM  Result Value Ref Range   Tricyclic, Ur Screen NONE DETECTED NONE DETECTED   Amphetamines, Ur Screen NONE DETECTED NONE DETECTED   MDMA (Ecstasy)Ur Screen NONE DETECTED NONE DETECTED   Cocaine Metabolite,Ur [place] NONE DETECTED NONE DETECTED   Opiate, Ur Screen NONE DETECTED NONE DETECTED   Phencyclidine (PCP) Ur S NONE DETECTED NONE DETECTED   Cannabinoid 50 Ng, Ur [place] NONE DETECTED NONE DETECTED   Barbiturates, Ur Screen NONE DETECTED NONE DETECTED   Benzodiazepine, Ur Scrn NONE DETECTED NONE DETECTED   Methadone Scn, Ur NONE DETECTED NONE DETECTED    Comment: (NOTE) Tricyclics + metabolites, urine    Cutoff 1000 ng/mL Amphetamines + metabolites, urine  Cutoff 1000 ng/mL MDMA (Ecstasy), urine              Cutoff 500 ng/mL Cocaine Metabolite, urine          Cutoff 300 ng/mL Opiate + metabolites, urine        Cutoff 300 ng/mL Phencyclidine (PCP), urine         Cutoff 25 ng/mL Cannabinoid, urine                 Cutoff 50 ng/mL Barbiturates + metabolites, urine  Cutoff 200 ng/mL Benzodiazepine, urine              Cutoff 200 ng/mL Methadone, urine                   Cutoff 300 ng/mL  The urine drug screen provides only a preliminary, unconfirmed analytical test result and should not be used for non-medical purposes. Clinical consideration and professional judgment should be applied  to any positive drug screen result due to possible interfering substances. A more specific alternate chemical method must be used in order to obtain a confirmed analytical result. Gas chromatography / mass spectrometry (GC/MS) is the preferred confirm atory method. Performed at [facility], [address] [place], [place] [address]   Brain natriuretic peptide     Status: None   Collection Time: 05/28/23 10:51 AM  Result Value Ref Range   B Natriuretic Peptide 25.2 0.0 - 100.0 pg/mL    Comment: Performed at [facility], [address] [place], [place] [address]  CBC  with Differential/Platelet     Status: Abnormal   Collection Time: 05/28/23 10:51 AM  Result Value Ref Range   WBC 11.5 (H) 4.0 - 10.5 K/uL   RBC 3.77 (L) 3.87 - 5.11 MIL/uL   Hemoglobin 10.2 (L) 12.0 - 15.0 g/dL   HCT 86.5 (L) 78.4 - 69.6 %   MCV 82.5 80.0 - 100.0 fL   MCH 27.1 26.0 - 34.0 pg   MCHC 32.8 30.0 - 36.0 g/dL   RDW 29.5 (H) 28.4 - 13.2 %   Platelets 558 (H) 150 - 400 K/uL   nRBC 0.0 0.0 - 0.2 %   Neutrophils Relative % 50 %   Neutro Abs 5.8 1.7 - 7.7 K/uL   Lymphocytes Relative 35 %   Lymphs Abs 4.0 0.7 - 4.0 K/uL   Monocytes Relative 12 %   Monocytes Absolute 1.4 (H) 0.1 - 1.0 K/uL   Eosinophils Relative 2 %   Eosinophils Absolute 0.2 0.0 - 0.5 K/uL   Basophils Relative 0 %   Basophils Absolute 0.0 0.0 - 0.1 K/uL   Immature Granulocytes 1 %   Abs Immature Granulocytes 0.07 0.00 - 0.07 K/uL    Comment: Performed at [facility], [address] [place], [place] [address]  Comprehensive metabolic panel     Status: Abnormal   Collection Time: 05/28/23 10:51 AM  Result Value Ref Range   Sodium 139 135 - 145 mmol/L   Potassium 3.6 3.5 - 5.1 mmol/L   Chloride 106 98 - 111 mmol/L   CO2 25 22 - 32 mmol/L   Glucose, Bld 99 70 - 99 mg/dL    Comment: Glucose reference range applies only to samples taken after fasting for at least 8 hours.   BUN 19 6 - 20 mg/dL   Creatinine, Ser 2.72 0.44 - 1.00 mg/dL   Calcium 8.9 8.9 - 53.6 mg/dL   Total Protein 6.6 6.5 - 8.1 g/dL   Albumin 3.2 (L) 3.5 - 5.0 g/dL   AST 29 15 - 41 U/L   ALT 36 0 - 44 U/L   Alkaline Phosphatase 48 38 - 126 U/L   Total Bilirubin 0.4 0.0 - 1.2 mg/dL   GFR, Estimated >64 >40 mL/min    Comment: (NOTE) Calculated using the CKD-EPI Creatinine Equation (2021)    Anion gap 8 5 - 15    Comment: Performed at [facility], [address] [place], [place] [address]  CK     Status: None   Collection Time: 05/28/23 10:51 AM  Result Value Ref Range   Total CK 173 38 - 234 U/L    Comment:  Performed at [facility], [address] [place], [place] [address]  Lipid panel     Status: Abnormal   Collection Time: 05/29/23  7:11 AM  Result Value Ref Range   Cholesterol 183 0 - 200 mg/dL  Triglycerides 87 <150 mg/dL   HDL 63 >91 mg/dL   Total CHOL/HDL Ratio 2.9 RATIO   VLDL 17 0 - 40 mg/dL   LDL Cholesterol 478 (H) 0 - 99 mg/dL    Comment:        Total Cholesterol/HDL:CHD Risk Coronary Heart Disease Risk Table                     Men   Women  1/2 Average Risk   3.4   3.3  Average Risk       5.0   4.4  2 X Average Risk   9.6   7.1  3 X Average Risk  23.4   11.0        Use the calculated Patient Ratio above and the CHD Risk Table to determine the patient's CHD Risk.        ATP III CLASSIFICATION (LDL):  <100     mg/dL   Optimal  295-621  mg/dL   Near or Above                    Optimal  130-159  mg/dL   Borderline  308-657  mg/dL   High  >846     mg/dL   Very High Performed at [facility], [address] [place], [place] [address]   Brain natriuretic peptide     Status: None   Collection Time: 05/29/23 12:43 PM  Result Value Ref Range   B Natriuretic Peptide 15.8 0.0 - 100.0 pg/mL    Comment: Performed at [facility], [address] [place], [place] [address]  Basic metabolic panel     Status: Abnormal   Collection Time: 05/29/23 12:43 PM  Result Value Ref Range   Sodium 138 135 - 145 mmol/L   Potassium 3.9 3.5 - 5.1 mmol/L   Chloride 106 98 - 111 mmol/L   CO2 25 22 - 32 mmol/L   Glucose, Bld 101 (H) 70 - 99 mg/dL    Comment: Glucose reference range applies only to samples taken after fasting for at least 8 hours.   BUN 20 6 - 20 mg/dL   Creatinine, Ser 2.44 (H) 0.44 - 1.00 mg/dL   Calcium 9.1 8.9 - 01.0 mg/dL   GFR, Estimated >27 >25 mL/min    Comment: (NOTE) Calculated using the CKD-EPI Creatinine Equation (2021)    Anion gap 7 5 - 15    Comment: Performed at [facility], [address] [place], [place]  [address]    Blood Alcohol level:  Lab Results  Component Value Date   [facility] <10 05/22/2023   ETH <10 12/22/2022    Metabolic Disorder Labs: Lab Results  Component Value Date   HGBA1C 5.2 05/22/2023   MPG 102.54 05/22/2023   No results found for: "PROLACTIN" Lab Results  Component Value Date   CHOL 183 05/29/2023   TRIG 87 05/29/2023   HDL 63 05/29/2023   CHOLHDL 2.9 05/29/2023   VLDL 17 05/29/2023   LDLCALC 103 (H) 05/29/2023   LDLCALC 106 (H) 12/13/2021    Physical Findings: AIMS:  , ,  ,  ,    CIWA:    COWS:      Psychiatric Specialty Exam:  Presentation  General Appearance:  Appropriate for Environment; Casual  Eye Contact: Fair  Speech: Clear and Coherent  Speech Volume: Normal    Mood and Affect  Mood: Anxious; Depressed  Affect: Depressed; Flat   Thought  Process  Thought Processes: Coherent  Descriptions of Associations:Intact  Orientation:Full (Time, Place and Person)  Thought Content:Logical  Hallucinations:Hallucinations: None  Ideas of Reference:None  Suicidal Thoughts:Suicidal Thoughts: No  Homicidal Thoughts:Homicidal Thoughts: No   Sensorium  Memory: Immediate Fair; Recent Fair; Remote Fair  Judgment: Impaired  Insight: Shallow   Executive Functions  Concentration: Fair  Attention Span: Fair  Recall: Fair  Fund of Knowledge: Fair  Language: Fair   Psychomotor Activity  Psychomotor Activity: Psychomotor Activity: Normal  Musculoskeletal: Strength & Muscle Tone: within normal limits Gait & Station: normal Assets  Assets: Manufacturing systems engineer; Financial Resources/Insurance; Resilience    Physical Exam: Physical Exam Vitals and nursing note reviewed.  HENT:     Head: Normocephalic.     Right Ear: Tympanic membrane normal.     Nose: Nose normal.     Mouth/Throat:     Mouth: Mucous membranes are moist.  Cardiovascular:     Rate and Rhythm: Normal rate.     Pulses: Normal pulses.   Pulmonary:     Breath sounds: Normal breath sounds.  Abdominal:     General: Bowel sounds are normal.  Skin:    General: Skin is warm.  Neurological:     General: No focal deficit present.     Mental Status: She is alert.    ROS Blood pressure 121/67, pulse 81, temperature 98.6 F (37 C), resp. rate 18, height 5\' 4"  (1.626 m), weight 82.8 kg, SpO2 99%. Body mass index is 31.33 kg/m.  Diagnosis: Principal Problem:   MDD (major depressive disorder), recurrent episode, severe (HCC) Active Problems:   Essential hypertension   MDD (major depressive disorder), recurrent severe, without psychosis (HCC)   Ingestion of substance, intentional self-harm, sequela (HCC)   Cocaine abuse (HCC)   Pain and swelling of lower extremity  Clinical Decision Making: Patient was recently medically admitted after being found unresponsive in her house with possible overdose on her medications of cocaine.  Patient displays symptoms of depression, anhedonia, hopelessness in the context of multiple psychosocial stressors including death of her mom, homelessness, losing job etc. patient needs inpatient hospitalization for further stabilization.   Treatment Plan Summary:   Safety and Monitoring:             -- Involuntary admission to inpatient psychiatric unit for safety, stabilization and treatment             -- Daily contact with patient to assess and evaluate symptoms and progress in treatment             -- Patient's case to be discussed in multi-disciplinary team meeting             -- Observation Level: q15 minute checks             -- Vital signs:  q12 hours             -- Precautions: suicide, elopement, and assault   2. Psychiatric Diagnoses and Treatment:               In the ED got restarted on her Prozac 20 mg daily, Remeron 30 mg nightly Risperdal 0.5 mg daily, added trazodone 50 mg nightly for insomnia   -- The risks/benefits/side-effects/alternatives to this medication were discussed in  detail with the patient and time was given for questions. The patient consents to medication trial.                -- Metabolic profile and EKG monitoring obtained while  on an atypical antipsychotic (BMI: Lipid Panel: HbgA1c: QTc:)              -- Encouraged patient to participate in unit milieu and in scheduled group therapies                            3. Medical Issues Being Addressed:  Bilateral pitting edema of lower extremities-hospitalist consult made and EKG stat ordered   4. Discharge Planning:              -- Social work and case management to assist with discharge planning and identification of hospital follow-up needs prior to discharge             -- Estimated LOS: 5-7 days             -- Discharge Concerns: Need to establish a safety plan; Medication compliance and effectiveness             -- Discharge Goals: Return home with outpatient referrals follow ups   Physician Treatment Plan for Primary Diagnosis: MDD (major depressive disorder), recurrent episode, severe (HCC) Long Term Goal(s): Improvement in symptoms so as ready for discharge   Short Term Goals: Ability to identify changes in lifestyle to reduce recurrence of condition will improve, Ability to verbalize feelings will improve, Ability to disclose and discuss suicidal ideas, Ability to demonstrate self-control will improve, and Ability to identify and develop effective coping behaviors will improve   Physician Treatment Plan for Secondary Diagnosis: Principal Problem:   MDD (major depressive disorder), recurrent episode, severe (HCC)   Long Term Goal(s): Improvement in symptoms so as ready for discharge   Short Term Goals: Ability to identify changes in lifestyle to reduce recurrence of condition will improve, Ability to verbalize feelings will improve, Ability to disclose and discuss suicidal ideas, Ability to demonstrate self-control will improve, Ability to identify and develop effective coping behaviors will  improve, Ability to maintain clinical measurements within normal limits will improve, Compliance with prescribed medications will improve, and Ability to identify triggers associated with substance abuse/mental health issues will improve   Verner Chol, MD 05/29/2023, 4:46 PM

## 2023-05-29 NOTE — Group Note (Signed)
 Date:  05/29/2023 Time:  10:15 PM  Group Topic/Focus:  Goals Group:   The focus of this group is to help patients establish daily goals to achieve during treatment and discuss how the patient can incorporate goal setting into their daily lives to aide in recovery.    Participation Level:  Active  Participation Quality:  Appropriate and Attentive  Affect:  Appropriate  Cognitive:  Alert and Appropriate  Insight: Appropriate and Good  Engagement in Group:  Engaged and Improving  Modes of Intervention:  Discussion, Rapport Building, Socialization, and Support  Additional Comments:     Genee Rann 05/29/2023, 10:15 PM
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
## 2023-05-29 NOTE — Group Note (Signed)
 Date:  05/29/2023 Time:  10:54 AM  Group Topic/Focus:  Overcoming Stress:   The focus of this group is to define stress and help patients assess their triggers.    Participation Level:  Active  Participation Quality:  Appropriate  Affect:  Appropriate  Cognitive:  Appropriate  Insight: Appropriate  Engagement in Group:  Engaged  Modes of Intervention:  Activity   Ardelle Anton 05/29/2023, 10:54 AM

## 2023-05-30 DIAGNOSIS — F332 Major depressive disorder, recurrent severe without psychotic features: Secondary | ICD-10-CM | POA: Diagnosis not present

## 2023-05-30 LAB — BASIC METABOLIC PANEL
Anion gap: 5 (ref 5–15)
BUN: 21 mg/dL — ABNORMAL HIGH (ref 6–20)
CO2: 26 mmol/L (ref 22–32)
Calcium: 8.6 mg/dL — ABNORMAL LOW (ref 8.9–10.3)
Chloride: 106 mmol/L (ref 98–111)
Creatinine, Ser: 1.07 mg/dL — ABNORMAL HIGH (ref 0.44–1.00)
GFR, Estimated: >60 mL/min (ref >60)
Glucose, Bld: 100 mg/dL — ABNORMAL HIGH (ref 70–99)
Potassium: 3.8 mmol/L (ref 3.5–5.1)
Sodium: 137 mmol/L (ref 135–145)

## 2023-05-30 LAB — MAGNESIUM: Magnesium: 1.7 mg/dL (ref 1.7–2.4)

## 2023-05-30 LAB — PHOSPHORUS: Phosphorus: 3.4 mg/dL (ref 2.5–4.6)

## 2023-05-30 NOTE — Progress Notes (Signed)
 Patient is pleasant and cooperative.  Animated affect.  Denies SI/HI and AVH.  Denies anxiety and depression.  Reports she slept well.  Pain rated 6/10 in bilateral feet.    Compliant with scheduled medications.  15 min checks in place for safety.  Patient is present in the milieu.  Appropriate interaction with peers.

## 2023-05-30 NOTE — Plan of Care (Signed)
   Problem: Education: Goal: Knowledge of the prescribed therapeutic regimen will improve Outcome: Progressing   Problem: Activity: Goal: Interest or engagement in leisure activities will improve Outcome: Progressing   Problem: Coping: Goal: Coping ability will improve Outcome: Progressing

## 2023-05-30 NOTE — Progress Notes (Signed)
 [facility] MD Progress Note  05/30/2023 11:45 AM Natalie Edwards  MRN:  [id number]  Natalie Edwards is a 58 y.o. female admitted: Presented to the [facility] 05/22/2023  6:47 PM for cocaine overdose. She carries the psychiatric diagnoses of Major depressive disorder, recurrent without psychotic features, Cocaine abuse and previous suicide attempt, and has a past medical history of HTN, GERD. Her current presentation of recent cocaine overdose with past history of MDD and suicide attempts is most consistent with major depressive disorder, recurrent vs cocaine induced mood disorder. She meets criteria for inpatient psychiatric admission based on recent cocaine overdose and risk of harm to self.  Patient is admitted to the geropsychiatry unit for further stabilization   Subjective:  Chart reviewed, case discussed in multidisciplinary meeting, patient seen during rounds.  On interview patient offer no complaints.  She reports doing well.  She reports that she thought she is going home today.  Provider clarified that the discharge planning did not happen for her as she has no place to go and also her treatment goal with the social worker was Child psychotherapist to help her with some rehabs.  Patient denies SI/HI/plan.  She minimizes her mental health and seems to be confused about the help she needs and is more focused on discharge.  She denies auditory/visual hallucinations.  She is taking her medications with no problems.  Her leg swelling is coming down and she denies having any pain in her legs today.   Sleep: Fair  Appetite:  Fair  Past Psychiatric History: see h&P Family History:  Family History  Problem Relation Age of Onset   Cancer Maternal Uncle        Lung   Cancer Maternal Uncle        Lung   Cancer Maternal Uncle        Lung   Headache Neg Hx        "I don't think so"   Migraines Neg Hx        "I don't think so"   Social History:  Social History   Substance and Sexual Activity  Alcohol Use Yes    Comment: occasionally - last drink was July 4, 1 shot     Social History   Substance and Sexual Activity  Drug Use No    Social History   Socioeconomic History   Marital status: Single    Spouse name: Not on file   Number of children: 3   Years of education: Not on file   Highest education level: 12th grade  Occupational History   Occupation: environmental services at [employer]  Tobacco Use   Smoking status: Every Day    Current packs/day: 0.50    Average packs/day: 0.5 packs/day for 25.0 years (12.5 ttl pk-yrs)    Types: Cigarettes   Smokeless tobacco: Never   Tobacco comments:    Pt tried to quit - helps with anxiety     1 pack last patient 3 days   Vaping Use   Vaping status: Never Used  Substance and Sexual Activity   Alcohol use: Yes    Comment: occasionally - last drink was July 4, 1 shot   Drug use: No   Sexual activity: Yes    Comment: hysterectomy  Other Topics Concern   Not on file  Social History Narrative   Lives at home in an apartment. Her mother lives with her.    Right handed   Caffeine: drinks approx. 36 oz of pepsi per day. Sometimes  drinks 2 cups of coffee in a day as well.    Social Drivers of Corporate investment banker Strain: Low Risk  (03/06/2023)   Overall Financial Resource Strain (CARDIA)    Difficulty of Paying Living Expenses: Not very hard  Food Insecurity: Patient Declined (05/26/2023)   Hunger Vital Sign    Worried About Running Out of Food in the Last Year: Patient declined    Ran Out of Food in the Last Year: Patient declined  Recent Concern: Food Insecurity - Food Insecurity Present (05/24/2023)   Hunger Vital Sign    Worried About Running Out of Food in the Last Year: Sometimes true    Ran Out of Food in the Last Year: Often true  Transportation Needs: No Transportation Needs (05/26/2023)   PRAPARE - Administrator, Civil Service (Medical): No    Lack of Transportation (Non-Medical): No  Recent Concern:  Transportation Needs - Unmet Transportation Needs (05/24/2023)   PRAPARE - Administrator, Civil Service (Medical): Yes    Lack of Transportation (Non-Medical): No  Physical Activity: Sufficiently Active (05/22/2022)   Exercise Vital Sign    Days of Exercise per Week: 4 days    Minutes of Exercise per Session: 70 min  Stress: Stress Concern Present (03/06/2023)   [employer] of Occupational Health - Occupational Stress Questionnaire    Feeling of Stress : Very much  Social Connections: Socially Isolated (05/26/2023)   Social Connection and Isolation Panel [NHANES]    Frequency of Communication with Friends and Family: More than three times a week    Frequency of Social Gatherings with Friends and Family: More than three times a week    Attends Religious Services: Never    Database administrator or Organizations: No    Attends Engineer, structural: Never    Marital Status: Divorced   Past Medical History:  Past Medical History:  Diagnosis Date   Allergy    Anemia    Diverticulosis    Gall bladder stones 1993   Gastric ulcer    GERD (gastroesophageal reflux disease)    Hypertension    Renal mass     Past Surgical History:  Procedure Laterality Date   ABDOMINAL HYSTERECTOMY     BIOPSY  09/29/2022   Procedure: BIOPSY;  Surgeon: Napoleon Form, MD;  Location: [facility];  Service: Gastroenterology;;   CHOLECYSTECTOMY     COLONOSCOPY WITH ESOPHAGOGASTRODUODENOSCOPY (EGD)  04/07/2023   [facility]   ESOPHAGOGASTRODUODENOSCOPY (EGD) WITH PROPOFOL N/A 09/29/2022   Procedure: ESOPHAGOGASTRODUODENOSCOPY (EGD) WITH PROPOFOL;  Surgeon: Napoleon Form, MD;  Location: [facility];  Service: Gastroenterology;  Laterality: N/A;    Current Medications: Current Facility-Administered Medications  Medication Dose Route Frequency Provider Last Rate Last Admin   acetaminophen (TYLENOL) tablet 650 mg  650 mg Oral Q6H PRN Maryagnes Amos, FNP   650 mg  at 05/28/23 1612   albuterol (PROVENTIL) (2.5 MG/3ML) 0.083% nebulizer solution 2.5 mg  2.5 mg Nebulization Q2H PRN Starkes-Perry, Juel Burrow, FNP       alum & mag hydroxide-simeth (MAALOX/MYLANTA) 200-200-20 MG/5ML suspension 30 mL  30 mL Oral Q4H PRN Starkes-Perry, Juel Burrow, FNP       FLUoxetine (PROZAC) capsule 20 mg  20 mg Oral Daily Maryagnes Amos, FNP   20 mg at 05/30/23 1003   hydrochlorothiazide (HYDRODIURIL) tablet 25 mg  25 mg Oral Daily Delfino Lovett, MD   25 mg at 05/30/23 1003  magnesium hydroxide (MILK OF MAGNESIA) suspension 30 mL  30 mL Oral Daily PRN Starkes-Perry, Juel Burrow, FNP       mirtazapine (REMERON) tablet 30 mg  30 mg Oral QHS Maryagnes Amos, FNP   30 mg at 05/29/23 2126   naphazoline-glycerin (CLEAR EYES REDNESS) ophth solution 1-2 drop  1-2 drop Both Eyes QID PRN Starkes-Perry, Juel Burrow, FNP       nicotine (NICODERM CQ - dosed in mg/24 hours) patch 21 mg  21 mg Transdermal Daily Verner Chol, MD   21 mg at 05/30/23 1003   OLANZapine (ZYPREXA) injection 5 mg  5 mg Intramuscular TID PRN Starkes-Perry, Juel Burrow, FNP       OLANZapine zydis (ZYPREXA) disintegrating tablet 5 mg  5 mg Oral TID PRN Rosario Adie, Juel Burrow, FNP       ondansetron (ZOFRAN) tablet 4 mg  4 mg Oral Q6H PRN Starkes-Perry, Juel Burrow, FNP       Or   ondansetron (ZOFRAN) injection 4 mg  4 mg Intravenous Q6H PRN Starkes-Perry, Juel Burrow, FNP       pantoprazole (PROTONIX) EC tablet 40 mg  40 mg Oral Daily Maryagnes Amos, FNP   40 mg at 05/30/23 1003   risperiDONE (RISPERDAL) tablet 0.5 mg  0.5 mg Oral Daily Maryagnes Amos, FNP   0.5 mg at 05/30/23 1003   traMADol (ULTRAM) tablet 50 mg  50 mg Oral Q12H PRN Maryagnes Amos, FNP   50 mg at 05/29/23 2134   traZODone (DESYREL) tablet 50 mg  50 mg Oral QHS Verner Chol, MD   50 mg at 05/29/23 2126    Lab Results:  Results for orders placed or performed during the hospital encounter of 05/26/23 (from the past 48 hours)  Lipid  panel     Status: Abnormal   Collection Time: 05/29/23  7:11 AM  Result Value Ref Range   Cholesterol 183 0 - 200 mg/dL   Triglycerides 87 <409 mg/dL   HDL 63 >81 mg/dL   Total CHOL/HDL Ratio 2.9 RATIO   VLDL 17 0 - 40 mg/dL   LDL Cholesterol 191 (H) 0 - 99 mg/dL    Comment:        Total Cholesterol/HDL:CHD Risk Coronary Heart Disease Risk Table                     Men   Women  1/2 Average Risk   3.4   3.3  Average Risk       5.0   4.4  2 X Average Risk   9.6   7.1  3 X Average Risk  23.4   11.0        Use the calculated Patient Ratio above and the CHD Risk Table to determine the patient's CHD Risk.        ATP III CLASSIFICATION (LDL):  <100     mg/dL   Optimal  478-295  mg/dL   Near or Above                    Optimal  130-159  mg/dL   Borderline  621-308  mg/dL   High  >657     mg/dL   Very High Performed at [facility], [address] [place], [place] [address]   Brain natriuretic peptide     Status: None   Collection Time: 05/29/23 12:43 PM  Result Value Ref Range   B Natriuretic Peptide 15.8 0.0 -  100.0 pg/mL    Comment: Performed at [facility], [address] [place], [place] [address]  Basic metabolic panel     Status: Abnormal   Collection Time: 05/29/23 12:43 PM  Result Value Ref Range   Sodium 138 135 - 145 mmol/L   Potassium 3.9 3.5 - 5.1 mmol/L   Chloride 106 98 - 111 mmol/L   CO2 25 22 - 32 mmol/L   Glucose, Bld 101 (H) 70 - 99 mg/dL    Comment: Glucose reference range applies only to samples taken after fasting for at least 8 hours.   BUN 20 6 - 20 mg/dL   Creatinine, Ser 8.11 (H) 0.44 - 1.00 mg/dL   Calcium 9.1 8.9 - 91.4 mg/dL   GFR, Estimated >78 >29 mL/min    Comment: (NOTE) Calculated using the CKD-EPI Creatinine Equation (2021)    Anion gap 7 5 - 15    Comment: Performed at [facility], [address] [place], [place] [address]  Basic metabolic panel     Status: Abnormal   Collection Time: 05/30/23  6:24  AM  Result Value Ref Range   Sodium 137 135 - 145 mmol/L   Potassium 3.8 3.5 - 5.1 mmol/L   Chloride 106 98 - 111 mmol/L   CO2 26 22 - 32 mmol/L   Glucose, Bld 100 (H) 70 - 99 mg/dL    Comment: Glucose reference range applies only to samples taken after fasting for at least 8 hours.   BUN 21 (H) 6 - 20 mg/dL   Creatinine, Ser 0.86 (H) 0.44 - 1.00 mg/dL   Calcium 8.6 (L) 8.9 - 10.3 mg/dL   GFR, Estimated >57 >84 mL/min    Comment: (NOTE) Calculated using the CKD-EPI Creatinine Equation (2021)    Anion gap 5 5 - 15    Comment: Performed at [facility], [address] [place], [place] [address]  Phosphorus     Status: None   Collection Time: 05/30/23  6:24 AM  Result Value Ref Range   Phosphorus 3.4 2.5 - 4.6 mg/dL    Comment: Performed at [facility], [address] [place], [place] [address]  Magnesium     Status: None   Collection Time: 05/30/23  6:24 AM  Result Value Ref Range   Magnesium 1.7 1.7 - 2.4 mg/dL    Comment: Performed at [facility], [address] [place], [place] [address]    Blood Alcohol level:  Lab Results  Component Value Date   [facility] <10 05/22/2023   ETH <10 12/22/2022    Metabolic Disorder Labs: Lab Results  Component Value Date   HGBA1C 5.2 05/22/2023   MPG 102.54 05/22/2023   No results found for: "PROLACTIN" Lab Results  Component Value Date   CHOL 183 05/29/2023   TRIG 87 05/29/2023   HDL 63 05/29/2023   CHOLHDL 2.9 05/29/2023   VLDL 17 05/29/2023   LDLCALC 103 (H) 05/29/2023   LDLCALC 106 (H) 12/13/2021    Physical Findings: AIMS:  , ,  ,  ,    CIWA:    COWS:      Psychiatric Specialty Exam:  Presentation  General Appearance:  Appropriate for Environment; Casual  Eye Contact: Fair  Speech: Clear and Coherent  Speech Volume: Normal    Mood and Affect  Mood: Depressed  Affect: Depressed   Thought Process  Thought Processes: Coherent  Descriptions of  Associations:Intact  Orientation:Full (Time, Place and Person)  Thought Content:Logical  Hallucinations:Hallucinations: None  Ideas of Reference:None  Suicidal Thoughts:Suicidal Thoughts: No  Homicidal Thoughts:Homicidal Thoughts: No   Sensorium  Memory: Immediate Fair; Recent Fair; Remote Fair  Judgment: Impaired  Insight: Shallow   Executive Functions  Concentration: Fair  Attention Span: Fair  Recall: Fair  Fund of Knowledge: Fair  Language: Fair   Psychomotor Activity  Psychomotor Activity: Psychomotor Activity: Normal  Musculoskeletal: Strength & Muscle Tone: within normal limits Gait & Station: normal Assets  Assets: Manufacturing systems engineer; Desire for Improvement; Physical Health; Resilience    Physical Exam: Physical Exam Vitals and nursing note reviewed.  HENT:     Head: Normocephalic.     Right Ear: Tympanic membrane normal.     Nose: Nose normal.     Mouth/Throat:     Mouth: Mucous membranes are moist.  Cardiovascular:     Rate and Rhythm: Normal rate.     Pulses: Normal pulses.  Pulmonary:     Breath sounds: Normal breath sounds.  Abdominal:     General: Bowel sounds are normal.  Skin:    General: Skin is warm.  Neurological:     General: No focal deficit present.     Mental Status: She is alert.    ROS Blood pressure (!) 115/50, pulse 81, temperature (!) 97.5 F (36.4 C), resp. rate 18, height 5\' 4"  (1.626 m), weight 82.8 kg, SpO2 96%. Body mass index is 31.33 kg/m.  Diagnosis: Principal Problem:   MDD (major depressive disorder), recurrent episode, severe (HCC) Active Problems:   Essential hypertension   MDD (major depressive disorder), recurrent severe, without psychosis (HCC)   Ingestion of substance, intentional self-harm, sequela (HCC)   Cocaine abuse (HCC)   Pain and swelling of lower extremity  Clinical Decision Making: Patient was recently medically admitted after being found unresponsive in her house with  possible overdose on her medications of cocaine.  Patient displays symptoms of depression, anhedonia, hopelessness in the context of multiple psychosocial stressors including death of her mom, homelessness, losing job etc. patient needs inpatient hospitalization for further stabilization.   Treatment Plan Summary:   Safety and Monitoring:             -- Involuntary admission to inpatient psychiatric unit for safety, stabilization and treatment             -- Daily contact with patient to assess and evaluate symptoms and progress in treatment             -- Patient's case to be discussed in multi-disciplinary team meeting             -- Observation Level: q15 minute checks             -- Vital signs:  q12 hours             -- Precautions: suicide, elopement, and assault   2. Psychiatric Diagnoses and Treatment:               In the ED got restarted on her Prozac 20 mg daily, Remeron 30 mg nightly Risperdal 0.5 mg daily, added trazodone 50 mg nightly for insomnia   -- The risks/benefits/side-effects/alternatives to this medication were discussed in detail with the patient and time was given for questions. The patient consents to medication trial.                -- Metabolic profile and EKG monitoring obtained while on an atypical antipsychotic (BMI: Lipid Panel: HbgA1c: QTc:)              --  Encouraged patient to participate in unit milieu and in scheduled group therapies                            3. Medical Issues Being Addressed:  Bilateral pitting edema of lower extremities-hospitalist consult made and EKG stat ordered   4. Discharge Planning:              -- Social work and case management to assist with discharge planning and identification of hospital follow-up needs prior to discharge             -- Estimated LOS: 5-7 days             -- Discharge Concerns: Need to establish a safety plan; Medication compliance and effectiveness             -- Discharge Goals: Return home with  outpatient referrals follow ups   Physician Treatment Plan for Primary Diagnosis: MDD (major depressive disorder), recurrent episode, severe (HCC) Long Term Goal(s): Improvement in symptoms so as ready for discharge   Short Term Goals: Ability to identify changes in lifestyle to reduce recurrence of condition will improve, Ability to verbalize feelings will improve, Ability to disclose and discuss suicidal ideas, Ability to demonstrate self-control will improve, and Ability to identify and develop effective coping behaviors will improve   Physician Treatment Plan for Secondary Diagnosis: Principal Problem:   MDD (major depressive disorder), recurrent episode, severe (HCC)   Long Term Goal(s): Improvement in symptoms so as ready for discharge   Short Term Goals: Ability to identify changes in lifestyle to reduce recurrence of condition will improve, Ability to verbalize feelings will improve, Ability to disclose and discuss suicidal ideas, Ability to demonstrate self-control will improve, Ability to identify and develop effective coping behaviors will improve, Ability to maintain clinical measurements within normal limits will improve, Compliance with prescribed medications will improve, and Ability to identify triggers associated with substance abuse/mental health issues will improve   Verner Chol, MD 05/30/2023, 11:45 AM

## 2023-05-31 DIAGNOSIS — F332 Major depressive disorder, recurrent severe without psychotic features: Secondary | ICD-10-CM | POA: Diagnosis not present

## 2023-05-31 MED ORDER — TRAMADOL HCL 50 MG PO TABS
50.0000 mg | ORAL_TABLET | Freq: Three times a day (TID) | ORAL | Status: DC | PRN
  Administered 2023-05-31 – 2023-06-10 (×19): 50 mg via ORAL
  Filled 2023-05-31 (×20): qty 1

## 2023-05-31 NOTE — Progress Notes (Signed)
 Patient is pleasant and cooperative.  Denies SI/HI and AVH.  Denies anxiety and depression.  Pain rated 10/10 in feet.  Reports she slept well.    Compliant with scheduled medications.  15 min checks in place for safety.  Patient is present in the milieu.  Appropriate interaction with peers and staff.    PRN pain medications given x 3

## 2023-05-31 NOTE — Plan of Care (Signed)
   Problem: Education: Goal: Utilization of techniques to improve thought processes will improve Outcome: Progressing Goal: Knowledge of the prescribed therapeutic regimen will improve Outcome: Progressing

## 2023-05-31 NOTE — Progress Notes (Signed)
 [facility] MD Progress Note  05/31/2023 11:50 AM Natalie Edwards  MRN:  [id number]  Natalie Edwards is a 58 y.o. female admitted: Presented to the [facility] 05/22/2023  6:47 PM for cocaine overdose. She carries the psychiatric diagnoses of Major depressive disorder, recurrent without psychotic features, Cocaine abuse and previous suicide attempt, and has a past medical history of HTN, GERD. Her current presentation of recent cocaine overdose with past history of MDD and suicide attempts is most consistent with major depressive disorder, recurrent vs cocaine induced mood disorder. She meets criteria for inpatient psychiatric admission based on recent cocaine overdose and risk of harm to self.  Patient is admitted to the geropsychiatry unit for further stabilization   Subjective:  Chart reviewed, case discussed in multidisciplinary meeting, patient seen during rounds.  .  Patient is noted to be resting in her room.  She reports that the pain in her lower extremities is still there but denies any worsening edema.  She was advised to keep her feet elevated as she was resting in bed with head elevation.  She denies SI/HI/intent/plan.  She reports that her plan of going and staying with her friend did not work out and she was upset about that.  She did acknowledge the need for her to go to rehab and is requesting social work team to help her with finding her rehab.  She denies any active SI/HI/intent/plan.  She denies auditory/visual hallucinations.   Sleep: Fair  Appetite:  Fair  Past Psychiatric History: see h&P Family History:  Family History  Problem Relation Age of Onset   Cancer Maternal Uncle        Lung   Cancer Maternal Uncle        Lung   Cancer Maternal Uncle        Lung   Headache Neg Hx        "I don't think so"   Migraines Neg Hx        "I don't think so"   Social History:  Social History   Substance and Sexual Activity  Alcohol Use Yes   Comment: occasionally - last drink was July 4, 1 shot      Social History   Substance and Sexual Activity  Drug Use No    Social History   Socioeconomic History   Marital status: Single    Spouse name: Not on file   Number of children: 3   Years of education: Not on file   Highest education level: 12th grade  Occupational History   Occupation: environmental services at [employer]  Tobacco Use   Smoking status: Every Day    Current packs/day: 0.50    Average packs/day: 0.5 packs/day for 25.0 years (12.5 ttl pk-yrs)    Types: Cigarettes   Smokeless tobacco: Never   Tobacco comments:    Pt tried to quit - helps with anxiety     1 pack last patient 3 days   Vaping Use   Vaping status: Never Used  Substance and Sexual Activity   Alcohol use: Yes    Comment: occasionally - last drink was July 4, 1 shot   Drug use: No   Sexual activity: Yes    Comment: hysterectomy  Other Topics Concern   Not on file  Social History Narrative   Lives at home in an apartment. Her mother lives with her.    Right handed   Caffeine: drinks approx. 36 oz of pepsi per day. Sometimes drinks 2 cups of coffee in  a day as well.    Social Drivers of Corporate investment banker Strain: Low Risk  (03/06/2023)   Overall Financial Resource Strain (CARDIA)    Difficulty of Paying Living Expenses: Not very hard  Food Insecurity: Patient Declined (05/26/2023)   Hunger Vital Sign    Worried About Running Out of Food in the Last Year: Patient declined    Ran Out of Food in the Last Year: Patient declined  Recent Concern: Food Insecurity - Food Insecurity Present (05/24/2023)   Hunger Vital Sign    Worried About Running Out of Food in the Last Year: Sometimes true    Ran Out of Food in the Last Year: Often true  Transportation Needs: No Transportation Needs (05/26/2023)   PRAPARE - Administrator, Civil Service (Medical): No    Lack of Transportation (Non-Medical): No  Recent Concern: Transportation Needs - Unmet Transportation Needs (05/24/2023)    PRAPARE - Administrator, Civil Service (Medical): Yes    Lack of Transportation (Non-Medical): No  Physical Activity: Sufficiently Active (05/22/2022)   Exercise Vital Sign    Days of Exercise per Week: 4 days    Minutes of Exercise per Session: 70 min  Stress: Stress Concern Present (03/06/2023)   [employer] of Occupational Health - Occupational Stress Questionnaire    Feeling of Stress : Very much  Social Connections: Socially Isolated (05/26/2023)   Social Connection and Isolation Panel [NHANES]    Frequency of Communication with Friends and Family: More than three times a week    Frequency of Social Gatherings with Friends and Family: More than three times a week    Attends Religious Services: Never    Database administrator or Organizations: No    Attends Engineer, structural: Never    Marital Status: Divorced   Past Medical History:  Past Medical History:  Diagnosis Date   Allergy    Anemia    Diverticulosis    Gall bladder stones 1993   Gastric ulcer    GERD (gastroesophageal reflux disease)    Hypertension    Renal mass     Past Surgical History:  Procedure Laterality Date   ABDOMINAL HYSTERECTOMY     BIOPSY  09/29/2022   Procedure: BIOPSY;  Surgeon: Napoleon Form, MD;  Location: [facility];  Service: Gastroenterology;;   CHOLECYSTECTOMY     COLONOSCOPY WITH ESOPHAGOGASTRODUODENOSCOPY (EGD)  04/07/2023   [facility]   ESOPHAGOGASTRODUODENOSCOPY (EGD) WITH PROPOFOL N/A 09/29/2022   Procedure: ESOPHAGOGASTRODUODENOSCOPY (EGD) WITH PROPOFOL;  Surgeon: Napoleon Form, MD;  Location: [facility];  Service: Gastroenterology;  Laterality: N/A;    Current Medications: Current Facility-Administered Medications  Medication Dose Route Frequency Provider Last Rate Last Admin   acetaminophen (TYLENOL) tablet 650 mg  650 mg Oral Q6H PRN Maryagnes Amos, FNP   650 mg at 05/28/23 1612   albuterol (PROVENTIL) (2.5 MG/3ML) 0.083%  nebulizer solution 2.5 mg  2.5 mg Nebulization Q2H PRN Starkes-Perry, Juel Burrow, FNP       alum & mag hydroxide-simeth (MAALOX/MYLANTA) 200-200-20 MG/5ML suspension 30 mL  30 mL Oral Q4H PRN Starkes-Perry, Juel Burrow, FNP       FLUoxetine (PROZAC) capsule 20 mg  20 mg Oral Daily Maryagnes Amos, FNP   20 mg at 05/31/23 0859   hydrochlorothiazide (HYDRODIURIL) tablet 25 mg  25 mg Oral Daily Delfino Lovett, MD   25 mg at 05/31/23 0859   magnesium hydroxide (MILK OF MAGNESIA)  suspension 30 mL  30 mL Oral Daily PRN Maryagnes Amos, FNP       mirtazapine (REMERON) tablet 30 mg  30 mg Oral QHS Maryagnes Amos, FNP   30 mg at 05/30/23 2105   naphazoline-glycerin (CLEAR EYES REDNESS) ophth solution 1-2 drop  1-2 drop Both Eyes QID PRN Starkes-Perry, Juel Burrow, FNP       nicotine (NICODERM CQ - dosed in mg/24 hours) patch 21 mg  21 mg Transdermal Daily Verner Chol, MD   21 mg at 05/31/23 0858   OLANZapine (ZYPREXA) injection 5 mg  5 mg Intramuscular TID PRN Starkes-Perry, Juel Burrow, FNP       OLANZapine zydis (ZYPREXA) disintegrating tablet 5 mg  5 mg Oral TID PRN Rosario Adie, Juel Burrow, FNP       ondansetron (ZOFRAN) tablet 4 mg  4 mg Oral Q6H PRN Starkes-Perry, Juel Burrow, FNP       Or   ondansetron (ZOFRAN) injection 4 mg  4 mg Intravenous Q6H PRN Starkes-Perry, Juel Burrow, FNP       pantoprazole (PROTONIX) EC tablet 40 mg  40 mg Oral Daily Maryagnes Amos, FNP   40 mg at 05/31/23 0859   risperiDONE (RISPERDAL) tablet 0.5 mg  0.5 mg Oral Daily Maryagnes Amos, FNP   0.5 mg at 05/31/23 0859   traMADol (ULTRAM) tablet 50 mg  50 mg Oral Q12H PRN Maryagnes Amos, FNP   50 mg at 05/31/23 0913   traZODone (DESYREL) tablet 50 mg  50 mg Oral QHS Verner Chol, MD   50 mg at 05/30/23 2105    Lab Results:  Results for orders placed or performed during the hospital encounter of 05/26/23 (from the past 48 hours)  Brain natriuretic peptide     Status: None   Collection Time: 05/29/23  12:43 PM  Result Value Ref Range   B Natriuretic Peptide 15.8 0.0 - 100.0 pg/mL    Comment: Performed at [facility], [address] [place], [place] [address]  Basic metabolic panel     Status: Abnormal   Collection Time: 05/29/23 12:43 PM  Result Value Ref Range   Sodium 138 135 - 145 mmol/L   Potassium 3.9 3.5 - 5.1 mmol/L   Chloride 106 98 - 111 mmol/L   CO2 25 22 - 32 mmol/L   Glucose, Bld 101 (H) 70 - 99 mg/dL    Comment: Glucose reference range applies only to samples taken after fasting for at least 8 hours.   BUN 20 6 - 20 mg/dL   Creatinine, Ser 6.04 (H) 0.44 - 1.00 mg/dL   Calcium 9.1 8.9 - 54.0 mg/dL   GFR, Estimated >98 >11 mL/min    Comment: (NOTE) Calculated using the CKD-EPI Creatinine Equation (2021)    Anion gap 7 5 - 15    Comment: Performed at [facility], [address] [place], [place] [address]  Basic metabolic panel     Status: Abnormal   Collection Time: 05/30/23  6:24 AM  Result Value Ref Range   Sodium 137 135 - 145 mmol/L   Potassium 3.8 3.5 - 5.1 mmol/L   Chloride 106 98 - 111 mmol/L   CO2 26 22 - 32 mmol/L   Glucose, Bld 100 (H) 70 - 99 mg/dL    Comment: Glucose reference range applies only to samples taken after fasting for at least 8 hours.   BUN 21 (H) 6 - 20 mg/dL   Creatinine, Ser 2.95 (H) 0.44 -  1.00 mg/dL   Calcium 8.6 (L) 8.9 - 10.3 mg/dL   GFR, Estimated >95 >62 mL/min    Comment: (NOTE) Calculated using the CKD-EPI Creatinine Equation (2021)    Anion gap 5 5 - 15    Comment: Performed at [facility], [address] [place], [place] [address]  Phosphorus     Status: None   Collection Time: 05/30/23  6:24 AM  Result Value Ref Range   Phosphorus 3.4 2.5 - 4.6 mg/dL    Comment: Performed at [facility], [address] [place], [place] [address]  Magnesium     Status: None   Collection Time: 05/30/23  6:24 AM  Result Value Ref Range   Magnesium 1.7 1.7 - 2.4 mg/dL    Comment: Performed  at [facility], [address] [place], [place] [address]    Blood Alcohol level:  Lab Results  Component Value Date   [facility] <10 05/22/2023   ETH <10 12/22/2022    Metabolic Disorder Labs: Lab Results  Component Value Date   HGBA1C 5.2 05/22/2023   MPG 102.54 05/22/2023   No results found for: "PROLACTIN" Lab Results  Component Value Date   CHOL 183 05/29/2023   TRIG 87 05/29/2023   HDL 63 05/29/2023   CHOLHDL 2.9 05/29/2023   VLDL 17 05/29/2023   LDLCALC 103 (H) 05/29/2023   LDLCALC 106 (H) 12/13/2021    Physical Findings: AIMS:  , ,  ,  ,    CIWA:    COWS:      Psychiatric Specialty Exam:  Presentation  General Appearance:  Appropriate for Environment; Casual  Eye Contact: Fair  Speech: Clear and Coherent  Speech Volume: Normal    Mood and Affect  Mood: Depressed  Affect: Appropriate; Depressed   Thought Process  Thought Processes: Coherent  Descriptions of Associations:Intact  Orientation:Full (Time, Place and Person)  Thought Content:Logical  Hallucinations:Hallucinations: None  Ideas of Reference:None  Suicidal Thoughts:Suicidal Thoughts: No  Homicidal Thoughts:Homicidal Thoughts: No   Sensorium  Memory: Immediate Fair; Recent Fair; Remote Fair  Judgment: Impaired  Insight: Shallow   Executive Functions  Concentration: Fair  Attention Span: Fair  Recall: [employer] of Knowledge: Fair  Language: Fair   Psychomotor Activity  Psychomotor Activity: Psychomotor Activity: Normal  Musculoskeletal: Strength & Muscle Tone: within normal limits Gait & Station: normal Assets  Assets: Manufacturing systems engineer; Desire for Improvement; Physical Health    Physical Exam: Physical Exam Vitals and nursing note reviewed.  HENT:     Head: Normocephalic.     Right Ear: Tympanic membrane normal.     Nose: Nose normal.     Mouth/Throat:     Mouth: Mucous membranes are moist.  Cardiovascular:     Rate  and Rhythm: Normal rate.     Pulses: Normal pulses.  Pulmonary:     Breath sounds: Normal breath sounds.  Abdominal:     General: Bowel sounds are normal.  Skin:    General: Skin is warm.  Neurological:     General: No focal deficit present.     Mental Status: She is alert.    ROS Blood pressure 115/65, pulse 86, temperature 98.7 F (37.1 C), resp. rate 20, height 5\' 4"  (1.626 m), weight 82.8 kg, SpO2 98%. Body mass index is 31.33 kg/m.  Diagnosis: Principal Problem:   MDD (major depressive disorder), recurrent episode, severe (HCC) Active Problems:   Essential hypertension   MDD (major depressive disorder), recurrent severe, without psychosis (HCC)  Ingestion of substance, intentional self-harm, sequela (HCC)   Cocaine abuse (HCC)   Pain and swelling of lower extremity  Clinical Decision Making: Patient was recently medically admitted after being found unresponsive in her house with possible overdose on her medications of cocaine.  Patient displays symptoms of depression, anhedonia, hopelessness in the context of multiple psychosocial stressors including death of her mom, homelessness, losing job etc. patient needs inpatient hospitalization for further stabilization.   Treatment Plan Summary:   Safety and Monitoring:             -- Involuntary admission to inpatient psychiatric unit for safety, stabilization and treatment             -- Daily contact with patient to assess and evaluate symptoms and progress in treatment             -- Patient's case to be discussed in multi-disciplinary team meeting             -- Observation Level: q15 minute checks             -- Vital signs:  q12 hours             -- Precautions: suicide, elopement, and assault   2. Psychiatric Diagnoses and Treatment:               In the ED got restarted on her Prozac 20 mg daily, Remeron 30 mg nightly Risperdal 0.5 mg daily, added trazodone 50 mg nightly for insomnia   -- The  risks/benefits/side-effects/alternatives to this medication were discussed in detail with the patient and time was given for questions. The patient consents to medication trial.                -- Metabolic profile and EKG monitoring obtained while on an atypical antipsychotic (BMI: Lipid Panel: HbgA1c: QTc:)              -- Encouraged patient to participate in unit milieu and in scheduled group therapies                            3. Medical Issues Being Addressed:  Bilateral pitting edema of lower extremities-hospitalist consult made and EKG stat ordered   4. Discharge Planning:              -- Social work and case management to assist with discharge planning and identification of hospital follow-up needs prior to discharge             -- Estimated LOS: 5-7 days             -- Discharge Concerns: Need to establish a safety plan; Medication compliance and effectiveness             -- Discharge Goals: Return home with outpatient referrals follow ups   Physician Treatment Plan for Primary Diagnosis: MDD (major depressive disorder), recurrent episode, severe (HCC) Long Term Goal(s): Improvement in symptoms so as ready for discharge   Short Term Goals: Ability to identify changes in lifestyle to reduce recurrence of condition will improve, Ability to verbalize feelings will improve, Ability to disclose and discuss suicidal ideas, Ability to demonstrate self-control will improve, and Ability to identify and develop effective coping behaviors will improve   Physician Treatment Plan for Secondary Diagnosis: Principal Problem:   MDD (major depressive disorder), recurrent episode, severe (HCC)   Long Term Goal(s): Improvement in symptoms so as ready for discharge  Short Term Goals: Ability to identify changes in lifestyle to reduce recurrence of condition will improve, Ability to verbalize feelings will improve, Ability to disclose and discuss suicidal ideas, Ability to demonstrate self-control will  improve, Ability to identify and develop effective coping behaviors will improve, Ability to maintain clinical measurements within normal limits will improve, Compliance with prescribed medications will improve, and Ability to identify triggers associated with substance abuse/mental health issues will improve   Verner Chol, MD 05/31/2023, 11:51 AM

## 2023-05-31 NOTE — Plan of Care (Signed)
  Problem: Education: Goal: Utilization of techniques to improve thought processes will improve Outcome: Progressing Goal: Knowledge of the prescribed therapeutic regimen will improve Outcome: Progressing   Problem: Activity: Goal: Interest or engagement in leisure activities will improve Outcome: Progressing   

## 2023-05-31 NOTE — Group Note (Signed)
 Date:  05/31/2023 Time:  3:05 PM  Group Topic/Focus:  Outside Rec/Group Movement The purpose of this group is to allow patients to go outside and get fresh air while listening to relaxing music.   Participation Level:  Did Not Attend  Participation Quality:    Affect:    Cognitive:    Insight:   Engagement in Group:    Modes of Intervention:    Additional Comments:  Did not attend   Krysti Hickling T Tyona Nilsen 05/31/2023, 3:05 PM

## 2023-05-31 NOTE — Group Note (Signed)
 Date:  05/31/2023 Time:  9:34 PM  Group Topic/Focus:  Wrap-Up Group:   The focus of this group is to help patients review their daily goal of treatment and discuss progress on daily workbooks.    Participation Level:  Did Not Attend  Participation Quality:      Affect:      Cognitive:      Insight: None  Engagement in Group:      Modes of Intervention:  Discussion  Additional Comments:    Ariyonna Twichell Luretha Rued 05/31/2023, 9:34 PM

## 2023-05-31 NOTE — Progress Notes (Signed)

## 2023-05-31 NOTE — [facility] Suicide Risk Assessment (Signed)
 [facility] INPATIENT:  Family/Significant Other Suicide Prevention Education  Suicide Prevention Education:  Education Completed; [facility], friend, [phone], has been identified by the patient as the family member/significant other with whom the patient will be residing, and identified as the person(s) who will aid the patient in the event of a mental health crisis (suicidal ideations/suicide attempt).  With written consent from the patient, the family member/significant other has been provided the following suicide prevention education, prior to the and/or following the discharge of the patient.  The suicide prevention education provided includes the following: Suicide risk factors Suicide prevention and interventions National Suicide Hotline telephone number [facility] assessment telephone number [facility] Emergency Assistance 911 [facility] and/or Residential Mobile Crisis Unit telephone number  Request made of family/significant other to: Remove weapons (e.g., guns, rifles, knives), all items previously/currently identified as safety concern.   Remove drugs/medications (over-the-counter, prescriptions, illicit drugs), all items previously/currently identified as a safety concern.  The family member/significant other verbalizes understanding of the suicide prevention education information provided.  The family member/significant other agrees to remove the items of safety concern listed above.  The LCSWA contacted the patient friend to provided SPI. The patient friend stated that she lives out of state. She stated that patient has no where to go after discharge and that it would be best for the patient to go to a long term treatment center. She stated that the patient has no support from her children and that she has burned a lot of bridges. She provided the LCSWA with the patient cousin contact information Delora Fuel, [phone]. Stating that the she would be  one form of support for the patient. She stated that the patient has no access to guns.      Marshell Levan 05/31/2023, 2:46 PM

## 2023-06-01 DIAGNOSIS — F332 Major depressive disorder, recurrent severe without psychotic features: Secondary | ICD-10-CM | POA: Diagnosis not present

## 2023-06-01 NOTE — Progress Notes (Signed)
   06/01/23 0500  Psych Admission Type (Psych Patients Only)  Admission Status Voluntary  Psychosocial Assessment  Patient Complaints None  Eye Contact Fair  Facial Expression Animated  Affect Appropriate to circumstance  Speech Logical/coherent  Interaction Assertive  Motor Activity Slow  Appearance/Hygiene Unremarkable  Behavior Characteristics Cooperative  Mood Pleasant  Aggressive Behavior  Effect No apparent injury  Thought Process  Coherency WDL  Content WDL  Delusions None reported or observed  Perception WDL  Hallucination None reported or observed  Judgment Impaired  Confusion None  Danger to Self  Current suicidal ideation? Denies  Danger to Others  Danger to Others None reported or observed

## 2023-06-01 NOTE — Progress Notes (Signed)
   06/01/23 0740  Psych Admission Type (Psych Patients Only)  Admission Status Voluntary  Psychosocial Assessment  Patient Complaints None  Eye Contact Fair  Facial Expression Blank  Affect Appropriate to circumstance  Speech Logical/coherent  Interaction Assertive  Motor Activity Slow  Appearance/Hygiene Unremarkable  Behavior Characteristics Cooperative  Mood Pleasant  Thought Process  Coherency WDL  Content WDL  Delusions None reported or observed  Perception WDL  Hallucination None reported or observed  Judgment Impaired  Confusion None  Danger to Self  Current suicidal ideation? Denies  Danger to Others  Danger to Others None reported or observed

## 2023-06-01 NOTE — Progress Notes (Signed)
 [facility] MD Progress Note  06/01/2023 8:20 PM Natalie Edwards  MRN:  [id number]  Natalie Edwards is a 58 y.o. female admitted: Presented to the [facility] 05/22/2023  6:47 PM for cocaine overdose. She carries the psychiatric diagnoses of Major depressive disorder, recurrent without psychotic features, Cocaine abuse and previous suicide attempt, and has a past medical history of HTN, GERD. Her current presentation of recent cocaine overdose with past history of MDD and suicide attempts is most consistent with major depressive disorder, recurrent vs cocaine induced mood disorder. She meets criteria for inpatient psychiatric admission based on recent cocaine overdose and risk of harm to self.  Patient is admitted to the geropsychiatry unit for further stabilization   Subjective:  Chart reviewed, case discussed in multidisciplinary meeting, patient seen during rounds.  Per staff report patient is doing fine on the unit.  On assessment patient endorsed fine mood.  She reports her sleep and appetite is improved.  She reports she has been attending groups and working on coping strategies.  She denies SI/HI/intent/plan.  She wants  to go to rehab and is working with the social work team on rehab options.  She denies any active SI/HI/intent/plan.  She denies auditory/visual hallucinations.  Sleep: Fair  Appetite:  Fair  Past Psychiatric History: see h&P Family History:  Family History  Problem Relation Age of Onset   Cancer Maternal Uncle        Lung   Cancer Maternal Uncle        Lung   Cancer Maternal Uncle        Lung   Headache Neg Hx        "I don't think so"   Migraines Neg Hx        "I don't think so"   Social History:  Social History   Substance and Sexual Activity  Alcohol Use Yes   Comment: occasionally - last drink was July 4, 1 shot     Social History   Substance and Sexual Activity  Drug Use No    Social History   Socioeconomic History   Marital status: Single    Spouse name: Not on file    Number of children: 3   Years of education: Not on file   Highest education level: 12th grade  Occupational History   Occupation: environmental services at [employer]  Tobacco Use   Smoking status: Every Day    Current packs/day: 0.50    Average packs/day: 0.5 packs/day for 25.0 years (12.5 ttl pk-yrs)    Types: Cigarettes   Smokeless tobacco: Never   Tobacco comments:    Pt tried to quit - helps with anxiety     1 pack last patient 3 days   Vaping Use   Vaping status: Never Used  Substance and Sexual Activity   Alcohol use: Yes    Comment: occasionally - last drink was July 4, 1 shot   Drug use: No   Sexual activity: Yes    Comment: hysterectomy  Other Topics Concern   Not on file  Social History Narrative   Lives at home in an apartment. Her mother lives with her.    Right handed   Caffeine: drinks approx. 36 oz of pepsi per day. Sometimes drinks 2 cups of coffee in a day as well.    Social Drivers of Corporate investment banker Strain: Low Risk  (03/06/2023)   Overall Financial Resource Strain (CARDIA)    Difficulty of Paying Living Expenses: Not very hard  Food Insecurity: Patient Declined (05/26/2023)   Hunger Vital Sign    Worried About Running Out of Food in the Last Year: Patient declined    Ran Out of Food in the Last Year: Patient declined  Recent Concern: Food Insecurity - Food Insecurity Present (05/24/2023)   Hunger Vital Sign    Worried About Running Out of Food in the Last Year: Sometimes true    Ran Out of Food in the Last Year: Often true  Transportation Needs: No Transportation Needs (05/26/2023)   PRAPARE - Administrator, Civil Service (Medical): No    Lack of Transportation (Non-Medical): No  Recent Concern: Transportation Needs - Unmet Transportation Needs (05/24/2023)   PRAPARE - Administrator, Civil Service (Medical): Yes    Lack of Transportation (Non-Medical): No  Physical Activity: Sufficiently Active (05/22/2022)    Exercise Vital Sign    Days of Exercise per Week: 4 days    Minutes of Exercise per Session: 70 min  Stress: Stress Concern Present (03/06/2023)   [employer] of Occupational Health - Occupational Stress Questionnaire    Feeling of Stress : Very much  Social Connections: Socially Isolated (05/26/2023)   Social Connection and Isolation Panel [NHANES]    Frequency of Communication with Friends and Family: More than three times a week    Frequency of Social Gatherings with Friends and Family: More than three times a week    Attends Religious Services: Never    Database administrator or Organizations: No    Attends Engineer, structural: Never    Marital Status: Divorced   Past Medical History:  Past Medical History:  Diagnosis Date   Allergy    Anemia    Diverticulosis    Gall bladder stones 1993   Gastric ulcer    GERD (gastroesophageal reflux disease)    Hypertension    Renal mass     Past Surgical History:  Procedure Laterality Date   ABDOMINAL HYSTERECTOMY     BIOPSY  09/29/2022   Procedure: BIOPSY;  Surgeon: Napoleon Form, MD;  Location: [facility];  Service: Gastroenterology;;   CHOLECYSTECTOMY     COLONOSCOPY WITH ESOPHAGOGASTRODUODENOSCOPY (EGD)  04/07/2023   [facility]   ESOPHAGOGASTRODUODENOSCOPY (EGD) WITH PROPOFOL N/A 09/29/2022   Procedure: ESOPHAGOGASTRODUODENOSCOPY (EGD) WITH PROPOFOL;  Surgeon: Napoleon Form, MD;  Location: [facility];  Service: Gastroenterology;  Laterality: N/A;    Current Medications: Current Facility-Administered Medications  Medication Dose Route Frequency Provider Last Rate Last Admin   acetaminophen (TYLENOL) tablet 650 mg  650 mg Oral Q6H PRN Maryagnes Amos, FNP   650 mg at 06/01/23 1454   albuterol (PROVENTIL) (2.5 MG/3ML) 0.083% nebulizer solution 2.5 mg  2.5 mg Nebulization Q2H PRN Starkes-Perry, Juel Burrow, FNP       alum & mag hydroxide-simeth (MAALOX/MYLANTA) 200-200-20 MG/5ML suspension 30  mL  30 mL Oral Q4H PRN Starkes-Perry, Juel Burrow, FNP       FLUoxetine (PROZAC) capsule 20 mg  20 mg Oral Daily Maryagnes Amos, FNP   20 mg at 06/01/23 1015   hydrochlorothiazide (HYDRODIURIL) tablet 25 mg  25 mg Oral Daily Delfino Lovett, MD   25 mg at 06/01/23 1015   magnesium hydroxide (MILK OF MAGNESIA) suspension 30 mL  30 mL Oral Daily PRN Maryagnes Amos, FNP       mirtazapine (REMERON) tablet 30 mg  30 mg Oral QHS Maryagnes Amos, FNP   30 mg at 05/31/23  2125   naphazoline-glycerin (CLEAR EYES REDNESS) ophth solution 1-2 drop  1-2 drop Both Eyes QID PRN Starkes-Perry, Juel Burrow, FNP       nicotine (NICODERM CQ - dosed in mg/24 hours) patch 21 mg  21 mg Transdermal Daily Verner Chol, MD   21 mg at 06/01/23 1020   OLANZapine (ZYPREXA) injection 5 mg  5 mg Intramuscular TID PRN Starkes-Perry, Juel Burrow, FNP       OLANZapine zydis (ZYPREXA) disintegrating tablet 5 mg  5 mg Oral TID PRN Rosario Adie, Juel Burrow, FNP       ondansetron (ZOFRAN) tablet 4 mg  4 mg Oral Q6H PRN Starkes-Perry, Juel Burrow, FNP       Or   ondansetron (ZOFRAN) injection 4 mg  4 mg Intravenous Q6H PRN Starkes-Perry, Juel Burrow, FNP       pantoprazole (PROTONIX) EC tablet 40 mg  40 mg Oral Daily Maryagnes Amos, FNP   40 mg at 06/01/23 1015   risperiDONE (RISPERDAL) tablet 0.5 mg  0.5 mg Oral Daily Maryagnes Amos, FNP   0.5 mg at 06/01/23 1015   traMADol (ULTRAM) tablet 50 mg  50 mg Oral Q8H PRN Verner Chol, MD   50 mg at 06/01/23 1026   traZODone (DESYREL) tablet 50 mg  50 mg Oral QHS Verner Chol, MD   50 mg at 05/31/23 2125    Lab Results:  No results found for this or any previous visit (from the past 48 hours).   Blood Alcohol level:  Lab Results  Component Value Date   ETH <10 05/22/2023   ETH <10 12/22/2022    Metabolic Disorder Labs: Lab Results  Component Value Date   HGBA1C 5.2 05/22/2023   MPG 102.54 05/22/2023   No results found for: "PROLACTIN" Lab Results   Component Value Date   CHOL 183 05/29/2023   TRIG 87 05/29/2023   HDL 63 05/29/2023   CHOLHDL 2.9 05/29/2023   VLDL 17 05/29/2023   LDLCALC 103 (H) 05/29/2023   LDLCALC 106 (H) 12/13/2021      Psychiatric Specialty Exam:  Presentation  General Appearance:  Appropriate for Environment; Casual  Eye Contact: Fair  Speech: Clear and Coherent  Speech Volume: Normal    Mood and Affect  Mood: "Fine"  Affect: Constricted   Thought Process  Thought Processes: Coherent  Descriptions of Associations:Intact  Orientation:Full (Time, Place and Person)  Thought Content:Logical  Hallucinations:Hallucinations: None  Ideas of Reference:None  Suicidal Thoughts:Suicidal Thoughts: No  Homicidal Thoughts:Homicidal Thoughts: No   Sensorium  Memory: Immediate Fair; Recent Fair; Remote Fair  Judgment: Improving  Insight: Shallow   Executive Functions  Concentration: Fair  Attention Span: Fair  Recall: [employer] of Knowledge: Fair  Language: Fair   Psychomotor Activity  Psychomotor Activity: Psychomotor Activity: Normal  Musculoskeletal: Strength & Muscle Tone: within normal limits Gait & Station: normal Assets  Assets: Manufacturing systems engineer; Desire for Improvement; Physical Health    Physical Exam: Physical Exam Vitals and nursing note reviewed.  HENT:     Head: Normocephalic.     Nose: Nose normal.     Mouth/Throat:     Mouth: Mucous membranes are moist.  Cardiovascular:     Rate and Rhythm: Normal rate.     Pulses: Normal pulses.  Pulmonary:     Effort: Pulmonary effort is normal.  Abdominal:     General: Bowel sounds are normal.  Skin:    General: Skin is warm.  Neurological:     General:  No focal deficit present.     Mental Status: She is alert.    Review of Systems  Constitutional: Negative.   HENT: Negative.    Eyes: Negative.   Respiratory: Negative.    Cardiovascular: Negative.   Gastrointestinal: Negative.    Genitourinary: Negative.   Neurological: Negative.    Blood pressure 115/67, pulse 78, temperature 98.4 F (36.9 C), resp. rate 15, height 5\' 4"  (1.626 m), weight 82.8 kg, SpO2 96%. Body mass index is 31.33 kg/m.  Diagnosis: Principal Problem:   MDD (major depressive disorder), recurrent episode, severe (HCC) Active Problems:   Essential hypertension   MDD (major depressive disorder), recurrent severe, without psychosis (HCC)   Ingestion of substance, intentional self-harm, sequela (HCC)   Cocaine abuse (HCC)   Pain and swelling of lower extremity   Treatment Plan Summary:   Safety and Monitoring:             -- Involuntary admission to inpatient psychiatric unit for safety, stabilization and treatment             -- Daily contact with patient to assess and evaluate symptoms and progress in treatment             -- Patient's case to be discussed in multi-disciplinary team meeting             -- Observation Level: q15 minute checks             -- Vital signs:  q12 hours             -- Precautions: suicide, elopement, and assault   2. Psychiatric Diagnoses and Treatment:               In the ED got restarted on her Prozac 20 mg daily, Remeron 30 mg nightly Risperdal 0.5 mg daily, added trazodone 50 mg nightly for insomnia   -- The risks/benefits/side-effects/alternatives to this medication were discussed in detail with the patient and time was given for questions. The patient consents to medication trial.                -- Metabolic profile and EKG monitoring obtained while on an atypical antipsychotic (BMI: Lipid Panel: HbgA1c: QTc:)              -- Encouraged patient to participate in unit milieu and in scheduled group therapies                            3. Medical Issues Being Addressed:  Bilateral pitting edema of lower extremities-hospitalist consult made and EKG stat ordered   4. Discharge Planning:              -- Social work and case management to assist with  discharge planning and identification of hospital follow-up needs prior to discharge             -- Estimated LOS: 5-7 days             -- Discharge Concerns: Need to establish a safety plan; Medication compliance and effectiveness             -- Discharge Goals: Return home with outpatient referrals follow ups   Physician Treatment Plan for Primary Diagnosis: MDD (major depressive disorder), recurrent episode, severe (HCC) Long Term Goal(s): Improvement in symptoms so as ready for discharge   Short Term Goals: Ability to identify changes in lifestyle to reduce recurrence of condition will improve, Ability  to verbalize feelings will improve, Ability to disclose and discuss suicidal ideas, Ability to demonstrate self-control will improve, and Ability to identify and develop effective coping behaviors will improve     Lewanda Rife, MD 06/01/2023, 8:20 PM

## 2023-06-01 NOTE — Plan of Care (Signed)
  Problem: Activity: Goal: Interest or engagement in leisure activities will improve Outcome: Progressing Goal: Imbalance in normal sleep/wake cycle will improve Outcome: Progressing   Problem: Education: Goal: Utilization of techniques to improve thought processes will improve Outcome: Progressing Goal: Knowledge of the prescribed therapeutic regimen will improve Outcome: Progressing

## 2023-06-01 NOTE — Group Note (Signed)
 Date:  06/01/2023 Time:  11:43 PM  Group Topic/Focus:  Wrap-Up Group:   The focus of this group is to help patients review their daily goal of treatment and discuss progress on daily workbooks.    Participation Level:  Did Not Attend  Participation Quality:      Affect:      Cognitive:      Insight: None  Engagement in Group:  None  Modes of Intervention:      Additional Comments:    Maeola Harman 06/01/2023, 11:43 PM

## 2023-06-01 NOTE — Group Note (Signed)
 Recreation Therapy Group Note   Group Topic:Health and Wellness  Group Date: 06/01/2023 Start Time: 1520 End Time: 1620 Facilitators: Rosina Lowenstein, LRT, CTRS Location: Courtyard  Group Description: Outdoor Recreation. Patients had the option to play corn hole, ring toss, bowling or listening to music while outside in the courtyard getting fresh air and sunlight. LRT and patients discussed things that they enjoy doing in their free time outside of the hospital. LRT encouraged patients to drink water after being active and getting their heart rate up.   Goal Area(s) Addressed: Patient will identify leisure interests.  Patient will practice healthy decision making. Patient will engage in recreation activity.   Affect/Mood: N/A   Participation Level: Did not attend    Clinical Observations/Individualized Feedback: Patient did not attend group.   Plan: Continue to engage patient in RT group sessions 2-3x/week.   Rosina Lowenstein, LRT, CTRS 06/01/2023 5:13 PM

## 2023-06-01 NOTE — Group Note (Signed)
 Recreation Therapy Group Note   Group Topic:Stress Management  Group Date: 06/01/2023 Start Time: 1100 End Time: 1140 Facilitators: Rosina Lowenstein, LRT, CTRS Location:  Dayroom  Group Description: PMR (Progressive Muscle Relaxation). LRT educates patients on what PMR is and the benefits that come from it. Patients are asked to sit with their feet flat on the floor while sitting up and all the way back in their chair, if possible. LRT and pts follow a prompt through a speaker that requires you to tense and release different muscles in their body and focus on their breathing. During session, lights are off and soft music is being played. Pts are given a stress ball to use if needed.  LRT and patients discuss how this technique can be used as a coping skill post-discharge.   Goal Area(s) Addressed:  Patients will be able to describe progressive muscle relaxation.  Patient will practice using relaxation technique. Patient will identify a new coping skill.  Patient will follow multistep directions to reduce anxiety and stress   Affect/Mood: N/A   Participation Level: Did not attend    Clinical Observations/Individualized Feedback: Patient did not attend group.   Plan: Continue to engage patient in RT group sessions 2-3x/week.   Rosina Lowenstein, LRT, CTRS 06/01/2023 1:57 PM

## 2023-06-01 NOTE — BH IP Treatment Plan (Signed)
 Interdisciplinary Treatment and Diagnostic Plan Update  06/01/2023 Time of Session: 9:30 AM  Natalie Edwards MRN: [id number]  Principal Diagnosis: MDD (major depressive disorder), recurrent episode, severe (HCC)  Secondary Diagnoses: Principal Problem:   MDD (major depressive disorder), recurrent episode, severe (HCC) Active Problems:   Essential hypertension   MDD (major depressive disorder), recurrent severe, without psychosis (HCC)   Ingestion of substance, intentional self-harm, sequela (HCC)   Cocaine abuse (HCC)   Pain and swelling of lower extremity   Current Medications:  Current Facility-Administered Medications  Medication Dose Route Frequency Provider Last Rate Last Admin   acetaminophen (TYLENOL) tablet 650 mg  650 mg Oral Q6H PRN Maryagnes Amos, FNP   650 mg at 05/31/23 1340   albuterol (PROVENTIL) (2.5 MG/3ML) 0.083% nebulizer solution 2.5 mg  2.5 mg Nebulization Q2H PRN Starkes-Perry, Juel Burrow, FNP       alum & mag hydroxide-simeth (MAALOX/MYLANTA) 200-200-20 MG/5ML suspension 30 mL  30 mL Oral Q4H PRN Starkes-Perry, Juel Burrow, FNP       FLUoxetine (PROZAC) capsule 20 mg  20 mg Oral Daily Maryagnes Amos, FNP   20 mg at 06/01/23 1015   hydrochlorothiazide (HYDRODIURIL) tablet 25 mg  25 mg Oral Daily Delfino Lovett, MD   25 mg at 06/01/23 1015   magnesium hydroxide (MILK OF MAGNESIA) suspension 30 mL  30 mL Oral Daily PRN Maryagnes Amos, FNP       mirtazapine (REMERON) tablet 30 mg  30 mg Oral QHS Maryagnes Amos, FNP   30 mg at 05/31/23 2125   naphazoline-glycerin (CLEAR EYES REDNESS) ophth solution 1-2 drop  1-2 drop Both Eyes QID PRN Starkes-Perry, Juel Burrow, FNP       nicotine (NICODERM CQ - dosed in mg/24 hours) patch 21 mg  21 mg Transdermal Daily Verner Chol, MD   21 mg at 06/01/23 1020   OLANZapine (ZYPREXA) injection 5 mg  5 mg Intramuscular TID PRN Starkes-Perry, Juel Burrow, FNP       OLANZapine zydis (ZYPREXA) disintegrating tablet 5 mg  5 mg  Oral TID PRN Rosario Adie, Juel Burrow, FNP       ondansetron (ZOFRAN) tablet 4 mg  4 mg Oral Q6H PRN Starkes-Perry, Juel Burrow, FNP       Or   ondansetron (ZOFRAN) injection 4 mg  4 mg Intravenous Q6H PRN Starkes-Perry, Juel Burrow, FNP       pantoprazole (PROTONIX) EC tablet 40 mg  40 mg Oral Daily Maryagnes Amos, FNP   40 mg at 06/01/23 1015   risperiDONE (RISPERDAL) tablet 0.5 mg  0.5 mg Oral Daily Maryagnes Amos, FNP   0.5 mg at 06/01/23 1015   traMADol (ULTRAM) tablet 50 mg  50 mg Oral Q8H PRN Verner Chol, MD   50 mg at 06/01/23 1026   traZODone (DESYREL) tablet 50 mg  50 mg Oral QHS Verner Chol, MD   50 mg at 05/31/23 2125   PTA Medications: Medications Prior to Admission  Medication Sig Dispense Refill Last Dose/Taking   acetaminophen (TYLENOL) 500 MG tablet Take 500 mg by mouth every 6 (six) hours as needed for moderate pain (pain score 4-6).      amLODipine (NORVASC) 10 MG tablet Take 1 tablet (10 mg total) by mouth daily.      ciprofloxacin (CIPRO) 500 MG tablet Take 1 tablet (500 mg total) by mouth 2 (two) times daily.      dicyclomine (BENTYL) 20 MG tablet Take 1 tablet (20 mg total) by  mouth 3 (three) times daily before meals. 90 tablet 0    FLUoxetine (PROZAC) 20 MG capsule Take 1 capsule (20 mg total) by mouth daily. 30 capsule 3    mirtazapine (REMERON) 45 MG tablet TAKE 1 TABLET BY MOUTH AT  BEDTIME 90 tablet 3    pantoprazole (PROTONIX) 40 MG tablet Take 1 tablet (40 mg total) by mouth daily. (Patient taking differently: Take 40 mg by mouth daily as needed (acid reflux).) 30 tablet 3     Patient Stressors:    Patient Strengths:    Treatment Modalities: Medication Management, Group therapy, Case management,  1 to 1 session with clinician, Psychoeducation, Recreational therapy.   Physician Treatment Plan for Primary Diagnosis: MDD (major depressive disorder), recurrent episode, severe (HCC) Long Term Goal(s): Improvement in symptoms so as ready for  discharge   Short Term Goals: Ability to identify changes in lifestyle to reduce recurrence of condition will improve Ability to verbalize feelings will improve Ability to disclose and discuss suicidal ideas Ability to demonstrate self-control will improve Ability to identify and develop effective coping behaviors will improve Ability to maintain clinical measurements within normal limits will improve Compliance with prescribed medications will improve Ability to identify triggers associated with substance abuse/mental health issues will improve  Medication Management: Evaluate patient's response, side effects, and tolerance of medication regimen.  Therapeutic Interventions: 1 to 1 sessions, Unit Group sessions and Medication administration.  Evaluation of Outcomes: Progressing  Physician Treatment Plan for Secondary Diagnosis: Principal Problem:   MDD (major depressive disorder), recurrent episode, severe (HCC) Active Problems:   Essential hypertension   MDD (major depressive disorder), recurrent severe, without psychosis (HCC)   Ingestion of substance, intentional self-harm, sequela (HCC)   Cocaine abuse (HCC)   Pain and swelling of lower extremity  Long Term Goal(s): Improvement in symptoms so as ready for discharge   Short Term Goals: Ability to identify changes in lifestyle to reduce recurrence of condition will improve Ability to verbalize feelings will improve Ability to disclose and discuss suicidal ideas Ability to demonstrate self-control will improve Ability to identify and develop effective coping behaviors will improve Ability to maintain clinical measurements within normal limits will improve Compliance with prescribed medications will improve Ability to identify triggers associated with substance abuse/mental health issues will improve     Medication Management: Evaluate patient's response, side effects, and tolerance of medication regimen.  Therapeutic  Interventions: 1 to 1 sessions, Unit Group sessions and Medication administration.  Evaluation of Outcomes: Progressing   RN Treatment Plan for Primary Diagnosis: MDD (major depressive disorder), recurrent episode, severe (HCC) Long Term Goal(s): Knowledge of disease and therapeutic regimen to maintain health will improve  Short Term Goals: Ability to remain free from injury will improve, Ability to verbalize frustration and anger appropriately will improve, Ability to demonstrate self-control, Ability to participate in decision making will improve, Ability to verbalize feelings will improve, Ability to disclose and discuss suicidal ideas, Ability to identify and develop effective coping behaviors will improve, and Compliance with prescribed medications will improve  Medication Management: RN will administer medications as ordered by provider, will assess and evaluate patient's response and provide education to patient for prescribed medication. RN will report any adverse and/or side effects to prescribing provider.  Therapeutic Interventions: 1 on 1 counseling sessions, Psychoeducation, Medication administration, Evaluate responses to treatment, Monitor vital signs and CBGs as ordered, Perform/monitor CIWA, COWS, AIMS and Fall Risk screenings as ordered, Perform wound care treatments as ordered.  Evaluation of Outcomes: Progressing  LCSW Treatment Plan for Primary Diagnosis: MDD (major depressive disorder), recurrent episode, severe (HCC) Long Term Goal(s): Safe transition to appropriate next level of care at discharge, Engage patient in therapeutic group addressing interpersonal concerns.  Short Term Goals: Engage patient in aftercare planning with referrals and resources, Increase social support, Increase ability to appropriately verbalize feelings, Increase emotional regulation, Facilitate acceptance of mental health diagnosis and concerns, Facilitate patient progression through stages of  change regarding substance use diagnoses and concerns, Identify triggers associated with mental health/substance abuse issues, and Increase skills for wellness and recovery  Therapeutic Interventions: Assess for all discharge needs, 1 to 1 time with Social worker, Explore available resources and support systems, Assess for adequacy in community support network, Educate family and significant other(s) on suicide prevention, Complete Psychosocial Assessment, Interpersonal group therapy.  Evaluation of Outcomes: Progressing   Progress in Treatment: Attending groups: Yes. and No. Participating in groups: Yes. and No. Taking medication as prescribed: Yes. Toleration medication: Yes. Family/Significant other contact made: No, will contact:  CSW will contact if given permission Patient understands diagnosis: Yes. Discussing patient identified problems/goals with staff: Yes. Medical problems stabilized or resolved: Yes. Denies suicidal/homicidal ideation: Yes. Issues/concerns per patient self-inventory: No. Other: None    New problem(s) identified: No, Describe:  None identified Update 06/01/23: No changes at this time    New Short Term/Long Term Goal(s): detox, elimination of symptoms of psychosis, medication management for mood stabilization; elimination of SI thoughts; development of comprehensive mental wellness/sobriety plan. Update 06/01/23: No changes at this time    Patient Goals:  " I just need somewhere to stay till I get back there. Just get myself better while I'm here" Update 06/01/23: No changes at this time    Discharge Plan or Barriers: CSW will assist with appropriate discharge planning Update 06/01/23: No changes at this time    Reason for Continuation of Hospitalization: Depression Medication stabilization   Estimated Length of Stay: 1 to 7 days Update 06/01/23: TBD  Last 3 [place] Suicide Severity Risk Score: Flowsheet Row Admission (Current) from 05/26/2023 in [facility]  BEHAVIORAL MEDICINE ED to Hosp-Admission (Discharged) from 05/22/2023 in [facility] ED from 03/15/2023 in [facility] Emergency Department at [facility]  C-SSRS RISK CATEGORY No Risk No Risk No Risk       Last [facility] 2/9 Scores:    03/10/2023   10:16 AM 10/28/2022    2:47 PM 10/08/2022   10:41 AM  Depression screen PHQ 2/9  Decreased Interest 1 0 0  Down, Depressed, Hopeless 0 0 0  PHQ - 2 Score 1 0 0  Altered sleeping  0 0  Tired, decreased energy  0 0  Change in appetite  0 0  Feeling bad or failure about yourself   0 0  Trouble concentrating  0 0  Moving slowly or fidgety/restless  0 0  Suicidal thoughts  0 0  PHQ-9 Score  0 0  Difficult doing work/chores  Not difficult at all Not difficult at all    Scribe for Treatment Team: Elza Rafter, [facility] 06/01/2023 10:52 AM

## 2023-06-02 DIAGNOSIS — F332 Major depressive disorder, recurrent severe without psychotic features: Secondary | ICD-10-CM | POA: Diagnosis not present

## 2023-06-02 NOTE — Group Note (Signed)
 Date:  06/02/2023 Time:  10:58 AM  Group Topic/Focus:  Orientation:   The focus of this group is to educate the patient on the purpose and policies of crisis stabilization and provide a format to answer questions about their admission.  The group details unit policies and expectations of patients while admitted.    Participation Level:  Active  Participation Quality:  Attentive  Affect:  Appropriate  Cognitive:  Appropriate  Insight: Appropriate  Engagement in Group:  Engaged  Modes of Intervention:  Socialization  Additional Comments:     Alexis Frock 06/02/2023, 10:58 AM

## 2023-06-02 NOTE — Progress Notes (Signed)
 [facility] MD Progress Note  06/02/2023 8:51 PM Nellene Courtois  MRN:  [id number]  Natalie Edwards is a 58 y.o. female admitted: Presented to the [facility] 05/22/2023  6:47 PM for cocaine overdose. She carries the psychiatric diagnoses of Major depressive disorder, recurrent without psychotic features, Cocaine abuse and previous suicide attempt, and has a past medical history of HTN, GERD. Her current presentation of recent cocaine overdose with past history of MDD and suicide attempts is most consistent with major depressive disorder, recurrent vs cocaine induced mood disorder. She meets criteria for inpatient psychiatric admission based on recent cocaine overdose and risk of harm to self.  Patient is admitted to the geropsychiatry unit for further stabilization   Subjective:  Chart reviewed, case discussed in multidisciplinary meeting, patient seen during rounds.  Per staff report patient is doing fine on the unit.  On assessment patient endorsed fine mood.  She reports her sleep and appetite is improved.  She reports she has been attending groups and working on coping strategies.  She is working with the social work team on rehab options.  She denies any active SI/HI/intent/plan.  She denies auditory/visual hallucinations.  Sleep: Fair  Appetite:  Fair  Past Psychiatric History: see h&P Family History:  Family History  Problem Relation Age of Onset   Cancer Maternal Uncle        Lung   Cancer Maternal Uncle        Lung   Cancer Maternal Uncle        Lung   Headache Neg Hx        "I don't think so"   Migraines Neg Hx        "I don't think so"   Social History:  Social History   Substance and Sexual Activity  Alcohol Use Yes   Comment: occasionally - last drink was July 4, 1 shot     Social History   Substance and Sexual Activity  Drug Use No    Social History   Socioeconomic History   Marital status: Single    Spouse name: Not on file   Number of children: 3   Years of education: Not on  file   Highest education level: 12th grade  Occupational History   Occupation: environmental services at [employer]  Tobacco Use   Smoking status: Every Day    Current packs/day: 0.50    Average packs/day: 0.5 packs/day for 25.0 years (12.5 ttl pk-yrs)    Types: Cigarettes   Smokeless tobacco: Never   Tobacco comments:    Pt tried to quit - helps with anxiety     1 pack last patient 3 days   Vaping Use   Vaping status: Never Used  Substance and Sexual Activity   Alcohol use: Yes    Comment: occasionally - last drink was July 4, 1 shot   Drug use: No   Sexual activity: Yes    Comment: hysterectomy  Other Topics Concern   Not on file  Social History Narrative   Lives at home in an apartment. Her mother lives with her.    Right handed   Caffeine: drinks approx. 36 oz of pepsi per day. Sometimes drinks 2 cups of coffee in a day as well.    Social Drivers of Health   Financial Resource Strain: Low Risk  (03/06/2023)   Overall Financial Resource Strain (CARDIA)    Difficulty of Paying Living Expenses: Not very hard  Food Insecurity: Patient Declined (05/26/2023)   Hunger Vital Sign  Worried About Programme researcher, broadcasting/film/video in the Last Year: Patient declined    Barista in the Last Year: Patient declined  Recent Concern: Food Insecurity - Food Insecurity Present (05/24/2023)   Hunger Vital Sign    Worried About Running Out of Food in the Last Year: Sometimes true    Ran Out of Food in the Last Year: Often true  Transportation Needs: No Transportation Needs (05/26/2023)   PRAPARE - Administrator, Civil Service (Medical): No    Lack of Transportation (Non-Medical): No  Recent Concern: Transportation Needs - Unmet Transportation Needs (05/24/2023)   PRAPARE - Administrator, Civil Service (Medical): Yes    Lack of Transportation (Non-Medical): No  Physical Activity: Sufficiently Active (05/22/2022)   Exercise Vital Sign    Days of Exercise per Week: 4 days     Minutes of Exercise per Session: 70 min  Stress: Stress Concern Present (03/06/2023)   [employer] of Occupational Health - Occupational Stress Questionnaire    Feeling of Stress : Very much  Social Connections: Socially Isolated (05/26/2023)   Social Connection and Isolation Panel [NHANES]    Frequency of Communication with Friends and Family: More than three times a week    Frequency of Social Gatherings with Friends and Family: More than three times a week    Attends Religious Services: Never    Database administrator or Organizations: No    Attends Engineer, structural: Never    Marital Status: Divorced   Past Medical History:  Past Medical History:  Diagnosis Date   Allergy    Anemia    Diverticulosis    Gall bladder stones 1993   Gastric ulcer    GERD (gastroesophageal reflux disease)    Hypertension    Renal mass     Past Surgical History:  Procedure Laterality Date   ABDOMINAL HYSTERECTOMY     BIOPSY  09/29/2022   Procedure: BIOPSY;  Surgeon: Napoleon Form, MD;  Location: [facility];  Service: Gastroenterology;;   CHOLECYSTECTOMY     COLONOSCOPY WITH ESOPHAGOGASTRODUODENOSCOPY (EGD)  04/07/2023   [facility]   ESOPHAGOGASTRODUODENOSCOPY (EGD) WITH PROPOFOL N/A 09/29/2022   Procedure: ESOPHAGOGASTRODUODENOSCOPY (EGD) WITH PROPOFOL;  Surgeon: Napoleon Form, MD;  Location: [facility];  Service: Gastroenterology;  Laterality: N/A;    Current Medications: Current Facility-Administered Medications  Medication Dose Route Frequency Provider Last Rate Last Admin   acetaminophen (TYLENOL) tablet 650 mg  650 mg Oral Q6H PRN Maryagnes Amos, FNP   650 mg at 06/02/23 1650   albuterol (PROVENTIL) (2.5 MG/3ML) 0.083% nebulizer solution 2.5 mg  2.5 mg Nebulization Q2H PRN Starkes-Perry, Juel Burrow, FNP       alum & mag hydroxide-simeth (MAALOX/MYLANTA) 200-200-20 MG/5ML suspension 30 mL  30 mL Oral Q4H PRN Starkes-Perry, Juel Burrow, FNP        FLUoxetine (PROZAC) capsule 20 mg  20 mg Oral Daily Maryagnes Amos, FNP   20 mg at 06/02/23 0859   hydrochlorothiazide (HYDRODIURIL) tablet 25 mg  25 mg Oral Daily Delfino Lovett, MD   25 mg at 06/02/23 0859   magnesium hydroxide (MILK OF MAGNESIA) suspension 30 mL  30 mL Oral Daily PRN Maryagnes Amos, FNP       mirtazapine (REMERON) tablet 30 mg  30 mg Oral QHS Maryagnes Amos, FNP   30 mg at 06/01/23 2117   naphazoline-glycerin (CLEAR EYES REDNESS) ophth solution 1-2 drop  1-2  drop Both Eyes QID PRN Starkes-Perry, Juel Burrow, FNP       nicotine (NICODERM CQ - dosed in mg/24 hours) patch 21 mg  21 mg Transdermal Daily Verner Chol, MD   21 mg at 06/02/23 0901   OLANZapine (ZYPREXA) injection 5 mg  5 mg Intramuscular TID PRN Starkes-Perry, Juel Burrow, FNP       OLANZapine zydis (ZYPREXA) disintegrating tablet 5 mg  5 mg Oral TID PRN Rosario Adie, Juel Burrow, FNP       ondansetron (ZOFRAN) tablet 4 mg  4 mg Oral Q6H PRN Starkes-Perry, Juel Burrow, FNP       Or   ondansetron (ZOFRAN) injection 4 mg  4 mg Intravenous Q6H PRN Starkes-Perry, Juel Burrow, FNP       pantoprazole (PROTONIX) EC tablet 40 mg  40 mg Oral Daily Maryagnes Amos, FNP   40 mg at 06/02/23 0859   risperiDONE (RISPERDAL) tablet 0.5 mg  0.5 mg Oral Daily Maryagnes Amos, FNP   0.5 mg at 06/02/23 0859   traMADol (ULTRAM) tablet 50 mg  50 mg Oral Q8H PRN Verner Chol, MD   50 mg at 06/02/23 0859   traZODone (DESYREL) tablet 50 mg  50 mg Oral QHS Verner Chol, MD   50 mg at 06/01/23 2117    Lab Results:  No results found for this or any previous visit (from the past 48 hours).   Blood Alcohol level:  Lab Results  Component Value Date   ETH <10 05/22/2023   ETH <10 12/22/2022    Metabolic Disorder Labs: Lab Results  Component Value Date   HGBA1C 5.2 05/22/2023   MPG 102.54 05/22/2023   No results found for: "PROLACTIN" Lab Results  Component Value Date   CHOL 183 05/29/2023   TRIG 87  05/29/2023   HDL 63 05/29/2023   CHOLHDL 2.9 05/29/2023   VLDL 17 05/29/2023   LDLCALC 103 (H) 05/29/2023   LDLCALC 106 (H) 12/13/2021      Psychiatric Specialty Exam:  Presentation  General Appearance:  Appropriate for Environment; Casual  Eye Contact: Fair  Speech: Clear and Coherent  Speech Volume: Normal    Mood and Affect  Mood: "Fine"  Affect: Constricted   Thought Process  Thought Processes: Coherent  Descriptions of Associations:Intact  Orientation:Full (Time, Place and Person)  Thought Content:Logical  Hallucinations:No data recorded  Ideas of Reference:None  Suicidal Thoughts:No data recorded  Homicidal Thoughts:No data recorded   Sensorium  Memory: Immediate Fair; Recent Fair; Remote Fair  Judgment: Improving  Insight: Shallow   Executive Functions  Concentration: Fair  Attention Span: Fair  Recall: [employer] of Knowledge: Fair  Language: Fair   Psychomotor Activity  Psychomotor Activity: No data recorded  Musculoskeletal: Strength & Muscle Tone: within normal limits Gait & Station: normal Assets  Assets: Manufacturing systems engineer; Desire for Improvement; Physical Health    Physical Exam: Physical Exam Vitals and nursing note reviewed.  HENT:     Head: Normocephalic.     Nose: Nose normal.     Mouth/Throat:     Mouth: Mucous membranes are moist.  Cardiovascular:     Rate and Rhythm: Normal rate.     Pulses: Normal pulses.  Pulmonary:     Effort: Pulmonary effort is normal.  Abdominal:     General: Bowel sounds are normal.  Skin:    General: Skin is warm.  Neurological:     General: No focal deficit present.     Mental Status: She is  alert.    Review of Systems  Constitutional: Negative.   HENT: Negative.    Eyes: Negative.   Respiratory: Negative.    Cardiovascular: Negative.   Gastrointestinal: Negative.   Genitourinary: Negative.   Neurological: Negative.    Blood pressure 115/61,  pulse 89, temperature 98.2 F (36.8 C), resp. rate 14, height 5\' 4"  (1.626 m), weight 82.8 kg, SpO2 100%. Body mass index is 31.33 kg/m.  Diagnosis: Principal Problem:   MDD (major depressive disorder), recurrent episode, severe (HCC) Active Problems:   Essential hypertension   MDD (major depressive disorder), recurrent severe, without psychosis (HCC)   Ingestion of substance, intentional self-harm, sequela (HCC)   Cocaine abuse (HCC)   Pain and swelling of lower extremity   Treatment Plan Summary:   Safety and Monitoring:             -- Involuntary admission to inpatient psychiatric unit for safety, stabilization and treatment             -- Daily contact with patient to assess and evaluate symptoms and progress in treatment             -- Patient's case to be discussed in multi-disciplinary team meeting             -- Observation Level: q15 minute checks             -- Vital signs:  q12 hours             -- Precautions: suicide, elopement, and assault   2. Psychiatric Diagnoses and Treatment:               In the ED got restarted on her Prozac 20 mg daily, Remeron 30 mg nightly Risperdal 0.5 mg daily, added trazodone 50 mg nightly for insomnia   -- The risks/benefits/side-effects/alternatives to this medication were discussed in detail with the patient and time was given for questions. The patient consents to medication trial.                -- Metabolic profile and EKG monitoring obtained while on an atypical antipsychotic (BMI: Lipid Panel: HbgA1c: QTc:)              -- Encouraged patient to participate in unit milieu and in scheduled group therapies                            3. Medical Issues Being Addressed:  None  4. Discharge Planning:              -- Social work and case management to assist with discharge planning and identification of hospital follow-up needs prior to discharge             -- Estimated LOS: 2-3 days             -- Discharge Concerns: Need to  establish a safety plan; Medication compliance and effectiveness             -- Discharge Goals: Return home with outpatient referrals follow ups   Physician Treatment Plan for Primary Diagnosis: MDD (major depressive disorder), recurrent episode, severe (HCC) Long Term Goal(s): Improvement in symptoms so as ready for discharge   Short Term Goals: Ability to identify changes in lifestyle to reduce recurrence of condition will improve, Ability to verbalize feelings will improve, Ability to disclose and discuss suicidal ideas, Ability to demonstrate self-control will improve, and Ability to identify and develop  effective coping behaviors will improve     Lewanda Rife, MD 06/02/2023, 8:51 PM

## 2023-06-02 NOTE — Group Note (Signed)
 Recreation Therapy Group Note   Group Topic:Coping Skills  Group Date: 06/02/2023 Start Time: 1100 End Time: 1140 Facilitators: Rosina Lowenstein, LRT, CTRS Location:  Dayroom  Group Description: Seated Exercise. LRT discussed the mental and physical benefits of exercise. LRT and group discussed how physical activity can be used as a coping skill. Pt's and LRT followed along to an exercise video on the TV screen that provided a visual representation and audio description of every exercise performed. Pt's encouraged to listen to their bodies and stop at any time if they experience feelings of discomfort or pain. Pts were encouraged to drink water and stay hydrated.   Goal Area(s) Addressed: Patient will learn benefits of physical activity. Patient will identify exercise as a coping skill.  Patient will follow multistep directions. Patient will try a new leisure interest.    Affect/Mood: Appropriate   Participation Level: Active and Engaged   Participation Quality: Independent   Behavior: Appropriate, Calm, and Cooperative   Speech/Thought Process: Coherent   Insight: Good   Judgement: Good   Modes of Intervention: Activity, Education, and Music   Patient Response to Interventions:  Attentive, Engaged, Interested , and Receptive   Education Outcome:  Acknowledges education   Clinical Observations/Individualized Feedback: Smith was active in their participation of session activities and group discussion. Pt completed all exercises as prompted. Pt interacted well with LRT and peers duration of session.    Plan: Continue to engage patient in RT group sessions 2-3x/week.   Rosina Lowenstein, LRT, CTRS 06/02/2023 1:18 PM

## 2023-06-02 NOTE — Group Note (Signed)
 Recreation Therapy Group Note   Group Topic:Other  Group Date: 06/02/2023 Start Time: 1500 End Time: 1605 Facilitators: Rosina Lowenstein, LRT, CTRS Location: Courtyard  Group Description: Music Reminisce. LRT encouraged patients to think of their favorite song(s) that reminded them of a positive memory or time in their life. LRT encouraged patient to talk about that memory aloud to the group. LRT played the song through a speaker for all to hear. LRT and patients discussed how thinking of a positive memory or time in their life can be used as a coping skill in everyday life post discharge.   Goal Area(s) Addressed: Patient will increase verbal communication by conversing with peers. Patient will contribute to group discussion with minimal prompting. Patient will reminisce a positive memory or moment in their life.    Affect/Mood: Appropriate and Flat   Participation Level: Minimal    Clinical Observations/Individualized Feedback: Natalie Edwards came to group late and left early. Pt minimally interacted with LRT and peers while present in group.   Plan: Continue to engage patient in RT group sessions 2-3x/week.   Rosina Lowenstein, LRT, CTRS 06/02/2023 5:01 PM

## 2023-06-02 NOTE — BHH Counselor (Signed)
 Provider reports she can lift the IVC. CSW awaits IVC being lifted.   Reynaldo Minium, MSW, [place] 06/02/2023 4:15 PM

## 2023-06-02 NOTE — BHH Counselor (Signed)
 CSW received called from [facility]  They report they need pt's SSN, and to see if the IVC can be lifted.   CSW messaged provider to inquire.   Reynaldo Minium, MSW, [place] 06/02/2023 3:32 PM

## 2023-06-02 NOTE — Group Note (Signed)
 Date:  06/02/2023 Time:  9:40 PM  Group Topic/Focus:  Wrap-Up Group:   The focus of this group is to help patients review their daily goal of treatment and discuss progress on daily workbooks.    Participation Level:  Minimal  Participation Quality:  Inattentive  Affect:  Appropriate  Cognitive:  Alert  Insight: Limited  Engagement in Group:  Limited  Modes of Intervention:  Discussion  Additional Comments:    Maeola Harman 06/02/2023, 9:40 PM

## 2023-06-02 NOTE — BHH Counselor (Signed)
 CSW called [employer] at [phone] to start referral process for pt per their request.   Fax: [phone]  They report they need medication list, H&P, and face sheet.   Reynaldo Minium, MSW, [place] 06/02/2023 1:29 PM

## 2023-06-02 NOTE — BHH Counselor (Signed)
 CSW faxed referral to [facility] at [phone] per their request   Reynaldo Minium, MSW, [facility] 06/02/2023 1:36 PM

## 2023-06-02 NOTE — Progress Notes (Signed)
   06/02/23 0200  Psych Admission Type (Psych Patients Only)  Admission Status Voluntary  Psychosocial Assessment  Patient Complaints None  Eye Contact Fair  Facial Expression Animated  Affect Appropriate to circumstance  Speech Logical/coherent  Interaction Assertive  Motor Activity Slow  Appearance/Hygiene Unremarkable  Behavior Characteristics Cooperative;Appropriate to situation  Mood Pleasant  Thought Process  Coherency WDL  Content WDL  Delusions None reported or observed  Perception WDL  Hallucination None reported or observed  Judgment Impaired  Confusion None  Danger to Self  Current suicidal ideation? Denies  Danger to Others  Danger to Others None reported or observed

## 2023-06-02 NOTE — Plan of Care (Signed)

## 2023-06-02 NOTE — BHH Counselor (Signed)
 CSW provided pt numbers to [facility] in [place] as well as residential treatment programs per their request.    Reynaldo Minium, MSW, [facility] 06/02/2023 2:12 PM

## 2023-06-03 ENCOUNTER — Ambulatory Visit: Payer: 59 | Admitting: Family Medicine

## 2023-06-03 DIAGNOSIS — F332 Major depressive disorder, recurrent severe without psychotic features: Secondary | ICD-10-CM | POA: Diagnosis not present

## 2023-06-03 NOTE — Group Note (Signed)
 Date:  06/03/2023 Time:  9:59 PM  Group Topic/Focus:  Goals Group:   The focus of this group is to help patients establish daily goals to achieve during treatment and discuss how the patient can incorporate goal setting into their daily lives to aide in recovery.    Participation Level:  Active  Participation Quality:  Appropriate and Attentive  Affect:  Appropriate  Cognitive:  Alert and Appropriate  Insight: Appropriate, Good, and Improving  Engagement in Group:  Developing/Improving and Engaged  Modes of Intervention:  Discussion, Rapport Building, and Support  Additional Comments:     Obdulia Steier 06/03/2023, 9:59 PM

## 2023-06-03 NOTE — Plan of Care (Signed)
   06/02/23 2100  Psych Admission Type (Psych Patients Only)  Admission Status Voluntary  Psychosocial Assessment  Patient Complaints None  Eye Contact Fair  Facial Expression Flat  Affect Appropriate to circumstance  Speech Logical/coherent  Interaction Assertive  Motor Activity Slow  Appearance/Hygiene In scrubs  Behavior Characteristics Appropriate to situation  Mood Pleasant  Thought Process  Coherency WDL  Content WDL  Delusions None reported or observed  Perception WDL  Hallucination None reported or observed  Judgment Impaired  Confusion None  Danger to Self  Current suicidal ideation? Denies  Danger to Others  Danger to Others None reported or observed    Problem: Education: Goal: Utilization of techniques to improve thought processes will improve Outcome: Progressing Goal: Knowledge of the prescribed therapeutic regimen will improve Outcome: Progressing   Problem: Activity: Goal: Interest or engagement in leisure activities will improve Outcome: Progressing Goal: Imbalance in normal sleep/wake cycle will improve Outcome: Progressing

## 2023-06-03 NOTE — Plan of Care (Signed)

## 2023-06-03 NOTE — Group Note (Signed)
 Therapy Group Note  Group Topic:Other  Group Date: 06/03/2023 Start Time: 1300 End Time: 1330 Facilitators: Ikea Demicco, Thomes Dinning, PT     Group Description: Group discussed impact of balance on safety and independence with functional tasks.  Identified and discussed any self-perceived balance deficits to personalize information.  Discussed and reviewed strategies to address/improve balance deficits: use of assist devices, activity pacing/energy conservation, environment/home safety modifications, focusing attention/minimizing distraction.  Reviewed and participated with standing LE therex designed to target dynamic balance reactions and LE strength/stability; provided handouts with HEP to be utilized outside of group time as appropriate.  Allowed time for questions and further discussion on any balance or mobility concerns/needs.   Therapeutic Goal(s): Identify and discuss any individual balance deficits and functional implications. Identify and discuss any environmental/home safety modifications that can optimize balance and safety for mobility within the home. Demonstrate understanding and performance of standing therex designed to target dynamic balance deficits.   Individual Participation: Did not attend     Participation Level:   Participation Quality:   Behavior:   Speech/Thought Process:   Affect/Mood:   Insight:   Judgement:   Individualization:   Modes of Intervention:   Patient Response to Interventions:    Plan: Continue to engage patient in OT groups 1 - 2x/week.  Ovidio Hanger PT, DPT 06/03/23, 3:17 PM

## 2023-06-03 NOTE — Progress Notes (Signed)
 [facility] MD Progress Note  06/03/2023 1:47 PM Natalie Edwards  MRN:  [id number]  Natalie Edwards is a 58 y.o. female admitted: Presented to the [facility] 05/22/2023  6:47 PM for cocaine overdose. She carries the psychiatric diagnoses of Major depressive disorder, recurrent without psychotic features, Cocaine abuse and previous suicide attempt, and has a past medical history of HTN, GERD. Her current presentation of recent cocaine overdose with past history of MDD and suicide attempts is most consistent with major depressive disorder, recurrent vs cocaine induced mood disorder. She meets criteria for inpatient psychiatric admission based on recent cocaine overdose and risk of harm to self.  Patient is admitted to the geropsychiatry unit for further stabilization   Subjective:  Chart reviewed, case discussed in multidisciplinary meeting, patient seen during rounds.  Per staff report patient is doing fine on the unit.  No new events reported overnight.  Patient reports that she has called to rehab facilities and is waiting to hear back.  She was encouraged to continue  calling facility and work with Child psychotherapist on the unit to secure a rehab bed.  She reports her sleep and appetite is improved.  She reports she has been attending groups and working on coping strategies. She denies any SI/HI/intent/plan.  She denies auditory/visual hallucinations.  Sleep: Fair  Appetite:  Fair  Past Psychiatric History: see h&P Family History:  Family History  Problem Relation Age of Onset   Cancer Maternal Uncle        Lung   Cancer Maternal Uncle        Lung   Cancer Maternal Uncle        Lung   Headache Neg Hx        "I don't think so"   Migraines Neg Hx        "I don't think so"   Social History:  Social History   Substance and Sexual Activity  Alcohol Use Yes   Comment: occasionally - last drink was July 4, 1 shot     Social History   Substance and Sexual Activity  Drug Use No    Social History    Socioeconomic History   Marital status: Single    Spouse name: Not on file   Number of children: 3   Years of education: Not on file   Highest education level: 12th grade  Occupational History   Occupation: environmental services at [employer]  Tobacco Use   Smoking status: Every Day    Current packs/day: 0.50    Average packs/day: 0.5 packs/day for 25.0 years (12.5 ttl pk-yrs)    Types: Cigarettes   Smokeless tobacco: Never   Tobacco comments:    Pt tried to quit - helps with anxiety     1 pack last patient 3 days   Vaping Use   Vaping status: Never Used  Substance and Sexual Activity   Alcohol use: Yes    Comment: occasionally - last drink was July 4, 1 shot   Drug use: No   Sexual activity: Yes    Comment: hysterectomy  Other Topics Concern   Not on file  Social History Narrative   Lives at home in an apartment. Her mother lives with her.    Right handed   Caffeine: drinks approx. 36 oz of pepsi per day. Sometimes drinks 2 cups of coffee in a day as well.    Social Drivers of Corporate investment banker Strain: Low Risk  (03/06/2023)   Overall Physicist, medical Strain (  CARDIA)    Difficulty of Paying Living Expenses: Not very hard  Food Insecurity: Patient Declined (05/26/2023)   Hunger Vital Sign    Worried About Running Out of Food in the Last Year: Patient declined    Ran Out of Food in the Last Year: Patient declined  Recent Concern: Food Insecurity - Food Insecurity Present (05/24/2023)   Hunger Vital Sign    Worried About Running Out of Food in the Last Year: Sometimes true    Ran Out of Food in the Last Year: Often true  Transportation Needs: No Transportation Needs (05/26/2023)   PRAPARE - Administrator, Civil Service (Medical): No    Lack of Transportation (Non-Medical): No  Recent Concern: Transportation Needs - Unmet Transportation Needs (05/24/2023)   PRAPARE - Administrator, Civil Service (Medical): Yes    Lack of  Transportation (Non-Medical): No  Physical Activity: Sufficiently Active (05/22/2022)   Exercise Vital Sign    Days of Exercise per Week: 4 days    Minutes of Exercise per Session: 70 min  Stress: Stress Concern Present (03/06/2023)   [employer] of Occupational Health - Occupational Stress Questionnaire    Feeling of Stress : Very much  Social Connections: Socially Isolated (05/26/2023)   Social Connection and Isolation Panel [NHANES]    Frequency of Communication with Friends and Family: More than three times a week    Frequency of Social Gatherings with Friends and Family: More than three times a week    Attends Religious Services: Never    Database administrator or Organizations: No    Attends Engineer, structural: Never    Marital Status: Divorced   Past Medical History:  Past Medical History:  Diagnosis Date   Allergy    Anemia    Diverticulosis    Gall bladder stones 1993   Gastric ulcer    GERD (gastroesophageal reflux disease)    Hypertension    Renal mass     Past Surgical History:  Procedure Laterality Date   ABDOMINAL HYSTERECTOMY     BIOPSY  09/29/2022   Procedure: BIOPSY;  Surgeon: Napoleon Form, MD;  Location: [facility];  Service: Gastroenterology;;   CHOLECYSTECTOMY     COLONOSCOPY WITH ESOPHAGOGASTRODUODENOSCOPY (EGD)  04/07/2023   [facility]   ESOPHAGOGASTRODUODENOSCOPY (EGD) WITH PROPOFOL N/A 09/29/2022   Procedure: ESOPHAGOGASTRODUODENOSCOPY (EGD) WITH PROPOFOL;  Surgeon: Napoleon Form, MD;  Location: [facility];  Service: Gastroenterology;  Laterality: N/A;    Current Medications: Current Facility-Administered Medications  Medication Dose Route Frequency Provider Last Rate Last Admin   acetaminophen (TYLENOL) tablet 650 mg  650 mg Oral Q6H PRN Maryagnes Amos, FNP   650 mg at 06/02/23 1650   albuterol (PROVENTIL) (2.5 MG/3ML) 0.083% nebulizer solution 2.5 mg  2.5 mg Nebulization Q2H PRN Starkes-Perry, Juel Burrow,  FNP       alum & mag hydroxide-simeth (MAALOX/MYLANTA) 200-200-20 MG/5ML suspension 30 mL  30 mL Oral Q4H PRN Starkes-Perry, Juel Burrow, FNP       FLUoxetine (PROZAC) capsule 20 mg  20 mg Oral Daily Maryagnes Amos, FNP   20 mg at 06/03/23 1015   hydrochlorothiazide (HYDRODIURIL) tablet 25 mg  25 mg Oral Daily Delfino Lovett, MD   25 mg at 06/03/23 1015   magnesium hydroxide (MILK OF MAGNESIA) suspension 30 mL  30 mL Oral Daily PRN Maryagnes Amos, FNP       mirtazapine (REMERON) tablet 30 mg  30  mg Oral QHS Maryagnes Amos, FNP   30 mg at 06/02/23 2113   naphazoline-glycerin (CLEAR EYES REDNESS) ophth solution 1-2 drop  1-2 drop Both Eyes QID PRN Starkes-Perry, Juel Burrow, FNP       nicotine (NICODERM CQ - dosed in mg/24 hours) patch 21 mg  21 mg Transdermal Daily Verner Chol, MD   21 mg at 06/03/23 1025   OLANZapine (ZYPREXA) injection 5 mg  5 mg Intramuscular TID PRN Starkes-Perry, Juel Burrow, FNP       OLANZapine zydis (ZYPREXA) disintegrating tablet 5 mg  5 mg Oral TID PRN Rosario Adie, Juel Burrow, FNP       ondansetron (ZOFRAN) tablet 4 mg  4 mg Oral Q6H PRN Maryagnes Amos, FNP       Or   ondansetron (ZOFRAN) injection 4 mg  4 mg Intravenous Q6H PRN Starkes-Perry, Juel Burrow, FNP       pantoprazole (PROTONIX) EC tablet 40 mg  40 mg Oral Daily Maryagnes Amos, FNP   40 mg at 06/03/23 1015   risperiDONE (RISPERDAL) tablet 0.5 mg  0.5 mg Oral Daily Maryagnes Amos, FNP   0.5 mg at 06/03/23 1015   traMADol (ULTRAM) tablet 50 mg  50 mg Oral Q8H PRN Verner Chol, MD   50 mg at 06/03/23 1015   traZODone (DESYREL) tablet 50 mg  50 mg Oral QHS Verner Chol, MD   50 mg at 06/02/23 2113     Blood Alcohol level:  Lab Results  Component Value Date   ETH <10 05/22/2023   ETH <10 12/22/2022    Metabolic Disorder Labs: Lab Results  Component Value Date   HGBA1C 5.2 05/22/2023   MPG 102.54 05/22/2023   No results found for: "PROLACTIN" Lab Results   Component Value Date   CHOL 183 05/29/2023   TRIG 87 05/29/2023   HDL 63 05/29/2023   CHOLHDL 2.9 05/29/2023   VLDL 17 05/29/2023   LDLCALC 103 (H) 05/29/2023   LDLCALC 106 (H) 12/13/2021      Psychiatric Specialty Exam:  Presentation  General Appearance:  Appropriate for Environment; Casual  Eye Contact: Fair  Speech: Clear and Coherent  Speech Volume: Normal    Mood and Affect  Mood: "Fine"  Affect: Stable and animated   Thought Process  Thought Processes: Coherent  Descriptions of Associations:Intact  Orientation:Full (Time, Place and Person)  Thought Content:Logical  Hallucinations: Denies  Ideas of Reference:None  Suicidal Thoughts: Denies  Homicidal Thoughts: Denies Sensorium  Memory: Immediate Fair; Recent Fair; Remote Fair  Judgment: Improving  Insight: Shallow   Executive Functions  Concentration: Fair  Attention Span: Fair  Recall: [employer] of Knowledge: Fair  Language: Fair   Psychomotor Activity  Psychomotor Activity: No data recorded  Musculoskeletal: Strength & Muscle Tone: within normal limits Gait & Station: normal Assets  Assets: Manufacturing systems engineer; Desire for Improvement; Physical Health    Physical Exam: Physical Exam Vitals and nursing note reviewed.  HENT:     Head: Normocephalic.     Nose: Nose normal.     Mouth/Throat:     Mouth: Mucous membranes are moist.  Cardiovascular:     Rate and Rhythm: Normal rate.     Pulses: Normal pulses.  Pulmonary:     Effort: Pulmonary effort is normal.  Abdominal:     General: Bowel sounds are normal.  Skin:    General: Skin is warm.  Neurological:     General: No focal deficit present.  Mental Status: She is alert.    Review of Systems  Constitutional: Negative.   HENT: Negative.    Eyes: Negative.   Respiratory: Negative.    Cardiovascular: Negative.   Gastrointestinal: Negative.   Genitourinary: Negative.   Neurological:  Negative.    Blood pressure 129/69, pulse 82, temperature 99 F (37.2 C), resp. rate 18, height 5\' 4"  (1.626 m), weight 82.8 kg, SpO2 97%. Body mass index is 31.33 kg/m.  Diagnosis: Principal Problem:   MDD (major depressive disorder), recurrent episode, severe (HCC) Active Problems:   Essential hypertension   MDD (major depressive disorder), recurrent severe, without psychosis (HCC)   Ingestion of substance, intentional self-harm, sequela (HCC)   Cocaine abuse (HCC)   Pain and swelling of lower extremity   Treatment Plan Summary:   Safety and Monitoring:             -- Involuntary admission to inpatient psychiatric unit for safety, stabilization and treatment             -- Daily contact with patient to assess and evaluate symptoms and progress in treatment             -- Patient's case to be discussed in multi-disciplinary team meeting             -- Observation Level: q15 minute checks             -- Vital signs:  q12 hours             -- Precautions: suicide, elopement, and assault   2. Psychiatric Diagnoses and Treatment:               In the ED got restarted on her Prozac 20 mg daily, Remeron 30 mg nightly Risperdal 0.5 mg daily, added trazodone 50 mg nightly for insomnia   -- The risks/benefits/side-effects/alternatives to this medication were discussed in detail with the patient and time was given for questions. The patient consents to medication trial.                -- Metabolic profile and EKG monitoring obtained while on an atypical antipsychotic (BMI: Lipid Panel: HbgA1c: QTc:)              -- Encouraged patient to participate in unit milieu and in scheduled group therapies                            3. Medical Issues Being Addressed:   None today, Patient has successfully completed her detox.  There are no medical concerns for the patient to be discharged to her rehab facility   4. Discharge Planning:              -- Social work and case management to assist  with discharge planning and identification of hospital follow-up needs prior to discharge             -- Estimated LOS: 1-2 days             -- Discharge Concerns: Need to establish a safety plan; Medication compliance and effectiveness                Physician Treatment Plan for Primary Diagnosis: MDD (major depressive disorder), recurrent episode, severe (HCC) Long Term Goal(s): Improvement in symptoms so as ready for discharge   Short Term Goals: Ability to identify changes in lifestyle to reduce recurrence of condition will improve, Ability to verbalize feelings will  improve, Ability to disclose and discuss suicidal ideas, Ability to demonstrate self-control will improve, and Ability to identify and develop effective coping behaviors will improve     Lewanda Rife, MD

## 2023-06-03 NOTE — Group Note (Signed)
 Date:  06/03/2023 Time:  10:47 AM  Group Topic/Focus:  Goals Group:   The focus of this group is to help patients establish daily goals to achieve during treatment and discuss how the patient can incorporate goal setting into their daily lives to aide in recovery.    Participation Level:  Did Not Attend   Ardelle Anton 06/03/2023, 10:47 AM

## 2023-06-04 NOTE — Plan of Care (Signed)
°  Problem: Education: Goal: Knowledge of the prescribed therapeutic regimen will improve Outcome: Progressing   Problem: Activity: Goal: Imbalance in normal sleep/wake cycle will improve Outcome: Progressing   Problem: Coping: Goal: Coping ability will improve Outcome: Progressing

## 2023-06-04 NOTE — Group Note (Signed)
 Recreation Therapy Group Note   Group Topic:Stress Management  Group Date: 06/04/2023 Start Time: 1100 End Time: 1140 Facilitators: Rosina Lowenstein, LRT, CTRS Location:  Craft Room  Group Description: Meditation. LRT and patients discussed what they know about meditation and mindfulness. LRT played a Deep Breathing Meditation exercise script for patients to follow along to. LRT and patients discussed how meditation and deep breathing can be used as a coping skill post--discharge to help manage symptoms of stress.   Goal Area(s) Addressed: Patient will practice using relaxation technique. Patient will identify a new coping skill.  Patient will follow multistep directions to reduce anxiety and stress.   Affect/Mood: N/A   Participation Level: Did not attend    Clinical Observations/Individualized Feedback: Patient did not attend group.   Plan: Continue to engage patient in RT group sessions 2-3x/week.   [address], LRT, CTRS 06/04/2023 1:54 PM

## 2023-06-04 NOTE — Progress Notes (Signed)
 [facility] MD Progress Note  06/04/2023 12:26 PM Natalie Edwards  MRN:  [id number]  Natalie Edwards is a 58 y.o. female admitted: Presented to the [facility] 05/22/2023  6:47 PM for cocaine overdose. She carries the psychiatric diagnoses of Major depressive disorder, recurrent without psychotic features, Cocaine abuse and previous suicide attempt, and has a past medical history of HTN, GERD. Her current presentation of recent cocaine overdose with past history of MDD and suicide attempts is most consistent with major depressive disorder, recurrent vs cocaine induced mood disorder. She meets criteria for inpatient psychiatric admission based on recent cocaine overdose and risk of harm to self.  Patient is admitted to the geropsychiatry unit for further stabilization   Subjective:  Chart reviewed, case discussed in multidisciplinary meeting, patient seen during rounds.  Per staff report patient is doing fine on the unit.  No new events reported overnight.  Social worker had faxed the paperwork to rehab facility for them to review for the acceptance of the patient.  Patient reports she is hopeful to get a bed at a rehab facility.  She reports her sleep and appetite has improved.  She reports she has been attending groups and working on coping strategies. She denies any SI/HI/intent/plan.  She denies auditory/visual hallucinations.  Sleep: Fair  Appetite:  Fair  Past Psychiatric History: see h&P Family History:  Family History  Problem Relation Age of Onset   Cancer Maternal Uncle        Lung   Cancer Maternal Uncle        Lung   Cancer Maternal Uncle        Lung   Headache Neg Hx        "I don't think so"   Migraines Neg Hx        "I don't think so"   Social History:  Social History   Substance and Sexual Activity  Alcohol Use Yes   Comment: occasionally - last drink was July 4, 1 shot     Social History   Substance and Sexual Activity  Drug Use No    Social History   Socioeconomic History    Marital status: Single    Spouse name: Not on file   Number of children: 3   Years of education: Not on file   Highest education level: 12th grade  Occupational History   Occupation: environmental services at [employer]  Tobacco Use   Smoking status: Every Day    Current packs/day: 0.50    Average packs/day: 0.5 packs/day for 25.0 years (12.5 ttl pk-yrs)    Types: Cigarettes   Smokeless tobacco: Never   Tobacco comments:    Pt tried to quit - helps with anxiety     1 pack last patient 3 days   Vaping Use   Vaping status: Never Used  Substance and Sexual Activity   Alcohol use: Yes    Comment: occasionally - last drink was July 4, 1 shot   Drug use: No   Sexual activity: Yes    Comment: hysterectomy  Other Topics Concern   Not on file  Social History Narrative   Lives at home in an apartment. Her mother lives with her.    Right handed   Caffeine: drinks approx. 36 oz of pepsi per day. Sometimes drinks 2 cups of coffee in a day as well.    Social Drivers of Health   Financial Resource Strain: Low Risk  (03/06/2023)   Overall Financial Resource Strain (CARDIA)  Difficulty of Paying Living Expenses: Not very hard  Food Insecurity: Patient Declined (05/26/2023)   Hunger Vital Sign    Worried About Running Out of Food in the Last Year: Patient declined    Ran Out of Food in the Last Year: Patient declined  Recent Concern: Food Insecurity - Food Insecurity Present (05/24/2023)   Hunger Vital Sign    Worried About Running Out of Food in the Last Year: Sometimes true    Ran Out of Food in the Last Year: Often true  Transportation Needs: No Transportation Needs (05/26/2023)   PRAPARE - Administrator, Civil Service (Medical): No    Lack of Transportation (Non-Medical): No  Recent Concern: Transportation Needs - Unmet Transportation Needs (05/24/2023)   PRAPARE - Administrator, Civil Service (Medical): Yes    Lack of Transportation (Non-Medical): No   Physical Activity: Sufficiently Active (05/22/2022)   Exercise Vital Sign    Days of Exercise per Week: 4 days    Minutes of Exercise per Session: 70 min  Stress: Stress Concern Present (03/06/2023)   [employer] of Occupational Health - Occupational Stress Questionnaire    Feeling of Stress : Very much  Social Connections: Socially Isolated (05/26/2023)   Social Connection and Isolation Panel [NHANES]    Frequency of Communication with Friends and Family: More than three times a week    Frequency of Social Gatherings with Friends and Family: More than three times a week    Attends Religious Services: Never    Database administrator or Organizations: No    Attends Engineer, structural: Never    Marital Status: Divorced   Past Medical History:  Past Medical History:  Diagnosis Date   Allergy    Anemia    Diverticulosis    Gall bladder stones 1993   Gastric ulcer    GERD (gastroesophageal reflux disease)    Hypertension    Renal mass     Past Surgical History:  Procedure Laterality Date   ABDOMINAL HYSTERECTOMY     BIOPSY  09/29/2022   Procedure: BIOPSY;  Surgeon: Napoleon Form, MD;  Location: [facility];  Service: Gastroenterology;;   CHOLECYSTECTOMY     COLONOSCOPY WITH ESOPHAGOGASTRODUODENOSCOPY (EGD)  04/07/2023   [facility]   ESOPHAGOGASTRODUODENOSCOPY (EGD) WITH PROPOFOL N/A 09/29/2022   Procedure: ESOPHAGOGASTRODUODENOSCOPY (EGD) WITH PROPOFOL;  Surgeon: Napoleon Form, MD;  Location: [facility];  Service: Gastroenterology;  Laterality: N/A;    Current Medications: Current Facility-Administered Medications  Medication Dose Route Frequency Provider Last Rate Last Admin   acetaminophen (TYLENOL) tablet 650 mg  650 mg Oral Q6H PRN Maryagnes Amos, FNP   650 mg at 06/02/23 1650   albuterol (PROVENTIL) (2.5 MG/3ML) 0.083% nebulizer solution 2.5 mg  2.5 mg Nebulization Q2H PRN Starkes-Perry, Juel Burrow, FNP       alum & mag  hydroxide-simeth (MAALOX/MYLANTA) 200-200-20 MG/5ML suspension 30 mL  30 mL Oral Q4H PRN Starkes-Perry, Juel Burrow, FNP       FLUoxetine (PROZAC) capsule 20 mg  20 mg Oral Daily Maryagnes Amos, FNP   20 mg at 06/04/23 0856   hydrochlorothiazide (HYDRODIURIL) tablet 25 mg  25 mg Oral Daily Delfino Lovett, MD   25 mg at 06/04/23 0856   magnesium hydroxide (MILK OF MAGNESIA) suspension 30 mL  30 mL Oral Daily PRN Maryagnes Amos, FNP       mirtazapine (REMERON) tablet 30 mg  30 mg Oral QHS Starkes-Perry,  Juel Burrow, FNP   30 mg at 06/03/23 2136   naphazoline-glycerin (CLEAR EYES REDNESS) ophth solution 1-2 drop  1-2 drop Both Eyes QID PRN Starkes-Perry, Juel Burrow, FNP       nicotine (NICODERM CQ - dosed in mg/24 hours) patch 21 mg  21 mg Transdermal Daily Verner Chol, MD   21 mg at 06/04/23 0855   OLANZapine (ZYPREXA) injection 5 mg  5 mg Intramuscular TID PRN Starkes-Perry, Juel Burrow, FNP       OLANZapine zydis (ZYPREXA) disintegrating tablet 5 mg  5 mg Oral TID PRN Maryagnes Amos, FNP       ondansetron (ZOFRAN) tablet 4 mg  4 mg Oral Q6H PRN Maryagnes Amos, FNP       Or   ondansetron (ZOFRAN) injection 4 mg  4 mg Intravenous Q6H PRN Starkes-Perry, Juel Burrow, FNP       pantoprazole (PROTONIX) EC tablet 40 mg  40 mg Oral Daily Maryagnes Amos, FNP   40 mg at 06/04/23 0856   risperiDONE (RISPERDAL) tablet 0.5 mg  0.5 mg Oral Daily Maryagnes Amos, FNP   0.5 mg at 06/04/23 0856   traMADol (ULTRAM) tablet 50 mg  50 mg Oral Q8H PRN Verner Chol, MD   50 mg at 06/04/23 0856   traZODone (DESYREL) tablet 50 mg  50 mg Oral QHS Verner Chol, MD   50 mg at 06/03/23 2136     Blood Alcohol level:  Lab Results  Component Value Date   ETH <10 05/22/2023   ETH <10 12/22/2022    Metabolic Disorder Labs: Lab Results  Component Value Date   HGBA1C 5.2 05/22/2023   MPG 102.54 05/22/2023   No results found for: "PROLACTIN" Lab Results  Component Value Date   CHOL  183 05/29/2023   TRIG 87 05/29/2023   HDL 63 05/29/2023   CHOLHDL 2.9 05/29/2023   VLDL 17 05/29/2023   LDLCALC 103 (H) 05/29/2023   LDLCALC 106 (H) 12/13/2021      Psychiatric Specialty Exam:  Presentation  General Appearance:  Appropriate for Environment; Casual  Eye Contact: Fair  Speech: Clear and Coherent  Speech Volume: Normal    Mood and Affect  Mood: "Fine"  Affect: Stable and animated   Thought Process  Thought Processes: Coherent  Descriptions of Associations:Intact  Orientation:Full (Time, Place and Person)  Thought Content:Logical  Hallucinations: Denies  Ideas of Reference:None  Suicidal Thoughts: Denies  Homicidal Thoughts: Denies Sensorium  Memory: Immediate Fair; Recent Fair; Remote Fair  Judgment: Improving  Insight: Improved   Executive Functions  Concentration: Fair  Attention Span: Fair  Recall: [employer] of Knowledge: Fair  Language: Fair   Psychomotor Activity  Psychomotor Activity: Normal  Musculoskeletal: Strength & Muscle Tone: within normal limits Gait & Station: normal Assets  Assets: Manufacturing systems engineer; Desire for Improvement; Physical Health    Physical Exam: Physical Exam Vitals and nursing note reviewed.  HENT:     Head: Normocephalic.     Nose: Nose normal.     Mouth/Throat:     Mouth: Mucous membranes are moist.  Cardiovascular:     Rate and Rhythm: Normal rate.     Pulses: Normal pulses.  Pulmonary:     Effort: Pulmonary effort is normal.  Abdominal:     General: Bowel sounds are normal.  Skin:    General: Skin is warm.  Neurological:     General: No focal deficit present.     Mental Status: She is alert.  Review of Systems  Constitutional: Negative.   HENT: Negative.    Eyes: Negative.   Respiratory: Negative.    Cardiovascular: Negative.   Gastrointestinal: Negative.   Genitourinary: Negative.   Neurological: Negative.    Blood pressure 118/66, pulse  93, temperature 98.2 F (36.8 C), resp. rate 18, height 5\' 4"  (1.626 m), weight 82.8 kg, SpO2 97%. Body mass index is 31.33 kg/m.  Diagnosis: Principal Problem:   MDD (major depressive disorder), recurrent episode, severe (HCC) Active Problems:   Essential hypertension   MDD (major depressive disorder), recurrent severe, without psychosis (HCC)   Ingestion of substance, intentional self-harm, sequela (HCC)   Cocaine abuse (HCC)   Pain and swelling of lower extremity   Treatment Plan Summary:   Safety and Monitoring:             -- Involuntary admission to inpatient psychiatric unit for safety, stabilization and treatment             -- Daily contact with patient to assess and evaluate symptoms and progress in treatment             -- Patient's case to be discussed in multi-disciplinary team meeting             -- Observation Level: q15 minute checks             -- Vital signs:  q12 hours             -- Precautions: suicide, elopement, and assault   2. Psychiatric Diagnoses and Treatment:               In the ED got restarted on her Prozac 20 mg daily, Remeron 30 mg nightly Risperdal 0.5 mg daily, added trazodone 50 mg nightly for insomnia   -- The risks/benefits/side-effects/alternatives to this medication were discussed in detail with the patient and time was given for questions. The patient consents to medication trial.                -- Metabolic profile and EKG monitoring obtained while on an atypical antipsychotic (BMI: Lipid Panel: HbgA1c: QTc:)              -- Encouraged patient to participate in unit milieu and in scheduled group therapies                            3. Medical Issues Being Addressed:   None today, Patient has successfully completed her detox.  There are no medical concerns for the patient to be discharged to her rehab facility   4. Discharge Planning:              -- Social work and case management to assist with discharge planning and identification of  hospital follow-up needs prior to discharge             -- Estimated LOS: 1-2 days             -- Discharge Concerns: Need to establish a safety plan; Medication compliance and effectiveness                Physician Treatment Plan for Primary Diagnosis: MDD (major depressive disorder), recurrent episode, severe (HCC) Long Term Goal(s): Improvement in symptoms so as ready for discharge   Short Term Goals: Ability to identify changes in lifestyle to reduce recurrence of condition will improve, Ability to verbalize feelings will improve, Ability to disclose and discuss suicidal ideas,  Ability to demonstrate self-control will improve, and Ability to identify and develop effective coping behaviors will improve     Lewanda Rife, MD

## 2023-06-04 NOTE — Plan of Care (Signed)
  Problem: Activity: Goal: Interest or engagement in leisure activities will improve Outcome: Progressing Goal: Imbalance in normal sleep/wake cycle will improve Outcome: Progressing   Problem: Coping: Goal: Coping ability will improve Outcome: Progressing Goal: Will verbalize feelings Outcome: Progressing   

## 2023-06-04 NOTE — Group Note (Signed)
 Recreation Therapy Group Note   Group Topic:Communication  Group Date: 06/04/2023 Start Time: 1445 End Time: 1535 Facilitators: Rosina Lowenstein, LRT, CTRS Location: Courtyard  Group Description: Emotional Check in. Patient sat and talked with LRT about how they are doing and whatever else is on their mind. LRT provided active listening, reassurance and encouragement. Pts were given the opportunity to listen to music or color mandalas while they talk.    Goal Area(s) Addressed: Patient will engage in conversation with LRT. Patient will communicate their wants, needs, or questions.  Patient will practice a new coping skill of "talking to someone".   Affect/Mood: N/A   Participation Level: Did not attend    Clinical Observations/Individualized Feedback: Patient did not attend group.   Plan: Continue to engage patient in RT group sessions 2-3x/week.   Rosina Lowenstein, LRT, CTRS 06/04/2023 5:09 PM

## 2023-06-04 NOTE — BHH Counselor (Signed)
 CSW received email from [facility] that pt has completed her screening and that pt's bed would be available if provider can state that pt has completed medical detox.    CSW sent medical detox to [facility] per their request  Reynaldo Minium, MSW, [facility] 06/04/2023 2:48 PM

## 2023-06-04 NOTE — BHH Counselor (Signed)
 CSW contacted [facility] to see when pt's bed could be scheduled.   They report they are still reviewing the paperwork and to call back tomorrow.   CSW will contact tomorrow.    Reynaldo Minium, MSW, [place] 06/04/2023 2:52 PM

## 2023-06-04 NOTE — Progress Notes (Signed)
 Patient is pleasant and cooperative.  Flat affect.  Denies SI/HI and AVH.  Denies anxiety and depression.  Reports she slept well.  Pain rated 9/10 in feet.   Compliant with scheduled medications. PRN pain medication given.  15 min checks in place for safety.  Patient is present in the milieu.  Appropriate interaction with staff and peers.

## 2023-06-05 NOTE — Progress Notes (Signed)
   06/04/23 2000  Psych Admission Type (Psych Patients Only)  Admission Status Voluntary  Psychosocial Assessment  Patient Complaints None  Eye Contact Fair  Facial Expression Flat  Affect Appropriate to circumstance  Speech Logical/coherent  Interaction Assertive  Motor Activity Slow  Appearance/Hygiene In scrubs  Behavior Characteristics Appropriate to situation  Mood Pleasant  Thought Process  Coherency WDL  Content WDL  Delusions None reported or observed  Perception WDL  Hallucination None reported or observed  Judgment Impaired  Confusion None  Danger to Self  Current suicidal ideation? Denies   Pt alert and oriented. Calm and cooperative. States she's okay. Showered. Visible on the unit. Did not attend group. C/o leg and foot pain. B/l LE edema. +1 pitting edema. Legs elevated on pillow for discomfort. PRN given pain/discomfort. Q 15 min checks provided. Denies SI/HI/AVH. Pt  remains on the unit.

## 2023-06-05 NOTE — Progress Notes (Signed)
 [facility] MD Progress Note  06/05/2023 10:14 PM Natalie Edwards  MRN:  [id number]  Natalie Edwards is a 58 y.o. female admitted: Presented to the [facility] 05/22/2023  6:47 PM for cocaine overdose. She carries the psychiatric diagnoses of Major depressive disorder, recurrent without psychotic features, Cocaine abuse and previous suicide attempt, and has a past medical history of HTN, GERD. Her current presentation of recent cocaine overdose with past history of MDD and suicide attempts is most consistent with major depressive disorder, recurrent vs cocaine induced mood disorder. She meets criteria for inpatient psychiatric admission based on recent cocaine overdose and risk of harm to self.  Patient is admitted to the geropsychiatry unit for further stabilization   Subjective:  Chart reviewed, case discussed in multidisciplinary meeting, patient seen during rounds.  Per staff report patient is doing fine on the unit.  No new events reported overnight. Patient is focussed on her discharge. Patient was informed that the  Social worker had faxed the paperwork to rehab facility for them to review for the acceptance of the patient.   She reports her sleep and appetite has improved.  She reports she has been attending groups and working on coping strategies. She denies any SI/HI/intent/plan.  She denies auditory/visual hallucinations.  Sleep: Fair  Appetite:  Fair  Past Psychiatric History: see h&P Family History:  Family History  Problem Relation Age of Onset   Cancer Maternal Uncle        Lung   Cancer Maternal Uncle        Lung   Cancer Maternal Uncle        Lung   Headache Neg Hx        "I don't think so"   Migraines Neg Hx        "I don't think so"   Social History:  Social History   Substance and Sexual Activity  Alcohol Use Yes   Comment: occasionally - last drink was July 4, 1 shot     Social History   Substance and Sexual Activity  Drug Use No    Social History   Socioeconomic History    Marital status: Single    Spouse name: Not on file   Number of children: 3   Years of education: Not on file   Highest education level: 12th grade  Occupational History   Occupation: environmental services at [employer]  Tobacco Use   Smoking status: Every Day    Current packs/day: 0.50    Average packs/day: 0.5 packs/day for 25.0 years (12.5 ttl pk-yrs)    Types: Cigarettes   Smokeless tobacco: Never   Tobacco comments:    Pt tried to quit - helps with anxiety     1 pack last patient 3 days   Vaping Use   Vaping status: Never Used  Substance and Sexual Activity   Alcohol use: Yes    Comment: occasionally - last drink was July 4, 1 shot   Drug use: No   Sexual activity: Yes    Comment: hysterectomy  Other Topics Concern   Not on file  Social History Narrative   Lives at home in an apartment. Her mother lives with her.    Right handed   Caffeine: drinks approx. 36 oz of pepsi per day. Sometimes drinks 2 cups of coffee in a day as well.    Social Drivers of Health   Financial Resource Strain: Low Risk  (03/06/2023)   Overall Financial Resource Strain (CARDIA)    Difficulty of  Paying Living Expenses: Not very hard  Food Insecurity: Patient Declined (05/26/2023)   Hunger Vital Sign    Worried About Running Out of Food in the Last Year: Patient declined    Ran Out of Food in the Last Year: Patient declined  Recent Concern: Food Insecurity - Food Insecurity Present (05/24/2023)   Hunger Vital Sign    Worried About Running Out of Food in the Last Year: Sometimes true    Ran Out of Food in the Last Year: Often true  Transportation Needs: No Transportation Needs (05/26/2023)   PRAPARE - Administrator, Civil Service (Medical): No    Lack of Transportation (Non-Medical): No  Recent Concern: Transportation Needs - Unmet Transportation Needs (05/24/2023)   PRAPARE - Administrator, Civil Service (Medical): Yes    Lack of Transportation (Non-Medical): No   Physical Activity: Sufficiently Active (05/22/2022)   Exercise Vital Sign    Days of Exercise per Week: 4 days    Minutes of Exercise per Session: 70 min  Stress: Stress Concern Present (03/06/2023)   [employer] of Occupational Health - Occupational Stress Questionnaire    Feeling of Stress : Very much  Social Connections: Socially Isolated (05/26/2023)   Social Connection and Isolation Panel [NHANES]    Frequency of Communication with Friends and Family: More than three times a week    Frequency of Social Gatherings with Friends and Family: More than three times a week    Attends Religious Services: Never    Database administrator or Organizations: No    Attends Engineer, structural: Never    Marital Status: Divorced   Past Medical History:  Past Medical History:  Diagnosis Date   Allergy    Anemia    Diverticulosis    Gall bladder stones 1993   Gastric ulcer    GERD (gastroesophageal reflux disease)    Hypertension    Renal mass     Past Surgical History:  Procedure Laterality Date   ABDOMINAL HYSTERECTOMY     BIOPSY  09/29/2022   Procedure: BIOPSY;  Surgeon: Napoleon Form, MD;  Location: [facility];  Service: Gastroenterology;;   CHOLECYSTECTOMY     COLONOSCOPY WITH ESOPHAGOGASTRODUODENOSCOPY (EGD)  04/07/2023   [facility]   ESOPHAGOGASTRODUODENOSCOPY (EGD) WITH PROPOFOL N/A 09/29/2022   Procedure: ESOPHAGOGASTRODUODENOSCOPY (EGD) WITH PROPOFOL;  Surgeon: Napoleon Form, MD;  Location: [facility];  Service: Gastroenterology;  Laterality: N/A;    Current Medications: Current Facility-Administered Medications  Medication Dose Route Frequency Provider Last Rate Last Admin   acetaminophen (TYLENOL) tablet 650 mg  650 mg Oral Q6H PRN Maryagnes Amos, FNP   650 mg at 06/04/23 1734   albuterol (PROVENTIL) (2.5 MG/3ML) 0.083% nebulizer solution 2.5 mg  2.5 mg Nebulization Q2H PRN Starkes-Perry, Juel Burrow, FNP       alum & mag  hydroxide-simeth (MAALOX/MYLANTA) 200-200-20 MG/5ML suspension 30 mL  30 mL Oral Q4H PRN Starkes-Perry, Juel Burrow, FNP       FLUoxetine (PROZAC) capsule 20 mg  20 mg Oral Daily Maryagnes Amos, FNP   20 mg at 06/05/23 0981   hydrochlorothiazide (HYDRODIURIL) tablet 25 mg  25 mg Oral Daily Delfino Lovett, MD   25 mg at 06/05/23 1914   magnesium hydroxide (MILK OF MAGNESIA) suspension 30 mL  30 mL Oral Daily PRN Maryagnes Amos, FNP       mirtazapine (REMERON) tablet 30 mg  30 mg Oral QHS Starkes-Perry, Juel Burrow,  FNP   30 mg at 06/05/23 2118   naphazoline-glycerin (CLEAR EYES REDNESS) ophth solution 1-2 drop  1-2 drop Both Eyes QID PRN Starkes-Perry, Juel Burrow, FNP       nicotine (NICODERM CQ - dosed in mg/24 hours) patch 21 mg  21 mg Transdermal Daily Verner Chol, MD   21 mg at 06/05/23 0807   OLANZapine (ZYPREXA) injection 5 mg  5 mg Intramuscular TID PRN Starkes-Perry, Juel Burrow, FNP       OLANZapine zydis (ZYPREXA) disintegrating tablet 5 mg  5 mg Oral TID PRN Maryagnes Amos, FNP       ondansetron (ZOFRAN) tablet 4 mg  4 mg Oral Q6H PRN Maryagnes Amos, FNP       Or   ondansetron (ZOFRAN) injection 4 mg  4 mg Intravenous Q6H PRN Starkes-Perry, Juel Burrow, FNP       pantoprazole (PROTONIX) EC tablet 40 mg  40 mg Oral Daily Maryagnes Amos, FNP   40 mg at 06/05/23 1610   risperiDONE (RISPERDAL) tablet 0.5 mg  0.5 mg Oral Daily Maryagnes Amos, FNP   0.5 mg at 06/05/23 9604   traMADol (ULTRAM) tablet 50 mg  50 mg Oral Q8H PRN Verner Chol, MD   50 mg at 06/05/23 2135   traZODone (DESYREL) tablet 50 mg  50 mg Oral QHS Verner Chol, MD   50 mg at 06/05/23 2118     Blood Alcohol level:  Lab Results  Component Value Date   ETH <10 05/22/2023   ETH <10 12/22/2022    Metabolic Disorder Labs: Lab Results  Component Value Date   HGBA1C 5.2 05/22/2023   MPG 102.54 05/22/2023   No results found for: "PROLACTIN" Lab Results  Component Value Date   CHOL  183 05/29/2023   TRIG 87 05/29/2023   HDL 63 05/29/2023   CHOLHDL 2.9 05/29/2023   VLDL 17 05/29/2023   LDLCALC 103 (H) 05/29/2023   LDLCALC 106 (H) 12/13/2021      Psychiatric Specialty Exam:  Presentation  General Appearance:  Appropriate for Environment; Casual  Eye Contact: Fair  Speech: Clear and Coherent  Speech Volume: Normal    Mood and Affect  Mood: "Fine"  Affect: Stable and animated   Thought Process  Thought Processes: Coherent  Descriptions of Associations:Intact  Orientation:Full (Time, Place and Person)  Thought Content:Logical  Hallucinations: Denies  Ideas of Reference:None  Suicidal Thoughts: Denies  Homicidal Thoughts: Denies Sensorium  Memory: Immediate Fair; Recent Fair; Remote Fair  Judgment: Improving  Insight: Improved   Executive Functions  Concentration: Fair  Attention Span: Fair  Recall: [employer] of Knowledge: Fair  Language: Fair   Psychomotor Activity  Psychomotor Activity: Normal  Musculoskeletal: Strength & Muscle Tone: within normal limits Gait & Station: normal Assets  Assets: Manufacturing systems engineer; Desire for Improvement; Physical Health    Physical Exam: Physical Exam Vitals and nursing note reviewed.  HENT:     Head: Normocephalic.     Nose: Nose normal.     Mouth/Throat:     Mouth: Mucous membranes are moist.  Cardiovascular:     Rate and Rhythm: Normal rate.     Pulses: Normal pulses.  Pulmonary:     Effort: Pulmonary effort is normal.  Abdominal:     General: Bowel sounds are normal.  Skin:    General: Skin is warm.  Neurological:     General: No focal deficit present.     Mental Status: She is alert.  Review of Systems  Constitutional: Negative.   HENT: Negative.    Eyes: Negative.   Respiratory: Negative.    Cardiovascular: Negative.   Gastrointestinal: Negative.   Genitourinary: Negative.   Neurological: Negative.    Blood pressure 126/74, pulse  93, temperature 98.6 F (37 C), resp. rate 16, height 5\' 4"  (1.626 m), weight 82.8 kg, SpO2 97%. Body mass index is 31.33 kg/m.  Diagnosis: Principal Problem:   MDD (major depressive disorder), recurrent episode, severe (HCC) Active Problems:   Essential hypertension   MDD (major depressive disorder), recurrent severe, without psychosis (HCC)   Ingestion of substance, intentional self-harm, sequela (HCC)   Cocaine abuse (HCC)   Pain and swelling of lower extremity   Treatment Plan Summary:   Safety and Monitoring:             -- Involuntary admission to inpatient psychiatric unit for safety, stabilization and treatment             -- Daily contact with patient to assess and evaluate symptoms and progress in treatment             -- Patient's case to be discussed in multi-disciplinary team meeting             -- Observation Level: q15 minute checks             -- Vital signs:  q12 hours             -- Precautions: suicide, elopement, and assault   2. Psychiatric Diagnoses and Treatment:               In the ED got restarted on her Prozac 20 mg daily, Remeron 30 mg nightly Risperdal 0.5 mg daily, added trazodone 50 mg nightly for insomnia   -- The risks/benefits/side-effects/alternatives to this medication were discussed in detail with the patient and time was given for questions. The patient consents to medication trial.                          -- Encouraged patient to participate in unit milieu and in scheduled group therapies                            3. Medical Issues Being Addressed:   None today, Patient has successfully completed her detox.  There are no medical concerns for the patient to be discharged to her rehab facility   4. Discharge Planning:              -- Social work and case management to assist with discharge planning and identification of hospital follow-up needs prior to discharge             -- Estimated LOS: 1-2 days             -- Discharge Concerns:  Need to establish a safety plan; Medication compliance and effectiveness                Physician Treatment Plan for Primary Diagnosis: MDD (major depressive disorder), recurrent episode, severe (HCC) Long Term Goal(s): Improvement in symptoms so as ready for discharge   Short Term Goals: Ability to identify changes in lifestyle to reduce recurrence of condition will improve, Ability to verbalize feelings will improve, Ability to disclose and discuss suicidal ideas, Ability to demonstrate self-control will improve, and Ability to identify and develop effective coping behaviors will improve  Lewanda Rife, MD

## 2023-06-05 NOTE — Group Note (Signed)
 Date:  06/05/2023 Time:  4:59 AM  Group Topic/Focus:  Goals Group:   The focus of this group is to help patients establish daily goals to achieve during treatment and discuss how the patient can incorporate goal setting into their daily lives to aide in recovery.    Participation Level:  Active  Participation Quality:  Appropriate  Affect:  Appropriate  Cognitive:  Appropriate  Insight: Good  Engagement in Group:  Engaged  Modes of Intervention:  Discussion  Additional Comments:    Burt Ek 06/05/2023, 4:59 AM

## 2023-06-05 NOTE — Plan of Care (Signed)
 D: Pt alert and oriented. Pt denies experiencing any anxiety/depression at this time. Pt reports experiencing 8/10 bilat leg pain, prn medication given and found helpful. Pt denies experiencing any SI/HI, or AVH at this time.   A: Scheduled medications administered to pt, per MD orders. Support and encouragement provided. Frequent verbal contact made. Routine safety checks conducted q15 minutes.   R: No adverse drug reactions noted. Pt verbally contracts for safety at this time. Pt compliant with medications and treatment plan. Pt interacts well with others on the unit. Pt remains safe at this time. Plan of care ongoing.  Problem: Coping: Goal: Coping ability will improve Outcome: Progressing Goal: Will verbalize feelings Outcome: Progressing

## 2023-06-05 NOTE — Group Note (Signed)
 Recreation Therapy Group Note   Group Topic:Emotion Expression  Group Date: 06/05/2023 Start Time: 1500 End Time: 1600 Facilitators: Rosina Lowenstein, LRT, CTRS Location: Courtyard  Group Description: Merchant navy officer. Patients drew a laminated card out of a bag that had a word or phrase on it. Pt encouraged to speak about a time in their life or fond memory that specifically relates to the word they chose out of the bag. An example would be: "parenthood, meals, siblings, travel, or home".  LRT prompted following questions and encouraged contribution from peers to increase communication.   Goal Area(s) Addressed: Patient will increase verbal communication by conversing with peers. Patient will contribute to group discussion with minimal prompting. Patient will reminisce a positive memory or moment in their life.   Affect/Mood: N/A   Participation Level: Did not attend    Clinical Observations/Individualized Feedback: Patient did not attend group.   Plan: Continue to engage patient in RT group sessions 2-3x/week.   Rosina Lowenstein, LRT, CTRS 06/05/2023 5:38 PM

## 2023-06-05 NOTE — B[facility] Counselor (Signed)
 CSW spoke with patient and obtained permission to speak with [facility] to confirm bed.  CSW called and spoke with staff who report that patient needs to arrive by 5PM to get a winter shelter bed.  They report that pt will have to be out of the shelter by 8AM on the weekdays.  On weekdays, once winter shelter is closed patient can go across the street to [facility].  On weekends, individuals are not required to leave during the day.  Staff reports that the last winter shelter bed day is March 31st.   Penni Homans, MSW, LCSW 06/05/2023 2:41 PM

## 2023-06-05 NOTE — Plan of Care (Signed)

## 2023-06-05 NOTE — B[facility] Counselor (Signed)
 CSW spoke with [name] at patient's request.  [name] and patient were under impression that patient had a 31 day bed at [facility].  CSW called [facility] and spoke with Andreas Ohm and Lequita Halt who confirmed that patient does NOT have a bed, the available beds were filled earlier.    CSW conference in [place] who was informed the above.   CSW spoke with patient and informed her that bed was not available, but winter bed was as discussed in previous note.  Patient decided to remain on unit and wait for possible [facility] bed.  Penni Homans, MSW, LCSW 06/05/2023 3:17 PM

## 2023-06-05 NOTE — Group Note (Signed)
 Date:  06/05/2023 Time:  6:05 PM  Group Topic/Focus:  Orientation:   The focus of this group is to educate the patient on the purpose and policies of crisis stabilization and provide a format to answer questions about their admission.  The group details unit policies and expectations of patients while admitted. Rediscovering Joy:   The focus of this group is to explore various ways to relieve stress in a positive manner.    Participation Level:  Active  Participation Quality:  Appropriate  Affect:  Appropriate  Cognitive:  Alert, Appropriate, and Oriented  Insight: Appropriate, Good, and Improving  Engagement in Group:  Engaged and Supportive  Modes of Intervention:  Orientation, Socialization, and Support  Additional Comments:     Alexis Frock 06/05/2023, 6:05 PM

## 2023-06-05 NOTE — Group Note (Signed)
 Therapy Group Note  Group Topic: Self Care Safety with Equipment Group Date: 06/05/2023 Start Time: 1300 End Time: 1335 Facilitators: Lottie Mussel, OT     Group Description: Group educated on safety considerations/modifications with bathing, dressing and ADL routines to maximize independence and minimize fall risk with tasks.  Reviewed and demonstrated use of shower chair and tub transfer bench as appropriate.  Reviewed and demonstrated use of adaptive equipment (sock aide, reacher, long-handled sponge) available for ADL tasks.  Reviewed and demonstrated role of activity pacing and energy conservation with functional activities.  Provided handout with visual reference of available equipment.    Therapeutic Goal(s): Verbalize and demonstrate safe technique for tub/shower transfers with DME/adaptive equipment as needed. Verbalize and demonstrate appropriate use of adaptive equipment with bathing, dressing and ADL routine. Verbalize and demonstrate appropriate use of activity pacing and energy conservation with bathing, dressing and ADL routine.  Individual Participation:  Pt did not attend.    Participation Level: Did not attend   Participation Quality:   Behavior:   Speech/Thought Process:   Affect/Mood:   Insight:   Judgement:   Individualization:   Modes of Intervention:   Patient Response to Interventions:    Plan: Continue to engage patient in OT groups 1-2x/week.  Natalie Edwards., MPH, MS, OTR/L ascom [phone] 06/05/23, 2:31 PM

## 2023-06-05 NOTE — Plan of Care (Signed)
  Problem: Education: Goal: Utilization of techniques to improve thought processes will improve Outcome: Progressing Goal: Knowledge of the prescribed therapeutic regimen will improve Outcome: Progressing   Problem: Activity: Goal: Interest or engagement in leisure activities will improve Outcome: Progressing Goal: Imbalance in normal sleep/wake cycle will improve Outcome: Progressing   

## 2023-06-05 NOTE — Group Note (Signed)
 Date:  06/05/2023 Time:  8:45 PM  Group Topic/Focus:  Goals Group:   The focus of this group is to help patients establish daily goals to achieve during treatment and discuss how the patient can incorporate goal setting into their daily lives to aide in recovery.    Participation Level:  Active  Participation Quality:  Appropriate  Affect:  Appropriate  Cognitive:  Appropriate  Insight: Appropriate  Engagement in Group:  Engaged  Modes of Intervention:  Activity  Additional Comments:    Natalie Edwards 06/05/2023, 8:45 PM

## 2023-06-05 NOTE — [facility] Counselor (Signed)
 CSW contacted [facility] to check on status of referral.  Per staff at [facility], still waiting on approval.  CSW spoke with Konrad Felix, she reports that patient should be reviewed by tomorrow.   Penni Homans, MSW, LCSW 06/05/2023 2:33 PM

## 2023-06-06 NOTE — Plan of Care (Signed)
 D: Pt alert and oriented. Pt denies experiencing any anxiety/depression at this time. Pt reports experiencing chronic bilat foot pain, prn medication given. Pt denies experiencing any SI/HI, or AVH at this time.   A: Scheduled medications administered to pt, per MD orders. Support and encouragement provided. Frequent verbal contact made. Routine safety checks conducted q15 minutes.   R: No adverse drug reactions noted. Pt verbally contracts for safety at this time. Pt compliant with medications and treatment plan. Pt interacts well with others on the unit. Pt remains safe at this time. Plan of care ongoing.  Problem: Activity: Goal: Interest or engagement in leisure activities will improve Outcome: Progressing   Problem: Coping: Goal: Will verbalize feelings Outcome: Progressing

## 2023-06-06 NOTE — Progress Notes (Signed)
 [facility] MD Progress Note  06/06/2023  Natalie Edwards  MRN:  [id number]  Natalie Edwards is a 58 y.o. female admitted: Presented to the [facility] 05/22/2023  6:47 PM for cocaine overdose. She carries the psychiatric diagnoses of Major depressive disorder, recurrent without psychotic features, Cocaine abuse and previous suicide attempt, and has a past medical history of HTN, GERD. Her current presentation of recent cocaine overdose with past history of MDD and suicide attempts is most consistent with major depressive disorder, recurrent vs cocaine induced mood disorder. She meets criteria for inpatient psychiatric admission based on recent cocaine overdose and risk of harm to self.  Patient is admitted to the geropsychiatry unit for further stabilization   Subjective:  Chart reviewed, case discussed in multidisciplinary meeting, patient seen during rounds.  Per staff report patient is doing fine on the unit.   CSW contacted [facility] admissions to check on status of pt's bed.  They report there is no one available on the weekend who could approve of the status of pt's bed and that CSW will have to contact them on Monday.   On assessment today patient endorsed fine mood.  Patient was informed that Burnett Harry will left social worker no the availability of bed this Monday.  No new events reported overnight.  Patient reports her sleep and appetite has improved.  She reports she has been attending groups and working on coping strategies. She denies any SI/HI/intent/plan.  She denies auditory/visual hallucinations.  Sleep: Fair  Appetite:  Fair  Past Psychiatric History: see h&P Family History:  Family History  Problem Relation Age of Onset   Cancer Maternal Uncle        Lung   Cancer Maternal Uncle        Lung   Cancer Maternal Uncle        Lung   Headache Neg Hx        "I don't think so"   Migraines Neg Hx        "I don't think so"   Social History:  Social History   Substance and Sexual Activity  Alcohol Use  Yes   Comment: occasionally - last drink was July 4, 1 shot     Social History   Substance and Sexual Activity  Drug Use No    Social History   Socioeconomic History   Marital status: Single    Spouse name: Not on file   Number of children: 3   Years of education: Not on file   Highest education level: 12th grade  Occupational History   Occupation: environmental services at [employer]  Tobacco Use   Smoking status: Every Day    Current packs/day: 0.50    Average packs/day: 0.5 packs/day for 25.0 years (12.5 ttl pk-yrs)    Types: Cigarettes   Smokeless tobacco: Never   Tobacco comments:    Pt tried to quit - helps with anxiety     1 pack last patient 3 days   Vaping Use   Vaping status: Never Used  Substance and Sexual Activity   Alcohol use: Yes    Comment: occasionally - last drink was July 4, 1 shot   Drug use: No   Sexual activity: Yes    Comment: hysterectomy  Other Topics Concern   Not on file  Social History Narrative   Lives at home in an apartment. Her mother lives with her.    Right handed   Caffeine: drinks approx. 36 oz of pepsi per day. Sometimes drinks 2  cups of coffee in a day as well.    Social Drivers of Corporate investment banker Strain: Low Risk  (03/06/2023)   Overall Financial Resource Strain (CARDIA)    Difficulty of Paying Living Expenses: Not very hard  Food Insecurity: Patient Declined (05/26/2023)   Hunger Vital Sign    Worried About Running Out of Food in the Last Year: Patient declined    Ran Out of Food in the Last Year: Patient declined  Recent Concern: Food Insecurity - Food Insecurity Present (05/24/2023)   Hunger Vital Sign    Worried About Running Out of Food in the Last Year: Sometimes true    Ran Out of Food in the Last Year: Often true  Transportation Needs: No Transportation Needs (05/26/2023)   PRAPARE - Administrator, Civil Service (Medical): No    Lack of Transportation (Non-Medical): No  Recent Concern:  Transportation Needs - Unmet Transportation Needs (05/24/2023)   PRAPARE - Administrator, Civil Service (Medical): Yes    Lack of Transportation (Non-Medical): No  Physical Activity: Sufficiently Active (05/22/2022)   Exercise Vital Sign    Days of Exercise per Week: 4 days    Minutes of Exercise per Session: 70 min  Stress: Stress Concern Present (03/06/2023)   [employer] of Occupational Health - Occupational Stress Questionnaire    Feeling of Stress : Very much  Social Connections: Socially Isolated (05/26/2023)   Social Connection and Isolation Panel [NHANES]    Frequency of Communication with Friends and Family: More than three times a week    Frequency of Social Gatherings with Friends and Family: More than three times a week    Attends Religious Services: Never    Database administrator or Organizations: No    Attends Engineer, structural: Never    Marital Status: Divorced   Past Medical History:  Past Medical History:  Diagnosis Date   Allergy    Anemia    Diverticulosis    Gall bladder stones 1993   Gastric ulcer    GERD (gastroesophageal reflux disease)    Hypertension    Renal mass     Past Surgical History:  Procedure Laterality Date   ABDOMINAL HYSTERECTOMY     BIOPSY  09/29/2022   Procedure: BIOPSY;  Surgeon: Napoleon Form, MD;  Location: [facility];  Service: Gastroenterology;;   CHOLECYSTECTOMY     COLONOSCOPY WITH ESOPHAGOGASTRODUODENOSCOPY (EGD)  04/07/2023   [facility]   ESOPHAGOGASTRODUODENOSCOPY (EGD) WITH PROPOFOL N/A 09/29/2022   Procedure: ESOPHAGOGASTRODUODENOSCOPY (EGD) WITH PROPOFOL;  Surgeon: Napoleon Form, MD;  Location: [facility];  Service: Gastroenterology;  Laterality: N/A;    Current Medications: Current Facility-Administered Medications  Medication Dose Route Frequency Provider Last Rate Last Admin   acetaminophen (TYLENOL) tablet 650 mg  650 mg Oral Q6H PRN Maryagnes Amos, FNP   650 mg  at 06/04/23 1734   albuterol (PROVENTIL) (2.5 MG/3ML) 0.083% nebulizer solution 2.5 mg  2.5 mg Nebulization Q2H PRN Starkes-Perry, Juel Burrow, FNP       alum & mag hydroxide-simeth (MAALOX/MYLANTA) 200-200-20 MG/5ML suspension 30 mL  30 mL Oral Q4H PRN Starkes-Perry, Juel Burrow, FNP       FLUoxetine (PROZAC) capsule 20 mg  20 mg Oral Daily Maryagnes Amos, FNP   20 mg at 06/06/23 1610   hydrochlorothiazide (HYDRODIURIL) tablet 25 mg  25 mg Oral Daily Delfino Lovett, MD   25 mg at 06/06/23 0906   magnesium  hydroxide (MILK OF MAGNESIA) suspension 30 mL  30 mL Oral Daily PRN Rosario Adie, Juel Burrow, FNP       mirtazapine (REMERON) tablet 30 mg  30 mg Oral QHS Maryagnes Amos, FNP   30 mg at 06/05/23 2118   naphazoline-glycerin (CLEAR EYES REDNESS) ophth solution 1-2 drop  1-2 drop Both Eyes QID PRN Maryagnes Amos, FNP       nicotine (NICODERM CQ - dosed in mg/24 hours) patch 21 mg  21 mg Transdermal Daily Verner Chol, MD   21 mg at 06/06/23 0908   OLANZapine (ZYPREXA) injection 5 mg  5 mg Intramuscular TID PRN Starkes-Perry, Juel Burrow, FNP       OLANZapine zydis (ZYPREXA) disintegrating tablet 5 mg  5 mg Oral TID PRN Maryagnes Amos, FNP       ondansetron (ZOFRAN) tablet 4 mg  4 mg Oral Q6H PRN Maryagnes Amos, FNP       Or   ondansetron (ZOFRAN) injection 4 mg  4 mg Intravenous Q6H PRN Starkes-Perry, Juel Burrow, FNP       pantoprazole (PROTONIX) EC tablet 40 mg  40 mg Oral Daily Maryagnes Amos, FNP   40 mg at 06/06/23 4782   risperiDONE (RISPERDAL) tablet 0.5 mg  0.5 mg Oral Daily Maryagnes Amos, FNP   0.5 mg at 06/06/23 9562   traMADol (ULTRAM) tablet 50 mg  50 mg Oral Q8H PRN Verner Chol, MD   50 mg at 06/06/23 1308   traZODone (DESYREL) tablet 50 mg  50 mg Oral QHS Verner Chol, MD   50 mg at 06/05/23 2118     Blood Alcohol level:  Lab Results  Component Value Date   ETH <10 05/22/2023   ETH <10 12/22/2022    Metabolic Disorder Labs: Lab  Results  Component Value Date   HGBA1C 5.2 05/22/2023   MPG 102.54 05/22/2023   No results found for: "PROLACTIN" Lab Results  Component Value Date   CHOL 183 05/29/2023   TRIG 87 05/29/2023   HDL 63 05/29/2023   CHOLHDL 2.9 05/29/2023   VLDL 17 05/29/2023   LDLCALC 103 (H) 05/29/2023   LDLCALC 106 (H) 12/13/2021      Psychiatric Specialty Exam:  Presentation  General Appearance:  Appropriate for Environment; Casual  Eye Contact: Fair  Speech: Clear and Coherent  Speech Volume: Normal    Mood and Affect  Mood: "Fine"  Affect: Stable and animated   Thought Process  Thought Processes: Coherent  Descriptions of Associations:Intact  Orientation:Full (Time, Place and Person)  Thought Content:Logical  Hallucinations: Denies  Ideas of Reference:None  Suicidal Thoughts: Denies  Homicidal Thoughts: Denies Sensorium  Memory: Immediate Fair; Recent Fair; Remote Fair  Judgment: Improving  Insight: Improved   Executive Functions  Concentration: Fair  Attention Span: Fair  Recall: [employer] of Knowledge: Fair  Language: Fair   Psychomotor Activity  Psychomotor Activity: Normal  Musculoskeletal: Strength & Muscle Tone: within normal limits Gait & Station: normal Assets  Assets: Manufacturing systems engineer; Desire for Improvement; Physical Health    Physical Exam: Physical Exam Vitals and nursing note reviewed.  HENT:     Head: Normocephalic.     Nose: Nose normal.     Mouth/Throat:     Mouth: Mucous membranes are moist.  Cardiovascular:     Rate and Rhythm: Normal rate.     Pulses: Normal pulses.  Pulmonary:     Effort: Pulmonary effort is normal.  Abdominal:  General: Bowel sounds are normal.  Skin:    General: Skin is warm.  Neurological:     General: No focal deficit present.     Mental Status: She is alert.    Review of Systems  Constitutional: Negative.   HENT: Negative.    Eyes: Negative.    Respiratory: Negative.    Cardiovascular: Negative.   Gastrointestinal: Negative.   Genitourinary: Negative.   Neurological: Negative.    Blood pressure 118/65, pulse 90, temperature 98.2 F (36.8 C), resp. rate 17, height 5\' 4"  (1.626 m), weight 82.8 kg, SpO2 97%. Body mass index is 31.33 kg/m.  Diagnosis: Principal Problem:   MDD (major depressive disorder), recurrent episode, severe (HCC) Active Problems:   Essential hypertension   MDD (major depressive disorder), recurrent severe, without psychosis (HCC)   Ingestion of substance, intentional self-harm, sequela (HCC)   Cocaine abuse (HCC)   Pain and swelling of lower extremity   Treatment Plan Summary:   Safety and Monitoring:             -- Involuntary admission to inpatient psychiatric unit for safety, stabilization and treatment             -- Daily contact with patient to assess and evaluate symptoms and progress in treatment             -- Patient's case to be discussed in multi-disciplinary team meeting             -- Observation Level: q15 minute checks             -- Vital signs:  q12 hours             -- Precautions: suicide, elopement, and assault   2. Psychiatric Diagnoses and Treatment:               In the ED got restarted on her Prozac 20 mg daily, Remeron 30 mg nightly Risperdal 0.5 mg daily, added trazodone 50 mg nightly for insomnia   -- The risks/benefits/side-effects/alternatives to this medication were discussed in detail with the patient and time was given for questions. The patient consents to medication trial.                          -- Encouraged patient to participate in unit milieu and in scheduled group therapies                            3. Medical Issues Being Addressed:   None today, Patient has successfully completed her detox.  There are no medical concerns for the patient to be discharged to her rehab facility   4. Discharge Planning:              -CSW contacted [facility] admissions to  check on status of pt's bed on  06/06/23 They report there is no one available on the weekend who could approve of the status of pt's bed and that CSW will have to contact them on Monday, 06/08/23                Physician Treatment Plan for Primary Diagnosis: MDD (major depressive disorder), recurrent episode, severe (HCC) Long Term Goal(s): Improvement in symptoms so as ready for discharge   Short Term Goals: Ability to identify changes in lifestyle to reduce recurrence of condition will improve, Ability to verbalize feelings will improve, Ability to disclose and discuss suicidal ideas, Ability  to demonstrate self-control will improve, and Ability to identify and develop effective coping behaviors will improve     Lewanda Rife, MD

## 2023-06-06 NOTE — Progress Notes (Signed)
   06/06/23 0610  15 Minute Checks  Location Bedroom  Visual Appearance Calm  Behavior Sleeping  Sleep (Behavioral Health Patients Only)  Calculate sleep? (Click Yes once per 24 hr at 0600 safety check) Yes  Documented sleep last 24 hours 8.5

## 2023-06-06 NOTE — BHH Counselor (Signed)
 CSW contacted [facility] admissions to check on status of pt's bed.   They report there is no one available on the weekend who could approve of the status of pt's bed and that CSW will have to contact them on Monday.   CSW inquired about this because CSW Henderson Newcomer was told yesterday that someone who know on Saturday  They report at this time there is no one available who could schedule the date of the bed.   CSW will contact [facility] on Monday per their request   Natalie Edwards, MSW, [facility] 06/06/2023 1:54 PM

## 2023-06-06 NOTE — Progress Notes (Signed)
   06/05/23 2000  Psych Admission Type (Psych Patients Only)  Admission Status Voluntary  Psychosocial Assessment  Patient Complaints None  Eye Contact Fair  Facial Expression Flat  Affect Appropriate to circumstance  Speech Logical/coherent  Interaction Assertive  Motor Activity Slow  Appearance/Hygiene In scrubs  Behavior Characteristics Appropriate to situation  Mood Pleasant  Thought Process  Coherency WDL  Content WDL  Delusions None reported or observed  Perception WDL  Hallucination None reported or observed  Judgment Impaired  Confusion None  Danger to Self  Current suicidal ideation? Denies   Pt is alert and oriented. Pleasant and cooperative. C/o leg pain. Ultram 50mg  po given PRN for pain. Visible on the unit. Interacting with peers and staff. No behavior issues noted. Denies SI/HI/AVH. Pt remains safe on the unit.

## 2023-06-06 NOTE — BH IP Treatment Plan (Signed)
 Interdisciplinary Treatment and Diagnostic Plan Update  06/06/2023 Time of Session: 9:00 AM  Natalie Edwards MRN: [id number]  Principal Diagnosis: MDD (major depressive disorder), recurrent episode, severe (HCC)  Secondary Diagnoses: Principal Problem:   MDD (major depressive disorder), recurrent episode, severe (HCC) Active Problems:   Essential hypertension   MDD (major depressive disorder), recurrent severe, without psychosis (HCC)   Ingestion of substance, intentional self-harm, sequela (HCC)   Cocaine abuse (HCC)   Pain and swelling of lower extremity   Current Medications:  Current Facility-Administered Medications  Medication Dose Route Frequency Provider Last Rate Last Admin   acetaminophen (TYLENOL) tablet 650 mg  650 mg Oral Q6H PRN Maryagnes Amos, FNP   650 mg at 06/04/23 1734   albuterol (PROVENTIL) (2.5 MG/3ML) 0.083% nebulizer solution 2.5 mg  2.5 mg Nebulization Q2H PRN Starkes-Perry, Juel Burrow, FNP       alum & mag hydroxide-simeth (MAALOX/MYLANTA) 200-200-20 MG/5ML suspension 30 mL  30 mL Oral Q4H PRN Starkes-Perry, Juel Burrow, FNP       FLUoxetine (PROZAC) capsule 20 mg  20 mg Oral Daily Maryagnes Amos, FNP   20 mg at 06/06/23 0102   hydrochlorothiazide (HYDRODIURIL) tablet 25 mg  25 mg Oral Daily Delfino Lovett, MD   25 mg at 06/06/23 7253   magnesium hydroxide (MILK OF MAGNESIA) suspension 30 mL  30 mL Oral Daily PRN Maryagnes Amos, FNP       mirtazapine (REMERON) tablet 30 mg  30 mg Oral QHS Maryagnes Amos, FNP   30 mg at 06/05/23 2118   naphazoline-glycerin (CLEAR EYES REDNESS) ophth solution 1-2 drop  1-2 drop Both Eyes QID PRN Starkes-Perry, Juel Burrow, FNP       nicotine (NICODERM CQ - dosed in mg/24 hours) patch 21 mg  21 mg Transdermal Daily Verner Chol, MD   21 mg at 06/06/23 0908   OLANZapine (ZYPREXA) injection 5 mg  5 mg Intramuscular TID PRN Starkes-Perry, Juel Burrow, FNP       OLANZapine zydis (ZYPREXA) disintegrating tablet 5 mg  5 mg  Oral TID PRN Rosario Adie, Juel Burrow, FNP       ondansetron (ZOFRAN) tablet 4 mg  4 mg Oral Q6H PRN Starkes-Perry, Juel Burrow, FNP       Or   ondansetron (ZOFRAN) injection 4 mg  4 mg Intravenous Q6H PRN Starkes-Perry, Juel Burrow, FNP       pantoprazole (PROTONIX) EC tablet 40 mg  40 mg Oral Daily Maryagnes Amos, FNP   40 mg at 06/06/23 6644   risperiDONE (RISPERDAL) tablet 0.5 mg  0.5 mg Oral Daily Maryagnes Amos, FNP   0.5 mg at 06/06/23 0347   traMADol (ULTRAM) tablet 50 mg  50 mg Oral Q8H PRN Verner Chol, MD   50 mg at 06/06/23 4259   traZODone (DESYREL) tablet 50 mg  50 mg Oral QHS Verner Chol, MD   50 mg at 06/05/23 2118   PTA Medications: Medications Prior to Admission  Medication Sig Dispense Refill Last Dose/Taking   acetaminophen (TYLENOL) 500 MG tablet Take 500 mg by mouth every 6 (six) hours as needed for moderate pain (pain score 4-6).      amLODipine (NORVASC) 10 MG tablet Take 1 tablet (10 mg total) by mouth daily.      ciprofloxacin (CIPRO) 500 MG tablet Take 1 tablet (500 mg total) by mouth 2 (two) times daily.      dicyclomine (BENTYL) 20 MG tablet Take 1 tablet (20 mg total) by  mouth 3 (three) times daily before meals. 90 tablet 0    FLUoxetine (PROZAC) 20 MG capsule Take 1 capsule (20 mg total) by mouth daily. 30 capsule 3    mirtazapine (REMERON) 45 MG tablet TAKE 1 TABLET BY MOUTH AT  BEDTIME 90 tablet 3    pantoprazole (PROTONIX) 40 MG tablet Take 1 tablet (40 mg total) by mouth daily. (Patient taking differently: Take 40 mg by mouth daily as needed (acid reflux).) 30 tablet 3     Patient Stressors:    Patient Strengths:    Treatment Modalities: Medication Management, Group therapy, Case management,  1 to 1 session with clinician, Psychoeducation, Recreational therapy.   Physician Treatment Plan for Primary Diagnosis: MDD (major depressive disorder), recurrent episode, severe (HCC) Long Term Goal(s): Improvement in symptoms so as ready for  discharge   Short Term Goals: Ability to identify changes in lifestyle to reduce recurrence of condition will improve Ability to verbalize feelings will improve Ability to disclose and discuss suicidal ideas Ability to demonstrate self-control will improve Ability to identify and develop effective coping behaviors will improve Ability to maintain clinical measurements within normal limits will improve Compliance with prescribed medications will improve Ability to identify triggers associated with substance abuse/mental health issues will improve  Medication Management: Evaluate patient's response, side effects, and tolerance of medication regimen.  Therapeutic Interventions: 1 to 1 sessions, Unit Group sessions and Medication administration.  Evaluation of Outcomes: Progressing  Physician Treatment Plan for Secondary Diagnosis: Principal Problem:   MDD (major depressive disorder), recurrent episode, severe (HCC) Active Problems:   Essential hypertension   MDD (major depressive disorder), recurrent severe, without psychosis (HCC)   Ingestion of substance, intentional self-harm, sequela (HCC)   Cocaine abuse (HCC)   Pain and swelling of lower extremity  Long Term Goal(s): Improvement in symptoms so as ready for discharge   Short Term Goals: Ability to identify changes in lifestyle to reduce recurrence of condition will improve Ability to verbalize feelings will improve Ability to disclose and discuss suicidal ideas Ability to demonstrate self-control will improve Ability to identify and develop effective coping behaviors will improve Ability to maintain clinical measurements within normal limits will improve Compliance with prescribed medications will improve Ability to identify triggers associated with substance abuse/mental health issues will improve     Medication Management: Evaluate patient's response, side effects, and tolerance of medication regimen.  Therapeutic  Interventions: 1 to 1 sessions, Unit Group sessions and Medication administration.  Evaluation of Outcomes: Progressing   RN Treatment Plan for Primary Diagnosis: MDD (major depressive disorder), recurrent episode, severe (HCC) Long Term Goal(s): Knowledge of disease and therapeutic regimen to maintain health will improve  Short Term Goals: Ability to remain free from injury will improve, Ability to verbalize frustration and anger appropriately will improve, Ability to demonstrate self-control, Ability to participate in decision making will improve, Ability to verbalize feelings will improve, Ability to disclose and discuss suicidal ideas, Ability to identify and develop effective coping behaviors will improve, and Compliance with prescribed medications will improve  Medication Management: RN will administer medications as ordered by provider, will assess and evaluate patient's response and provide education to patient for prescribed medication. RN will report any adverse and/or side effects to prescribing provider.  Therapeutic Interventions: 1 on 1 counseling sessions, Psychoeducation, Medication administration, Evaluate responses to treatment, Monitor vital signs and CBGs as ordered, Perform/monitor CIWA, COWS, AIMS and Fall Risk screenings as ordered, Perform wound care treatments as ordered.  Evaluation of Outcomes: Progressing  LCSW Treatment Plan for Primary Diagnosis: MDD (major depressive disorder), recurrent episode, severe (HCC) Long Term Goal(s): Safe transition to appropriate next level of care at discharge, Engage patient in therapeutic group addressing interpersonal concerns.  Short Term Goals: Engage patient in aftercare planning with referrals and resources, Increase social support, Increase ability to appropriately verbalize feelings, Increase emotional regulation, Facilitate acceptance of mental health diagnosis and concerns, Facilitate patient progression through stages of  change regarding substance use diagnoses and concerns, Identify triggers associated with mental health/substance abuse issues, and Increase skills for wellness and recovery  Therapeutic Interventions: Assess for all discharge needs, 1 to 1 time with Social worker, Explore available resources and support systems, Assess for adequacy in community support network, Educate family and significant other(s) on suicide prevention, Complete Psychosocial Assessment, Interpersonal group therapy.  Evaluation of Outcomes: Progressing   Progress in Treatment: Attending groups: Yes. and No. Participating in groups: Yes. and No. Taking medication as prescribed: Yes. Toleration medication: Yes. Family/Significant other contact made: No, will contact:  CSW will contact if given permission Patient understands diagnosis: Yes. Discussing patient identified problems/goals with staff: Yes. Medical problems stabilized or resolved: Yes. Denies suicidal/homicidal ideation: Yes. Issues/concerns per patient self-inventory: No. Other: None    New problem(s) identified: No, Describe:  None identified Update 06/01/23: No changes at this time Update 06/06/23: No changes at this time   New Short Term/Long Term Goal(s): detox, elimination of symptoms of psychosis, medication management for mood stabilization; elimination of SI thoughts; development of comprehensive mental wellness/sobriety plan. Update 06/01/23: No changes at this time Update 06/06/23: No changes at this time   Patient Goals:  " I just need somewhere to stay till I get back there. Just get myself better while I'm here" Update 06/01/23: No changes at this time Update 06/06/23: No changes at this time   Discharge Plan or Barriers: CSW will assist with appropriate discharge planning Update 06/01/23: No changes at this time Update 06/06/23: No changes at this time   Reason for Continuation of Hospitalization: Depression Medication stabilization   Estimated Length  of Stay: 1 to 7 days Update 06/01/23: TBD Update 06/06/23: TBD  Last 3 [place] Suicide Severity Risk Score: Flowsheet Row Admission (Current) from 05/26/2023 in [facility] to Hosp-Admission (Discharged) from 05/22/2023 in [place] [facility] ED from 03/15/2023 in [facility] Emergency Department at [facility]  C-SSRS RISK CATEGORY No Risk No Risk No Risk       Last [facility] 2/9 Scores:    03/10/2023   10:16 AM 10/28/2022    2:47 PM 10/08/2022   10:41 AM  Depression screen PHQ 2/9  Decreased Interest 1 0 0  Down, Depressed, Hopeless 0 0 0  PHQ - 2 Score 1 0 0  Altered sleeping  0 0  Tired, decreased energy  0 0  Change in appetite  0 0  Feeling bad or failure about yourself   0 0  Trouble concentrating  0 0  Moving slowly or fidgety/restless  0 0  Suicidal thoughts  0 0  PHQ-9 Score  0 0  Difficult doing work/chores  Not difficult at all Not difficult at all    Scribe for Treatment Team: Elza Rafter, [facility] 06/06/2023 10:13 AM

## 2023-06-06 NOTE — Group Note (Signed)
 Date:  06/06/2023 Time:  8:36 PM  Group Topic/Focus:  Goals Group:   The focus of this group is to help patients establish daily goals to achieve during treatment and discuss how the patient can incorporate goal setting into their daily lives to aide in recovery.    Participation Level:  Minimal  Participation Quality:  Appropriate  Affect:  Appropriate  Cognitive:  Appropriate  Insight: Appropriate  Engagement in Group:  Lacking  Modes of Intervention:  Activity  Additional Comments:    Natalie Edwards 06/06/2023, 8:36 PM

## 2023-06-07 NOTE — Plan of Care (Signed)
 Pt. took scheduled meds. Required pain medication for 7/10 pain. PRN Med was administered.

## 2023-06-07 NOTE — Progress Notes (Signed)
 Milanya Sunderland is a 58 y.o. female patient. No diagnosis found. Past Medical History:  Diagnosis Date   Allergy    Anemia    Diverticulosis    Gall bladder stones 1993   Gastric ulcer    GERD (gastroesophageal reflux disease)    Hypertension    Renal mass    Current Facility-Administered Medications  Medication Dose Route Frequency Provider Last Rate Last Admin   acetaminophen (TYLENOL) tablet 650 mg  650 mg Oral Q6H PRN Maryagnes Amos, FNP   650 mg at 06/04/23 1734   albuterol (PROVENTIL) (2.5 MG/3ML) 0.083% nebulizer solution 2.5 mg  2.5 mg Nebulization Q2H PRN Starkes-Perry, Juel Burrow, FNP       alum & mag hydroxide-simeth (MAALOX/MYLANTA) 200-200-20 MG/5ML suspension 30 mL  30 mL Oral Q4H PRN Starkes-Perry, Juel Burrow, FNP       FLUoxetine (PROZAC) capsule 20 mg  20 mg Oral Daily Maryagnes Amos, FNP   20 mg at 06/06/23 1610   hydrochlorothiazide (HYDRODIURIL) tablet 25 mg  25 mg Oral Daily Delfino Lovett, MD   25 mg at 06/06/23 9604   magnesium hydroxide (MILK OF MAGNESIA) suspension 30 mL  30 mL Oral Daily PRN Maryagnes Amos, FNP       mirtazapine (REMERON) tablet 30 mg  30 mg Oral QHS Maryagnes Amos, FNP   30 mg at 06/06/23 2116   naphazoline-glycerin (CLEAR EYES REDNESS) ophth solution 1-2 drop  1-2 drop Both Eyes QID PRN Maryagnes Amos, FNP       nicotine (NICODERM CQ - dosed in mg/24 hours) patch 21 mg  21 mg Transdermal Daily Verner Chol, MD   21 mg at 06/06/23 0908   OLANZapine (ZYPREXA) injection 5 mg  5 mg Intramuscular TID PRN Starkes-Perry, Juel Burrow, FNP       OLANZapine zydis (ZYPREXA) disintegrating tablet 5 mg  5 mg Oral TID PRN Rosario Adie, Juel Burrow, FNP       ondansetron (ZOFRAN) tablet 4 mg  4 mg Oral Q6H PRN Starkes-Perry, Juel Burrow, FNP       Or   ondansetron (ZOFRAN) injection 4 mg  4 mg Intravenous Q6H PRN Starkes-Perry, Juel Burrow, FNP       pantoprazole (PROTONIX) EC tablet 40 mg  40 mg Oral Daily Maryagnes Amos, FNP   40 mg  at 06/06/23 5409   risperiDONE (RISPERDAL) tablet 0.5 mg  0.5 mg Oral Daily Maryagnes Amos, FNP   0.5 mg at 06/06/23 8119   traMADol (ULTRAM) tablet 50 mg  50 mg Oral Q8H PRN Verner Chol, MD   50 mg at 06/06/23 2143   traZODone (DESYREL) tablet 50 mg  50 mg Oral QHS Verner Chol, MD   50 mg at 06/06/23 2117   Allergies  Allergen Reactions   Hydroxyzine Other (See Comments)    Palpitations and jitteriness    Ambien [Zolpidem Tartrate] Other (See Comments)    Jitteriness, nervousness, abdominal pain   Ibuprofen    Principal Problem:   MDD (major depressive disorder), recurrent episode, severe (HCC) Active Problems:   Essential hypertension   MDD (major depressive disorder), recurrent severe, without psychosis (HCC)   Ingestion of substance, intentional self-harm, sequela (HCC)   Cocaine abuse (HCC)   Pain and swelling of lower extremity  Blood pressure 119/68, pulse 90, temperature 98.4 F (36.9 C), resp. rate 16, height 5\' 4"  (1.626 m), weight 82.8 kg, SpO2 98%.  Subjective Objective: Vital signs: (most recent): Blood pressure 119/68, pulse 90,  temperature 98.4 F (36.9 C), resp. rate 16, height 5\' 4"  (1.626 m), weight 82.8 kg, SpO2 98%.    Assessment & Plan Continue to monitor pt. Pt denies SI/HI, small  brosett risk this shift.  Jomarion Mish B Tahirih Lair 06/07/2023

## 2023-06-07 NOTE — Progress Notes (Signed)
 [facility] MD Progress Note  06/07/2023  Natalie Edwards  MRN:  [id number]  Natalie Edwards is a 58 y.o. female admitted: Presented to the [facility] 05/22/2023  6:47 PM for cocaine overdose. She carries the psychiatric diagnoses of Major depressive disorder, recurrent without psychotic features, Cocaine abuse and previous suicide attempt, and has a past medical history of HTN, GERD. Her current presentation of recent cocaine overdose with past history of MDD and suicide attempts is most consistent with major depressive disorder, recurrent vs cocaine induced mood disorder. She meets criteria for inpatient psychiatric admission based on recent cocaine overdose and risk of harm to self.  Patient is admitted to the geropsychiatry unit for further stabilization   Subjective:  Chart reviewed, case discussed in multidisciplinary meeting, patient seen during rounds.  Per staff report patient is doing fine on the unit.   CSW contacted [facility] admissions to check on status of pt's bed.  They report there is no one available on the weekend who could approve of the status of pt's bed and that CSW will have to contact them on Monday, 06/08/23   On assessment today patient endorsed  good mood.  Patient was pleasant to talk to.  Patient said that the swelling in feet is going down.  Patient's medicines were adjusted by hospitalist on 05/28/2023.  Patient reports her sleep and appetite has improved.  She reports she has been attending groups and working on coping strategies. She denies any SI/HI/intent/plan.  She denies auditory/visual hallucinations.  Sleep: Fair  Appetite:  Fair  Past Psychiatric History: see h&P Family History:  Family History  Problem Relation Age of Onset   Cancer Maternal Uncle        Lung   Cancer Maternal Uncle        Lung   Cancer Maternal Uncle        Lung   Headache Neg Hx        "I don't think so"   Migraines Neg Hx        "I don't think so"   Social History:  Social History   Substance and  Sexual Activity  Alcohol Use Yes   Comment: occasionally - last drink was July 4, 1 shot     Social History   Substance and Sexual Activity  Drug Use No    Social History   Socioeconomic History   Marital status: Single    Spouse name: Not on file   Number of children: 3   Years of education: Not on file   Highest education level: 12th grade  Occupational History   Occupation: environmental services at [employer]  Tobacco Use   Smoking status: Every Day    Current packs/day: 0.50    Average packs/day: 0.5 packs/day for 25.0 years (12.5 ttl pk-yrs)    Types: Cigarettes   Smokeless tobacco: Never   Tobacco comments:    Pt tried to quit - helps with anxiety     1 pack last patient 3 days   Vaping Use   Vaping status: Never Used  Substance and Sexual Activity   Alcohol use: Yes    Comment: occasionally - last drink was July 4, 1 shot   Drug use: No   Sexual activity: Yes    Comment: hysterectomy  Other Topics Concern   Not on file  Social History Narrative   Lives at home in an apartment. Her mother lives with her.    Right handed   Caffeine: drinks approx. 36 oz of  pepsi per day. Sometimes drinks 2 cups of coffee in a day as well.    Social Drivers of Corporate investment banker Strain: Low Risk  (03/06/2023)   Overall Financial Resource Strain (CARDIA)    Difficulty of Paying Living Expenses: Not very hard  Food Insecurity: Patient Declined (05/26/2023)   Hunger Vital Sign    Worried About Running Out of Food in the Last Year: Patient declined    Ran Out of Food in the Last Year: Patient declined  Recent Concern: Food Insecurity - Food Insecurity Present (05/24/2023)   Hunger Vital Sign    Worried About Running Out of Food in the Last Year: Sometimes true    Ran Out of Food in the Last Year: Often true  Transportation Needs: No Transportation Needs (05/26/2023)   PRAPARE - Administrator, Civil Service (Medical): No    Lack of Transportation  (Non-Medical): No  Recent Concern: Transportation Needs - Unmet Transportation Needs (05/24/2023)   PRAPARE - Administrator, Civil Service (Medical): Yes    Lack of Transportation (Non-Medical): No  Physical Activity: Sufficiently Active (05/22/2022)   Exercise Vital Sign    Days of Exercise per Week: 4 days    Minutes of Exercise per Session: 70 min  Stress: Stress Concern Present (03/06/2023)   [employer] of Occupational Health - Occupational Stress Questionnaire    Feeling of Stress : Very much  Social Connections: Socially Isolated (05/26/2023)   Social Connection and Isolation Panel [NHANES]    Frequency of Communication with Friends and Family: More than three times a week    Frequency of Social Gatherings with Friends and Family: More than three times a week    Attends Religious Services: Never    Database administrator or Organizations: No    Attends Engineer, structural: Never    Marital Status: Divorced   Past Medical History:  Past Medical History:  Diagnosis Date   Allergy    Anemia    Diverticulosis    Gall bladder stones 1993   Gastric ulcer    GERD (gastroesophageal reflux disease)    Hypertension    Renal mass     Past Surgical History:  Procedure Laterality Date   ABDOMINAL HYSTERECTOMY     BIOPSY  09/29/2022   Procedure: BIOPSY;  Surgeon: Napoleon Form, MD;  Location: [facility];  Service: Gastroenterology;;   CHOLECYSTECTOMY     COLONOSCOPY WITH ESOPHAGOGASTRODUODENOSCOPY (EGD)  04/07/2023   [facility]   ESOPHAGOGASTRODUODENOSCOPY (EGD) WITH PROPOFOL N/A 09/29/2022   Procedure: ESOPHAGOGASTRODUODENOSCOPY (EGD) WITH PROPOFOL;  Surgeon: Napoleon Form, MD;  Location: [facility];  Service: Gastroenterology;  Laterality: N/A;    Current Medications: Current Facility-Administered Medications  Medication Dose Route Frequency Provider Last Rate Last Admin   acetaminophen (TYLENOL) tablet 650 mg  650 mg Oral Q6H PRN  Maryagnes Amos, FNP   650 mg at 06/04/23 1734   albuterol (PROVENTIL) (2.5 MG/3ML) 0.083% nebulizer solution 2.5 mg  2.5 mg Nebulization Q2H PRN Starkes-Perry, Juel Burrow, FNP       alum & mag hydroxide-simeth (MAALOX/MYLANTA) 200-200-20 MG/5ML suspension 30 mL  30 mL Oral Q4H PRN Starkes-Perry, Juel Burrow, FNP       FLUoxetine (PROZAC) capsule 20 mg  20 mg Oral Daily Maryagnes Amos, FNP   20 mg at 06/07/23 0847   hydrochlorothiazide (HYDRODIURIL) tablet 25 mg  25 mg Oral Daily Delfino Lovett, MD   25 mg  at 06/07/23 0846   magnesium hydroxide (MILK OF MAGNESIA) suspension 30 mL  30 mL Oral Daily PRN Maryagnes Amos, FNP       mirtazapine (REMERON) tablet 30 mg  30 mg Oral QHS Maryagnes Amos, FNP   30 mg at 06/06/23 2116   naphazoline-glycerin (CLEAR EYES REDNESS) ophth solution 1-2 drop  1-2 drop Both Eyes QID PRN Maryagnes Amos, FNP       nicotine (NICODERM CQ - dosed in mg/24 hours) patch 21 mg  21 mg Transdermal Daily Verner Chol, MD   21 mg at 06/07/23 0848   OLANZapine (ZYPREXA) injection 5 mg  5 mg Intramuscular TID PRN Starkes-Perry, Juel Burrow, FNP       OLANZapine zydis (ZYPREXA) disintegrating tablet 5 mg  5 mg Oral TID PRN Maryagnes Amos, FNP       ondansetron (ZOFRAN) tablet 4 mg  4 mg Oral Q6H PRN Maryagnes Amos, FNP       Or   ondansetron (ZOFRAN) injection 4 mg  4 mg Intravenous Q6H PRN Starkes-Perry, Juel Burrow, FNP       pantoprazole (PROTONIX) EC tablet 40 mg  40 mg Oral Daily Maryagnes Amos, FNP   40 mg at 06/07/23 0847   risperiDONE (RISPERDAL) tablet 0.5 mg  0.5 mg Oral Daily Maryagnes Amos, FNP   0.5 mg at 06/07/23 0846   traMADol (ULTRAM) tablet 50 mg  50 mg Oral Q8H PRN Verner Chol, MD   50 mg at 06/07/23 0855   traZODone (DESYREL) tablet 50 mg  50 mg Oral QHS Verner Chol, MD   50 mg at 06/06/23 2117     Blood Alcohol level:  Lab Results  Component Value Date   ETH <10 05/22/2023   ETH <10 12/22/2022     Metabolic Disorder Labs: Lab Results  Component Value Date   HGBA1C 5.2 05/22/2023   MPG 102.54 05/22/2023   No results found for: "PROLACTIN" Lab Results  Component Value Date   CHOL 183 05/29/2023   TRIG 87 05/29/2023   HDL 63 05/29/2023   CHOLHDL 2.9 05/29/2023   VLDL 17 05/29/2023   LDLCALC 103 (H) 05/29/2023   LDLCALC 106 (H) 12/13/2021      Psychiatric Specialty Exam:  Presentation  General Appearance:  Appropriate for Environment; Casual  Eye Contact: Fair  Speech: Clear and Coherent  Speech Volume: Normal    Mood and Affect  Mood: "Fine"  Affect: Stable and animated   Thought Process  Thought Processes: Coherent  Descriptions of Associations:Intact  Orientation:Full (Time, Place and Person)  Thought Content:Logical  Hallucinations: Denies  Ideas of Reference:None  Suicidal Thoughts: Denies  Homicidal Thoughts: Denies Sensorium  Memory: Immediate Fair; Recent Fair; Remote Fair  Judgment: Improving  Insight: Improved   Executive Functions  Concentration: Fair  Attention Span: Fair  Recall: [employer] of Knowledge: Fair  Language: Fair   Psychomotor Activity  Psychomotor Activity: Normal  Musculoskeletal: Strength & Muscle Tone: within normal limits Gait & Station: normal Assets  Assets: Manufacturing systems engineer; Desire for Improvement; Physical Health    Physical Exam: Physical Exam Vitals and nursing note reviewed.  HENT:     Head: Normocephalic.     Nose: Nose normal.     Mouth/Throat:     Mouth: Mucous membranes are moist.  Cardiovascular:     Rate and Rhythm: Normal rate.     Pulses: Normal pulses.  Pulmonary:     Effort: Pulmonary effort is normal.  Abdominal:     General: Bowel sounds are normal.  Skin:    General: Skin is warm.  Neurological:     General: No focal deficit present.     Mental Status: She is alert.    Review of Systems  Constitutional: Negative.   HENT:  Negative.    Eyes: Negative.   Respiratory: Negative.    Cardiovascular: Negative.   Gastrointestinal: Negative.   Genitourinary: Negative.   Neurological: Negative.    Blood pressure 117/66, pulse 100, temperature 98.4 F (36.9 C), resp. rate 16, height 5\' 4"  (1.626 m), weight 82.8 kg, SpO2 98%. Body mass index is 31.33 kg/m.  Diagnosis: Principal Problem:   MDD (major depressive disorder), recurrent episode, severe (HCC) Active Problems:   Essential hypertension   MDD (major depressive disorder), recurrent severe, without psychosis (HCC)   Ingestion of substance, intentional self-harm, sequela (HCC)   Cocaine abuse (HCC)   Pain and swelling of lower extremity   Treatment Plan Summary:   Safety and Monitoring:             -- Involuntary admission to inpatient psychiatric unit for safety, stabilization and treatment             -- Daily contact with patient to assess and evaluate symptoms and progress in treatment             -- Patient's case to be discussed in multi-disciplinary team meeting             -- Observation Level: q15 minute checks             -- Vital signs:  q12 hours             -- Precautions: suicide, elopement, and assault   2. Psychiatric Diagnoses and Treatment:               In the ED got restarted on her Prozac 20 mg daily, Remeron 30 mg nightly Risperdal 0.5 mg daily, added trazodone 50 mg nightly for insomnia   -- The risks/benefits/side-effects/alternatives to this medication were discussed in detail with the patient and time was given for questions. The patient consents to medication trial.                          -- Encouraged patient to participate in unit milieu and in scheduled group therapies                            3. Medical Issues Being Addressed:   None today, Patient has successfully completed her detox.  There are no medical concerns for the patient to be discharged to her rehab facility   4. Discharge Planning:              -CSW  contacted [facility] admissions to check on status of pt's bed on  06/06/23 They report there is no one available on the weekend who could approve of the status of pt's bed and that CSW will have to contact them on Monday, 06/08/23                Physician Treatment Plan for Primary Diagnosis: MDD (major depressive disorder), recurrent episode, severe (HCC) Long Term Goal(s): Improvement in symptoms so as ready for discharge   Short Term Goals: Ability to identify changes in lifestyle to reduce recurrence of condition will improve, Ability to verbalize feelings will improve, Ability to disclose  and discuss suicidal ideas, Ability to demonstrate self-control will improve, and Ability to identify and develop effective coping behaviors will improve     Lewanda Rife, MD

## 2023-06-07 NOTE — Plan of Care (Signed)
 D: Pt alert and oriented. Pt denies experiencing any anxiety/depression at this time. Pt reports experiencing chronic bilat foot pain, prn medication given. Pt denies experiencing any SI/HI, or AVH at this time.   A: Scheduled medications administered to pt, per MD orders. Support and encouragement provided. Frequent verbal contact made. Routine safety checks conducted q15 minutes.   R: No adverse drug reactions noted. Pt verbally contracts for safety at this time. Pt compliant with medications and treatment plan. Pt interacts well with others on the unit. Pt remains safe at this time. Plan of care ongoing.  Problem: Activity: Goal: Interest or engagement in leisure activities will improve Outcome: Progressing   Problem: Coping: Goal: Coping ability will improve Outcome: Progressing

## 2023-06-07 NOTE — Group Note (Signed)
 Date:  06/07/2023 Time:  10:02 PM  Group Topic/Focus:  Wrap-Up Group:   The focus of this group is to help patients review their daily goal of treatment and discuss progress on daily workbooks.    Participation Level:  Minimal  Participation Quality:  Appropriate  Affect:  Appropriate  Cognitive:  Alert  Insight: Improving  Engagement in Group:  Improving  Modes of Intervention:  Discussion  Additional Comments:    Natalie Edwards 06/07/2023, 10:02 PM

## 2023-06-08 NOTE — [facility] Counselor (Signed)
 CSW contacted [phone], to check on pt's bed.    They report to call back at 1:30 PM after clinician is back from lunch  Reynaldo Minium, MSW, [facility] 06/08/2023 2:38 PM

## 2023-06-08 NOTE — Progress Notes (Signed)
 [facility] MD Progress Note  06/08/2023 5:09 PM Natalie Edwards  MRN:  [id number]  Natalie Edwards is a 58 y.o. female admitted: Presented to the [facility] 05/22/2023  6:47 PM for cocaine overdose. She carries the psychiatric diagnoses of Major depressive disorder, recurrent without psychotic features, Cocaine abuse and previous suicide attempt, and has a past medical history of HTN, GERD. Her current presentation of recent cocaine overdose with past history of MDD and suicide attempts is most consistent with major depressive disorder, recurrent vs cocaine induced mood disorder. She meets criteria for inpatient psychiatric admission based on recent cocaine overdose and risk of harm to self.  Patient is admitted to the geropsychiatry unit for further stabilization   Subjective:  Chart reviewed, case discussed in multidisciplinary meeting, patient seen during rounds.  Patient is noted to be participating in groups.  She offers no complaints.  She reports that she is ready to leave the hospital.  She expressed her understanding that finding a place in the rehab is very important for her.  Social worker is working with her in confirming the bed and one of the rehabs.  She denies SI/HI/intent/plan.  She denies auditory/visual hallucinations.  She is taking her medications with no reported side effects.   Sleep: Fair  Appetite:  Fair  Past Psychiatric History: see h&P Family History:  Family History  Problem Relation Age of Onset   Cancer Maternal Uncle        Lung   Cancer Maternal Uncle        Lung   Cancer Maternal Uncle        Lung   Headache Neg Hx        "I don't think so"   Migraines Neg Hx        "I don't think so"   Social History:  Social History   Substance and Sexual Activity  Alcohol Use Yes   Comment: occasionally - last drink was July 4, 1 shot     Social History   Substance and Sexual Activity  Drug Use No    Social History   Socioeconomic History   Marital status: Single    Spouse  name: Not on file   Number of children: 3   Years of education: Not on file   Highest education level: 12th grade  Occupational History   Occupation: environmental services at [employer]  Tobacco Use   Smoking status: Every Day    Current packs/day: 0.50    Average packs/day: 0.5 packs/day for 25.0 years (12.5 ttl pk-yrs)    Types: Cigarettes   Smokeless tobacco: Never   Tobacco comments:    Pt tried to quit - helps with anxiety     1 pack last patient 3 days   Vaping Use   Vaping status: Never Used  Substance and Sexual Activity   Alcohol use: Yes    Comment: occasionally - last drink was July 4, 1 shot   Drug use: No   Sexual activity: Yes    Comment: hysterectomy  Other Topics Concern   Not on file  Social History Narrative   Lives at home in an apartment. Her mother lives with her.    Right handed   Caffeine: drinks approx. 36 oz of pepsi per day. Sometimes drinks 2 cups of coffee in a day as well.    Social Drivers of Health   Financial Resource Strain: Low Risk  (03/06/2023)   Overall Financial Resource Strain (CARDIA)    Difficulty of Paying  Living Expenses: Not very hard  Food Insecurity: Patient Declined (05/26/2023)   Hunger Vital Sign    Worried About Running Out of Food in the Last Year: Patient declined    Ran Out of Food in the Last Year: Patient declined  Recent Concern: Food Insecurity - Food Insecurity Present (05/24/2023)   Hunger Vital Sign    Worried About Running Out of Food in the Last Year: Sometimes true    Ran Out of Food in the Last Year: Often true  Transportation Needs: No Transportation Needs (05/26/2023)   PRAPARE - Administrator, Civil Service (Medical): No    Lack of Transportation (Non-Medical): No  Recent Concern: Transportation Needs - Unmet Transportation Needs (05/24/2023)   PRAPARE - Administrator, Civil Service (Medical): Yes    Lack of Transportation (Non-Medical): No  Physical Activity: Sufficiently Active  (05/22/2022)   Exercise Vital Sign    Days of Exercise per Week: 4 days    Minutes of Exercise per Session: 70 min  Stress: Stress Concern Present (03/06/2023)   [employer] of Occupational Health - Occupational Stress Questionnaire    Feeling of Stress : Very much  Social Connections: Socially Isolated (05/26/2023)   Social Connection and Isolation Panel [NHANES]    Frequency of Communication with Friends and Family: More than three times a week    Frequency of Social Gatherings with Friends and Family: More than three times a week    Attends Religious Services: Never    Database administrator or Organizations: No    Attends Engineer, structural: Never    Marital Status: Divorced   Past Medical History:  Past Medical History:  Diagnosis Date   Allergy    Anemia    Diverticulosis    Gall bladder stones 1993   Gastric ulcer    GERD (gastroesophageal reflux disease)    Hypertension    Renal mass     Past Surgical History:  Procedure Laterality Date   ABDOMINAL HYSTERECTOMY     BIOPSY  09/29/2022   Procedure: BIOPSY;  Surgeon: Napoleon Form, MD;  Location: [facility];  Service: Gastroenterology;;   CHOLECYSTECTOMY     COLONOSCOPY WITH ESOPHAGOGASTRODUODENOSCOPY (EGD)  04/07/2023   [facility]   ESOPHAGOGASTRODUODENOSCOPY (EGD) WITH PROPOFOL N/A 09/29/2022   Procedure: ESOPHAGOGASTRODUODENOSCOPY (EGD) WITH PROPOFOL;  Surgeon: Napoleon Form, MD;  Location: [facility];  Service: Gastroenterology;  Laterality: N/A;    Current Medications: Current Facility-Administered Medications  Medication Dose Route Frequency Provider Last Rate Last Admin   acetaminophen (TYLENOL) tablet 650 mg  650 mg Oral Q6H PRN Maryagnes Amos, FNP   650 mg at 06/04/23 1734   albuterol (PROVENTIL) (2.5 MG/3ML) 0.083% nebulizer solution 2.5 mg  2.5 mg Nebulization Q2H PRN Starkes-Perry, Juel Burrow, FNP       alum & mag hydroxide-simeth (MAALOX/MYLANTA) 200-200-20 MG/5ML  suspension 30 mL  30 mL Oral Q4H PRN Starkes-Perry, Juel Burrow, FNP       FLUoxetine (PROZAC) capsule 20 mg  20 mg Oral Daily Maryagnes Amos, FNP   20 mg at 06/08/23 1008   hydrochlorothiazide (HYDRODIURIL) tablet 25 mg  25 mg Oral Daily Delfino Lovett, MD   25 mg at 06/08/23 1007   magnesium hydroxide (MILK OF MAGNESIA) suspension 30 mL  30 mL Oral Daily PRN Maryagnes Amos, FNP       mirtazapine (REMERON) tablet 30 mg  30 mg Oral QHS Starkes-Perry, Juel Burrow, FNP  30 mg at 06/07/23 2153   naphazoline-glycerin (CLEAR EYES REDNESS) ophth solution 1-2 drop  1-2 drop Both Eyes QID PRN Starkes-Perry, Juel Burrow, FNP       nicotine (NICODERM CQ - dosed in mg/24 hours) patch 21 mg  21 mg Transdermal Daily Verner Chol, MD   21 mg at 06/08/23 1015   OLANZapine (ZYPREXA) injection 5 mg  5 mg Intramuscular TID PRN Starkes-Perry, Juel Burrow, FNP       OLANZapine zydis (ZYPREXA) disintegrating tablet 5 mg  5 mg Oral TID PRN Rosario Adie, Juel Burrow, FNP       ondansetron (ZOFRAN) tablet 4 mg  4 mg Oral Q6H PRN Starkes-Perry, Juel Burrow, FNP       Or   ondansetron (ZOFRAN) injection 4 mg  4 mg Intravenous Q6H PRN Starkes-Perry, Juel Burrow, FNP       pantoprazole (PROTONIX) EC tablet 40 mg  40 mg Oral Daily Maryagnes Amos, FNP   40 mg at 06/08/23 1007   risperiDONE (RISPERDAL) tablet 0.5 mg  0.5 mg Oral Daily Maryagnes Amos, FNP   0.5 mg at 06/08/23 1008   traMADol (ULTRAM) tablet 50 mg  50 mg Oral Q8H PRN Verner Chol, MD   50 mg at 06/07/23 2153   traZODone (DESYREL) tablet 50 mg  50 mg Oral QHS Verner Chol, MD   50 mg at 06/07/23 2153    Lab Results:  No results found for this or any previous visit (from the past 48 hours).   Blood Alcohol level:  Lab Results  Component Value Date   ETH <10 05/22/2023   ETH <10 12/22/2022    Metabolic Disorder Labs: Lab Results  Component Value Date   HGBA1C 5.2 05/22/2023   MPG 102.54 05/22/2023   No results found for: "PROLACTIN" Lab  Results  Component Value Date   CHOL 183 05/29/2023   TRIG 87 05/29/2023   HDL 63 05/29/2023   CHOLHDL 2.9 05/29/2023   VLDL 17 05/29/2023   LDLCALC 103 (H) 05/29/2023   LDLCALC 106 (H) 12/13/2021    Physical Findings: AIMS:  , ,  ,  ,    CIWA:    COWS:      Psychiatric Specialty Exam:  Presentation  General Appearance:  Appropriate for Environment; Casual  Eye Contact: Fair  Speech: Clear and Coherent  Speech Volume: Normal    Mood and Affect  Mood: Depressed  Affect: Depressed   Thought Process  Thought Processes: Coherent  Descriptions of Associations:Intact  Orientation:Full (Time, Place and Person)  Thought Content:Logical  Hallucinations:Hallucinations: None  Ideas of Reference:None  Suicidal Thoughts:Suicidal Thoughts: No  Homicidal Thoughts:Homicidal Thoughts: No   Sensorium  Memory: Immediate Fair; Recent Fair; Remote Fair  Judgment: Impaired  Insight: Shallow   Executive Functions  Concentration: Fair  Attention Span: Fair  Recall: [employer] of Knowledge: Fair  Language: Fair   Psychomotor Activity  Psychomotor Activity: Psychomotor Activity: Normal  Musculoskeletal: Strength & Muscle Tone: within normal limits Gait & Station: normal Assets  Assets: Manufacturing systems engineer; Financial Resources/Insurance; Resilience; Physical Health    Physical Exam: Physical Exam Vitals and nursing note reviewed.  HENT:     Head: Normocephalic.     Right Ear: Tympanic membrane normal.     Nose: Nose normal.     Mouth/Throat:     Mouth: Mucous membranes are moist.  Cardiovascular:     Rate and Rhythm: Normal rate.     Pulses: Normal pulses.  Pulmonary:  Breath sounds: Normal breath sounds.  Abdominal:     General: Bowel sounds are normal.  Skin:    General: Skin is warm.  Neurological:     General: No focal deficit present.     Mental Status: She is alert.    ROS Blood pressure 130/72, pulse 87,  temperature 98.4 F (36.9 C), temperature source Oral, resp. rate 17, height 5\' 4"  (1.626 m), weight 82.8 kg, SpO2 97%. Body mass index is 31.33 kg/m.  Diagnosis: Principal Problem:   MDD (major depressive disorder), recurrent episode, severe (HCC) Active Problems:   Essential hypertension   MDD (major depressive disorder), recurrent severe, without psychosis (HCC)   Ingestion of substance, intentional self-harm, sequela (HCC)   Cocaine abuse (HCC)   Pain and swelling of lower extremity  Clinical Decision Making: Patient was recently medically admitted after being found unresponsive in her house with possible overdose on her medications of cocaine.  Patient displays symptoms of depression, anhedonia, hopelessness in the context of multiple psychosocial stressors including death of her mom, homelessness, losing job etc. patient needs inpatient hospitalization for further stabilization.   Treatment Plan Summary:   Safety and Monitoring:             -- Involuntary admission to inpatient psychiatric unit for safety, stabilization and treatment             -- Daily contact with patient to assess and evaluate symptoms and progress in treatment             -- Patient's case to be discussed in multi-disciplinary team meeting             -- Observation Level: q15 minute checks             -- Vital signs:  q12 hours             -- Precautions: suicide, elopement, and assault   2. Psychiatric Diagnoses and Treatment:               Prozac 20 mg daily, Remeron 30 mg nightly Risperdal 0.5 mg daily, trazodone 50 mg nightly for insomnia   -- The risks/benefits/side-effects/alternatives to this medication were discussed in detail with the patient and time was given for questions. The patient consents to medication trial.                -- Metabolic profile and EKG monitoring obtained while on an atypical antipsychotic (BMI: Lipid Panel: HbgA1c: QTc:)              -- Encouraged patient to participate  in unit milieu and in scheduled group therapies                            3. Medical Issues Being Addressed:  Bilateral pitting edema of lower extremities-hospitalist consult made and EKG stat ordered   4. Discharge Planning:              -- Social work and case management to assist with discharge planning and identification of hospital follow-up needs prior to discharge             -- Estimated LOS: 5-7 days             -- Discharge Concerns: Need to establish a safety plan; Medication compliance and effectiveness             -- Discharge Goals: Return home with outpatient referrals follow ups  Physician Treatment Plan for Primary Diagnosis: MDD (major depressive disorder), recurrent episode, severe (HCC) Long Term Goal(s): Improvement in symptoms so as ready for discharge   Short Term Goals: Ability to identify changes in lifestyle to reduce recurrence of condition will improve, Ability to verbalize feelings will improve, Ability to disclose and discuss suicidal ideas, Ability to demonstrate self-control will improve, and Ability to identify and develop effective coping behaviors will improve   Physician Treatment Plan for Secondary Diagnosis: Principal Problem:   MDD (major depressive disorder), recurrent episode, severe (HCC)   Long Term Goal(s): Improvement in symptoms so as ready for discharge   Short Term Goals: Ability to identify changes in lifestyle to reduce recurrence of condition will improve, Ability to verbalize feelings will improve, Ability to disclose and discuss suicidal ideas, Ability to demonstrate self-control will improve, Ability to identify and develop effective coping behaviors will improve, Ability to maintain clinical measurements within normal limits will improve, Compliance with prescribed medications will improve, and Ability to identify triggers associated with substance abuse/mental health issues will improve   Verner Chol, MD 06/08/2023, 5:09 PM

## 2023-06-08 NOTE — Progress Notes (Signed)
   06/07/23 2153  Psych Admission Type (Psych Patients Only)  Admission Status Voluntary  Psychosocial Assessment  Patient Complaints None  Eye Contact Fair  Facial Expression Flat  Affect Appropriate to circumstance  Speech Logical/coherent  Interaction Assertive  Motor Activity Slow  Appearance/Hygiene In scrubs  Behavior Characteristics Cooperative  Mood Pleasant  Aggressive Behavior  Effect No apparent injury  Thought Process  Coherency WDL  Content WDL  Delusions None reported or observed  Perception WDL  Hallucination None reported or observed  Judgment Impaired  Confusion None  Danger to Self  Current suicidal ideation? Denies  Agreement Not to Harm Self Yes  Description of Agreement Verbal  Danger to Others  Danger to Others None reported or observed

## 2023-06-08 NOTE — Plan of Care (Signed)
  Problem: Education: Goal: Utilization of techniques to improve thought processes will improve Outcome: Progressing Goal: Knowledge of the prescribed therapeutic regimen will improve Outcome: Progressing   Problem: Activity: Goal: Interest or engagement in leisure activities will improve Outcome: Progressing Goal: Imbalance in normal sleep/wake cycle will improve Outcome: Progressing   

## 2023-06-08 NOTE — Progress Notes (Addendum)
 Patient is pleasant and cooperative.  Denies SI/HI and AVH.  Denies anxiety and depression.  Pain rated 6/10 in feet.  Reports she slept well.    Compliant with scheduled medications.  15 min checks in place for safety.  Patient is present in the milieu.  Appropriate interaction with peers and staff.

## 2023-06-08 NOTE — Group Note (Signed)
 Recreation Therapy Group Note   Group Topic:Coping Skills  Group Date: 06/08/2023 Start Time: 1400 End Time: 1450 Facilitators: Rosina Lowenstein, LRT, CTRS Location:  Dayroom  Group Description: Mind Map.  Patient was provided a blank template of a diagram with 32 blank boxes in a tiered system, branching from the center (similar to a bubble chart). LRT directed patients to label the middle of the diagram "Coping Skills". LRT and patients then came up with 8 different coping skills as examples. Pt were directed to record their coping skills in the 2nd tier boxes closest to the center.  Patients would then share their coping skills with the group as LRT wrote them out. LRT gave a handout of 99 different coping skills at the end of group.   Goal Area(s) Addressed: Patients will be able to define "coping skills". Patient will identify new coping skills.  Patient will increase communication.   Affect/Mood: N/A   Participation Level: Did not attend    Clinical Observations/Individualized Feedback: Patient did not attend group.   Plan: Continue to engage patient in RT group sessions 2-3x/week.   Rosina Lowenstein, LRT, CTRS 06/08/2023 5:04 PM

## 2023-06-08 NOTE — BHH Counselor (Signed)
 CSW contacted [facility],[phone], to check on pt's bed.   They report their clinician has not reviewed it at this time.   CSW will contact [facility] again in the afternoon.   Reynaldo Minium, MSW, [place] 06/08/2023 9:21 AM

## 2023-06-08 NOTE — Plan of Care (Signed)
 Pt denies SI/HI/AVH. Complaints of bilateral foot pain. Pt is pleasant and cooperative. Pt was offered support and encouragement. Pt was given scheduled medications and PRNs. Pt was encourage to attend groups. Q 15 minute safety checks were done for safety. Pt attends groups and interacts well with peers and staff. Pt receptive to treatment and safety maintained on unit.  Problem: Education: Goal: Utilization of techniques to improve thought processes will improve Outcome: Progressing Goal: Knowledge of the prescribed therapeutic regimen will improve Outcome: Progressing   Problem: Coping: Goal: Coping ability will improve Outcome: Progressing Goal: Will verbalize feelings Outcome: Progressing   Problem: Health Behavior/Discharge Planning: Goal: Ability to make decisions will improve Outcome: Progressing Goal: Compliance with therapeutic regimen will improve Outcome: Progressing   Problem: Safety: Goal: Ability to disclose and discuss suicidal ideas will improve Outcome: Progressing Goal: Ability to identify and utilize support systems that promote safety will improve Outcome: Progressing

## 2023-06-08 NOTE — Progress Notes (Signed)
   06/08/23 0600  15 Minute Checks  Location Bedroom  Visual Appearance Calm  Behavior Sleeping  Sleep (Behavioral Health Patients Only)  Calculate sleep? (Click Yes once per 24 hr at 0600 safety check) Yes  Documented sleep last 24 hours 9.25

## 2023-06-08 NOTE — Group Note (Signed)
 Date:  06/08/2023 Time:  9:10 PM  Group Topic/Focus:  Wrap-Up Group:   The focus of this group is to help patients review their daily goal of treatment and discuss progress on daily workbooks.    Participation Level:  Minimal  Participation Quality:  Appropriate  Affect:  Appropriate  Cognitive:  Appropriate  Insight: Appropriate  Engagement in Group:  Improving  Modes of Intervention:  Discussion  Additional Comments:    Jurline Folger K Mostyn Varnell 06/08/2023, 9:10 PM

## 2023-06-08 NOTE — B[facility] Counselor (Signed)
 CSW contacted [facility] at [phone]  They report they need a med list sent to [email or web address]   CSW sent medlist as requested  Reynaldo Minium, MSW, [facility] 06/08/2023 2:41 PM

## 2023-06-09 NOTE — BHH Counselor (Signed)
 CSW contacted [facility] at [phone] to check on pt's bed.   They report they need to talk with pt directly.   CSW transferred call to pt.   Reynaldo Minium, MSW, [place] 06/09/2023 1:18 PM

## 2023-06-09 NOTE — Plan of Care (Signed)
  Problem: Education: Goal: Knowledge of the prescribed therapeutic regimen will improve Outcome: Progressing   Problem: Activity: Goal: Interest or engagement in leisure activities will improve Outcome: Progressing   Problem: Coping: Goal: Coping ability will improve Outcome: Progressing Goal: Will verbalize feelings Outcome: Progressing   

## 2023-06-09 NOTE — Progress Notes (Signed)
   06/09/23 1610  15 Minute Checks  Location Bedroom  Visual Appearance Calm  Behavior Sleeping  Sleep (Behavioral Health Patients Only)  Calculate sleep? (Click Yes once per 24 hr at 0600 safety check) Yes  Documented sleep last 24 hours 9.5

## 2023-06-09 NOTE — Plan of Care (Signed)
  Problem: Education: Goal: Utilization of techniques to improve thought processes will improve Outcome: Progressing Goal: Knowledge of the prescribed therapeutic regimen will improve Outcome: Progressing   Problem: Activity: Goal: Interest or engagement in leisure activities will improve Outcome: Progressing Goal: Imbalance in normal sleep/wake cycle will improve Outcome: Progressing   Problem: Coping: Goal: Coping ability will improve Outcome: Progressing Goal: Will verbalize feelings Outcome: Progressing   Problem: Safety: Goal: Ability to disclose and discuss suicidal ideas will improve Outcome: Progressing Goal: Ability to identify and utilize support systems that promote safety will improve Outcome: Progressing

## 2023-06-09 NOTE — BHH Counselor (Signed)
 Pt reports that representative told her they are still waiting for it to be reviewed.   CSW will call back later in the afternoon.   Reynaldo Minium, MSW, [place] 06/09/2023 2:10 PM

## 2023-06-09 NOTE — Progress Notes (Signed)
   06/08/23 2100  Psych Admission Type (Psych Patients Only)  Admission Status Voluntary  Psychosocial Assessment  Patient Complaints None  Eye Contact Fair  Facial Expression Flat  Affect Appropriate to circumstance  Speech Logical/coherent  Interaction Assertive  Motor Activity Slow  Appearance/Hygiene In scrubs  Behavior Characteristics Cooperative  Mood Pleasant  Thought Process  Coherency WDL  Content WDL  Delusions None reported or observed  Perception WDL  Hallucination None reported or observed  Judgment Impaired  Confusion None  Danger to Self  Current suicidal ideation? Denies

## 2023-06-09 NOTE — Plan of Care (Signed)

## 2023-06-09 NOTE — B[facility] Counselor (Signed)
 CSW contacted [facility] at [phone] to check on pt's bed.   They report to call back after lunch.   CSW will attempt to check on pt's bed again later in the day.   Reynaldo Minium, MSW, [place] 06/09/2023 10:10 AM

## 2023-06-09 NOTE — Group Note (Signed)
 Recreation Therapy Group Note   Group Topic:General Recreation  Group Date: 06/09/2023 Start Time: 1330 End Time: 1430 Facilitators: Rosina Lowenstein, LRT, CTRS Location: Courtyard  Group Description: Outdoor Recreation. Patients had the option to play corn hole, ring toss, bowling or listening to music while outside in the courtyard getting fresh air and sunlight. LRT and patients discussed things that they enjoy doing in their free time outside of the hospital. LRT encouraged patients to drink water after being active and getting their heart rate up.   Goal Area(s) Addressed: Patient will identify leisure interests.  Patient will practice healthy decision making. Patient will engage in recreation activity.   Affect/Mood: Appropriate and Flat   Participation Level: Engaged   Participation Quality: Independent   Behavior: Calm and Cooperative   Speech/Thought Process: Coherent   Insight: Good   Judgement: Good   Modes of Intervention: Exploration, Music, Open Conversation, Rapport Building, and Socialization   Patient Response to Interventions:  Attentive, Engaged, Interested , and Receptive   Education Outcome:  Acknowledges education   Clinical Observations/Individualized Feedback: Natalie Edwards was active in their participation of session activities and group discussion. Pt interacted well with LRT and peers duration of session.    Plan: Continue to engage patient in RT group sessions 2-3x/week.   Rosina Lowenstein, LRT, CTRS 06/09/2023 4:35 PM

## 2023-06-09 NOTE — Progress Notes (Signed)
 [facility] MD Progress Note  06/09/2023 9:43 PM Natalie Edwards  MRN:  [id number]  Natalie Edwards is a 58 y.o. female admitted: Presented to the [facility] 05/22/2023  6:47 PM for cocaine overdose. She carries the psychiatric diagnoses of Major depressive disorder, recurrent without psychotic features, Cocaine abuse and previous suicide attempt, and has a past medical history of HTN, GERD. Her current presentation of recent cocaine overdose with past history of MDD and suicide attempts is most consistent with major depressive disorder, recurrent vs cocaine induced mood disorder. She meets criteria for inpatient psychiatric admission based on recent cocaine overdose and risk of harm to self.  Patient is admitted to the geropsychiatry unit for further stabilization   Subjective:  Chart reviewed, case discussed in multidisciplinary meeting, patient seen during rounds.  Patient is noted to be sitting in her room and crying.  She reports that she is done staying in the hospital and feels walls are closing in on her.  Provider had to help her process her emotions.  Eventually patient was able to acknowledge that she has no place to go and the minute she leaves the building she is homeless.  She reports feeling hopeless and worthless as none of her family or friends are ready to help her.  She reports feeling hopeless about the situation that even rehabs are not working out for her.  She denies SI/HI/plan.  She denies auditory/visual hallucinations.  Sleep: Fair  Appetite:  Fair  Past Psychiatric History: see h&P Family History:  Family History  Problem Relation Age of Onset   Cancer Maternal Uncle        Lung   Cancer Maternal Uncle        Lung   Cancer Maternal Uncle        Lung   Headache Neg Hx        "I don't think so"   Migraines Neg Hx        "I don't think so"   Social History:  Social History   Substance and Sexual Activity  Alcohol Use Yes   Comment: occasionally - last drink was July 4, 1 shot      Social History   Substance and Sexual Activity  Drug Use No    Social History   Socioeconomic History   Marital status: Single    Spouse name: Not on file   Number of children: 3   Years of education: Not on file   Highest education level: 12th grade  Occupational History   Occupation: environmental services at [employer]  Tobacco Use   Smoking status: Every Day    Current packs/day: 0.50    Average packs/day: 0.5 packs/day for 25.0 years (12.5 ttl pk-yrs)    Types: Cigarettes   Smokeless tobacco: Never   Tobacco comments:    Pt tried to quit - helps with anxiety     1 pack last patient 3 days   Vaping Use   Vaping status: Never Used  Substance and Sexual Activity   Alcohol use: Yes    Comment: occasionally - last drink was July 4, 1 shot   Drug use: No   Sexual activity: Yes    Comment: hysterectomy  Other Topics Concern   Not on file  Social History Narrative   Lives at home in an apartment. Her mother lives with her.    Right handed   Caffeine: drinks approx. 36 oz of pepsi per day. Sometimes drinks 2 cups of coffee in a day as  well.    Social Drivers of Health   Financial Resource Strain: Low Risk  (03/06/2023)   Overall Financial Resource Strain (CARDIA)    Difficulty of Paying Living Expenses: Not very hard  Food Insecurity: Patient Declined (05/26/2023)   Hunger Vital Sign    Worried About Running Out of Food in the Last Year: Patient declined    Ran Out of Food in the Last Year: Patient declined  Recent Concern: Food Insecurity - Food Insecurity Present (05/24/2023)   Hunger Vital Sign    Worried About Running Out of Food in the Last Year: Sometimes true    Ran Out of Food in the Last Year: Often true  Transportation Needs: No Transportation Needs (05/26/2023)   PRAPARE - Administrator, Civil Service (Medical): No    Lack of Transportation (Non-Medical): No  Recent Concern: Transportation Needs - Unmet Transportation Needs (05/24/2023)    PRAPARE - Administrator, Civil Service (Medical): Yes    Lack of Transportation (Non-Medical): No  Physical Activity: Sufficiently Active (05/22/2022)   Exercise Vital Sign    Days of Exercise per Week: 4 days    Minutes of Exercise per Session: 70 min  Stress: Stress Concern Present (03/06/2023)   [employer] of Occupational Health - Occupational Stress Questionnaire    Feeling of Stress : Very much  Social Connections: Socially Isolated (05/26/2023)   Social Connection and Isolation Panel [NHANES]    Frequency of Communication with Friends and Family: More than three times a week    Frequency of Social Gatherings with Friends and Family: More than three times a week    Attends Religious Services: Never    Database administrator or Organizations: No    Attends Engineer, structural: Never    Marital Status: Divorced   Past Medical History:  Past Medical History:  Diagnosis Date   Allergy    Anemia    Diverticulosis    Gall bladder stones 1993   Gastric ulcer    GERD (gastroesophageal reflux disease)    Hypertension    Renal mass     Past Surgical History:  Procedure Laterality Date   ABDOMINAL HYSTERECTOMY     BIOPSY  09/29/2022   Procedure: BIOPSY;  Surgeon: Napoleon Form, MD;  Location: [facility];  Service: Gastroenterology;;   CHOLECYSTECTOMY     COLONOSCOPY WITH ESOPHAGOGASTRODUODENOSCOPY (EGD)  04/07/2023   [facility]   ESOPHAGOGASTRODUODENOSCOPY (EGD) WITH PROPOFOL N/A 09/29/2022   Procedure: ESOPHAGOGASTRODUODENOSCOPY (EGD) WITH PROPOFOL;  Surgeon: Napoleon Form, MD;  Location: [facility];  Service: Gastroenterology;  Laterality: N/A;    Current Medications: Current Facility-Administered Medications  Medication Dose Route Frequency Provider Last Rate Last Admin   acetaminophen (TYLENOL) tablet 650 mg  650 mg Oral Q6H PRN Maryagnes Amos, FNP   650 mg at 06/04/23 1734   albuterol (PROVENTIL) (2.5 MG/3ML) 0.083%  nebulizer solution 2.5 mg  2.5 mg Nebulization Q2H PRN Starkes-Perry, Juel Burrow, FNP       alum & mag hydroxide-simeth (MAALOX/MYLANTA) 200-200-20 MG/5ML suspension 30 mL  30 mL Oral Q4H PRN Starkes-Perry, Juel Burrow, FNP       FLUoxetine (PROZAC) capsule 20 mg  20 mg Oral Daily Maryagnes Amos, FNP   20 mg at 06/09/23 0917   hydrochlorothiazide (HYDRODIURIL) tablet 25 mg  25 mg Oral Daily Delfino Lovett, MD   25 mg at 06/09/23 0917   magnesium hydroxide (MILK OF MAGNESIA) suspension 30 mL  30 mL Oral Daily PRN Maryagnes Amos, FNP       mirtazapine (REMERON) tablet 30 mg  30 mg Oral QHS Maryagnes Amos, FNP   30 mg at 06/09/23 2122   naphazoline-glycerin (CLEAR EYES REDNESS) ophth solution 1-2 drop  1-2 drop Both Eyes QID PRN Starkes-Perry, Juel Burrow, FNP       nicotine (NICODERM CQ - dosed in mg/24 hours) patch 21 mg  21 mg Transdermal Daily Verner Chol, MD   21 mg at 06/09/23 0916   OLANZapine (ZYPREXA) injection 5 mg  5 mg Intramuscular TID PRN Starkes-Perry, Juel Burrow, FNP       OLANZapine zydis (ZYPREXA) disintegrating tablet 5 mg  5 mg Oral TID PRN Maryagnes Amos, FNP       ondansetron (ZOFRAN) tablet 4 mg  4 mg Oral Q6H PRN Starkes-Perry, Juel Burrow, FNP       Or   ondansetron (ZOFRAN) injection 4 mg  4 mg Intravenous Q6H PRN Starkes-Perry, Juel Burrow, FNP       pantoprazole (PROTONIX) EC tablet 40 mg  40 mg Oral Daily Maryagnes Amos, FNP   40 mg at 06/09/23 0916   risperiDONE (RISPERDAL) tablet 0.5 mg  0.5 mg Oral Daily Maryagnes Amos, FNP   0.5 mg at 06/09/23 0916   traMADol (ULTRAM) tablet 50 mg  50 mg Oral Q8H PRN Verner Chol, MD   50 mg at 06/09/23 2122   traZODone (DESYREL) tablet 50 mg  50 mg Oral QHS Verner Chol, MD   50 mg at 06/09/23 2123    Lab Results:  No results found for this or any previous visit (from the past 48 hours).   Blood Alcohol level:  Lab Results  Component Value Date   ETH <10 05/22/2023   ETH <10 12/22/2022     Metabolic Disorder Labs: Lab Results  Component Value Date   HGBA1C 5.2 05/22/2023   MPG 102.54 05/22/2023   No results found for: "PROLACTIN" Lab Results  Component Value Date   CHOL 183 05/29/2023   TRIG 87 05/29/2023   HDL 63 05/29/2023   CHOLHDL 2.9 05/29/2023   VLDL 17 05/29/2023   LDLCALC 103 (H) 05/29/2023   LDLCALC 106 (H) 12/13/2021    Physical Findings: AIMS:  , ,  ,  ,    CIWA:    COWS:      Psychiatric Specialty Exam:  Presentation  General Appearance:  Appropriate for Environment; Casual  Eye Contact: Fleeting  Speech: Clear and Coherent  Speech Volume: Decreased    Mood and Affect  Mood: Depressed; Hopeless  Affect: Depressed; Tearful   Thought Process  Thought Processes: Linear  Descriptions of Associations:Intact  Orientation:Full (Time, Place and Person)  Thought Content:Logical  Hallucinations:Hallucinations: None  Ideas of Reference:None  Suicidal Thoughts:Suicidal Thoughts: No  Homicidal Thoughts:Homicidal Thoughts: No   Sensorium  Memory: Immediate Fair; Recent Fair; Remote Fair  Judgment: Impaired  Insight: Shallow   Executive Functions  Concentration: Fair  Attention Span: Fair  Recall: Fair  Fund of Knowledge: Fair  Language: Fair   Psychomotor Activity  Psychomotor Activity: Psychomotor Activity: Normal  Musculoskeletal: Strength & Muscle Tone: within normal limits Gait & Station: normal Assets  Assets: Manufacturing systems engineer; Desire for Improvement; Resilience    Physical Exam: Physical Exam Vitals and nursing note reviewed.  HENT:     Head: Normocephalic.     Right Ear: Tympanic membrane normal.     Nose: Nose normal.  Mouth/Throat:     Mouth: Mucous membranes are moist.  Cardiovascular:     Rate and Rhythm: Normal rate.     Pulses: Normal pulses.  Pulmonary:     Breath sounds: Normal breath sounds.  Abdominal:     General: Bowel sounds are normal.  Skin:     General: Skin is warm.  Neurological:     General: No focal deficit present.     Mental Status: She is alert.    ROS Blood pressure 105/72, pulse 92, temperature 98.8 F (37.1 C), resp. rate 18, height 5\' 4"  (1.626 m), weight 82.8 kg, SpO2 97%. Body mass index is 31.33 kg/m.  Diagnosis: Principal Problem:   MDD (major depressive disorder), recurrent episode, severe (HCC) Active Problems:   Essential hypertension   MDD (major depressive disorder), recurrent severe, without psychosis (HCC)   Ingestion of substance, intentional self-harm, sequela (HCC)   Cocaine abuse (HCC)   Pain and swelling of lower extremity  Clinical Decision Making: Patient was recently medically admitted after being found unresponsive in her house with possible overdose on her medications of cocaine.  Patient displays symptoms of depression, anhedonia, hopelessness in the context of multiple psychosocial stressors including death of her mom, homelessness, losing job etc. patient needs inpatient hospitalization for further stabilization.   Treatment Plan Summary:   Safety and Monitoring:             -- Involuntary admission to inpatient psychiatric unit for safety, stabilization and treatment             -- Daily contact with patient to assess and evaluate symptoms and progress in treatment             -- Patient's case to be discussed in multi-disciplinary team meeting             -- Observation Level: q15 minute checks             -- Vital signs:  q12 hours             -- Precautions: suicide, elopement, and assault   2. Psychiatric Diagnoses and Treatment:               Prozac 20 mg daily, Remeron 30 mg nightly Risperdal 0.5 mg daily, trazodone 50 mg nightly for insomnia   -- The risks/benefits/side-effects/alternatives to this medication were discussed in detail with the patient and time was given for questions. The patient consents to medication trial.                -- Metabolic profile and EKG  monitoring obtained while on an atypical antipsychotic (BMI: Lipid Panel: HbgA1c: QTc:)              -- Encouraged patient to participate in unit milieu and in scheduled group therapies                            3. Medical Issues Being Addressed:  Bilateral pitting edema of lower extremities-resolved   4. Discharge Planning:              -- Social work and case management to assist with discharge planning and identification of hospital follow-up needs prior to discharge             -- Estimated LOS: 5-7 days             -- Discharge Concerns: Need to establish a safety plan; Medication compliance and  effectiveness             -- Discharge Goals: Return home with outpatient referrals follow ups   Physician Treatment Plan for Primary Diagnosis: MDD (major depressive disorder), recurrent episode, severe (HCC) Long Term Goal(s): Improvement in symptoms so as ready for discharge   Short Term Goals: Ability to identify changes in lifestyle to reduce recurrence of condition will improve, Ability to verbalize feelings will improve, Ability to disclose and discuss suicidal ideas, Ability to demonstrate self-control will improve, and Ability to identify and develop effective coping behaviors will improve   Physician Treatment Plan for Secondary Diagnosis: Principal Problem:   MDD (major depressive disorder), recurrent episode, severe (HCC)   Long Term Goal(s): Improvement in symptoms so as ready for discharge   Short Term Goals: Ability to identify changes in lifestyle to reduce recurrence of condition will improve, Ability to verbalize feelings will improve, Ability to disclose and discuss suicidal ideas, Ability to demonstrate self-control will improve, Ability to identify and develop effective coping behaviors will improve, Ability to maintain clinical measurements within normal limits will improve, Compliance with prescribed medications will improve, and Ability to identify triggers associated with  substance abuse/mental health issues will improve   Verner Chol, MD 06/09/2023, 9:43 PM

## 2023-06-09 NOTE — [facility] Counselor (Signed)
 CSW contacted [facility] at [phone] to check on pt's bed.   They report their clinician is still reviewing it.   Joslyn Devon reports that she has raised the issue up to her supervisor.   Joslyn Devon took CSW works cell and reports she will call back once they have a decision.   Reynaldo Minium, MSW, [place] 06/09/2023 2:20 PM

## 2023-06-09 NOTE — [facility] Counselor (Signed)
 CSW contacted [facility] per pt's request as pt was told that they need a few more days.   [facility] reports that they did not give an estimated time, and CANNOT give an estimated time for acceptance.   CSW asked to speak with a  supervisor because CSW has attempted to call and get an update multiple times and is being told different things by different intake specialist.   Joslyn Devon reports the supervisor is not in, but can be reached tomorrow morning, she reports the supervisors name is Richie, extension [phone].   CSW will give [facility] a call back in the morning.    Reynaldo Minium, MSW, [place] 06/09/2023 3:53 PM

## 2023-06-09 NOTE — Progress Notes (Signed)
 Patient pleasant and cooperative.  Sad affect. Patient is frustrated with the time it is taking to find placement.  Denies SI/HI and AVH.  Denies anxiety and depression.  Pain rated 7/10.  Reports she slept well.    Compliant with scheduled medications.  PRN pain medication given as ordered. Patient is present in the milieu. Appropriate interaction with peers and staff.

## 2023-06-09 NOTE — Group Note (Signed)
 Date:  06/09/2023 Time:  12:02 PM  Group Topic/Focus:  Goals Group/Community Unit Expectations:   The focus of this group is to help patients establish daily goals to achieve during treatment and discuss how the patient can incorporate goal setting into their daily lives to aide in recovery. Also going over  The unit expectations and rules    Participation Level:  Did Not Attend  Participation Quality:    Affect:    Cognitive:    Insight:   Engagement in Group:    Modes of Intervention:    Additional Comments:  Did not attend   Rachal Dvorsky T Clayton Bosserman 06/09/2023, 12:02 PM

## 2023-06-10 MED ORDER — PROPRANOLOL HCL 10 MG PO TABS
5.0000 mg | ORAL_TABLET | Freq: Two times a day (BID) | ORAL | Status: DC | PRN
Start: 2023-06-10 — End: 2023-06-12
  Administered 2023-06-10: 5 mg via ORAL
  Filled 2023-06-10 (×2): qty 0.5

## 2023-06-10 NOTE — Progress Notes (Addendum)
 [facility] MD Progress Note  06/10/2023 10:32 AM Natalie Edwards  MRN:  [id number]  Natalie Edwards is a 58 y.o. female admitted: Presented to the [facility] 05/22/2023  6:47 PM for cocaine overdose. She carries the psychiatric diagnoses of Major depressive disorder, recurrent without psychotic features, Cocaine abuse and previous suicide attempt, and has a past medical history of HTN, GERD. Her current presentation of recent cocaine overdose with past history of MDD and suicide attempts is most consistent with major depressive disorder, recurrent vs cocaine induced mood disorder. She meets criteria for inpatient psychiatric admission based on recent cocaine overdose and risk of harm to self.  Patient is admitted to the geropsychiatry unit for further stabilization   Subjective:  Chart reviewed, case discussed in multidisciplinary meeting, patient seen during rounds.  Today on interview patient is noted to be walking in the hallways.  She reports that she is upset about not able to leave and not able to know if she is accepted to [facility].  She reports that she is looking forward to move forward in her life.  She denies suicidal/homicidal ideation/plan.  She denies auditory/visual hallucinations.  Patient had not displayed any mood lability or psychosis and provider discussed discontinuing Risperdal given the metabolic load with antipsychotics.  Patient agreed with the plan. Sleep: Fair  Appetite:  Fair  Past Psychiatric History: see h&P Family History:  Family History  Problem Relation Age of Onset   Cancer Maternal Uncle        Lung   Cancer Maternal Uncle        Lung   Cancer Maternal Uncle        Lung   Headache Neg Hx        "I don't think so"   Migraines Neg Hx        "I don't think so"   Social History:  Social History   Substance and Sexual Activity  Alcohol Use Yes   Comment: occasionally - last drink was July 4, 1 shot     Social History   Substance and Sexual Activity  Drug Use No    Social  History   Socioeconomic History   Marital status: Single    Spouse name: Not on file   Number of children: 3   Years of education: Not on file   Highest education level: 12th grade  Occupational History   Occupation: environmental services at [employer]  Tobacco Use   Smoking status: Every Day    Current packs/day: 0.50    Average packs/day: 0.5 packs/day for 25.0 years (12.5 ttl pk-yrs)    Types: Cigarettes   Smokeless tobacco: Never   Tobacco comments:    Pt tried to quit - helps with anxiety     1 pack last patient 3 days   Vaping Use   Vaping status: Never Used  Substance and Sexual Activity   Alcohol use: Yes    Comment: occasionally - last drink was July 4, 1 shot   Drug use: No   Sexual activity: Yes    Comment: hysterectomy  Other Topics Concern   Not on file  Social History Narrative   Lives at home in an apartment. Her mother lives with her.    Right handed   Caffeine: drinks approx. 36 oz of pepsi per day. Sometimes drinks 2 cups of coffee in a day as well.    Social Drivers of Corporate investment banker Strain: Low Risk  (03/06/2023)   Overall Physicist, medical Strain (  CARDIA)    Difficulty of Paying Living Expenses: Not very hard  Food Insecurity: Patient Declined (05/26/2023)   Hunger Vital Sign    Worried About Running Out of Food in the Last Year: Patient declined    Ran Out of Food in the Last Year: Patient declined  Recent Concern: Food Insecurity - Food Insecurity Present (05/24/2023)   Hunger Vital Sign    Worried About Running Out of Food in the Last Year: Sometimes true    Ran Out of Food in the Last Year: Often true  Transportation Needs: No Transportation Needs (05/26/2023)   PRAPARE - Administrator, Civil Service (Medical): No    Lack of Transportation (Non-Medical): No  Recent Concern: Transportation Needs - Unmet Transportation Needs (05/24/2023)   PRAPARE - Administrator, Civil Service (Medical): Yes    Lack of  Transportation (Non-Medical): No  Physical Activity: Sufficiently Active (05/22/2022)   Exercise Vital Sign    Days of Exercise per Week: 4 days    Minutes of Exercise per Session: 70 min  Stress: Stress Concern Present (03/06/2023)   [employer] of Occupational Health - Occupational Stress Questionnaire    Feeling of Stress : Very much  Social Connections: Socially Isolated (05/26/2023)   Social Connection and Isolation Panel [NHANES]    Frequency of Communication with Friends and Family: More than three times a week    Frequency of Social Gatherings with Friends and Family: More than three times a week    Attends Religious Services: Never    Database administrator or Organizations: No    Attends Engineer, structural: Never    Marital Status: Divorced   Past Medical History:  Past Medical History:  Diagnosis Date   Allergy    Anemia    Diverticulosis    Gall bladder stones 1993   Gastric ulcer    GERD (gastroesophageal reflux disease)    Hypertension    Renal mass     Past Surgical History:  Procedure Laterality Date   ABDOMINAL HYSTERECTOMY     BIOPSY  09/29/2022   Procedure: BIOPSY;  Surgeon: Napoleon Form, MD;  Location: [facility];  Service: Gastroenterology;;   CHOLECYSTECTOMY     COLONOSCOPY WITH ESOPHAGOGASTRODUODENOSCOPY (EGD)  04/07/2023   [facility]   ESOPHAGOGASTRODUODENOSCOPY (EGD) WITH PROPOFOL N/A 09/29/2022   Procedure: ESOPHAGOGASTRODUODENOSCOPY (EGD) WITH PROPOFOL;  Surgeon: Napoleon Form, MD;  Location: [facility];  Service: Gastroenterology;  Laterality: N/A;    Current Medications: Current Facility-Administered Medications  Medication Dose Route Frequency Provider Last Rate Last Admin   acetaminophen (TYLENOL) tablet 650 mg  650 mg Oral Q6H PRN Maryagnes Amos, FNP   650 mg at 06/04/23 1734   albuterol (PROVENTIL) (2.5 MG/3ML) 0.083% nebulizer solution 2.5 mg  2.5 mg Nebulization Q2H PRN Starkes-Perry, Juel Burrow,  FNP       alum & mag hydroxide-simeth (MAALOX/MYLANTA) 200-200-20 MG/5ML suspension 30 mL  30 mL Oral Q4H PRN Starkes-Perry, Juel Burrow, FNP       FLUoxetine (PROZAC) capsule 20 mg  20 mg Oral Daily Maryagnes Amos, FNP   20 mg at 06/09/23 2130   hydrochlorothiazide (HYDRODIURIL) tablet 25 mg  25 mg Oral Daily Delfino Lovett, MD   25 mg at 06/09/23 8657   magnesium hydroxide (MILK OF MAGNESIA) suspension 30 mL  30 mL Oral Daily PRN Maryagnes Amos, FNP       mirtazapine (REMERON) tablet 30 mg  30  mg Oral QHS Maryagnes Amos, FNP   30 mg at 06/09/23 2122   naphazoline-glycerin (CLEAR EYES REDNESS) ophth solution 1-2 drop  1-2 drop Both Eyes QID PRN Starkes-Perry, Juel Burrow, FNP       nicotine (NICODERM CQ - dosed in mg/24 hours) patch 21 mg  21 mg Transdermal Daily Verner Chol, MD   21 mg at 06/09/23 0916   OLANZapine (ZYPREXA) injection 5 mg  5 mg Intramuscular TID PRN Starkes-Perry, Juel Burrow, FNP       OLANZapine zydis (ZYPREXA) disintegrating tablet 5 mg  5 mg Oral TID PRN Maryagnes Amos, FNP       ondansetron (ZOFRAN) tablet 4 mg  4 mg Oral Q6H PRN Starkes-Perry, Juel Burrow, FNP       Or   ondansetron (ZOFRAN) injection 4 mg  4 mg Intravenous Q6H PRN Starkes-Perry, Juel Burrow, FNP       pantoprazole (PROTONIX) EC tablet 40 mg  40 mg Oral Daily Maryagnes Amos, FNP   40 mg at 06/09/23 0916   traMADol (ULTRAM) tablet 50 mg  50 mg Oral Q8H PRN Verner Chol, MD   50 mg at 06/09/23 2122   traZODone (DESYREL) tablet 50 mg  50 mg Oral QHS Verner Chol, MD   50 mg at 06/09/23 2123    Lab Results:  No results found for this or any previous visit (from the past 48 hours).   Blood Alcohol level:  Lab Results  Component Value Date   ETH <10 05/22/2023   ETH <10 12/22/2022    Metabolic Disorder Labs: Lab Results  Component Value Date   HGBA1C 5.2 05/22/2023   MPG 102.54 05/22/2023   No results found for: "PROLACTIN" Lab Results  Component Value Date   CHOL  183 05/29/2023   TRIG 87 05/29/2023   HDL 63 05/29/2023   CHOLHDL 2.9 05/29/2023   VLDL 17 05/29/2023   LDLCALC 103 (H) 05/29/2023   LDLCALC 106 (H) 12/13/2021    Physical Findings: AIMS:  , ,  ,  ,    CIWA:    COWS:      Psychiatric Specialty Exam:  Presentation  General Appearance:  Appropriate for Environment; Casual  Eye Contact: Fair  Speech: Clear and Coherent  Speech Volume: Normal    Mood and Affect  Mood: Euthymic  Affect: Appropriate   Thought Process  Thought Processes: Coherent  Descriptions of Associations:Intact  Orientation:Full (Time, Place and Person)  Thought Content:Logical  Hallucinations:Hallucinations: None  Ideas of Reference:None  Suicidal Thoughts:Suicidal Thoughts: No  Homicidal Thoughts:Homicidal Thoughts: No   Sensorium  Memory: Immediate Fair; Recent Fair; Remote Fair  Judgment: Impaired  Insight: Shallow   Executive Functions  Concentration: Fair  Attention Span: Fair  Recall: [employer] of Knowledge: Fair  Language: Fair   Psychomotor Activity  Psychomotor Activity: Psychomotor Activity: Normal  Musculoskeletal: Strength & Muscle Tone: within normal limits Gait & Station: normal Assets  Assets: Manufacturing systems engineer; Financial Resources/Insurance; Physical Health; Resilience    Physical Exam: Physical Exam Vitals and nursing note reviewed.  HENT:     Head: Normocephalic.     Right Ear: Tympanic membrane normal.     Nose: Nose normal.     Mouth/Throat:     Mouth: Mucous membranes are moist.  Cardiovascular:     Rate and Rhythm: Normal rate.     Pulses: Normal pulses.  Pulmonary:     Breath sounds: Normal breath sounds.  Abdominal:     General:  Bowel sounds are normal.  Skin:    General: Skin is warm.  Neurological:     General: No focal deficit present.     Mental Status: She is alert.    ROS Blood pressure 112/69, pulse 89, temperature 98.4 F (36.9 C), resp. rate  18, height 5\' 4"  (1.626 m), weight 82.8 kg, SpO2 97%. Body mass index is 31.33 kg/m.  Diagnosis: Principal Problem:   MDD (major depressive disorder), recurrent episode, severe (HCC) Active Problems:   Essential hypertension   MDD (major depressive disorder), recurrent severe, without psychosis (HCC)   Ingestion of substance, intentional self-harm, sequela (HCC)   Cocaine abuse (HCC)   Pain and swelling of lower extremity  Clinical Decision Making: Patient was recently medically admitted after being found unresponsive in her house with possible overdose on her medications of cocaine.  Patient was hospitalized to inpatient psychiatric facility for stabilization.  Since her admission patient has responded very well to treatment and has been psychiatrically stable.  She remains future oriented and wants to transition to a rehab to maintain her severity and work on her goals.   Treatment Plan Summary:   Safety and Monitoring:             -- Voluntary admission to inpatient psychiatric unit for safety, stabilization and treatment             -- Daily contact with patient to assess and evaluate symptoms and progress in treatment             -- Patient's case to be discussed in multi-disciplinary team meeting             -- Observation Level: q15 minute checks             -- Vital signs:  q12 hours             -- Precautions: suicide, elopement, and assault   2. Psychiatric Diagnoses and Treatment:             06/10/2023-Risperdal 0.5 mg nightly was discontinued as patient has not been displaying any symptoms of psychosis or mood lability.  Considering the metabolic load and her medical problems it is safe to discontinue Risperdal. Prozac 20 mg daily, Remeron 30 mg nightly  trazodone 50 mg nightly for insomnia   -- The risks/benefits/side-effects/alternatives to this medication were discussed in detail with the patient and time was given for questions. The patient consents to medication trial.                 -- Metabolic profile and EKG monitoring obtained while on an atypical antipsychotic (BMI: Lipid Panel: HbgA1c: QTc:)              -- Encouraged patient to participate in unit milieu and in scheduled group therapies                            3. Medical Issues Being Addressed:  Bilateral pitting edema of lower extremities-resolved   4. Discharge Planning:              -- Social work and case management to assist with discharge planning and identification of hospital follow-up needs prior to discharge             -- Estimated LOS: 5-7 days             -- Discharge Concerns: Need to establish a safety plan; Medication compliance and effectiveness             --  Discharge Goals: Return home with outpatient referrals follow ups   Physician Treatment Plan for Primary Diagnosis: MDD (major depressive disorder), recurrent episode, severe (HCC) Long Term Goal(s): Improvement in symptoms so as ready for discharge   Short Term Goals: Ability to identify changes in lifestyle to reduce recurrence of condition will improve, Ability to verbalize feelings will improve, Ability to disclose and discuss suicidal ideas, Ability to demonstrate self-control will improve, and Ability to identify and develop effective coping behaviors will improve   Physician Treatment Plan for Secondary Diagnosis: Principal Problem:   MDD (major depressive disorder), recurrent episode, severe (HCC)   Long Term Goal(s): Improvement in symptoms so as ready for discharge   Short Term Goals: Ability to identify changes in lifestyle to reduce recurrence of condition will improve, Ability to verbalize feelings will improve, Ability to disclose and discuss suicidal ideas, Ability to demonstrate self-control will improve, Ability to identify and develop effective coping behaviors will improve, Ability to maintain clinical measurements within normal limits will improve, Compliance with prescribed medications will improve, and  Ability to identify triggers associated with substance abuse/mental health issues will improve   Verner Chol, MD 06/10/2023, 10:32 AM

## 2023-06-10 NOTE — Group Note (Signed)
 Date:  06/10/2023 Time:  8:51 PM  Group Topic/Focus:  Identifying Needs:   The focus of this group is to help patients identify their personal needs that have been historically problematic and identify healthy behaviors to address their needs.    Participation Level:  Active  Participation Quality:  Appropriate  Affect:  Appropriate  Cognitive:  Appropriate  Insight: Appropriate  Engagement in Group:  Engaged  Modes of Intervention:  Education  Additional Comments:    Garry Heater 06/10/2023, 8:51 PM

## 2023-06-10 NOTE — Progress Notes (Signed)
   06/10/23 1400  Psych Admission Type (Psych Patients Only)  Admission Status Voluntary  Psychosocial Assessment  Patient Complaints None  Eye Contact Fair  Facial Expression Anxious  Affect Anxious  Speech Logical/coherent  Interaction Assertive  Motor Activity Slow  Appearance/Hygiene In scrubs  Behavior Characteristics Cooperative  Mood Anxious  Thought Process  Coherency WDL  Content WDL  Delusions None reported or observed  Perception WDL  Hallucination None reported or observed  Judgment Impaired  Confusion None  Danger to Self  Current suicidal ideation? Denies  Agreement Not to Harm Self Yes  Description of Agreement verbal  Danger to Others  Danger to Others None reported or observed

## 2023-06-10 NOTE — Group Note (Signed)
 Date:  06/10/2023 Time:  1:05 AM  Group Topic/Focus:  Wrap-Up Group:   The focus of this group is to help patients review their daily goal of treatment and discuss progress on daily workbooks.    Participation Level:  Active  Participation Quality:  Appropriate  Affect:  Appropriate  Cognitive:  Appropriate  Insight: Appropriate  Engagement in Group:  Engaged  Modes of Intervention:  Discussion  Additional Comments:    Maeola Harman 06/10/2023, 1:05 AM

## 2023-06-10 NOTE — BHH Counselor (Signed)
 [facility] referral sent on patient's behalf.   Patient has been given instructions to complete her phone screening.   CSW team to continue to assess.    Reymundo Poll, MSW, LCSWA 06/10/2023 12:32 PM

## 2023-06-10 NOTE — BHH Counselor (Signed)
 CSW sent note to [facility] per their request stating that pt will be taken off Risperdal and is stable without Risperdal.   CSW then contacted [facility] to assist with placement for pt if [facility] does not work out  CSW called [facility] and was transferred to [employer], CSW unable to reach, left HIPAA compliant VM requesting return call.   CSW then called [facility] after receiving confirmation of receipt of note about Risperdal.   [facility] reports they have received it, and that they will call CSW once they have a determination.   Reynaldo Minium, MSW, [place] 06/10/2023 10:57 AM

## 2023-06-10 NOTE — Progress Notes (Signed)
   06/09/23 2200  Psych Admission Type (Psych Patients Only)  Admission Status Voluntary  Psychosocial Assessment  Patient Complaints None  Eye Contact Fair  Facial Expression Anxious  Affect Anxious  Speech Logical/coherent  Interaction Assertive  Motor Activity Slow  Appearance/Hygiene In scrubs  Behavior Characteristics Appropriate to situation  Mood Anxious  Thought Process  Coherency WDL  Content WDL  Delusions None reported or observed  Perception WDL  Hallucination None reported or observed  Judgment Impaired  Confusion None  Danger to Self  Current suicidal ideation? Denies   Pt alert and oriented. Pt states she's ready to leave. Endorses anxiety 8/10. Po medications given as scheduled. C/o abd pain. PRN given for pain/discomfort. Q 15 min checks maintained for safety. Pt remains safe on the unit.

## 2023-06-10 NOTE — Plan of Care (Signed)
  Problem: Activity: Goal: Interest or engagement in leisure activities will improve Outcome: Not Progressing   Problem: Coping: Goal: Coping ability will improve Outcome: Progressing

## 2023-06-10 NOTE — BHH Counselor (Signed)
 CSW received call from [facility] that pt has been accepted into the program for Sunday at 12:00 PM.   Address is [address], [place], [address].   They report pt needs a 30 to 60 day supply of medications, pill in bottle.   CSW informed pt and provider.   Reynaldo Minium, MSW, [place] 06/10/2023 2:36 PM

## 2023-06-10 NOTE — BHH Counselor (Signed)
 CSW received call from [facility]. [facility] reports that pt cannot  come to [facility] on anti-psychotics. CSW will inform provider and pt.    Reynaldo Minium, MSW, [place] 06/10/2023 9:13 AM

## 2023-06-10 NOTE — Group Note (Signed)
 Therapy Group Note  Group Topic:  Standing therex, fall recovery Group Date: 06/10/2023 Start Time: 1315 End Time: 1345 Facilitators: Laury Axon, PT     Group Description: Group discussed impact of balance on safety and independence with functional tasks.  Identified and discussed any self-perceived balance deficits to personalize information.  Discussed and reviewed strategies to address/improve balance deficits: use of assist devices, activity pacing/energy conservation, environment/home safety modifications, focusing attention/minimizing distraction.  Reviewed and participated with standing LE therex designed to target dynamic balance reactions and LE strength/stability (including heel/toe raises, mini squats, marching, hip abduct/adduct, HS curls, bilat lateral stepping, forward/backward stepping and braiding).  Allowed time for questions and further discussion on any balance or mobility concerns/needs.  Also reviewed fall recovery technique with transition from quadruped to tall kneeling to half kneeling at table/chair for external stabilization.  Therapist demonstrated technique with active discussion, problem-solving in technique from group participants.   Therapeutic Goal(s): Identify and discuss any individual balance deficits and functional implications. Identify and discuss any environmental/home safety modifications that can optimize balance and safety for mobility within the home. Demonstrate understanding and performance of standing therex designed to target dynamic balance deficits.   Individual Participation:  Did not attend.      Participation Level: Did not attend.  Participation Quality:   Behavior:   Speech/Thought Process:   Affect/Mood:   Insight:   Judgement:   Individualization:   Modes of Intervention:   Patient Response to Interventions:    Plan: Continue to engage patient in PT/OT groups 1-2 x/week.  Mkayla Steele H. Manson Passey, PT, DPT, NCS 06/10/23, 2:04  PM [phone]

## 2023-06-10 NOTE — Progress Notes (Signed)
   06/10/23 0615  15 Minute Checks  Location Bedroom  Visual Appearance Calm  Behavior Sleeping  Sleep (Behavioral Health Patients Only)  Calculate sleep? (Click Yes once per 24 hr at 0600 safety check) Yes  Documented sleep last 24 hours 9.25

## 2023-06-10 NOTE — BHH Counselor (Signed)
 CSW contacted [facility], they report they have availability.   CSW gave pt phone number for [facility] and [facility].   CSW contacted [facility] to inquire about intake process.   [facility] reports that they need to do an phone interview with pt,  CSW provided intake coordinator with pt's name, phone number and pt code to complete intake interview per their request.   Reynaldo Minium, MSW, [facility] 06/10/2023 2:34 PM

## 2023-06-10 NOTE — BHH Counselor (Signed)
 CSW received call from pt's cousin Harden Mo,   CSW received verbal permission from pt to talk with Asencion Islam in front of Jeoffrey Massed, RN and Marlou Sa, Charity fundraiser.   Asencion Islam reports that she feels pt needs to go to treatment and that she doesn't feel comfortable with pt coming to her house   Asencion Islam reports she is the one who found pt, but that she also has a husband, and states "if it was just me I would take her, but it's not"   [name] states, " I'm afraid that she won't make it to treatment and that she won't get what she needs"   Asencion Islam also states, "Senai is a nice person, she's an only child and she wants things to happen like yesterday. I know it's frustrating to her . I just don't want her to repeat this"   CSW provided support and reassurance.   Reynaldo Minium, MSW, [place] 06/10/2023 3:03 PM

## 2023-06-11 MED ORDER — ALBUTEROL SULFATE (2.5 MG/3ML) 0.083% IN NEBU: 2.5000 mg | INHALATION_SOLUTION | RESPIRATORY_TRACT | 12 refills | Status: DC | PRN

## 2023-06-11 MED ORDER — NICOTINE 21 MG/24HR TD PT24: 21.0000 mg | MEDICATED_PATCH | Freq: Every day | TRANSDERMAL | 0 refills | Status: DC

## 2023-06-11 MED ORDER — HYDROCHLOROTHIAZIDE 25 MG PO TABS: 25.0000 mg | ORAL_TABLET | Freq: Every day | ORAL | 0 refills | Status: DC

## 2023-06-11 MED ORDER — MIRTAZAPINE 30 MG PO TABS: 30.0000 mg | ORAL_TABLET | Freq: Every day | ORAL | 0 refills | Status: DC

## 2023-06-11 MED ORDER — NAPHAZOLINE-GLYCERIN 0.012-0.25 % OP SOLN: 1.0000 [drp] | Freq: Four times a day (QID) | OPHTHALMIC | 0 refills | Status: DC | PRN

## 2023-06-11 MED ORDER — AMLODIPINE BESYLATE 10 MG PO TABS
10.0000 mg | ORAL_TABLET | Freq: Every day | ORAL | 0 refills | Status: DC
Start: 2023-06-11 — End: 2023-10-08

## 2023-06-11 MED ORDER — DICYCLOMINE HCL 20 MG PO TABS: 20.0000 mg | ORAL_TABLET | Freq: Three times a day (TID) | ORAL | 0 refills | Status: DC

## 2023-06-11 MED ORDER — FLUOXETINE HCL 20 MG PO CAPS: 20.0000 mg | ORAL_CAPSULE | Freq: Every day | ORAL | 3 refills | Status: DC

## 2023-06-11 MED ORDER — TRAZODONE HCL 50 MG PO TABS: 50.0000 mg | ORAL_TABLET | Freq: Every day | ORAL | 0 refills | Status: DC

## 2023-06-11 MED ORDER — NAPHAZOLINE-GLYCERIN 0.012-0.25 % OP SOLN
1.0000 [drp] | Freq: Four times a day (QID) | OPHTHALMIC | 0 refills | Status: DC | PRN
Start: 2023-06-11 — End: 2023-06-11

## 2023-06-11 MED ORDER — PANTOPRAZOLE SODIUM 40 MG PO TBEC: 40.0000 mg | DELAYED_RELEASE_TABLET | Freq: Every day | ORAL | 3 refills | Status: DC

## 2023-06-11 NOTE — Care Management Important Message (Signed)
 Important Message  Patient Details  Name: Natalie Edwards MRN: [id number] Date of Birth: 12-Mar-1966   Important Message Given:  Yes - Medicare IM     Elza Rafter, LCSWA 06/11/2023, 11:22 AM

## 2023-06-11 NOTE — Group Note (Signed)
 Date:  06/11/2023 Time:  10:39 AM  Group Topic/Focus:  Self Care:   The focus of this group is to help patients understand the importance of self-care in order to improve or restore emotional, physical, spiritual, interpersonal, and financial health.    Participation Level:  Active  Participation Quality:  Appropriate  Affect:  Appropriate  Cognitive:  Appropriate  Insight: Appropriate  Engagement in Group:  Engaged  Modes of Intervention:  Discussion   Ardelle Anton 06/11/2023, 10:39 AM

## 2023-06-11 NOTE — Group Note (Signed)
 Recreation Therapy Group Note   Group Topic:Emotion Expression  Group Date: 06/11/2023 Start Time: 1330 End Time: 1430 Facilitators: Rosina Lowenstein, LRT, CTRS Location: Courtyard  Group Description: Merchant navy officer. Patients drew a laminated card out of a bag that had a word or phrase on it. Pt encouraged to speak about a time in their life or fond memory that specifically relates to the word they chose out of the bag. An example would be: "parenthood, meals, siblings, travel, or home".  LRT prompted following questions and encouraged contribution from peers to increase communication.   Goal Area(s) Addressed: Patient will increase verbal communication by conversing with peers. Patient will contribute to group discussion with minimal prompting. Patient will reminisce a positive memory or moment in their life.   Affect/Mood: N/A   Participation Level: Did not attend    Clinical Observations/Individualized Feedback: Patient did not attend group.   Plan: Continue to engage patient in RT group sessions 2-3x/week.   Rosina Lowenstein, LRT, CTRS 06/11/2023 5:14 PM

## 2023-06-11 NOTE — BHH Counselor (Signed)
 CSW contacted [facility] to confirm that pt's bed was scheduled.   They report she is scheduled to go to [facility]) @10 :30 AM   CSW will confirm with provider.    Reynaldo Minium, MSW, [place] 06/11/2023 10:51 AM

## 2023-06-11 NOTE — Discharge Summary (Signed)
 Physician Discharge Summary Note  Patient:  Natalie Edwards is an 58 y.o., female MRN:  [id number] DOB:  1965/04/22 Patient phone:  [phone] (home)  Patient address:   [address] [place] [address],    Date of Admission:  05/26/2023 Date of Discharge: 06/11/23  Reason for Admission:   Natalie Edwards is a 58 y.o. female admitted: Presented to the [facility] 05/22/2023  6:47 PM for cocaine overdose. She carries the psychiatric diagnoses of Major depressive disorder, recurrent without psychotic features, Cocaine abuse and previous suicide attempt, and has a past medical history of HTN, GERD. Her current presentation of recent cocaine overdose with past history of MDD and suicide attempts is most consistent with major depressive disorder, recurrent vs cocaine induced mood disorder. She meets criteria for inpatient psychiatric admission based on recent cocaine overdose and risk of harm to self.  Patient is admitted to the geropsychiatry unit for further stabilization  Principal Problem: MDD (major depressive disorder), recurrent episode, severe (HCC) Discharge Diagnoses: Principal Problem:   MDD (major depressive disorder), recurrent episode, severe (HCC) Active Problems:   Essential hypertension   MDD (major depressive disorder), recurrent severe, without psychosis (HCC)   Ingestion of substance, intentional self-harm, sequela (HCC)   Cocaine abuse (HCC)   Pain and swelling of lower extremity   Past Psychiatric History: see h&p  Family Psychiatric  History: see h&p Social History:  Social History   Substance and Sexual Activity  Alcohol Use Yes   Comment: occasionally - last drink was July 4, 1 shot     Social History   Substance and Sexual Activity  Drug Use No    Social History   Socioeconomic History   Marital status: Single    Spouse name: Not on file   Number of children: 3   Years of education: Not on file   Highest education level: 12th grade  Occupational  History   Occupation: environmental services at [employer]  Tobacco Use   Smoking status: Every Day    Current packs/day: 0.50    Average packs/day: 0.5 packs/day for 25.0 years (12.5 ttl pk-yrs)    Types: Cigarettes   Smokeless tobacco: Never   Tobacco comments:    Pt tried to quit - helps with anxiety     1 pack last patient 3 days   Vaping Use   Vaping status: Never Used  Substance and Sexual Activity   Alcohol use: Yes    Comment: occasionally - last drink was July 4, 1 shot   Drug use: No   Sexual activity: Yes    Comment: hysterectomy  Other Topics Concern   Not on file  Social History Narrative   Lives at home in an apartment. Her mother lives with her.    Right handed   Caffeine: drinks approx. 36 oz of pepsi per day. Sometimes drinks 2 cups of coffee in a day as well.    Social Drivers of Corporate investment banker Strain: Low Risk  (03/06/2023)   Overall Financial Resource Strain (CARDIA)    Difficulty of Paying Living Expenses: Not very hard  Food Insecurity: Patient Declined (05/26/2023)   Hunger Vital Sign    Worried About Running Out of Food in the Last Year: Patient declined    Ran Out of Food in the Last Year: Patient declined  Recent Concern: Food Insecurity - Food Insecurity Present (05/24/2023)   Hunger Vital Sign    Worried About Running Out of Food in the Last Year:  Sometimes true    Ran Out of Food in the Last Year: Often true  Transportation Needs: No Transportation Needs (05/26/2023)   PRAPARE - Administrator, Civil Service (Medical): No    Lack of Transportation (Non-Medical): No  Recent Concern: Transportation Needs - Unmet Transportation Needs (05/24/2023)   PRAPARE - Administrator, Civil Service (Medical): Yes    Lack of Transportation (Non-Medical): No  Physical Activity: Sufficiently Active (05/22/2022)   Exercise Vital Sign    Days of Exercise per Week: 4 days    Minutes of Exercise per Session: 70 min  Stress: Stress  Concern Present (03/06/2023)   [employer] of Occupational Health - Occupational Stress Questionnaire    Feeling of Stress : Very much  Social Connections: Socially Isolated (05/26/2023)   Social Connection and Isolation Panel [NHANES]    Frequency of Communication with Friends and Family: More than three times a week    Frequency of Social Gatherings with Friends and Family: More than three times a week    Attends Religious Services: Never    Database administrator or Organizations: No    Attends Engineer, structural: Never    Marital Status: Divorced   Past Medical History:  Past Medical History:  Diagnosis Date   Allergy    Anemia    Diverticulosis    Gall bladder stones 1993   Gastric ulcer    GERD (gastroesophageal reflux disease)    Hypertension    Renal mass     Past Surgical History:  Procedure Laterality Date   ABDOMINAL HYSTERECTOMY     BIOPSY  09/29/2022   Procedure: BIOPSY;  Surgeon: Napoleon Form, MD;  Location: [facility];  Service: Gastroenterology;;   CHOLECYSTECTOMY     COLONOSCOPY WITH ESOPHAGOGASTRODUODENOSCOPY (EGD)  04/07/2023   [facility]   ESOPHAGOGASTRODUODENOSCOPY (EGD) WITH PROPOFOL N/A 09/29/2022   Procedure: ESOPHAGOGASTRODUODENOSCOPY (EGD) WITH PROPOFOL;  Surgeon: Napoleon Form, MD;  Location: [facility];  Service: Gastroenterology;  Laterality: N/A;   Family History:  Family History  Problem Relation Age of Onset   Cancer Maternal Uncle        Lung   Cancer Maternal Uncle        Lung   Cancer Maternal Uncle        Lung   Headache Neg Hx        "I don't think so"   Migraines Neg Hx        "I don't think so"    Hospital Course:  Natalie Edwards is a 58 y.o. female admitted: Presented to the [facility] 05/22/2023  6:47 PM for cocaine overdose. She carries the psychiatric diagnoses of Major depressive disorder, recurrent without psychotic features, Cocaine abuse and previous suicide attempt, and has a past medical  history of HTN, GERD. Her current presentation of recent cocaine overdose with past history of MDD and suicide attempts is most consistent with major depressive disorder, recurrent vs cocaine induced mood disorder. She meets criteria for inpatient psychiatric admission based on recent cocaine overdose and risk of harm to self.  Patient is admitted to the geropsychiatry unit for further stabilization .  Patient is offered multidisciplinary team approach.  Medication management, group/milieu therapy was offered.  Throughout the hospital stay she maintain safe behaviors.  Initially she had bilateral pedal edema and hospitalist consult was made.  They adjusted her blood pressure medications by removing calcium channel blockers and it is all the pedal edema.  She was continued on her Prozac 20 mg during the stay and was using Remeron 30 mg nightly with trazodone at night.  She was past waiting in the groups and treatment plan.  On the day of discharge she consistently denied SI/HI/intent/plan.  She remains future oriented and is willing to participate in outpatient mental health services.  She was able to find a place at [facility] rehab center and her cousin came to pick her up.  Patient does not meet involuntary commitment criteria and hospitalization will be highly restrictive setting at this time.  Patient is discharged home to her cousin with the plan to follow-up with the rehab center on Tuesday.  Physical Findings: AIMS:  , ,  ,  ,    CIWA:    COWS:        Psychiatric Specialty Exam:  Presentation  General Appearance:  Appropriate for Environment; Casual  Eye Contact: Fair  Speech: Clear and Coherent  Speech Volume: Normal    Mood and Affect  Mood: Euthymic  Affect: Appropriate   Thought Process  Thought Processes: Coherent  Descriptions of Associations:Intact  Orientation:Full (Time, Place and Person)  Thought Content:Logical  Hallucinations:Hallucinations: None  Ideas of  Reference:None  Suicidal Thoughts:Suicidal Thoughts: No  Homicidal Thoughts:Homicidal Thoughts: No   Sensorium  Memory: Recent Fair; Immediate Fair; Remote Fair  Judgment: Fair  Insight: Fair   Art therapist  Concentration: Fair  Attention Span: Fair  Recall: [employer] of Knowledge: Fair  Language: Fair   Psychomotor Activity  Psychomotor Activity: Psychomotor Activity: Normal  Musculoskeletal: Strength & Muscle Tone: within normal limits Gait & Station: normal Assets  Assets: Manufacturing systems engineer; Desire for Improvement; Physical Health   Sleep  Sleep: Sleep: Fair    Physical Exam: Physical Exam Constitutional:      Appearance: Normal appearance.  HENT:     Head: Normocephalic.     Right Ear: Tympanic membrane normal.     Nose: Nose normal.     Mouth/Throat:     Mouth: Mucous membranes are moist.  Eyes:     Pupils: Pupils are equal, round, and reactive to light.  Cardiovascular:     Rate and Rhythm: Normal rate.  Pulmonary:     Breath sounds: Normal breath sounds.  Abdominal:     General: Bowel sounds are normal.  Skin:    General: Skin is warm.  Neurological:     General: No focal deficit present.     Mental Status: She is alert.    Review of Systems  Constitutional: Negative.   HENT: Negative.    Respiratory: Negative.    Cardiovascular: Negative.   Gastrointestinal: Negative.   Skin: Negative.   Neurological: Negative.    Blood pressure 111/68, pulse 92, temperature (!) 97.2 F (36.2 C), resp. rate 18, height 5\' 4"  (1.626 m), weight 82.8 kg, SpO2 97%. Body mass index is 31.33 kg/m.   Social History   Tobacco Use  Smoking Status Every Day   Current packs/day: 0.50   Average packs/day: 0.5 packs/day for 25.0 years (12.5 ttl pk-yrs)   Types: Cigarettes  Smokeless Tobacco Never  Tobacco Comments   Pt tried to quit - helps with anxiety    1 pack last patient 3 days    Tobacco Cessation:  A prescription for  an FDA-approved tobacco cessation medication provided at discharge   Blood Alcohol level:  Lab Results  Component Value Date   [facility] <10 05/22/2023   ETH <10 12/22/2022    Metabolic  Disorder Labs:  Lab Results  Component Value Date   HGBA1C 5.2 05/22/2023   MPG 102.54 05/22/2023   No results found for: "PROLACTIN" Lab Results  Component Value Date   CHOL 183 05/29/2023   TRIG 87 05/29/2023   HDL 63 05/29/2023   CHOLHDL 2.9 05/29/2023   VLDL 17 05/29/2023   LDLCALC 103 (H) 05/29/2023   LDLCALC 106 (H) 12/13/2021    See Psychiatric Specialty Exam and Suicide Risk Assessment completed by Attending Physician prior to discharge.  Discharge destination:  Home  Is patient on multiple antipsychotic therapies at discharge:  No   Has Patient had three or more failed trials of antipsychotic monotherapy by history:  No  Recommended Plan for Multiple Antipsychotic Therapies: NA   Allergies as of 06/11/2023       Reactions   Hydroxyzine Other (See Comments)   Palpitations and jitteriness    Ambien [zolpidem Tartrate] Other (See Comments)   Jitteriness, nervousness, abdominal pain   Ibuprofen         Medication List     STOP taking these medications    ciprofloxacin 500 MG tablet Commonly known as: CIPRO       TAKE these medications      Indication  acetaminophen 500 MG tablet Commonly known as: TYLENOL Take 500 mg by mouth every 6 (six) hours as needed for moderate pain (pain score 4-6).    albuterol (2.5 MG/3ML) 0.083% nebulizer solution Commonly known as: PROVENTIL Take 3 mLs (2.5 mg total) by nebulization every 2 (two) hours as needed for wheezing.    amLODipine 10 MG tablet Commonly known as: NORVASC Take 1 tablet (10 mg total) by mouth daily.    dicyclomine 20 MG tablet Commonly known as: BENTYL Take 1 tablet (20 mg total) by mouth 3 (three) times daily before meals.    FLUoxetine 20 MG capsule Commonly known as: PROZAC Take 1 capsule (20 mg  total) by mouth daily.  Indication: Major Depressive Disorder   hydrochlorothiazide 25 MG tablet Commonly known as: HYDRODIURIL Take 1 tablet (25 mg total) by mouth daily. Start taking on: June 12, 2023    mirtazapine 30 MG tablet Commonly known as: REMERON Take 1 tablet (30 mg total) by mouth at bedtime. What changed:  medication strength how much to take    naphazoline-glycerin 0.012-0.25 % Soln Commonly known as: CLEAR EYES REDNESS Place 1-2 drops into both eyes 4 (four) times daily as needed for eye irritation.    nicotine 21 mg/24hr patch Commonly known as: NICODERM CQ - dosed in mg/24 hours Place 1 patch (21 mg total) onto the skin daily. Start taking on: June 12, 2023    pantoprazole 40 MG tablet Commonly known as: PROTONIX Take 1 tablet (40 mg total) by mouth daily. What changed:  when to take this reasons to take this  Indication: Gastroesophageal Reflux Disease   traZODone 50 MG tablet Commonly known as: DESYREL Take 1 tablet (50 mg total) by mouth at bedtime.         Follow-up Information     [facility] Follow up.   Specialty: Addiction Medicine Contact information: [address] [place] [address] [phone]                 Follow-up recommendations:  Activity:  As tolerated    Signed: Verner Chol, MD 06/11/2023, 9:46 AM

## 2023-06-11 NOTE — Progress Notes (Signed)
   06/11/23 1600  Psych Admission Type (Psych Patients Only)  Admission Status Voluntary  Psychosocial Assessment  Patient Complaints None  Eye Contact Fair  Facial Expression Anxious  Affect Anxious  Speech Logical/coherent  Interaction Assertive  Motor Activity Slow  Appearance/Hygiene In scrubs  Behavior Characteristics Cooperative  Mood Anxious  Thought Process  Coherency WDL  Content WDL  Delusions None reported or observed  Perception WDL  Hallucination None reported or observed  Judgment Impaired  Confusion None  Danger to Self  Current suicidal ideation? Denies  Agreement Not to Harm Self Yes  Description of Agreement verbal  Danger to Others  Danger to Others None reported or observed

## 2023-06-11 NOTE — Progress Notes (Signed)
  [facility] Adult Case Management Discharge Plan :  Will you be returning to the same living situation after discharge:  No. Pt will stay with her cousin for a few days and then go to [facility] At discharge, do you have transportation home?: Yes,  pt's cousin will pick her up  Do you have the ability to pay for your medications: Yes,  [employer] MEDICARE / Suan Halter DUAL COMPLETE  Release of information consent forms completed and in the chart;  Patient's signature needed at discharge.  Patient to Follow up at:  Follow-up Information     [facility] Go on 06/16/2023.   Specialty: Addiction Medicine Why: Your bed with [facility] is scheduled for 06/16/23 at 10:30 AM Contact information: [address] [place] [address] [phone]                 Next level of care provider has access to [facility] Link:no  Safety Planning and Suicide Prevention discussed: [facility] [address], friend, [phone]      Has patient been referred to the Quitline?: Patient refused referral for treatment  Patient has been referred for addiction treatment: Yes, the patient will follow up with an outpatient provider for substance use disorder. Psychiatrist/APP: appointment made  Elza Rafter, LCSWA 06/11/2023, 11:20 AM

## 2023-06-11 NOTE — BHH Suicide Risk Assessment (Signed)
 [facility] Discharge Suicide Risk Assessment   Principal Problem: MDD (major depressive disorder), recurrent episode, severe (HCC) Discharge Diagnoses: Principal Problem:   MDD (major depressive disorder), recurrent episode, severe (HCC) Active Problems:   Essential hypertension   MDD (major depressive disorder), recurrent severe, without psychosis (HCC)   Ingestion of substance, intentional self-harm, sequela (HCC)   Cocaine abuse (HCC)   Pain and swelling of lower extremity   Total Time spent with patient: 30 minutes  Musculoskeletal: Strength & Muscle Tone: within normal limits Gait & Station: normal Patient leans: N/A  Psychiatric Specialty Exam  Presentation  General Appearance:  Appropriate for Environment; Casual  Eye Contact: Fair  Speech: Clear and Coherent  Speech Volume: Normal  Handedness: Right   Mood and Affect  Mood: Euthymic  Duration of Depression Symptoms: No data recorded Affect: Appropriate   Thought Process  Thought Processes: Coherent  Descriptions of Associations:Intact  Orientation:Full (Time, Place and Person)  Thought Content:Logical  History of Schizophrenia/Schizoaffective disorder:No data recorded Duration of Psychotic Symptoms:No data recorded Hallucinations:Hallucinations: None  Ideas of Reference:None  Suicidal Thoughts:Suicidal Thoughts: No  Homicidal Thoughts:Homicidal Thoughts: No   Sensorium  Memory: Immediate Fair; Recent Fair; Remote Fair  Judgment: Impaired  Insight: Shallow   Executive Functions  Concentration: Fair  Attention Span: Fair  Recall: [employer] of Knowledge: Fair  Language: Fair   Psychomotor Activity  Psychomotor Activity: Psychomotor Activity: Normal   Assets  Assets: Communication Skills; Financial Resources/Insurance; Physical Health; Resilience   Sleep  Sleep: Sleep: Fair   Physical Exam: Physical Exam ROS Blood pressure 111/68, pulse 92, temperature  (!) 97.2 F (36.2 C), resp. rate 18, height 5\' 4"  (1.626 m), weight 82.8 kg, SpO2 97%. Body mass index is 31.33 kg/m.  Mental Status Per Nursing Assessment::   On Admission:  NA  Demographic Factors:  Low socioeconomic status  Loss Factors: Decrease in vocational status  Historical Factors: Prior suicide attempts  Risk Reduction Factors:   Sense of responsibility to family, Religious beliefs about death, Living with another person, especially a relative, Positive social support, Positive therapeutic relationship, and Positive coping skills or problem solving skills  Continued Clinical Symptoms:  Previous Psychiatric Diagnoses and Treatments  Cognitive Features That Contribute To Risk:  None    Suicide Risk:  Minimal: No identifiable suicidal ideation.  Patients presenting with no risk factors but with morbid ruminations; may be classified as minimal risk based on the severity of the depressive symptoms   Follow-up Information     [facility] Follow up.   Specialty: Addiction Medicine Contact information: [address] [place] [address] [phone]                 Plan Of Care/Follow-up recommendations:  Activity:  As tolerated  Verner Chol, MD 06/11/2023, 9:45 AM

## 2023-06-11 NOTE — Plan of Care (Signed)
  Problem: Education: Goal: Utilization of techniques to improve thought processes will improve Outcome: Completed/Met Goal: Knowledge of the prescribed therapeutic regimen will improve Outcome: Completed/Met   Problem: Activity: Goal: Interest or engagement in leisure activities will improve Outcome: Completed/Met Goal: Imbalance in normal sleep/wake cycle will improve Outcome: Completed/Met   Problem: Coping: Goal: Coping ability will improve Outcome: Completed/Met Goal: Will verbalize feelings Outcome: Completed/Met   Problem: Health Behavior/Discharge Planning: Goal: Ability to make decisions will improve Outcome: Completed/Met Goal: Compliance with therapeutic regimen will improve Outcome: Completed/Met   Problem: Self-Concept: Goal: Will verbalize positive feelings about self Outcome: Completed/Met Goal: Level of anxiety will decrease Outcome: Completed/Met

## 2023-06-11 NOTE — Plan of Care (Signed)
  Problem: Education: Goal: Utilization of techniques to improve thought processes will improve Outcome: Progressing Goal: Knowledge of the prescribed therapeutic regimen will improve Outcome: Progressing   Problem: Activity: Goal: Interest or engagement in leisure activities will improve Outcome: Progressing Goal: Imbalance in normal sleep/wake cycle will improve Outcome: Progressing   

## 2023-06-11 NOTE — BHH Counselor (Signed)
 CSW contacted Harden Mo, pt's cousin.   Asencion Islam reports that she CAN come to her house until her bed is available at [facility].   Asencion Islam reports she would like pt's meds sent to [facility] in [place] and that she can pick pt up.   CSW will inform provider.    Reynaldo Minium, MSW, [place] 06/11/2023 10:56 AM

## 2023-06-22 ENCOUNTER — Telehealth: Payer: Self-pay

## 2023-06-22 NOTE — Transitions of Care (Post Inpatient/ED Visit) (Signed)
   06/22/2023  Name: Natalie Edwards MRN: [id number] DOB: Jun 01, 1965  Today's TOC FU Call Status: Today's TOC FU Call Status:: Unsuccessful Call (1st Attempt) Unsuccessful Call (1st Attempt) Date: 06/22/23  Attempted to reach the patient regarding the most recent Inpatient/ED visit.  Follow Up Plan: Additional outreach attempts will be made to reach the patient to complete the Transitions of Care (Post Inpatient/ED visit) call.   Signature Karena Addison, LPN [facility] Nurse Health Advisor Direct Dial [phone]

## 2023-06-24 NOTE — Transitions of Care (Post Inpatient/ED Visit) (Signed)
   06/24/2023  Name: Natalie Edwards MRN: [id number] DOB: [birth year]  Today's TOC FU Call Status: Today's TOC FU Call Status:: Unsuccessful Call (2nd Attempt) Unsuccessful Call (1st Attempt) Date: 06/22/23 Unsuccessful Call (2nd Attempt) Date: 06/24/23  Attempted to reach the patient regarding the most recent Inpatient/ED visit.  Follow Up Plan: Additional outreach attempts will be made to reach the patient to complete the Transitions of Care (Post Inpatient/ED visit) call.   Signature Karena Addison, LPN [facility] Nurse Health Advisor Direct Dial [phone]

## 2023-06-29 NOTE — Transitions of Care (Post Inpatient/ED Visit) (Signed)
 06/29/2023  Name: Natalie Edwards MRN: [id number] DOB: [birth year]  Today's TOC FU Call Status: Today's TOC FU Call Status:: Successful TOC FU Call Completed Unsuccessful Call (1st Attempt) Date: 06/22/23 Unsuccessful Call (2nd Attempt) Date: 06/24/23 [facility] FU Call Complete Date: 06/29/23 Patient's Name and Date of Birth confirmed.  Transition Care Management Follow-up Telephone Call Date of Discharge: 06/21/23 Discharge Facility: Other [facility]) Name of Other (Non[facility]) Discharge Facility: [place] Type of Discharge: Emergency Department Reason for ED Visit: Other: (gastritis) How have you been since you were released from the hospital?: Better Any questions or concerns?: No  Items Reviewed: Did you receive and understand the discharge instructions provided?: Yes Medications obtained,verified, and reconciled?: Yes (Medications Reviewed) Any new allergies since your discharge?: No Dietary orders reviewed?: Yes Do you have support at home?: No  Medications Reviewed Today: Medications Reviewed Today     Reviewed by Karena Addison, LPN (Licensed Practical Nurse) on 06/29/23 at 1509  Med List Status: <None>   Medication Order Taking? Sig Documenting Provider Last Dose Status Informant  acetaminophen (TYLENOL) 500 MG tablet [id number] No Take 500 mg by mouth every 6 (six) hours as needed for moderate pain (pain score 4-6). [provider] Past Week Active Self, Pharmacy Records  albuterol (PROVENTIL) (2.5 MG/3ML) 0.083% nebulizer solution [id number]  Take 3 mLs (2.5 mg total) by nebulization every 2 (two) hours as needed for wheezing. Verner Chol, MD  Active   amLODipine (NORVASC) 10 MG tablet [id number]  Take 1 tablet (10 mg total) by mouth daily. Verner Chol, MD  Active   dicyclomine (BENTYL) 20 MG tablet [id number]  Take 1 tablet (20 mg total) by mouth 3 (three) times daily before meals. Verner Chol, MD  Active   FLUoxetine (PROZAC) 20 MG capsule  [id number]  Take 1 capsule (20 mg total) by mouth daily. Verner Chol, MD  Active   hydrochlorothiazide (HYDRODIURIL) 25 MG tablet [id number]  Take 1 tablet (25 mg total) by mouth daily. Verner Chol, MD  Active   mirtazapine (REMERON) 30 MG tablet [id number]  Take 1 tablet (30 mg total) by mouth at bedtime. Verner Chol, MD  Active   naphazoline-glycerin (CLEAR EYES REDNESS) 0.012-0.25 % SOLN [id number]  Place 1-2 drops into both eyes 4 (four) times daily as needed for eye irritation. Verner Chol, MD  Active   nicotine (NICODERM CQ - DOSED IN MG/24 HOURS) 21 mg/24hr patch [id number]  Place 1 patch (21 mg total) onto the skin daily. Verner Chol, MD  Active   pantoprazole (PROTONIX) 40 MG tablet [id number]  Take 1 tablet (40 mg total) by mouth daily. Verner Chol, MD  Active   traZODone (DESYREL) 50 MG tablet [id number]  Take 1 tablet (50 mg total) by mouth at bedtime. Verner Chol, MD  Active             Home Care and Equipment/Supplies: Were Home Health Services Ordered?: NA Any new equipment or medical supplies ordered?: NA  Functional Questionnaire: Do you need assistance with bathing/showering or dressing?: No Do you need assistance with meal preparation?: No Do you need assistance with eating?: No Do you have difficulty maintaining continence: No Do you need assistance with getting out of bed/getting out of a chair/moving?: No Do you have difficulty managing or taking your medications?: No  Follow up appointments reviewed: PCP Follow-up appointment confirmed?: Yes Date of PCP follow-up appointment?: 07/08/23 Follow-up Provider: [facility] Follow-up appointment confirmed?: NA Do you need transportation to your follow-up appointment?: No Do you  understand care options if your condition(s) worsen?: Yes-patient verbalized understanding    SIGNATURE Darrall Ellison, LPN [facility] Nurse Health Advisor Direct Dial [phone]

## 2023-07-07 ENCOUNTER — Ambulatory Visit (INDEPENDENT_AMBULATORY_CARE_PROVIDER_SITE_OTHER): Admitting: Family

## 2023-07-07 ENCOUNTER — Encounter: Payer: Self-pay | Admitting: Family

## 2023-07-07 VITALS — BP 142/86 | HR 103 | Temp 98.0°F | Resp 16 | Ht 64.0 in | Wt 174.8 lb

## 2023-07-07 DIAGNOSIS — M5432 Sciatica, left side: Secondary | ICD-10-CM | POA: Diagnosis not present

## 2023-07-07 DIAGNOSIS — M25512 Pain in left shoulder: Secondary | ICD-10-CM

## 2023-07-07 DIAGNOSIS — M5431 Sciatica, right side: Secondary | ICD-10-CM

## 2023-07-07 MED ORDER — GABAPENTIN 300 MG PO CAPS: 300.0000 mg | ORAL_CAPSULE | Freq: Every day | ORAL | 1 refills | Status: DC

## 2023-07-07 MED ORDER — TRIAMCINOLONE ACETONIDE 40 MG/ML IJ SUSP
40.0000 mg | Freq: Once | INTRAMUSCULAR | Status: AC
Start: 2023-07-07 — End: 2023-07-07
  Administered 2023-07-07: 40 mg via INTRAMUSCULAR

## 2023-07-07 NOTE — Progress Notes (Signed)
 Right shoulder and both leg pain.  Pain has been going on for a couple of weeks

## 2023-07-07 NOTE — Progress Notes (Addendum)
 Patient ID: Natalie Edwards, female    DOB: 11/15/1965  MRN: [id number]  CC: Shoulder/Leg Pain  Subjective: Natalie Edwards is a 58 y.o. female who presents for shoulder/leg pain.   Her concerns today include:  - Left shoulder pain for several weeks. Denies recent trauma/injury and red flag symptoms. Taking over-the-counter Tylenol  to help.  - States bilateral legs "pinching". Denies recent trauma/injury and red flag symptoms. Taking over-the-counter Tylenol  to help.  Patient Active Problem List   Diagnosis Date Noted   Pain and swelling of lower extremity 05/28/2023   MDD (major depressive disorder), recurrent episode, severe (HCC) 05/26/2023   Ingestion of substance, intentional self-harm, sequela (HCC) 05/23/2023   SIRS (systemic inflammatory response syndrome) (HCC) 05/23/2023   Rhabdomyolysis 05/23/2023   Cocaine abuse (HCC) 05/23/2023   Intractable nausea and vomiting 01/18/2023   MDD (major depressive disorder) 12/23/2022   MDD (major depressive disorder), recurrent severe, without psychosis (HCC) 12/22/2022   Grief 12/22/2022   Acute diverticulitis 10/17/2022   Melena 09/29/2022   Gastric ulcer without hemorrhage or perforation 09/29/2022   AKI (acute kidney injury) (HCC) 09/28/2022   Metabolic acidosis 09/28/2022   Epigastric pain 09/28/2022   Nausea 09/28/2022   Hyperlipidemia 12/14/2021   Prediabetes 12/14/2021   Anxiety and depression 05/14/2021   Breathing difficult 05/14/2021   GERD (gastroesophageal reflux disease)    Possible exposure to STD 01/14/2021   Chronic insomnia 10/18/2019   CKD (chronic kidney disease) 04/07/2019   GAD (generalized anxiety disorder) 05/07/2018   Chronic gastric ulcer 05/07/2018   Essential hypertension 05/07/2018   Iron  deficiency anemia due to chronic blood loss 09/23/2016   Peptic ulcer disease with hemorrhage 09/23/2016   Thrombocytosis 09/09/2016   Leukocytosis/thrombocytosis 09/09/2016   Gastric nodule 09/09/2016   H/O ulcer  disease 07/31/2016     Current Outpatient Medications on File Prior to Visit  Medication Sig Dispense Refill   acetaminophen  (TYLENOL ) 500 MG tablet Take 500 mg by mouth every 6 (six) hours as needed for moderate pain (pain score 4-6).     albuterol  (PROVENTIL ) (2.5 MG/3ML) 0.083% nebulizer solution Take 3 mLs (2.5 mg total) by nebulization every 2 (two) hours as needed for wheezing. 75 mL 12   amLODipine  (NORVASC ) 10 MG tablet Take 1 tablet (10 mg total) by mouth daily. 30 tablet 0   dicyclomine  (BENTYL ) 20 MG tablet Take 1 tablet (20 mg total) by mouth 3 (three) times daily before meals. 90 tablet 0   FLUoxetine  (PROZAC ) 20 MG capsule Take 1 capsule (20 mg total) by mouth daily. 30 capsule 3   hydrochlorothiazide  (HYDRODIURIL ) 25 MG tablet Take 1 tablet (25 mg total) by mouth daily. 30 tablet 0   mirtazapine  (REMERON ) 30 MG tablet Take 1 tablet (30 mg total) by mouth at bedtime. 30 tablet 0   naphazoline-glycerin  (CLEAR EYES REDNESS) 0.012-0.25 % SOLN Place 1-2 drops into both eyes 4 (four) times daily as needed for eye irritation. 1 mL 0   nicotine  (NICODERM CQ  - DOSED IN MG/24 HOURS) 21 mg/24hr patch Place 1 patch (21 mg total) onto the skin daily. 28 patch 0   pantoprazole  (PROTONIX ) 40 MG tablet Take 1 tablet (40 mg total) by mouth daily. 30 tablet 3   traZODone  (DESYREL ) 50 MG tablet Take 1 tablet (50 mg total) by mouth at bedtime. 30 tablet 0   No current facility-administered medications on file prior to visit.    Allergies  Allergen Reactions   Hydroxyzine  Other (See Comments)    Palpitations  and jitteriness    Ambien  [Zolpidem  Tartrate] Other (See Comments)    Jitteriness, nervousness, abdominal pain   Ibuprofen      Social History   Socioeconomic History   Marital status: Single    Spouse name: Not on file   Number of children: 3   Years of education: Not on file   Highest education level: 12th grade  Occupational History   Occupation: environmental services at  [employer]  Tobacco Use   Smoking status: Every Day    Current packs/day: 0.50    Average packs/day: 0.5 packs/day for 25.0 years (12.5 ttl pk-yrs)    Types: Cigarettes   Smokeless tobacco: Never   Tobacco comments:    Pt tried to quit - helps with anxiety     1 pack last patient 3 days   Vaping Use   Vaping status: Never Used  Substance and Sexual Activity   Alcohol use: Yes    Comment: occasionally - last drink was July 4, 1 shot   Drug use: No   Sexual activity: Yes    Comment: hysterectomy  Other Topics Concern   Not on file  Social History Narrative   Lives at home in an apartment. Her mother lives with her.    Right handed   Caffeine: drinks approx. 36 oz of pepsi per day. Sometimes drinks 2 cups of coffee in a day as well.    Social Drivers of Corporate investment banker Strain: Low Risk  (03/06/2023)   Overall Financial Resource Strain (CARDIA)    Difficulty of Paying Living Expenses: Not very hard  Food Insecurity: Patient Declined (05/26/2023)   Hunger Vital Sign    Worried About Running Out of Food in the Last Year: Patient declined    Ran Out of Food in the Last Year: Patient declined  Recent Concern: Food Insecurity - Food Insecurity Present (05/24/2023)   Hunger Vital Sign    Worried About Running Out of Food in the Last Year: Sometimes true    Ran Out of Food in the Last Year: Often true  Transportation Needs: No Transportation Needs (05/26/2023)   PRAPARE - Administrator, Civil Service (Medical): No    Lack of Transportation (Non-Medical): No  Recent Concern: Transportation Needs - Unmet Transportation Needs (05/24/2023)   PRAPARE - Administrator, Civil Service (Medical): Yes    Lack of Transportation (Non-Medical): No  Physical Activity: Sufficiently Active (05/22/2022)   Exercise Vital Sign    Days of Exercise per Week: 4 days    Minutes of Exercise per Session: 70 min  Stress: Stress Concern Present (03/06/2023)   [employer]  of Occupational Health - Occupational Stress Questionnaire    Feeling of Stress : Very much  Social Connections: Socially Isolated (05/26/2023)   Social Connection and Isolation Panel [NHANES]    Frequency of Communication with Friends and Family: More than three times a week    Frequency of Social Gatherings with Friends and Family: More than three times a week    Attends Religious Services: Never    Database administrator or Organizations: No    Attends Banker Meetings: Never    Marital Status: Divorced  Catering manager Violence: Not At Risk (06/21/2023)   Received from [facility]   HITS    Over the last 12 months how often did your partner physically hurt you?: Never    Over the last 12 months how often did your partner insult  you or talk down to you?: Never    Over the last 12 months how often did your partner threaten you with physical harm?: Never    Over the last 12 months how often did your partner scream or curse at you?: Never    Family History  Problem Relation Age of Onset   Cancer Maternal Uncle        Lung   Cancer Maternal Uncle        Lung   Cancer Maternal Uncle        Lung   Headache Neg Hx        "I don't think so"   Migraines Neg Hx        "I don't think so"    Past Surgical History:  Procedure Laterality Date   ABDOMINAL HYSTERECTOMY     BIOPSY  09/29/2022   Procedure: BIOPSY;  Surgeon: Sergio Dandy, MD;  Location: [facility] ENDOSCOPY;  Service: Gastroenterology;;   CHOLECYSTECTOMY     COLONOSCOPY WITH ESOPHAGOGASTRODUODENOSCOPY (EGD)  04/07/2023   [facility]   ESOPHAGOGASTRODUODENOSCOPY (EGD) WITH PROPOFOL  N/A 09/29/2022   Procedure: ESOPHAGOGASTRODUODENOSCOPY (EGD) WITH PROPOFOL ;  Surgeon: Sergio Dandy, MD;  Location: [facility] ENDOSCOPY;  Service: Gastroenterology;  Laterality: N/A;    ROS: Review of Systems Negative except as stated above  PHYSICAL EXAM: BP (!) 142/86   Pulse (!) 103   Temp 98 F (36.7 C) (Oral)   Resp  16   Ht 5\' 4"  (1.626 m)   Wt 174 lb 12.8 oz (79.3 kg)   SpO2 97%   BMI 30.00 kg/m   Physical Exam HENT:     Head: Normocephalic and atraumatic.     Nose: Nose normal.     Mouth/Throat:     Mouth: Mucous membranes are moist.     Pharynx: Oropharynx is clear.  Eyes:     Extraocular Movements: Extraocular movements intact.     Conjunctiva/sclera: Conjunctivae normal.     Pupils: Pupils are equal, round, and reactive to light.  Cardiovascular:     Rate and Rhythm: Tachycardia present.     Pulses: Normal pulses.     Heart sounds: Normal heart sounds.  Pulmonary:     Effort: Pulmonary effort is normal.     Breath sounds: Normal breath sounds.  Musculoskeletal:     Right shoulder: Normal.     Left shoulder: Decreased range of motion.     Right upper arm: Normal.     Left upper arm: Normal.     Right elbow: Normal.     Left elbow: Normal.     Right forearm: Normal.     Left forearm: Normal.     Right wrist: Normal.     Left wrist: Normal.     Right hand: Normal.     Left hand: Normal.     Cervical back: Normal, normal range of motion and neck supple.     Thoracic back: Normal.     Lumbar back: Normal.     Right hip: Normal.     Left hip: Normal.     Right upper leg: Normal.     Left upper leg: Normal.     Right knee: Normal.     Left knee: Normal.     Right lower leg: Normal.     Left lower leg: Normal.     Right ankle: Normal.     Left ankle: Normal.     Right foot: Normal.     Left  foot: Normal.  Neurological:     General: No focal deficit present.     Mental Status: She is alert and oriented to person, place, and time.  Psychiatric:        Mood and Affect: Mood normal.        Behavior: Behavior normal.    ASSESSMENT AND PLAN: 1. Acute pain of left shoulder (Primary) - Gabapentin  as prescribed. Counseled on medication adherence/adverse effects.  - Triamcinolone  Acetonide injection administered.  - Follow-up with primary provider in 4 weeks or sooner if  needed.  - gabapentin  (NEURONTIN ) 300 MG capsule; Take 1 capsule (300 mg total) by mouth at bedtime.  Dispense: 30 capsule; Refill: 1 - triamcinolone  acetonide (KENALOG -40) injection 40 mg  2. Bilateral sciatica - Gabapentin  as prescribed. Counseled on medication adherence/adverse effects.  - Triamcinolone  Acetonide injection administered.  - Follow-up with primary provider in 4 weeks or sooner if needed.  - gabapentin  (NEURONTIN ) 300 MG capsule; Take 1 capsule (300 mg total) by mouth at bedtime.  Dispense: 30 capsule; Refill: 1 - triamcinolone  acetonide (KENALOG -40) injection 40 mg   Patient was given the opportunity to ask questions.  Patient verbalized understanding of the plan and was able to repeat key elements of the plan. Patient was given clear instructions to go to Emergency Department or return to medical center if symptoms don't improve, worsen, or new problems develop.The patient verbalized understanding.    Requested Prescriptions   Signed Prescriptions Disp Refills   gabapentin  (NEURONTIN ) 300 MG capsule 30 capsule 1    Sig: Take 1 capsule (300 mg total) by mouth at bedtime.    Return for Follow-Up or next available with Abraham Abo, MD.  Senaida Dama, NP

## 2023-07-08 ENCOUNTER — Encounter: Payer: Self-pay | Admitting: Nurse Practitioner

## 2023-07-08 ENCOUNTER — Ambulatory Visit (INDEPENDENT_AMBULATORY_CARE_PROVIDER_SITE_OTHER): Admitting: Nurse Practitioner

## 2023-07-08 VITALS — BP 132/82 | HR 97 | Ht 64.0 in | Wt 176.4 lb

## 2023-07-08 DIAGNOSIS — F32A Depression, unspecified: Secondary | ICD-10-CM | POA: Diagnosis not present

## 2023-07-08 DIAGNOSIS — F419 Anxiety disorder, unspecified: Secondary | ICD-10-CM | POA: Diagnosis not present

## 2023-07-08 DIAGNOSIS — F17210 Nicotine dependence, cigarettes, uncomplicated: Secondary | ICD-10-CM | POA: Diagnosis not present

## 2023-07-08 DIAGNOSIS — F5104 Psychophysiologic insomnia: Secondary | ICD-10-CM | POA: Diagnosis not present

## 2023-07-08 NOTE — Assessment & Plan Note (Signed)
 Significant psychiatric history. Difficulties managing her insomnia. Given recent hospitalization with overdose of sedating medications and cocaine use, recommend psychiatry continue to manage her insomnia and further adjustments to pharmacological therapy. Mood was stable today without SI/HI. Discussed with patient and educated that this was the safest option for her in light of recent events and ongoing mood disorders. Advised her to reach out to her psychiatrist to determine alternative options as she does not like trazodone  and Remeron . Also suggested she could trial alternating night to night and only take one medication at a time to see if this is tolerable but educated to not discontinue either abruptly. Verbalized understanding. Sleep hygiene reviewed.   Patient Instructions  Since you feel like the trazodone  and Remeron  aren't working well for you, reach out to your psychiatrist to see what other options they recommend and if they're willing to adjust your medications You can try taking just the trazodone  one night then the remeron  the next night and see if you notice any difference or if one makes you feel bad   Psychiatry will help you manage your insomnia medications and sleep issues Make sure you're keeping your room cool and dark and avoid electronics at bedtime   Follow up with us  as needed If symptoms do not improve or worsen, please contact office for sooner follow up or seek emergency care.

## 2023-07-08 NOTE — Patient Instructions (Signed)
 Since you feel like the trazodone  and Remeron  aren't working well for you, reach out to your psychiatrist to see what other options they recommend and if they're willing to adjust your medications You can try taking just the trazodone  one night then the remeron  the next night and see if you notice any difference or if one makes you feel bad   Psychiatry will help you manage your insomnia medications and sleep issues Make sure you're keeping your room cool and dark and avoid electronics at bedtime   Follow up with us  as needed If symptoms do not improve or worsen, please contact office for sooner follow up or seek emergency care.

## 2023-07-08 NOTE — Assessment & Plan Note (Signed)
 See above

## 2023-07-08 NOTE — Progress Notes (Signed)
 @Patient  ID: Natalie Edwards, female    DOB: 1965/08/18, 58 y.o.   MRN: [id number]  Chief Complaint  Patient presents with   Follow-up    Pt had moved out the area  she has since moved back she is out of her sleep medication     Referring provider: Abraham Abo, MD  HPI: 58 year old female, active smoker followed for chronic insomnia. She is a patient of Dr. Alyne Jules and last seen in office 07/22/2022. Past medical history significant for HTN, GERD, PUD, hx of rhabdomyolysis, CKD, anxiety and depression, hx of substance abuse, HLD, hx of SI, prediabetes.   TEST/EVENTS:  04/11/2019 HST: [facility] 4.4/h  07/22/2022: OV with Dr. Gaynell Keeler. Chronic insomnia; difficult to treat. Restoril  has been helping - takes 30 mg nightly. Uses Belsoma in addition. Getting about 6 hours of sleep. Feels she gets good enough sleep. Not waking up groggy. Able to go back to sleep if she wakes up. Trying not to use both medications at the same time. Working third shift; no change in schedule which has helped. Continue current regimen.   07/08/2023: Today - follow up Discussed the use of AI scribe software for clinical note transcription with the patient, who gave verbal consent to proceed.  History of Present Illness   Natalie Edwards is a 58 year old female who presents for medication refills and follow-up for insomnia.  She was recently hospitalized for intentional drug overdose. She was found down at home by family after taking extra doses of sedative medications. She was also positive for cocaine. Family was unsure of exactly what medications she took but reported this had not been the first time she had overdosed on her medications. She was admitted from 05/26/2023-06/02/2023 and then discharged to inpatient behavioral health until 06/11/2023. During her stay at [facility] and per notes reviewed, she adamantly denied attempted SI or suicidal thoughts. They adjusted her medication regimen. She was started on trazodone  and continued on  Remeron  for sleep.  Psychiatry recommended continuing Remeron  and trazodone  for her insomnia outpatient.   She tells me today she dislikes the way these make her feel when she takes them at night. She has not discussed this with her psychiatrist. They just make her feel out of it. Not causing any mood changes. She feels the Belsomra  and tempazepam worked better for her.   She missed a couple of months of psychiatric appointments prior to her hospitalization but is now seeing her psychiatrist monthly. She denies mood swings, feeling depressed or anxious since her recent hospitalization and feels she is returning to her normal routine. No SI/HI. Does have some daytime fatigue. Wakes up throughout the night. She does not drive. No sleep parasomnias.       Allergies  Allergen Reactions   Hydroxyzine  Other (See Comments)    Palpitations and jitteriness    Ambien  [Zolpidem  Tartrate] Other (See Comments)    Jitteriness, nervousness, abdominal pain   Ibuprofen      Immunization History  Administered Date(s) Administered   Influenza, Seasonal, Injecte, Preservative Fre 12/26/2022   PFIZER(Purple Top)SARS-COV-2 Vaccination 06/20/2019, 07/12/2019   Tdap 03/17/2014    Past Medical History:  Diagnosis Date   Allergy    Anemia    Diverticulosis    Gall bladder stones 1993   Gastric ulcer    GERD (gastroesophageal reflux disease)    Hypertension    Renal mass     Tobacco History: Social History   Tobacco Use  Smoking Status Every Day   Current  packs/day: 0.50   Average packs/day: 0.5 packs/day for 25.0 years (12.5 ttl pk-yrs)   Types: Cigarettes  Smokeless Tobacco Never  Tobacco Comments   Pt tried to quit - helps with anxiety    1 pack last patient 3 days    Ready to quit: Not Answered Counseling given: Not Answered Tobacco comments: Pt tried to quit - helps with anxiety  1 pack last patient 3 days    Outpatient Medications Prior to Visit  Medication Sig Dispense Refill    acetaminophen  (TYLENOL ) 500 MG tablet Take 500 mg by mouth every 6 (six) hours as needed for moderate pain (pain score 4-6).     albuterol  (PROVENTIL ) (2.5 MG/3ML) 0.083% nebulizer solution Take 3 mLs (2.5 mg total) by nebulization every 2 (two) hours as needed for wheezing. 75 mL 12   amLODipine  (NORVASC ) 10 MG tablet Take 1 tablet (10 mg total) by mouth daily. 30 tablet 0   dicyclomine  (BENTYL ) 20 MG tablet Take 1 tablet (20 mg total) by mouth 3 (three) times daily before meals. 90 tablet 0   FLUoxetine  (PROZAC ) 20 MG capsule Take 1 capsule (20 mg total) by mouth daily. 30 capsule 3   gabapentin  (NEURONTIN ) 300 MG capsule Take 1 capsule (300 mg total) by mouth at bedtime. 30 capsule 1   hydrochlorothiazide  (HYDRODIURIL ) 25 MG tablet Take 1 tablet (25 mg total) by mouth daily. 30 tablet 0   mirtazapine  (REMERON ) 30 MG tablet Take 1 tablet (30 mg total) by mouth at bedtime. 30 tablet 0   naphazoline-glycerin  (CLEAR EYES REDNESS) 0.012-0.25 % SOLN Place 1-2 drops into both eyes 4 (four) times daily as needed for eye irritation. 1 mL 0   nicotine  (NICODERM CQ  - DOSED IN MG/24 HOURS) 21 mg/24hr patch Place 1 patch (21 mg total) onto the skin daily. 28 patch 0   pantoprazole  (PROTONIX ) 40 MG tablet Take 1 tablet (40 mg total) by mouth daily. 30 tablet 3   traZODone  (DESYREL ) 50 MG tablet Take 1 tablet (50 mg total) by mouth at bedtime. 30 tablet 0   No facility-administered medications prior to visit.     Review of Systems:   Constitutional: No weight loss or gain, night sweats, fevers, chills, or lassitude. +fatigue  HEENT: No headaches, difficulty swallowing, tooth/dental problems, or sore throat. No sneezing, itching, ear ache, nasal congestion, or post nasal drip CV:  No chest pain, orthopnea, PND, swelling in lower extremities  Resp: No shortness of breath with exertion or at rest. No cough. GI:  No heartburn, indigestion GU: No nocturia  Neuro: No dizziness or lightheadedness.  Psych:  +stable depression, anxiety. No SI/HI. Mood stable. +sleep disturbance     Physical Exam:  BP 132/82   Pulse 97   Ht 5\' 4"  (1.626 m)   Wt 176 lb 6.4 oz (80 kg)   SpO2 98%   BMI 30.28 kg/m   GEN: Pleasant, interactive, well-appearing; in no acute distress HEENT:  Normocephalic and atraumatic. PERRLA. Sclera white. Nasal turbinates pink, moist and patent bilaterally. No rhinorrhea present. Oropharynx pink and moist, without exudate or edema. No lesions, ulcerations, or postnasal drip.  NECK:  Supple w/ fair ROM. No lymphadenopathy.   CV: RRR, no m/r/g, no peripheral edema. Pulses intact, +2 bilaterally. No cyanosis, pallor or clubbing. PULMONARY:  Unlabored, regular breathing. Clear bilaterally A&P w/o wheezes/rales/rhonchi. No accessory muscle use.  GI: BS present and normoactive. Soft, non-tender to palpation.  MSK: No erythema, warmth or tenderness. Cap refil <2 sec all extrem.  Neuro: A/Ox3. No focal deficits noted.   Skin: Warm, no lesions or rashe Psych: Normal affect and behavior. Judgement and thought content appropriate.     Lab Results:  CBC    Component Value Date/Time   WBC 11.5 (H) 05/28/2023 1051   RBC 3.77 (L) 05/28/2023 1051   HGB 10.2 (L) 05/28/2023 1051   HGB 12.0 10/10/2022 1050   HGB 11.0 (L) 10/08/2022 1132   HGB 11.4 (L) 09/09/2016 1329   HCT 31.1 (L) 05/28/2023 1051   HCT 34.9 10/08/2022 1132   HCT 35.6 09/09/2016 1329   PLT 558 (H) 05/28/2023 1051   PLT 451 (H) 10/10/2022 1050   PLT 501 (H) 10/08/2022 1132   MCV 82.5 05/28/2023 1051   MCV 86 10/08/2022 1132   MCV 80.5 09/09/2016 1329   MCH 27.1 05/28/2023 1051   MCHC 32.8 05/28/2023 1051   RDW 16.8 (H) 05/28/2023 1051   RDW 15.9 (H) 10/08/2022 1132   RDW 16.8 (H) 09/09/2016 1329   LYMPHSABS 4.0 05/28/2023 1051   LYMPHSABS 5.3 (H) 10/08/2022 1132   LYMPHSABS 5.9 (H) 09/09/2016 1329   MONOABS 1.4 (H) 05/28/2023 1051   MONOABS 1.0 (H) 09/09/2016 1329   EOSABS 0.2 05/28/2023 1051   EOSABS  0.2 10/08/2022 1132   BASOSABS 0.0 05/28/2023 1051   BASOSABS 0.1 10/08/2022 1132   BASOSABS 0.0 09/09/2016 1329    BMET    Component Value Date/Time   NA 137 05/30/2023 0624   NA 144 10/08/2022 1132   K 3.8 05/30/2023 0624   CL 106 05/30/2023 0624   CO2 26 05/30/2023 0624   GLUCOSE 100 (H) 05/30/2023 0624   BUN 21 (H) 05/30/2023 0624   BUN 17 10/08/2022 1132   CREATININE 1.07 (H) 05/30/2023 0624   CREATININE 1.24 (H) 10/10/2022 1050   CALCIUM  8.6 (L) 05/30/2023 0624   GFRNONAA >60 05/30/2023 0624   GFRNONAA 51 (L) 10/10/2022 1050   GFRAA 51 (L) 03/08/2019 0738    BNP    Component Value Date/Time   BNP 15.8 05/29/2023 1243     Imaging:  No results found.  triamcinolone  acetonide (KENALOG -40) injection 40 mg     Date Action Dose Route User   07/07/2023 0909 Given 40 mg Intramuscular (Right Deltoid) Bernardine Bridegroom, CMA           No data to display          No results found for: "NITRICOXIDE"      Assessment & Plan:   Chronic insomnia Significant psychiatric history. Difficulties managing her insomnia. Given recent hospitalization with overdose of sedating medications and cocaine use, recommend psychiatry continue to manage her insomnia and further adjustments to pharmacological therapy. Mood was stable today without SI/HI. Discussed with patient and educated that this was the safest option for her in light of recent events and ongoing mood disorders. Advised her to reach out to her psychiatrist to determine alternative options as she does not like trazodone  and Remeron . Also suggested she could trial alternating night to night and only take one medication at a time to see if this is tolerable but educated to not discontinue either abruptly. Verbalized understanding. Sleep hygiene reviewed.   Patient Instructions  Since you feel like the trazodone  and Remeron  aren't working well for you, reach out to your psychiatrist to see what other options they  recommend and if they're willing to adjust your medications You can try taking just the trazodone  one night then the remeron  the next night and  see if you notice any difference or if one makes you feel bad   Psychiatry will help you manage your insomnia medications and sleep issues Make sure you're keeping your room cool and dark and avoid electronics at bedtime   Follow up with us  as needed If symptoms do not improve or worsen, please contact office for sooner follow up or seek emergency care.    Anxiety and depression See above   Advised if symptoms do not improve or worsen, to please contact office for sooner follow up or seek emergency care.   I spent 35 minutes of dedicated to the care of this patient on the date of this encounter to include pre-visit review of records, face-to-face time with the patient discussing conditions above, post visit ordering of testing, clinical documentation with the electronic health record, making appropriate referrals as documented, and communicating necessary findings to members of the patients care team.  Roetta Clarke, NP 07/08/2023  Pt aware and understands NP's role.

## 2023-07-21 ENCOUNTER — Emergency Department (HOSPITAL_COMMUNITY)
Admission: EM | Admit: 2023-07-21 | Discharge: 2023-07-21 | Disposition: A | Discharge status: Home / Self Care | Attending: Emergency Medicine | Admitting: Emergency Medicine

## 2023-07-21 ENCOUNTER — Other Ambulatory Visit: Payer: Self-pay

## 2023-07-21 ENCOUNTER — Encounter (HOSPITAL_COMMUNITY): Payer: Self-pay | Admitting: Emergency Medicine

## 2023-07-21 DIAGNOSIS — F419 Anxiety disorder, unspecified: Secondary | ICD-10-CM

## 2023-07-21 DIAGNOSIS — F410 Panic disorder [episodic paroxysmal anxiety] without agoraphobia: Secondary | ICD-10-CM | POA: Diagnosis present

## 2023-07-21 MED ORDER — TRAZODONE HCL 50 MG PO TABS: 50.0000 mg | ORAL_TABLET | Freq: Every day | ORAL | 0 refills | Status: DC

## 2023-07-21 MED ORDER — TRAZODONE HCL 100 MG PO TABS
50.0000 mg | ORAL_TABLET | Freq: Every day | ORAL | Status: DC
  Administered 2023-07-21: 50 mg via ORAL
  Filled 2023-07-21: qty 1

## 2023-07-21 MED ORDER — TEMAZEPAM 15 MG PO CAPS: 15.0000 mg | ORAL_CAPSULE | Freq: Two times a day (BID) | ORAL | 0 refills | Status: DC | PRN

## 2023-07-21 MED ORDER — FLUOXETINE HCL 20 MG PO TABS: 20.0000 mg | ORAL_TABLET | Freq: Every day | ORAL | 0 refills | Status: DC

## 2023-07-21 MED ORDER — MIRTAZAPINE 30 MG PO TABS: 30.0000 mg | ORAL_TABLET | Freq: Every day | ORAL | 0 refills | Status: DC

## 2023-07-21 MED ORDER — LORAZEPAM 1 MG PO TABS
1.0000 mg | ORAL_TABLET | Freq: Once | ORAL | Status: AC
  Administered 2023-07-21: 1 mg via ORAL
  Filled 2023-07-21: qty 1

## 2023-07-21 MED ORDER — HYDROCHLOROTHIAZIDE 25 MG PO TABS: 25.0000 mg | ORAL_TABLET | Freq: Every day | ORAL | 0 refills | Status: DC

## 2023-07-21 NOTE — ED Provider Notes (Signed)
 [place] EMERGENCY DEPARTMENT AT [facility] Provider Note   CSN: [id number] Arrival date & time: 07/21/23  1808     History  Chief Complaint  Patient presents with   Anxiety    Natalie Edwards is a 58 y.o. female presenting to the ED with complaint of anxiety attack.  Patient ports that she ran out of her anxiety medications at home and she has an appointment with her psychiatrist in 1 week but she was having "full-blown panic attack".  She is tearful, says her mind is racing and she cannot stay still.  HPI     Home Medications Prior to Admission medications   Medication Sig Start Date End Date Taking? Authorizing Provider  FLUoxetine  (PROZAC ) 20 MG tablet Take 1 tablet (20 mg total) by mouth daily for 7 days. 07/22/23 07/29/23 Yes Jacoby Ritsema, Janalyn Me, MD  hydrochlorothiazide  (HYDRODIURIL ) 25 MG tablet Take 1 tablet (25 mg total) by mouth daily for 7 days. 07/21/23 07/28/23 Yes Artavis Cowie, Janalyn Me, MD  mirtazapine  (REMERON ) 30 MG tablet Take 1 tablet (30 mg total) by mouth at bedtime for 7 days. 07/22/23 07/29/23 Yes Davarious Tumbleson, Janalyn Me, MD  temazepam  (RESTORIL ) 15 MG capsule Take 1 capsule (15 mg total) by mouth every 12 (twelve) hours as needed for up to 14 doses for sleep. 07/21/23  Yes Arvilla Birmingham, MD  traZODone  (DESYREL ) 50 MG tablet Take 1 tablet (50 mg total) by mouth at bedtime for 7 days. 07/22/23 07/29/23 Yes Paizley Ramella, Janalyn Me, MD  acetaminophen  (TYLENOL ) 500 MG tablet Take 500 mg by mouth every 6 (six) hours as needed for moderate pain (pain score 4-6).    [provider]  albuterol  (PROVENTIL ) (2.5 MG/3ML) 0.083% nebulizer solution Take 3 mLs (2.5 mg total) by nebulization every 2 (two) hours as needed for wheezing. 06/11/23   Jadapalle, Sree, MD  amLODipine  (NORVASC ) 10 MG tablet Take 1 tablet (10 mg total) by mouth daily. 06/11/23   Jadapalle, Sree, MD  dicyclomine  (BENTYL ) 20 MG tablet Take 1 tablet (20 mg total) by mouth 3 (three) times daily before meals. 06/11/23    Jadapalle, Sree, MD  FLUoxetine  (PROZAC ) 20 MG capsule Take 1 capsule (20 mg total) by mouth daily. 06/11/23   Jadapalle, Sree, MD  gabapentin  (NEURONTIN ) 300 MG capsule Take 1 capsule (300 mg total) by mouth at bedtime. 07/07/23   Senaida Dama, NP  hydrochlorothiazide  (HYDRODIURIL ) 25 MG tablet Take 1 tablet (25 mg total) by mouth daily. 06/12/23   Jadapalle, Sree, MD  mirtazapine  (REMERON ) 30 MG tablet Take 1 tablet (30 mg total) by mouth at bedtime. 06/11/23   Aurelia Blotter, MD  naphazoline-glycerin  (CLEAR EYES REDNESS) 0.012-0.25 % SOLN Place 1-2 drops into both eyes 4 (four) times daily as needed for eye irritation. 06/11/23   Jadapalle, Sree, MD  nicotine  (NICODERM CQ  - DOSED IN MG/24 HOURS) 21 mg/24hr patch Place 1 patch (21 mg total) onto the skin daily. 06/12/23   Jadapalle, Sree, MD  pantoprazole  (PROTONIX ) 40 MG tablet Take 1 tablet (40 mg total) by mouth daily. 06/11/23   Jadapalle, Sree, MD  traZODone  (DESYREL ) 50 MG tablet Take 1 tablet (50 mg total) by mouth at bedtime. 06/11/23   Jadapalle, Sree, MD      Allergies    Hydroxyzine , Ambien  [zolpidem  tartrate], and Ibuprofen     Review of Systems   Review of Systems  Physical Exam Updated Vital Signs BP 136/84 (BP Location: Left Arm)   Pulse 90   Temp 98 F (  36.7 C) (Oral)   Resp 17   Ht 5\' 4"  (1.626 m)   Wt 80 kg   SpO2 98%   BMI 30.27 kg/m  Physical Exam Constitutional:      General: She is not in acute distress. HENT:     Head: Normocephalic and atraumatic.  Eyes:     Conjunctiva/sclera: Conjunctivae normal.     Pupils: Pupils are equal, round, and reactive to light.  Cardiovascular:     Rate and Rhythm: Regular rhythm. Tachycardia present.  Pulmonary:     Effort: Pulmonary effort is normal. No respiratory distress.  Abdominal:     General: There is no distension.     Tenderness: There is no abdominal tenderness.  Skin:    General: Skin is warm and dry.  Neurological:     General: No focal deficit present.      Mental Status: She is alert. Mental status is at baseline.  Psychiatric:        Mood and Affect: Mood normal.        Behavior: Behavior normal.        Thought Content: Thought content normal.        Judgment: Judgment normal.     ED Results / Procedures / Treatments   Labs (all labs ordered are listed, but only abnormal results are displayed) Labs Reviewed - No data to display  EKG None  Radiology No results found.  Procedures Procedures    Medications Ordered in ED Medications  traZODone  (DESYREL ) tablet 50 mg (50 mg Oral Given 07/21/23 1922)  LORazepam  (ATIVAN ) tablet 1 mg (1 mg Oral Given 07/21/23 1921)    ED Course/ Medical Decision Making/ A&P                                 Medical Decision Making Risk Prescription drug management.   Patient is presenting with complaint of significant anxiety.  I reviewed her medical records and she appears to be on temazepam  15 mg, being weaned down on this medication but this is a chronic benzo.  This is for review of PDMP.  She is also on mirtazapine  and trazodone  and Prozac .  Meds last prescribed 06/11/23. I can provide her 1 week refill of these medications until she sees her psychiatrist.  Do not believe that she is requiring further workup at this time.  She does not appear acutely psychotic or responding to stimuli and is not requiring IVC.        Final Clinical Impression(s) / ED Diagnoses Final diagnoses:  Anxiety    Rx / DC Orders ED Discharge Orders          Ordered    mirtazapine  (REMERON ) 30 MG tablet  Daily at bedtime        07/21/23 1917    traZODone  (DESYREL ) 50 MG tablet  Daily at bedtime        07/21/23 1917    FLUoxetine  (PROZAC ) 20 MG tablet  Daily        07/21/23 1917    hydrochlorothiazide  (HYDRODIURIL ) 25 MG tablet  Daily        07/21/23 1917    temazepam  (RESTORIL ) 15 MG capsule  Every 12 hours PRN        07/21/23 1918              Arvilla Birmingham, MD 07/22/23 0000

## 2023-07-21 NOTE — ED Triage Notes (Signed)
 Patient presents from home due to anxiety. She has not taken her medication in a week. Should get a refill in a week, but does not believe she can make it to that time. She has been tearful with EMS.   EMS vitals: 144/96 BP 116 P 28 RR 99% SPO2 on room air

## 2023-07-21 NOTE — Discharge Instructions (Addendum)
 You were given a 1 week supply of your anxiety and psychiatric medications as a brief refill until you see your psychiatrist.  Please be aware that the emergency department will not continue refilling chronic medications, particularly benzos or narcotic medications.  It is the responsibility to make sure that you have refill medications ready and are taking your medications at home as prescribed.  Thank you.

## 2023-07-28 ENCOUNTER — Other Ambulatory Visit: Payer: Self-pay

## 2023-07-28 ENCOUNTER — Encounter: Payer: Self-pay | Admitting: Internal Medicine

## 2023-07-28 ENCOUNTER — Emergency Department (HOSPITAL_COMMUNITY)

## 2023-07-28 ENCOUNTER — Ambulatory Visit
Admission: RE | Admit: 2023-07-28 | Discharge: 2023-07-28 | Disposition: A | Discharge status: Other Acute Inpatient Hospital | Source: Ambulatory Visit | Attending: Family Medicine | Admitting: Family Medicine

## 2023-07-28 ENCOUNTER — Encounter (HOSPITAL_COMMUNITY): Payer: Self-pay

## 2023-07-28 ENCOUNTER — Emergency Department (HOSPITAL_COMMUNITY)
Admission: EM | Admit: 2023-07-28 | Discharge: 2023-07-28 | Disposition: A | Discharge status: Home / Self Care | Attending: Emergency Medicine | Admitting: Emergency Medicine

## 2023-07-28 VITALS — BP 112/76 | HR 93 | Temp 98.4°F | Resp 18 | Wt 176.4 lb

## 2023-07-28 DIAGNOSIS — R109 Unspecified abdominal pain: Secondary | ICD-10-CM

## 2023-07-28 DIAGNOSIS — K5792 Diverticulitis of intestine, part unspecified, without perforation or abscess without bleeding: Secondary | ICD-10-CM | POA: Diagnosis not present

## 2023-07-28 DIAGNOSIS — D72829 Elevated white blood cell count, unspecified: Secondary | ICD-10-CM | POA: Insufficient documentation

## 2023-07-28 DIAGNOSIS — R112 Nausea with vomiting, unspecified: Secondary | ICD-10-CM | POA: Diagnosis not present

## 2023-07-28 DIAGNOSIS — R198 Other specified symptoms and signs involving the digestive system and abdomen: Secondary | ICD-10-CM | POA: Diagnosis not present

## 2023-07-28 DIAGNOSIS — R1032 Left lower quadrant pain: Secondary | ICD-10-CM | POA: Diagnosis present

## 2023-07-28 LAB — CBC
HCT: 38.8 % (ref 36.0–46.0)
Hemoglobin: 12.4 g/dL (ref 12.0–15.0)
MCH: 26.7 pg (ref 26.0–34.0)
MCHC: 32 g/dL (ref 30.0–36.0)
MCV: 83.6 fL (ref 80.0–100.0)
Platelets: 497 10*3/uL — ABNORMAL HIGH (ref 150–400)
RBC: 4.64 MIL/uL (ref 3.87–5.11)
RDW: 18.7 % — ABNORMAL HIGH (ref 11.5–15.5)
WBC: 14.7 10*3/uL — ABNORMAL HIGH (ref 4.0–10.5)
nRBC: 0 % (ref 0.0–0.2)

## 2023-07-28 LAB — COMPREHENSIVE METABOLIC PANEL WITH GFR
ALT: 24 U/L (ref 0–44)
AST: 15 U/L (ref 15–41)
Albumin: 4.2 g/dL (ref 3.5–5.0)
Alkaline Phosphatase: 77 U/L (ref 38–126)
Anion gap: 10 (ref 5–15)
BUN: 15 mg/dL (ref 6–20)
CO2: 20 mmol/L — ABNORMAL LOW (ref 22–32)
Calcium: 9.7 mg/dL (ref 8.9–10.3)
Chloride: 107 mmol/L (ref 98–111)
Creatinine, Ser: 1.1 mg/dL — ABNORMAL HIGH (ref 0.44–1.00)
GFR, Estimated: 58 mL/min — ABNORMAL LOW (ref >60)
Glucose, Bld: 93 mg/dL (ref 70–99)
Potassium: 3.6 mmol/L (ref 3.5–5.1)
Sodium: 137 mmol/L (ref 135–145)
Total Bilirubin: 0.6 mg/dL (ref 0.0–1.2)
Total Protein: 8.3 g/dL — ABNORMAL HIGH (ref 6.5–8.1)

## 2023-07-28 LAB — LIPASE, BLOOD: Lipase: 27 U/L (ref 11–51)

## 2023-07-28 MED ORDER — ONDANSETRON HCL 4 MG/2ML IJ SOLN
4.0000 mg | Freq: Once | INTRAMUSCULAR | Status: AC
Start: 2023-07-28 — End: 2023-07-28
  Administered 2023-07-28: 4 mg via INTRAVENOUS
  Filled 2023-07-28: qty 2

## 2023-07-28 MED ORDER — AMOXICILLIN-POT CLAVULANATE 875-125 MG PO TABS: 1.0000 | ORAL_TABLET | Freq: Two times a day (BID) | ORAL | 0 refills | Status: DC

## 2023-07-28 MED ORDER — PIPERACILLIN-TAZOBACTAM 3.375 G IVPB 30 MIN
3.3750 g | Freq: Once | INTRAVENOUS | Status: AC
  Administered 2023-07-28: 3.375 g via INTRAVENOUS
  Filled 2023-07-28: qty 50

## 2023-07-28 MED ORDER — MORPHINE SULFATE (PF) 4 MG/ML IV SOLN
4.0000 mg | Freq: Once | INTRAVENOUS | Status: AC
  Administered 2023-07-28: 4 mg via INTRAVENOUS
  Filled 2023-07-28: qty 1

## 2023-07-28 MED ORDER — IOHEXOL 300 MG/ML  SOLN
100.0000 mL | Freq: Once | INTRAMUSCULAR | Status: AC | PRN
  Administered 2023-07-28: 100 mL via INTRAVENOUS

## 2023-07-28 MED ORDER — LACTATED RINGERS IV BOLUS
1000.0000 mL | Freq: Once | INTRAVENOUS | Status: AC
  Administered 2023-07-28: 1000 mL via INTRAVENOUS

## 2023-07-28 MED ORDER — OXYCODONE-ACETAMINOPHEN 5-325 MG PO TABS: 1.0000 | ORAL_TABLET | Freq: Four times a day (QID) | ORAL | 0 refills | Status: DC | PRN

## 2023-07-28 MED ORDER — PROMETHAZINE HCL 12.5 MG PO TABS
12.5000 mg | ORAL_TABLET | Freq: Four times a day (QID) | ORAL | 0 refills | Status: DC | PRN
Start: 2023-07-28 — End: 2023-10-08

## 2023-07-28 MED ORDER — HYDROMORPHONE HCL 1 MG/ML IJ SOLN
0.5000 mg | Freq: Once | INTRAMUSCULAR | Status: AC
  Administered 2023-07-28: 0.5 mg via INTRAVENOUS
  Filled 2023-07-28: qty 1

## 2023-07-28 MED ORDER — METOCLOPRAMIDE HCL 5 MG/ML IJ SOLN
10.0000 mg | Freq: Once | INTRAMUSCULAR | Status: AC
  Administered 2023-07-28: 10 mg via INTRAVENOUS
  Filled 2023-07-28: qty 2

## 2023-07-28 NOTE — ED Provider Notes (Signed)
 [place] EMERGENCY DEPARTMENT AT [facility] Provider Note   CSN: [id number] Arrival date & time: 07/28/23  1812     History  Chief Complaint  Patient presents with   Abdominal Pain    Natalie Edwards is a 58 y.o. female.  Pt c/o LLQ pain in past 1-2 days. Constant, dull, non radiating. Hx diverticula, ?similar. Nausea. No vomiting. Had regular bm today. No fever/chills. No dysuria, hematuria or gu c/o. No vaginal discharge or bleeding. No back/flank pain. No cough or uri symptoms. No chest pain or sob.    The history is provided by the patient and medical records.  Abdominal Pain Associated symptoms: nausea   Associated symptoms: no chest pain, no chills, no constipation, no cough, no diarrhea, no dysuria, no fever, no shortness of breath, no sore throat, no vaginal bleeding, no vaginal discharge and no vomiting        Home Medications Prior to Admission medications   Medication Sig Start Date End Date Taking? Authorizing Provider  amoxicillin -clavulanate (AUGMENTIN ) 875-125 MG tablet Take 1 tablet by mouth every 12 (twelve) hours. 07/28/23  Yes Guadalupe Lee, MD  oxyCODONE -acetaminophen  (PERCOCET/ROXICET) 5-325 MG tablet Take 1-2 tablets by mouth every 6 (six) hours as needed for severe pain (pain score 7-10). 07/28/23  Yes Jeannifer Drakeford, MD  promethazine  (PHENERGAN ) 12.5 MG tablet Take 1 tablet (12.5 mg total) by mouth every 6 (six) hours as needed for nausea or vomiting. 07/28/23  Yes Guadalupe Lee, MD  albuterol  (PROVENTIL ) (2.5 MG/3ML) 0.083% nebulizer solution Take 3 mLs (2.5 mg total) by nebulization every 2 (two) hours as needed for wheezing. 06/11/23   Jadapalle, Sree, MD  amLODipine  (NORVASC ) 10 MG tablet Take 1 tablet (10 mg total) by mouth daily. 06/11/23   Jadapalle, Sree, MD  dicyclomine  (BENTYL ) 20 MG tablet Take 1 tablet (20 mg total) by mouth 3 (three) times daily before meals. 06/11/23   Jadapalle, Sree, MD  FLUoxetine  (PROZAC ) 20 MG capsule Take 1 capsule  (20 mg total) by mouth daily. 06/11/23   Jadapalle, Sree, MD  FLUoxetine  (PROZAC ) 20 MG tablet Take 1 tablet (20 mg total) by mouth daily for 7 days. 07/22/23 07/29/23  Arvilla Birmingham, MD  gabapentin  (NEURONTIN ) 300 MG capsule Take 1 capsule (300 mg total) by mouth at bedtime. 07/07/23   Senaida Dama, NP  hydrochlorothiazide  (HYDRODIURIL ) 25 MG tablet Take 1 tablet (25 mg total) by mouth daily. 06/12/23   Jadapalle, Sree, MD  hydrochlorothiazide  (HYDRODIURIL ) 25 MG tablet Take 1 tablet (25 mg total) by mouth daily for 7 days. 07/21/23 07/28/23  Arvilla Birmingham, MD  mirtazapine  (REMERON ) 30 MG tablet Take 1 tablet (30 mg total) by mouth at bedtime. 06/11/23   Jadapalle, Sree, MD  mirtazapine  (REMERON ) 30 MG tablet Take 1 tablet (30 mg total) by mouth at bedtime for 7 days. 07/22/23 07/29/23  Arvilla Birmingham, MD  naphazoline-glycerin  (CLEAR EYES REDNESS) 0.012-0.25 % SOLN Place 1-2 drops into both eyes 4 (four) times daily as needed for eye irritation. 06/11/23   Jadapalle, Sree, MD  nicotine  (NICODERM CQ  - DOSED IN MG/24 HOURS) 21 mg/24hr patch Place 1 patch (21 mg total) onto the skin daily. 06/12/23   Jadapalle, Sree, MD  pantoprazole  (PROTONIX ) 40 MG tablet Take 1 tablet (40 mg total) by mouth daily. 06/11/23   Jadapalle, Sree, MD  temazepam  (RESTORIL ) 15 MG capsule Take 1 capsule (15 mg total) by mouth every 12 (twelve) hours as needed for up to 14 doses for sleep.  07/21/23   Arvilla Birmingham, MD  traZODone  (DESYREL ) 50 MG tablet Take 1 tablet (50 mg total) by mouth at bedtime. 06/11/23   Jadapalle, Sree, MD  traZODone  (DESYREL ) 50 MG tablet Take 1 tablet (50 mg total) by mouth at bedtime for 7 days. 07/22/23 07/29/23  Arvilla Birmingham, MD      Allergies    Hydroxyzine , Ambien  [zolpidem  tartrate], and Ibuprofen     Review of Systems   Review of Systems  Constitutional:  Negative for chills and fever.  HENT:  Negative for sore throat.   Respiratory:  Negative for cough and shortness of breath.    Cardiovascular:  Negative for chest pain.  Gastrointestinal:  Positive for abdominal pain and nausea. Negative for blood in stool, constipation, diarrhea and vomiting.  Genitourinary:  Negative for dysuria, flank pain, vaginal bleeding and vaginal discharge.  Musculoskeletal:  Negative for back pain.  Skin:  Negative for rash.  Neurological:  Negative for headaches.    Physical Exam Updated Vital Signs BP 133/66 (BP Location: Right Arm)   Pulse 73   Temp 98.2 F (36.8 C) (Oral)   Resp 16   Ht 1.626 m (5\' 4" )   Wt 80.7 kg   SpO2 95%   BMI 30.55 kg/m  Physical Exam Vitals and nursing note reviewed.  Constitutional:      Appearance: Normal appearance. She is well-developed.  HENT:     Head: Atraumatic.     Nose: Nose normal.     Mouth/Throat:     Mouth: Mucous membranes are moist.     Pharynx: Oropharynx is clear. No oropharyngeal exudate or posterior oropharyngeal erythema.  Eyes:     General: No scleral icterus.    Conjunctiva/sclera: Conjunctivae normal.  Neck:     Trachea: No tracheal deviation.  Cardiovascular:     Rate and Rhythm: Normal rate and regular rhythm.     Pulses: Normal pulses.     Heart sounds: Normal heart sounds. No murmur heard.    No friction rub. No gallop.  Pulmonary:     Effort: Pulmonary effort is normal. No respiratory distress.     Breath sounds: Normal breath sounds.  Abdominal:     General: Bowel sounds are normal. There is no distension.     Palpations: Abdomen is soft. There is no mass.     Tenderness: There is abdominal tenderness. There is no guarding or rebound.     Hernia: No hernia is present.     Comments: Left abd tenderness.   Genitourinary:    Comments: No cva tenderness.  Musculoskeletal:        General: No swelling or tenderness.     Cervical back: Normal range of motion and neck supple. No rigidity. No muscular tenderness.  Skin:    General: Skin is warm and dry.     Findings: No rash.  Neurological:     Mental  Status: She is alert.     Comments: Alert, speech normal.   Psychiatric:        Mood and Affect: Mood normal.     ED Results / Procedures / Treatments   Labs (all labs ordered are listed, but only abnormal results are displayed) Results for orders placed or performed during the hospital encounter of 07/28/23  Lipase, blood   Collection Time: 07/28/23  6:43 PM  Result Value Ref Range   Lipase 27 11 - 51 U/L  Comprehensive metabolic panel   Collection Time: 07/28/23  6:43 PM  Result  Value Ref Range   Sodium 137 135 - 145 mmol/L   Potassium 3.6 3.5 - 5.1 mmol/L   Chloride 107 98 - 111 mmol/L   CO2 20 (L) 22 - 32 mmol/L   Glucose, Bld 93 70 - 99 mg/dL   BUN 15 6 - 20 mg/dL   Creatinine, Ser 1.61 (H) 0.44 - 1.00 mg/dL   Calcium  9.7 8.9 - 10.3 mg/dL   Total Protein 8.3 (H) 6.5 - 8.1 g/dL   Albumin 4.2 3.5 - 5.0 g/dL   AST 15 15 - 41 U/L   ALT 24 0 - 44 U/L   Alkaline Phosphatase 77 38 - 126 U/L   Total Bilirubin 0.6 0.0 - 1.2 mg/dL   GFR, Estimated 58 (L) >60 mL/min   Anion gap 10 5 - 15  CBC   Collection Time: 07/28/23  6:43 PM  Result Value Ref Range   WBC 14.7 (H) 4.0 - 10.5 K/uL   RBC 4.64 3.87 - 5.11 MIL/uL   Hemoglobin 12.4 12.0 - 15.0 g/dL   HCT 09.6 04.5 - 40.9 %   MCV 83.6 80.0 - 100.0 fL   MCH 26.7 26.0 - 34.0 pg   MCHC 32.0 30.0 - 36.0 g/dL   RDW 81.1 (H) 91.4 - 78.2 %   Platelets 497 (H) 150 - 400 K/uL   nRBC 0.0 0.0 - 0.2 %     EKG None  Radiology CT ABDOMEN PELVIS W CONTRAST Result Date: 07/28/2023 CLINICAL DATA:  Nausea and left-sided abdominal pain. EXAM: CT ABDOMEN AND PELVIS WITH CONTRAST TECHNIQUE: Multidetector CT imaging of the abdomen and pelvis was performed using the standard protocol following bolus administration of intravenous contrast. RADIATION DOSE REDUCTION: This exam was performed according to the departmental dose-optimization program which includes automated exposure control, adjustment of the mA and/or kV according to patient size  and/or use of iterative reconstruction technique. CONTRAST:  OMNIPAQUE  IOHEXOL  300 MG/ML  SOLN COMPARISON:  March 04, 2023 FINDINGS: Lower chest: No acute abnormality. Hepatobiliary: No focal liver abnormality is seen. Status post cholecystectomy. No biliary dilatation. Pancreas: Unremarkable. No pancreatic ductal dilatation or surrounding inflammatory changes. Spleen: Normal in size without focal abnormality. Adrenals/Urinary Tract: A stable 2.0 cm left adrenal mass is noted (approximately 111.45 Hounsfield units). Kidneys are normal in size, without renal calculi or hydronephrosis. Multiple stable bilateral simple renal cysts are noted. Bladder is unremarkable. Stomach/Bowel: Stomach is within normal limits. Appendix appears normal. No evidence of bowel wall thickening, distention, or inflammatory changes. Numerous diverticula are seen throughout the descending and sigmoid colon. Mild thickening of the mid sigmoid colon is also noted. Vascular/Lymphatic: Aortic atherosclerosis. No enlarged abdominal or pelvic lymph nodes. Reproductive: Status post hysterectomy. No adnexal masses. Other: No abdominal wall hernia or abnormality. No abdominopelvic ascites. Musculoskeletal: No acute or significant osseous findings. IMPRESSION: 1. Colonic diverticulosis with mild thickening of the mid sigmoid colon which may represent sequelae associated with mild diverticulitis. 2. Stable left adrenal adenoma. 3. Multiple stable bilateral simple renal cysts. No follow-up imaging is recommended. This recommendation follows ACR consensus guidelines: Management of the Incidental Renal Mass on CT: A White Paper of the ACR Incidental Findings Committee. J Am Coll Radiol [phone]. 4. Evidence of prior cholecystectomy and hysterectomy. 5. Aortic atherosclerosis. Electronically Signed   By: Virgle Grime M.D.   On: 07/28/2023 21:05    Procedures Procedures    Medications Ordered in ED Medications   piperacillin -tazobactam (ZOSYN ) IVPB 3.375 g (3.375 g Intravenous New Bag/Given 07/28/23 2203)  ondansetron  (ZOFRAN ) injection 4 mg (4 mg Intravenous Given 07/28/23 2034)  morphine  (PF) 4 MG/ML injection 4 mg (4 mg Intravenous Given 07/28/23 2037)  iohexol  (OMNIPAQUE ) 300 MG/ML solution 100 mL (100 mLs Intravenous Contrast Given 07/28/23 2046)  lactated ringers  bolus 1,000 mL (1,000 mLs Intravenous New Bag/Given 07/28/23 2201)  HYDROmorphone  (DILAUDID ) injection 0.5 mg (0.5 mg Intravenous Given 07/28/23 2200)  metoCLOPramide  (REGLAN ) injection 10 mg (10 mg Intravenous Given 07/28/23 2201)    ED Course/ Medical Decision Making/ A&P                                 Medical Decision Making Problems Addressed: Acute diverticulitis: acute illness or injury with systemic symptoms that poses a threat to life or bodily functions  Amount and/or Complexity of Data Reviewed External Data Reviewed: notes. Labs: ordered. Decision-making details documented in ED Course. Radiology: ordered and independent interpretation performed. Decision-making details documented in ED Course.  Risk Prescription drug management. Parenteral controlled substances. Decision regarding hospitalization.   Iv ns. Continuous pulse ox and cardiac monitoring. Labs ordered/sent. Imaging ordered.   Differential diagnosis includes diverticulitis, pyelonephritis, abd cramping, etc. Dispo decision including potential need for admission considered - will get labs and imaging and reassess.   Reviewed nursing notes and prior charts for additional history. External reports reviewed.   LR bolus. Dilaudid  iv. Reglan  iv (pt indicates zofran  does not help her).   Cardiac monitor: sinus rhythm, rate 88.  Labs reviewed/interpreted by me - wbc elev, 14. Hgb 12. Chem unremarkable, lipase normal.   CT reviewed/interpreted by me - mild/early diverticulitis.   Zosyn  iv.   Recheck tolerating po, comfortable, nad. Pt appears stable for ED  d/c.   Rec close pcp f/u.  Return precautions provided.          Final Clinical Impression(s) / ED Diagnoses Final diagnoses:  Acute diverticulitis    Rx / DC Orders ED Discharge Orders          Ordered    amoxicillin -clavulanate (AUGMENTIN ) 875-125 MG tablet  Every 12 hours        07/28/23 2223    oxyCODONE -acetaminophen  (PERCOCET/ROXICET) 5-325 MG tablet  Every 6 hours PRN        07/28/23 2223    promethazine  (PHENERGAN ) 12.5 MG tablet  Every 6 hours PRN        07/28/23 2223              Zion Lint, MD 07/28/23 2225

## 2023-07-28 NOTE — ED Provider Triage Note (Signed)
 Emergency Medicine Provider Triage Evaluation Note  Natalie Edwards , a 58 y.o. female  was evaluated in triage.  Pt complains of abdominal pain.  Review of Systems  Positive:  Negative:   Physical Exam  BP (!) 122/104 (BP Location: Right Arm)   Pulse 94   Temp 97.7 F (36.5 C) (Oral)   Resp 16   Ht 5\' 4"  (1.626 m)   Wt 80.7 kg   SpO2 100%   BMI 30.55 kg/m  Gen:   Awake, no distress   Resp:  Normal effort  MSK:   Moves extremities without difficulty  Other:    Medical Decision Making  Medically screening exam initiated at 6:29 PM.  Appropriate orders placed.  Natalie Edwards was informed that the remainder of the evaluation will be completed by another provider, this initial triage assessment does not replace that evaluation, and the importance of remaining in the ED until their evaluation is complete.  Left sided abdominal pain x3 days. Hx diverticulitis. Also with nausea, vomiting, and diarrhea.    [place], [place] 07/28/23 [phone]

## 2023-07-28 NOTE — ED Notes (Signed)
 Per MD placed meds on hold, Was given hour after administration of zofran  and morphine  that was not given in triage.

## 2023-07-28 NOTE — ED Triage Notes (Addendum)
 Pt c/o abd pain in LLQ and nausea since Sunday. Pt states the sxs feel similar to diverticulitis. Pt denies emesis but does c/o diarrhea, today only.

## 2023-07-28 NOTE — ED Triage Notes (Signed)
 BIB EMS from [facility] for nausea and left sided abdominal pain that started Sunday. Hx of diverticulitis and feels the same. 2 episodes of diarrhea.

## 2023-07-28 NOTE — Discharge Instructions (Signed)
 Given your 10 out of 10 abdominal pain I recommend you go to the emergency room for further evaluation and management.

## 2023-07-28 NOTE — Discharge Instructions (Addendum)
 It was our pleasure to provide your ER care today - we hope that you feel better.  Drink plenty of fluids/stay well hydrated.   Take augmentin  (antibiotic) as prescribed. Take motrin  or aleve as need for pain. You may also take percocet as need for pain. No driving for the next 6 hours or when taking percocet. Also, do not take tylenol  or acetaminophen  containing medication when taking percocet.  You may take phenergan  as need for nausea - no driving when taking.   Return to ER if worse, new symptoms, severe or worsening or intractable pain, persistent vomiting, high fevers, or other concern.    You were given pain meds in the ER - no driving for the next 6 hours.

## 2023-07-28 NOTE — ED Provider Notes (Signed)
 [facility]    CSN: [id number] Arrival date & time: 07/28/23  1615      History   Chief Complaint Chief Complaint  Patient presents with   Abdominal Pain    Entered by patient   Nausea    HPI Natalie Edwards is a 58 y.o. female.   Patient presents today with a 3-day history of left-sided abdominal pain.  She reports that pain is rated 10 on a 0-10 pain scale, described as intense aching with periodic sharp pains, no aggravating relieving factors identified.  She reports associated nausea with emesis.  She has also had diarrhea but denies any melena or hematochezia.  She has felt chills but has not measured a temperature.  She has a history of diverticulitis and required hospitalization with IV antibiotics and bowel rest in the past.  Last episode was several months ago.  She denies any recent medication changes or dietary changes.  She is unable to manage the pain prompting evaluation.    Past Medical History:  Diagnosis Date   Allergy    Anemia    Diverticulosis    Gall bladder stones 1993   Gastric ulcer    GERD (gastroesophageal reflux disease)    Hypertension    Renal mass     Patient Active Problem List   Diagnosis Date Noted   Pain and swelling of lower extremity 05/28/2023   MDD (major depressive disorder), recurrent episode, severe (HCC) 05/26/2023   Ingestion of substance, intentional self-harm, sequela (HCC) 05/23/2023   SIRS (systemic inflammatory response syndrome) (HCC) 05/23/2023   Rhabdomyolysis 05/23/2023   Cocaine abuse (HCC) 05/23/2023   Intractable nausea and vomiting 01/18/2023   MDD (major depressive disorder) 12/23/2022   MDD (major depressive disorder), recurrent severe, without psychosis (HCC) 12/22/2022   Grief 12/22/2022   Acute diverticulitis 10/17/2022   Melena 09/29/2022   Gastric ulcer without hemorrhage or perforation 09/29/2022   AKI (acute kidney injury) (HCC) 09/28/2022   Metabolic acidosis 09/28/2022   Epigastric pain  09/28/2022   Nausea 09/28/2022   Hyperlipidemia 12/14/2021   Prediabetes 12/14/2021   Anxiety and depression 05/14/2021   Breathing difficult 05/14/2021   GERD (gastroesophageal reflux disease)    Possible exposure to STD 01/14/2021   Chronic insomnia 10/18/2019   CKD (chronic kidney disease) 04/07/2019   GAD (generalized anxiety disorder) 05/07/2018   Chronic gastric ulcer 05/07/2018   Essential hypertension 05/07/2018   Iron  deficiency anemia due to chronic blood loss 09/23/2016   Peptic ulcer disease with hemorrhage 09/23/2016   Thrombocytosis 09/09/2016   Leukocytosis/thrombocytosis 09/09/2016   Gastric nodule 09/09/2016   H/O ulcer disease 07/31/2016    Past Surgical History:  Procedure Laterality Date   ABDOMINAL HYSTERECTOMY     BIOPSY  09/29/2022   Procedure: BIOPSY;  Surgeon: Sergio Dandy, MD;  Location: [facility];  Service: Gastroenterology;;   CHOLECYSTECTOMY     COLONOSCOPY WITH ESOPHAGOGASTRODUODENOSCOPY (EGD)  04/07/2023   [facility]   ESOPHAGOGASTRODUODENOSCOPY (EGD) WITH PROPOFOL  N/A 09/29/2022   Procedure: ESOPHAGOGASTRODUODENOSCOPY (EGD) WITH PROPOFOL ;  Surgeon: Sergio Dandy, MD;  Location: [facility] END[facility]COPY;  Service: Gastroenterology;  Laterality: N/A;    OB History     Gravida  7   Para  3   Term  3   Preterm      AB  4   Living         SAB      IAB  4   Ectopic      Multiple  Live Births               Home Medications    Prior to Admission medications   Medication Sig Start Date End Date Taking? Authorizing Provider  acetaminophen  (TYLENOL ) 500 MG tablet Take 500 mg by mouth every 6 (six) hours as needed for moderate pain (pain score 4-6).    [provider]  albuterol  (PROVENTIL ) (2.5 MG/3ML) 0.083% nebulizer solution Take 3 mLs (2.5 mg total) by nebulization every 2 (two) hours as needed for wheezing. 06/11/23   Jadapalle, Sree, MD  amLODipine  (NORVASC ) 10 MG tablet Take 1 tablet (10 mg total)  by mouth daily. 06/11/23   Jadapalle, Sree, MD  dicyclomine  (BENTYL ) 20 MG tablet Take 1 tablet (20 mg total) by mouth 3 (three) times daily before meals. 06/11/23   Jadapalle, Sree, MD  FLUoxetine  (PROZAC ) 20 MG capsule Take 1 capsule (20 mg total) by mouth daily. 06/11/23   Jadapalle, Sree, MD  FLUoxetine  (PROZAC ) 20 MG tablet Take 1 tablet (20 mg total) by mouth daily for 7 days. 07/22/23 07/29/23  Arvilla Birmingham, MD  gabapentin  (NEURONTIN ) 300 MG capsule Take 1 capsule (300 mg total) by mouth at bedtime. 07/07/23   Senaida Dama, NP  hydrochlorothiazide  (HYDRODIURIL ) 25 MG tablet Take 1 tablet (25 mg total) by mouth daily. 06/12/23   Aurelia Blotter, MD  hydrochlorothiazide  (HYDRODIURIL ) 25 MG tablet Take 1 tablet (25 mg total) by mouth daily for 7 days. 07/21/23 07/28/23  Arvilla Birmingham, MD  mirtazapine  (REMERON ) 30 MG tablet Take 1 tablet (30 mg total) by mouth at bedtime. 06/11/23   Jadapalle, Sree, MD  mirtazapine  (REMERON ) 30 MG tablet Take 1 tablet (30 mg total) by mouth at bedtime for 7 days. 07/22/23 07/29/23  Arvilla Birmingham, MD  naphazoline-glycerin  (CLEAR EYES REDNESS) 0.012-0.25 % SOLN Place 1-2 drops into both eyes 4 (four) times daily as needed for eye irritation. 06/11/23   Jadapalle, Sree, MD  nicotine  (NICODERM CQ  - DOSED IN MG/24 HOURS) 21 mg/24hr patch Place 1 patch (21 mg total) onto the skin daily. 06/12/23   Jadapalle, Sree, MD  pantoprazole  (PROTONIX ) 40 MG tablet Take 1 tablet (40 mg total) by mouth daily. 06/11/23   Jadapalle, Sree, MD  temazepam  (RESTORIL ) 15 MG capsule Take 1 capsule (15 mg total) by mouth every 12 (twelve) hours as needed for up to 14 doses for sleep. 07/21/23   Arvilla Birmingham, MD  traZODone  (DESYREL ) 50 MG tablet Take 1 tablet (50 mg total) by mouth at bedtime. 06/11/23   Jadapalle, Sree, MD  traZODone  (DESYREL ) 50 MG tablet Take 1 tablet (50 mg total) by mouth at bedtime for 7 days. 07/22/23 07/29/23  Arvilla Birmingham, MD    Family History Family History   Problem Relation Age of Onset   Cancer Maternal Uncle        Lung   Cancer Maternal Uncle        Lung   Cancer Maternal Uncle        Lung   Headache Neg Hx        "I don't think so"   Migraines Neg Hx        "I don't think so"    Social History Social History   Tobacco Use   Smoking status: Every Day    Current packs/day: 0.50    Average packs/day: 0.5 packs/day for 25.0 years (12.5 ttl pk-yrs)    Types: Cigarettes   Smokeless tobacco: Never   Tobacco  comments:    Pt tried to quit - helps with anxiety     1 pack last patient 3 days   Vaping Use   Vaping status: Never Used  Substance Use Topics   Alcohol use: Yes    Comment: occasionally - last drink was July 4, 1 shot   Drug use: No     Allergies   Hydroxyzine , Ambien  [zolpidem  tartrate], and Ibuprofen    Review of Systems Review of Systems  Constitutional:  Positive for activity change. Negative for appetite change, fatigue and fever.  Gastrointestinal:  Positive for abdominal pain, diarrhea, nausea and vomiting. Negative for blood in stool and constipation.  Genitourinary:  Negative for dysuria, frequency and urgency.  Neurological:  Negative for dizziness, light-headedness and headaches.     Physical Exam Triage Vital Signs ED Triage Vitals  Encounter Vitals Group     BP 07/28/23 1644 112/76     Systolic BP Percentile --      Diastolic BP Percentile --      Pulse Rate 07/28/23 1644 93     Resp 07/28/23 1644 18     Temp 07/28/23 1644 98.4 F (36.9 C)     Temp Source 07/28/23 1644 Oral     SpO2 07/28/23 1644 97 %     Weight 07/28/23 1643 176 lb 5.9 oz (80 kg)     Height --      Head Circumference --      Peak Flow --      Pain Score 07/28/23 1642 10     Pain Loc --      Pain Education --      Exclude from Growth Chart --    No data found.  Updated Vital Signs BP 112/76 (BP Location: Left Arm)   Pulse 93   Temp 98.4 F (36.9 C) (Oral)   Resp 18   Wt 176 lb 5.9 oz (80 kg)   SpO2 97%   BMI  30.27 kg/m   Visual Acuity Right Eye Distance:   Left Eye Distance:   Bilateral Distance:    Right Eye Near:   Left Eye Near:    Bilateral Near:     Physical Exam Vitals reviewed.  Constitutional:      General: She is awake. She is not in acute distress.    Appearance: Normal appearance. She is well-developed. She is ill-appearing.     Comments: Very pleasant female clutching abdomen rocking back and forth in pain but in no acute distress  HENT:     Head: Normocephalic and atraumatic.     Mouth/Throat:     Mouth: Mucous membranes are moist.     Pharynx: Uvula midline. No oropharyngeal exudate or posterior oropharyngeal erythema.  Cardiovascular:     Rate and Rhythm: Normal rate and regular rhythm.     Heart sounds: Normal heart sounds, S1 normal and S2 normal. No murmur heard. Pulmonary:     Effort: Pulmonary effort is normal.     Breath sounds: Normal breath sounds. No wheezing, rhonchi or rales.     Comments: Clear to auscultation bilaterally Abdominal:     General: Bowel sounds are normal.     Palpations: Abdomen is soft.     Tenderness: There is abdominal tenderness in the left upper quadrant and left lower quadrant. There is guarding. There is no right CVA tenderness, left CVA tenderness or rebound.     Comments: Significant tenderness palpation throughout left abdomen with guarding and rebound tenderness in left lower  quadrant.  Psychiatric:        Behavior: Behavior is cooperative.      UC Treatments / Results  Labs (all labs ordered are listed, but only abnormal results are displayed) Labs Reviewed - No data to display  EKG   Radiology No results found.  Procedures Procedures (including critical care time)  Medications Ordered in UC Medications - No data to display  Initial Impression / Assessment and Plan / UC Course  I have reviewed the triage vital signs and the nursing notes.  Pertinent labs & imaging results that were available during my care  of the patient were reviewed by me and considered in my medical decision making (see chart for details).     Patient is ill-appearing with significant abdominal pain and guarding.  Discussed that given severity of her pain I recommend she go to the emergency room.  Unfortunately, she did not have the ability to get herself to the ER and I offered her a bus pass but she did not believe that she could get onto a bus in order to go to the ER unassisted and so EMS was called.  She then was concerned about having to wait in the ER and requested we prescribe her medication.  Discussed that my recommendation would be to go to the emergency room given the severity of pain as we do not have the ability to get stat labs or imaging in urgent care but that if she refused to go and send an AMA form I would consider sending an antibiotic.  After discussion of risks and benefits she did agree to go to the ER and was sent by [facility].  She was stable at time of discharge.  Final Clinical Impressions(s) / UC Diagnoses   Final diagnoses:  Left sided abdominal pain  Abdominal guarding  Nausea and vomiting, unspecified vomiting type     Discharge Instructions      Given your 10 out of 10 abdominal pain I recommend you go to the emergency room for further evaluation and management.  ED Prescriptions   None    PDMP not reviewed this encounter.   Budd Cargo, PA-C 07/28/23 1713

## 2023-07-28 NOTE — ED Notes (Signed)
 Patient is being discharged from the Urgent Care and sent to the Emergency Department via EMS . Per ER, patient is in need of higher level of care due to abd pain. Patient is aware and verbalizes understanding of plan of care.  Vitals:   07/28/23 1644  BP: 112/76  Pulse: 93  Resp: 18  Temp: 98.4 F (36.9 C)  SpO2: 97%

## 2023-07-29 ENCOUNTER — Telehealth: Payer: Self-pay

## 2023-07-29 NOTE — Transitions of Care (Post Inpatient/ED Visit) (Signed)
 07/29/2023  Name: Natalie Edwards MRN: [id number] DOB: Apr 30, 1965  Today's TOC FU Call Status: Today's TOC FU Call Status:: Successful TOC FU Call Completed TOC FU Call Complete Date: 07/29/23 Patient's Name and Date of Birth confirmed.  Transition Care Management Follow-up Telephone Call Date of Discharge: 07/28/23 Discharge Facility: [facility]) Reason for ED Visit: Other: (diverticulitis) How have you been since you were released from the hospital?: Better Any questions or concerns?: No  Items Reviewed: Did you receive and understand the discharge instructions provided?: Yes Medications obtained,verified, and reconciled?: Yes (Medications Reviewed) Any new allergies since your discharge?: No Dietary orders reviewed?: Yes Do you have support at home?: No  Medications Reviewed Today: Medications Reviewed Today     Reviewed by Darrall Ellison, LPN (Licensed Practical Nurse) on 07/29/23 at 1502  Med List Status: <None>   Medication Order Taking? Sig Documenting Provider Last Dose Status Informant  albuterol  (PROVENTIL ) (2.5 MG/3ML) 0.083% nebulizer solution [id number] No Take 3 mLs (2.5 mg total) by nebulization every 2 (two) hours as needed for wheezing. Jadapalle, Sree, MD Taking Active   amLODipine  (NORVASC ) 10 MG tablet [id number] No Take 1 tablet (10 mg total) by mouth daily. Jadapalle, Sree, MD Taking Active   amoxicillin -clavulanate (AUGMENTIN ) 875-125 MG tablet [id number]  Take 1 tablet by mouth every 12 (twelve) hours. Steinl, Kevin, MD  Active   dicyclomine  (BENTYL ) 20 MG tablet [id number] No Take 1 tablet (20 mg total) by mouth 3 (three) times daily before meals. Jadapalle, Sree, MD Taking Active   FLUoxetine  (PROZAC ) 20 MG capsule [id number] No Take 1 capsule (20 mg total) by mouth daily. Jadapalle, Sree, MD Taking Active   FLUoxetine  (PROZAC ) 20 MG tablet [id number]  Take 1 tablet (20 mg total) by mouth daily for 7 days. Arvilla Birmingham, MD  Active   gabapentin   (NEURONTIN ) 300 MG capsule [id number] No Take 1 capsule (300 mg total) by mouth at bedtime. Senaida Dama, NP Taking Active   hydrochlorothiazide  (HYDRODIURIL ) 25 MG tablet [id number] No Take 1 tablet (25 mg total) by mouth daily. Jadapalle, Sree, MD Taking Active   hydrochlorothiazide  (HYDRODIURIL ) 25 MG tablet [id number]  Take 1 tablet (25 mg total) by mouth daily for 7 days. Arvilla Birmingham, MD  Expired 07/28/23 2359   mirtazapine  (REMERON ) 30 MG tablet [id number] No Take 1 tablet (30 mg total) by mouth at bedtime. Jadapalle, Sree, MD Taking Active   mirtazapine  (REMERON ) 30 MG tablet [id number]  Take 1 tablet (30 mg total) by mouth at bedtime for 7 days. Arvilla Birmingham, MD  Active   naphazoline-glycerin  (CLEAR EYES REDNESS) 0.012-0.25 % SOLN [id number] No Place 1-2 drops into both eyes 4 (four) times daily as needed for eye irritation. Aurelia Blotter, MD Taking Active   nicotine  (NICODERM CQ  - DOSED IN MG/24 HOURS) 21 mg/24hr patch [id number] No Place 1 patch (21 mg total) onto the skin daily. Aurelia Blotter, MD Taking Active   oxyCODONE -acetaminophen  (PERCOCET/ROXICET) 5-325 MG tablet [id number]  Take 1-2 tablets by mouth every 6 (six) hours as needed for severe pain (pain score 7-10). Steinl, Kevin, MD  Active   pantoprazole  (PROTONIX ) 40 MG tablet [id number] No Take 1 tablet (40 mg total) by mouth daily. Jadapalle, Sree, MD Taking Active   promethazine  (PHENERGAN ) 12.5 MG tablet [id number]  Take 1 tablet (12.5 mg total) by mouth every 6 (six) hours as needed for nausea or vomiting. Steinl, Kevin, MD  Active   temazepam  (RESTORIL ) 15 MG capsule [id number]  Take 1 capsule (  15 mg total) by mouth every 12 (twelve) hours as needed for up to 14 doses for sleep. Arvilla Birmingham, MD  Active   traZODone  (DESYREL ) 50 MG tablet [id number] No Take 1 tablet (50 mg total) by mouth at bedtime. Jadapalle, Sree, MD Taking Active   traZODone  (DESYREL ) 50 MG tablet [id number]  Take 1 tablet (50 mg total) by mouth at  bedtime for 7 days. Arvilla Birmingham, MD  Active             Home Care and Equipment/Supplies: Were Home Health Services Ordered?: NA Any new equipment or medical supplies ordered?: NA  Functional Questionnaire: Do you need assistance with bathing/showering or dressing?: No Do you need assistance with meal preparation?: No Do you need assistance with eating?: No Do you have difficulty maintaining continence: No Do you need assistance with getting out of bed/getting out of a chair/moving?: No Do you have difficulty managing or taking your medications?: No  Follow up appointments reviewed: PCP Follow-up appointment confirmed?: Yes Date of PCP follow-up appointment?: 08/06/23 Follow-up Provider: [facility] Follow-up appointment confirmed?: NA Do you need transportation to your follow-up appointment?: No Do you understand care options if your condition(s) worsen?: Yes-patient verbalized understanding    SIGNATURE Darrall Ellison, LPN [facility] Nurse Health Advisor Direct Dial [phone]

## 2023-08-03 ENCOUNTER — Encounter (HOSPITAL_COMMUNITY): Payer: Self-pay | Admitting: *Deleted

## 2023-08-03 ENCOUNTER — Other Ambulatory Visit: Payer: Self-pay

## 2023-08-03 ENCOUNTER — Emergency Department (HOSPITAL_COMMUNITY)
Admission: EM | Admit: 2023-08-03 | Discharge: 2023-08-03 | Discharge status: Against Medical Advice | Attending: Emergency Medicine | Admitting: Emergency Medicine

## 2023-08-03 ENCOUNTER — Ambulatory Visit: Admitting: Obstetrics and Gynecology

## 2023-08-03 DIAGNOSIS — K5900 Constipation, unspecified: Secondary | ICD-10-CM | POA: Insufficient documentation

## 2023-08-03 DIAGNOSIS — R110 Nausea: Secondary | ICD-10-CM | POA: Diagnosis not present

## 2023-08-03 DIAGNOSIS — Z5321 Procedure and treatment not carried out due to patient leaving prior to being seen by health care provider: Secondary | ICD-10-CM | POA: Diagnosis not present

## 2023-08-03 DIAGNOSIS — R109 Unspecified abdominal pain: Secondary | ICD-10-CM | POA: Diagnosis present

## 2023-08-03 LAB — CBC WITH DIFFERENTIAL/PLATELET
Abs Immature Granulocytes: 0.05 10*3/uL (ref 0.00–0.07)
Basophils Absolute: 0 10*3/uL (ref 0.0–0.1)
Basophils Relative: 0 %
Eosinophils Absolute: 0.4 10*3/uL (ref 0.0–0.5)
Eosinophils Relative: 3 %
HCT: 37.3 % (ref 36.0–46.0)
Hemoglobin: 12.1 g/dL (ref 12.0–15.0)
Immature Granulocytes: 0 %
Lymphocytes Relative: 32 %
Lymphs Abs: 4.2 10*3/uL — ABNORMAL HIGH (ref 0.7–4.0)
MCH: 26.8 pg (ref 26.0–34.0)
MCHC: 32.4 g/dL (ref 30.0–36.0)
MCV: 82.5 fL (ref 80.0–100.0)
Monocytes Absolute: 1 10*3/uL (ref 0.1–1.0)
Monocytes Relative: 8 %
Neutro Abs: 7.6 10*3/uL (ref 1.7–7.7)
Neutrophils Relative %: 57 %
Platelets: 491 10*3/uL — ABNORMAL HIGH (ref 150–400)
RBC: 4.52 MIL/uL (ref 3.87–5.11)
RDW: 18.7 % — ABNORMAL HIGH (ref 11.5–15.5)
WBC: 13.2 10*3/uL — ABNORMAL HIGH (ref 4.0–10.5)
nRBC: 0 % (ref 0.0–0.2)

## 2023-08-03 LAB — URINALYSIS, ROUTINE W REFLEX MICROSCOPIC
Bilirubin Urine: NEGATIVE
Glucose, UA: NEGATIVE mg/dL
Hgb urine dipstick: NEGATIVE
Ketones, ur: NEGATIVE mg/dL
Nitrite: NEGATIVE
Protein, ur: 30 mg/dL — AB
Specific Gravity, Urine: 1.016 (ref 1.005–1.030)
pH: 5 (ref 5.0–8.0)

## 2023-08-03 LAB — COMPREHENSIVE METABOLIC PANEL WITH GFR
ALT: 17 U/L (ref 0–44)
AST: 14 U/L — ABNORMAL LOW (ref 15–41)
Albumin: 3.7 g/dL (ref 3.5–5.0)
Alkaline Phosphatase: 72 U/L (ref 38–126)
Anion gap: 10 (ref 5–15)
BUN: 9 mg/dL (ref 6–20)
CO2: 20 mmol/L — ABNORMAL LOW (ref 22–32)
Calcium: 9.2 mg/dL (ref 8.9–10.3)
Chloride: 107 mmol/L (ref 98–111)
Creatinine, Ser: 0.98 mg/dL (ref 0.44–1.00)
GFR, Estimated: >60 mL/min (ref >60)
Glucose, Bld: 95 mg/dL (ref 70–99)
Potassium: 3.5 mmol/L (ref 3.5–5.1)
Sodium: 137 mmol/L (ref 135–145)
Total Bilirubin: 0.6 mg/dL (ref 0.0–1.2)
Total Protein: 7.5 g/dL (ref 6.5–8.1)

## 2023-08-03 LAB — LIPASE, BLOOD: Lipase: 31 U/L (ref 11–51)

## 2023-08-03 NOTE — ED Triage Notes (Addendum)
 Here by POV from home for continued L sided abd pain, and nausea. Seen here on 5/13 last week and dx'd with diverticulitis. Finished single ABT. Describes as not better, pain and nausea continue. Denies urinary sx, bleeding, fever, vomiting or diarrhea. Endorses constipation. Last BM 5/11. No relief with tylenol . Alert, NAD, calm, interactive. Rates pain 10/10.

## 2023-08-06 ENCOUNTER — Telehealth: Payer: Self-pay

## 2023-08-06 ENCOUNTER — Ambulatory Visit (INDEPENDENT_AMBULATORY_CARE_PROVIDER_SITE_OTHER): Admitting: Family Medicine

## 2023-08-06 VITALS — BP 132/84 | HR 73 | Wt 175.8 lb

## 2023-08-06 DIAGNOSIS — M25512 Pain in left shoulder: Secondary | ICD-10-CM

## 2023-08-06 DIAGNOSIS — M5431 Sciatica, right side: Secondary | ICD-10-CM

## 2023-08-06 DIAGNOSIS — R1032 Left lower quadrant pain: Secondary | ICD-10-CM | POA: Diagnosis not present

## 2023-08-06 DIAGNOSIS — K5790 Diverticulosis of intestine, part unspecified, without perforation or abscess without bleeding: Secondary | ICD-10-CM

## 2023-08-06 DIAGNOSIS — R112 Nausea with vomiting, unspecified: Secondary | ICD-10-CM | POA: Diagnosis not present

## 2023-08-06 DIAGNOSIS — M5432 Sciatica, left side: Secondary | ICD-10-CM

## 2023-08-06 MED ORDER — ONDANSETRON HCL 4 MG PO TABS: 4.0000 mg | ORAL_TABLET | Freq: Three times a day (TID) | ORAL | 0 refills | Status: DC | PRN

## 2023-08-06 MED ORDER — GABAPENTIN 300 MG PO CAPS: 300.0000 mg | ORAL_CAPSULE | Freq: Two times a day (BID) | ORAL | 1 refills | Status: DC

## 2023-08-06 NOTE — Telephone Encounter (Signed)
Called patient and she is aware

## 2023-08-06 NOTE — Telephone Encounter (Signed)
 Routing to office

## 2023-08-06 NOTE — Telephone Encounter (Signed)
 Copied from CRM [id number]. Topic: Clinical - Prescription Issue >> Aug 06, 2023 12:54 PM Lizabeth Riggs wrote: Reason for CRM:  gabapentin  (NEURONTIN ) 300 MG capsule was not sent to [facility] after her appointment today. Dr. Elvan Hamel wanted her to change dose to 2 times daily and Raeli is unsure of when to take.  Please send in new order with new dosage to [facility]

## 2023-08-06 NOTE — Progress Notes (Unsigned)
 Established Patient Office Visit  Subjective    Patient ID: Natalie Edwards, female    DOB: 1965-10-12  Age: 58 y.o. MRN: [id number]  CC:  Chief Complaint  Patient presents with   Medical Management of Chronic Issues   Nausea    Pain from diverticulitis      HPI Natalie Edwards presents with complaint of abdominal pain and nausea/vomiting. She would like some meds. She wants an evaluation by GI in the near future. She also reports bilateral shoulder pain that is worsening and beginning to interfere with her daily activities. She denies known trauma or injury.   Outpatient Encounter Medications as of 08/06/2023  Medication Sig   albuterol  (PROVENTIL ) (2.5 MG/3ML) 0.083% nebulizer solution Take 3 mLs (2.5 mg total) by nebulization every 2 (two) hours as needed for wheezing.   amLODipine  (NORVASC ) 10 MG tablet Take 1 tablet (10 mg total) by mouth daily.   amoxicillin -clavulanate (AUGMENTIN ) 875-125 MG tablet Take 1 tablet by mouth every 12 (twelve) hours.   dicyclomine  (BENTYL ) 20 MG tablet Take 1 tablet (20 mg total) by mouth 3 (three) times daily before meals.   FLUoxetine  (PROZAC ) 20 MG capsule Take 1 capsule (20 mg total) by mouth daily.   hydrochlorothiazide  (HYDRODIURIL ) 25 MG tablet Take 1 tablet (25 mg total) by mouth daily.   mirtazapine  (REMERON ) 30 MG tablet Take 1 tablet (30 mg total) by mouth at bedtime.   naphazoline-glycerin  (CLEAR EYES REDNESS) 0.012-0.25 % SOLN Place 1-2 drops into both eyes 4 (four) times daily as needed for eye irritation.   nicotine  (NICODERM CQ  - DOSED IN MG/24 HOURS) 21 mg/24hr patch Place 1 patch (21 mg total) onto the skin daily.   ondansetron  (ZOFRAN ) 4 MG tablet Take 1 tablet (4 mg total) by mouth every 8 (eight) hours as needed for nausea or vomiting.   oxyCODONE -acetaminophen  (PERCOCET/ROXICET) 5-325 MG tablet Take 1-2 tablets by mouth every 6 (six) hours as needed for severe pain (pain score 7-10).   pantoprazole  (PROTONIX ) 40 MG tablet Take 1  tablet (40 mg total) by mouth daily.   promethazine  (PHENERGAN ) 12.5 MG tablet Take 1 tablet (12.5 mg total) by mouth every 6 (six) hours as needed for nausea or vomiting.   temazepam  (RESTORIL ) 15 MG capsule Take 1 capsule (15 mg total) by mouth every 12 (twelve) hours as needed for up to 14 doses for sleep.   traZODone  (DESYREL ) 50 MG tablet Take 1 tablet (50 mg total) by mouth at bedtime.   [DISCONTINUED] gabapentin  (NEURONTIN ) 300 MG capsule Take 1 capsule (300 mg total) by mouth at bedtime.   FLUoxetine  (PROZAC ) 20 MG tablet Take 1 tablet (20 mg total) by mouth daily for 7 days.   gabapentin  (NEURONTIN ) 300 MG capsule Take 1 capsule (300 mg total) by mouth 2 (two) times daily.   hydrochlorothiazide  (HYDRODIURIL ) 25 MG tablet Take 1 tablet (25 mg total) by mouth daily for 7 days.   mirtazapine  (REMERON ) 30 MG tablet Take 1 tablet (30 mg total) by mouth at bedtime for 7 days.   traZODone  (DESYREL ) 50 MG tablet Take 1 tablet (50 mg total) by mouth at bedtime for 7 days.   No facility-administered encounter medications on file as of 08/06/2023.    Past Medical History:  Diagnosis Date   Allergy    Anemia    Diverticulosis    Gall bladder stones 1993   Gastric ulcer    GERD (gastroesophageal reflux disease)    Hypertension    Renal mass  Past Surgical History:  Procedure Laterality Date   ABDOMINAL HYSTERECTOMY     BIOPSY  09/29/2022   Procedure: BIOPSY;  Surgeon: Sergio Dandy, MD;  Location: [facility] ENDOSCOPY;  Service: Gastroenterology;;   CHOLECYSTECTOMY     COLONOSCOPY WITH ESOPHAGOGASTRODUODENOSCOPY (EGD)  04/07/2023   [facility]   ESOPHAGOGASTRODUODENOSCOPY (EGD) WITH PROPOFOL  N/A 09/29/2022   Procedure: ESOPHAGOGASTRODUODENOSCOPY (EGD) WITH PROPOFOL ;  Surgeon: Sergio Dandy, MD;  Location: [facility] ENDOSCOPY;  Service: Gastroenterology;  Laterality: N/A;    Family History  Problem Relation Age of Onset   Cancer Maternal Uncle        Lung   Cancer Maternal  Uncle        Lung   Cancer Maternal Uncle        Lung   Headache Neg Hx        "I don't think so"   Migraines Neg Hx        "I don't think so"    Social History   Socioeconomic History   Marital status: Single    Spouse name: Not on file   Number of children: 3   Years of education: Not on file   Highest education level: 12th grade  Occupational History   Occupation: environmental services at [employer]  Tobacco Use   Smoking status: Every Day    Current packs/day: 0.50    Average packs/day: 0.5 packs/day for 25.0 years (12.5 ttl pk-yrs)    Types: Cigarettes   Smokeless tobacco: Never   Tobacco comments:    Pt tried to quit - helps with anxiety     1 pack last patient 3 days   Vaping Use   Vaping status: Never Used  Substance and Sexual Activity   Alcohol use: Yes    Comment: occasionally - last drink was July 4, 1 shot   Drug use: No   Sexual activity: Yes    Comment: hysterectomy  Other Topics Concern   Not on file  Social History Narrative   Lives at home in an apartment. Her mother lives with her.    Right handed   Caffeine: drinks approx. 36 oz of pepsi per day. Sometimes drinks 2 cups of coffee in a day as well.    Social Drivers of Corporate investment banker Strain: Low Risk  (03/06/2023)   Overall Financial Resource Strain (CARDIA)    Difficulty of Paying Living Expenses: Not very hard  Food Insecurity: Patient Declined (05/26/2023)   Hunger Vital Sign    Worried About Running Out of Food in the Last Year: Patient declined    Ran Out of Food in the Last Year: Patient declined  Recent Concern: Food Insecurity - Food Insecurity Present (05/24/2023)   Hunger Vital Sign    Worried About Running Out of Food in the Last Year: Sometimes true    Ran Out of Food in the Last Year: Often true  Transportation Needs: No Transportation Needs (05/26/2023)   PRAPARE - Administrator, Civil Service (Medical): No    Lack of Transportation (Non-Medical): No   Recent Concern: Transportation Needs - Unmet Transportation Needs (05/24/2023)   PRAPARE - Administrator, Civil Service (Medical): Yes    Lack of Transportation (Non-Medical): No  Physical Activity: Sufficiently Active (05/22/2022)   Exercise Vital Sign    Days of Exercise per Week: 4 days    Minutes of Exercise per Session: 70 min  Stress: Stress Concern Present (03/06/2023)   [place]  [employer]stitute of Occupational Health - Occupational Stress Questionnaire    Feeling of Stress : Very much  Social Connections: Socially Isolated (05/26/2023)   Social Connection and Isolation Panel [NHANES]    Frequency of Communication with Friends and Family: More than three times a week    Frequency of Social Gatherings with Friends and Family: More than three times a week    Attends Religious Services: Never    Database administrator or Organizations: No    Attends Banker Meetings: Never    Marital Status: Divorced  Catering manager Violence: Not At Risk (06/21/2023)   Received from [facility]   HITS    Over the last 12 months how often did your partner physically hurt you?: Never    Over the last 12 months how often did your partner insult you or talk down to you?: Never    Over the last 12 months how often did your partner threaten you with physical harm?: Never    Over the last 12 months how often did your partner scream or curse at you?: Never    Review of Systems  Constitutional:  Negative for chills, fever and weight loss.  Gastrointestinal:  Positive for abdominal pain, nausea and vomiting.  All other systems reviewed and are negative.       Objective    BP 132/84 (BP Location: Right Arm, Patient Position: Sitting, Cuff Size: Normal)   Pulse 73   Wt 175 lb 12.8 oz (79.7 kg)   SpO2 94%   BMI 30.18 kg/m   Physical Exam Vitals and nursing note reviewed.  Constitutional:      General: She is not in acute distress. Cardiovascular:     Rate and Rhythm: Normal  rate and regular rhythm.  Pulmonary:     Effort: Pulmonary effort is normal.     Breath sounds: Normal breath sounds.  Abdominal:     Palpations: Abdomen is soft.     Tenderness: There is no abdominal tenderness.  Musculoskeletal:     Left shoulder: Tenderness present. No swelling or deformity. Decreased range of motion.  Neurological:     General: No focal deficit present.     Mental Status: She is alert and oriented to person, place, and time.         Assessment & Plan:   Left lower quadrant abdominal pain -     Ambulatory referral to Gastroenterology  Nausea and vomiting, unspecified vomiting type -     Ambulatory referral to Gastroenterology  Diverticular disease -     Ambulatory referral to Gastroenterology  Left shoulder pain, unspecified chronicity -     Ambulatory referral to Orthopedic Surgery  Bilateral sciatica -     Gabapentin ; Take 1 capsule (300 mg total) by mouth 2 (two) times daily.  Dispense: 60 capsule; Refill: 1  Other orders -     Ondansetron  HCl; Take 1 tablet (4 mg total) by mouth every 8 (eight) hours as needed for nausea or vomiting.  Dispense: 20 tablet; Refill: 0     No follow-ups on file.   Arlo Lama, MD

## 2023-08-11 ENCOUNTER — Telehealth: Payer: Self-pay | Admitting: *Deleted

## 2023-08-11 NOTE — Telephone Encounter (Signed)
 Copied from CRM [id number]. Topic: Appointments - Appointment Scheduling >> Aug 11, 2023 11:22 AM Natalie Edwards wrote: Patient is calling in to schedule their Annual Wellness Visit, schedule isn't pulling up any dates.

## 2023-08-12 ENCOUNTER — Encounter: Payer: Self-pay | Admitting: Family Medicine

## 2023-08-12 ENCOUNTER — Other Ambulatory Visit: Payer: Self-pay | Admitting: Nurse Practitioner

## 2023-08-12 NOTE — Telephone Encounter (Signed)
**Note De-identified  [place] Obfuscation** Please advise 

## 2023-08-12 NOTE — Telephone Encounter (Signed)
 Copied from CRM [id number]. Topic: Clinical - Medication Refill >> Aug 12, 2023  1:38 PM Natalie Edwards wrote: Medication: temazepam  (RESTORIL ) 15 MG capsule  Has the patient contacted their pharmacy? No (Agent: If no, request that the patient contact the pharmacy for the refill. If patient does not wish to contact the pharmacy document the reason why and proceed with request.) (Agent: If yes, when and what did the pharmacy advise?)  This is the patient's preferred pharmacy:  [facility] [address] ([address] [place] - [address] [place] ([place]) [place] [address] Phone: [phone] Fax: [phone]    Is this the correct pharmacy for this prescription? Yes If no, delete pharmacy and type the correct one.   Has the prescription been filled recently? No  Is the patient out of the medication? Yes  Has the patient been seen for an appointment in the last year OR does the patient have an upcoming appointment? Yes  Can we respond through MyChart? Yes  Agent: Please be advised that Rx refills may take up to 3 business days. We ask that you follow-up with your pharmacy.

## 2023-08-13 ENCOUNTER — Ambulatory Visit: Admitting: Physician Assistant

## 2023-08-17 NOTE — Telephone Encounter (Signed)
 Sent message to Corbin Dess who schedules for AWV with Millie Alm

## 2023-08-18 ENCOUNTER — Other Ambulatory Visit: Payer: Self-pay

## 2023-08-18 ENCOUNTER — Encounter: Payer: Self-pay | Admitting: Physician Assistant

## 2023-08-18 ENCOUNTER — Ambulatory Visit (INDEPENDENT_AMBULATORY_CARE_PROVIDER_SITE_OTHER): Admitting: Physician Assistant

## 2023-08-18 DIAGNOSIS — M25512 Pain in left shoulder: Secondary | ICD-10-CM

## 2023-08-18 DIAGNOSIS — G8929 Other chronic pain: Secondary | ICD-10-CM | POA: Diagnosis not present

## 2023-08-18 MED ORDER — METHYLPREDNISOLONE ACETATE 40 MG/ML IJ SUSP
40.0000 mg | INTRAMUSCULAR | Status: AC | PRN
  Administered 2023-08-18: 40 mg via INTRA_ARTICULAR

## 2023-08-18 MED ORDER — LIDOCAINE HCL 1 % IJ SOLN
5.0000 mL | INTRAMUSCULAR | Status: AC | PRN
  Administered 2023-08-18: 5 mL

## 2023-08-18 NOTE — Progress Notes (Signed)
 Office Visit Note   Patient: Natalie Edwards           Date of Birth: 30-Jun-1965           MRN: [id number] Visit Date: 08/18/2023              Requested by: Abraham Abo, MD [address] [place],  [place] [address] PCP: Abraham Abo, MD   Assessment & Plan: Visit Diagnoses:  1. Chronic left shoulder pain     Plan: Patient is a pleasant 58 year old woman with a history of left shoulder pain for couple months.  She just states she had a fall but did not have any bruising at the time and did not think she had any problems.  She now has noticed more pain in the shoulder especially with going behind her back and trying to raise her arm above her head.  Exam is consistent with some rotator cuff tendinitis.  She might have early frozen shoulder as well.  There she also has Popeye deformity and pain with speeds testing indicating that she may have torn her proximal biceps as well.  We talked about the natural history of this would like to go forward with an injection and some physical therapy will follow-up in a month if no better could consider an MRI.  Follow-Up Instructions: Return in about 1 month (around 09/17/2023).   Orders:  Orders Placed This Encounter  Procedures  . XR Shoulder Left   No orders of the defined types were placed in this encounter.     Procedures: Large Joint Inj: L subacromial bursa on 08/18/2023 11:22 AM Indications: diagnostic evaluation and pain Details: 25 G 1.5 in needle, posterior approach  Arthrogram: No  Medications: 5 mL lidocaine  1 %; 40 mg methylPREDNISolone  acetate 40 MG/ML Outcome: tolerated well, no immediate complications Procedure, treatment alternatives, risks and benefits explained, specific risks discussed. Consent was given by the patient.     Clinical Data: No additional findings.   Subjective: No chief complaint on file.   HPI patient is a pleasant 58 year old woman right-hand-dominant who comes in today complaining of  left shoulder pain.  She did have a fall couple months ago but did not really think much of it as she had no ecchymosis and her shoulder really did not hurt.  But she has had increasing pain when she goes above her head and behind her back.  She denies any neck pain or stiffness.  She denies any paresthesias.  Review of Systems  All other systems reviewed and are negative.    Objective: Vital Signs: There were no vitals taken for this visit.  Physical Exam Constitutional:      Appearance: Normal appearance.  Pulmonary:     Effort: Pulmonary effort is normal.  Skin:    General: Skin is warm and dry.  Neurological:     General: No focal deficit present.     Mental Status: She is alert and oriented to person, place, and time.  Psychiatric:        Mood and Affect: Mood normal.        Behavior: Behavior normal.    Ortho Exam Examination of the left shoulder she has forward elevation to about 95 degrees is resistant by pain and some stiffness to go further than that.  She has very little internal rotation behind the back.  External rotation does not really bother her she has fair strength with resisted abduction.  She does have fullness in her  bicep and pain with biceps contraction.  Distal biceps is intact Specialty Comments:  No specialty comments available.  Imaging: No results found.   PMFS History: Patient Active Problem List   Diagnosis Date Noted  . Pain and swelling of lower extremity 05/28/2023  . MDD (major depressive disorder), recurrent episode, severe (HCC) 05/26/2023  . Ingestion of substance, intentional self-harm, sequela (HCC) 05/23/2023  . SIRS (systemic inflammatory response syndrome) (HCC) 05/23/2023  . Rhabdomyolysis 05/23/2023  . Cocaine abuse (HCC) 05/23/2023  . Intractable nausea and vomiting 01/18/2023  . MDD (major depressive disorder) 12/23/2022  . MDD (major depressive disorder), recurrent severe, without psychosis (HCC) 12/22/2022  . Grief  12/22/2022  . Acute diverticulitis 10/17/2022  . Melena 09/29/2022  . Gastric ulcer without hemorrhage or perforation 09/29/2022  . AKI (acute kidney injury) (HCC) 09/28/2022  . Metabolic acidosis 09/28/2022  . Epigastric pain 09/28/2022  . Nausea 09/28/2022  . Hyperlipidemia 12/14/2021  . Prediabetes 12/14/2021  . Anxiety and depression 05/14/2021  . Breathing difficult 05/14/2021  . GERD (gastroesophageal reflux disease)   . Possible exposure to STD 01/14/2021  . Chronic insomnia 10/18/2019  . CKD (chronic kidney disease) 04/07/2019  . GAD (generalized anxiety disorder) 05/07/2018  . Chronic gastric ulcer 05/07/2018  . Essential hypertension 05/07/2018  . Iron  deficiency anemia due to chronic blood loss 09/23/2016  . Peptic ulcer disease with hemorrhage 09/23/2016  . Thrombocytosis 09/09/2016  . Leukocytosis/thrombocytosis 09/09/2016  . Gastric nodule 09/09/2016  . H/O ulcer disease 07/31/2016   Past Medical History:  Diagnosis Date  . Allergy   . Anemia   . Diverticulosis   . Gall bladder stones 1993  . Gastric ulcer   . GERD (gastroesophageal reflux disease)   . Hypertension   . Renal mass     Family History  Problem Relation Age of Onset  . Cancer Maternal Uncle        Lung  . Cancer Maternal Uncle        Lung  . Cancer Maternal Uncle        Lung  . Headache Neg Hx        "I don't think so"  . Migraines Neg Hx        "I don't think so"    Past Surgical History:  Procedure Laterality Date  . ABDOMINAL HYSTERECTOMY    . BIOPSY  09/29/2022   Procedure: BIOPSY;  Surgeon: Sergio Dandy, MD;  Location: [facility] ENDOSCOPY;  Service: Gastroenterology;;  . CHOLECYSTECTOMY    . COLONOSCOPY WITH ESOPHAGOGASTRODUODENOSCOPY (EGD)  04/07/2023   [facility]  . ESOPHAGOGASTRODUODENOSCOPY (EGD) WITH PROPOFOL  N/A 09/29/2022   Procedure: ESOPHAGOGASTRODUODENOSCOPY (EGD) WITH PROPOFOL ;  Surgeon: Sergio Dandy, MD;  Location: [facility] ENDOSCOPY;  Service:  Gastroenterology;  Laterality: N/A;   Social History   Occupational History  . Occupation: environmental services at [employer]  . Smoking status: Every Day    Current packs/day: 0.50    Average packs/day: 0.5 packs/day for 25.0 years (12.5 ttl pk-yrs)    Types: Cigarettes  . Smokeless tobacco: Never  . Tobacco comments:    Pt tried to quit - helps with anxiety     1 pack last patient 3 days   Vaping Use  . Vaping status: Never Used  Substance and Sexual Activity  . Alcohol use: Yes    Comment: occasionally - last drink was July 4, 1 shot  . Drug use: No  . Sexual activity: Yes    Comment: hysterectomy

## 2023-08-30 ENCOUNTER — Emergency Department (HOSPITAL_COMMUNITY)

## 2023-08-30 ENCOUNTER — Emergency Department (HOSPITAL_COMMUNITY)
Admission: EM | Admit: 2023-08-30 | Discharge: 2023-08-30 | Disposition: A | Discharge status: Home / Self Care | Attending: Emergency Medicine | Admitting: Emergency Medicine

## 2023-08-30 ENCOUNTER — Encounter (HOSPITAL_COMMUNITY): Payer: Self-pay | Admitting: Emergency Medicine

## 2023-08-30 ENCOUNTER — Other Ambulatory Visit: Payer: Self-pay

## 2023-08-30 DIAGNOSIS — R1032 Left lower quadrant pain: Secondary | ICD-10-CM | POA: Insufficient documentation

## 2023-08-30 DIAGNOSIS — D72829 Elevated white blood cell count, unspecified: Secondary | ICD-10-CM | POA: Diagnosis not present

## 2023-08-30 DIAGNOSIS — N189 Chronic kidney disease, unspecified: Secondary | ICD-10-CM | POA: Diagnosis not present

## 2023-08-30 LAB — CBC
HCT: 33.6 % — ABNORMAL LOW (ref 36.0–46.0)
Hemoglobin: 10.6 g/dL — ABNORMAL LOW (ref 12.0–15.0)
MCH: 27 pg (ref 26.0–34.0)
MCHC: 31.5 g/dL (ref 30.0–36.0)
MCV: 85.5 fL (ref 80.0–100.0)
Platelets: 479 10*3/uL — ABNORMAL HIGH (ref 150–400)
RBC: 3.93 MIL/uL (ref 3.87–5.11)
RDW: 18.9 % — ABNORMAL HIGH (ref 11.5–15.5)
WBC: 15.8 10*3/uL — ABNORMAL HIGH (ref 4.0–10.5)
nRBC: 0 % (ref 0.0–0.2)

## 2023-08-30 LAB — LIPASE, BLOOD: Lipase: 40 U/L (ref 11–51)

## 2023-08-30 LAB — URINALYSIS, ROUTINE W REFLEX MICROSCOPIC
Bilirubin Urine: NEGATIVE
Glucose, UA: NEGATIVE mg/dL
Hgb urine dipstick: NEGATIVE
Ketones, ur: NEGATIVE mg/dL
Leukocytes,Ua: NEGATIVE
Nitrite: NEGATIVE
Protein, ur: NEGATIVE mg/dL
Specific Gravity, Urine: 1.019 (ref 1.005–1.030)
pH: 5 (ref 5.0–8.0)

## 2023-08-30 LAB — COMPREHENSIVE METABOLIC PANEL WITH GFR
ALT: 13 U/L (ref 0–44)
AST: 15 U/L (ref 15–41)
Albumin: 3.2 g/dL — ABNORMAL LOW (ref 3.5–5.0)
Alkaline Phosphatase: 61 U/L (ref 38–126)
Anion gap: 7 (ref 5–15)
BUN: 22 mg/dL — ABNORMAL HIGH (ref 6–20)
CO2: 23 mmol/L (ref 22–32)
Calcium: 8.6 mg/dL — ABNORMAL LOW (ref 8.9–10.3)
Chloride: 111 mmol/L (ref 98–111)
Creatinine, Ser: 1.05 mg/dL — ABNORMAL HIGH (ref 0.44–1.00)
GFR, Estimated: >60 mL/min (ref >60)
Glucose, Bld: 103 mg/dL — ABNORMAL HIGH (ref 70–99)
Potassium: 4 mmol/L (ref 3.5–5.1)
Sodium: 141 mmol/L (ref 135–145)
Total Bilirubin: 0.6 mg/dL (ref 0.0–1.2)
Total Protein: 6.6 g/dL (ref 6.5–8.1)

## 2023-08-30 MED ORDER — HYDROMORPHONE HCL 1 MG/ML IJ SOLN
1.0000 mg | Freq: Once | INTRAMUSCULAR | Status: AC | PRN
  Administered 2023-08-30: 1 mg via INTRAVENOUS
  Filled 2023-08-30: qty 1

## 2023-08-30 MED ORDER — OXYCODONE-ACETAMINOPHEN 5-325 MG PO TABS: 1.0000 | ORAL_TABLET | Freq: Three times a day (TID) | ORAL | 0 refills | Status: DC | PRN

## 2023-08-30 MED ORDER — ACETAMINOPHEN 500 MG PO TABS: 500.0000 mg | ORAL_TABLET | Freq: Four times a day (QID) | ORAL | 0 refills | Status: DC | PRN

## 2023-08-30 MED ORDER — MORPHINE SULFATE (PF) 4 MG/ML IV SOLN
4.0000 mg | Freq: Once | INTRAVENOUS | Status: AC
  Administered 2023-08-30: 4 mg via INTRAVENOUS
  Filled 2023-08-30: qty 1

## 2023-08-30 MED ORDER — IOHEXOL 300 MG/ML  SOLN
100.0000 mL | Freq: Once | INTRAMUSCULAR | Status: AC | PRN
  Administered 2023-08-30: 100 mL via INTRAVENOUS

## 2023-08-30 NOTE — ED Notes (Signed)
With CT 

## 2023-08-30 NOTE — ED Triage Notes (Addendum)
 Pt bib EMS from home for lower left abdominal pain. Pt previously diagnosed diverticulitis and was placed on antibiotic and pain meds. Antibiotic just finished yesterday 08/29/23. Pt c/o nausea, diarrhea, headache. CBG 136 BP 136/84 HR 94 RR 20

## 2023-08-30 NOTE — ED Notes (Signed)
 Patient does not have a ride home. States she was going to call a [employer].

## 2023-08-30 NOTE — ED Provider Notes (Signed)
 [place] [facility]MENT AT [facility] Provider Note   CSN: [id number] Arrival date & time: 08/30/23  1746     Patient presents with: Abdominal Pain   Natalie Edwards is a 58 y.o. female.   HPI     58 year old female comes in with chief complaint of abdominal pain.  Patient has history of thrombocytosis, PUD, CKD, diverticulitis and documented history of cocaine use.  Patient states that she has been having abdominal pain for the last several days.  She was seen in the ER last month and diagnosed with diverticulitis.  Subsequently she was in [place]  last week, and on Monday she had to go to an ER there.  She had a CT scan and was diagnosed with diverticulitis, she just finished her antibiotics yesterday.  Patient has never been pain-free, but the pain started getting worse today.  Pain is located in the left lower quadrant.  Patient denies any associated burning with urination, blood in the urine, bloody stools.  She has had some nausea and diarrhea.  Prior to Admission medications   Medication Sig Start Date End Date Taking? Authorizing Provider  acetaminophen  (TYLENOL ) 500 MG tablet Take 1 tablet (500 mg total) by mouth every 6 (six) hours as needed. 08/30/23  Yes Deatra Face, MD  amLODipine  (NORVASC ) 10 MG tablet Take 1 tablet (10 mg total) by mouth daily. 06/11/23  Yes Jadapalle, Sree, MD  FLUoxetine  (PROZAC ) 20 MG tablet Take 20 mg by mouth daily.   Yes [provider]  gabapentin  (NEURONTIN ) 300 MG capsule Take 1 capsule (300 mg total) by mouth 2 (two) times daily. Patient taking differently: Take 300 mg by mouth 2 (two) times daily as needed (for shoulder pain). 08/06/23  Yes Abraham Abo, MD  hydrOXYzine  (ATARAX ) 50 MG tablet Take 50 mg by mouth 2 (two) times daily as needed for anxiety.   Yes [provider]  mirtazapine  (REMERON ) 15 MG tablet Take 15 mg by mouth at bedtime.   Yes [provider]  naphazoline-glycerin  (CLEAR  EYES REDNESS) 0.012-0.25 % SOLN Place 1-2 drops into both eyes 4 (four) times daily as needed for eye irritation. 06/11/23  Yes Jadapalle, Sree, MD  ondansetron  (ZOFRAN ) 4 MG tablet Take 1 tablet (4 mg total) by mouth every 8 (eight) hours as needed for nausea or vomiting. 08/06/23  Yes Abraham Abo, MD  oxyCODONE -acetaminophen  (PERCOCET/ROXICET) 5-325 MG tablet Take 1 tablet by mouth 3 (three) times daily as needed (for pain).   Yes [provider]  oxyCODONE -acetaminophen  (PERCOCET/ROXICET) 5-325 MG tablet Take 1 tablet by mouth every 8 (eight) hours as needed for severe pain (pain score 7-10). 08/30/23  Yes Deatra Face, MD  pregabalin  (LYRICA ) 75 MG capsule Take 75 mg by mouth 2 (two) times daily.   Yes [provider]  promethazine  (PHENERGAN ) 12.5 MG tablet Take 1 tablet (12.5 mg total) by mouth every 6 (six) hours as needed for nausea or vomiting. 07/28/23  Yes Guadalupe Lee, MD  risperiDONE  (RISPERDAL ) 1 MG tablet Take 1 mg by mouth 2 (two) times daily.   Yes [provider]  traZODone  (DESYREL ) 100 MG tablet Take 100 mg by mouth at bedtime.   Yes [provider]  TYLENOL  500 MG tablet Take 500-1,000 mg by mouth every 6 (six) hours as needed for mild pain (pain score 1-3) or headache.   Yes [provider]  albuterol  (PROVENTIL ) (2.5 MG/3ML) 0.083% nebulizer solution Take 3 mLs (2.5 mg total) by nebulization every 2 (two)  hours as needed for wheezing. Patient not taking: Reported on 08/30/2023 06/11/23   Jadapalle, Sree, MD  dicyclomine  (BENTYL ) 20 MG tablet Take 1 tablet (20 mg total) by mouth 3 (three) times daily before meals. Patient not taking: Reported on 08/30/2023 06/11/23   Jadapalle, Sree, MD  FLUoxetine  (PROZAC ) 20 MG capsule Take 1 capsule (20 mg total) by mouth daily. Patient not taking: Reported on 08/30/2023 06/11/23   Jadapalle, Sree, MD  hydrochlorothiazide  (HYDRODIURIL ) 25 MG tablet Take 1 tablet (25 mg total) by mouth daily. Patient  not taking: Reported on 08/30/2023 06/12/23   Jadapalle, Sree, MD  hydrochlorothiazide  (HYDRODIURIL ) 25 MG tablet Take 1 tablet (25 mg total) by mouth daily for 7 days. Patient not taking: Reported on 08/30/2023 07/21/23 08/30/23  Arvilla Birmingham, MD  mirtazapine  (REMERON ) 30 MG tablet Take 1 tablet (30 mg total) by mouth at bedtime. Patient not taking: Reported on 08/30/2023 06/11/23   Jadapalle, Sree, MD  nicotine  (NICODERM CQ  - DOSED IN MG/24 HOURS) 21 mg/24hr patch Place 1 patch (21 mg total) onto the skin daily. Patient not taking: Reported on 08/30/2023 06/12/23   Jadapalle, Sree, MD  pantoprazole  (PROTONIX ) 40 MG tablet Take 1 tablet (40 mg total) by mouth daily. Patient not taking: Reported on 08/30/2023 06/11/23   Jadapalle, Sree, MD  traZODone  (DESYREL ) 50 MG tablet Take 1 tablet (50 mg total) by mouth at bedtime. Patient not taking: Reported on 08/30/2023 06/11/23   Jadapalle, Sree, MD    Allergies: Hydroxyzine , Nsaids, Ambien  [zolpidem  tartrate], and Ibuprofen     Review of Systems  All other systems reviewed and are negative.   Updated Vital Signs BP (!) 128/92   Pulse 82   Temp 98.6 F (37 C) (Oral)   Resp 15   Ht 5' 4 (1.626 m)   Wt 79.8 kg   SpO2 100%   BMI 30.21 kg/m   Physical Exam Vitals and nursing note reviewed.  Constitutional:      Appearance: She is well-developed.  HENT:     Head: Atraumatic.   Cardiovascular:     Rate and Rhythm: Normal rate.  Pulmonary:     Effort: Pulmonary effort is normal.  Abdominal:     Tenderness: There is abdominal tenderness in the suprapubic area and left lower quadrant. There is guarding. There is no rebound.   Musculoskeletal:     Cervical back: Normal range of motion and neck supple.   Skin:    General: Skin is warm and dry.   Neurological:     Mental Status: She is alert and oriented to person, place, and time.     (all labs ordered are listed, but only abnormal results are displayed) Labs Reviewed  COMPREHENSIVE  METABOLIC PANEL WITH GFR - Abnormal; Notable for the following components:      Result Value   Glucose, Bld 103 (*)    BUN 22 (*)    Creatinine, Ser 1.05 (*)    Calcium  8.6 (*)    Albumin 3.2 (*)    All other components within normal limits  CBC - Abnormal; Notable for the following components:   WBC 15.8 (*)    Hemoglobin 10.6 (*)    HCT 33.6 (*)    RDW 18.9 (*)    Platelets 479 (*)    All other components within normal limits  LIPASE, BLOOD  URINALYSIS, ROUTINE W REFLEX MICROSCOPIC    EKG: None  Radiology: CT ABDOMEN PELVIS W CONTRAST Result Date: 08/30/2023 CLINICAL DATA:  Left lower  abdominal pain, history of diverticulitis EXAM: CT ABDOMEN AND PELVIS WITH CONTRAST TECHNIQUE: Multidetector CT imaging of the abdomen and pelvis was performed using the standard protocol following bolus administration of intravenous contrast. RADIATION DOSE REDUCTION: This exam was performed according to the departmental dose-optimization program which includes automated exposure control, adjustment of the mA and/or kV according to patient size and/or use of iterative reconstruction technique. CONTRAST:  OMNIPAQUE  IOHEXOL  300 MG/ML  SOLN COMPARISON:  07/28/2023 FINDINGS: Lower chest: No acute pleural or parenchymal lung disease. Hepatobiliary: No focal liver abnormality is seen. Status post cholecystectomy. No biliary dilatation. Pancreas: Unremarkable. No pancreatic ductal dilatation or surrounding inflammatory changes. Spleen: Normal in size without focal abnormality. Adrenals/Urinary Tract: Stable 1.7 cm left adrenal nodule previously documented as adenoma. Right adrenal is unremarkable. Stable bilateral renal cortical cysts which do not require specific imaging follow-up. No urinary tract calculi or obstructive uropathy. The bladder is unremarkable. Stomach/Bowel: No bowel obstruction or ileus. Normal appendix right lower quadrant. Diverticulosis of the descending and sigmoid colon, with no evidence  of acute diverticulitis. No bowel wall thickening or inflammatory change. Vascular/Lymphatic: Aortic atherosclerosis. No enlarged abdominal or pelvic lymph nodes. Reproductive: Status post hysterectomy. No adnexal masses. Other: No free fluid or free intraperitoneal gas. No abdominal wall hernia. Musculoskeletal: No acute or destructive bony abnormalities. Reconstructed images demonstrate no additional findings. IMPRESSION: 1. Distal colonic diverticulosis with no evidence of acute diverticulitis. 2.  Aortic Atherosclerosis (ICD10-I70.0). Electronically Signed   By: Bobbye Burrow M.D.   On: 08/30/2023 20:18     Procedures   Medications Ordered in the ED  morphine  (PF) 4 MG/ML injection 4 mg (4 mg Intravenous Given 08/30/23 1852)  HYDROmorphone  (DILAUDID ) injection 1 mg (1 mg Intravenous Given 08/30/23 2007)  iohexol  (OMNIPAQUE ) 300 MG/ML solution 100 mL (100 mLs Intravenous Contrast Given 08/30/23 1924)                                    Medical Decision Making Amount and/or Complexity of Data Reviewed Labs: ordered. Radiology: ordered.  Risk Prescription drug management.   This patient presents to the ED with chief complaint(s) of abdominal pain with pertinent past medical history of diverticulitis.The complaint involves an extensive differential diagnosis and also carries with it a high risk of complications and morbidity.    The differential diagnosis includes : Diverticulitis, complication from diverticulitis including intra-abdominal abscess, perforation, contained abscess, ileus, small bowel obstruction, kidney stone  The initial plan is to get basic labs and consider CT abdomen pelvis with contrast.   Additional history obtained: Records reviewed previous CT findings.  CT in May revealed diverticulitis.  Independent labs interpretation:  The following labs were independently interpreted: Elevated white count of 15.8.  Therefore we will pursue CT.  Metabolic profile is  normal.  Independent visualization and interpretation of imaging: - I independently visualized the following imaging with scope of interpretation limited to determining acute life threatening conditions related to emergency care: CT abdomen pelvis, which revealed no perforation.  Per radiologist, there is no diverticulitis.  Treatment and Reassessment: The patient appears reasonably screened and/or stabilized for discharge and I doubt any other medical condition or other [facility] requiring further screening, evaluation, or treatment in the ED at this time prior to discharge.   Results from the ER workup discussed with the patient face to face and all questions answered to the best of my ability.  I discussed with  her the reassuring CT finding, but the need for her to come back if her symptoms worsen or if she starts having fevers, bloody stools, bloody diarrhea or severe nausea and vomiting.  The patient is safe for discharge with strict return precautions.     Final diagnoses:  Left lower quadrant abdominal pain    ED Discharge Orders          Ordered    oxyCODONE -acetaminophen  (PERCOCET/ROXICET) 5-325 MG tablet  Every 8 hours PRN        08/30/23 2054    acetaminophen  (TYLENOL ) 500 MG tablet  Every 6 hours PRN        08/30/23 2054               Deatra Face, MD 08/30/23 2058

## 2023-08-30 NOTE — Discharge Instructions (Signed)
We saw you in the ER for abdominal discomfort. The results of our workup, including labs and imaging are reassuring at this time.   Symptoms can evolve, therefore please return to the ER if you have increased pain, fevers, chills, inability to keep any medications down, confusion, sweating. Otherwise see your primary care doctor in 2-3 days for further evaluation.

## 2023-09-02 ENCOUNTER — Ambulatory Visit: Admitting: Gastroenterology

## 2023-09-02 ENCOUNTER — Other Ambulatory Visit (INDEPENDENT_AMBULATORY_CARE_PROVIDER_SITE_OTHER)

## 2023-09-02 ENCOUNTER — Encounter: Payer: Self-pay | Admitting: Gastroenterology

## 2023-09-02 VITALS — BP 130/82 | HR 78 | Ht 64.0 in | Wt 182.0 lb

## 2023-09-02 DIAGNOSIS — R630 Anorexia: Secondary | ICD-10-CM

## 2023-09-02 DIAGNOSIS — R1032 Left lower quadrant pain: Secondary | ICD-10-CM

## 2023-09-02 DIAGNOSIS — K5732 Diverticulitis of large intestine without perforation or abscess without bleeding: Secondary | ICD-10-CM

## 2023-09-02 DIAGNOSIS — K921 Melena: Secondary | ICD-10-CM | POA: Diagnosis not present

## 2023-09-02 DIAGNOSIS — K219 Gastro-esophageal reflux disease without esophagitis: Secondary | ICD-10-CM

## 2023-09-02 DIAGNOSIS — R112 Nausea with vomiting, unspecified: Secondary | ICD-10-CM | POA: Diagnosis not present

## 2023-09-02 DIAGNOSIS — Z8719 Personal history of other diseases of the digestive system: Secondary | ICD-10-CM

## 2023-09-02 DIAGNOSIS — D500 Iron deficiency anemia secondary to blood loss (chronic): Secondary | ICD-10-CM

## 2023-09-02 DIAGNOSIS — Z8711 Personal history of peptic ulcer disease: Secondary | ICD-10-CM

## 2023-09-02 DIAGNOSIS — D72825 Bandemia: Secondary | ICD-10-CM

## 2023-09-02 DIAGNOSIS — D75839 Thrombocytosis, unspecified: Secondary | ICD-10-CM

## 2023-09-02 LAB — CBC WITH DIFFERENTIAL/PLATELET
Basophils Absolute: 0.1 10*3/uL (ref 0.0–0.1)
Basophils Relative: 0.6 % (ref 0.0–3.0)
Eosinophils Absolute: 0.2 10*3/uL (ref 0.0–0.7)
Eosinophils Relative: 1.9 % (ref 0.0–5.0)
HCT: 38.4 % (ref 36.0–46.0)
Hemoglobin: 12.3 g/dL (ref 12.0–15.0)
Lymphocytes Relative: 35.9 % (ref 12.0–46.0)
Lymphs Abs: 4.3 10*3/uL — ABNORMAL HIGH (ref 0.7–4.0)
MCHC: 32 g/dL (ref 30.0–36.0)
MCV: 81.1 fl (ref 78.0–100.0)
Monocytes Absolute: 1 10*3/uL (ref 0.1–1.0)
Monocytes Relative: 8 % (ref 3.0–12.0)
Neutro Abs: 6.5 10*3/uL (ref 1.4–7.7)
Neutrophils Relative %: 53.6 % (ref 43.0–77.0)
Platelets: 564 10*3/uL — ABNORMAL HIGH (ref 150.0–400.0)
RBC: 4.74 Mil/uL (ref 3.87–5.11)
RDW: 18.4 % — ABNORMAL HIGH (ref 11.5–15.5)
WBC: 12.1 10*3/uL — ABNORMAL HIGH (ref 4.0–10.5)

## 2023-09-02 LAB — COMPREHENSIVE METABOLIC PANEL WITH GFR
ALT: 13 U/L (ref 0–35)
AST: 13 U/L (ref 0–37)
Albumin: 4.4 g/dL (ref 3.5–5.2)
Alkaline Phosphatase: 71 U/L (ref 39–117)
BUN: 13 mg/dL (ref 6–23)
CO2: 31 meq/L (ref 19–32)
Calcium: 9.4 mg/dL (ref 8.4–10.5)
Chloride: 103 meq/L (ref 96–112)
Creatinine, Ser: 1.04 mg/dL (ref 0.40–1.20)
GFR: 59.1 mL/min — ABNORMAL LOW
Glucose, Bld: 85 mg/dL (ref 70–99)
Potassium: 4.1 meq/L (ref 3.5–5.1)
Sodium: 138 meq/L (ref 135–145)
Total Bilirubin: 0.3 mg/dL (ref 0.2–1.2)
Total Protein: 8 g/dL (ref 6.0–8.3)

## 2023-09-02 LAB — SEDIMENTATION RATE: Sed Rate: 45 mm/h — ABNORMAL HIGH (ref 0–30)

## 2023-09-02 LAB — IBC + FERRITIN
Ferritin: 9.1 ng/mL — ABNORMAL LOW (ref 10.0–291.0)
Iron: 72 ug/dL (ref 42–145)
Saturation Ratios: 14.7 % — ABNORMAL LOW (ref 20.0–50.0)
TIBC: 490 ug/dL — ABNORMAL HIGH (ref 250.0–450.0)
Transferrin: 350 mg/dL (ref 212.0–360.0)

## 2023-09-02 LAB — LIPASE: Lipase: 12 U/L (ref 11.0–59.0)

## 2023-09-02 LAB — C-REACTIVE PROTEIN: CRP: <1 mg/dL (ref 0.5–20.0)

## 2023-09-02 MED ORDER — PANTOPRAZOLE SODIUM 40 MG PO TBEC: 40.0000 mg | DELAYED_RELEASE_TABLET | Freq: Two times a day (BID) | ORAL | 2 refills | Status: DC

## 2023-09-02 MED ORDER — CIPROFLOXACIN HCL 500 MG PO TABS: 500.0000 mg | ORAL_TABLET | Freq: Two times a day (BID) | ORAL | 0 refills | Status: DC

## 2023-09-02 MED ORDER — METRONIDAZOLE 500 MG PO TABS: 500.0000 mg | ORAL_TABLET | Freq: Three times a day (TID) | ORAL | 0 refills | Status: DC

## 2023-09-02 NOTE — Patient Instructions (Addendum)
 No NSAIDs Increase Pantoprazole  40 mg to twice daily Antiemetics prn nausea Treat with 10 days of Ciprofloxacin  and Flagyl . No alcohol use with antibiotics. Follow full liquid diet   Your provider has requested that you go to the basement level for lab work before leaving today. Press B on the elevator. The lab is located at the first door on the left as you exit the elevator.  _______________________________________________________  If your blood pressure at your visit was 140/90 or greater, please contact your primary care physician to follow up on this.  _______________________________________________________  If you are age 35 or older, your body mass index should be between 23-30. Your Body mass index is 31.24 kg/m. If this is out of the aforementioned range listed, please consider follow up with your Primary Care Provider.  If you are age 84 or younger, your body mass index should be between 19-25. Your Body mass index is 31.24 kg/m. If this is out of the aformentioned range listed, please consider follow up with your Primary Care Provider.   ________________________________________________________  The [facility] providers would like to encourage you to use MYCHART to communicate with providers for non-urgent requests or questions.  Due to long hold times on the telephone, sending your provider a message by [facility] may be a faster and more efficient way to get a response.  Please allow 48 business hours for a response.  Please remember that this is for non-urgent requests.  _______________________________________________________   Thank you for trusting me with your gastrointestinal care. Deanna May, RNP

## 2023-09-02 NOTE — Progress Notes (Addendum)
 Chief Complaint:ED follow-up, diverticulitis Primary GI Doctor:Dr. Venice Gillis  HPI: 58 year old female history of thrombocytosis, PUD, CKD, diverticulitis and documented history of cocaine use. presents for follow-up after ED visit for diverticulitis. Last seen in GI [facility] by [facility] on 03/16/23 for nausea, vomiting, and abdominal pain.   08/30/23 ED visit for abd pain. Recently diagnosed with diverticulitis in [place].  Elevated white count of 15.8.  Metabolic profile is normal. CT scan shows colonic diverticulosis with no evidence of acute diverticulitis.  Interval History    Patient presents for follow-up after recent ED visit. Patient was on a trip in [place]  and went to ED on June 9th for abd pain, nausea, and vomiting.  CT scan showed diverticulitis. She was treated with Augmentin  for 7 days. She reports she felt better initially but then the symptoms returned resulting in her showing back up in the ED on 6/15 where they ordered follow-up CT scan that showed diverticulosis with no diverticulitis. Patient continues with LLQ abdominal pain. She reports constant abdominal pain , she states she has taken a full bottle of Tylenol  over the past two weeks with not much relief. She states the percocet prescribed in ED also did not help much. No NSAID's. Confirmed patient is taking her daily Pantoprazole . She also has constant nausea, she reports she has not had anything solid since Sunday. She is taking ondansetron  about every 4-6 hours for nausea. She reports no appetite.She reports feeling cold today with chills. Patient reports she has bowel movement daily , she reports this morning she had a dark hard stool. She reports the dark stool started in early June. No Pepto bismul use. She was given samples of Linzess at last appointment for constipation, but reports she could not take due to it causing nausea.      Wt Readings from Last 3 Encounters:  09/02/23 182 lb (82.6 kg)  08/30/23 176 lb (79.8 kg)   08/06/23 175 lb 12.8 oz (79.7 kg)     Past Medical History:  Diagnosis Date   Allergy    Anemia    Diverticulosis    Gall bladder stones 1993   Gastric ulcer    GERD (gastroesophageal reflux disease)    Hypertension    Renal mass     Past Surgical History:  Procedure Laterality Date   ABDOMINAL HYSTERECTOMY     BIOPSY  09/29/2022   Procedure: BIOPSY;  Surgeon: Sergio Dandy, MD;  Location: [facility] END[facility];  Service: Gastroenterology;;   CHOLECYSTECTOMY     COLONOSCOPY WITH ESOPHAGOGASTRODUODENOSCOPY (EGD)  04/07/2023   [facility]   ESOPHAGOGASTRODUODENOSCOPY (EGD) WITH PROPOFOL  N/A 09/29/2022   Procedure: ESOPHAGOGASTRODUODENOSCOPY (EGD) WITH PROPOFOL ;  Surgeon: Sergio Dandy, MD;  Location: [facility] ENDOSCOPY;  Service: Gastroenterology;  Laterality: N/A;    Current Outpatient Medications  Medication Sig Dispense Refill   acetaminophen  (TYLENOL ) 500 MG tablet Take 1 tablet (500 mg total) by mouth every 6 (six) hours as needed. 30 tablet 0   amLODipine  (NORVASC ) 10 MG tablet Take 1 tablet (10 mg total) by mouth daily. 30 tablet 0   ciprofloxacin  (CIPRO ) 500 MG tablet Take 1 tablet (500 mg total) by mouth 2 (two) times daily. 20 tablet 0   FLUoxetine  (PROZAC ) 20 MG tablet Take 20 mg by mouth daily.     gabapentin  (NEURONTIN ) 300 MG capsule Take 1 capsule (300 mg total) by mouth 2 (two) times daily. (Patient taking differently: Take 300 mg by mouth 2 (two) times daily as needed (for  shoulder pain).) 60 capsule 1   hydrochlorothiazide  (HYDRODIURIL ) 25 MG tablet Take 1 tablet (25 mg total) by mouth daily. 30 tablet 0   hydrOXYzine  (ATARAX ) 50 MG tablet Take 50 mg by mouth 2 (two) times daily as needed for anxiety.     metroNIDAZOLE  (FLAGYL ) 500 MG tablet Take 1 tablet (500 mg total) by mouth 3 (three) times daily. 30 tablet 0   mirtazapine  (REMERON ) 15 MG tablet Take 15 mg by mouth at bedtime.     naphazoline-glycerin  (CLEAR EYES REDNESS) 0.012-0.25 % SOLN Place 1-2 drops  into both eyes 4 (four) times daily as needed for eye irritation. 1 mL 0   nicotine  (NICODERM CQ  - DOSED IN MG/24 HOURS) 21 mg/24hr patch Place 1 patch (21 mg total) onto the skin daily. 28 patch 0   ondansetron  (ZOFRAN ) 4 MG tablet Take 1 tablet (4 mg total) by mouth every 8 (eight) hours as needed for nausea or vomiting. 20 tablet 0   oxyCODONE -acetaminophen  (PERCOCET/ROXICET) 5-325 MG tablet Take 1 tablet by mouth 3 (three) times daily as needed (for pain).     oxyCODONE -acetaminophen  (PERCOCET/ROXICET) 5-325 MG tablet Take 1 tablet by mouth every 8 (eight) hours as needed for severe pain (pain score 7-10). 6 tablet 0   pregabalin  (LYRICA ) 75 MG capsule Take 75 mg by mouth 2 (two) times daily.     promethazine  (PHENERGAN ) 12.5 MG tablet Take 1 tablet (12.5 mg total) by mouth every 6 (six) hours as needed for nausea or vomiting. 10 tablet 0   risperiDONE  (RISPERDAL ) 1 MG tablet Take 1 mg by mouth 2 (two) times daily.     traZODone  (DESYREL ) 100 MG tablet Take 100 mg by mouth at bedtime.     traZODone  (DESYREL ) 50 MG tablet Take 1 tablet (50 mg total) by mouth at bedtime. 30 tablet 0   TYLENOL  500 MG tablet Take 500-1,000 mg by mouth every 6 (six) hours as needed for mild pain (pain score 1-3) or headache.     albuterol  (PROVENTIL ) (2.5 MG/3ML) 0.083% nebulizer solution Take 3 mLs (2.5 mg total) by nebulization every 2 (two) hours as needed for wheezing. (Patient not taking: Reported on 09/02/2023) 75 mL 12   dicyclomine  (BENTYL ) 20 MG tablet Take 1 tablet (20 mg total) by mouth 3 (three) times daily before meals. (Patient not taking: Reported on 09/02/2023) 90 tablet 0   FLUoxetine  (PROZAC ) 20 MG capsule Take 1 capsule (20 mg total) by mouth daily. (Patient not taking: Reported on 09/02/2023) 30 capsule 3   hydrochlorothiazide  (HYDRODIURIL ) 25 MG tablet Take 1 tablet (25 mg total) by mouth daily for 7 days. (Patient not taking: Reported on 08/30/2023) 7 tablet 0   mirtazapine  (REMERON ) 30 MG tablet Take  1 tablet (30 mg total) by mouth at bedtime. (Patient not taking: Reported on 08/30/2023) 30 tablet 0   pantoprazole  (PROTONIX ) 40 MG tablet Take 1 tablet (40 mg total) by mouth 2 (two) times daily. 30 tablet 2   No current facility-administered medications for this visit.    Allergies as of 09/02/2023 - Review Complete 09/02/2023  Allergen Reaction Noted   Hydroxyzine  Palpitations and Other (See Comments) 06/07/2018   Nsaids Other (See Comments) 08/30/2023   Ambien  [zolpidem  tartrate] Other (See Comments) 06/07/2018   Ibuprofen  Other (See Comments) 02/16/2023    Family History  Problem Relation Age of Onset   Cancer Maternal Uncle        Lung   Cancer Maternal Uncle  Lung   Cancer Maternal Uncle        Lung   Headache Neg Hx        I don't think so   Migraines Neg Hx        I don't think so    Review of Systems:    Constitutional: No weight loss, fever, chills, weakness or fatigue HEENT: Eyes: No change in vision               Ears, Nose, Throat:  No change in hearing or congestion Skin: No rash or itching Cardiovascular: No chest pain, chest pressure or palpitations   Respiratory: No SOB or cough Gastrointestinal: See HPI and otherwise negative Genitourinary: No dysuria or change in urinary frequency Neurological: No headache, dizziness or syncope Musculoskeletal: No new muscle or joint pain Hematologic: No bleeding or bruising Psychiatric: No history of depression or anxiety    Physical Exam:  Vital signs: BP 130/82   Pulse 78   Ht 5' 4 (1.626 m)   Wt 182 lb (82.6 kg)   SpO2 97%   BMI 31.24 kg/m   Constitutional:   Pleasant  female, alert and cooperative, bent over in chair Throat: Oral cavity and pharynx without inflammation, swelling or lesion.  Respiratory: Respirations even and unlabored. Lungs clear to auscultation bilaterally.   No wheezes, crackles, or rhonchi.  Cardiovascular: Normal S1, S2. Regular rate and rhythm. No peripheral edema,  cyanosis or pallor.  Gastrointestinal:  Soft, nondistended, LLQ abd tenderness with palpation. No rebound or guarding. Normal bowel sounds. No appreciable masses or hepatomegaly. Rectal: Normal external rectal exam, normal rectal tone, appreciated internal hemorrhoids, tender, no masses, , brown stool, hemoccult Negative. Chaperone MA Denise. Msk:  Symmetrical without gross deformities. Without edema, no deformity or joint abnormality.  Neurologic:  Alert and  oriented x4;  grossly normal neurologically.  Skin:   Dry and intact without significant lesions or rashes. Psychiatric: Oriented to person, place and time. Demonstrates good judgement and reason without abnormal affect or behaviors.  RELEVANT LABS AND IMAGING: CBC    Latest Ref Rng & Units 09/02/2023    2:49 PM 08/30/2023    6:12 PM 08/03/2023   11:47 AM  CBC  WBC 4.0 - 10.5 K/uL 12.1  15.8  13.2   Hemoglobin 12.0 - 15.0 g/dL 16.1  09.6  04.5   Hematocrit 36.0 - 46.0 % 38.4  33.6  37.3   Platelets 150.0 - 400.0 K/uL 564.0  479  491      CMP     Latest Ref Rng & Units 09/02/2023    2:49 PM 08/30/2023    6:12 PM 08/03/2023   11:47 AM  CMP  Glucose 70 - 99 mg/dL 85  409  95   BUN 6 - 23 mg/dL 13  22  9    Creatinine 0.40 - 1.20 mg/dL 8.11  9.14  7.82   Sodium 135 - 145 mEq/L 138  141  137   Potassium 3.5 - 5.1 mEq/L 4.1  4.0  3.5   Chloride 96 - 112 mEq/L 103  111  107   CO2 19 - 32 mEq/L 31  23  20    Calcium  8.4 - 10.5 mg/dL 9.4  8.6  9.2   Total Protein 6.0 - 8.3 g/dL 8.0  6.6  7.5   Total Bilirubin 0.2 - 1.2 mg/dL 0.3  0.6  0.6   Alkaline Phos 39 - 117 U/L 71  61  72   AST 0 -  37 U/L 13  15  14    ALT 0 - 35 U/L 13  13  17       Lab Results  Component Value Date   TSH 0.405 05/22/2023   08/30/23 CT ABD/pelvis IMPRESSION: 1. Distal colonic diverticulosis with no evidence of acute diverticulitis. 2.  Aortic Atherosclerosis (ICD10-I70.0). 07/28/23 CT abd/pelvis MPRESSION: 1. Colonic diverticulosis with mild thickening of  the mid sigmoid colon which Rehman Levinson represent sequelae associated with mild diverticulitis. 2. Stable left adrenal adenoma. 3. Multiple stable bilateral simple renal cysts. No follow-up imaging is recommended. 03/04/23 CT abd/pelvis IMPRESSION: *No acute inflammatory process identified within the abdomen or pelvis. *Multiple other nonacute observations, as described above.  01/21/2023 nuclear medicine gastric emptying study FINDINGS: Expected location of the stomach in the left upper quadrant. Ingested meal empties the stomach gradually over the course of the study with 31% retention at 60 min and 5% retention at 120 min (normal retention less than 30% at a 120 min). IMPRESSION: Normal gastric emptying study.   01/18/2023 CT abdomen pelvis with contrast IMPRESSION: 1. No acute finding in the abdomen or pelvis. 2. Colonic diverticulosis without evidence of acute diverticulitis.   10/21/2022 CT abdomen pelvis with contrast IMPRESSION: No bowel obstruction, free air or free fluid. Normal appendix. Colonic diverticula. Previous inflammatory changes along the distal descending colon are not clearly seen on the current examination. Previous cholecystectomy. Fatty liver infiltration.   10/20/2022 CT abdomen/pelvis with contrast IMPRESSION: 1. Improved appearance of diverticulitis at the junction of the descending and sigmoid colon, nearly resolved. No extraluminal gas or abscess. 2. Aortic atherosclerosis.   04/07/23 EGD with Dr. Willy Harvest Impression: - Enlarged gastric folds. Biopsied. Likely same thing described as a papule in 2024 - which was inflamed mucosa from prolapse - Scar in the gastric antrum. Biopsied. - The examination was otherwise normal. 04/07/23 colonoscopy with Dr. Willy Harvest Impression: - Diverticulosis in the sigmoid colon. - The examination was otherwise normal on direct and retroflexion views. - No specimens collected.   09/29/22 EGD - Z-line regular, 38 cm from the incisors.  - No gross lesions in the entire esophagus. - Non-bleeding gastric ulcer with no stigmata of bleeding. - A single mucosal papule (nodule) found in the stomach. Biopsied. - Gastritis. Biopsied. - Normal examined duodenum.   A. STOMACH, ANTRUM NODULE, BIOPSY:  - Polypoid gastric antral mucosa showing marked reactive gastropathy and  features of mucosal prolapse  - Negative for intestinal metaplasia, dysplasia or malignancy   B. STOMACH, RANDOM, BIOPSY:  - Gastric antral and oxyntic mucosa with nonspecific reactive  gastropathy  - Helicobacter pylori-like organisms are not identified on routine HE  stain    Reported colonoscopy in 2018 with [facility]   Assessment: Encounter Diagnoses  Name Primary?   LLQ abdominal pain Yes   Diverticulitis of colon    Nausea and vomiting, unspecified vomiting type    Poor appetite    Gastroesophageal reflux disease without esophagitis    Melena    History of stomach ulcers    History of diverticulitis      58 year old female patient who presents for follow-up after recent and episode of reoccurring diverticulitis.  Patient continues with persistent symptoms of LLQ abdominal pain, nausea and vomiting, and poor appetite. I reviewed the 9 CT scans patient has had since 09/2022. Her initial episode of diverticulitis was in August 2024 then she had second episode in [place] 2025, followed by what I suspect was unresolved episode of diverticulitis on 6/9.  Patient was treated with Augmentin  however continued with abdominal pain therefore went back to ER where she had elevated WBC of 15.8 however CT scan was negative. With patients presenting symptoms today concerns for unresolved inflammation/diverticulitis I will go ahead and treat her with 10 days of Cipro  and Flagyl . She has been treated with Augmentin  on last two ED visits.  I will also refer to [facility] for consultation.  Recommended full liquid diet and patient can use antiemetics as needed.  Recommended  she be on some type of laxative for constipation however she declines and states she is unable to tolerate oral or suppository form of medications.  if her symptoms worsen or she cannot keep down medication or food advise she go to the ER for IV hydration.     Patient also reports melena for past month, Hx of stomach ulcer 09/2022, No history of NSAID use. Hgb has dropped 12.1>10.6. negative fecal occult today.  Patient had follow-up endoscopy in January that showed resolution of symptoms.  She is currently on pantoprazole  once daily will increase to twice daily.  Will recheck her levels today.   Plan: -Check CBC, CMP,lipase today  -No NSAIDs -Increase Pantoprazole  40 mg to twice daily -Follow full liquid diet -Cont Antiemetics prn -treat with 10 days of Ciprofloxacin  and Flagyl . No alcohol use with antibiotics -referral to [facility] for reoccurring diverticulitis  Thank you for the courtesy of this consult. Please call me with any questions or concerns.   Caressa Scearce, FNP-C [facility] 09/02/2023, 4:15 PM  Cc: Abraham Abo, MD

## 2023-09-04 ENCOUNTER — Ambulatory Visit: Payer: Self-pay | Admitting: Gastroenterology

## 2023-09-04 DIAGNOSIS — G8929 Other chronic pain: Secondary | ICD-10-CM

## 2023-09-07 NOTE — Addendum Note (Signed)
 Addended by: RODGERS LACY on: 09/07/2023 03:17 PM   Modules accepted: Orders

## 2023-09-09 ENCOUNTER — Other Ambulatory Visit: Payer: Self-pay

## 2023-09-09 ENCOUNTER — Emergency Department (HOSPITAL_COMMUNITY)

## 2023-09-09 ENCOUNTER — Emergency Department (HOSPITAL_COMMUNITY): Admission: EM | Admit: 2023-09-09 | Discharge: 2023-09-09 | Disposition: A | Discharge status: Home / Self Care

## 2023-09-09 ENCOUNTER — Encounter (HOSPITAL_COMMUNITY): Payer: Self-pay

## 2023-09-09 DIAGNOSIS — D72829 Elevated white blood cell count, unspecified: Secondary | ICD-10-CM | POA: Diagnosis not present

## 2023-09-09 DIAGNOSIS — R1032 Left lower quadrant pain: Secondary | ICD-10-CM | POA: Insufficient documentation

## 2023-09-09 DIAGNOSIS — R197 Diarrhea, unspecified: Secondary | ICD-10-CM | POA: Diagnosis not present

## 2023-09-09 DIAGNOSIS — N189 Chronic kidney disease, unspecified: Secondary | ICD-10-CM | POA: Diagnosis not present

## 2023-09-09 DIAGNOSIS — R110 Nausea: Secondary | ICD-10-CM | POA: Insufficient documentation

## 2023-09-09 DIAGNOSIS — R109 Unspecified abdominal pain: Secondary | ICD-10-CM | POA: Diagnosis present

## 2023-09-09 LAB — CBC WITH DIFFERENTIAL/PLATELET
Abs Immature Granulocytes: 0.02 10*3/uL (ref 0.00–0.07)
Basophils Absolute: 0.1 10*3/uL (ref 0.0–0.1)
Basophils Relative: 0 %
Eosinophils Absolute: 0.2 10*3/uL (ref 0.0–0.5)
Eosinophils Relative: 2 %
HCT: 38.6 % (ref 36.0–46.0)
Hemoglobin: 12.5 g/dL (ref 12.0–15.0)
Immature Granulocytes: 0 %
Lymphocytes Relative: 29 %
Lymphs Abs: 3.6 10*3/uL (ref 0.7–4.0)
MCH: 26.8 pg (ref 26.0–34.0)
MCHC: 32.4 g/dL (ref 30.0–36.0)
MCV: 82.8 fL (ref 80.0–100.0)
Monocytes Absolute: 0.8 10*3/uL (ref 0.1–1.0)
Monocytes Relative: 7 %
Neutro Abs: 7.7 10*3/uL (ref 1.7–7.7)
Neutrophils Relative %: 62 %
Platelets: 675 10*3/uL — ABNORMAL HIGH (ref 150–400)
RBC: 4.66 MIL/uL (ref 3.87–5.11)
RDW: 18.1 % — ABNORMAL HIGH (ref 11.5–15.5)
WBC: 12.5 10*3/uL — ABNORMAL HIGH (ref 4.0–10.5)
nRBC: 0 % (ref 0.0–0.2)

## 2023-09-09 LAB — URINALYSIS, ROUTINE W REFLEX MICROSCOPIC
Bilirubin Urine: NEGATIVE
Glucose, UA: NEGATIVE mg/dL
Hgb urine dipstick: NEGATIVE
Ketones, ur: NEGATIVE mg/dL
Leukocytes,Ua: NEGATIVE
Nitrite: NEGATIVE
Protein, ur: NEGATIVE mg/dL
Specific Gravity, Urine: 1.019 (ref 1.005–1.030)
pH: 5 (ref 5.0–8.0)

## 2023-09-09 LAB — COMPREHENSIVE METABOLIC PANEL WITH GFR
ALT: 15 U/L (ref 0–44)
AST: 17 U/L (ref 15–41)
Albumin: 3.7 g/dL (ref 3.5–5.0)
Alkaline Phosphatase: 60 U/L (ref 38–126)
Anion gap: 7 (ref 5–15)
BUN: 12 mg/dL (ref 6–20)
CO2: 24 mmol/L (ref 22–32)
Calcium: 9.3 mg/dL (ref 8.9–10.3)
Chloride: 108 mmol/L (ref 98–111)
Creatinine, Ser: 1.23 mg/dL — ABNORMAL HIGH (ref 0.44–1.00)
GFR, Estimated: 51 mL/min — ABNORMAL LOW (ref >60)
Glucose, Bld: 92 mg/dL (ref 70–99)
Potassium: 4.2 mmol/L (ref 3.5–5.1)
Sodium: 139 mmol/L (ref 135–145)
Total Bilirubin: 0.4 mg/dL (ref 0.0–1.2)
Total Protein: 7.3 g/dL (ref 6.5–8.1)

## 2023-09-09 LAB — LIPASE, BLOOD: Lipase: 30 U/L (ref 11–51)

## 2023-09-09 MED ORDER — ONDANSETRON HCL 4 MG/2ML IJ SOLN
4.0000 mg | Freq: Once | INTRAMUSCULAR | Status: AC
  Administered 2023-09-09: 4 mg via INTRAVENOUS
  Filled 2023-09-09: qty 2

## 2023-09-09 MED ORDER — MORPHINE SULFATE (PF) 4 MG/ML IV SOLN
4.0000 mg | Freq: Once | INTRAVENOUS | Status: AC
  Administered 2023-09-09: 4 mg via INTRAVENOUS
  Filled 2023-09-09: qty 1

## 2023-09-09 MED ORDER — DICYCLOMINE HCL 20 MG PO TABS: 20.0000 mg | ORAL_TABLET | Freq: Three times a day (TID) | ORAL | 0 refills | Status: DC

## 2023-09-09 MED ORDER — IOHEXOL 350 MG/ML SOLN
75.0000 mL | Freq: Once | INTRAVENOUS | Status: AC | PRN
  Administered 2023-09-09: 75 mL via INTRAVENOUS

## 2023-09-09 NOTE — ED Notes (Signed)
 Patient discharged by RN in [employer] home. Voucher provided by RN and called [facility] for transport home. Patient verbalizes understanding of instructions without additional questions.

## 2023-09-09 NOTE — ED Triage Notes (Signed)
 Patient arrives via [facility] for abdominal pain and nausea, recent dx of diverticulitis. LUQ pain worse upon palpation, pain since Sunday. Able to void, dry heaving, sees GI surg tomorrow for eval. Alert and oriented x4.   EMS vitals  130/70 HR 82 O2 95 on room air

## 2023-09-09 NOTE — ED Notes (Signed)
 IV team at bedside

## 2023-09-09 NOTE — Discharge Instructions (Addendum)
 You have been evaluated for your symptoms.  Fortunately CT scan today did not show any concerning finding.  Please follow-up closely with your general surgeon tomorrow as scheduled.  Follow-up with your primary care doctor for further care.  Return if you have any concern.

## 2023-09-09 NOTE — ED Provider Notes (Signed)
 [place] EMERGENCY DEPARTMENT AT [facility] Provider Note   CSN: [id number] Arrival date & time: 09/09/23  9184     Patient presents with: No chief complaint on file.   Natalie Edwards is a 58 y.o. female.   The history is provided by the patient and medical records. No language interpreter was used.     58 year old female with history of polysubstance use, chronic gastric ulcer, CKD, GERD prior surgical history including cholecystectomy and abdominal hysterectomy brought here via EMS for evaluation of abdominal pain.  Patient report for the past 3 days she has had pain to the left side of her abdomen.  Pain is described as a sharp crampy sensation with associated nausea and some loose stools and symptoms felt similar to diverticulitis that she has had in the past.  Her last diverticulitis flare was 3 weeks ago when she was in [place] .  States she had a CT scan done and was prescribed antibiotic for which she took for the full duration with some improvement.  She is scheduled to be seen by a surgeon tomorrow.  She does not endorse any fever or chills no chest pain or shortness of breath no productive cough or urinary symptoms and she has not noticed any blood in stool.  Patient admits to tobacco use but denies alcohol use.  Prior to Admission medications   Medication Sig Start Date End Date Taking? Authorizing Provider  acetaminophen  (TYLENOL ) 500 MG tablet Take 1 tablet (500 mg total) by mouth every 6 (six) hours as needed. 08/30/23   Charlyn Sora, MD  albuterol  (PROVENTIL ) (2.5 MG/3ML) 0.083% nebulizer solution Take 3 mLs (2.5 mg total) by nebulization every 2 (two) hours as needed for wheezing. Patient not taking: Reported on 09/02/2023 06/11/23   Jadapalle, Sree, MD  amLODipine  (NORVASC ) 10 MG tablet Take 1 tablet (10 mg total) by mouth daily. 06/11/23   Jadapalle, Sree, MD  ciprofloxacin  (CIPRO ) 500 MG tablet Take 1 tablet (500 mg total) by mouth 2 (two) times daily. 09/02/23    May, Deanna J, NP  dicyclomine  (BENTYL ) 20 MG tablet Take 1 tablet (20 mg total) by mouth 3 (three) times daily before meals. Patient not taking: Reported on 09/02/2023 06/11/23   Jadapalle, Sree, MD  FLUoxetine  (PROZAC ) 20 MG capsule Take 1 capsule (20 mg total) by mouth daily. Patient not taking: Reported on 09/02/2023 06/11/23   Jadapalle, Sree, MD  FLUoxetine  (PROZAC ) 20 MG tablet Take 20 mg by mouth daily.    [provider]  gabapentin  (NEURONTIN ) 300 MG capsule Take 1 capsule (300 mg total) by mouth 2 (two) times daily. Patient taking differently: Take 300 mg by mouth 2 (two) times daily as needed (for shoulder pain). 08/06/23   Tanda Bleacher, MD  hydrochlorothiazide  (HYDRODIURIL ) 25 MG tablet Take 1 tablet (25 mg total) by mouth daily. 06/12/23   Jadapalle, Sree, MD  hydrochlorothiazide  (HYDRODIURIL ) 25 MG tablet Take 1 tablet (25 mg total) by mouth daily for 7 days. Patient not taking: Reported on 08/30/2023 07/21/23 08/30/23  Cottie Donnice PARAS, MD  hydrOXYzine  (ATARAX ) 50 MG tablet Take 50 mg by mouth 2 (two) times daily as needed for anxiety.    [provider]  metroNIDAZOLE  (FLAGYL ) 500 MG tablet Take 1 tablet (500 mg total) by mouth 3 (three) times daily. 09/02/23   May, Deanna J, NP  mirtazapine  (REMERON ) 15 MG tablet Take 15 mg by mouth at bedtime.    [provider]  mirtazapine  (REMERON ) 30 MG tablet  Take 1 tablet (30 mg total) by mouth at bedtime. Patient not taking: Reported on 08/30/2023 06/11/23   Jadapalle, Sree, MD  naphazoline-glycerin  (CLEAR EYES REDNESS) 0.012-0.25 % SOLN Place 1-2 drops into both eyes 4 (four) times daily as needed for eye irritation. 06/11/23   Jadapalle, Sree, MD  nicotine  (NICODERM CQ  - DOSED IN MG/24 HOURS) 21 mg/24hr patch Place 1 patch (21 mg total) onto the skin daily. 06/12/23   Donnelly Mellow, MD  ondansetron  (ZOFRAN ) 4 MG tablet Take 1 tablet (4 mg total) by mouth every 8 (eight) hours as needed for nausea or vomiting. 08/06/23    Tanda Bleacher, MD  oxyCODONE -acetaminophen  (PERCOCET/ROXICET) 5-325 MG tablet Take 1 tablet by mouth 3 (three) times daily as needed (for pain).    [provider]  oxyCODONE -acetaminophen  (PERCOCET/ROXICET) 5-325 MG tablet Take 1 tablet by mouth every 8 (eight) hours as needed for severe pain (pain score 7-10). 08/30/23   Charlyn Sora, MD  pantoprazole  (PROTONIX ) 40 MG tablet Take 1 tablet (40 mg total) by mouth 2 (two) times daily. 09/02/23   May, Deanna J, NP  pregabalin  (LYRICA ) 75 MG capsule Take 75 mg by mouth 2 (two) times daily.    [provider]  promethazine  (PHENERGAN ) 12.5 MG tablet Take 1 tablet (12.5 mg total) by mouth every 6 (six) hours as needed for nausea or vomiting. 07/28/23   Bernard Drivers, MD  risperiDONE  (RISPERDAL ) 1 MG tablet Take 1 mg by mouth 2 (two) times daily.    [provider]  traZODone  (DESYREL ) 100 MG tablet Take 100 mg by mouth at bedtime.    [provider]  traZODone  (DESYREL ) 50 MG tablet Take 1 tablet (50 mg total) by mouth at bedtime. 06/11/23   Donnelly Mellow, MD  TYLENOL  500 MG tablet Take 500-1,000 mg by mouth every 6 (six) hours as needed for mild pain (pain score 1-3) or headache.    [provider]    Allergies: Hydroxyzine , Nsaids, Ambien  [zolpidem  tartrate], and Ibuprofen     Review of Systems  All other systems reviewed and are negative.   Updated Vital Signs BP (!) 145/77   Pulse 75   Temp 98.1 F (36.7 C) (Oral)   Resp (!) 23   Ht 5' 4 (1.626 m)   Wt 82.6 kg   SpO2 96%   BMI 31.24 kg/m   Physical Exam Vitals and nursing note reviewed.  Constitutional:      General: She is not in acute distress.    Appearance: She is well-developed.  HENT:     Head: Atraumatic.   Eyes:     Conjunctiva/sclera: Conjunctivae normal.    Cardiovascular:     Rate and Rhythm: Normal rate and regular rhythm.     Pulses: Normal pulses.     Heart sounds: Normal heart sounds.  Pulmonary:     Effort:  Pulmonary effort is normal.  Abdominal:     Palpations: Abdomen is soft.     Tenderness: There is abdominal tenderness. There is no guarding or rebound.   Musculoskeletal:     Cervical back: Neck supple.   Skin:    Findings: No rash.   Neurological:     Mental Status: She is alert.   Psychiatric:        Mood and Affect: Mood normal.     (all labs ordered are listed, but only abnormal results are displayed) Labs Reviewed  CBC WITH DIFFERENTIAL/PLATELET - Abnormal; Notable for the following components:  Result Value   WBC 12.5 (*)    RDW 18.1 (*)    Platelets 675 (*)    All other components within normal limits  COMPREHENSIVE METABOLIC PANEL WITH GFR - Abnormal; Notable for the following components:   Creatinine, Ser 1.23 (*)    GFR, Estimated 51 (*)    All other components within normal limits  URINALYSIS, ROUTINE W REFLEX MICROSCOPIC - Abnormal; Notable for the following components:   APPearance HAZY (*)    All other components within normal limits  LIPASE, BLOOD    EKG: None  Radiology: CT ABDOMEN PELVIS W CONTRAST Result Date: 09/09/2023 CLINICAL DATA:  Left-sided abdominal pain and tenderness for several days. Diverticulosis. EXAM: CT ABDOMEN AND PELVIS WITH CONTRAST TECHNIQUE: Multidetector CT imaging of the abdomen and pelvis was performed using the standard protocol following bolus administration of intravenous contrast. RADIATION DOSE REDUCTION: This exam was performed according to the departmental dose-optimization program which includes automated exposure control, adjustment of the mA and/or kV according to patient size and/or use of iterative reconstruction technique. CONTRAST:  75mL OMNIPAQUE  IOHEXOL  350 MG/ML SOLN COMPARISON:  08/30/2023 FINDINGS: Lower Chest: No acute findings. Hepatobiliary: No suspicious hepatic masses identified. Prior cholecystectomy again noted. Mild chronic biliary ductal dilatation shows no significant change. Pancreas:  No mass or  inflammatory changes. Spleen: Within normal limits in size and appearance. Adrenals/Urinary Tract: Stable 1.7 cm left adrenal mass, consistent with benign adenoma. No suspicious renal masses identified. Benign-appearing cysts again seen in both kidneys (No followup imaging is recommended). No evidence of ureteral calculi or hydronephrosis. Unremarkable unopacified urinary bladder. Stomach/Bowel: No evidence of obstruction, inflammatory process or abnormal fluid collections. Diverticulosis is seen mainly involving the descending and sigmoid colon, however there is no evidence of diverticulitis. A tiny midline epigastric ventral hernia is again seen which contains only fat. Vascular/Lymphatic: No pathologically enlarged lymph nodes. No acute vascular findings. Reproductive: Prior hysterectomy noted. Adnexal regions are unremarkable in appearance. Other:  None. Musculoskeletal:  No suspicious bone lesions identified. IMPRESSION: Colonic diverticulosis, without radiographic evidence of diverticulitis or other acute findings. Stable small benign left adrenal adenoma (no followup imaging is recommended). Stable tiny epigastric ventral hernia, which contains only fat. Electronically Signed   By: Norleen DELENA Kil M.D.   On: 09/09/2023 12:25     Procedures   Medications Ordered in the ED  morphine  (PF) 4 MG/ML injection 4 mg (4 mg Intravenous Given 09/09/23 1006)  ondansetron  (ZOFRAN ) injection 4 mg (4 mg Intravenous Given 09/09/23 1006)  iohexol  (OMNIPAQUE ) 350 MG/ML injection 75 mL (75 mLs Intravenous Contrast Given 09/09/23 1141)  morphine  (PF) 4 MG/ML injection 4 mg (4 mg Intravenous Given 09/09/23 1236)  ondansetron  (ZOFRAN ) injection 4 mg (4 mg Intravenous Given 09/09/23 1236)                                    Medical Decision Making Amount and/or Complexity of Data Reviewed Labs: ordered. Radiology: ordered.  Risk Prescription drug management.   BP 134/86 (BP Location: Right Arm)   Pulse 79   Temp  97.6 F (36.4 C) (Oral)   Resp 15   Ht 5' 4 (1.626 m)   Wt 82.6 kg   SpO2 100%   BMI 31.24 kg/m   59:42 AM  58 year old female with history of polysubstance use, chronic gastric ulcer, CKD, GERD prior surgical history including cholecystectomy and abdominal hysterectomy brought here via EMS for evaluation  of abdominal pain.  Patient report for the past 3 days she has had pain to the left side of her abdomen.  Pain is described as a sharp crampy sensation with associated nausea and some loose stools and symptoms felt similar to diverticulitis that she has had in the past.  Her last diverticulitis flare was 3 weeks ago when she was in [place] .  States she had a CT scan done and was prescribed antibiotic for which she took for the full duration with some improvement.  She is scheduled to be seen by a surgeon tomorrow.  She does not endorse any fever or chills no chest pain or shortness of breath no productive cough or urinary symptoms and she has not noticed any blood in stool.  Patient admits to tobacco use but denies alcohol use.  On patient laying in bed appears slightly uncomfortable but nontoxic.  Heart with normal rate and rhythm, lungs clear, abdomen is soft with diffuse tenderness more significant to epigastric and left lower quadrant without guarding or rebound tenderness.  Given history of recurrent diverticulitis, will obtain CT scan of the abdomen pelvis for further assessment.  Pain medication given, antinausea medication provided.\  -Labs ordered, independently viewed and interpreted by me.  Labs remarkable for mild elevated white count of 12.5.  Urinalysis without signs of UTI, electrolyte panels are mostly reassuring, creatinine of 1.23, IV fluid given, normal lipase -The patient was maintained on a cardiac monitor.  I personally viewed and interpreted the cardiac monitored which showed an underlying rhythm of: Sinus rhythm -Imaging independently viewed and interpreted by me and I  agree with radiologist's interpretation.  Result remarkable for abdominal pelvis CT scan demonstrate evidence of colonic diverticulosis without evidence of diverticulitis and no other acute finding were noted. -This patient presents to the ED for concern of abdominal pain, this involves an extensive number of treatment options, and is a complaint that carries with it a high risk of complications and morbidity.  The differential diagnosis includes colitis, diverticulitis, UTI, kidney stone, pyelonephritis, pancreatitis, appendicitis, cholecystitis, shingles, -Co morbidities that complicate the patient evaluation includes history of recurrent cholecystitis, polysubstance use, chronic gastric ulcer, CKD, GERD -Treatment includes IV fluid, Zofran , morphine  -Reevaluation of the patient after these medicines showed that the patient improved -PCP office notes or outside notes reviewed -Escalation to admission/observation considered: patients feels much better, is comfortable with discharge, and will follow up with her surgeon as scheduled tomorrow -Prescription medication considered, patient comfortable with OTC meds -Social Determinant of Health considered which includes tobacco use, housing difficulty, stress, social isolation      Final diagnoses:  Left lower quadrant abdominal pain    ED Discharge Orders          Ordered    dicyclomine  (BENTYL ) 20 MG tablet  3 times daily before meals        09/09/23 1303               Nivia Colon, PA-C 09/09/23 1303    Ula Prentice SAUNDERS, MD 09/09/23 [phone]

## 2023-09-09 NOTE — ED Notes (Signed)
 Pt reported 10/10 pain and requesting pain meds. MD notified.

## 2023-09-09 NOTE — ED Notes (Signed)
 Pt endorses nausea and 10/10 pain. MD and PA notified.

## 2023-09-09 NOTE — ED Notes (Signed)
 Patient transported to CT

## 2023-09-15 NOTE — Telephone Encounter (Signed)
 Pt was contacted in regard to previous notes.  Pt stated that he LLQ pain ( aching)  started back yesterday and it is a 10 out of 10. Taking Tylenol  with no relief. Pt states that she feels that her stomach is also burning, some nausea, no vomiting, no fever, Last BM on Sunday ( 2 days ago) formed stool. Please review and advise.

## 2023-09-16 ENCOUNTER — Ambulatory Visit: Attending: Physician Assistant

## 2023-09-16 ENCOUNTER — Other Ambulatory Visit: Payer: Self-pay

## 2023-09-16 DIAGNOSIS — M25512 Pain in left shoulder: Secondary | ICD-10-CM | POA: Insufficient documentation

## 2023-09-16 DIAGNOSIS — G8929 Other chronic pain: Secondary | ICD-10-CM | POA: Insufficient documentation

## 2023-09-16 DIAGNOSIS — R293 Abnormal posture: Secondary | ICD-10-CM | POA: Diagnosis present

## 2023-09-16 DIAGNOSIS — M6281 Muscle weakness (generalized): Secondary | ICD-10-CM | POA: Insufficient documentation

## 2023-09-16 NOTE — Therapy (Signed)
 OUTPATIENT PHYSICAL THERAPY SHOULDER EVALUATION   Patient Name: Natalie Edwards MRN: [id number] DOB:1965-04-08, 58 y.o., female Today's Date: 09/17/2023  END OF SESSION:  PT End of Session - 09/17/23 1005     Visit Number 1    Number of Visits 17    Date for PT Re-Evaluation 11/11/23    Authorization Type [employer] Dual Complete    PT Start Time 1605    PT Stop Time 1642    PT Time Calculation (min) 37 min    Behavior During Therapy WFL for tasks assessed/performed          Past Medical History:  Diagnosis Date   Allergy    Anemia    Diverticulosis    Gall bladder stones 1993   Gastric ulcer    GERD (gastroesophageal reflux disease)    Hypertension    Renal mass    Past Surgical History:  Procedure Laterality Date   ABDOMINAL HYSTERECTOMY     BIOPSY  09/29/2022   Procedure: BIOPSY;  Surgeon: Shila Gustav GAILS, MD;  Location: [facility] ENDOSCOPY;  Service: Gastroenterology;;   CHOLECYSTECTOMY     COLONOSCOPY WITH ESOPHAGOGASTRODUODENOSCOPY (EGD)  04/07/2023   [facility]   ESOPHAGOGASTRODUODENOSCOPY (EGD) WITH PROPOFOL  N/A 09/29/2022   Procedure: ESOPHAGOGASTRODUODENOSCOPY (EGD) WITH PROPOFOL ;  Surgeon: Shila Gustav GAILS, MD;  Location: [facility] ENDOSCOPY;  Service: Gastroenterology;  Laterality: N/A;   Patient Active Problem List   Diagnosis Date Noted   Pain and swelling of lower extremity 05/28/2023   MDD (major depressive disorder), recurrent episode, severe (HCC) 05/26/2023   Ingestion of substance, intentional self-harm, sequela (HCC) 05/23/2023   SIRS (systemic inflammatory response syndrome) (HCC) 05/23/2023   Rhabdomyolysis 05/23/2023   Cocaine abuse (HCC) 05/23/2023   Intractable nausea and vomiting 01/18/2023   MDD (major depressive disorder) 12/23/2022   MDD (major depressive disorder), recurrent severe, without psychosis (HCC) 12/22/2022   Grief 12/22/2022   Acute diverticulitis 10/17/2022   Melena 09/29/2022   Gastric ulcer without hemorrhage or perforation  09/29/2022   AKI (acute kidney injury) (HCC) 09/28/2022   Metabolic acidosis 09/28/2022   Epigastric pain 09/28/2022   Nausea 09/28/2022   Hyperlipidemia 12/14/2021   Prediabetes 12/14/2021   Anxiety and depression 05/14/2021   Breathing difficult 05/14/2021   GERD (gastroesophageal reflux disease)    Possible exposure to STD 01/14/2021   Chronic insomnia 10/18/2019   CKD (chronic kidney disease) 04/07/2019   GAD (generalized anxiety disorder) 05/07/2018   Chronic gastric ulcer 05/07/2018   Essential hypertension 05/07/2018   Iron  deficiency anemia due to chronic blood loss 09/23/2016   Peptic ulcer disease with hemorrhage 09/23/2016   Thrombocytosis 09/09/2016   Leukocytosis/thrombocytosis 09/09/2016   Gastric nodule 09/09/2016   H/O ulcer disease 07/31/2016    PCP: Tanda Bleacher, MD  REFERRING PROVIDER: Persons, Ronal Dragon, [place]  REFERRING DIAG: [phone] (ICD-10-CM) - Chronic left shoulder pain   THERAPY DIAG:  Left shoulder pain, unspecified chronicity  Muscle weakness (generalized)  Abnormal posture  Rationale for Evaluation and Treatment: Rehabilitation  ONSET DATE: Chronic  SUBJECTIVE:  SUBJECTIVE STATEMENT: Pt presents to PT with reports of acute on chronic L shoulder pain stemming from fall during syncopal episode in March of this year. Pain refers into bicep, has N/T down L UE all the way to hand. Denies change in grip strength or quality, ADLs are very limited secondary to limited L shoulder ROM.  Hand dominance: Right  PERTINENT HISTORY: HTN  PAIN:  Are you having pain?  Yes: NPRS scale: 0/10 Worst: 10/10 Pain location: L anterior shoulder, L bicep Pain description: sharp, sore, N/T Aggravating factors: OH movement, lifting, reaching Relieving factors: rest,  ice  PRECAUTIONS: None  RED FLAGS: None   WEIGHT BEARING RESTRICTIONS: No  FALLS:  Has patient fallen in last 6 months? Yes. Number of falls - one fall in March due to other comorbid conditions  LIVING ENVIRONMENT: Lives with: lives alone Lives in: House/apartment  OCCUPATION: Not currently working  PLOF: Independent  PATIENT GOALS: decrease L shoulder pain, be able to reach overhead easier, reach behind back  NEXT MD VISIT: 09/17/2023  OBJECTIVE:  Note: Objective measures were completed at Evaluation unless otherwise noted.  DIAGNOSTIC FINDINGS:  See imaging   PATIENT SURVEYS:  Quick DASH: 60% disability  COGNITION: Overall cognitive status: Within functional limits for tasks assessed     SENSATION: Light touch: Impaired - LUE  POSTURE: Rounded shoulders, fwd head  UPPER EXTREMITY ROM:   Active ROM Right eval Left eval  Shoulder flexion WFL 54  Shoulder extension    Shoulder abduction WFL 50  Shoulder adduction    Shoulder internal rotation WFL   Shoulder external rotation WFL 22  Elbow flexion    Elbow extension    Wrist flexion    Wrist extension    Wrist ulnar deviation    Wrist radial deviation    Wrist pronation    Wrist supination    (Blank rows = not tested)  UPPER EXTREMITY MMT:  MMT Right eval Left eval  Shoulder flexion    Shoulder extension    Shoulder abduction    Shoulder adduction    Shoulder internal rotation    Shoulder external rotation 4/5 2+/5  Middle trapezius    Lower trapezius    Elbow flexion    Elbow extension    Wrist flexion    Wrist extension    Wrist ulnar deviation    Wrist radial deviation    Wrist pronation    Wrist supination    Grip strength (lbs)    (Blank rows = not tested)  JOINT MOBILITY TESTING:  L GH hypomobility  PALPATION:  TTP to L infraspinatus, L upper trap   TREATMENT: [facility] Adult PT Treatment:                                                DATE: 09/17/2023 Therapeutic Exercise: R  shoulder IR/ER isometric x 5 - 5 hold L Seated scapular retraction x 5 Seated shoulder table slide x 5 flex L  PATIENT EDUCATION: Education details: eval findings, Quick DASH, HEP, POC Person educated: Patient Education method: Explanation, Demonstration, and Handouts Education comprehension: verbalized understanding and returned demonstration  HOME EXERCISE PROGRAM: Access Code: [email or web address] URL: [email or web address] Date: 09/17/2023 Prepared by: Alm Kingdom  Exercises - Standing Isometric Shoulder External Rotation with Doorway and Towel Roll  - 1 x daily - 7 x weekly - 2 sets - 10  reps - 5 sec hold - Standing Isometric Shoulder Internal Rotation with Towel Roll at Doorway  - 1 x daily - 7 x weekly - 2 sets - 10 reps - 5 sec hold - Seated Scapular Retraction  - 1 x daily - 7 x weekly - 3 sets - 10 reps - 3 sec hold - Seated Shoulder Flexion Towel Slide at Table Top  - 1 x daily - 7 x weekly - 2 sets - 10 reps - 5 sec hold  ASSESSMENT:  CLINICAL IMPRESSION: Patient is a 58 y.o. F who was seen today for physical therapy evaluation and treatment for acute on chronic severe L shoulder pain. Physical findings are consistent with referring provider impression as pt demonstrates severe limitations in L shoulder ROM and ER strength. Quick DASH shows severe disability in performance of home ADLs and community activities. Pt would benefit from skilled PT services working on improving periscapular strength and shoulder ROM in order to improve comfort and function.   OBJECTIVE IMPAIRMENTS: decreased activity tolerance, decreased mobility, decreased ROM, decreased strength, impaired UE functional use, postural dysfunction, and pain  ACTIVITY LIMITATIONS: carrying, lifting, bathing, toileting, dressing, reach over head, and caring for others  PARTICIPATION LIMITATIONS: meal prep, cleaning, driving, shopping, community activity, occupation, and yard work  PERSONAL FACTORS: Time since  onset of injury/illness/exacerbation and 1 comorbidity: HTN are also affecting patient's functional outcome.   REHAB POTENTIAL: Good  CLINICAL DECISION MAKING: Stable/uncomplicated  EVALUATION COMPLEXITY: Low   GOALS: Goals reviewed with patient? No  SHORT TERM GOALS: Target date: 10/07/2023   Pt will be compliant and knowledgeable with initial HEP for improved comfort and carryover Baseline: initial HEP given  Goal status: INITIAL  2.  Pt will self report left shoulder pain no greater than 7/10 for improved comfort and functional ability Baseline: 10/10 at worst Goal status: INITIAL   LONG TERM GOALS: Target date: 11/11/2023   Pt will decrease Quick DASH disability score to no greater than 45% as proxy for functional improvement with home ADLs and community activities Baseline: 60% disability  Goal status: INITIAL  2.  Pt will self report left shoulder pain no greater than 3/10 for improved comfort and functional ability Baseline: 10/10 at worst Goal status: INITIAL   3.  Pt will improve L shoulder flexion to at least 120 degrees with no greater than 3/10 pain for improved OH reaching and functional ADL performance Baseline: 54 degrees Goal status: INITIAL  4.  Pt will improve L ER strength to at least 3+/5 for improved dynamic stability with OH reaching and improved function Baseline: 2+/5 Goal status: INITIAL   PLAN:  PT FREQUENCY: 1-2x/week  PT DURATION: 8 weeks  PLANNED INTERVENTIONS: 97164- PT Re-evaluation, 97110-Therapeutic exercises, 97530- Therapeutic activity, [id number]- Neuromuscular re-education, 97535- Self Care, [id number]- Manual therapy, [id number]- Electrical stimulation (unattended), [id number]- Electrical stimulation (manual), 97016- Vasopneumatic device, 20560 (1-2 muscles), 20561 (3+ muscles)- Dry Needling, Cryotherapy, and Moist heat  PLAN FOR NEXT SESSION: assess HEP response, periscapular and ER strengthening, manual therapy   Alm JAYSON Kingdom, PT 09/17/2023,  10:06 AM

## 2023-09-17 ENCOUNTER — Telehealth: Payer: Self-pay | Admitting: Physician Assistant

## 2023-09-17 ENCOUNTER — Ambulatory Visit: Admitting: Physician Assistant

## 2023-09-17 DIAGNOSIS — M25512 Pain in left shoulder: Secondary | ICD-10-CM | POA: Insufficient documentation

## 2023-09-17 DIAGNOSIS — G8929 Other chronic pain: Secondary | ICD-10-CM | POA: Diagnosis not present

## 2023-09-17 DIAGNOSIS — M25511 Pain in right shoulder: Secondary | ICD-10-CM | POA: Diagnosis not present

## 2023-09-17 NOTE — Progress Notes (Signed)
 Office Visit Note   Patient: Natalie Edwards           Date of Birth: [birth year]           MRN: [id number] Visit Date: 09/17/2023              Requested by: Tanda Bleacher, MD [address] [place],  [place] [address] PCP: Tanda Bleacher, MD      HPI: Remie is a very pleasant woman with ongoing difficulties of over 2 months with her left shoulder.  She thinks this was related to a fall.  At her last visit I prescribed physical therapy findings consistent with rotator cuff possible biceps tendon proximal rupture.  She comes in today she is to start a physical therapy but her motion is getting progressively worse.  She denies any reinjury  Assessment & Plan: Visit Diagnoses: Left shoulder pain   Plan: Will continue with therapy however given her exam of decreasing motion in her left shoulder I am concerned about adhesive capsulitis and rotator cuff tear.  Will follow order an MRI  Follow-Up Instructions: No follow-ups on file.   Ortho Exam  Patient is alert, oriented, no adenopathy, well-dressed, normal affect, normal respiratory effort. Examination of her shoulder she has decreased active and passive range of motion however strength is intact.  She can go to about 95 degrees difficulty internally rotating her arm into her back pocket.  She has actually fairly good external rotation good grip strength she is neurovascularly intact    Imaging: No results found. No images are attached to the encounter.  Labs: Lab Results  Component Value Date   HGBA1C 5.2 05/22/2023   HGBA1C 5.7 (H) 12/13/2021   HGBA1C 5.7 (H) 05/31/2018   ESRSEDRATE 45 (H) 09/02/2023   ESRSEDRATE 38 (H) 03/08/2019   ESRSEDRATE 16 07/28/2018   CRP <1.0 09/02/2023   CRP 4.4 (H) 05/25/2023   CRP 6.7 (H) 05/24/2023   REPTSTATUS 05/26/2023 FINAL 05/23/2023   CULT (A) 05/23/2023    >=100,000 COLONIES/mL KLEBSIELLA AEROGENES 80,000 COLONIES/mL ESCHERICHIA COLI Confirmed Extended Spectrum  Beta-Lactamase Producer (ESBL).  In bloodstream infections from ESBL organisms, carbapenems are preferred over piperacillin /tazobactam. They are shown to have a lower risk of mortality.    LABORGA KLEBSIELLA AEROGENES (A) 05/23/2023   LABORGA ESCHERICHIA COLI (A) 05/23/2023     Lab Results  Component Value Date   ALBUMIN 3.7 09/09/2023   ALBUMIN 4.4 09/02/2023   ALBUMIN 3.2 (L) 08/30/2023    Lab Results  Component Value Date   MG 1.7 05/30/2023   MG 1.6 (L) 05/26/2023   MG 1.6 (L) 05/25/2023   No results found for: VD25OH  No results found for: PREALBUMIN    Latest Ref Rng & Units 09/09/2023    8:34 AM 09/02/2023    2:49 PM 08/30/2023    6:12 PM  CBC EXTENDED  WBC 4.0 - 10.5 K/uL 12.5  12.1  15.8   RBC 3.87 - 5.11 MIL/uL 4.66  4.74  3.93   Hemoglobin 12.0 - 15.0 g/dL 87.4  87.6  89.3   HCT 36.0 - 46.0 % 38.6  38.4  33.6   Platelets 150 - 400 K/uL 675  564.0  479   NEUT# 1.7 - 7.7 K/uL 7.7  6.5    Lymph# 0.7 - 4.0 K/uL 3.6  4.3       There is no height or weight on file to calculate BMI.  Orders:  No orders of the defined types were  placed in this encounter.  No orders of the defined types were placed in this encounter.    Procedures: No procedures performed  Clinical Data: No additional findings.  ROS:  All other systems negative, except as noted in the HPI. Review of Systems  Objective: Vital Signs: There were no vitals taken for this visit.  Specialty Comments:  No specialty comments available.  PMFS History: Patient Active Problem List   Diagnosis Date Noted   Pain in left shoulder 09/17/2023   Pain and swelling of lower extremity 05/28/2023   MDD (major depressive disorder), recurrent episode, severe (HCC) 05/26/2023   Ingestion of substance, intentional self-harm, sequela (HCC) 05/23/2023   SIRS (systemic inflammatory response syndrome) (HCC) 05/23/2023   Rhabdomyolysis 05/23/2023   Cocaine abuse (HCC) 05/23/2023   Intractable nausea and  vomiting 01/18/2023   MDD (major depressive disorder) 12/23/2022   MDD (major depressive disorder), recurrent severe, without psychosis (HCC) 12/22/2022   Grief 12/22/2022   Acute diverticulitis 10/17/2022   Melena 09/29/2022   Gastric ulcer without hemorrhage or perforation 09/29/2022   AKI (acute kidney injury) (HCC) 09/28/2022   Metabolic acidosis 09/28/2022   Epigastric pain 09/28/2022   Nausea 09/28/2022   Hyperlipidemia 12/14/2021   Prediabetes 12/14/2021   Anxiety and depression 05/14/2021   Breathing difficult 05/14/2021   GERD (gastroesophageal reflux disease)    Possible exposure to STD 01/14/2021   Chronic insomnia 10/18/2019   CKD (chronic kidney disease) 04/07/2019   GAD (generalized anxiety disorder) 05/07/2018   Chronic gastric ulcer 05/07/2018   Essential hypertension 05/07/2018   Iron  deficiency anemia due to chronic blood loss 09/23/2016   Peptic ulcer disease with hemorrhage 09/23/2016   Thrombocytosis 09/09/2016   Leukocytosis/thrombocytosis 09/09/2016   Gastric nodule 09/09/2016   H/O ulcer disease 07/31/2016   Past Medical History:  Diagnosis Date   Allergy    Anemia    Diverticulosis    Gall bladder stones 1993   Gastric ulcer    GERD (gastroesophageal reflux disease)    Hypertension    Renal mass     Family History  Problem Relation Age of Onset   Cancer Maternal Uncle        Lung   Cancer Maternal Uncle        Lung   Cancer Maternal Uncle        Lung   Headache Neg Hx        I don't think so   Migraines Neg Hx        I don't think so    Past Surgical History:  Procedure Laterality Date   ABDOMINAL HYSTERECTOMY     BIOPSY  09/29/2022   Procedure: BIOPSY;  Surgeon: Shila Gustav GAILS, MD;  Location: [facility] ENDOSCOPY;  Service: Gastroenterology;;   CHOLECYSTECTOMY     COLONOSCOPY WITH ESOPHAGOGASTRODUODENOSCOPY (EGD)  04/07/2023   [facility] at [facility]   ESOPHAGOGASTRODUODENOSCOPY (EGD) WITH PROPOFOL  N/A 09/29/2022   Procedure:  ESOPHAGOGASTRODUODENOSCOPY (EGD) WITH PROPOFOL ;  Surgeon: Shila Gustav GAILS, MD;  Location: [facility] ENDOSCOPY;  Service: Gastroenterology;  Laterality: N/A;   Social History   Occupational History   Occupation: environmental services at [employer]  Tobacco Use   Smoking status: Every Day    Current packs/day: 0.50    Average packs/day: 0.5 packs/day for 25.0 years (12.5 ttl pk-yrs)    Types: Cigarettes   Smokeless tobacco: Never   Tobacco comments:    Pt tried to quit - helps with anxiety     1 pack last patient 3 days  Vaping Use   Vaping status: Never Used  Substance and Sexual Activity   Alcohol use: Yes    Comment: occasionally - last drink was July 4, 1 shot   Drug use: No   Sexual activity: Yes    Comment: hysterectomy

## 2023-09-17 NOTE — Telephone Encounter (Signed)
 [facility] called and needs to know did you want it with contrast or authorgram for her. CB#[phone]

## 2023-09-17 NOTE — Addendum Note (Signed)
 Addended by: RODGERS LACY on: 09/17/2023 03:26 PM   Modules accepted: Orders

## 2023-09-21 ENCOUNTER — Encounter: Payer: Self-pay | Admitting: Physical Therapy

## 2023-09-21 ENCOUNTER — Ambulatory Visit: Payer: Self-pay | Admitting: Physical Therapy

## 2023-09-21 DIAGNOSIS — M6281 Muscle weakness (generalized): Secondary | ICD-10-CM

## 2023-09-21 DIAGNOSIS — M25512 Pain in left shoulder: Secondary | ICD-10-CM | POA: Diagnosis not present

## 2023-09-21 NOTE — Therapy (Signed)
 OUTPATIENT PHYSICAL THERAPY SHOULDER TREATMENT   Patient Name: Natalie Edwards MRN: [id number] DOB:[birth year], 58 y.o., female Today's Date: 09/21/2023  END OF SESSION:  PT End of Session - 09/21/23 0932     Visit Number 2    Number of Visits 17    Date for PT Re-Evaluation 11/11/23    Authorization Type [employer] Dual Complete    PT Start Time 0932    PT Stop Time 1010    PT Time Calculation (min) 38 min          Past Medical History:  Diagnosis Date   Allergy    Anemia    Diverticulosis    Gall bladder stones 1993   Gastric ulcer    GERD (gastroesophageal reflux disease)    Hypertension    Renal mass    Past Surgical History:  Procedure Laterality Date   ABDOMINAL HYSTERECTOMY     BIOPSY  09/29/2022   Procedure: BIOPSY;  Surgeon: Shila Gustav GAILS, MD;  Location: [facility] ENDOSCOPY;  Service: Gastroenterology;;   CHOLECYSTECTOMY     COLONOSCOPY WITH ESOPHAGOGASTRODUODENOSCOPY (EGD)  04/07/2023   [facility] at [facility]   ESOPHAGOGASTRODUODENOSCOPY (EGD) WITH PROPOFOL  N/A 09/29/2022   Procedure: ESOPHAGOGASTRODUODENOSCOPY (EGD) WITH PROPOFOL ;  Surgeon: Shila Gustav GAILS, MD;  Location: [facility] ENDOSCOPY;  Service: Gastroenterology;  Laterality: N/A;   Patient Active Problem List   Diagnosis Date Noted   Pain in left shoulder 09/17/2023   Pain and swelling of lower extremity 05/28/2023   MDD (major depressive disorder), recurrent episode, severe (HCC) 05/26/2023   Ingestion of substance, intentional self-harm, sequela (HCC) 05/23/2023   SIRS (systemic inflammatory response syndrome) (HCC) 05/23/2023   Rhabdomyolysis 05/23/2023   Cocaine abuse (HCC) 05/23/2023   Intractable nausea and vomiting 01/18/2023   MDD (major depressive disorder) 12/23/2022   MDD (major depressive disorder), recurrent severe, without psychosis (HCC) 12/22/2022   Grief 12/22/2022   Acute diverticulitis 10/17/2022   Melena 09/29/2022   Gastric ulcer without hemorrhage or perforation 09/29/2022   AKI (acute  kidney injury) (HCC) 09/28/2022   Metabolic acidosis 09/28/2022   Epigastric pain 09/28/2022   Nausea 09/28/2022   Hyperlipidemia 12/14/2021   Prediabetes 12/14/2021   Anxiety and depression 05/14/2021   Breathing difficult 05/14/2021   GERD (gastroesophageal reflux disease)    Possible exposure to STD 01/14/2021   Chronic insomnia 10/18/2019   CKD (chronic kidney disease) 04/07/2019   GAD (generalized anxiety disorder) 05/07/2018   Chronic gastric ulcer 05/07/2018   Essential hypertension 05/07/2018   Iron  deficiency anemia due to chronic blood loss 09/23/2016   Peptic ulcer disease with hemorrhage 09/23/2016   Thrombocytosis 09/09/2016   Leukocytosis/thrombocytosis 09/09/2016   Gastric nodule 09/09/2016   H/O ulcer disease 07/31/2016    PCP: Tanda Bleacher, MD  REFERRING PROVIDER: Persons, Ronal Dragon, [place]  REFERRING DIAG: [phone] (ICD-10-CM) - Chronic left shoulder pain   THERAPY DIAG:  Left shoulder pain, unspecified chronicity  Muscle weakness (generalized)  Rationale for Evaluation and Treatment: Rehabilitation  ONSET DATE: Chronic  SUBJECTIVE:  SUBJECTIVE STATEMENT: 8/10 pain on arrival, superior shoulder. Performed HEP 1 x since evaluation.   EVAL: Pt presents to PT with reports of acute on chronic L shoulder pain stemming from fall during syncopal episode in March of this year. Pain refers into bicep, has N/T down L UE all the way to hand. Denies change in grip strength or quality, ADLs are very limited secondary to limited L shoulder ROM.  Hand dominance: Right  PERTINENT HISTORY: HTN  PAIN:  Are you having pain?  Yes: NPRS scale: 8/10 Worst: 10/10 Pain location: L anterior shoulder, L bicep Pain description: sharp, sore, N/T Aggravating factors: OH movement, lifting,  reaching Relieving factors: rest, ice  PRECAUTIONS: None  RED FLAGS: None   WEIGHT BEARING RESTRICTIONS: No  FALLS:  Has patient fallen in last 6 months? Yes. Number of falls - one fall in March due to other comorbid conditions  LIVING ENVIRONMENT: Lives with: lives alone Lives in: House/apartment  OCCUPATION: Not currently working  PLOF: Independent  PATIENT GOALS: decrease L shoulder pain, be able to reach overhead easier, reach behind back  NEXT MD VISIT: 09/17/2023  OBJECTIVE:  Note: Objective measures were completed at Evaluation unless otherwise noted.  DIAGNOSTIC FINDINGS:  See imaging   PATIENT SURVEYS:  Quick DASH: 60% disability  COGNITION: Overall cognitive status: Within functional limits for tasks assessed     SENSATION: Light touch: Impaired - LUE  POSTURE: Rounded shoulders, fwd head  UPPER EXTREMITY ROM:   Active ROM Right eval Left eval Left  09/21/23  Shoulder flexion WFL 54 85 Arom  Shoulder extension     Shoulder abduction WFL 50   Shoulder adduction     Shoulder internal rotation WFL    Shoulder external rotation WFL 22 50 Prom  Elbow flexion     Elbow extension     Wrist flexion     Wrist extension     Wrist ulnar deviation     Wrist radial deviation     Wrist pronation     Wrist supination     (Blank rows = not tested)  UPPER EXTREMITY MMT:  MMT Right eval Left eval  Shoulder flexion    Shoulder extension    Shoulder abduction    Shoulder adduction    Shoulder internal rotation    Shoulder external rotation 4/5 2+/5  Middle trapezius    Lower trapezius    Elbow flexion    Elbow extension    Wrist flexion    Wrist extension    Wrist ulnar deviation    Wrist radial deviation    Wrist pronation    Wrist supination    Grip strength (lbs)    (Blank rows = not tested)  JOINT MOBILITY TESTING:  L GH hypomobility  PALPATION:  TTP to L infraspinatus, L upper trap   TREATMENT: [facility] Adult PT Treatment:                                                 DATE: 09/21/23 Therapeutic Exercise: Passive shoulder flexion and ER , left ER iso 3 sec x 10 IR iso 3 sec x 10 Seated scap retract x 10   Therapeutic Activity: Supine clasped hand chest press  Supine AAROM with Dowel  Seated table slides for flexion 115 degrees  Seated table slides , scaption, low tolerance for small ROM Modalities:  Cold pack x 8 minutes concurrent with self care Self Care: Cold pack applications frequently to reduce and manage pain. Pt with low tolerance to ice. Will try heat at next appt.    [facility] Adult PT Treatment:                                                DATE: 09/17/2023 Therapeutic Exercise: R shoulder IR/ER isometric x 5 - 5 hold L Seated scapular retraction x 5 Seated shoulder table slide x 5 flex L  PATIENT EDUCATION: Education details: eval findings, Quick DASH, HEP, POC Person educated: Patient Education method: Explanation, Demonstration, and Handouts Education comprehension: verbalized understanding and returned demonstration  HOME EXERCISE PROGRAM: Access Code: [email or web address] URL: [email or web address] Date: 09/17/2023 Prepared by: Alm Kingdom  Exercises - Standing Isometric Shoulder External Rotation with Doorway and Towel Roll  - 1 x daily - 7 x weekly - 2 sets - 10 reps - 5 sec hold - Standing Isometric Shoulder Internal Rotation with Towel Roll at Doorway  - 1 x daily - 7 x weekly - 2 sets - 10 reps - 5 sec hold - Seated Scapular Retraction  - 1 x daily - 7 x weekly - 3 sets - 10 reps - 3 sec hold - Seated Shoulder Flexion Towel Slide at Table Top  - 1 x daily - 7 x weekly - 2 sets - 10 reps - 5 sec hold  ASSESSMENT:  CLINICAL IMPRESSION: Pt reports continued pain and min compliance with HEP. Hoping to be scheduled for MRI of shoulder. Pain level 8/10. Reviewed HEP and performed self care to educate on pain management with cold or heat applications. Cold pack trial in clinic with low  tolerance due to cold sensitivity. Encouraged compliance with HEP. Should consider estim / heat trial if pain still elevated.    EVAL: Patient is a 58 y.o. F who was seen today for physical therapy evaluation and treatment for acute on chronic severe L shoulder pain. Physical findings are consistent with referring provider impression as pt demonstrates severe limitations in L shoulder ROM and ER strength. Quick DASH shows severe disability in performance of home ADLs and community activities. Pt would benefit from skilled PT services working on improving periscapular strength and shoulder ROM in order to improve comfort and function.   OBJECTIVE IMPAIRMENTS: decreased activity tolerance, decreased mobility, decreased ROM, decreased strength, impaired UE functional use, postural dysfunction, and pain  ACTIVITY LIMITATIONS: carrying, lifting, bathing, toileting, dressing, reach over head, and caring for others  PARTICIPATION LIMITATIONS: meal prep, cleaning, driving, shopping, community activity, occupation, and yard work  PERSONAL FACTORS: Time since onset of injury/illness/exacerbation and 1 comorbidity: HTN are also affecting patient's functional outcome.   REHAB POTENTIAL: Good  CLINICAL DECISION MAKING: Stable/uncomplicated  EVALUATION COMPLEXITY: Low   GOALS: Goals reviewed with patient? No  SHORT TERM GOALS: Target date: 10/07/2023   Pt will be compliant and knowledgeable with initial HEP for improved comfort and carryover Baseline: initial HEP given  Goal status: INITIAL  2.  Pt will self report left shoulder pain no greater than 7/10 for improved comfort and functional ability Baseline: 10/10 at worst Goal status: INITIAL   LONG TERM GOALS: Target date: 11/11/2023   Pt will decrease Quick DASH disability score to no greater than 45% as proxy for functional improvement with home ADLs and community activities  Baseline: 60% disability  Goal status: INITIAL  2.  Pt will self  report left shoulder pain no greater than 3/10 for improved comfort and functional ability Baseline: 10/10 at worst Goal status: INITIAL   3.  Pt will improve L shoulder flexion to at least 120 degrees with no greater than 3/10 pain for improved OH reaching and functional ADL performance Baseline: 54 degrees Goal status: INITIAL  4.  Pt will improve L ER strength to at least 3+/5 for improved dynamic stability with OH reaching and improved function Baseline: 2+/5 Goal status: INITIAL   PLAN:  PT FREQUENCY: 1-2x/week  PT DURATION: 8 weeks  PLANNED INTERVENTIONS: 97164- PT Re-evaluation, 97110-Therapeutic exercises, 97530- Therapeutic activity, 97112- Neuromuscular re-education, 97535- Self Care, 02859- Manual therapy, [id number]- Electrical stimulation (unattended), [id number]- Electrical stimulation (manual), 97016- Vasopneumatic device, 20560 (1-2 muscles), 20561 (3+ muscles)- Dry Needling, Cryotherapy, and Moist heat  PLAN FOR NEXT SESSION: assess HEP response, periscapular and ER strengthening, manual therapy   Harlene Persons, PTA 09/21/23 10:14 AM Phone: [phone] Fax: [phone]

## 2023-09-22 ENCOUNTER — Emergency Department
Admission: EM | Admit: 2023-09-22 | Discharge: 2023-09-23 | Disposition: A | Discharge status: Home / Self Care | Attending: Emergency Medicine | Admitting: Emergency Medicine

## 2023-09-22 ENCOUNTER — Emergency Department

## 2023-09-22 ENCOUNTER — Other Ambulatory Visit: Payer: Self-pay | Admitting: Physician Assistant

## 2023-09-22 ENCOUNTER — Telehealth: Payer: Self-pay | Admitting: Radiology

## 2023-09-22 DIAGNOSIS — F332 Major depressive disorder, recurrent severe without psychotic features: Secondary | ICD-10-CM | POA: Diagnosis not present

## 2023-09-22 DIAGNOSIS — N189 Chronic kidney disease, unspecified: Secondary | ICD-10-CM | POA: Insufficient documentation

## 2023-09-22 DIAGNOSIS — R1032 Left lower quadrant pain: Secondary | ICD-10-CM | POA: Diagnosis present

## 2023-09-22 DIAGNOSIS — Y900 Blood alcohol level of less than 20 mg/100 ml: Secondary | ICD-10-CM | POA: Diagnosis not present

## 2023-09-22 DIAGNOSIS — F32A Depression, unspecified: Secondary | ICD-10-CM

## 2023-09-22 DIAGNOSIS — F1590 Other stimulant use, unspecified, uncomplicated: Secondary | ICD-10-CM | POA: Insufficient documentation

## 2023-09-22 DIAGNOSIS — F1090 Alcohol use, unspecified, uncomplicated: Secondary | ICD-10-CM | POA: Diagnosis not present

## 2023-09-22 DIAGNOSIS — F321 Major depressive disorder, single episode, moderate: Secondary | ICD-10-CM | POA: Diagnosis not present

## 2023-09-22 DIAGNOSIS — Z79899 Other long term (current) drug therapy: Secondary | ICD-10-CM | POA: Diagnosis not present

## 2023-09-22 DIAGNOSIS — I129 Hypertensive chronic kidney disease with stage 1 through stage 4 chronic kidney disease, or unspecified chronic kidney disease: Secondary | ICD-10-CM | POA: Diagnosis not present

## 2023-09-22 DIAGNOSIS — R110 Nausea: Secondary | ICD-10-CM | POA: Insufficient documentation

## 2023-09-22 DIAGNOSIS — G8929 Other chronic pain: Secondary | ICD-10-CM

## 2023-09-22 LAB — URINALYSIS, ROUTINE W REFLEX MICROSCOPIC
Bacteria, UA: NONE SEEN
Bilirubin Urine: NEGATIVE
Glucose, UA: NEGATIVE mg/dL
Hgb urine dipstick: NEGATIVE
Ketones, ur: NEGATIVE mg/dL
Leukocytes,Ua: NEGATIVE
Nitrite: NEGATIVE
Protein, ur: 100 mg/dL — AB
Specific Gravity, Urine: >1.046 — ABNORMAL HIGH (ref 1.005–1.030)
pH: 5 (ref 5.0–8.0)

## 2023-09-22 LAB — CBC WITH DIFFERENTIAL/PLATELET
Abs Immature Granulocytes: 0.1 K/uL — ABNORMAL HIGH (ref 0.00–0.07)
Basophils Absolute: 0.1 K/uL (ref 0.0–0.1)
Basophils Relative: 0 %
Eosinophils Absolute: 0.2 K/uL (ref 0.0–0.5)
Eosinophils Relative: 1 %
HCT: 38.5 % (ref 36.0–46.0)
Hemoglobin: 12.6 g/dL (ref 12.0–15.0)
Immature Granulocytes: 1 %
Lymphocytes Relative: 41 %
Lymphs Abs: 6.4 K/uL — ABNORMAL HIGH (ref 0.7–4.0)
MCH: 26.8 pg (ref 26.0–34.0)
MCHC: 32.7 g/dL (ref 30.0–36.0)
MCV: 81.9 fL (ref 80.0–100.0)
Monocytes Absolute: 1 K/uL (ref 0.1–1.0)
Monocytes Relative: 6 %
Neutro Abs: 7.9 K/uL — ABNORMAL HIGH (ref 1.7–7.7)
Neutrophils Relative %: 51 %
Platelets: 511 K/uL — ABNORMAL HIGH (ref 150–400)
RBC: 4.7 MIL/uL (ref 3.87–5.11)
RDW: 18 % — ABNORMAL HIGH (ref 11.5–15.5)
WBC: 15.6 K/uL — ABNORMAL HIGH (ref 4.0–10.5)
nRBC: 0 % (ref 0.0–0.2)

## 2023-09-22 LAB — ACETAMINOPHEN LEVEL: Acetaminophen (Tylenol), Serum: 18 ug/mL (ref 10–30)

## 2023-09-22 LAB — URINE DRUG SCREEN, QUALITATIVE (ARMC ONLY)
Amphetamines, Ur Screen: NOT DETECTED
Barbiturates, Ur Screen: NOT DETECTED
Benzodiazepine, Ur Scrn: NOT DETECTED
Cannabinoid 50 Ng, Ur ~~LOC~~: NOT DETECTED
Cocaine Metabolite,Ur ~~LOC~~: NOT DETECTED
MDMA (Ecstasy)Ur Screen: NOT DETECTED
Methadone Scn, Ur: NOT DETECTED
Opiate, Ur Screen: POSITIVE — AB
Phencyclidine (PCP) Ur S: NOT DETECTED
Tricyclic, Ur Screen: POSITIVE — AB

## 2023-09-22 LAB — COMPREHENSIVE METABOLIC PANEL WITH GFR
ALT: 11 U/L (ref 0–44)
AST: 15 U/L (ref 15–41)
Albumin: 3.6 g/dL (ref 3.5–5.0)
Alkaline Phosphatase: 55 U/L (ref 38–126)
Anion gap: 8 (ref 5–15)
BUN: 12 mg/dL (ref 6–20)
CO2: 20 mmol/L — ABNORMAL LOW (ref 22–32)
Calcium: 9.2 mg/dL (ref 8.9–10.3)
Chloride: 113 mmol/L — ABNORMAL HIGH (ref 98–111)
Creatinine, Ser: 1 mg/dL (ref 0.44–1.00)
GFR, Estimated: >60 mL/min (ref >60)
Glucose, Bld: 96 mg/dL (ref 70–99)
Potassium: 4.1 mmol/L (ref 3.5–5.1)
Sodium: 141 mmol/L (ref 135–145)
Total Bilirubin: 0.3 mg/dL (ref 0.0–1.2)
Total Protein: 7.5 g/dL (ref 6.5–8.1)

## 2023-09-22 LAB — LIPASE, BLOOD: Lipase: 32 U/L (ref 11–51)

## 2023-09-22 LAB — SALICYLATE LEVEL: Salicylate Lvl: <7 mg/dL — ABNORMAL LOW (ref 7.0–30.0)

## 2023-09-22 LAB — ETHANOL: Alcohol, Ethyl (B): <15 mg/dL (ref <15)

## 2023-09-22 MED ORDER — DIAZEPAM 10 MG PO TABS: 10.0000 mg | ORAL_TABLET | Freq: Four times a day (QID) | ORAL | 0 refills | Status: DC | PRN

## 2023-09-22 MED ORDER — IOHEXOL 300 MG/ML  SOLN
100.0000 mL | Freq: Once | INTRAMUSCULAR | Status: AC | PRN
  Administered 2023-09-22: 100 mL via INTRAVENOUS

## 2023-09-22 MED ORDER — OXYCODONE HCL 5 MG PO TABS: 5.0000 mg | ORAL_TABLET | Freq: Once | ORAL | Status: DC

## 2023-09-22 MED ORDER — ONDANSETRON HCL 4 MG/2ML IJ SOLN
4.0000 mg | Freq: Once | INTRAMUSCULAR | Status: AC
  Administered 2023-09-22: 4 mg via INTRAVENOUS
  Filled 2023-09-22: qty 2

## 2023-09-22 MED ORDER — MORPHINE SULFATE (PF) 4 MG/ML IV SOLN
4.0000 mg | Freq: Once | INTRAVENOUS | Status: AC
  Administered 2023-09-22: 4 mg via INTRAVENOUS
  Filled 2023-09-22: qty 1

## 2023-09-22 NOTE — ED Notes (Signed)
 Pt. To BHU from ED ambulatory without difficulty, to room  bhu 7. Report from [facility]. Pt. Is alert and oriented, warm and dry in no distress. Pt. Denies SI, HI, and AVH. Pt. Calm and cooperative. Pt. Made aware of security cameras and Q15 minute rounds. Pt. Encouraged to let Nursing staff know of any concerns or needs.   ENVIRONMENTAL ASSESSMENT Potentially harmful objects out of patient reach: Yes.   Personal belongings secured: Yes.   Patient dressed in hospital provided attire only: Yes.   Plastic bags out of patient reach: Yes.   Patient care equipment (cords, cables, call bells, lines, and drains) shortened, removed, or accounted for: Yes.   Equipment and supplies removed from bottom of stretcher: Yes.   Potentially toxic materials out of patient reach: Yes.   Sharps container removed or out of patient reach: Yes.

## 2023-09-22 NOTE — ED Triage Notes (Signed)
 Pt presents to the ED via POV from home for SI. Pt reports having a hx of same. States that she has a Therapist, sports and takes medication for depression. Pt reports taking medications as prescribed.   Pt reports having a plan to kill herself, but states that she does not intend to carry out this plan.

## 2023-09-22 NOTE — ED Provider Notes (Signed)
 [facility] Provider Note    Event Date/Time   First MD Initiated Contact with Patient 09/22/23 1513     (approximate)   History   Chief Complaint Suicidal   HPI  Natalie Edwards is a 58 y.o. female with past medical history of hypertension, CKD, peptic ulcer disease, and iron  deficiency anemia who presents to the ED complaining of abdominal pain and suicidal ideation.  Patient reports that she has had about 24 hours of increasing pain in the left lower quadrant of her abdomen.  She describes the pain as sharp and associated with nausea, but she has not had any vomiting or changes in her bowel movements.  She reports history of PUD and diverticulitis, has not noticed any blood in her stool.  She denies any fevers, dysuria, or flank pain.  She does report feeling depressed recently, initially reported some suicidal ideation but now denies this and states there is no way she could go through with harming herself.      Physical Exam   Triage Vital Signs: ED Triage Vitals  Encounter Vitals Group     BP 09/22/23 1437 (!) 163/86     Girls Systolic BP Percentile --      Girls Diastolic BP Percentile --      Boys Systolic BP Percentile --      Boys Diastolic BP Percentile --      Pulse Rate 09/22/23 1437 66     Resp 09/22/23 1437 18     Temp 09/22/23 1437 97.6 F (36.4 C)     Temp Source 09/22/23 1437 Oral     SpO2 09/22/23 1437 99 %     Weight 09/22/23 1438 178 lb (80.7 kg)     Height 09/22/23 1438 5' 4 (1.626 m)     Head Circumference --      Peak Flow --      Pain Score 09/22/23 1437 0     Pain Loc --      Pain Education --      Exclude from Growth Chart --     Most recent vital signs: Vitals:   09/22/23 1437 09/22/23 1844  BP: (!) 163/86 (!) 148/57  Pulse: 66 (!) 58  Resp: 18 17  Temp: 97.6 F (36.4 C) 98.3 F (36.8 C)  SpO2: 99% 97%    Constitutional: Alert and oriented. Eyes: Conjunctivae are normal. Head: Atraumatic. Nose: No  congestion/rhinnorhea. Mouth/Throat: Mucous membranes are moist.  Cardiovascular: Normal rate, regular rhythm. Grossly normal heart sounds.  2+ radial pulses bilaterally. Respiratory: Normal respiratory effort.  No retractions. Lungs CTAB. Gastrointestinal: Soft and tender to palpation in the left lower quadrant with no rebound or guarding. No distention. Musculoskeletal: No lower extremity tenderness nor edema.  Neurologic:  Normal speech and language. No gross focal neurologic deficits are appreciated.    ED Results / Procedures / Treatments   Labs (all labs ordered are listed, but only abnormal results are displayed) Labs Reviewed  CBC WITH DIFFERENTIAL/PLATELET - Abnormal; Notable for the following components:      Result Value   WBC 15.6 (*)    RDW 18.0 (*)    Platelets 511 (*)    Neutro Abs 7.9 (*)    Lymphs Abs 6.4 (*)    Abs Immature Granulocytes 0.10 (*)    All other components within normal limits  SALICYLATE LEVEL - Abnormal; Notable for the following components:   Salicylate Lvl <7.0 (*)    All other components within  normal limits  COMPREHENSIVE METABOLIC PANEL WITH GFR - Abnormal; Notable for the following components:   Chloride 113 (*)    CO2 20 (*)    All other components within normal limits  ETHANOL  ACETAMINOPHEN  LEVEL  LIPASE, BLOOD  URINE DRUG SCREEN, QUALITATIVE ([facility] ONLY)  URINALYSIS, ROUTINE W REFLEX MICROSCOPIC     RADIOLOGY CT abdomen/pelvis reviewed and interpreted by me with no inflammatory changes, focal fluid collections, or dilated bowel loops.  PROCEDURES:  Critical Care performed: No  Procedures   MEDICATIONS ORDERED IN ED: Medications  morphine  (PF) 4 MG/ML injection 4 mg (4 mg Intravenous Given 09/22/23 1712)  ondansetron  (ZOFRAN ) injection 4 mg (4 mg Intravenous Given 09/22/23 1713)  iohexol  (OMNIPAQUE ) 300 MG/ML solution 100 mL (100 mLs Intravenous Contrast Given 09/22/23 1852)     IMPRESSION / MDM / ASSESSMENT AND PLAN / ED  COURSE  I reviewed the triage vital signs and the nursing notes.                              58 y.o. female with past medical history of hypertension, PUD, iron  deficiency anemia, and CKD who presents to the ED complaining of 24 hours of increasing left lower quadrant abdominal pain with depression.  Patient's presentation is most consistent with acute presentation with potential threat to life or bodily function.  Differential diagnosis includes, but is not limited to, diverticulitis, abscess, perforation, PUD, colitis, UTI, kidney stone, depression, anxiety, psychosis.  Patient nontoxic-appearing and in no acute distress, vital signs are unremarkable.  Her abdomen is soft but she does have tenderness to palpation in the left lower quadrant, will further assess with CT imaging.  Lab results are pending at this time, we will treat symptomatically with IV morphine  and Zofran , reassess following results.  Patient desires psychiatric evaluation as well but does not have any suicidal ideation and we will maintain voluntary status.  Labs with leukocytosis but no significant anemia, electrolyte abnormality, or AKI.  LFTs and lipase are unremarkable, CT of her abdomen/pelvis is negative for acute process.  Urinalysis pending at this time but patient denies any urinary symptoms and I have low suspicion for UTI.  She may be medically cleared for psychiatric disposition, psych eval pending at this time.  The patient has been placed in psychiatric observation due to the need to provide a safe environment for the patient while obtaining psychiatric consultation and evaluation, as well as ongoing medical and medication management to treat the patient's condition.  The patient has not been placed under full IVC at this time.       FINAL CLINICAL IMPRESSION(S) / ED DIAGNOSES   Final diagnoses:  Left lower quadrant abdominal pain  Depression, unspecified depression type     Rx / DC Orders   ED  Discharge Orders     None        Note:  This document was prepared using Dragon voice recognition software and may include unintentional dictation errors.   Willo Dunnings, MD 09/22/23 [phone]

## 2023-09-22 NOTE — ED Notes (Addendum)
 Pt's phone numbers  Arzella Carrier [phone]  [place] [phone]  Son 1 [phone]  Son 2 [phone]

## 2023-09-22 NOTE — ED Notes (Signed)
 VOL/  PENDING  CONSULT

## 2023-09-22 NOTE — Telephone Encounter (Signed)
 Patient is having MRI arthrogram left shoulder and needs medication for claustrophobia per Shamirah at [facility]. Please send to pharmacy.

## 2023-09-22 NOTE — BH Assessment (Signed)
 Telepsych consult requested via phone and secure chat.

## 2023-09-22 NOTE — BH Assessment (Signed)
 Comprehensive Clinical Assessment (CCA) Screening, Triage and Referral Note  09/22/2023 Natalie Edwards [id number] Recommendations for Services/Supports/Treatments: Disposition pending. Natalie Edwards is a 58 year old [place], English speaking female. Pt presented to [facility] ED voluntarily. Per triage note: Pt presents to the ED via POV from home for SI. Pt reports having a hx of same. States that she has a Therapist, sports and takes medication for depression. Pt reports taking medications as prescribed. Pt reports having a plan to kill herself, but states that she does not intend to carry out this plan. On assessment, the patient was visibly depressed. Pt was initially reticent when asked about her reasons for presenting to the hospital. With encouragement, pt began explaining that she has been having issues with feelings of sadness, and thoughts of intermittent and passive SI. Pt explained that she doesn't take care of herself, has low energy, experiences feelings of hopeless, anhedonia, and irritability. Pt reported that she has not been eating for the past 2 days due to nausea and that her sleep is poor. Pt reported that her main stressor is having a lack of transportation and not being able to come and go as she pleases. Pt reported that she has attempted suicide in the past by ingesting pills. Pt is connected to a psychiatrist and a therapist. Pt's speech was clear and coherent. Pt had linear thoughts. Pt presented with a depressed mood; affect was flat. The patient denied current SI/HI, and AV/H. Pt explained that one minute she's ok and the next minute she's not. Pt denies current substance use.  Chief Complaint:  Chief Complaint  Patient presents with   Suicidal   Visit Diagnosis: MDD (major depressive disorder), recurrent episode, severe (HCC) [F33.2]   Patient Reported Information How did you hear about us ? No data recorded What Is the Reason for Your Visit/Call Today? No data recorded How Long Has This  Been Causing You Problems? No data recorded What Do You Feel Would Help You the Most Today? No data recorded  Have You Recently Had Any Thoughts About Hurting Yourself? No data recorded Are You Planning to Commit Suicide/Harm Yourself At This time? No data recorded  Have you Recently Had Thoughts About Hurting Someone Sherral? No data recorded Are You Planning to Harm Someone at This Time? No data recorded Explanation: No data recorded  Have You Used Any Alcohol or Drugs in the Past 24 Hours? No data recorded How Long Ago Did You Use Drugs or Alcohol? No data recorded What Did You Use and How Much? No data recorded  Do You Currently Have a Therapist/Psychiatrist? No data recorded Name of Therapist/Psychiatrist: No data recorded  Have You Been Recently Discharged From Any Office Practice or Programs? No data recorded Explanation of Discharge From Practice/Program: No data recorded   CCA Screening Triage Referral Assessment Type of Contact: No data recorded Telemedicine Service Delivery:   Is this Initial or Reassessment?   Date Telepsych consult ordered in [facility]:    Time Telepsych consult ordered in [facility]:    Location of Assessment: No data recorded Provider Location: No data recorded   Collateral Involvement: No data recorded  Does Patient Have a Court Appointed Legal Guardian? No data recorded Name and Contact of Legal Guardian: No data recorded If Minor and Not Living with Parent(s), Who has Custody? No data recorded Is CPS involved or ever been involved? No data recorded Is APS involved or ever been involved? No data recorded  Patient Determined To Be At Risk for Harm To Self or  Others Based on Review of Patient Reported Information or Presenting Complaint? No data recorded Method: No data recorded Availability of Means: No data recorded Intent: No data recorded Notification Required: No data recorded Additional Information for Danger to Others Potential: No data  recorded Additional Comments for Danger to Others Potential: No data recorded Are There Guns or Other Weapons in Your Home? No data recorded Types of Guns/Weapons: No data recorded Are These Weapons Safely Secured?                            No data recorded Who Could Verify You Are Able To Have These Secured: No data recorded Do You Have any Outstanding Charges, Pending Court Dates, Parole/Probation? No data recorded Contacted To Inform of Risk of Harm To Self or Others: No data recorded  Does Patient Present under Involuntary Commitment? No data recorded   [place] of Residence: No data recorded  Patient Currently Receiving the Following Services: No data recorded  Determination of Need: No data recorded  Options For Referral: No data recorded  Disposition Recommendation per psychiatric provider: Pending psych consult.   Kordae Buonocore R Baruc Tugwell, LCAS

## 2023-09-22 NOTE — ED Notes (Addendum)
 Pt belongings  inventoried by this RN and Kaeley Vinje, NT and listed below: Black leggings Purple underwear Blue shirt Black sweater White bra Black sandals Phone

## 2023-09-23 DIAGNOSIS — F321 Major depressive disorder, single episode, moderate: Secondary | ICD-10-CM | POA: Diagnosis not present

## 2023-09-23 DIAGNOSIS — F1590 Other stimulant use, unspecified, uncomplicated: Secondary | ICD-10-CM | POA: Diagnosis not present

## 2023-09-23 DIAGNOSIS — F1090 Alcohol use, unspecified, uncomplicated: Secondary | ICD-10-CM

## 2023-09-23 DIAGNOSIS — R1032 Left lower quadrant pain: Secondary | ICD-10-CM | POA: Diagnosis not present

## 2023-09-23 MED ORDER — PROMETHAZINE HCL 25 MG PO TABS
25.0000 mg | ORAL_TABLET | Freq: Once | ORAL | Status: AC
  Administered 2023-09-23: 25 mg via ORAL
  Filled 2023-09-23 (×2): qty 1

## 2023-09-23 MED ORDER — LORAZEPAM 1 MG PO TABS
1.0000 mg | ORAL_TABLET | Freq: Once | ORAL | Status: AC
  Administered 2023-09-23: 1 mg via ORAL
  Filled 2023-09-23 (×2): qty 1

## 2023-09-23 NOTE — Consult Note (Signed)
 [facility] Psychiatric Consult Initial  Patient Name: .Natalie Edwards  MRN: [id number]  DOB: Nov 13, 1965  Consult Order details:  Orders (From admission, onward)     Start     Ordered   09/22/23 1651  IP CONSULT TO PSYCHIATRY       Ordering Provider: Willo Dunnings, MD  Provider:  (Not yet assigned)  Question Answer Comment  Place call to: [phone]   Reason for Consult Admit      09/22/23 1650   09/22/23 1651  CONSULT TO CALL ACT TEAM       Ordering Provider: Willo Dunnings, MD  Provider:  (Not yet assigned)  Question:  Reason for Consult?  Answer:  Depression, SI   09/22/23 1650             Mode of Visit: In person    Psychiatry Consult Evaluation  Service Date: September 23, 2023 LOS:  LOS: 0 days  Chief Complaint Depression/SI  Primary Psychiatric Diagnoses  MDD Moderate Alcohol use disorder Stimulant use disorder   Assessment  Natalie Edwards is a 58 y.o. female admitted: Presented to the [name] is a 58 year old [place], English speaking female. Pt presented to [name] ED voluntarily. Per triage note: Pt presents to the ED via POV from home for SI. Pt reports having a hx of same. States that she has a Therapist, sports and takes medication for depression. Pt reports taking medications as prescribed. Pt reports having a plan to kill herself, but states that she does not intend to carry out this plan.  Psychiatry is consulted for safety evaluation  Today on assessment patient denies SI/HI/plan and reports that she felt overwhelmed and she brought herself to the ED voluntarily.  Patient reports that she feels positive at this time and wants to follow-up with her outpatient psychiatrist.  She reports being compliant with medications.  She provided her cousin's number and on collateral there are no safety concerns by the family members.  They did request substance use resources for the patient and TTS is instructed to provide those resources on discharge to the patient.  Patient is not  meeting involuntary commitment criteria at that time as she is displaying safe behaviors, denies SI/HI and is willing to participate in outpatient mental health resources.     Diagnoses:  Active Hospital problems: Active Problems:   * No active hospital problems. *    Plan   ## Psychiatric Medication Recommendations:  Temazepam  Gabapentin  lyrica   ## Medical Decision Making Capacity: Not specifically addressed in this encounter  ## Further Work-up:   Mo additional work up required   ## Disposition:-- There are no psychiatric contraindications to discharge at this time  ## Behavioral / Environmental: -Utilize compassion and acknowledge the patient's experiences while setting clear and realistic expectations for care.    ## Safety and Observation Level:  - Based on my clinical evaluation, I estimate the patient to be at LOW risk of self harm in the current setting. - At this time, we recommend  routine. This decision is based on my review of the chart including patient's history and current presentation, interview of the patient, mental status examination, and consideration of suicide risk including evaluating suicidal ideation, plan, intent, suicidal or self-harm behaviors, risk factors, and protective factors. This judgment is based on our ability to directly address suicide risk, implement suicide prevention strategies, and develop a safety plan while the patient is in the clinical setting. Please contact our team if there is a concern that risk  level has changed.  CSSR Risk Category:C-SSRS RISK CATEGORY: High Risk  Suicide Risk Assessment: Patient has following modifiable risk factors for suicide: substance use which we are addressing by providing outpatient substance use resources. Patient has following non-modifiable or demographic risk factors for suicide: psychiatric hospitalization Patient has the following protective factors against suicide: Access to outpatient mental  health care, Supportive family, and Cultural, spiritual, or religious beliefs that discourage suicide  Thank you for this consult request. Recommendations have been communicated to the primary team.  We will sign off at this time.   [name], MD       History of Present Illness  Relevant Aspects of Hospital ED   Patient Report:  Patient reports that she lives by herself and she started having anxiety attacks at home since yesterday a.m. where she felt shortness of breath and crying spells.  She denies having any acute stressors in her life and reports that everything is actually going on very well.  She denies feeling depressed, denies feeling hopeless or worthless.  She reports that she had suicidal thoughts last week.  She denies any changes in her energy motivation and reports fair appetite and sleep.  She has ongoing anxiety and panic attacks and her medications helps her during the daytime.  She denies auditory/visual hallucinations and denies current or recent episodes of mania/hypomania.  She denies nightmares/flashbacks.    Collateral information:   TTS collateral information [name] who reports patient does not pose any danger to herself or others but she asked her to take her to the ED because she was not feeling like herself. She says patient has been mourning the death of her mom, whom she placed in a nursing facility where she passed away. She is blaming herself for the death. [name] states patient has Long history with substance use and wishes she would seek treatment. [name] reports she has not seen patient use any illegal substance but notices a change in her behavior.    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 Hx: Denies Living Situation: Currently homeless Spiritual Hx: None reported Access to weapons/lethal means: denies    Substance History Alcohol: 1 bottle of wine per night sometimes Type of alcohol wine Last Drink the week before hospitalization Number of drinks per day as above History of alcohol withdrawal seizures denies History of DT's denies Tobacco: 1 pack/day Illicit drugs: Cocaine intermittently, 1 week before hospitalization, a small bag Prescription drug abuse: Denies Rehab hx: Denies Exam Findings  Physical Exam: Reviewed and agree with the physical exam findings conducted by the ED provider Vital Signs:  Temp:  [98 F (36.7 C)-98.1 F (36.7 C)] 98 F (36.7 C) (07/09 1650) Pulse Rate:  [72-82] 72 (07/09 1650) Resp:  [16-18] 16 (07/09 1650) BP: (147-150)/(84-91) 150/84 (07/09 1650) SpO2:  [95 %-96 %] 96 % (07/09 1650) Blood pressure (!) 150/84, pulse 72, temperature 98 F (36.7 C), temperature source Oral, resp. rate 16, height 5' 4 (1.626 m), weight 80.7 kg, SpO2 96%. Body mass  index is 30.55 kg/m.    Mental Status Exam: General Appearance: Casual  Orientation:  Full (Time, Place, and Person)  Memory:  Immediate;   Fair Recent;   Fair Remote;   Fair  Concentration:  Concentration: Fair and Attention Span: Fair  Recall:  Fair  Attention  Fair  Eye Contact:  Fair  Speech:  Clear and Coherent  Language:  Good  Volume:  Normal  Mood: good  Affect:  Appropriate  Thought Process:  Coherent  Thought Content:  Logical  Suicidal Thoughts:  No  Homicidal Thoughts:  No  Judgement:  Fair  Insight:  Fair  Psychomotor Activity:  Normal  Akathisia:  No  Fund of Knowledge:   Good      Assets:  Communication Skills Desire for Improvement  Cognition:  WNL  ADL's:  Intact  AIMS (if indicated):        Other History   These have been pulled in through the EMR, reviewed, and updated if appropriate.  Family History:  The patient's family history includes Cancer in her maternal uncle, maternal uncle, and maternal uncle.  Medical History: Past Medical History:  Diagnosis Date   Allergy    Anemia    Diverticulosis    Gall bladder stones 1993   Gastric ulcer    GERD (gastroesophageal reflux disease)    Hypertension    Renal mass     Surgical History: Past Surgical History:  Procedure Laterality Date   ABDOMINAL HYSTERECTOMY     BIOPSY  09/29/2022   Procedure: BIOPSY;  Surgeon: Shila Gustav GAILS, MD;  Location: [facility] ENDOSCOPY;  Service: Gastroenterology;;   CHOLECYSTECTOMY     COLONOSCOPY WITH ESOPHAGOGASTRODUODENOSCOPY (EGD)  04/07/2023   [facility]   ESOPHAGOGASTRODUODENOSCOPY (EGD) WITH PROPOFOL  N/A 09/29/2022   Procedure: ESOPHAGOGASTRODUODENOSCOPY (EGD) WITH PROPOFOL ;  Surgeon: Shila Gustav GAILS, MD;  Location: [facility] ENDOSCOPY;  Service: Gastroenterology;  Laterality: N/A;     Medications:  No current facility-administered medications for this encounter.  Current Outpatient Medications:    busPIRone  (BUSPAR ) 7.5 MG tablet, Take 7.5 mg by mouth 2 (two) times daily., Disp: , Rfl:    diazepam  (VALIUM ) 10 MG tablet, Take 1 tablet (10 mg total) by mouth every 6 (six) hours as needed for anxiety., Disp: 1 tablet, Rfl: 0   FLUoxetine  (PROZAC ) 20 MG capsule, Take 20 mg by mouth daily., Disp: , Rfl:    propranolol  (INDERAL ) 10 MG tablet, Take 10 mg by mouth daily., Disp: , Rfl:    sucralfate  (CARAFATE ) 1 GM/10ML suspension, Take 1 g by mouth 4 (four) times daily -  with meals and at bedtime., Disp: , Rfl:    acetaminophen  (TYLENOL ) 500 MG tablet, Take 1 tablet (500 mg total) by mouth every 6 (six) hours as needed., Disp: 30 tablet, Rfl: 0    amLODipine  (NORVASC ) 10 MG tablet, Take 1 tablet (10 mg total) by mouth daily., Disp: 30 tablet, Rfl: 0   ciprofloxacin  (CIPRO ) 500 MG tablet, Take 1 tablet (500 mg total) by mouth 2 (two) times daily., Disp: 20 tablet, Rfl: 0   dicyclomine  (BENTYL ) 20 MG tablet, Take 1 tablet (20 mg total) by mouth 3 (three) times daily before meals., Disp: 30 tablet, Rfl: 0   FLUoxetine  (PROZAC ) 20 MG tablet, Take 20 mg by mouth daily., Disp: , Rfl:    gabapentin  (NEURONTIN ) 300 MG capsule, Take 1 capsule (300 mg total) by mouth 2 (two) times daily. (Patient taking differently: Take 300 mg by mouth 2 (two) times daily  as needed (for shoulder pain).), Disp: 60 capsule, Rfl: 1   hydrochlorothiazide  (HYDRODIURIL ) 25 MG tablet, Take 1 tablet (25 mg total) by mouth daily., Disp: 30 tablet, Rfl: 0   hydrOXYzine  (ATARAX ) 50 MG tablet, Take 50 mg by mouth 2 (two) times daily as needed for anxiety., Disp: , Rfl:    metroNIDAZOLE  (FLAGYL ) 500 MG tablet, Take 1 tablet (500 mg total) by mouth 3 (three) times daily., Disp: 30 tablet, Rfl: 0   mirtazapine  (REMERON ) 15 MG tablet, Take 15 mg by mouth at bedtime., Disp: , Rfl:    mirtazapine  (REMERON ) 30 MG tablet, Take 1 tablet (30 mg total) by mouth at bedtime. (Patient not taking: Reported on 08/30/2023), Disp: 30 tablet, Rfl: 0   naphazoline-glycerin  (CLEAR EYES REDNESS) 0.012-0.25 % SOLN, Place 1-2 drops into both eyes 4 (four) times daily as needed for eye irritation., Disp: 1 mL, Rfl: 0   nicotine  (NICODERM CQ  - DOSED IN MG/24 HOURS) 21 mg/24hr patch, Place 1 patch (21 mg total) onto the skin daily., Disp: 28 patch, Rfl: 0   ondansetron  (ZOFRAN ) 4 MG tablet, Take 1 tablet (4 mg total) by mouth every 8 (eight) hours as needed for nausea or vomiting., Disp: 20 tablet, Rfl: 0   oxyCODONE -acetaminophen  (PERCOCET/ROXICET) 5-325 MG tablet, Take 1 tablet by mouth 3 (three) times daily as needed (for pain)., Disp: , Rfl:    pantoprazole  (PROTONIX ) 40 MG tablet, Take 1 tablet (40 mg  total) by mouth 2 (two) times daily., Disp: 30 tablet, Rfl: 2   pregabalin  (LYRICA ) 75 MG capsule, Take 75 mg by mouth 2 (two) times daily., Disp: , Rfl:    promethazine  (PHENERGAN ) 12.5 MG tablet, Take 1 tablet (12.5 mg total) by mouth every 6 (six) hours as needed for nausea or vomiting., Disp: 10 tablet, Rfl: 0   risperiDONE  (RISPERDAL ) 1 MG tablet, Take 1 mg by mouth 2 (two) times daily., Disp: , Rfl:    traZODone  (DESYREL ) 100 MG tablet, Take 100 mg by mouth at bedtime., Disp: , Rfl:    traZODone  (DESYREL ) 50 MG tablet, Take 1 tablet (50 mg total) by mouth at bedtime., Disp: 30 tablet, Rfl: 0  Allergies: Allergies  Allergen Reactions   Hydroxyzine  Palpitations and Other (See Comments)    Jitteriness, too   Nsaids Other (See Comments)    History of ulcers   Ambien  [Zolpidem  Tartrate] Other (See Comments)    Jitteriness, nervousness, abdominal pain   Ibuprofen  Other (See Comments)    History of ulcers    Allyn Foil, MD

## 2023-09-23 NOTE — ED Notes (Signed)
 Per Dr. Donnelly, [facility] provided  resources for substance abuse intensive outpatient program with [facility] in [place].  Leonor, RN will provide resource to patient upon discharge.   [place], [facility] [phone]

## 2023-09-23 NOTE — ED Notes (Signed)
 VOL/pending psych consult

## 2023-09-23 NOTE — ED Provider Notes (Signed)
-----------------------------------------   4:17 PM on 09/23/2023 -----------------------------------------  I consulted and discussed the case with Dr. Gordy from psychiatry who has evaluated the patient and cleared her psychiatrically.  The patient has already been medically cleared.  She is stable for discharge at this time.  Return precautions have been provided.   Jacolyn Pae, MD 09/23/23 [phone]

## 2023-09-23 NOTE — Discharge Instructions (Addendum)
 Follow-up with your primary care provider and outpatient mental health provider.  Return to the ER for any new or worsening symptoms that concern you especially any thoughts of wanting to harm yourself or others.

## 2023-09-23 NOTE — ED Notes (Signed)
 Pt was provided supper tray.

## 2023-09-23 NOTE — ED Notes (Signed)
 Pt given snack and drink at this time. Pt is well appearing. Pt denies no other needs at this time.

## 2023-09-23 NOTE — ED Notes (Signed)
 Pt discharged at this time. RN reviewed discharge instructions with pt. Pt verbalized understanding. Vital signs taken. RR even and unlabored. Pt denies any questions or needs at this time. Pt ambulatory at discharge with all belongings. Pt cousin picking pt up and transporting home.

## 2023-09-23 NOTE — BH Assessment (Signed)
 Writer spoke to patient's cousin, [name] who reports patient does not pose any danger to herself or others but she asked her to take her to the ED because she was not feeling like herself. She says patient has been mourning the death of her mom, whom she placed in a nursing facility where she passed away. She is blaming herself for the death. [name] states patient has Long history with substance use and wishes she would seek treatment. [name] reports she has not seen patient use any illegal substance but notices a change in her behavior.

## 2023-09-23 NOTE — ED Notes (Addendum)
 The pt is awake and has requested some tissues.

## 2023-09-23 NOTE — ED Provider Notes (Signed)
 Emergency Medicine Observation Re-evaluation Note  Natalie Edwards is a 58 y.o. female, seen on rounds today.  Pt initially presented to the ED for complaints of Suicidal  Currently, the patient is resting in bed. No reported issues from nursing team.   Physical Exam  BP (!) 144/69 (BP Location: Left Arm)   Pulse (!) 58   Temp 97.9 F (36.6 C)   Resp 18   Ht 5' 4 (1.626 m)   Wt 80.7 kg   SpO2 98%   BMI 30.55 kg/m  Physical Exam General: Resting in bed  ED Course / MDM   CBC with mild leukocytosis at 15.6.  CMP without significant derangement.  Therapeutic Tylenol  level at 18, negative salicylate level.  Urine not suggestive of infection.  UDS positive for opiates and TCAs.  CT abdomen pelvis without acute findings.  Patient has been medically cleared.  Plan  Current plan is for dispo per psychiatry.    Levander Slate, MD 09/23/23 [phone]

## 2023-09-25 ENCOUNTER — Other Ambulatory Visit: Payer: Self-pay

## 2023-09-25 ENCOUNTER — Emergency Department (HOSPITAL_COMMUNITY)
Admission: EM | Admit: 2023-09-25 | Discharge: 2023-09-25 | Disposition: A | Discharge status: Home / Self Care | Attending: Emergency Medicine | Admitting: Emergency Medicine

## 2023-09-25 ENCOUNTER — Emergency Department (HOSPITAL_COMMUNITY)

## 2023-09-25 DIAGNOSIS — M25512 Pain in left shoulder: Secondary | ICD-10-CM | POA: Diagnosis present

## 2023-09-25 DIAGNOSIS — G8929 Other chronic pain: Secondary | ICD-10-CM | POA: Insufficient documentation

## 2023-09-25 DIAGNOSIS — Z79899 Other long term (current) drug therapy: Secondary | ICD-10-CM | POA: Insufficient documentation

## 2023-09-25 MED ORDER — METHOCARBAMOL 500 MG PO TABS
500.0000 mg | ORAL_TABLET | Freq: Once | ORAL | Status: AC
  Administered 2023-09-25: 500 mg via ORAL
  Filled 2023-09-25: qty 1

## 2023-09-25 MED ORDER — LIDOCAINE 5 % EX PTCH
1.0000 | MEDICATED_PATCH | CUTANEOUS | 0 refills | Status: DC
Start: 2023-09-25 — End: 2023-10-08

## 2023-09-25 MED ORDER — METHOCARBAMOL 500 MG PO TABS: 500.0000 mg | ORAL_TABLET | Freq: Two times a day (BID) | ORAL | 0 refills | Status: DC

## 2023-09-25 NOTE — Discharge Instructions (Addendum)
 I have written for muscle relaxers however do NOT take this medication (robaxin ) with the Valium  that was given to you by orthopedics.  Also use Lidoderm  patches.  Make sure to follow back up with orthopedics  Return for any worsening symptoms

## 2023-09-25 NOTE — ED Provider Notes (Signed)
 [place] EMERGENCY DEPARTMENT AT [facility] Provider Note   CSN: [id number] Arrival date & time: 09/25/23  1830    Patient presents with: Shoulder Pain   Natalie Edwards is a 58 y.o. female here for evaluation of left shoulder and arm pain.  Had an injury a few months ago.  Was seen initially by Ortho thought likely tendinitis and recommended PT possible MRI if symptoms not improved.  She states she continues to have pain.  Has not followed back up with Ortho.  States she has had some fullness and bulging at her biceps area since the injury.  No numbness or weakness.  Pain worse with movement.  No chest pain or shortness of breath.  No redness, warmth to the extremity.  No additional falls or injuries.  Taking OTC Tylenol  and Motrin  wo relief.   HPI     Prior to Admission medications   Medication Sig Start Date End Date Taking? Authorizing Provider  diazepam  (VALIUM ) 10 MG tablet Take 1 tablet (10 mg total) by mouth every 6 (six) hours as needed for anxiety. 09/22/23   Persons, Ronal Dragon, PA  acetaminophen  (TYLENOL ) 500 MG tablet Take 1 tablet (500 mg total) by mouth every 6 (six) hours as needed. 08/30/23   Charlyn Sora, MD  amLODipine  (NORVASC ) 10 MG tablet Take 1 tablet (10 mg total) by mouth daily. 06/11/23   Jadapalle, Sree, MD  busPIRone  (BUSPAR ) 7.5 MG tablet Take 7.5 mg by mouth 2 (two) times daily. 09/15/23   [provider]  ciprofloxacin  (CIPRO ) 500 MG tablet Take 1 tablet (500 mg total) by mouth 2 (two) times daily. 09/02/23   May, Deanna J, NP  dicyclomine  (BENTYL ) 20 MG tablet Take 1 tablet (20 mg total) by mouth 3 (three) times daily before meals. 09/09/23   Nivia Colon, PA-C  FLUoxetine  (PROZAC ) 20 MG capsule Take 20 mg by mouth daily. 09/15/23   [provider]  FLUoxetine  (PROZAC ) 20 MG tablet Take 20 mg by mouth daily.    [provider]  gabapentin  (NEURONTIN ) 300 MG capsule Take 1 capsule (300 mg total) by mouth 2 (two) times  daily. Patient taking differently: Take 300 mg by mouth 2 (two) times daily as needed (for shoulder pain). 08/06/23   Tanda Bleacher, MD  hydrochlorothiazide  (HYDRODIURIL ) 25 MG tablet Take 1 tablet (25 mg total) by mouth daily. 06/12/23   Donnelly Mellow, MD  hydrOXYzine  (ATARAX ) 50 MG tablet Take 50 mg by mouth 2 (two) times daily as needed for anxiety.    [provider]  lidocaine  (LIDODERM ) 5 % Place 1 patch onto the skin daily. Remove & Discard patch within 12 hours or as directed by MD 09/25/23  Yes Duanne Duchesne A, PA-C  methocarbamol  (ROBAXIN ) 500 MG tablet Take 1 tablet (500 mg total) by mouth 2 (two) times daily. 09/25/23  Yes Abbie Jablon A, PA-C  metroNIDAZOLE  (FLAGYL ) 500 MG tablet Take 1 tablet (500 mg total) by mouth 3 (three) times daily. 09/02/23   May, Deanna J, NP  mirtazapine  (REMERON ) 15 MG tablet Take 15 mg by mouth at bedtime.    [provider]  mirtazapine  (REMERON ) 30 MG tablet Take 1 tablet (30 mg total) by mouth at bedtime. Patient not taking: Reported on 08/30/2023 06/11/23   Jadapalle, Sree, MD  naphazoline-glycerin  (CLEAR EYES REDNESS) 0.012-0.25 % SOLN Place 1-2 drops into both eyes 4 (four) times daily as needed for eye irritation. 06/11/23   Jadapalle, Sree, MD  nicotine  (NICODERM CQ  -  DOSED IN MG/24 HOURS) 21 mg/24hr patch Place 1 patch (21 mg total) onto the skin daily. 06/12/23   Donnelly Mellow, MD  ondansetron  (ZOFRAN ) 4 MG tablet Take 1 tablet (4 mg total) by mouth every 8 (eight) hours as needed for nausea or vomiting. 08/06/23   Tanda Bleacher, MD  oxyCODONE -acetaminophen  (PERCOCET/ROXICET) 5-325 MG tablet Take 1 tablet by mouth 3 (three) times daily as needed (for pain).    [provider]  pantoprazole  (PROTONIX ) 40 MG tablet Take 1 tablet (40 mg total) by mouth 2 (two) times daily. 09/02/23   May, Deanna J, NP  pregabalin  (LYRICA ) 75 MG capsule Take 75 mg by mouth 2 (two) times daily.    [provider]  promethazine   (PHENERGAN ) 12.5 MG tablet Take 1 tablet (12.5 mg total) by mouth every 6 (six) hours as needed for nausea or vomiting. 07/28/23   Bernard Drivers, MD  propranolol  (INDERAL ) 10 MG tablet Take 10 mg by mouth daily. 09/15/23   [provider]  risperiDONE  (RISPERDAL ) 1 MG tablet Take 1 mg by mouth 2 (two) times daily.    [provider]  sucralfate  (CARAFATE ) 1 GM/10ML suspension Take 1 g by mouth 4 (four) times daily -  with meals and at bedtime. 09/10/23 09/09/24  [provider]  traZODone  (DESYREL ) 100 MG tablet Take 100 mg by mouth at bedtime.    [provider]  traZODone  (DESYREL ) 50 MG tablet Take 1 tablet (50 mg total) by mouth at bedtime. 06/11/23   Jadapalle, Sree, MD    Allergies: Hydroxyzine , Nsaids, Ambien  [zolpidem  tartrate], and Ibuprofen     Review of Systems  Constitutional: Negative.   HENT: Negative.    Respiratory: Negative.    Cardiovascular: Negative.   Gastrointestinal: Negative.   Genitourinary: Negative.   Musculoskeletal:        Left shoulder arm pain  Skin: Negative.   Neurological: Negative.   All other systems reviewed and are negative.   Updated Vital Signs BP (!) 137/95   Pulse 91   Temp 98.2 F (36.8 C) (Oral)   Resp 18   Ht 5' 4 (1.626 m)   Wt 80.7 kg   SpO2 100%   BMI 30.54 kg/m   Physical Exam Vitals and nursing note reviewed.  Constitutional:      General: She is not in acute distress.    Appearance: She is well-developed. She is not ill-appearing, toxic-appearing or diaphoretic.  HENT:     Head: Atraumatic.  Eyes:     Pupils: Pupils are equal, round, and reactive to light.  Cardiovascular:     Rate and Rhythm: Normal rate.     Pulses: Normal pulses.          Radial pulses are 2+ on the right side and 2+ on the left side.     Heart sounds: Normal heart sounds.  Pulmonary:     Effort: Pulmonary effort is normal. No respiratory distress.     Breath sounds: Normal breath sounds.  Abdominal:     General:  Bowel sounds are normal. There is no distension.     Palpations: Abdomen is soft.  Musculoskeletal:        General: Normal range of motion.     Cervical back: Normal range of motion.     Comments: Tenderness anterior left shoulder, proximal humerus with fullness at her proximal bicep.  No redness or warmth.  Limited range of motion overhead and back due to pain.  No bony tenderness to forearm,  midline neck or thoracic region.  Skin:    General: Skin is warm and dry.     Capillary Refill: Capillary refill takes less than 2 seconds.     Comments: No erythema, warmth, rashes or lesions  Neurological:     General: No focal deficit present.     Mental Status: She is alert and oriented to person, place, and time.     Comments: Equal handgrip bilaterally  Psychiatric:        Mood and Affect: Mood normal.     (all labs ordered are listed, but only abnormal results are displayed) Labs Reviewed - No data to display  EKG: None  Radiology: DG Shoulder Left Result Date: 09/25/2023 CLINICAL DATA:  Injury EXAM: LEFT SHOULDER - 2+ VIEW COMPARISON:  Left shoulder x-ray 08/18/2023 FINDINGS: There is no evidence of fracture or dislocation. There is no evidence of arthropathy or other focal bone abnormality. Soft tissues are unremarkable. IMPRESSION: Negative. Electronically Signed   By: Greig Pique M.D.   On: 09/25/2023 20:00   DG Humerus Left Result Date: 09/25/2023 CLINICAL DATA:  Injury EXAM: LEFT HUMERUS - 2+ VIEW COMPARISON:  None Available. FINDINGS: There is no evidence of fracture or other focal bone lesions. Soft tissues are unremarkable. IMPRESSION: Negative. Electronically Signed   By: Greig Pique M.D.   On: 09/25/2023 19:59     Procedures   Medications Ordered in the ED  methocarbamol  (ROBAXIN ) tablet 500 mg (500 mg Oral Given by Other 09/25/23 7096)   58 year old here for evaluation of persistent left shoulder proximal humerus pain after fall many months ago.  Initially seen by  Ortho without likely rotator cuff versus tendinitis.  Has not followed back up with them.  She continues to have pain.  She has a bulge at her proximal biceps insertion however this was also noted on her orthopedics note.  No redness or warmth.  No chest pain, midline back pain, numbness or weakness.  Pain worse with movement.  Suspect this is likely musculoskeletal in nature low suspicion for atypical cardiac etiology.  No fluctuance induration, erythema to suggest cellulitis, abscess.  No risk factors for DVT.  Will plan on x-ray, pain control  Imaging personally viewed interpreted X-ray left humerus/shoulder  Discussed results with patient at bedside.  Over some lidocaine  patches, muscle relaxers.  She will need to follow-up with orthopedics in the outpatient setting for continued evaluation.  I did review she was recently given Valium  to help.  Patient states this has not helped.  Will try switching over to Robaxin .  I discussed not taking both medications at the same time due to the sedating properties.  She will return for any worsening symptoms.  The patient has been appropriately medically screened and/or stabilized in the ED. I have low suspicion for any other emergent medical condition which would require further screening, evaluation or treatment in the ED or require inpatient management.  Low suspicion for occult fracture, infectious process, dislocation, VTE, ischemia, septic joint, gout, hemarthrosis, radiculopathy, atypical intrathoracic etiology such as ACS, PE.  Patient is hemodynamically stable and in no acute distress.  Patient able to ambulate in department prior to ED.  Evaluation does not show acute pathology that would require ongoing or additional emergent interventions while in the emergency department or further inpatient treatment.  I have discussed the diagnosis with the patient and answered all questions.  Pain is been managed while in the emergency department and patient  has no further complaints prior to  discharge.  Patient is comfortable with plan discussed in room and is stable for discharge at this time.  I have discussed strict return precautions for returning to the emergency department.  Patient was encouraged to follow-up with PCP/specialist refer to at discharge.                                  Medical Decision Making Amount and/or Complexity of Data Reviewed External Data Reviewed: labs, radiology and notes. Radiology: ordered and independent interpretation performed. Decision-making details documented in ED Course.  Risk OTC drugs. Prescription drug management. Decision regarding hospitalization. Diagnosis or treatment significantly limited by social determinants of health.        Final diagnoses:  Chronic left shoulder pain    ED Discharge Orders          Ordered    lidocaine  (LIDODERM ) 5 %  Every 24 hours        09/25/23 2028    methocarbamol  (ROBAXIN ) 500 MG tablet  2 times daily        09/25/23 2028               Jolissa Kapral A, PA-C 09/25/23 2030    Cottie Donnice PARAS, MD 09/25/23 2055

## 2023-09-25 NOTE — ED Triage Notes (Signed)
 Pt arrived reporting left shoulder pain from fall 1 month ago. States has been taking OTC meds, not helping. Denies any other symptoms.

## 2023-09-28 ENCOUNTER — Ambulatory Visit: Admitting: Physical Therapy

## 2023-09-28 ENCOUNTER — Telehealth: Payer: Self-pay | Admitting: Physical Therapy

## 2023-09-28 NOTE — Telephone Encounter (Signed)
 Contacted patient due to missed appointment. She reported that she forgot. She opted not to reschedule this week, and will keep he appointment for next week.

## 2023-10-05 ENCOUNTER — Ambulatory Visit: Admitting: Physical Therapy

## 2023-10-05 ENCOUNTER — Telehealth: Payer: Self-pay | Admitting: Physician Assistant

## 2023-10-05 NOTE — Telephone Encounter (Signed)
 Patient called and said she never called in her Pregablin. CB#[phone]

## 2023-10-06 ENCOUNTER — Ambulatory Visit
Admission: RE | Admit: 2023-10-06 | Discharge: 2023-10-06 | Disposition: A | Discharge status: Home / Self Care | Source: Ambulatory Visit | Attending: Physician Assistant | Admitting: Physician Assistant

## 2023-10-06 DIAGNOSIS — G8929 Other chronic pain: Secondary | ICD-10-CM

## 2023-10-06 MED ORDER — IOPAMIDOL (ISOVUE-M 200) INJECTION 41%
12.0000 mL | Freq: Once | INTRAMUSCULAR | Status: AC
  Administered 2023-10-06: 12 mL via INTRA_ARTICULAR

## 2023-10-07 ENCOUNTER — Other Ambulatory Visit: Payer: Self-pay

## 2023-10-07 ENCOUNTER — Ambulatory Visit: Payer: Self-pay

## 2023-10-07 ENCOUNTER — Encounter (HOSPITAL_COMMUNITY): Payer: Self-pay

## 2023-10-07 ENCOUNTER — Emergency Department (HOSPITAL_COMMUNITY)
Admission: EM | Admit: 2023-10-07 | Discharge: 2023-10-08 | Disposition: A | Discharge status: Home / Self Care | Attending: Emergency Medicine | Admitting: Emergency Medicine

## 2023-10-07 ENCOUNTER — Other Ambulatory Visit: Payer: Self-pay | Admitting: Family Medicine

## 2023-10-07 ENCOUNTER — Emergency Department (HOSPITAL_COMMUNITY)

## 2023-10-07 DIAGNOSIS — I129 Hypertensive chronic kidney disease with stage 1 through stage 4 chronic kidney disease, or unspecified chronic kidney disease: Secondary | ICD-10-CM | POA: Diagnosis not present

## 2023-10-07 DIAGNOSIS — Y900 Blood alcohol level of less than 20 mg/100 ml: Secondary | ICD-10-CM | POA: Diagnosis not present

## 2023-10-07 DIAGNOSIS — N189 Chronic kidney disease, unspecified: Secondary | ICD-10-CM | POA: Insufficient documentation

## 2023-10-07 DIAGNOSIS — D72829 Elevated white blood cell count, unspecified: Secondary | ICD-10-CM | POA: Diagnosis not present

## 2023-10-07 DIAGNOSIS — F1010 Alcohol abuse, uncomplicated: Secondary | ICD-10-CM

## 2023-10-07 DIAGNOSIS — D75839 Thrombocytosis, unspecified: Secondary | ICD-10-CM | POA: Diagnosis not present

## 2023-10-07 DIAGNOSIS — F411 Generalized anxiety disorder: Secondary | ICD-10-CM | POA: Insufficient documentation

## 2023-10-07 DIAGNOSIS — K5792 Diverticulitis of intestine, part unspecified, without perforation or abscess without bleeding: Secondary | ICD-10-CM | POA: Diagnosis present

## 2023-10-07 DIAGNOSIS — Z79899 Other long term (current) drug therapy: Secondary | ICD-10-CM | POA: Diagnosis not present

## 2023-10-07 DIAGNOSIS — R911 Solitary pulmonary nodule: Secondary | ICD-10-CM | POA: Diagnosis not present

## 2023-10-07 DIAGNOSIS — F1090 Alcohol use, unspecified, uncomplicated: Secondary | ICD-10-CM | POA: Diagnosis not present

## 2023-10-07 DIAGNOSIS — K5732 Diverticulitis of large intestine without perforation or abscess without bleeding: Secondary | ICD-10-CM | POA: Diagnosis not present

## 2023-10-07 DIAGNOSIS — F331 Major depressive disorder, recurrent, moderate: Secondary | ICD-10-CM | POA: Diagnosis not present

## 2023-10-07 DIAGNOSIS — R45851 Suicidal ideations: Secondary | ICD-10-CM | POA: Insufficient documentation

## 2023-10-07 DIAGNOSIS — R1032 Left lower quadrant pain: Secondary | ICD-10-CM | POA: Diagnosis present

## 2023-10-07 LAB — COMPREHENSIVE METABOLIC PANEL WITH GFR
ALT: 15 U/L (ref 0–44)
AST: 17 U/L (ref 15–41)
Albumin: 3.9 g/dL (ref 3.5–5.0)
Alkaline Phosphatase: 64 U/L (ref 38–126)
Anion gap: 11 (ref 5–15)
BUN: 13 mg/dL (ref 6–20)
CO2: 22 mmol/L (ref 22–32)
Calcium: 9.8 mg/dL (ref 8.9–10.3)
Chloride: 107 mmol/L (ref 98–111)
Creatinine, Ser: 1.01 mg/dL — ABNORMAL HIGH (ref 0.44–1.00)
GFR, Estimated: >60 mL/min (ref >60)
Glucose, Bld: 96 mg/dL (ref 70–99)
Potassium: 3.8 mmol/L (ref 3.5–5.1)
Sodium: 140 mmol/L (ref 135–145)
Total Bilirubin: 0.5 mg/dL (ref 0.0–1.2)
Total Protein: 7.8 g/dL (ref 6.5–8.1)

## 2023-10-07 LAB — CBC
HCT: 38.4 % (ref 36.0–46.0)
Hemoglobin: 12.3 g/dL (ref 12.0–15.0)
MCH: 26.3 pg (ref 26.0–34.0)
MCHC: 32 g/dL (ref 30.0–36.0)
MCV: 82.2 fL (ref 80.0–100.0)
Platelets: 669 K/uL — ABNORMAL HIGH (ref 150–400)
RBC: 4.67 MIL/uL (ref 3.87–5.11)
RDW: 17.2 % — ABNORMAL HIGH (ref 11.5–15.5)
WBC: 15.4 K/uL — ABNORMAL HIGH (ref 4.0–10.5)
nRBC: 0 % (ref 0.0–0.2)

## 2023-10-07 LAB — RAPID URINE DRUG SCREEN, HOSP PERFORMED
Amphetamines: NOT DETECTED
Barbiturates: NOT DETECTED
Benzodiazepines: POSITIVE — AB
Cocaine: NOT DETECTED
Opiates: NOT DETECTED
Tetrahydrocannabinol: NOT DETECTED

## 2023-10-07 LAB — ETHANOL: Alcohol, Ethyl (B): <15 mg/dL (ref <15)

## 2023-10-07 MED ORDER — IOHEXOL 350 MG/ML SOLN
75.0000 mL | Freq: Once | INTRAVENOUS | Status: AC | PRN
  Administered 2023-10-07: 75 mL via INTRAVENOUS

## 2023-10-07 MED ORDER — OXYCODONE-ACETAMINOPHEN 5-325 MG PO TABS
2.0000 | ORAL_TABLET | Freq: Four times a day (QID) | ORAL | Status: DC | PRN
  Administered 2023-10-07 – 2023-10-08 (×2): 2 via ORAL
  Filled 2023-10-07 (×2): qty 2

## 2023-10-07 MED ORDER — MORPHINE SULFATE (PF) 4 MG/ML IV SOLN
4.0000 mg | Freq: Once | INTRAVENOUS | Status: AC
  Administered 2023-10-07: 4 mg via INTRAVENOUS
  Filled 2023-10-07: qty 1

## 2023-10-07 MED ORDER — ACETAMINOPHEN 325 MG PO TABS
650.0000 mg | ORAL_TABLET | Freq: Four times a day (QID) | ORAL | Status: DC | PRN
  Administered 2023-10-08: 650 mg via ORAL
  Filled 2023-10-07: qty 2

## 2023-10-07 MED ORDER — AMOXICILLIN-POT CLAVULANATE 875-125 MG PO TABS
1.0000 | ORAL_TABLET | Freq: Two times a day (BID) | ORAL | Status: DC
  Administered 2023-10-07 – 2023-10-08 (×2): 1 via ORAL
  Filled 2023-10-07 (×2): qty 1

## 2023-10-07 MED ORDER — DIAZEPAM 5 MG/ML IJ SOLN
5.0000 mg | Freq: Once | INTRAMUSCULAR | Status: AC
  Administered 2023-10-07: 5 mg via INTRAVENOUS
  Filled 2023-10-07: qty 2

## 2023-10-07 MED ORDER — PIPERACILLIN-TAZOBACTAM 3.375 G IVPB 30 MIN
3.3750 g | Freq: Once | INTRAVENOUS | Status: AC
  Administered 2023-10-07: 3.375 g via INTRAVENOUS
  Filled 2023-10-07: qty 50

## 2023-10-07 NOTE — BH Assessment (Signed)
 Per Natalie HILARIO Carota, Natalie Edwards pt is medically cleared.    Jackson JONETTA Broach, MS, [facility], [facility] Triage Specialist [phone]

## 2023-10-07 NOTE — Telephone Encounter (Signed)
 Pt requesting refill on Lyrica .  FYI Only or Action Required?: Action required by provider: medication refill request.  Patient was last seen in primary care on 08/06/2023 by Tanda Bleacher, MD.  Called Nurse Triage reporting Shoulder Pain.  Symptoms began several months ago.  Interventions attempted: Nothing.  Symptoms are: gradually worsening.  Triage Disposition: See Physician Within 24 Hours  Patient/caregiver understands and will follow disposition?: No, wishes to speak with PCP Copied from CRM #[id number]. Topic: Clinical - Red Word Triage >> Oct 07, 2023  4:17 PM Tobias L wrote: Red Word that prompted transfer to Nurse Triage: left shoulder pain rates pain at a 10 Reason for Disposition  [1] Unable to use arm at all AND [2] because of shoulder pain or stiffness  Answer Assessment - Initial Assessment Questions 1. ONSET: When did the pain start?     yesterday 2. LOCATION: Where is the pain located?     L shoulder 3. PAIN: How bad is the pain? (Scale 1-10; or mild, moderate, severe)     10  Pt states that she is seeing ortho for her L shoulder. Pt states that her ortho dr told her that her PCP needs to rx her pain meds. Pt states she had a little procedure done yesterday and an MRI and Im in pain.  Protocols used: Shoulder Pain-A-AH

## 2023-10-07 NOTE — Discharge Instructions (Addendum)
 Once you are discharged from the hospital if you do not complete the entire 1 week course of Augmentin  prior to discharge please continue and complete the entire course.  Slowly advance from full liquid back to a normal diet, returning to liquids if you continue to have pain.  There were 2 incidental CT findings: 3. 1.9 cm low-attenuation left adrenal mass, likely consistent with  an adrenal adenoma.  4. 4 mm pleural based posterior left lower lobe pulmonary nodule.   Recommendation was made for a nonemergent CT to further evaluate the pulmonary nodule.  Please speak with your primary care doctor to schedule outpatient evaluation for these incidental findings after you are finished with your treatment in the emergency department.

## 2023-10-07 NOTE — BH Assessment (Signed)
 Per Sherlean HILARIO Carota, PA-C, pt is not medically cleared. PA to send message once pt is medically cleared.    Jackson JONETTA Broach, MS, [facility], [facility] Triage Specialist [phone]

## 2023-10-07 NOTE — ED Triage Notes (Signed)
 Pt bib in by officers states she is here for her anxiety. Pt wanted to take a bunch of her sleeping pills with the expectation of not waking up.  Pt states she has no intention of falling through with plan but desire to be dead.   Pt states she has a bad headache and stomach pain.   Pt states she isn't sure why she feels the way she do and says she has her son as support.

## 2023-10-07 NOTE — BH Assessment (Signed)
 Clinician spoke toIRIS to complete pt's TTS assessment. Clinician provided pt's name, MRN, location, age, room number and provider's name. Secure message completed.    Iris coordinator to update secure chat when assessment time and provider are assigned.  Jackson JONETTA Broach, MS, [facility], [facility] Triage Specialist [phone]

## 2023-10-07 NOTE — ED Provider Notes (Signed)
 [facility] AT [facility] Provider Note   CSN: [id number] Arrival date & time: 10/07/23  1933     Patient presents with: Anxiety and Suicidal   Natalie Edwards is a 58 y.o. female with past medical history significant for anxiety, CKD, cocaine abuse, depression, previous diverticulitis who presents with multiple problems today.  She was brought in by police officers for anxiety, she expressed some passive suicidal ideation reporting that she was going to take a bunch of sleeping pills and hope not to wake up, but she reported after speaking with them and with myself that she did not want to follow through with this plan.  She endorses some left lower quadrant abdominal pain similar to previous episode of diverticulitis.  She denies any diarrhea or rectal bleeding.  She denies any SI or HI at this time.    Anxiety       Prior to Admission medications   Medication Sig Start Date End Date Taking? Authorizing Provider  diazepam  (VALIUM ) 10 MG tablet Take 1 tablet (10 mg total) by mouth every 6 (six) hours as needed for anxiety. 09/22/23   Persons, Ronal Dragon, PA  acetaminophen  (TYLENOL ) 500 MG tablet Take 1 tablet (500 mg total) by mouth every 6 (six) hours as needed. 08/30/23   Charlyn Sora, MD  amLODipine  (NORVASC ) 10 MG tablet Take 1 tablet (10 mg total) by mouth daily. 06/11/23   Jadapalle, Sree, MD  busPIRone  (BUSPAR ) 7.5 MG tablet Take 7.5 mg by mouth 2 (two) times daily. 09/15/23   [provider]  ciprofloxacin  (CIPRO ) 500 MG tablet Take 1 tablet (500 mg total) by mouth 2 (two) times daily. 09/02/23   May, Deanna J, NP  dicyclomine  (BENTYL ) 20 MG tablet Take 1 tablet (20 mg total) by mouth 3 (three) times daily before meals. 09/09/23   Nivia Colon, PA-C  FLUoxetine  (PROZAC ) 20 MG capsule Take 20 mg by mouth daily. 09/15/23   [provider]  FLUoxetine  (PROZAC ) 20 MG tablet Take 20 mg by mouth daily.    [provider]  gabapentin   (NEURONTIN ) 300 MG capsule Take 1 capsule (300 mg total) by mouth 2 (two) times daily. Patient taking differently: Take 300 mg by mouth 2 (two) times daily as needed (for shoulder pain). 08/06/23   Tanda Bleacher, MD  hydrochlorothiazide  (HYDRODIURIL ) 25 MG tablet Take 1 tablet (25 mg total) by mouth daily. 06/12/23   Donnelly Mellow, MD  hydrOXYzine  (ATARAX ) 50 MG tablet Take 50 mg by mouth 2 (two) times daily as needed for anxiety.    [provider]  lidocaine  (LIDODERM ) 5 % Place 1 patch onto the skin daily. Remove & Discard patch within 12 hours or as directed by MD 09/25/23   Henderly, Britni A, PA-C  methocarbamol  (ROBAXIN ) 500 MG tablet Take 1 tablet (500 mg total) by mouth 2 (two) times daily. 09/25/23   Henderly, Britni A, PA-C  metroNIDAZOLE  (FLAGYL ) 500 MG tablet Take 1 tablet (500 mg total) by mouth 3 (three) times daily. 09/02/23   May, Deanna J, NP  mirtazapine  (REMERON ) 15 MG tablet Take 15 mg by mouth at bedtime.    [provider]  mirtazapine  (REMERON ) 30 MG tablet Take 1 tablet (30 mg total) by mouth at bedtime. Patient not taking: Reported on 08/30/2023 06/11/23   Jadapalle, Sree, MD  naphazoline-glycerin  (CLEAR EYES REDNESS) 0.012-0.25 % SOLN Place 1-2 drops into both eyes 4 (four) times daily as needed for eye irritation. 06/11/23   Jadapalle, Sree,  MD  nicotine  (NICODERM CQ  - DOSED IN MG/24 HOURS) 21 mg/24hr patch Place 1 patch (21 mg total) onto the skin daily. 06/12/23   Donnelly Mellow, MD  ondansetron  (ZOFRAN ) 4 MG tablet Take 1 tablet (4 mg total) by mouth every 8 (eight) hours as needed for nausea or vomiting. 08/06/23   Tanda Bleacher, MD  oxyCODONE -acetaminophen  (PERCOCET/ROXICET) 5-325 MG tablet Take 1 tablet by mouth 3 (three) times daily as needed (for pain).    [provider]  pantoprazole  (PROTONIX ) 40 MG tablet Take 1 tablet (40 mg total) by mouth 2 (two) times daily. 09/02/23   May, Deanna J, NP  pregabalin  (LYRICA ) 75 MG capsule Take 75 mg by  mouth 2 (two) times daily.    [provider]  promethazine  (PHENERGAN ) 12.5 MG tablet Take 1 tablet (12.5 mg total) by mouth every 6 (six) hours as needed for nausea or vomiting. 07/28/23   Bernard Drivers, MD  propranolol  (INDERAL ) 10 MG tablet Take 10 mg by mouth daily. 09/15/23   [provider]  risperiDONE  (RISPERDAL ) 1 MG tablet Take 1 mg by mouth 2 (two) times daily.    [provider]  sucralfate  (CARAFATE ) 1 GM/10ML suspension Take 1 g by mouth 4 (four) times daily -  with meals and at bedtime. 09/10/23 09/09/24  [provider]  traZODone  (DESYREL ) 100 MG tablet Take 100 mg by mouth at bedtime.    [provider]  traZODone  (DESYREL ) 50 MG tablet Take 1 tablet (50 mg total) by mouth at bedtime. 06/11/23   Jadapalle, Sree, MD    Allergies: Hydroxyzine , Nsaids, Ambien  [zolpidem  tartrate], and Ibuprofen     Review of Systems  All other systems reviewed and are negative.   Updated Vital Signs BP (!) 147/80 (BP Location: Left Arm)   Pulse 91   Temp 98.2 F (36.8 C) (Oral)   Resp 16   Ht 5' 4 (1.626 m)   Wt 80.7 kg   SpO2 100%   BMI 30.54 kg/m   Physical Exam Vitals and nursing note reviewed.  Constitutional:      General: She is not in acute distress.    Appearance: Normal appearance.  HENT:     Head: Normocephalic and atraumatic.  Eyes:     General:        Right eye: No discharge.        Left eye: No discharge.  Cardiovascular:     Rate and Rhythm: Normal rate and regular rhythm.     Heart sounds: No murmur heard.    No friction rub. No gallop.  Pulmonary:     Effort: Pulmonary effort is normal.     Breath sounds: Normal breath sounds.  Abdominal:     General: Bowel sounds are normal.     Palpations: Abdomen is soft.     Comments: Focal tenderness to palpation in the left lower quadrant, no rebound rigidity, guarding.  Skin:    General: Skin is warm and dry.     Capillary Refill: Capillary refill takes less than 2 seconds.   Neurological:     Mental Status: She is alert and oriented to person, place, and time.  Psychiatric:        Mood and Affect: Mood normal.        Behavior: Behavior normal.     (all labs ordered are listed, but only abnormal results are displayed) Labs Reviewed  COMPREHENSIVE METABOLIC PANEL WITH GFR - Abnormal; Notable for the following components:  Result Value   Creatinine, Ser 1.01 (*)    All other components within normal limits  CBC - Abnormal; Notable for the following components:   WBC 15.4 (*)    RDW 17.2 (*)    Platelets 669 (*)    All other components within normal limits  RAPID URINE DRUG SCREEN, HOSP PERFORMED - Abnormal; Notable for the following components:   Benzodiazepines POSITIVE (*)    All other components within normal limits  ETHANOL    EKG: EKG Interpretation Date/Time:  Wednesday October 07 2023 19:48:53 EDT Ventricular Rate:  88 PR Interval:  160 QRS Duration:  82 QT Interval:  356 QTC Calculation: 430 R Axis:   14  Text Interpretation: Normal sinus rhythm Possible Left atrial enlargement Possible Anterior infarct , age undetermined Abnormal ECG When compared with ECG of 28-May-2023 11:15, PREVIOUS ECG IS PRESENT Confirmed by Ula Barter [phone]) on 10/07/2023 8:46:30 PM  Radiology: CT ABDOMEN PELVIS W CONTRAST Result Date: 10/07/2023 CLINICAL DATA:  Abdominal pain. EXAM: CT ABDOMEN AND PELVIS WITH CONTRAST TECHNIQUE: Multidetector CT imaging of the abdomen and pelvis was performed using the standard protocol following bolus administration of intravenous contrast. RADIATION DOSE REDUCTION: This exam was performed according to the departmental dose-optimization program which includes automated exposure control, adjustment of the mA and/or kV according to patient size and/or use of iterative reconstruction technique. CONTRAST:  75mL OMNIPAQUE  IOHEXOL  350 MG/ML SOLN COMPARISON:  September 22, 2023 FINDINGS: Lower chest: A 4 mm pleural based posterior left lower  lobe pulmonary nodule is noted (axial CT image 1, CT series 4). Hepatobiliary: No focal liver abnormality is seen. Status post cholecystectomy. No biliary dilatation. Pancreas: Unremarkable. No pancreatic ductal dilatation or surrounding inflammatory changes. Spleen: Normal in size without focal abnormality. Adrenals/Urinary Tract: A 1.9 cm low-attenuation (approximately 37.25 Hounsfield units) left adrenal mass is seen. The right adrenal gland is unremarkable. Kidneys are normal in size, without renal calculi or hydronephrosis. Multiple stable bilateral simple renal cysts of various sizes are seen. Bladder is unremarkable. Stomach/Bowel: Stomach is within normal limits. Appendix appears normal. No evidence of bowel wall thickening, distention, or inflammatory changes. Noninflamed diverticula are seen throughout the descending and sigmoid colon. Mild to moderate severity thickening of a short segment of proximal to mid sigmoid colon is also seen. Vascular/Lymphatic: Aortic atherosclerosis. No enlarged abdominal or pelvic lymph nodes. Reproductive: Status post hysterectomy. No adnexal masses. Other: No abdominal wall hernia or abnormality. No abdominopelvic ascites. Musculoskeletal: No acute or significant osseous findings. IMPRESSION: 1. Mild to moderate severity sigmoid diverticulitis. 2. Stable bilateral simple renal cysts. No follow-up imaging is recommended. This recommendation follows ACR consensus guidelines: Management of the Incidental Renal Mass on CT: A White Paper of the ACR Incidental Findings Committee. J Am Coll Radiol 2018;15:264-273. 3. 1.9 cm low-attenuation left adrenal mass, likely consistent with an adrenal adenoma. 4. 4 mm pleural based posterior left lower lobe pulmonary nodule. Further evaluation with dedicated nonemergent chest CT is recommended. 5. Aortic atherosclerosis. Electronically Signed   By: Suzen Dials M.D.   On: 10/07/2023 22:26   DG FLUORO GUIDED NEEDLE PLC  ASPIRATION/INJECTION LOC Result Date: 10/06/2023 CLINICAL DATA:  Left shoulder pain. EXAM: LEFT SHOULDER INJECTION UNDER FLUOROSCOPY TECHNIQUE: An appropriate skin entrance site was determined. The site was marked, prepped with Betadine, draped in the usual sterile fashion, and infiltrated locally with 1% lidocaine . A 22 gauge spinal needle was advanced to the superomedial margin of the humeral head under intermittent fluoroscopy. 1 mL of 1% lidocaine   injected easily. A mixture of 0.1 mL of MultiHance, 15 mL of Isovue -M 200, and 5 mL of sterile saline was then used to opacify the left shoulder joint. 12 mL of this mixture were injected. There was no immediate complication. FLUOROSCOPY: Radiation Exposure Index (as provided by the fluoroscopic device): 0.20 mGy Kerma IMPRESSION: Technically successful left shoulder injection for MRI. Electronically Signed   By: Dasie Hamburg M.D.   On: 10/06/2023 15:49     Procedures   Medications Ordered in the ED  piperacillin -tazobactam (ZOSYN ) IVPB 3.375 g (has no administration in time range)  amoxicillin -clavulanate (AUGMENTIN ) 875-125 MG per tablet 1 tablet (has no administration in time range)  oxyCODONE -acetaminophen  (PERCOCET/ROXICET) 5-325 MG per tablet 2 tablet (has no administration in time range)  acetaminophen  (TYLENOL ) tablet 650 mg (has no administration in time range)  morphine  (PF) 4 MG/ML injection 4 mg (4 mg Intravenous Given 10/07/23 2136)  diazepam  (VALIUM ) injection 5 mg (5 mg Intravenous Given 10/07/23 2136)  iohexol  (OMNIPAQUE ) 350 MG/ML injection 75 mL (75 mLs Intravenous Contrast Given 10/07/23 2220)                                    Medical Decision Making Amount and/or Complexity of Data Reviewed Labs: ordered.   This patient is a 58 y.o. female  who presents to the ED for concern of multiple complaints today, not only with some left lower quadrant abdominal pain, history of diverticulitis but also some passive suicidal ideation, no  history of suicide attempts.   Differential diagnoses prior to evaluation: The emergent differential diagnosis includes, but is not limited to, acute psychiatric emergency, SI, HI, AVH, versus other passive suicidal ideation, exacerbation of depression, anxiety, from abdominal pain perspective considered all of the following but not limited to AAA, mesenteric ischemia, appendicitis, diverticulitis, DKA, gastritis, gastroenteritis, AMI, nephrolithiasis, pancreatitis, peritonitis, adrenal insufficiency,lead poisoning, iron  toxicity, intestinal ischemia, constipation, UTI,SBO/LBO, splenic rupture, biliary disease, IBD, IBS, PUD, or hepatitis -- higher consideration for diverticulitis based on her hx and the location of the pain. This is not an exhaustive differential.   Past Medical History / Co-morbidities / Social History: anxiety, CKD, cocaine abuse, depression, previous diverticulitis  Physical Exam: Physical exam performed. The pertinent findings include: Vital signs stable other than mild hypertension, blood pressure 147/80, she has focal tenderness in the left lower quadrant with no rebound, rigidity, guarding.  Lab Tests/Imaging studies: I personally interpreted labs/imaging and the pertinent results include: UDS is positive for benzos.  CMP overall unremarkable.  CBC notable for leukocytosis, white blood cells 15.4, platelets elevated at 669, likely acute phase reactant from her suspected diverticulitis.  Ethanol is negative.  CT abdomen pelvis with contrast shows 4 mm nonemergent pulmonary nodule with recommendation for outpatient CT evaluation, additionally adrenal adenoma 1.9 cm incidentally found, she has acute uncomplicated diverticulitis. I agree with the radiologist interpretation.  Cardiac monitoring: EKG obtained and interpreted by myself and attending physician which shows: Normal sinus rhythm, no significant change from recent baseline   Medications: I ordered medication including  Valium  for anxiety, morphine  for pain, 1 dose Zosyn  for diverticulitis, given that she is here for psychiatric complaint and requesting to speak to [facility] regarding inpatient admission for passive suicidal ideations I ordered her a 1 week course of Augmentin  as well as full liquid diet, if she is discharged from the hospital prior to completing this treatment she will need a prescription sent  for the remainder of the 1 week twice daily Augmentin  course.  She should follow-up closely with her primary care doctor.  I have reviewed the patients home medicines and have made adjustments as needed.   Disposition: After consideration of the diagnostic results and the patients response to treatment, I feel that it is medically cleared for TTS eval at this time.  Final diagnoses:  Diverticulitis  Pulmonary nodule  Suicidal ideations    ED Discharge Orders     None          Jennavieve Arrick H, PA-C 10/07/23 2259    Patt Alm Macho, MD 10/07/23 [phone]

## 2023-10-07 NOTE — BH Assessment (Signed)
 Clinician messaged Natalie Lacinda HERO, RN: Natalie Edwards. It's Natalie Edwards with TTS. Is the pt able to engage in the assessment, if so the pt will need to be placed in a private room. Is the pt under IVC? Also is the pt medically cleared?   Clinician awaiting response.    Natalie JONETTA Broach, MS, [facility], [facility] Triage Specialist [phone]

## 2023-10-08 ENCOUNTER — Encounter (HOSPITAL_COMMUNITY): Payer: Self-pay | Admitting: Psychiatry

## 2023-10-08 DIAGNOSIS — F1010 Alcohol abuse, uncomplicated: Secondary | ICD-10-CM

## 2023-10-08 DIAGNOSIS — F411 Generalized anxiety disorder: Secondary | ICD-10-CM

## 2023-10-08 DIAGNOSIS — R45851 Suicidal ideations: Secondary | ICD-10-CM

## 2023-10-08 DIAGNOSIS — F1090 Alcohol use, unspecified, uncomplicated: Secondary | ICD-10-CM

## 2023-10-08 DIAGNOSIS — K5732 Diverticulitis of large intestine without perforation or abscess without bleeding: Secondary | ICD-10-CM | POA: Diagnosis not present

## 2023-10-08 DIAGNOSIS — F331 Major depressive disorder, recurrent, moderate: Secondary | ICD-10-CM

## 2023-10-08 MED ORDER — DICYCLOMINE HCL 20 MG PO TABS: 20.0000 mg | ORAL_TABLET | Freq: Two times a day (BID) | ORAL | 0 refills | Status: DC

## 2023-10-08 MED ORDER — AMOXICILLIN-POT CLAVULANATE 875-125 MG PO TABS: 1.0000 | ORAL_TABLET | Freq: Two times a day (BID) | ORAL | 0 refills | Status: DC

## 2023-10-08 MED ORDER — OXYCODONE HCL 5 MG PO TABS: 5.0000 mg | ORAL_TABLET | Freq: Three times a day (TID) | ORAL | 0 refills | Status: DC | PRN

## 2023-10-08 NOTE — ED Notes (Signed)
 All belongings returned to pt. Pt signed confirming she received medications and belongings.

## 2023-10-08 NOTE — Consult Note (Signed)
 [facility] Consult Note  Patient Name: Natalie Edwards MRN: [id number] DOB: 12/27/1965 DATE OF Consult: 10/08/2023  PRIMARY PSYCHIATRIC DIAGNOSES  1.  MDD, recurrent, mod 2.  GAD 3.  Alcohol Use Disorder  RECOMMENDATIONS  Inpt psych admission recommended:    [] YES       [x]  NO   Pt stated I want to be honest, I am not suicidal but just really lonely, I just wanted someone to talk too, I feel better now; pt talks futuristically; we discussed volunteer opportunities, involvement in community events; Engineering geologist events, socialization on social media   Medication recommendations:  continue with home medications; doses currently unknown enc her to speak with her outpt provider about potential increase in her fluoxetine  and/or propranolol   Non-Medication recommendations: referral for community resources for socialization    Communication: Treatment team members (and family members if applicable) who were involved in treatment/care discussions and planning, and with whom we spoke or engaged with via secure text/chat, include the following: epic chat Nurse Luke, KEN Genta   I have discussed my assessment and treatment recommendations with the patient. Possible medication side effects/risks/benefits of current regimen.   Importance of medication adherence for medication to be beneficial.   Follow-Up Telepsychiatry C/L services:            []  We will continue to follow this patient with you.             [x]  Will sign off for now. Please re-consult our service as necessary.  Thank you for involving us  in the care of this patient. If you have any additional questions or concerns, please call [phone] and ask for me or the provider on-call.  TELEPSYCHIATRY ATTESTATION & CONSENT  As the provider for this telehealth consult, I attest that I verified the patient's identity using two separate identifiers, introduced myself to the patient, provided my credentials, disclosed my location, and  performed this encounter via a HIPAA-compliant, real-time, face-to-face, two-way, interactive audio and video platform and with the full consent and agreement of the patient (or guardian as applicable.)  Patient physical location: [facility] ED. Telehealth provider physical location: home office in state of [place]  Video start time: 23:37pm (Central Time) Video end time: 00:19 am (Central Time)  IDENTIFYING DATA  Natalie Edwards is a 58 y.o. year-old female for whom a psychiatric consultation has been ordered by the primary provider. The patient was identified using two separate identifiers.  CHIEF COMPLAINT/REASON FOR CONSULT  I am having issues with my anxiety is acting up, I think it is coming from being lonesome  HISTORY OF PRESENT ILLNESS (HPI)  The patient presents to ED with abdominal pain, reported suicidal thoughts of overdose but would never do it    Hx of treatment for  MDD, Alcohol and Stimulant use disorder    Currently prescribed: Risperidone   Propranolol   Fluoxetine  Mirtazapine  Reports compliance, denied side effects  I sit in house everyday doing nothing, to be honest I just wanted to be around some people, I feel lonely, I would never kill myself, I just wanted to talk with someone  She reports having a doctor appt this morning she would like to attend, request discharge, reports ongoing mild depression and anxiety, increased tearfulness with medical issues, has anergia, denied  anhedonia, amotivation, just too hot and have no car   no reported panic symptoms, no reported obsessive/compulsive behaviors. Client denies active SI/HI ideations, plans or intent. There is no evidence of psychosis or delusional thinking.  Client denied past episodes of hypomania, hyperactivity, erratic/excessive spending, involvement in dangerous activities, self-inflated ego, grandiosity, or promiscuity.  sleeping 5-6 hrs/24hrs, appetite fluctuates, concentration off the hook some days good,  some days not good No self-harm behaviors. Reviewed active medication list/reviewed labs. Obtained Collateral information from medical record.  EKG QtC 430   PAST PSYCHIATRIC HISTORY    Previous Psychiatric Hospitalizations: once 15 yrs ago Previous Detox/Residential treatments:denied Outpt treatment:  [facility];  last seen 2 weeks ago  Previous psychotropic medication trials: buspirone , propranolol  diazepam  zolpidem  hydroxyzine   Previous mental health diagnosis per client/MEDICAL RECORD NUMBERMDD, Alcohol use disorder, stimulant use disorder  Suicide attempts/self-injurious behaviors:  once 15 yrs ago   History of trauma/abuse/neglect/exploitation:  denied  PAST MEDICAL HISTORY  Past Medical History:  Diagnosis Date   Allergy    Anemia    Diverticulosis    Gall bladder stones 1993   Gastric ulcer    GERD (gastroesophageal reflux disease)    Hypertension    Renal mass      HOME MEDICATIONS   Risperidone   Propranolol   Fluoxetine  Mirtazapine  Doses unknown at this time    ALLERGIES  Allergies  Allergen Reactions   Hydroxyzine  Palpitations and Other (See Comments)    Jitteriness, too   Nsaids Other (See Comments)    History of ulcers   Ambien  [Zolpidem  Tartrate] Other (See Comments)    Jitteriness, nervousness, abdominal pain   Ibuprofen  Other (See Comments)    History of ulcers    SOCIAL & SUBSTANCE USE HISTORY    Living Situation: alone  Separated;  3 adult children                   SSDI:  hx of housing at a retirement community Denied current legal issues.   Have you used/abused any of the following (include frequency/amt/last use):   ETOH   last drink not sure; may be minimizing her use as reprots  drinking once month; note on 09/20/23 indicated a bottle wine/day  Denied illicit drug hx, noted hx of crack  use   Tobacco 1ppd  UDS positive for: benzodiazepine BAL <15        FAMILY HISTORY  Family History  Problem Relation Age of Onset   Cancer  Maternal Uncle        Lung   Cancer Maternal Uncle        Lung   Cancer Maternal Uncle        Lung   Headache Neg Hx        I don't think so   Migraines Neg Hx        I don't think so   Family Psychiatric History (if known):  denied psychiatric illnesses substance use or suicides   MENTAL STATUS EXAM (MSE)  Mental Status Exam: General Appearance: Fairly Groomed  Orientation:  Full (Time, Place, and Person)  Memory:  Immediate;   Fair Recent;   Fair Remote;   Fair  Concentration:  Concentration: Fair  Recall:  Good  Attention  Fair  Eye Contact:  Good  Speech:  Clear and Coherent  Language:  Negative  Volume:  Normal  Mood: anxious  Affect:  Appropriate  Thought Process:  Coherent  Thought Content:  Rumination  Suicidal Thoughts:  No  Homicidal Thoughts:  No  Judgement:  Intact  Insight:  Fair  Psychomotor Activity:  Normal  Akathisia:  Negative  Fund of Knowledge:  Good    Assets:  Architect Housing  Cognition:  WNL  ADL's:  Intact  AIMS (if indicated):       VITALS  Blood pressure (!) 156/87, pulse 76, temperature 98.2 F (36.8 C), temperature source Oral, resp. rate 20, height 5' 4 (1.626 m), weight 80.7 kg, SpO2 99%.  LABS  Admission on 10/07/2023  Component Date Value Ref Range Status   Sodium 10/07/2023 140  135 - 145 mmol/L Final   Potassium 10/07/2023 3.8  3.5 - 5.1 mmol/L Final   Chloride 10/07/2023 107  98 - 111 mmol/L Final   CO2 10/07/2023 22  22 - 32 mmol/L Final   Glucose, Bld 10/07/2023 96  70 - 99 mg/dL Final   Glucose reference range applies only to samples taken after fasting for at least 8 hours.   BUN 10/07/2023 13  6 - 20 mg/dL Final   Creatinine, Ser 10/07/2023 1.01 (H)  0.44 - 1.00 mg/dL Final   Calcium  10/07/2023 9.8  8.9 - 10.3 mg/dL Final   Total Protein 92/76/7974 7.8  6.5 - 8.1 g/dL Final   Albumin 92/76/7974 3.9  3.5 - 5.0 g/dL Final   AST 92/76/7974 17  15 - 41 U/L Final   ALT  10/07/2023 15  0 - 44 U/L Final   Alkaline Phosphatase 10/07/2023 64  38 - 126 U/L Final   Total Bilirubin 10/07/2023 0.5  0.0 - 1.2 mg/dL Final   GFR, Estimated 10/07/2023 >60  >60 mL/min Final   Comment: (NOTE) Calculated using the CKD-EPI Creatinine Equation (2021)    Anion gap 10/07/2023 11  5 - 15 Final   Performed at [facility], [address], [place], [place] [address]   Alcohol, Ethyl (B) 10/07/2023 <15  <15 mg/dL Final   Comment: (NOTE) For medical purposes only. Performed at [facility], [address], [place], [place] [address]    WBC 10/07/2023 15.4 (H)  4.0 - 10.5 K/uL Final   RBC 10/07/2023 4.67  3.87 - 5.11 MIL/uL Final   Hemoglobin 10/07/2023 12.3  12.0 - 15.0 g/dL Final   HCT 92/76/7974 38.4  36.0 - 46.0 % Final   MCV 10/07/2023 82.2  80.0 - 100.0 fL Final   MCH 10/07/2023 26.3  26.0 - 34.0 pg Final   MCHC 10/07/2023 32.0  30.0 - 36.0 g/dL Final   RDW 92/76/7974 17.2 (H)  11.5 - 15.5 % Final   Platelets 10/07/2023 669 (H)  150 - 400 K/uL Final   nRBC 10/07/2023 0.0  0.0 - 0.2 % Final   Performed at [facility], [address], [place], [place] [address]   Opiates 10/07/2023 NONE DETECTED  NONE DETECTED Final   Cocaine 10/07/2023 NONE DETECTED  NONE DETECTED Final   Benzodiazepines 10/07/2023 POSITIVE (A)  NONE DETECTED Final   Amphetamines 10/07/2023 NONE DETECTED  NONE DETECTED Final   Tetrahydrocannabinol 10/07/2023 NONE DETECTED  NONE DETECTED Final   Barbiturates 10/07/2023 NONE DETECTED  NONE DETECTED Final   Comment: (NOTE) DRUG SCREEN FOR MEDICAL PURPOSES ONLY.  IF CONFIRMATION IS NEEDED FOR ANY PURPOSE, NOTIFY LAB WITHIN 5 DAYS.  LOWEST DETECTABLE LIMITS FOR URINE DRUG SCREEN Drug Class                     Cutoff (ng/mL) Amphetamine and metabolites    1000 Barbiturate and metabolites    200 Benzodiazepine                 200 Opiates and metabolites  300 Cocaine and metabolites        300 THC                             50 Performed at [facility], [address], [place], [place] [address]     PSYCHIATRIC REVIEW OF SYSTEMS (ROS)  Depression:      []  Denies all symptoms of depression [x] Depressed mood       [x] Insomnia/hypersomnia              [x] Fatigue        [x] Change in appetite     [] Anhedonia                                [x] Difficulty concentrating      [] Hopelessness             [] Worthlessness [x] Guilt/shame                [] Psychomotor agitation/retardation   Mania:     [x] Denies all symptoms of mania [] Elevated mood           [] Irritability         [] Pressured speech         []  Grandiosity         []  Decreased need for sleep                                                 [] Increased energy          []  Increase in goal directed activity                                       [] Flight of ideas    []  Excessive involvement in high-risk behaviors                   []  Distractibility     Psychosis:     [x] Denies all symptoms of psychosis [] Paranoia         []  Auditory Hallucinations          [] Visual hallucinations         [] ELOC        [] IOR                [] Delusions   Suicide:    []  Denies SI/plan/intent [x]  Passive SI         []   Active SI         [] Plan           [] Intent   Homicide:  [x]   Denies HI/plan/intent []  Passive HI         []  Active HI         [] Plan            [] Intent           [] Identified Target    Additional findings:      Musculoskeletal: No abnormal movements observed      Gait & Station: Laying/Sitting      Pain Screening: Present - mild to moderate      Nutrition & Dental Concerns: none reported  RISK FORMULATION/ASSESSMENT  [place]-Suicide Severity  Rating Scale (C-SSRS)  1) Have you wished you were dead or wished you could go to sleep and not wake up? No 2) Have you actually had any thoughts about killing yourself? Yes 3) Have you been thinking about how you might do this? No  4) Have you had these thoughts and had some intention of acting on them? No   5) Have you started to work out or worked out the details of how to kill yourself? Did you intend to carry out this plan? No 6) Have you done anything, started to do anything, or prepared to do anything to end your life? No   Is the patient experiencing any suicidal or homicidal ideations:          [x] NO        Protective factors considered for safety management:   Absence of psychosis Access to adequate health care Advice& help seeking Resourcefulness/Survival skills Children Positive therapeutic relationship Future oriented Suicide Inquiry:  Denies suicidal ideations, intentions, or plans.  Denies  recent self-harm behavior. Talks futuristically.  Risk factors/concerns considered for safety management:  [x] Prior attempt                                      [] Hopelessness  [] Family history of suicide                    [] Impulsivity [x] Depression                                         [] Aggression [x] Substance abuse/dependence          [x] Isolation [x] Physical illness/chronic pain              [] Barriers to accessing treatment [] Recent loss                                        [] Unwillingness to seek help [x] Access to lethal means                      [] Female gender [] Age over 40                                        [] Unmarried   Is there a safety management plan with the patient and treatment team to minimize risk factors and promote protective factors:     [x] YES          []  NO            Explain:   Safety Plan: How do you know when a safety plan should be used? I constantly just cry all day long, feel depressed What can you on your own when you have SI thoughts/urges to keep safe? Call someone, one my girlfriends Who can you reach out to or where can you go to take your mind off stress for a while? One of my girlfriends, talk a walk Who can you reach out to when you are in crisis? Girlfriends the only person I can trust What health care providers should you include  in your safety plan as someone to reach out to in  crisis? My primary care doctor, Dr. Tanda  Discussed crisis line 1 What lethal means do you have access to and how can we limit your access? No guns, denied stash of pills; just won't do it [employer]; 2009)  Is crisis care placement or psychiatric hospitalization recommended:  [] YES    [x] NO  Based on my current evaluation and risk assessment, patient is determined at this time to be at:Moderate Risk  Global Suicide Risk Assessment: The Patient is found to be at  Moderate risk of suicide or violence; however, risk lethality increased under context of drugs/alcohol. Encouraged to abstain  *RISK ASSESSMENT Risk assessment is a dynamic process; it is possible that this patient's condition, and risk level, may change. This should be re-evaluated and managed over time as appropriate. Please re-consult psychiatric consult services if additional assistance is needed in terms of risk assessment and management. If your team decides to discharge this patient, please advise the patient how to best access emergency psychiatric services, or to call 911, if their condition worsens or they feel unsafe in any way.    Total time spent in this encounter was 60 minutes with greater than 50% of time spent in counseling and coordination of care.     Dr. Marvetta JUDITHANN Ada, PhD, MSN, APRN, PMHNP-BC, [facility] Zaylie Gisler  KANDICE Ada, NP Telepsychiatry Consult Services

## 2023-10-08 NOTE — ED Provider Notes (Signed)
 Pt boarding pending TTS TTS has eval pt, see their note ([facility]), pt psych cleared Pt was medically cleared by team yestd Will plan for discharge Plan for augmentin  for home for her diverticulitis F/u gi  Patient in no distress and overall condition is stable. Detailed discussions were had with the patient/guardian regarding current findings, and need for close f/u with PCP or on call doctor. The patient/guardian has been instructed to return immediately if the symptoms worsen in any way for re-evaluation. Patient/guardian verbalized understanding and is in agreement with current care plan. All questions answered prior to discharge.      Elnor Jayson LABOR, DO 10/08/23 1126

## 2023-10-09 ENCOUNTER — Ambulatory Visit: Admitting: Physical Therapy

## 2023-10-13 ENCOUNTER — Ambulatory Visit

## 2023-10-13 DIAGNOSIS — M25512 Pain in left shoulder: Secondary | ICD-10-CM | POA: Diagnosis not present

## 2023-10-13 DIAGNOSIS — R293 Abnormal posture: Secondary | ICD-10-CM

## 2023-10-13 DIAGNOSIS — M6281 Muscle weakness (generalized): Secondary | ICD-10-CM

## 2023-10-13 NOTE — Therapy (Addendum)
 OUTPATIENT PHYSICAL THERAPY TREATMENT NOTE/DISCHARGE  PHYSICAL THERAPY DISCHARGE SUMMARY  Visits from Start of Care: 3  Current functional level related to goals / functional outcomes: See goals/objective   Remaining deficits: Unable to assess   Education / Equipment: HEP   Patient agrees to discharge. Patient goals were unable to assess. Patient is being discharged due to not returning since the last visit.     Patient Name: Natalie Edwards MRN: [id number] DOB:[birth year], 58 y.o., female Today's Date: 10/13/2023  END OF SESSION:  PT End of Session - 10/13/23 1013     Visit Number 3    Number of Visits 17    Date for PT Re-Evaluation 11/11/23    Authorization Type [employer] Dual Complete    PT Start Time 1015           Past Medical History:  Diagnosis Date   Allergy    Anemia    Diverticulosis    Gall bladder stones 1993   Gastric ulcer    GERD (gastroesophageal reflux disease)    Hypertension    Renal mass    Past Surgical History:  Procedure Laterality Date   ABDOMINAL HYSTERECTOMY     BIOPSY  09/29/2022   Procedure: BIOPSY;  Surgeon: Shila Gustav GAILS, MD;  Location: [facility];  Service: Gastroenterology;;   CHOLECYSTECTOMY     COLONOSCOPY WITH ESOPHAGOGASTRODUODENOSCOPY (EGD)  04/07/2023   [facility]   ESOPHAGOGASTRODUODENOSCOPY (EGD) WITH PROPOFOL  N/A 09/29/2022   Procedure: ESOPHAGOGASTRODUODENOSCOPY (EGD) WITH PROPOFOL ;  Surgeon: Shila Gustav GAILS, MD;  Location: [facility];  Service: Gastroenterology;  Laterality: N/A;   Patient Active Problem List   Diagnosis Date Noted   MDD (major depressive disorder), recurrent episode, moderate (HCC) 10/08/2023   Alcohol abuse 10/08/2023   Suicidal ideations 10/08/2023   Pain in left shoulder 09/17/2023   Pain and swelling of lower extremity 05/28/2023   MDD (major depressive disorder), recurrent episode, severe (HCC) 05/26/2023   Ingestion of substance, intentional self-harm, sequela (HCC) 05/23/2023    SIRS (systemic inflammatory response syndrome) (HCC) 05/23/2023   Rhabdomyolysis 05/23/2023   Cocaine abuse (HCC) 05/23/2023   Intractable nausea and vomiting 01/18/2023   MDD (major depressive disorder) 12/23/2022   MDD (major depressive disorder), recurrent severe, without psychosis (HCC) 12/22/2022   Grief 12/22/2022   Diverticulitis 10/17/2022   Melena 09/29/2022   Gastric ulcer without hemorrhage or perforation 09/29/2022   AKI (acute kidney injury) (HCC) 09/28/2022   Metabolic acidosis 09/28/2022   Epigastric pain 09/28/2022   Nausea 09/28/2022   Hyperlipidemia 12/14/2021   Prediabetes 12/14/2021   Anxiety and depression 05/14/2021   Breathing difficult 05/14/2021   GERD (gastroesophageal reflux disease)    Possible exposure to STD 01/14/2021   Chronic insomnia 10/18/2019   CKD (chronic kidney disease) 04/07/2019   GAD (generalized anxiety disorder) 05/07/2018   Chronic gastric ulcer 05/07/2018   Essential hypertension 05/07/2018   Iron  deficiency anemia due to chronic blood loss 09/23/2016   Peptic ulcer disease with hemorrhage 09/23/2016   Thrombocytosis 09/09/2016   Leukocytosis/thrombocytosis 09/09/2016   Gastric nodule 09/09/2016   H/O ulcer disease 07/31/2016    PCP: Tanda Bleacher, MD  REFERRING PROVIDER: Persons, Ronal Dragon, PA  REFERRING DIAG: [phone] (ICD-10-CM) - Chronic left shoulder pain   THERAPY DIAG:  Left shoulder pain, unspecified chronicity  Muscle weakness (generalized)  Abnormal posture  Rationale for Evaluation and Treatment: Rehabilitation  ONSET DATE: Chronic  SUBJECTIVE:  SUBJECTIVE STATEMENT: Pt presents to PT with continued reports of severe L shoulder pain. Medication does help, feels her GI symptoms have somewhat gotten better.   EVAL: Pt  presents to PT with reports of acute on chronic L shoulder pain stemming from fall during syncopal episode in March of this year. Pain refers into bicep, has N/T down L UE all the way to hand. Denies change in grip strength or quality, ADLs are very limited secondary to limited L shoulder ROM.  Hand dominance: Right  PERTINENT HISTORY: HTN  PAIN:  Are you having pain?  Yes: NPRS scale: 8/10 Worst: 10/10 Pain location: L anterior shoulder, L bicep Pain description: sharp, sore, N/T Aggravating factors: OH movement, lifting, reaching Relieving factors: rest, ice  PRECAUTIONS: None  RED FLAGS: None   WEIGHT BEARING RESTRICTIONS: No  FALLS:  Has patient fallen in last 6 months? Yes. Number of falls - one fall in March due to other comorbid conditions  LIVING ENVIRONMENT: Lives with: lives alone Lives in: House/apartment  OCCUPATION: Not currently working  PLOF: Independent  PATIENT GOALS: decrease L shoulder pain, be able to reach overhead easier, reach behind back  NEXT MD VISIT: 09/17/2023  OBJECTIVE:  Note: Objective measures were completed at Evaluation unless otherwise noted.  DIAGNOSTIC FINDINGS:  See imaging   PATIENT SURVEYS:  Quick DASH: 60% disability  COGNITION: Overall cognitive status: Within functional limits for tasks assessed     SENSATION: Light touch: Impaired - LUE  POSTURE: Rounded shoulders, fwd head  UPPER EXTREMITY ROM:   Active ROM Right eval Left eval Left  09/21/23  Shoulder flexion WFL 54 85 Arom  Shoulder extension     Shoulder abduction WFL 50   Shoulder adduction     Shoulder internal rotation WFL    Shoulder external rotation WFL 22 50 Prom  Elbow flexion     Elbow extension     Wrist flexion     Wrist extension     Wrist ulnar deviation     Wrist radial deviation     Wrist pronation     Wrist supination     (Blank rows = not tested)  UPPER EXTREMITY MMT:  MMT Right eval Left eval  Shoulder flexion     Shoulder extension    Shoulder abduction    Shoulder adduction    Shoulder internal rotation    Shoulder external rotation 4/5 2+/5  Middle trapezius    Lower trapezius    Elbow flexion    Elbow extension    Wrist flexion    Wrist extension    Wrist ulnar deviation    Wrist radial deviation    Wrist pronation    Wrist supination    Grip strength (lbs)    (Blank rows = not tested)  JOINT MOBILITY TESTING:  L GH hypomobility  PALPATION:  TTP to L infraspinatus, L upper trap   TREATMENT: [facility] Adult PT Treatment:                                                DATE: 10/13/23 Therapeutic Exercise: Supine horizontal abd 2x10 RTB Passive shoulder flexion and ER L Standing row 2x10 GTB Standing ext 2x10 RTB L IR/ER isometric x 10 - 5 hold Therapeutic Activity: Supine shoulder 2x10 L Supine dow flexion 2x10  Supine dow chest press x 5 - increased pain  Pulley's flexion x 90  UE ranger flexion x 10 34 L Modalities: MHP to L shoulder in supine x 10 min post session  [facility] Adult PT Treatment:                                                DATE: 09/21/23 Therapeutic Exercise: Passive shoulder flexion and ER , left ER iso 3 sec x 10 IR iso 3 sec x 10 Seated scap retract x 10  Therapeutic Activity: Supine clasped hand chest press  Supine AAROM with Dowel  Seated table slides for flexion 115 degrees  Seated table slides , scaption, low tolerance for small ROM Modalities: Cold pack x 8 minutes concurrent with self care Self Care: Cold pack applications frequently to reduce and manage pain. Pt with low tolerance to ice. Will try heat at next appt.    [facility] Adult PT Treatment:                                                DATE: 09/17/2023 Therapeutic Exercise: R shoulder IR/ER isometric x 5 - 5 hold L Seated scapular retraction x 5 Seated shoulder table slide x 5 flex L  PATIENT EDUCATION: Education details: eval findings, Quick DASH, HEP, POC Person educated:  Patient Education method: Explanation, Demonstration, and Handouts Education comprehension: verbalized understanding and returned demonstration  HOME EXERCISE PROGRAM: Access Code: [email or web address] URL: [email or web address] Date: 10/13/2023 Prepared by: Alm Kingdom  Exercises - Standing Isometric Shoulder External Rotation with Doorway and Towel Roll  - 1 x daily - 7 x weekly - 2 sets - 10 reps - 5 sec hold - Standing Isometric Shoulder Internal Rotation with Towel Roll at Doorway  - 1 x daily - 7 x weekly - 2 sets - 10 reps - 5 sec hold - Seated Scapular Retraction  - 1 x daily - 7 x weekly - 3 sets - 10 reps - 3 sec hold - Seated Shoulder Flexion Towel Slide at Table Top  - 1 x daily - 7 x weekly - 2 sets - 10 reps - 5 sec hold - Supine Shoulder Flexion Extension Full Range AROM  - 1 x daily - 7 x weekly - 2 sets - 10 reps  ASSESSMENT:  CLINICAL IMPRESSION: Pt was able to complete prescribed exercises but was limited by L shoulder pain with flexion. Exercises focused on improving L shoulder functional ability and strength of RTC and periscapular muscles in order to decrease pain. HEP updated for active flexion in supine. Continues to benefit from skilled PT, will progress as able.   EVAL: Patient is a 58 y.o. F who was seen today for physical therapy evaluation and treatment for acute on chronic severe L shoulder pain. Physical findings are consistent with referring provider impression as pt demonstrates severe limitations in L shoulder ROM and ER strength. Quick DASH shows severe disability in performance of home ADLs and community activities. Pt would benefit from skilled PT services working on improving periscapular strength and shoulder ROM in order to improve comfort and function.   OBJECTIVE IMPAIRMENTS: decreased activity tolerance, decreased mobility, decreased ROM, decreased strength, impaired UE functional use, postural dysfunction, and pain  ACTIVITY LIMITATIONS:  carrying, lifting, bathing, toileting, dressing, reach  over head, and caring for others  PARTICIPATION LIMITATIONS: meal prep, cleaning, driving, shopping, community activity, occupation, and yard work  PERSONAL FACTORS: Time since onset of injury/illness/exacerbation and 1 comorbidity: HTN are also affecting patient's functional outcome.   REHAB POTENTIAL: Good  CLINICAL DECISION MAKING: Stable/uncomplicated  EVALUATION COMPLEXITY: Low   GOALS: Goals reviewed with patient? No  SHORT TERM GOALS: Target date: 10/07/2023   Pt will be compliant and knowledgeable with initial HEP for improved comfort and carryover Baseline: initial HEP given  Goal status: MET  2.  Pt will self report left shoulder pain no greater than 7/10 for improved comfort and functional ability Baseline: 10/10 at worst 10/13/2023: 10/10 at worst Goal status: IN PROGRESS   LONG TERM GOALS: Target date: 11/11/2023   Pt will decrease Quick DASH disability score to no greater than 45% as proxy for functional improvement with home ADLs and community activities Baseline: 60% disability  Goal status: INITIAL  2.  Pt will self report left shoulder pain no greater than 3/10 for improved comfort and functional ability Baseline: 10/10 at worst Goal status: INITIAL   3.  Pt will improve L shoulder flexion to at least 120 degrees with no greater than 3/10 pain for improved OH reaching and functional ADL performance Baseline: 54 degrees Goal status: INITIAL  4.  Pt will improve L ER strength to at least 3+/5 for improved dynamic stability with OH reaching and improved function Baseline: 2+/5 Goal status: INITIAL   PLAN:  PT FREQUENCY: 1-2x/week  PT DURATION: 8 weeks  PLANNED INTERVENTIONS: 97164- PT Re-evaluation, 97110-Therapeutic exercises, 97530- Therapeutic activity, [id number]- Neuromuscular re-education, 97535- Self Care, 02859- Manual therapy, G0283- Electrical stimulation (unattended), [id number]- Electrical  stimulation (manual), 97016- Vasopneumatic device, 20560 (1-2 muscles), 20561 (3+ muscles)- Dry Needling, Cryotherapy, and Moist heat  PLAN FOR NEXT SESSION: assess HEP response, periscapular and ER strengthening, manual therapy   Alm JAYSON Kingdom PT  10/13/23 10:42 AM

## 2023-10-15 ENCOUNTER — Other Ambulatory Visit (HOSPITAL_BASED_OUTPATIENT_CLINIC_OR_DEPARTMENT_OTHER): Payer: Self-pay | Admitting: Physician Assistant

## 2023-10-15 DIAGNOSIS — G8929 Other chronic pain: Secondary | ICD-10-CM

## 2023-10-20 ENCOUNTER — Encounter (HOSPITAL_COMMUNITY): Payer: Self-pay

## 2023-10-20 ENCOUNTER — Emergency Department (HOSPITAL_COMMUNITY)

## 2023-10-20 ENCOUNTER — Other Ambulatory Visit: Payer: Self-pay

## 2023-10-20 ENCOUNTER — Emergency Department (HOSPITAL_COMMUNITY)
Admission: EM | Admit: 2023-10-20 | Discharge: 2023-10-21 | Disposition: A | Discharge status: Home / Self Care | Source: Home / Self Care | Attending: Emergency Medicine | Admitting: Emergency Medicine

## 2023-10-20 DIAGNOSIS — K5730 Diverticulosis of large intestine without perforation or abscess without bleeding: Secondary | ICD-10-CM | POA: Insufficient documentation

## 2023-10-20 DIAGNOSIS — R109 Unspecified abdominal pain: Secondary | ICD-10-CM | POA: Diagnosis not present

## 2023-10-20 DIAGNOSIS — K5792 Diverticulitis of intestine, part unspecified, without perforation or abscess without bleeding: Secondary | ICD-10-CM

## 2023-10-20 DIAGNOSIS — D72829 Elevated white blood cell count, unspecified: Secondary | ICD-10-CM | POA: Insufficient documentation

## 2023-10-20 DIAGNOSIS — K5732 Diverticulitis of large intestine without perforation or abscess without bleeding: Secondary | ICD-10-CM | POA: Diagnosis not present

## 2023-10-20 LAB — CBC WITH DIFFERENTIAL/PLATELET
Abs Immature Granulocytes: 0.11 K/uL — ABNORMAL HIGH (ref 0.00–0.07)
Basophils Absolute: 0 K/uL (ref 0.0–0.1)
Basophils Relative: 0 %
Eosinophils Absolute: 0.1 K/uL (ref 0.0–0.5)
Eosinophils Relative: 1 %
HCT: 34.7 % — ABNORMAL LOW (ref 36.0–46.0)
Hemoglobin: 11.3 g/dL — ABNORMAL LOW (ref 12.0–15.0)
Immature Granulocytes: 1 %
Lymphocytes Relative: 19 %
Lymphs Abs: 3.4 K/uL (ref 0.7–4.0)
MCH: 26.6 pg (ref 26.0–34.0)
MCHC: 32.6 g/dL (ref 30.0–36.0)
MCV: 81.6 fL (ref 80.0–100.0)
Monocytes Absolute: 1.3 K/uL — ABNORMAL HIGH (ref 0.1–1.0)
Monocytes Relative: 7 %
Neutro Abs: 12.8 K/uL — ABNORMAL HIGH (ref 1.7–7.7)
Neutrophils Relative %: 72 %
Platelets: 492 K/uL — ABNORMAL HIGH (ref 150–400)
RBC: 4.25 MIL/uL (ref 3.87–5.11)
RDW: 16.9 % — ABNORMAL HIGH (ref 11.5–15.5)
WBC: 17.8 K/uL — ABNORMAL HIGH (ref 4.0–10.5)
nRBC: 0 % (ref 0.0–0.2)

## 2023-10-20 LAB — LIPASE, BLOOD: Lipase: 35 U/L (ref 11–51)

## 2023-10-20 LAB — URINALYSIS, ROUTINE W REFLEX MICROSCOPIC
Bilirubin Urine: NEGATIVE
Glucose, UA: NEGATIVE mg/dL
Hgb urine dipstick: NEGATIVE
Ketones, ur: NEGATIVE mg/dL
Leukocytes,Ua: NEGATIVE
Nitrite: NEGATIVE
Protein, ur: NEGATIVE mg/dL
Specific Gravity, Urine: 1.014 (ref 1.005–1.030)
pH: 6 (ref 5.0–8.0)

## 2023-10-20 LAB — COMPREHENSIVE METABOLIC PANEL WITH GFR
ALT: 32 U/L (ref 0–44)
AST: 28 U/L (ref 15–41)
Albumin: 3.4 g/dL — ABNORMAL LOW (ref 3.5–5.0)
Alkaline Phosphatase: 79 U/L (ref 38–126)
Anion gap: 14 (ref 5–15)
BUN: 15 mg/dL (ref 6–20)
CO2: 24 mmol/L (ref 22–32)
Calcium: 9.1 mg/dL (ref 8.9–10.3)
Chloride: 102 mmol/L (ref 98–111)
Creatinine, Ser: 1.03 mg/dL — ABNORMAL HIGH (ref 0.44–1.00)
GFR, Estimated: >60 mL/min (ref >60)
Glucose, Bld: 97 mg/dL (ref 70–99)
Potassium: 4.4 mmol/L (ref 3.5–5.1)
Sodium: 140 mmol/L (ref 135–145)
Total Bilirubin: 0.4 mg/dL (ref 0.0–1.2)
Total Protein: 7.4 g/dL (ref 6.5–8.1)

## 2023-10-20 MED ORDER — ONDANSETRON HCL 4 MG/2ML IJ SOLN
4.0000 mg | Freq: Once | INTRAMUSCULAR | Status: AC
  Administered 2023-10-20: 4 mg via INTRAVENOUS
  Filled 2023-10-20: qty 2

## 2023-10-20 MED ORDER — MORPHINE SULFATE (PF) 4 MG/ML IV SOLN
4.0000 mg | Freq: Once | INTRAVENOUS | Status: AC
  Administered 2023-10-20: 4 mg via INTRAVENOUS
  Filled 2023-10-20: qty 1

## 2023-10-20 MED ORDER — FAMOTIDINE IN NACL 20-0.9 MG/50ML-% IV SOLN
20.0000 mg | Freq: Once | INTRAVENOUS | Status: AC
  Administered 2023-10-20: 20 mg via INTRAVENOUS
  Filled 2023-10-20: qty 50

## 2023-10-20 MED ORDER — IOHEXOL 300 MG/ML  SOLN
100.0000 mL | Freq: Once | INTRAMUSCULAR | Status: AC | PRN
  Administered 2023-10-20: 100 mL via INTRAVENOUS

## 2023-10-20 NOTE — ED Notes (Signed)
 Patient transported to CT

## 2023-10-20 NOTE — ED Provider Notes (Signed)
 [place] EMERGENCY DEPARTMENT AT [facility] Provider Note   CSN: [id number] Arrival date & time: 10/20/23  8161     Patient presents with: Abdominal Pain (Left side)   Natalie Edwards is a 58 y.o. female here with abdominal pain.  Hx of diverticulitis, last seen on CT 2 weeks ago and treated with Augmentin .  She reports pain got a little better but now it's back.  Nauseated. Burning pain in LUQ and LLQ abdomen with some blood in stools, small amount.  She has an appointment with general surgeon in 2 days due to recurring bouts of diverticulitis.  External records reviewed: endoscopy with Dr Avram Jan 2025 with inflamed gastric mucosa and colonoscopy same day showing multiple diverticula, otherwise normal imaging.   HPI     Prior to Admission medications   Medication Sig Start Date End Date Taking? Authorizing Provider  acetaminophen  (TYLENOL ) 500 MG tablet Take 1 tablet (500 mg total) by mouth every 6 (six) hours as needed. 08/30/23   Nanavati, Ankit, MD  amoxicillin -clavulanate (AUGMENTIN ) 875-125 MG tablet Take 1 tablet by mouth every 12 (twelve) hours. 10/08/23   Elnor Jayson LABOR, DO  busPIRone  (BUSPAR ) 7.5 MG tablet Take 7.5 mg by mouth 2 (two) times daily. 09/15/23   [provider]  dicyclomine  (BENTYL ) 20 MG tablet Take 1 tablet (20 mg total) by mouth 2 (two) times daily. 10/08/23   Elnor Jayson LABOR, DO  FLUoxetine  (PROZAC ) 20 MG capsule Take 20 mg by mouth daily. 09/15/23   [provider]  gabapentin  (NEURONTIN ) 300 MG capsule Take 1 capsule (300 mg total) by mouth 2 (two) times daily. Patient taking differently: Take 300 mg by mouth 2 (two) times daily as needed (for shoulder pain). 08/06/23   Tanda Bleacher, MD  mirtazapine  (REMERON ) 45 MG tablet Take 45 mg by mouth at bedtime. 09/25/23   [provider]  naphazoline-glycerin  (CLEAR EYES REDNESS) 0.012-0.25 % SOLN Place 1-2 drops into both eyes 4 (four) times daily as needed for eye irritation.  06/11/23   Jadapalle, Sree, MD  nicotine  (NICODERM CQ  - DOSED IN MG/24 HOURS) 21 mg/24hr patch Place 1 patch (21 mg total) onto the skin daily. 06/12/23   Donnelly Mellow, MD  oxyCODONE  (ROXICODONE ) 5 MG immediate release tablet Take 1 tablet (5 mg total) by mouth every 8 (eight) hours as needed for severe pain (pain score 7-10). 10/08/23   Elnor Jayson LABOR, DO  pantoprazole  (PROTONIX ) 40 MG tablet Take 1 tablet (40 mg total) by mouth 2 (two) times daily. 09/02/23   May, Deanna J, NP  pregabalin  (LYRICA ) 75 MG capsule Take 75 mg by mouth 2 (two) times daily.    [provider]  risperiDONE  (RISPERDAL ) 1 MG tablet Take 1 mg by mouth 2 (two) times daily.    [provider]  temazepam  (RESTORIL ) 15 MG capsule Take 15 mg by mouth at bedtime as needed. 09/29/23   [provider]    Allergies: Hydroxyzine , Nsaids, Ambien  [zolpidem  tartrate], and Ibuprofen     Review of Systems  Updated Vital Signs BP (!) 160/90   Pulse 73   Temp 98.1 F (36.7 C)   Resp 17   Ht 5' 4 (1.626 m)   Wt 81.6 kg   SpO2 94%   BMI 30.90 kg/m   Physical Exam Constitutional:      General: She is not in acute distress. HENT:     Head: Normocephalic and atraumatic.  Eyes:     Conjunctiva/sclera: Conjunctivae normal.  Pupils: Pupils are equal, round, and reactive to light.  Cardiovascular:     Rate and Rhythm: Normal rate and regular rhythm.  Pulmonary:     Effort: Pulmonary effort is normal. No respiratory distress.  Abdominal:     General: There is no distension.     Tenderness: There is abdominal tenderness in the left upper quadrant and left lower quadrant.  Skin:    General: Skin is warm and dry.  Neurological:     General: No focal deficit present.     Mental Status: She is alert. Mental status is at baseline.  Psychiatric:        Mood and Affect: Mood normal.        Behavior: Behavior normal.     (all labs ordered are listed, but only abnormal results are displayed) Labs  Reviewed  CBC WITH DIFFERENTIAL/PLATELET - Abnormal; Notable for the following components:      Result Value   WBC 17.8 (*)    Hemoglobin 11.3 (*)    HCT 34.7 (*)    RDW 16.9 (*)    Platelets 492 (*)    Neutro Abs 12.8 (*)    Monocytes Absolute 1.3 (*)    Abs Immature Granulocytes 0.11 (*)    All other components within normal limits  COMPREHENSIVE METABOLIC PANEL WITH GFR - Abnormal; Notable for the following components:   Creatinine, Ser 1.03 (*)    Albumin 3.4 (*)    All other components within normal limits  LIPASE, BLOOD  URINALYSIS, ROUTINE W REFLEX MICROSCOPIC    EKG: None  Radiology: No results found.   Procedures   Medications Ordered in the ED  morphine  (PF) 4 MG/ML injection 4 mg (4 mg Intravenous Given 10/20/23 1932)  ondansetron  (ZOFRAN ) injection 4 mg (4 mg Intravenous Given 10/20/23 1931)  famotidine  (PEPCID ) IVPB 20 mg premix (0 mg Intravenous Stopped 10/20/23 2004)  iohexol  (OMNIPAQUE ) 300 MG/ML solution 100 mL (100 mLs Intravenous Contrast Given 10/20/23 2216)    Clinical Course as of 10/20/23 2330  Tue Oct 20, 2023  2008 With worsening abd pain and elevated WBC, we'll repeat CT imaging to evaluate for possible GI perforation from recent diverticulitis [MT]  2329 Pt signed out to Dr Lum Passe EDP pending CT scan and reassessment [MT]    Clinical Course User Index [MT] Cottie Donnice PARAS, MD                                 Medical Decision Making Amount and/or Complexity of Data Reviewed Labs: ordered. Radiology: ordered.  Risk Prescription drug management.   This patient presents to the ED with concern for abdominal pain. This involves an extensive number of treatment options, and is a complaint that carries with it a high risk of complications and morbidity.  The differential diagnosis includes diverticulitis vs abscess vs perforation vs other  I ordered and personally interpreted labs.  The pertinent results include:  WBC elevation 17.8  I  ordered imaging studies including CT abdomen pelvis, pending at signout  I ordered medication including IV pain medication  I have reviewed the patients home medicines and have made adjustments as needed  Clinical Course as of 10/20/23 2330  Tue Oct 20, 2023  2008 With worsening abd pain and elevated WBC, we'll repeat CT imaging to evaluate for possible GI perforation from recent diverticulitis [MT]  2329 Pt signed out to Dr Lum Passe EDP pending CT  scan and reassessment [MT]    Clinical Course User Index [MT] Cottie Donnice PARAS, MD       Final diagnoses:  None    ED Discharge Orders     None          Cottie Donnice PARAS, MD 10/20/23 2330

## 2023-10-20 NOTE — ED Triage Notes (Addendum)
 Pt BIBA from home, c/o Luq and Llq abdominal pain. Hx of diverticulitis. Nausea and vomiting started today also had blood in her stool today. VSS.

## 2023-10-21 MED ORDER — METRONIDAZOLE 500 MG PO TABS
500.0000 mg | ORAL_TABLET | Freq: Once | ORAL | Status: AC
  Administered 2023-10-21: 500 mg via ORAL
  Filled 2023-10-21: qty 1

## 2023-10-21 MED ORDER — CIPROFLOXACIN HCL 500 MG PO TABS: 500.0000 mg | ORAL_TABLET | Freq: Two times a day (BID) | ORAL | 0 refills | Status: DC

## 2023-10-21 MED ORDER — HYDROCODONE-ACETAMINOPHEN 5-325 MG PO TABS
2.0000 | ORAL_TABLET | Freq: Once | ORAL | Status: AC
  Administered 2023-10-21: 2 via ORAL
  Filled 2023-10-21: qty 2

## 2023-10-21 MED ORDER — HYDROCODONE-ACETAMINOPHEN 5-325 MG PO TABS: 2.0000 | ORAL_TABLET | ORAL | 0 refills | Status: DC | PRN

## 2023-10-21 MED ORDER — METRONIDAZOLE 500 MG PO TABS: 500.0000 mg | ORAL_TABLET | Freq: Two times a day (BID) | ORAL | 0 refills | Status: DC

## 2023-10-21 MED ORDER — MORPHINE SULFATE (PF) 4 MG/ML IV SOLN
4.0000 mg | Freq: Once | INTRAVENOUS | Status: AC
  Administered 2023-10-21: 4 mg via INTRAVENOUS
  Filled 2023-10-21: qty 1

## 2023-10-21 MED ORDER — CIPROFLOXACIN HCL 500 MG PO TABS
500.0000 mg | ORAL_TABLET | Freq: Once | ORAL | Status: AC
  Administered 2023-10-21: 500 mg via ORAL
  Filled 2023-10-21: qty 1

## 2023-10-21 NOTE — ED Provider Notes (Signed)
  Physical Exam  BP (!) 150/90   Pulse 80   Temp 98.3 F (36.8 C)   Resp 19   Ht 5' 4 (1.626 m)   Wt 81.6 kg   SpO2 93%   BMI 30.90 kg/m   Physical Exam Vitals and nursing note reviewed.  Constitutional:      General: She is not in acute distress.    Appearance: She is well-developed.  HENT:     Head: Normocephalic and atraumatic.  Eyes:     Conjunctiva/sclera: Conjunctivae normal.  Cardiovascular:     Rate and Rhythm: Normal rate and regular rhythm.     Heart sounds: No murmur heard. Pulmonary:     Effort: Pulmonary effort is normal. No respiratory distress.     Breath sounds: Normal breath sounds.  Abdominal:     Palpations: Abdomen is soft.     Tenderness: There is no abdominal tenderness.  Musculoskeletal:        General: No swelling.     Cervical back: Neck supple.  Skin:    General: Skin is warm and dry.     Capillary Refill: Capillary refill takes less than 2 seconds.  Neurological:     Mental Status: She is alert.  Psychiatric:        Mood and Affect: Mood normal.     Procedures  Procedures  ED Course / MDM   Clinical Course as of 10/21/23 0232  Tue Oct 20, 2023  2008 With worsening abd pain and elevated WBC, we'll repeat CT imaging to evaluate for possible GI perforation from recent diverticulitis [MT]  2329 Pt signed out to Dr Lum Passe EDP pending CT scan and reassessment [MT]    Clinical Course User Index [MT] Cottie Donnice PARAS, MD   Medical Decision Making Amount and/or Complexity of Data Reviewed Labs: ordered. Radiology: ordered.  Risk Prescription drug management.   Patient received in handoff.  Recent diverticulitis diagnosis with completion of Augmentin  with recurrent left lower quadrant pain.  Pending CT at time of signout.  CT with possible developing very early uncomplicated acute diverticulitis.  We did discuss possible hospital admission given failure of outpatient antibiotics but patient has a follow-up with general  surgery in 2 days for recurrent diverticulitis and she does not want to miss this appointment.  CT findings are fairly mild and thus I will provide a short prescription for pain control and transition her antibiotics to Cipro  Flagyl .  Return precautions given which she voiced understanding she was discharged with outpatient follow-up      Passe Lum, MD 10/21/23 [phone]

## 2023-10-22 ENCOUNTER — Other Ambulatory Visit: Payer: Self-pay

## 2023-10-22 ENCOUNTER — Encounter (HOSPITAL_COMMUNITY): Payer: Self-pay

## 2023-10-22 ENCOUNTER — Inpatient Hospital Stay (HOSPITAL_COMMUNITY)
Admission: EM | Admit: 2023-10-22 | Discharge: 2023-10-30 | DRG: 330 | Disposition: A | Discharge status: Home / Self Care | Attending: Internal Medicine | Admitting: Internal Medicine

## 2023-10-22 DIAGNOSIS — D72829 Elevated white blood cell count, unspecified: Secondary | ICD-10-CM | POA: Diagnosis present

## 2023-10-22 DIAGNOSIS — Z888 Allergy status to other drugs, medicaments and biological substances status: Secondary | ICD-10-CM | POA: Diagnosis not present

## 2023-10-22 DIAGNOSIS — F411 Generalized anxiety disorder: Secondary | ICD-10-CM | POA: Diagnosis present

## 2023-10-22 DIAGNOSIS — Z8711 Personal history of peptic ulcer disease: Secondary | ICD-10-CM | POA: Diagnosis not present

## 2023-10-22 DIAGNOSIS — K660 Peritoneal adhesions (postprocedural) (postinfection): Secondary | ICD-10-CM | POA: Diagnosis present

## 2023-10-22 DIAGNOSIS — K5792 Diverticulitis of intestine, part unspecified, without perforation or abscess without bleeding: Secondary | ICD-10-CM | POA: Diagnosis not present

## 2023-10-22 DIAGNOSIS — Z716 Tobacco abuse counseling: Secondary | ICD-10-CM

## 2023-10-22 DIAGNOSIS — F32A Depression, unspecified: Secondary | ICD-10-CM | POA: Diagnosis present

## 2023-10-22 DIAGNOSIS — Z6832 Body mass index (BMI) 32.0-32.9, adult: Secondary | ICD-10-CM

## 2023-10-22 DIAGNOSIS — R531 Weakness: Secondary | ICD-10-CM

## 2023-10-22 DIAGNOSIS — F1410 Cocaine abuse, uncomplicated: Secondary | ICD-10-CM | POA: Diagnosis present

## 2023-10-22 DIAGNOSIS — K921 Melena: Secondary | ICD-10-CM | POA: Diagnosis not present

## 2023-10-22 DIAGNOSIS — F1011 Alcohol abuse, in remission: Secondary | ICD-10-CM | POA: Diagnosis present

## 2023-10-22 DIAGNOSIS — I10 Essential (primary) hypertension: Secondary | ICD-10-CM | POA: Diagnosis present

## 2023-10-22 DIAGNOSIS — F17210 Nicotine dependence, cigarettes, uncomplicated: Secondary | ICD-10-CM | POA: Diagnosis present

## 2023-10-22 DIAGNOSIS — R7989 Other specified abnormal findings of blood chemistry: Secondary | ICD-10-CM | POA: Diagnosis present

## 2023-10-22 DIAGNOSIS — R109 Unspecified abdominal pain: Secondary | ICD-10-CM | POA: Diagnosis present

## 2023-10-22 DIAGNOSIS — Z79899 Other long term (current) drug therapy: Secondary | ICD-10-CM | POA: Diagnosis not present

## 2023-10-22 DIAGNOSIS — D631 Anemia in chronic kidney disease: Secondary | ICD-10-CM | POA: Diagnosis present

## 2023-10-22 DIAGNOSIS — R7303 Prediabetes: Secondary | ICD-10-CM | POA: Diagnosis present

## 2023-10-22 DIAGNOSIS — I129 Hypertensive chronic kidney disease with stage 1 through stage 4 chronic kidney disease, or unspecified chronic kidney disease: Secondary | ICD-10-CM | POA: Diagnosis present

## 2023-10-22 DIAGNOSIS — F4322 Adjustment disorder with anxiety: Secondary | ICD-10-CM | POA: Diagnosis present

## 2023-10-22 DIAGNOSIS — F1010 Alcohol abuse, uncomplicated: Secondary | ICD-10-CM | POA: Diagnosis present

## 2023-10-22 DIAGNOSIS — F410 Panic disorder [episodic paroxysmal anxiety] without agoraphobia: Secondary | ICD-10-CM | POA: Diagnosis present

## 2023-10-22 DIAGNOSIS — F329 Major depressive disorder, single episode, unspecified: Secondary | ICD-10-CM | POA: Diagnosis present

## 2023-10-22 DIAGNOSIS — E669 Obesity, unspecified: Secondary | ICD-10-CM | POA: Diagnosis present

## 2023-10-22 DIAGNOSIS — Z801 Family history of malignant neoplasm of trachea, bronchus and lung: Secondary | ICD-10-CM | POA: Diagnosis not present

## 2023-10-22 DIAGNOSIS — Z9049 Acquired absence of other specified parts of digestive tract: Secondary | ICD-10-CM

## 2023-10-22 DIAGNOSIS — N189 Chronic kidney disease, unspecified: Secondary | ICD-10-CM | POA: Diagnosis present

## 2023-10-22 DIAGNOSIS — F419 Anxiety disorder, unspecified: Secondary | ICD-10-CM | POA: Diagnosis present

## 2023-10-22 DIAGNOSIS — K219 Gastro-esophageal reflux disease without esophagitis: Secondary | ICD-10-CM | POA: Diagnosis present

## 2023-10-22 DIAGNOSIS — Z90710 Acquired absence of both cervix and uterus: Secondary | ICD-10-CM

## 2023-10-22 DIAGNOSIS — K5732 Diverticulitis of large intestine without perforation or abscess without bleeding: Secondary | ICD-10-CM | POA: Diagnosis present

## 2023-10-22 DIAGNOSIS — D75839 Thrombocytosis, unspecified: Secondary | ICD-10-CM | POA: Diagnosis present

## 2023-10-22 DIAGNOSIS — N179 Acute kidney failure, unspecified: Secondary | ICD-10-CM | POA: Diagnosis present

## 2023-10-22 DIAGNOSIS — R112 Nausea with vomiting, unspecified: Secondary | ICD-10-CM | POA: Diagnosis not present

## 2023-10-22 DIAGNOSIS — R000 Tachycardia, unspecified: Secondary | ICD-10-CM | POA: Diagnosis present

## 2023-10-22 DIAGNOSIS — D500 Iron deficiency anemia secondary to blood loss (chronic): Secondary | ICD-10-CM | POA: Diagnosis present

## 2023-10-22 DIAGNOSIS — R7401 Elevation of levels of liver transaminase levels: Secondary | ICD-10-CM | POA: Diagnosis present

## 2023-10-22 DIAGNOSIS — R1032 Left lower quadrant pain: Secondary | ICD-10-CM | POA: Diagnosis not present

## 2023-10-22 LAB — URINALYSIS, ROUTINE W REFLEX MICROSCOPIC
Bilirubin Urine: NEGATIVE
Glucose, UA: NEGATIVE mg/dL
Hgb urine dipstick: NEGATIVE
Ketones, ur: NEGATIVE mg/dL
Nitrite: NEGATIVE
Protein, ur: 100 mg/dL — AB
Specific Gravity, Urine: 1.017 (ref 1.005–1.030)
pH: 6 (ref 5.0–8.0)

## 2023-10-22 LAB — COMPREHENSIVE METABOLIC PANEL WITH GFR
ALT: 33 U/L (ref 0–44)
AST: 31 U/L (ref 15–41)
Albumin: 3.6 g/dL (ref 3.5–5.0)
Alkaline Phosphatase: 73 U/L (ref 38–126)
Anion gap: 10 (ref 5–15)
BUN: 11 mg/dL (ref 6–20)
CO2: 25 mmol/L (ref 22–32)
Calcium: 9.4 mg/dL (ref 8.9–10.3)
Chloride: 102 mmol/L (ref 98–111)
Creatinine, Ser: 1.18 mg/dL — ABNORMAL HIGH (ref 0.44–1.00)
GFR, Estimated: 53 mL/min — ABNORMAL LOW (ref >60)
Glucose, Bld: 111 mg/dL — ABNORMAL HIGH (ref 70–99)
Potassium: 3.9 mmol/L (ref 3.5–5.1)
Sodium: 137 mmol/L (ref 135–145)
Total Bilirubin: 0.2 mg/dL (ref 0.0–1.2)
Total Protein: 7.6 g/dL (ref 6.5–8.1)

## 2023-10-22 LAB — CBC WITH DIFFERENTIAL/PLATELET
Abs Immature Granulocytes: 0.05 K/uL (ref 0.00–0.07)
Basophils Absolute: 0 K/uL (ref 0.0–0.1)
Basophils Relative: 0 %
Eosinophils Absolute: 0.1 K/uL (ref 0.0–0.5)
Eosinophils Relative: 1 %
HCT: 35.8 % — ABNORMAL LOW (ref 36.0–46.0)
Hemoglobin: 11.5 g/dL — ABNORMAL LOW (ref 12.0–15.0)
Immature Granulocytes: 0 %
Lymphocytes Relative: 16 %
Lymphs Abs: 2.2 K/uL (ref 0.7–4.0)
MCH: 26.1 pg (ref 26.0–34.0)
MCHC: 32.1 g/dL (ref 30.0–36.0)
MCV: 81.2 fL (ref 80.0–100.0)
Monocytes Absolute: 0.9 K/uL (ref 0.1–1.0)
Monocytes Relative: 7 %
Neutro Abs: 10.3 K/uL — ABNORMAL HIGH (ref 1.7–7.7)
Neutrophils Relative %: 76 %
Platelets: 551 K/uL — ABNORMAL HIGH (ref 150–400)
RBC: 4.41 MIL/uL (ref 3.87–5.11)
RDW: 16.5 % — ABNORMAL HIGH (ref 11.5–15.5)
WBC: 13.5 K/uL — ABNORMAL HIGH (ref 4.0–10.5)
nRBC: 0 % (ref 0.0–0.2)

## 2023-10-22 LAB — I-STAT CG4 LACTIC ACID, ED: Lactic Acid, Venous: 0.7 mmol/L (ref 0.5–1.9)

## 2023-10-22 LAB — LIPASE, BLOOD: Lipase: 29 U/L (ref 11–51)

## 2023-10-22 MED ORDER — HYDROMORPHONE HCL 1 MG/ML IJ SOLN
1.0000 mg | Freq: Once | INTRAMUSCULAR | Status: AC
  Administered 2023-10-22: 1 mg via INTRAVENOUS
  Filled 2023-10-22: qty 1

## 2023-10-22 MED ORDER — SODIUM CHLORIDE 0.9% FLUSH
3.0000 mL | Freq: Two times a day (BID) | INTRAVENOUS | Status: DC
  Administered 2023-10-22 – 2023-10-30 (×18): 3 mL via INTRAVENOUS

## 2023-10-22 MED ORDER — BUSPIRONE HCL 15 MG PO TABS
7.5000 mg | ORAL_TABLET | Freq: Two times a day (BID) | ORAL | Status: DC
  Administered 2023-10-22 – 2023-10-23 (×3): 7.5 mg via ORAL
  Filled 2023-10-22 (×3): qty 1
  Filled 2023-10-22 (×2): qty 2
  Filled 2023-10-22: qty 1
  Filled 2023-10-22: qty 2

## 2023-10-22 MED ORDER — LACTATED RINGERS IV SOLN: INTRAVENOUS | Status: DC

## 2023-10-22 MED ORDER — CIPROFLOXACIN IN D5W 400 MG/200ML IV SOLN
400.0000 mg | Freq: Once | INTRAVENOUS | Status: AC
  Administered 2023-10-22: 400 mg via INTRAVENOUS
  Filled 2023-10-22: qty 200

## 2023-10-22 MED ORDER — ACETAMINOPHEN 325 MG PO TABS
650.0000 mg | ORAL_TABLET | Freq: Four times a day (QID) | ORAL | Status: DC | PRN
Start: 2023-10-22 — End: 2023-10-29
  Administered 2023-10-22 – 2023-10-27 (×4): 650 mg via ORAL
  Filled 2023-10-22 (×3): qty 2

## 2023-10-22 MED ORDER — PANTOPRAZOLE SODIUM 40 MG IV SOLR
40.0000 mg | Freq: Two times a day (BID) | INTRAVENOUS | Status: DC
  Administered 2023-10-22 – 2023-10-28 (×17): 40 mg via INTRAVENOUS
  Filled 2023-10-22 (×12): qty 10

## 2023-10-22 MED ORDER — RISPERIDONE 1 MG PO TABS
1.0000 mg | ORAL_TABLET | Freq: Two times a day (BID) | ORAL | Status: DC
Start: 2023-10-22 — End: 2023-10-23
  Administered 2023-10-23: 1 mg via ORAL
  Filled 2023-10-22 (×3): qty 1

## 2023-10-22 MED ORDER — LORAZEPAM 1 MG PO TABS
1.0000 mg | ORAL_TABLET | ORAL | Status: AC | PRN
  Administered 2023-10-23: 2 mg via ORAL
  Filled 2023-10-22: qty 2

## 2023-10-22 MED ORDER — LORAZEPAM 2 MG/ML IJ SOLN
1.0000 mg | INTRAMUSCULAR | Status: AC | PRN
  Administered 2023-10-23 (×2): 2 mg via INTRAVENOUS
  Filled 2023-10-22 (×3): qty 1

## 2023-10-22 MED ORDER — THIAMINE HCL 100 MG/ML IJ SOLN
100.0000 mg | Freq: Every day | INTRAMUSCULAR | Status: DC
  Administered 2023-10-24: 100 mg via INTRAVENOUS
  Filled 2023-10-22 (×4): qty 2

## 2023-10-22 MED ORDER — FOLIC ACID 1 MG PO TABS
1.0000 mg | ORAL_TABLET | Freq: Every day | ORAL | Status: DC
  Administered 2023-10-22 – 2023-10-30 (×12): 1 mg via ORAL
  Filled 2023-10-22 (×9): qty 1

## 2023-10-22 MED ORDER — ONDANSETRON HCL 4 MG/2ML IJ SOLN
4.0000 mg | Freq: Once | INTRAMUSCULAR | Status: AC
  Administered 2023-10-22: 4 mg via INTRAVENOUS
  Filled 2023-10-22: qty 2

## 2023-10-22 MED ORDER — ADULT MULTIVITAMIN W/MINERALS CH
1.0000 | ORAL_TABLET | Freq: Every day | ORAL | Status: DC
  Administered 2023-10-22 – 2023-10-30 (×12): 1 via ORAL
  Filled 2023-10-22 (×9): qty 1

## 2023-10-22 MED ORDER — PIPERACILLIN-TAZOBACTAM 3.375 G IVPB 30 MIN
3.3750 g | Freq: Once | INTRAVENOUS | Status: AC
  Administered 2023-10-22: 3.375 g via INTRAVENOUS
  Filled 2023-10-22: qty 50

## 2023-10-22 MED ORDER — ACETAMINOPHEN 650 MG RE SUPP
650.0000 mg | Freq: Four times a day (QID) | RECTAL | Status: DC | PRN
Start: 2023-10-22 — End: 2023-10-29

## 2023-10-22 MED ORDER — PIPERACILLIN-TAZOBACTAM 3.375 G IVPB
3.3750 g | Freq: Three times a day (TID) | INTRAVENOUS | Status: DC
  Administered 2023-10-23 – 2023-10-29 (×28): 3.375 g via INTRAVENOUS
  Filled 2023-10-22 (×20): qty 50

## 2023-10-22 MED ORDER — FLUOXETINE HCL 20 MG PO CAPS
20.0000 mg | ORAL_CAPSULE | Freq: Every day | ORAL | Status: DC
Start: 2023-10-22 — End: 2023-10-30
  Administered 2023-10-22 – 2023-10-30 (×12): 20 mg via ORAL
  Filled 2023-10-22 (×9): qty 1

## 2023-10-22 MED ORDER — METRONIDAZOLE 500 MG/100ML IV SOLN
500.0000 mg | Freq: Once | INTRAVENOUS | Status: AC
  Administered 2023-10-22: 500 mg via INTRAVENOUS
  Filled 2023-10-22: qty 100

## 2023-10-22 MED ORDER — THIAMINE MONONITRATE 100 MG PO TABS
100.0000 mg | ORAL_TABLET | Freq: Every day | ORAL | Status: DC
  Administered 2023-10-22 – 2023-10-30 (×11): 100 mg via ORAL
  Filled 2023-10-22 (×9): qty 1

## 2023-10-22 MED ORDER — NICOTINE 21 MG/24HR TD PT24
21.0000 mg | MEDICATED_PATCH | Freq: Every day | TRANSDERMAL | Status: DC
  Administered 2023-10-22 – 2023-10-30 (×12): 21 mg via TRANSDERMAL
  Filled 2023-10-22 (×9): qty 1

## 2023-10-22 MED ORDER — MORPHINE SULFATE (PF) 2 MG/ML IV SOLN
2.0000 mg | INTRAVENOUS | Status: DC | PRN
  Administered 2023-10-22 – 2023-10-25 (×12): 2 mg via INTRAVENOUS
  Filled 2023-10-22 (×13): qty 1

## 2023-10-22 MED ORDER — HEPARIN SODIUM (PORCINE) 5000 UNIT/ML IJ SOLN
5000.0000 [IU] | Freq: Two times a day (BID) | INTRAMUSCULAR | Status: DC
  Administered 2023-10-22 – 2023-10-30 (×21): 5000 [IU] via SUBCUTANEOUS
  Filled 2023-10-22 (×17): qty 1

## 2023-10-22 NOTE — Plan of Care (Signed)

## 2023-10-22 NOTE — H&P (Signed)
 History and Physical    Patient: Natalie Edwards FMW:[id number] DOB: 03-09-1966 DOA: 10/22/2023 DOS: the patient was seen and examined on 10/22/2023 . PCP: Tanda Bleacher, MD  Patient coming from: Home Chief complaint: Chief Complaint  Patient presents with   Abdominal Pain   HPI:  Natalie Edwards is a 58 y.o. female with past medical history  of colon history of hydroxyzine , NSAIDs, Ambien , ibuprofen , past medical history diverticulitis, acute kidney injury, essential hypertension, iron  deficiency anemia from chronic blood loss, generalized anxiety disorder GERD, peptic ulcer disease with hemorrhage,, prediabetes, cocaine and alcohol abuse, rhabdomyolysis, major depression, history of suicidal ideation, is being admitted today at [facility] on October 22, 2023 with complaints of abdominal pain from diverticulitis no CT imaging of the abdomen today.  Patient has had multiple visits in the emergency room from the past few months for abdominal pain.  Per report patient's pain has been worse over the past few days and more constant today been crampy and also reports nausea and vomiting unable to keep anything down is very dehydrated and fatigued .  Otherwise at bedside patient is in no distress and disoriented.  No reports of tobacco alcohol or drugs.  ED Course:  Vital signs in the ED were notable for the following:  Vitals:   10/22/23 1015 10/22/23 1300 10/22/23 1338 10/22/23 1423  BP:  133/72  135/78  Pulse: 82 78  77  Temp:  97.8 F (36.6 C)  98.3 F (36.8 C)  Resp: 17 14  18   Height:      Weight:      SpO2: 99% 99% 91% 91%  TempSrc:  Oral  Oral  BMI (Calculated):      >>ED evaluation thus far shows: CMP shows glucose 111 AKI with a creatinine of 1.18 EGFR 53 normal LFTs normal lactic acid normal electrolytes. CBC shows white count of 13.5 hemoglobin of 11.5 RDW of 16.5 and platelets of 551. CT of the abdomen and pelvis with contrast done yesterday showed diverticulosis with sigmoid colon  uncomplicated acute diverticulitis.  >>While in the ED patient received the following: Medications  HYDROmorphone  (DILAUDID ) injection 1 mg (1 mg Intravenous Given 10/22/23 1048)  ciprofloxacin  (CIPRO ) IVPB 400 mg (0 mg Intravenous Stopped 10/22/23 1241)  metroNIDAZOLE  (FLAGYL ) IVPB 500 mg (0 mg Intravenous Stopped 10/22/23 1315)  ondansetron  (ZOFRAN ) injection 4 mg (4 mg Intravenous Given 10/22/23 1052)  HYDROmorphone  (DILAUDID ) injection 1 mg (1 mg Intravenous Given 10/22/23 1241)   Review of Systems  Gastrointestinal:  Positive for abdominal pain, nausea and vomiting.   Past Medical History:  Diagnosis Date   Allergy    Anemia    Diverticulosis    Gall bladder stones 1993   Gastric ulcer    GERD (gastroesophageal reflux disease)    Hypertension    Renal mass    Past Surgical History:  Procedure Laterality Date   ABDOMINAL HYSTERECTOMY     BIOPSY  09/29/2022   Procedure: BIOPSY;  Surgeon: Shila Gustav GAILS, MD;  Location: [facility] ENDOSCOPY;  Service: Gastroenterology;;   CHOLECYSTECTOMY     COLONOSCOPY WITH ESOPHAGOGASTRODUODENOSCOPY (EGD)  04/07/2023   [facility]   ESOPHAGOGASTRODUODENOSCOPY (EGD) WITH PROPOFOL  N/A 09/29/2022   Procedure: ESOPHAGOGASTRODUODENOSCOPY (EGD) WITH PROPOFOL ;  Surgeon: Shila Gustav GAILS, MD;  Location: [facility] ENDOSCOPY;  Service: Gastroenterology;  Laterality: N/A;    reports that she has been smoking cigarettes. She has a 12.5 pack-year smoking history. She has never used smokeless tobacco. She reports current alcohol use. She reports that  she does not use drugs. Allergies  Allergen Reactions   Hydroxyzine  Palpitations and Other (See Comments)    Jitteriness, too   Nsaids Other (See Comments)    History of ulcers   Ambien  [Zolpidem  Tartrate] Other (See Comments)    Jitteriness, nervousness, abdominal pain   Ibuprofen  Other (See Comments)    History of ulcers   Family History  Problem Relation Age of Onset   Cancer Maternal Uncle        Lung    Cancer Maternal Uncle        Lung   Cancer Maternal Uncle        Lung   Headache Neg Hx        I don't think so   Migraines Neg Hx        I don't think so   Prior to Admission medications   Medication Sig Start Date End Date Taking? Authorizing Provider  busPIRone  (BUSPAR ) 7.5 MG tablet Take 7.5 mg by mouth 2 (two) times daily. 09/15/23   [provider]  ciprofloxacin  (CIPRO ) 500 MG tablet Take 1 tablet (500 mg total) by mouth every 12 (twelve) hours. 10/21/23   Kommor, Madison, MD  dicyclomine  (BENTYL ) 20 MG tablet Take 1 tablet (20 mg total) by mouth 2 (two) times daily. 10/08/23   Elnor Jayson LABOR, DO  FLUoxetine  (PROZAC ) 20 MG capsule Take 20 mg by mouth daily. 09/15/23   [provider]  gabapentin  (NEURONTIN ) 300 MG capsule Take 1 capsule (300 mg total) by mouth 2 (two) times daily. Patient taking differently: Take 300 mg by mouth 2 (two) times daily as needed (for shoulder pain). 08/06/23   Tanda Bleacher, MD  HYDROcodone -acetaminophen  (NORCO/VICODIN) 5-325 MG tablet Take 2 tablets by mouth every 4 (four) hours as needed. 10/21/23   Kommor, Madison, MD  metroNIDAZOLE  (FLAGYL ) 500 MG tablet Take 1 tablet (500 mg total) by mouth 2 (two) times daily for 5 days. 10/21/23 10/26/23  Kommor, Madison, MD  mirtazapine  (REMERON ) 45 MG tablet Take 45 mg by mouth at bedtime. 09/25/23   [provider]  naphazoline-glycerin  (CLEAR EYES REDNESS) 0.012-0.25 % SOLN Place 1-2 drops into both eyes 4 (four) times daily as needed for eye irritation. 06/11/23   Jadapalle, Sree, MD  nicotine  (NICODERM CQ  - DOSED IN MG/24 HOURS) 21 mg/24hr patch Place 1 patch (21 mg total) onto the skin daily. 06/12/23   Jadapalle, Sree, MD  pantoprazole  (PROTONIX ) 40 MG tablet Take 1 tablet (40 mg total) by mouth 2 (two) times daily. 09/02/23   May, Deanna J, NP  pregabalin  (LYRICA ) 75 MG capsule Take 75 mg by mouth 2 (two) times daily.    [provider]  risperiDONE  (RISPERDAL ) 1 MG tablet Take 1 mg  by mouth 2 (two) times daily.    [provider]  temazepam  (RESTORIL ) 15 MG capsule Take 15 mg by mouth at bedtime as needed. 09/29/23   [provider]                                                                                 Vitals:   10/22/23 1015 10/22/23 1300 10/22/23 1338 10/22/23 1423  BP:  133/72  135/78  Pulse: 82 78  77  Resp: 17 14  18   Temp:  97.8 F (36.6 C)  98.3 F (36.8 C)  TempSrc:  Oral  Oral  SpO2: 99% 99% 91% 91%  Weight:      Height:       Physical Exam Vitals reviewed.  Constitutional:      General: She is not in acute distress.    Appearance: She is not ill-appearing.  HENT:     Head: Normocephalic.  Eyes:     Extraocular Movements: Extraocular movements intact.  Cardiovascular:     Rate and Rhythm: Normal rate and regular rhythm.     Heart sounds: Normal heart sounds.  Pulmonary:     Breath sounds: Normal breath sounds.  Abdominal:     General: There is no distension.     Palpations: Abdomen is soft.     Tenderness: There is abdominal tenderness in the left lower quadrant. There is guarding.  Neurological:     General: No focal deficit present.     Mental Status: She is alert and oriented to person, place, and time.     Labs on Admission: I have personally reviewed following labs and imaging studies CBC: Recent Labs  Lab 10/20/23 1940 10/22/23 1041  WBC 17.8* 13.5*  NEUTROABS 12.8* 10.3*  HGB 11.3* 11.5*  HCT 34.7* 35.8*  MCV 81.6 81.2  PLT 492* 551*   Basic Metabolic Panel: Recent Labs  Lab 10/20/23 1940 10/22/23 1041  NA 140 137  K 4.4 3.9  CL 102 102  CO2 24 25  GLUCOSE 97 111*  BUN 15 11  CREATININE 1.03* 1.18*  CALCIUM  9.1 9.4   GFR: Estimated Creatinine Clearance: 53.1 mL/min (A) (by C-G formula based on SCr of 1.18 mg/dL (H)). Liver Function Tests: Recent Labs  Lab 10/20/23 1940 10/22/23 1041  AST 28 31  ALT 32 33  ALKPHOS 79 73  BILITOT 0.4 0.2  PROT 7.4 7.6  ALBUMIN 3.4* 3.6    Recent Labs  Lab 10/20/23 1940 10/22/23 1041  LIPASE 35 29   No results for input(s): AMMONIA in the last 168 hours. Recent Labs    05/30/23 0624 07/28/23 1843 08/03/23 1147 08/30/23 1812 09/02/23 1449 09/09/23 0834 09/22/23 1744 10/07/23 2002 10/20/23 1940 10/22/23 1041  BUN 21* 15 9 22* 13 12 12 13 15 11   CREATININE 1.07* 1.10* 0.98 1.05* 1.04 1.23* 1.00 1.01* 1.03* 1.18*    Estimated Creatinine Clearance: 53.1 mL/min (A) (by C-G formula based on SCr of 1.18 mg/dL (H)).   Recent Labs    05/30/23 0624 07/28/23 1843 08/03/23 1147 08/30/23 1812 09/02/23 1449 09/09/23 0834 09/22/23 1744 10/07/23 2002 10/20/23 1940 10/22/23 1041  BUN 21* 15 9 22* 13 12 12 13 15 11   CREATININE 1.07* 1.10* 0.98 1.05* 1.04 1.23* 1.00 1.01* 1.03* 1.18*  CO2 26 20* 20* 23 31 24  20* 22 24 25    Cardiac Enzymes: No results for input(s): CKTOTAL, CKMB, CKMBINDEX, TROPONINI in the last 168 hours. BNP (last 3 results) No results for input(s): PROBNP in the last 8760 hours. HbA1C: No results for input(s): HGBA1C in the last 72 hours. CBG: No results for input(s): GLUCAP in the last 168 hours. Lipid Profile: No results for input(s): CHOL, HDL, LDLCALC, TRIG, CHOLHDL, LDLDIRECT in the last 72 hours. Thyroid  Function Tests: No results for input(s): TSH, T4TOTAL, FREET4, T3FREE, THYROIDAB in the last 72 hours. Anemia Panel: No results for input(s): VITAMINB12, FOLATE, FERRITIN, TIBC, IRON , RETICCTPCT in the  last 72 hours. Urine analysis:    Component Value Date/Time   COLORURINE YELLOW 10/22/2023 1041   APPEARANCEUR CLEAR 10/22/2023 1041   LABSPEC 1.017 10/22/2023 1041   PHURINE 6.0 10/22/2023 1041   GLUCOSEU NEGATIVE 10/22/2023 1041   HGBUR NEGATIVE 10/22/2023 1041   BILIRUBINUR NEGATIVE 10/22/2023 1041   BILIRUBINUR negative 06/30/2018 1005   KETONESUR NEGATIVE 10/22/2023 1041   PROTEINUR 100 (A) 10/22/2023 1041   UROBILINOGEN  0.2 06/30/2018 1005   NITRITE NEGATIVE 10/22/2023 1041   LEUKOCYTESUR TRACE (A) 10/22/2023 1041   Radiological Exams on Admission: CT ABDOMEN PELVIS W CONTRAST Result Date: 10/21/2023 CLINICAL DATA:  Diverticulitis, complication suspected prior hx of diverticulitis with worsening pain - evaluation for perforation EXAM: CT ABDOMEN AND PELVIS WITH CONTRAST TECHNIQUE: Multidetector CT imaging of the abdomen and pelvis was performed using the standard protocol following bolus administration of intravenous contrast. RADIATION DOSE REDUCTION: This exam was performed according to the departmental dose-optimization program which includes automated exposure control, adjustment of the mA and/or kV according to patient size and/or use of iterative reconstruction technique. CONTRAST:  OMNIPAQUE  IOHEXOL  300 MG/ML  SOLN COMPARISON:  CT abdomen pelvis 10/07/2023 FINDINGS: Lower chest: Bibasilar atelectasis. Hepatobiliary: The hepatic parenchyma is diffusely hypodense compared to the splenic parenchyma consistent with fatty infiltration. No focal liver abnormality. Status post cholecystectomy. No intrahepatic biliary dilatation. Mild common bile duct wall dilatation which can be seen in the post cholecystectomy setting. Pancreas: No focal lesion. Normal pancreatic contour. No surrounding inflammatory changes. No main pancreatic ductal dilatation. Spleen: Normal in size without focal abnormality. Adrenals/Urinary Tract: No adrenal nodule bilaterally. Bilateral kidneys enhance symmetrically. Fluid density lesions of the bilateral kidneys likely represent simple renal cysts. Simple renal cysts, in the absence of clinically indicated signs/symptoms, require no independent follow-up. No hydronephrosis. No hydroureter.  No nephroureterolithiasis. The urinary bladder is unremarkable. On delayed imaging, there is no urothelial wall thickening and there are no filling defects in the opacified portions of the bilateral collecting  systems or ureters. Stomach/Bowel: Stomach is within normal limits. No evidence of small bowel wall thickening or dilatation. Colonic diverticulosis. Question mild bowel wall thickening and pericolonic fat stranding along the focal proximal sigmoid colon diverticula (2:54). Appendix appears normal. Vascular/Lymphatic: No abdominal aorta or iliac aneurysm. Severe atherosclerotic plaque of the aorta and its branches. No abdominal, pelvic, or inguinal lymphadenopathy. Reproductive: Status post hysterectomy. No adnexal masses. Other: No intraperitoneal free fluid. No intraperitoneal free gas. No organized fluid collection. Musculoskeletal: Tiny fat containing supraumbilical ventral wall hernia (9:105). No suspicious lytic or blastic osseous lesions. No acute displaced fracture. IMPRESSION: 1. Colonic diverticulosis with question developing mild proximal sigmoid colon uncomplicated acute diverticulitis. Recommend colonoscopy status post treatment and status post complete resolution of inflammatory changes to exclude an underlying lesion. 2.  Status post cholecystectomy. 3. Tiny fat containing supraumbilical ventral wall hernia. These images were dictated once made available on PACS. Electronically Signed   By: Morgane  Naveau M.D.   On: 10/21/2023 02:21   Data Reviewed: Relevant notes from primary care and specialist visits, past discharge summaries as available in EHR, including Care Everywhere . Prior diagnostic testing as pertinent to current admission diagnoses, Updated medications and problem lists for reconciliation .ED course, including vitals, labs, imaging, treatment and response to treatment,Triage notes, nursing and pharmacy notes and ED provider's notes.Notable results as noted in HPI.Discussed case with EDMD/ ED APP/ or Specialty MD on call and as needed.  Assessment & Plan  >> Abdominal pain: Secondary to diverticulitis,  will start patient on IV antibiotics and follow.  Per ED provider general  surgery had no additional recommendations.  Patient will need GI for outpatient follow-up once she gets discharged.  >> History of alcohol abuse: Patient denies any alcohol abuse or any recent use. CIWA protocol.   >> Tobacco abuse: Nicotine  patch counseling once patient is stable.  >> Essential hypertension Vitals:   10/22/23 1008 10/22/23 1009 10/22/23 1300 10/22/23 1423  BP: (!) 141/85 132/83 133/72 135/78  No meds.   >> Generalized anxiety disorder/depression: Continue patient's BuSpar , Prozac , Risperdal .    >> GERD/history of peptic ulcer disease: IV PPI.  Aspiration precaution.  DVT prophylaxis:  Heparin  Consults:  None None Advance Care Planning:    Code Status: Full Code   Family Communication:  None Disposition Plan:  Home Severity of Illness: The appropriate patient status for this patient is INPATIENT. Inpatient status is judged to be reasonable and necessary in order to provide the required intensity of service to ensure the patient's safety. The patient's presenting symptoms, physical exam findings, and initial radiographic and laboratory data in the context of their chronic comorbidities is felt to place them at high risk for further clinical deterioration. Furthermore, it is not anticipated that the patient will be medically stable for discharge from the hospital within 2 midnights of admission.   * I certify that at the point of admission it is my clinical judgment that the patient will require inpatient hospital care spanning beyond 2 midnights from the point of admission due to high intensity of service, high risk for further deterioration and high frequency of surveillance required.*  Unresulted Labs (From admission, onward)     Start     Ordered   10/23/23 0500  Comprehensive metabolic panel  Tomorrow morning,   R        10/22/23 1341   10/23/23 0500  CBC  Tomorrow morning,   R        10/22/23 1341   10/23/23 0500  HIV Antibody (routine testing w  rflx)  Once,   R        10/23/23 0500            Meds ordered this encounter  Medications   HYDROmorphone  (DILAUDID ) injection 1 mg   ciprofloxacin  (CIPRO ) IVPB 400 mg    Antibiotic Indication::   Intra-abdominal Infection   metroNIDAZOLE  (FLAGYL ) IVPB 500 mg    Antibiotic Indication::   Intra-abdominal Infection   ondansetron  (ZOFRAN ) injection 4 mg   HYDROmorphone  (DILAUDID ) injection 1 mg   busPIRone  (BUSPAR ) tablet 7.5 mg   FLUoxetine  (PROZAC ) capsule 20 mg   nicotine  (NICODERM CQ  - dosed in mg/24 hours) patch 21 mg   heparin  injection 5,000 Units   sodium chloride  flush (NS) 0.9 % injection 3 mL   OR Linked Order Group    acetaminophen  (TYLENOL ) tablet 650 mg    acetaminophen  (TYLENOL ) suppository 650 mg   morphine  (PF) 2 MG/ML injection 2 mg   lactated ringers  infusion   pantoprazole  (PROTONIX ) injection 40 mg   risperiDONE  (RISPERDAL ) tablet 1 mg   OR Linked Order Group    LORazepam  (ATIVAN ) tablet 1-4 mg     CIWA-AR < 5 =:   0 mg     CIWA-AR 5 -10 =:   1 mg     CIWA-AR 11 -15 =:   2 mg     CIWA-AR 16 -20 =:   3 mg     CIWA-AR 16 -20 =:  Recheck CIWA-AR in 1 hour; if > 20 notify MD     CIWA-AR > 20 =:   4 mg     CIWA-AR > 20 =:   Call Rapid Response    LORazepam  (ATIVAN ) injection 1-4 mg     CIWA-AR < 5 =:   0 mg     CIWA-AR 5 -10 =:   1 mg     CIWA-AR 11 -15 =:   2 mg     CIWA-AR 16 -20 =:   3 mg     CIWA-AR 16 -20 =:   Recheck CIWA-AR in 1 hour; if > 20 notify MD     CIWA-AR > 20 =:   4 mg     CIWA-AR > 20 =:   Call Rapid Response   OR Linked Order Group    thiamine  (VITAMIN B1) tablet 100 mg    thiamine  (VITAMIN B1) injection 100 mg   folic acid  (FOLVITE ) tablet 1 mg   multivitamin with minerals tablet 1 tablet     Orders Placed This Encounter  Procedures   CBC with Differential   Comprehensive metabolic panel   Lipase, blood   Urinalysis, Routine w reflex microscopic -Urine, Clean Catch   Comprehensive metabolic panel   CBC   HIV Antibody  (routine testing w rflx)   Maintain IV access   Vital signs   Notify physician (specify)   Mobility Protocol: No Restrictions   Refer to Sidebar Report Mobility Protocol for Adult Inpatient   Initiate Adult Central Line Maintenance and Catheter Protocol for patients with central line (CVC, PICC, Port, Hemodialysis, Trialysis)   Daily weights   Intake and Output   Initiate CHG Protocol   Do not place and if present remove PureWick   Initiate Oral Care Protocol   Initiate Carrier Fluid Protocol   RN may order General Admission PRN Orders utilizing General Admission PRN medications (through manage orders) for the following patient needs: allergy symptoms (Claritin ), cold sores (Carmex), cough (Robitussin DM), eye irritation (Liquifilm Tears), hemorrhoids (Tucks), indigestion (Maalox), minor skin irritation (Hydrocortisone Cream), muscle pain (Ben Gay), nose irritation (saline nasal spray) and sore throat (Chloraseptic spray).   Cardiac Monitoring - Continuous Indefinite   Vital signs every 6 hours X 48 hours, then per unit protocol   Refer to Sidebar Report for reference: ETOH Withdrawal Guidelines   Clinical Institute Withdrawal Assessent (CIWA)   If Ativan  given, reassess Clinical Institute Withdrawal Assessment (CIWA) with blood pressure and pulse rate within 1 hour of Ativan  administration   Notify Pharmacy to change IV Ativan  to PO if tolerating POs well.   Notify physician (specify)   Full code   Consult to hospitalist   piperacillin -tazobactam (ZOSYN ) per pharmacy consult   Consult to Transition of Care Team   Pulse oximetry check with vital signs   Oxygen therapy Mode or (Route): Nasal cannula; Liters Per Minute: 2; Keep O2 saturation between: greater than 92 %   I-Stat CG4 Lactic Acid   Insert peripheral IV   Admit to Inpatient (patient's expected length of stay will be greater than 2 midnights or inpatient only procedure)   Aspiration precautions   Fall precautions     Author: Mario LULLA Blanch, MD 12 pm- 8 pm. [facility]. 10/22/2023 5:16 PM Please note for any communication after hours contact [facility] Assigned provider on call on Amion.

## 2023-10-22 NOTE — ED Triage Notes (Signed)
 Pt arrived with EMS from f/u PCP appt after visit to [facility] for abd pain on Tuesday for abd pain. Reports that MD sent patient back to ER fro re-evaluation as she's in too much pain. Pt c/o L side abd pain, n/v w/o diarrhea/constipation since Tuesday

## 2023-10-22 NOTE — ED Provider Notes (Signed)
 [place] EMERGENCY DEPARTMENT AT [facility] Provider Note   CSN: [id number] Arrival date & time: 10/22/23  1002     Patient presents with: Abdominal Pain   Myrtis Maille is a 58 y.o. female.   The history is provided by the patient and medical records. No language interpreter was used.  Abdominal Pain    58 year old female with history of diverticular disease, hypertension, renal mass, GERD, polysubstance use including alcohol and cocaine use, intractable nausea and vomiting brought here via EMS from PCP office for complaint of abdominal pain.  Patient states for the past 4 to 5 days she has had persistent left lower quadrant abdominal pain.  Described pain as a sharp crampy achy sensation moderate to severe with associated nausea and vomiting.  She did notice some blood in his stool a few days ago.  She denies fever or chills denies any urinary symptoms.  She has been seen and evaluated multiple times in the ED for her complaint within the past few days and had a CT scan that show evidence of diverticulitis.  She was prescribed Cipro  and Flagyl  yesterday and states despite taking antibiotic her symptoms is not getting better.  She was seen at  her PCP office who sent patient here for further care.  Prior to Admission medications   Medication Sig Start Date End Date Taking? Authorizing Provider  busPIRone  (BUSPAR ) 7.5 MG tablet Take 7.5 mg by mouth 2 (two) times daily. 09/15/23   [provider]  ciprofloxacin  (CIPRO ) 500 MG tablet Take 1 tablet (500 mg total) by mouth every 12 (twelve) hours. 10/21/23   Kommor, Madison, MD  dicyclomine  (BENTYL ) 20 MG tablet Take 1 tablet (20 mg total) by mouth 2 (two) times daily. 10/08/23   Elnor Jayson LABOR, DO  FLUoxetine  (PROZAC ) 20 MG capsule Take 20 mg by mouth daily. 09/15/23   [provider]  gabapentin  (NEURONTIN ) 300 MG capsule Take 1 capsule (300 mg total) by mouth 2 (two) times daily. Patient taking differently: Take 300 mg  by mouth 2 (two) times daily as needed (for shoulder pain). 08/06/23   Tanda Bleacher, MD  HYDROcodone -acetaminophen  (NORCO/VICODIN) 5-325 MG tablet Take 2 tablets by mouth every 4 (four) hours as needed. 10/21/23   Kommor, Madison, MD  metroNIDAZOLE  (FLAGYL ) 500 MG tablet Take 1 tablet (500 mg total) by mouth 2 (two) times daily for 5 days. 10/21/23 10/26/23  Kommor, Madison, MD  mirtazapine  (REMERON ) 45 MG tablet Take 45 mg by mouth at bedtime. 09/25/23   [provider]  naphazoline-glycerin  (CLEAR EYES REDNESS) 0.012-0.25 % SOLN Place 1-2 drops into both eyes 4 (four) times daily as needed for eye irritation. 06/11/23   Jadapalle, Sree, MD  nicotine  (NICODERM CQ  - DOSED IN MG/24 HOURS) 21 mg/24hr patch Place 1 patch (21 mg total) onto the skin daily. 06/12/23   Jadapalle, Sree, MD  pantoprazole  (PROTONIX ) 40 MG tablet Take 1 tablet (40 mg total) by mouth 2 (two) times daily. 09/02/23   May, Deanna J, NP  pregabalin  (LYRICA ) 75 MG capsule Take 75 mg by mouth 2 (two) times daily.    [provider]  risperiDONE  (RISPERDAL ) 1 MG tablet Take 1 mg by mouth 2 (two) times daily.    [provider]  temazepam  (RESTORIL ) 15 MG capsule Take 15 mg by mouth at bedtime as needed. 09/29/23   [provider]    Allergies: Hydroxyzine , Nsaids, Ambien  [zolpidem  tartrate], and Ibuprofen     Review of Systems  Gastrointestinal:  Positive for abdominal pain.  All other systems reviewed and are negative.   Updated Vital Signs BP 132/83 (BP Location: Left Arm)   Pulse 82   Temp 97.9 F (36.6 C) (Oral)   Resp 20   Ht 5' 4 (1.626 m)   Wt 81.6 kg   SpO2 100%   BMI 30.90 kg/m   Physical Exam Vitals and nursing note reviewed.  Constitutional:      General: She is not in acute distress.    Appearance: She is well-developed. She is obese.     Comments: Patient is tearful appears uncomfortable  HENT:     Head: Atraumatic.  Eyes:     Conjunctiva/sclera: Conjunctivae normal.   Cardiovascular:     Rate and Rhythm: Normal rate and regular rhythm.  Pulmonary:     Effort: Pulmonary effort is normal.  Abdominal:     Tenderness: There is generalized abdominal tenderness and tenderness in the left lower quadrant. There is guarding.  Musculoskeletal:     Cervical back: Neck supple.  Skin:    Findings: No rash.  Neurological:     Mental Status: She is alert.  Psychiatric:        Mood and Affect: Mood normal.     (all labs ordered are listed, but only abnormal results are displayed) Labs Reviewed  CBC WITH DIFFERENTIAL/PLATELET - Abnormal; Notable for the following components:      Result Value   WBC 13.5 (*)    Hemoglobin 11.5 (*)    HCT 35.8 (*)    RDW 16.5 (*)    Platelets 551 (*)    Neutro Abs 10.3 (*)    All other components within normal limits  COMPREHENSIVE METABOLIC PANEL WITH GFR - Abnormal; Notable for the following components:   Glucose, Bld 111 (*)    Creatinine, Ser 1.18 (*)    GFR, Estimated 53 (*)    All other components within normal limits  URINALYSIS, ROUTINE W REFLEX MICROSCOPIC - Abnormal; Notable for the following components:   Protein, ur 100 (*)    Leukocytes,Ua TRACE (*)    Bacteria, UA RARE (*)    All other components within normal limits  LIPASE, BLOOD  I-STAT CG4 LACTIC ACID, ED  I-STAT CG4 LACTIC ACID, ED    EKG: None  Radiology: CT ABDOMEN PELVIS W CONTRAST Result Date: 10/21/2023 CLINICAL DATA:  Diverticulitis, complication suspected prior hx of diverticulitis with worsening pain - evaluation for perforation EXAM: CT ABDOMEN AND PELVIS WITH CONTRAST TECHNIQUE: Multidetector CT imaging of the abdomen and pelvis was performed using the standard protocol following bolus administration of intravenous contrast. RADIATION DOSE REDUCTION: This exam was performed according to the departmental dose-optimization program which includes automated exposure control, adjustment of the mA and/or kV according to patient size and/or use  of iterative reconstruction technique. CONTRAST:  OMNIPAQUE  IOHEXOL  300 MG/ML  SOLN COMPARISON:  CT abdomen pelvis 10/07/2023 FINDINGS: Lower chest: Bibasilar atelectasis. Hepatobiliary: The hepatic parenchyma is diffusely hypodense compared to the splenic parenchyma consistent with fatty infiltration. No focal liver abnormality. Status post cholecystectomy. No intrahepatic biliary dilatation. Mild common bile duct wall dilatation which can be seen in the post cholecystectomy setting. Pancreas: No focal lesion. Normal pancreatic contour. No surrounding inflammatory changes. No main pancreatic ductal dilatation. Spleen: Normal in size without focal abnormality. Adrenals/Urinary Tract: No adrenal nodule bilaterally. Bilateral kidneys enhance symmetrically. Fluid density lesions of the bilateral kidneys likely represent simple renal cysts. Simple renal cysts, in the absence of clinically indicated  signs/symptoms, require no independent follow-up. No hydronephrosis. No hydroureter.  No nephroureterolithiasis. The urinary bladder is unremarkable. On delayed imaging, there is no urothelial wall thickening and there are no filling defects in the opacified portions of the bilateral collecting systems or ureters. Stomach/Bowel: Stomach is within normal limits. No evidence of small bowel wall thickening or dilatation. Colonic diverticulosis. Question mild bowel wall thickening and pericolonic fat stranding along the focal proximal sigmoid colon diverticula (2:54). Appendix appears normal. Vascular/Lymphatic: No abdominal aorta or iliac aneurysm. Severe atherosclerotic plaque of the aorta and its branches. No abdominal, pelvic, or inguinal lymphadenopathy. Reproductive: Status post hysterectomy. No adnexal masses. Other: No intraperitoneal free fluid. No intraperitoneal free gas. No organized fluid collection. Musculoskeletal: Tiny fat containing supraumbilical ventral wall hernia (9:105). No suspicious lytic or blastic  osseous lesions. No acute displaced fracture. IMPRESSION: 1. Colonic diverticulosis with question developing mild proximal sigmoid colon uncomplicated acute diverticulitis. Recommend colonoscopy status post treatment and status post complete resolution of inflammatory changes to exclude an underlying lesion. 2.  Status post cholecystectomy. 3. Tiny fat containing supraumbilical ventral wall hernia. These images were dictated once made available on PACS. Electronically Signed   By: Morgane  Naveau M.D.   On: 10/21/2023 02:21     Procedures   Medications Ordered in the ED  HYDROmorphone  (DILAUDID ) injection 1 mg (1 mg Intravenous Given 10/22/23 1048)  ciprofloxacin  (CIPRO ) IVPB 400 mg (0 mg Intravenous Stopped 10/22/23 1241)  metroNIDAZOLE  (FLAGYL ) IVPB 500 mg (0 mg Intravenous Stopped 10/22/23 1315)  ondansetron  (ZOFRAN ) injection 4 mg (4 mg Intravenous Given 10/22/23 1052)  HYDROmorphone  (DILAUDID ) injection 1 mg (1 mg Intravenous Given 10/22/23 1241)                                    Medical Decision Making Amount and/or Complexity of Data Reviewed Labs: ordered.  Risk Prescription drug management.   BP 132/83 (BP Location: Left Arm)   Pulse 82   Temp 97.9 F (36.6 C) (Oral)   Resp 20   Ht 5' 4 (1.626 m)   Wt 81.6 kg   SpO2 100%   BMI 30.90 kg/m   50:52 AM  58 year old female with history of diverticular disease, hypertension, renal mass, GERD, polysubstance use including alcohol and cocaine use, intractable nausea and vomiting brought here via EMS from PCP office for complaint of abdominal pain.  Patient states for the past 4 to 5 days she has had persistent left lower quadrant abdominal pain.  Described pain as a sharp crampy achy sensation moderate to severe with associated nausea and vomiting.  She did notice some blood in his stool a few days ago.  She denies fever or chills denies any urinary symptoms.  She has been seen and evaluated multiple times in the ED for her complaint  within the past few days and had a CT scan that show evidence of diverticulitis.  She was prescribed Cipro  and Flagyl  yesterday and states despite taking antibiotic her symptoms is not getting better.  She was seen at  her PCP office who sent patient here for further care.  On exam patient is tearful uncomfortable and does have tenderness to gentle palpation of abdomen with guarding.  Tenderness more significant to the left lower quadrant.  Vital sign overall reassuring no fever no hypoxia. Surgery team is aware of the patient's complaint and request medicine for admission for further management of her diverticulitis.  Patient  has had 2 abdominal pelvis CT scan done for her complaint most recent was done yesterday showing mild diverticulitis without signs of perforation or abscess.  Do not think additional CT scan is warranted at this time.  -Labs ordered, independently viewed and interpreted by me.  Labs remarkable for white count of 13.5 slightly improved from previous.  Normal lactic acid.  AKI, IV fluid given -The patient was maintained on a cardiac monitor.  I personally viewed and interpreted the cardiac monitored which showed an underlying rhythm of: Sinus rhythm -Imaging independently viewed and interpreted by me and I agree with radiologist's interpretation.  Result remarkable for CT scan of the abdomen pelvis that was done yesterday show evidence of mild diverticulitis without signs of perforation or abscess -This patient presents to the ED for concern of abdominal pain, this involves an extensive number of treatment options, and is a complaint that carries with it a high risk of complications and morbidity.  The differential diagnosis includes diverticulitis, colitis, UTI, pyelonephritis, appendicitis, pancreatitis -Co morbidities that complicate the patient evaluation includes diverticular disease, hypertension, renal mass -Treatment includes Dilaudid , Flagyl , Cipro , IV fluid -Reevaluation  of the patient after these medicines showed that the patient improved -PCP office notes or outside notes reviewed -Discussion with specialist [facility] hospitalist Dr. Tobie who agrees to admit patient -Escalation to admission/observation considered: patient agreeable with admission      Final diagnoses:  Acute diverticulitis    ED Discharge Orders     None          Nivia Colon, PA-C 10/22/23 1337    Yolande Lamar BROCKS, MD 10/23/23 1001

## 2023-10-22 NOTE — Progress Notes (Signed)
 Pharmacy Antibiotic Note  Natalie Edwards is a 58 y.o. female admitted on 10/22/2023 with intra-abd infection.  Pharmacy has been consulted for zosyn  dosing.  Zosyn  ordered for diverticulitis.  Scr 1.18  Plan: Zosyn  3.375g IV q8 Rx signs off  Height: 5' 4 (162.6 cm) Weight: 81.6 kg (180 lb) IBW/kg (Calculated) : 54.7  Temp (24hrs), Avg:98 F (36.7 C), Min:97.8 F (36.6 C), Max:98.3 F (36.8 C)  Recent Labs  Lab 10/20/23 1940 10/22/23 1041 10/22/23 1249  WBC 17.8* 13.5*  --   CREATININE 1.03* 1.18*  --   LATICACIDVEN  --   --  0.7    Estimated Creatinine Clearance: 53.1 mL/min (A) (by C-G formula based on SCr of 1.18 mg/dL (H)).    Allergies  Allergen Reactions   Hydroxyzine  Palpitations and Other (See Comments)    Jitteriness, too   Nsaids Other (See Comments)    History of ulcers   Ambien  [Zolpidem  Tartrate] Other (See Comments)    Jitteriness, nervousness, abdominal pain   Ibuprofen  Other (See Comments)    History of ulcers    Antimicrobials this admission: 8/6 cipro >>8/7 8/6 flagyl >>8/7 8/7 zosyn >>  Dose adjustments this admission:   Microbiology results:  Sergio Batch, PharmD, BCIDP, AAHIVP, CPP Infectious Disease Pharmacist 10/22/2023 5:31 PM

## 2023-10-23 DIAGNOSIS — K5792 Diverticulitis of intestine, part unspecified, without perforation or abscess without bleeding: Secondary | ICD-10-CM | POA: Diagnosis not present

## 2023-10-23 LAB — COMPREHENSIVE METABOLIC PANEL WITH GFR
ALT: 44 U/L (ref 0–44)
AST: 50 U/L — ABNORMAL HIGH (ref 15–41)
Albumin: 3.2 g/dL — ABNORMAL LOW (ref 3.5–5.0)
Alkaline Phosphatase: 67 U/L (ref 38–126)
Anion gap: 15 (ref 5–15)
BUN: 9 mg/dL (ref 6–20)
CO2: 23 mmol/L (ref 22–32)
Calcium: 9.1 mg/dL (ref 8.9–10.3)
Chloride: 104 mmol/L (ref 98–111)
Creatinine, Ser: 1.08 mg/dL — ABNORMAL HIGH (ref 0.44–1.00)
GFR, Estimated: 59 mL/min — ABNORMAL LOW (ref >60)
Glucose, Bld: 83 mg/dL (ref 70–99)
Potassium: 3.9 mmol/L (ref 3.5–5.1)
Sodium: 142 mmol/L (ref 135–145)
Total Bilirubin: 0.5 mg/dL (ref 0.0–1.2)
Total Protein: 6.8 g/dL (ref 6.5–8.1)

## 2023-10-23 LAB — RAPID URINE DRUG SCREEN, HOSP PERFORMED
Amphetamines: NOT DETECTED
Barbiturates: NOT DETECTED
Benzodiazepines: POSITIVE — AB
Cocaine: NOT DETECTED
Opiates: POSITIVE — AB
Tetrahydrocannabinol: NOT DETECTED

## 2023-10-23 LAB — CBC
HCT: 33 % — ABNORMAL LOW (ref 36.0–46.0)
Hemoglobin: 10.7 g/dL — ABNORMAL LOW (ref 12.0–15.0)
MCH: 26.4 pg (ref 26.0–34.0)
MCHC: 32.4 g/dL (ref 30.0–36.0)
MCV: 81.5 fL (ref 80.0–100.0)
Platelets: 502 K/uL — ABNORMAL HIGH (ref 150–400)
RBC: 4.05 MIL/uL (ref 3.87–5.11)
RDW: 16.9 % — ABNORMAL HIGH (ref 11.5–15.5)
WBC: 11.9 K/uL — ABNORMAL HIGH (ref 4.0–10.5)
nRBC: 0 % (ref 0.0–0.2)

## 2023-10-23 LAB — HIV ANTIBODY (ROUTINE TESTING W REFLEX): HIV Screen 4th Generation wRfx: NONREACTIVE

## 2023-10-23 MED ORDER — PREGABALIN 75 MG PO CAPS: 75.0000 mg | ORAL_CAPSULE | Freq: Two times a day (BID) | ORAL | 0 refills | Status: DC

## 2023-10-23 MED ORDER — BUSPIRONE HCL 5 MG PO TABS
10.0000 mg | ORAL_TABLET | Freq: Two times a day (BID) | ORAL | Status: DC
  Administered 2023-10-23 – 2023-10-30 (×20): 10 mg via ORAL
  Filled 2023-10-23 (×14): qty 2

## 2023-10-23 NOTE — Progress Notes (Signed)
 PROGRESS NOTE    Natalie Edwards  FMW:[id number] DOB: 11-21-1965 DOA: 10/22/2023 PCP: Tanda Bleacher, MD   Brief Narrative:  58 year old female with history of diverticular disease with recent recurrent diverticulitis treated with few rounds of antibiotics, hypertension, renal mass, GERD/peptic ulcer disease, polysubstance use including alcohol and cocaine use, major depressive disorder and suicide attempts presented with worsening abdominal pain with recent multiple visits to the ED in the last few months for abdominal pain.  On presentation, WBC was 13.5.  CT of the abdomen and pelvis with contrast on 10/21/2023 had shown diverticulosis with possible acute sigmoid diverticulitis.  She was started on IV fluids and antibiotics.  Assessment & Plan:   Recurrent acute diverticulitis -Patient has had treatment with few rounds of antibiotics for recurrent diverticulitis over the last few months and was supposed to be referred to general surgery as an outpatient. -Presented with worsening abdominal pain and CT recently was as above. -Still complains of significant abdominal pain with no improvement.  Continue IV fluids and antibiotics.  I have consulted general surgery.  Follow recommendations  Polysubstance use including alcohol and cocaine abuse - Check urine drug screen. - Showing no signs of withdrawal.  Currently on CIWA protocol.  Continue multivitamin, thiamine  and folic acid   Major depressive disorder with history of suicidal ideation -Continue buspirone , fluoxetine , risperidone .  Patient looks very depressed.  Will consult psychiatry  Tobacco abuse -Will need counseling once clinically more stable  GERD/peptic ulcer disease - Continue IV Protonix   Leukocytosis -Improving.  Monitor  Anemia of chronic disease - From chronic illnesses.  Hemoglobin stable.  Monitor intermittently  Thrombocytosis -Mostly reactive.  Monitor intermittently  Mildly elevated AST -Question cause.  Monitor  intermittently.  Obesity - Outpatient follow-up   DVT prophylaxis: Heparin  subcutaneous Code Status: Full Family Communication: None at bedside Disposition Plan: Status is: Inpatient Remains inpatient appropriate because: Of severity of illness    Consultants: General Surgery/psychiatry  Procedures: None  Antimicrobials: Zosyn  from 10/22/2023 onwards   Subjective: Patient seen and examined at bedside.  Does not feel any better and continues to have abdominal pain with nausea.  No fever, chest pain or shortness of breath reported.  Objective: Vitals:   10/22/23 1729 10/22/23 2004 10/23/23 0549 10/23/23 0741  BP: (!) 151/81 (!) 151/76 105/60 132/72  Pulse: 88 75 80 82  Resp:  18 16   Temp: 98.2 F (36.8 C) 98.1 F (36.7 C) 98.2 F (36.8 C) 98.1 F (36.7 C)  TempSrc: Oral Oral Oral Oral  SpO2: 96% 93% 93% 92%  Weight:      Height:        Intake/Output Summary (Last 24 hours) at 10/23/2023 0904 Last data filed at 10/23/2023 9371 Gross per 24 hour  Intake 974.05 ml  Output 250 ml  Net 724.05 ml   Filed Weights   10/22/23 1011  Weight: 81.6 kg    Examination:  General exam: Appears calm and comfortable.  Looks chronically ill and deconditioned. Respiratory system: Bilateral decreased breath sounds at bases, no wheezing Cardiovascular system: S1 & S2 heard, Rate controlled Gastrointestinal system: Abdomen is obese, nondistended, soft and not in the left lower quadrant.  Normal bowel sounds heard. Extremities: No cyanosis, clubbing, edema  Central nervous system: Alert and oriented.  Slow to respond.  Poor historian.  No focal neurological deficits. Moving extremities Skin: No rashes, lesions or ulcers Psychiatry: Looks anxious.  Currently not agitated.    Data Reviewed: I have personally reviewed following labs and imaging studies  CBC: Recent Labs  Lab 10/20/23 1940 10/22/23 1041 10/23/23 0610  WBC 17.8* 13.5* 11.9*  NEUTROABS 12.8* 10.3*  --   HGB  11.3* 11.5* 10.7*  HCT 34.7* 35.8* 33.0*  MCV 81.6 81.2 81.5  PLT 492* 551* 502*   Basic Metabolic Panel: Recent Labs  Lab 10/20/23 1940 10/22/23 1041 10/23/23 0610  NA 140 137 142  K 4.4 3.9 3.9  CL 102 102 104  CO2 24 25 23   GLUCOSE 97 111* 83  BUN 15 11 9   CREATININE 1.03* 1.18* 1.08*  CALCIUM  9.1 9.4 9.1   GFR: Estimated Creatinine Clearance: 58 mL/min (A) (by C-G formula based on SCr of 1.08 mg/dL (H)). Liver Function Tests: Recent Labs  Lab 10/20/23 1940 10/22/23 1041 10/23/23 0610  AST 28 31 50*  ALT 32 33 44  ALKPHOS 79 73 67  BILITOT 0.4 0.2 0.5  PROT 7.4 7.6 6.8  ALBUMIN 3.4* 3.6 3.2*   Recent Labs  Lab 10/20/23 1940 10/22/23 1041  LIPASE 35 29   No results for input(s): AMMONIA in the last 168 hours. Coagulation Profile: No results for input(s): INR, PROTIME in the last 168 hours. Cardiac Enzymes: No results for input(s): CKTOTAL, CKMB, CKMBINDEX, TROPONINI in the last 168 hours. BNP (last 3 results) No results for input(s): PROBNP in the last 8760 hours. HbA1C: No results for input(s): HGBA1C in the last 72 hours. CBG: No results for input(s): GLUCAP in the last 168 hours. Lipid Profile: No results for input(s): CHOL, HDL, LDLCALC, TRIG, CHOLHDL, LDLDIRECT in the last 72 hours. Thyroid  Function Tests: No results for input(s): TSH, T4TOTAL, FREET4, T3FREE, THYROIDAB in the last 72 hours. Anemia Panel: No results for input(s): VITAMINB12, FOLATE, FERRITIN, TIBC, IRON , RETICCTPCT in the last 72 hours. Sepsis Labs: Recent Labs  Lab 10/22/23 1249  LATICACIDVEN 0.7    No results found for this or any previous visit (from the past 240 hours).       Radiology Studies: No results found.      Scheduled Meds:  busPIRone   7.5 mg Oral BID   FLUoxetine   20 mg Oral Daily   folic acid   1 mg Oral Daily   heparin   5,000 Units Subcutaneous Q12H   multivitamin with minerals  1 tablet Oral  Daily   nicotine   21 mg Transdermal Daily   pantoprazole  (PROTONIX ) IV  40 mg Intravenous Q12H   risperiDONE   1 mg Oral BID   sodium chloride  flush  3 mL Intravenous Q12H   thiamine   100 mg Oral Daily   Or   thiamine   100 mg Intravenous Daily   Continuous Infusions:  lactated ringers  75 mL/hr at 10/22/23 1821   piperacillin -tazobactam (ZOSYN )  IV 3.375 g (10/23/23 0736)          Sophie Mao, MD [facility] 10/23/2023, 9:04 AM

## 2023-10-23 NOTE — TOC CAGE-AID Note (Signed)
 Transition of Care [facility]) - CAGE-AID Screening   Patient Details  Name: Natalie Edwards MRN: [id number] Date of Birth: 1965/04/17  Transition of [facility]) CM/SW Contact:    Lendia Dais, LCSW Phone Number: 10/23/2023, 2:53 PM   Clinical Narrative: CSW informed patient of TOC consult of substance use. Pt denied any illicit substance use. CSW explained that due to consult, there were still some generic questions that had to be asked. Pt admitted to previously using substances and drinking once a month. Pt stated that they received treatment for substance use in that past but did not remember where. CSW offered substance use resources and the pt declined.    CAGE-AID Screening:    Have You Ever Felt You Ought to Cut Down on Your Drinking or Drug Use?: Yes Have People Annoyed You By Critizing Your Drinking Or Drug Use?: No Have You Felt Bad Or Guilty About Your Drinking Or Drug Use?: Yes Have You Ever Had a Drink or Used Drugs First Thing In The Morning to Steady Your Nerves or to Get Rid of a Hangover?: No CAGE-AID Score: 2  Substance Abuse Education Offered: Yes  Substance abuse interventions: Other (must comment), SDOH Screening (Pt denied resources for substance use)

## 2023-10-23 NOTE — Consult Note (Signed)
 [facility] Psychiatric Consult Initial  Patient Name: .Natalie Edwards  MRN: [id number]  DOB: 1966/02/01  Consult Order details:  Orders (From admission, onward)     Start     Ordered   10/23/23 0917  IP CONSULT TO PSYCHIATRY       Ordering Provider: Cheryle Page, MD  Provider:  (Not yet assigned)  Question Answer Comment  Location [facility]   Reason for Consult? severe depression      10/23/23 0917             Mode of Visit: In person    Psychiatry Consult Evaluation  Service Date: October 23, 2023 LOS:  LOS: 1 day  Reason for Consult: severe depression  Primary Psychiatric Diagnoses  Adjustment disorder with anxious mood  Assessment  Natalie Edwards is a 58 y.o. female admitted: Presented to the ED on 10/22/2023 10:02 AM for abdominal pain. She carries the psychiatric diagnoses of GAD w/ panic attacks and MDD. Past medical history of HTN, Puptic ulcer disease, diverticulitis, prediabetes, cocaine abuse, and alcohol abuse.   Her current presentation of anxiety is most consistent with adjustment disorder secondary to her being in the hospital. Current outpatient psychotropic medications include buspar  and prozac  and historically she has had a good response to these medications. She was reports compliance with medications prior to admission. Please see plan below for detailed recommendations.   Diagnoses:  Active Hospital problems: Principal Problem:   Diverticulitis Active Problems:   Iron  deficiency anemia due to chronic blood loss   GAD (generalized anxiety disorder)   Essential hypertension   Anxiety and depression   Prediabetes   AKI (acute kidney injury) (HCC)   Cocaine abuse (HCC)   Alcohol abuse   Abdominal pain    Plan   ## Psychiatric Medication Recommendations:  -- Stopping Risperdal  (pt denies taking it, and chart review says it is not necessary. No clear reason to restart it) -- Increasing Buspar  to 10mg  BID from 7.5mg  BID for  worsened anxiety. May reduce back to 7.5mg  after discharge.  ## Medical Decision Making Capacity: Not specifically addressed in this encounter  ## Further Work-up:  -- most recent EKG on 7/23 had QtC of 430 -- Pertinent labwork reviewed earlier this admission. UDS is pending.   ## Disposition:-- There are no psychiatric contraindications to discharge at this time  ## Behavioral / Environmental: - No specific recommendations at this time.     ## Safety and Observation Level:  - Based on my clinical evaluation, I estimate the patient to be at low risk of self harm in the current setting. - At this time, we recommend  routine monitoring. This decision is based on my review of the chart including patient's history and current presentation, interview of the patient, mental status examination, and consideration of suicide risk including evaluating suicidal ideation, plan, intent, suicidal or self-harm behaviors, risk factors, and protective factors. This judgment is based on our ability to directly address suicide risk, implement suicide prevention strategies, and develop a safety plan while the patient is in the clinical setting. Please contact our team if there is a concern that risk level has changed.  CSSR Risk Category:C-SSRS RISK CATEGORY: No Risk  Suicide Risk Assessment: Patient has following modifiable risk factors for suicide: current symptoms: anxiety. Patient has following non-modifiable or demographic risk factors for suicide: history of suicide attempt Patient has the following protective factors against suicide: Supportive friends  Thank you for this consult request. Recommendations have been  communicated to the primary team.  We will sign off at this time.   Penne Mori, DO       History of Present Illness  Natalie Edwards is a 58 y.o. female admitted: Presented to the ED on 10/22/2023 10:02 AM for abdominal pain. She carries the psychiatric diagnoses of GAD w/ panic attacks  and MDD. Past medical history of HTN, Puptic ulcer disease, diverticulitis, prediabetes, cocaine abuse, and alcohol abuse.   Psychiatrically, she has a history of GAD and MDD, and has been seeing psychiatrists at [facility] and feels that her depression is being well treated, but she is still experiencing panic attacks. Reports that she is not taking Risperdal .   Pt expresses some distress from the constant pain that she has been in, and is glad to be going into surgery for a hopeful remedy to her pain.  Patient Report:  10/23/2023, pt feels some anxiety for the upcoming surgery, but denies depression, SI, HI, A&VH.  Psych ROS:  Depression is reported as none which she attributes to her outpatient care. Denies concerns with sleep, energy, concentration, and appetite. Denies SI. Reports having panic attacks with associated thoughts of doom, but no suicidal ideations, intentions, or plans even during these times. Anxiety is marked with panic attacks, maybe once a week which she is working on with her outpt provider.  Substance use denied, citing the 4th of July as the day of her last drink of a glass of wine, and denies all other substances.  Denies A&VH Denies access to guns in the home.  Collateral information:  Called [facility] twice for clarification on records, did not answer.  Review of Systems  Respiratory: Negative.    Cardiovascular: Negative.   Gastrointestinal: Negative.   Psychiatric/Behavioral:  Negative for depression, hallucinations, substance abuse and suicidal ideas. The patient is nervous/anxious.      Psychiatric and Social History  Psychiatric History:  Admitted to Geriatric psych from 3/11 - 3/27 for MDD, cocaine abuse, and suicide attempt.   Social History:  No gun access Cocaine and ETOH abuse history  Exam Findings   Vital Signs:  Temp:  [97.8 F (36.6 C)-98.3 F (36.8 C)] 98.1 F (36.7 C) (08/08 0741) Pulse Rate:  [75-88] 82 (08/08  0741) Resp:  [14-18] 16 (08/08 0549) BP: (105-151)/(60-81) 132/72 (08/08 0741) SpO2:  [91 %-99 %] 92 % (08/08 0741) Blood pressure 132/72, pulse 82, temperature 98.1 F (36.7 C), temperature source Oral, resp. rate 16, height 5' 4 (1.626 m), weight 81.6 kg, SpO2 92%. Body mass index is 30.9 kg/m.  Physical Exam Vitals reviewed.  Constitutional:      General: She is not in acute distress.    Appearance: She is normal weight.  Pulmonary:     Effort: Pulmonary effort is normal. No respiratory distress.  Neurological:     Mental Status: She is alert and oriented to person, place, and time.    Mental Status Exam  Apperance: Appropriate for environment and Laying in bed Behavior: Calm Speech: Normal Rate, Articulate, Normal Volume, and Responsive Attitude: Cooperative and Friendly Mood: fine Affect: Euthymic, Normal Range, and Mood Congruent Perception: Not responding to internal stimuli Thought Content: within normal limits Thought Form: Goal Directed, Organized, Linear, and Logical Cognition: Alert & Oriented to person, place, and time, Recent and Remote memory grossly intact by recounting personal history, Immediate memory grossly intact by interview, and remote memory intact by naming past 3 presidents Judgment: Fair Insight: Fair  Key Points: Denies SI, HI,  A&VH    Other History   These have been pulled in through the EMR, reviewed, and updated if appropriate.  Family History:  The patient's family history includes Cancer in her maternal uncle, maternal uncle, and maternal uncle.  Medical History: Past Medical History:  Diagnosis Date   Allergy    Anemia    Diverticulosis    Gall bladder stones 1993   Gastric ulcer    GERD (gastroesophageal reflux disease)    Hypertension    Renal mass     Surgical History: Past Surgical History:  Procedure Laterality Date   ABDOMINAL HYSTERECTOMY     BIOPSY  09/29/2022   Procedure: BIOPSY;  Surgeon: Shila Gustav GAILS,  MD;  Location: [facility];  Service: Gastroenterology;;   CHOLECYSTECTOMY     COLONOSCOPY WITH ESOPHAGOGASTRODUODENOSCOPY (EGD)  04/07/2023   [facility]   ESOPHAGOGASTRODUODENOSCOPY (EGD) WITH PROPOFOL  N/A 09/29/2022   Procedure: ESOPHAGOGASTRODUODENOSCOPY (EGD) WITH PROPOFOL ;  Surgeon: Shila Gustav GAILS, MD;  Location: [facility] ENDOSCOPY;  Service: Gastroenterology;  Laterality: N/A;     Medications:   Current Facility-Administered Medications:    acetaminophen  (TYLENOL ) tablet 650 mg, 650 mg, Oral, Q6H PRN, 650 mg at 10/22/23 1809 **OR** acetaminophen  (TYLENOL ) suppository 650 mg, 650 mg, Rectal, Q6H PRN, Tobie Mario GAILS, MD   busPIRone  (BUSPAR ) tablet 7.5 mg, 7.5 mg, Oral, BID, Patel, Ekta V, MD, 7.5 mg at 10/23/23 9048   FLUoxetine  (PROZAC ) capsule 20 mg, 20 mg, Oral, Daily, Patel, Ekta V, MD, 20 mg at 10/23/23 9047   folic acid  (FOLVITE ) tablet 1 mg, 1 mg, Oral, Daily, Tobie Mario V, MD, 1 mg at 10/23/23 9047   heparin  injection 5,000 Units, 5,000 Units, Subcutaneous, Q12H, Tobie Mario GAILS, MD, 5,000 Units at 10/23/23 9048   lactated ringers  infusion, , Intravenous, Continuous, Tobie Mario GAILS, MD, Last Rate: 75 mL/hr at 10/22/23 1821, New Bag at 10/22/23 1821   LORazepam  (ATIVAN ) tablet 1-4 mg, 1-4 mg, Oral, Q1H PRN **OR** LORazepam  (ATIVAN ) injection 1-4 mg, 1-4 mg, Intravenous, Q1H PRN, Tobie Mario GAILS, MD, 2 mg at 10/23/23 0734   morphine  (PF) 2 MG/ML injection 2 mg, 2 mg, Intravenous, Q4H PRN, Tobie Mario GAILS, MD, 2 mg at 10/23/23 9047   multivitamin with minerals tablet 1 tablet, 1 tablet, Oral, Daily, Tobie Mario GAILS, MD, 1 tablet at 10/23/23 9048   nicotine  (NICODERM CQ  - dosed in mg/24 hours) patch 21 mg, 21 mg, Transdermal, Daily, Tobie Mario V, MD, 21 mg at 10/23/23 0950   pantoprazole  (PROTONIX ) injection 40 mg, 40 mg, Intravenous, Q12H, Tobie Mario V, MD, 40 mg at 10/23/23 0951   [COMPLETED] piperacillin -tazobactam (ZOSYN ) IVPB 3.375 g, 3.375 g, Intravenous, Once, Last Rate: 100 mL/hr  at 10/22/23 1823, 3.375 g at 10/22/23 1823 **FOLLOWED BY** piperacillin -tazobactam (ZOSYN ) IVPB 3.375 g, 3.375 g, Intravenous, Q8H, Pham, Minh Q, RPH-CPP, Last Rate: 12.5 mL/hr at 10/23/23 0736, 3.375 g at 10/23/23 0736   risperiDONE  (RISPERDAL ) tablet 1 mg, 1 mg, Oral, BID, Tobie Mario V, MD, 1 mg at 10/23/23 0025   sodium chloride  flush (NS) 0.9 % injection 3 mL, 3 mL, Intravenous, Q12H, Tobie Mario V, MD, 3 mL at 10/22/23 1441   thiamine  (VITAMIN B1) tablet 100 mg, 100 mg, Oral, Daily, 100 mg at 10/23/23 0952 **OR** thiamine  (VITAMIN B1) injection 100 mg, 100 mg, Intravenous, Daily, Tobie Mario GAILS, MD  Allergies: Allergies  Allergen Reactions   Hydroxyzine  Palpitations and Other (See Comments)    Jitteriness, too   Nsaids Other (See Comments)  History of ulcers   Ambien  [Zolpidem  Tartrate] Other (See Comments)    Jitteriness, nervousness, abdominal pain   Ibuprofen  Other (See Comments)    History of ulcers    Penne Mori, DO - PGY1

## 2023-10-23 NOTE — Progress Notes (Signed)
 CSW completed SDOH screening.  Housing - Pt stated that she has stable housing but struggles to pay rent. CSW asked if there have been anytimes if the patient was worried if they would not be able to pay their rent and the patient stated sometimes. CSW asked the patient if they could provide housing resources and the patient was agreeable.   Transportation - Pt stated that she does not have reliable transportation and used medicaid transportation.    Lendia Dais, LCSWA

## 2023-10-23 NOTE — Plan of Care (Signed)

## 2023-10-23 NOTE — Consult Note (Addendum)
 Natalie Edwards February 02, 1966  [id number].    Requesting MD: Sophie Mao Chief Complaint/Reason for Consult: Abdominal pain   HPI: Natalie Edwards is a 58 y.o. female with PMHx significant for diverticular disease with recent recurrent diverticulitis which was treated with a few rounds of antibiotics, hypertension, renal mass, GERD, peptic ulcer disease who presents with worsening left lower quadrant abdominal pain accompanied by nausea and vomiting after multiple visits to the ED in the last few months for the same concerns. She denies having constipation or diarrhea. She denies any bloody emesis, dark stools or bright red blood per rectum. Patient stated that she visited the outpatient office of [facility] yesterday because the pain became unbearable and was advised to return to the ED for further evaluation.  Patient endorses that she has had prior abdominal surgeries including cholecystectomy and hysterectomy. She denied the use of blood thinners. She endorses the use of alcohol and tobacco but denies the use of illicit substances.   ROS: As per HPI   Family History  Problem Relation Age of Onset   Cancer Maternal Uncle        Lung   Cancer Maternal Uncle        Lung   Cancer Maternal Uncle        Lung   Headache Neg Hx        I don't think so   Migraines Neg Hx        I don't think so    Past Medical History:  Diagnosis Date   Allergy    Anemia    Diverticulosis    Gall bladder stones 1993   Gastric ulcer    GERD (gastroesophageal reflux disease)    Hypertension    Renal mass     Past Surgical History:  Procedure Laterality Date   ABDOMINAL HYSTERECTOMY     BIOPSY  09/29/2022   Procedure: BIOPSY;  Surgeon: Shila Gustav GAILS, MD;  Location: [facility]OS[facility];  Service: Gastroenterology;;   CHOLECYSTECTOMY     COLONOSCOPY WITH ESOPHAGOGASTRODUODENOSCOPY (EGD)  04/07/2023   [facility]   ESOPHAGOGASTRODUODENOSCOPY (EGD) WITH PROPOFOL  N/A 09/29/2022    Procedure: ESOPHAGOGASTRODUODENOSCOPY (EGD) WITH PROPOFOL ;  Surgeon: Shila Gustav GAILS, MD;  Location: [facility] ENDOSCOPY;  Service: Gastroenterology;  Laterality: N/A;    Social History:  reports that she has been smoking cigarettes. She has a 12.5 pack-year smoking history. She has never used smokeless tobacco. She reports current alcohol use. She reports that she does not use drugs.  Allergies:  Allergies  Allergen Reactions   Hydroxyzine  Palpitations and Other (See Comments)    Jitteriness, too   Nsaids Other (See Comments)    History of ulcers   Ambien  [Zolpidem  Tartrate] Other (See Comments)    Jitteriness, nervousness, abdominal pain   Ibuprofen  Other (See Comments)    History of ulcers    Medications Prior to Admission  Medication Sig Dispense Refill   ciprofloxacin  (CIPRO ) 500 MG tablet Take 1 tablet (500 mg total) by mouth every 12 (twelve) hours. (Patient taking differently: Take 500 mg by mouth every 12 (twelve) hours. Until 10/26/2023) 10 tablet 0   HYDROcodone -acetaminophen  (NORCO/VICODIN) 5-325 MG tablet Take 2 tablets by mouth every 4 (four) hours as needed. 10 tablet 0   metroNIDAZOLE  (FLAGYL ) 500 MG tablet Take 1 tablet (500 mg total) by mouth 2 (two) times daily for 5 days. 10 tablet 0   busPIRone  (BUSPAR ) 7.5 MG tablet Take 7.5 mg by mouth 2 (two) times  daily. (Patient not taking: Reported on 10/23/2023)     dicyclomine  (BENTYL ) 20 MG tablet Take 1 tablet (20 mg total) by mouth 2 (two) times daily. (Patient not taking: Reported on 10/23/2023) 20 tablet 0   FLUoxetine  (PROZAC ) 20 MG capsule Take 20 mg by mouth daily. (Patient not taking: Reported on 10/23/2023)     gabapentin  (NEURONTIN ) 300 MG capsule Take 1 capsule (300 mg total) by mouth 2 (two) times daily. (Patient not taking: Reported on 10/23/2023) 60 capsule 1   mirtazapine  (REMERON ) 45 MG tablet Take 45 mg by mouth at bedtime. (Patient not taking: Reported on 10/23/2023)     naphazoline-glycerin  (CLEAR EYES REDNESS)  0.012-0.25 % SOLN Place 1-2 drops into both eyes 4 (four) times daily as needed for eye irritation. (Patient not taking: Reported on 10/23/2023) 1 mL 0   nicotine  (NICODERM CQ  - DOSED IN MG/24 HOURS) 21 mg/24hr patch Place 1 patch (21 mg total) onto the skin daily. (Patient not taking: Reported on 10/23/2023) 28 patch 0   pantoprazole  (PROTONIX ) 40 MG tablet Take 1 tablet (40 mg total) by mouth 2 (two) times daily. (Patient not taking: Reported on 10/23/2023) 30 tablet 2   pregabalin  (LYRICA ) 75 MG capsule Take 75 mg by mouth 2 (two) times daily. (Patient not taking: Reported on 10/23/2023)     risperiDONE  (RISPERDAL ) 1 MG tablet Take 1 mg by mouth 2 (two) times daily. (Patient not taking: Reported on 10/23/2023)     temazepam  (RESTORIL ) 15 MG capsule Take 15 mg by mouth at bedtime as needed. (Patient not taking: Reported on 10/23/2023)       Physical Exam: Blood pressure 132/72, pulse 82, temperature 98.1 F (36.7 C), temperature source Oral, resp. rate 16, height 5' 4 (1.626 m), weight 81.6 kg, SpO2 92%.  Vitals reviewed.  Constitutional:      General: She is not in acute distress.    Appearance: She is obese. She is ill-appearing.  Cardiovascular:     Rate and Rhythm: Normal rate.  Pulmonary:     Effort: Pulmonary effort is normal.  Abdominal:     General: Abdomen is protuberant.     Palpations: Abdomen is soft.     Tenderness: There is exquisite abdominal tenderness in the periumbilical area, suprapubic area and left lower quadrant. There is no guarding or rebound.     Hernia: No hernia is present.  Skin:    General: Skin is warm and dry.  Neurological:     General: No focal deficit present.     Mental Status: She is alert and oriented to person, place, and time.    Results for orders placed or performed during the hospital encounter of 10/22/23 (from the past 48 hours)  CBC with Differential     Status: Abnormal   Collection Time: 10/22/23 10:41 AM  Result Value Ref Range   WBC 13.5  (H) 4.0 - 10.5 K/uL   RBC 4.41 3.87 - 5.11 MIL/uL   Hemoglobin 11.5 (L) 12.0 - 15.0 g/dL   HCT 64.1 (L) 63.9 - 53.9 %   MCV 81.2 80.0 - 100.0 fL   MCH 26.1 26.0 - 34.0 pg   MCHC 32.1 30.0 - 36.0 g/dL   RDW 83.4 (H) 88.4 - 84.4 %   Platelets 551 (H) 150 - 400 K/uL   nRBC 0.0 0.0 - 0.2 %   Neutrophils Relative % 76 %   Neutro Abs 10.3 (H) 1.7 - 7.7 K/uL   Lymphocytes Relative 16 %   Lymphs  Abs 2.2 0.7 - 4.0 K/uL   Monocytes Relative 7 %   Monocytes Absolute 0.9 0.1 - 1.0 K/uL   Eosinophils Relative 1 %   Eosinophils Absolute 0.1 0.0 - 0.5 K/uL   Basophils Relative 0 %   Basophils Absolute 0.0 0.0 - 0.1 K/uL   Immature Granulocytes 0 %   Abs Immature Granulocytes 0.05 0.00 - 0.07 K/uL    Comment: Performed at [facility], [address] [place], [place] [address]  Comprehensive metabolic panel     Status: Abnormal   Collection Time: 10/22/23 10:41 AM  Result Value Ref Range   Sodium 137 135 - 145 mmol/L   Potassium 3.9 3.5 - 5.1 mmol/L   Chloride 102 98 - 111 mmol/L   CO2 25 22 - 32 mmol/L   Glucose, Bld 111 (H) 70 - 99 mg/dL    Comment: Glucose reference range applies only to samples taken after fasting for at least 8 hours.   BUN 11 6 - 20 mg/dL   Creatinine, Ser 8.81 (H) 0.44 - 1.00 mg/dL   Calcium  9.4 8.9 - 10.3 mg/dL   Total Protein 7.6 6.5 - 8.1 g/dL   Albumin 3.6 3.5 - 5.0 g/dL   AST 31 15 - 41 U/L   ALT 33 0 - 44 U/L   Alkaline Phosphatase 73 38 - 126 U/L   Total Bilirubin 0.2 0.0 - 1.2 mg/dL   GFR, Estimated 53 (L) >60 mL/min    Comment: (NOTE) Calculated using the CKD-EPI Creatinine Equation (2021)    Anion gap 10 5 - 15    Comment: Performed at [facility], [address], [place], [place] [address]  Lipase, blood     Status: None   Collection Time: 10/22/23 10:41 AM  Result Value Ref Range   Lipase 29 11 - 51 U/L    Comment: Performed at [facility], [address] [place], [place] [address]  Urinalysis, Routine w reflex microscopic  -Urine, Clean Catch     Status: Abnormal   Collection Time: 10/22/23 10:41 AM  Result Value Ref Range   Color, Urine YELLOW YELLOW   APPearance CLEAR CLEAR   Specific Gravity, Urine 1.017 1.005 - 1.030   pH 6.0 5.0 - 8.0   Glucose, UA NEGATIVE NEGATIVE mg/dL   Hgb urine dipstick NEGATIVE NEGATIVE   Bilirubin Urine NEGATIVE NEGATIVE   Ketones, ur NEGATIVE NEGATIVE mg/dL   Protein, ur 899 (A) NEGATIVE mg/dL   Nitrite NEGATIVE NEGATIVE   Leukocytes,Ua TRACE (A) NEGATIVE   RBC / HPF 0-5 0 - 5 RBC/hpf   WBC, UA 0-5 0 - 5 WBC/hpf   Bacteria, UA RARE (A) NONE SEEN   Squamous Epithelial / HPF 0-5 0 - 5 /HPF   Mucus PRESENT     Comment: Performed at [facility], [address], [place], [place] [address]  I-Stat CG4 Lactic Acid     Status: None   Collection Time: 10/22/23 12:49 PM  Result Value Ref Range   Lactic Acid, Venous 0.7 0.5 - 1.9 mmol/L  Comprehensive metabolic panel     Status: Abnormal   Collection Time: 10/23/23  6:10 AM  Result Value Ref Range   Sodium 142 135 - 145 mmol/L   Potassium 3.9 3.5 - 5.1 mmol/L   Chloride 104 98 - 111 mmol/L   CO2 23 22 - 32 mmol/L   Glucose, Bld 83 70 - 99 mg/dL    Comment: Glucose reference  range applies only to samples taken after fasting for at least 8 hours.   BUN 9 6 - 20 mg/dL   Creatinine, Ser 8.91 (H) 0.44 - 1.00 mg/dL   Calcium  9.1 8.9 - 10.3 mg/dL   Total Protein 6.8 6.5 - 8.1 g/dL   Albumin 3.2 (L) 3.5 - 5.0 g/dL   AST 50 (H) 15 - 41 U/L   ALT 44 0 - 44 U/L   Alkaline Phosphatase 67 38 - 126 U/L   Total Bilirubin 0.5 0.0 - 1.2 mg/dL   GFR, Estimated 59 (L) >60 mL/min    Comment: (NOTE) Calculated using the CKD-EPI Creatinine Equation (2021)    Anion gap 15 5 - 15    Comment: Performed at [facility], [address], [place], [place] [address]  CBC     Status: Abnormal   Collection Time: 10/23/23  6:10 AM  Result Value Ref Range   WBC 11.9 (H) 4.0 - 10.5 K/uL   RBC 4.05 3.87 - 5.11 MIL/uL   Hemoglobin 10.7  (L) 12.0 - 15.0 g/dL   HCT 66.9 (L) 63.9 - 53.9 %   MCV 81.5 80.0 - 100.0 fL   MCH 26.4 26.0 - 34.0 pg   MCHC 32.4 30.0 - 36.0 g/dL   RDW 83.0 (H) 88.4 - 84.4 %   Platelets 502 (H) 150 - 400 K/uL   nRBC 0.0 0.0 - 0.2 %    Comment: Performed at [facility], [address], [place], [place] [address]   No results found.  Anti-infectives (From admission, onward)    Start     Dose/Rate Route Frequency Ordered Stop   10/23/23 0000  piperacillin -tazobactam (ZOSYN ) IVPB 3.375 g       Placed in Followed by Linked Group   3.375 g 12.5 mL/hr over 240 Minutes Intravenous Every 8 hours 10/22/23 1727     10/22/23 1815  piperacillin -tazobactam (ZOSYN ) IVPB 3.375 g       Placed in Followed by Linked Group   3.375 g 100 mL/hr over 30 Minutes Intravenous  Once 10/22/23 1727 10/22/23 1853   10/22/23 1030  ciprofloxacin  (CIPRO ) IVPB 400 mg        400 mg 200 mL/hr over 60 Minutes Intravenous  Once 10/22/23 1028 10/22/23 1241   10/22/23 1030  metroNIDAZOLE  (FLAGYL ) IVPB 500 mg        500 mg 100 mL/hr over 60 Minutes Intravenous  Once 10/22/23 1028 10/22/23 1315       Assessment/Plan Acute Uncomplicated Diverticulitis   58 y.o. female with PMHx significant for diverticular disease with recent recurrent diverticulitis which was treated with a few rounds of antibiotics, hypertension, renal mass, GERD, peptic ulcer disease who presents with worsening abdominal pain accompanied by nausea and vomiting after multiple visits to the ED in the last few months for the same concerns.   CT abd/pelvis shows colonic diverticulosis with question developing mild proximal sigmoid colon uncomplicated acute diverticulitis.  -WBC trending down, 11.9 K from 13.5 K yesterday -Based on imaging, labs and P/E, no immediate surgical intervention indicated at this time. Plan to treat patient conservatively for now and continue to monitor outcome. However, given that the patient has had multiple recurrence of  diverticulitis, it would not be unreasonable to explore the option bowel resection with colostomy. Will discuss case further with my attending for further recommendations. Patient is agreeable to surgery.   -Continue to monitor abdominal tenderness -Pain control  -NPO to promote bowel rest -Continue  IV abx  -Monitor CBC   FEN - NPO except for ice chips and sips with meds.  VTE - SubQ heparin   ID - Zosyn  Dispo - Med-surg.   I reviewed nursing notes, ED provider notes, hospitalist notes, last 24 h vitals and pain scores, last 48 h intake and output, last 24 h labs and trends, and last 24 h imaging results.   Eulah Hammonds, [facility] 10/23/2023, 9:13 AM Please see Amion for pager number during day hours 7:00am-4:30pm

## 2023-10-23 NOTE — TOC Initial Note (Signed)
 Transition of Care [facility]) - Initial/Assessment Note    Patient Details  Name: Natalie Edwards MRN: [id number] Date of Birth: [birth year]  [facility]) CM/SW Contact:    Lendia Dais, LCSW Phone Number: 10/23/2023, 3:07 PM  Clinical Narrative: CSW completed initial assessment. Pt is from home alone with no hx of HH. Pt emergency contact is Arlethia Basso (son) and the pt gave permission for the son to be contacted about any updates concerning their care.  Pt states that they are current receiving outpatient mental health services through [facility].    CSW will continue to monitor for dc needs.                 Expected Discharge Plan: Home/Self Care Barriers to Discharge: Continued Medical Work up   Patient Goals and CMS Choice Patient states their goals for this hospitalization and ongoing recovery are:: To go home          Expected Discharge Plan and Services In-house Referral: Clinical Social Work     Living arrangements for the past 2 months: Single Family Home                                      Prior Living Arrangements/Services Living arrangements for the past 2 months: Single Family Home Lives with:: Self Patient language and need for interpreter reviewed:: Yes Do you feel safe going back to the place where you live?: Yes      Need for Family Participation in Patient Care: Yes (Comment) Care giver support system in place?: Yes (comment)   Criminal Activity/Legal Involvement Pertinent to Current Situation/Hospitalization: No - Comment as needed  Activities of Daily Living   ADL Screening (condition at time of admission) Independently performs ADLs?: Yes (appropriate for developmental age) Is the patient deaf or have difficulty hearing?: No Does the patient have difficulty seeing, even when wearing glasses/contacts?: Yes Does the patient have difficulty concentrating, remembering, or making decisions?:  No  Permission Sought/Granted   Permission granted to share information with : Yes, Verbal Permission Granted  Share Information with NAME: Cerny,Darnell (Son)  [phone]           Emotional Assessment Appearance:: Appears stated age Attitude/Demeanor/Rapport: Engaged Affect (typically observed): Defensive Orientation: : Oriented to Self, Oriented to Place, Oriented to  Time, Oriented to Situation Alcohol / Substance Use: Alcohol Use Psych Involvement: Yes (comment) ([facility])  Admission diagnosis:  Abdominal pain [R10.9] Acute diverticulitis [K57.92] Patient Active Problem List   Diagnosis Date Noted   Abdominal pain 10/22/2023   MDD (major depressive disorder), recurrent episode, moderate (HCC) 10/08/2023   Alcohol abuse 10/08/2023   Suicidal ideations 10/08/2023   Pain in left shoulder 09/17/2023   Pain and swelling of lower extremity 05/28/2023   MDD (major depressive disorder), recurrent episode, severe (HCC) 05/26/2023   Ingestion of substance, intentional self-harm, sequela (HCC) 05/23/2023   SIRS (systemic inflammatory response syndrome) (HCC) 05/23/2023   Rhabdomyolysis 05/23/2023   Cocaine abuse (HCC) 05/23/2023   Intractable nausea and vomiting 01/18/2023   MDD (major depressive disorder) 12/23/2022   Grief 12/22/2022   Diverticulitis 10/17/2022   Melena 09/29/2022   Gastric ulcer without hemorrhage or perforation 09/29/2022   AKI (acute kidney injury) (HCC) 09/28/2022   Metabolic acidosis 09/28/2022   Epigastric pain 09/28/2022   Nausea 09/28/2022   Hyperlipidemia 12/14/2021   Prediabetes 12/14/2021  Anxiety and depression 05/14/2021   Breathing difficult 05/14/2021   GERD (gastroesophageal reflux disease)    Possible exposure to STD 01/14/2021   Chronic insomnia 10/18/2019   CKD (chronic kidney disease) 04/07/2019   GAD (generalized anxiety disorder) 05/07/2018   Chronic gastric ulcer 05/07/2018   Essential hypertension  05/07/2018   Iron  deficiency anemia due to chronic blood loss 09/23/2016   Peptic ulcer disease with hemorrhage 09/23/2016   Thrombocytosis 09/09/2016   Leukocytosis/thrombocytosis 09/09/2016   Gastric nodule 09/09/2016   H/O ulcer disease 07/31/2016   PCP:  Tanda Bleacher, MD Pharmacy:   [facility] [id number]), [place] - [address] [place] ([place]) [place] [address] Phone: [phone] Fax: [phone]  [facility] [address] [place] [address] Phone: [phone] Fax: [phone]     Social Drivers of Health (SDOH) Social History: SDOH Screenings   Food Insecurity: No Food Insecurity (10/22/2023)  Housing: High Risk (10/22/2023)  Transportation Needs: No Transportation Needs (10/22/2023)  Utilities: Patient Declined (10/22/2023)  Alcohol Screen: Low Risk  (05/26/2023)  Depression (PHQ2-9): High Risk (07/07/2023)  Financial Resource Strain: Low Risk  (03/06/2023)  Physical Activity: Sufficiently Active (05/22/2022)  Social Connections: Socially Isolated (05/26/2023)  Stress: Stress Concern Present (03/06/2023)  Tobacco Use: High Risk (10/22/2023)  Health Literacy: Adequate Health Literacy (07/07/2023)   SDOH Interventions: Housing Interventions: Community Resources Provided, Inpatient TOC   Readmission Risk Interventions    05/26/2023   10:15 AM 09/30/2022    3:47 PM  Readmission Risk Prevention Plan  Transportation Screening Complete Complete  PCP or Specialist Appt within 5-7 Days  Complete  Home Care Screening  Complete  Medication Review (RN CM)  Referral to Pharmacy  Medication Review (RN Care Manager) Complete   PCP or Specialist appointment within 3-5 days of discharge Complete   HRI or Home Care Consult Complete   SW Recovery Care/Counseling Consult Complete   Palliative Care Screening Not Applicable   Skilled Nursing Facility Not Applicable

## 2023-10-23 NOTE — Discharge Instructions (Addendum)
 [facility]      [facility], [place] [phone]  OPEN ABDOMINAL SURGERY: POST OP INSTRUCTIONS  Always review your discharge instruction sheet given to you by the facility where your surgery was performed.  IF YOU HAVE DISABILITY OR FAMILY LEAVE FORMS, YOU MUST BRING THEM TO THE OFFICE FOR PROCESSING.  PLEASE DO NOT GIVE THEM TO YOUR DOCTOR.  A prescription for pain medication may be given to you upon discharge.  Take your pain medication as prescribed, if needed.  If narcotic pain medicine is not needed, then you may take acetaminophen  (Tylenol ) or ibuprofen  (Advil ) as needed. Take your usually prescribed medications unless otherwise directed. If you need a refill on your pain medication, please contact your pharmacy. They will contact our office to request authorization.  Prescriptions will not be filled after 5pm or on week-ends. You should follow a light diet the first few days after arrival home, such as soup and crackers, pudding, etc.unless your doctor has advised otherwise. A high-fiber, low fat diet can be resumed as tolerated.   Be sure to include lots of fluids daily. Most patients will experience some swelling and bruising on the chest and neck area.  Ice packs will help.  Swelling and bruising can take several days to resolve Most patients will experience some swelling and bruising in the area of the incision. Ice pack will help. Swelling and bruising can take several days to resolve..  It is common to experience some constipation if taking pain medication after surgery.  Increasing fluid intake and taking a stool softener will usually help or prevent this problem from occurring.  A mild laxative (Milk of Magnesia or Miralax ) should be taken according to package directions if there are no bowel movements after 48 hours.  You may have steri-strips (small skin tapes) in place directly over the incision.  These strips should be left on the skin for 7-10 days.  If your surgeon used skin  glue on the incision, you may shower in 24 hours.  The glue will flake off over the next 2-3 weeks.  Any sutures or staples will be removed at the office during your follow-up visit. You may find that a light gauze bandage over your incision may keep your staples from being rubbed or pulled. You may shower and replace the bandage daily. ACTIVITIES:  You may resume regular (light) daily activities beginning the next day--such as daily self-care, walking, climbing stairs--gradually increasing activities as tolerated.  You may have sexual intercourse when it is comfortable.  Refrain from any heavy lifting or straining until approved by your doctor. You may drive when you no longer are taking prescription pain medication, you can comfortably wear a seatbelt, and you can safely maneuver your car and apply brakes Return to Work: ___________________________________ Rosine should see your doctor in the office for a follow-up appointment approximately two weeks after your surgery.  Make sure that you call for this appointment within a day or two after you arrive home to insure a convenient appointment time. OTHER INSTRUCTIONS:  _____________________________________________________________ _____________________________________________________________  WHEN TO CALL YOUR DOCTOR: Fever over 101.0 Inability to urinate Nausea and/or vomiting Extreme swelling or bruising Continued bleeding from incision. Increased pain, redness, or drainage from the incision. Difficulty swallowing or breathing Muscle cramping or spasms. Numbness or tingling in hands or feet or around lips.  The clinic staff is available to answer your questions during regular business hours.  Please don't hesitate to call and ask to speak to one of  the nurses if you have concerns.  For further questions, please visit [email or web address]    [employer] assistance programs Crisis assistance programs  -Partners Ending  Homelessness Arts development officer. If you are experiencing homelessness in [place], [place] , your first point of contact should be Pensions consultant. You can reach Coordinated Entry by calling (336) [phone] or by emailing coordinatedentry@partnersendinghomelessness .org.  Community access points: [employer] 336 661 [address], [place]) every Tuesday from 9am-10am. [facility] ([address] [place]) every Wednesday from 8am-9am.   -[place] Coordinated Re-entry Daniel Mcalpine: Dial 211 and request. Offers referrals to homeless shelters in the area.    -The [employer] 843-635-9184) offers several services to local families, as funding allows. The Emergency Assistance Program (EAP), which they administer, provides household goods, free food, clothing, and financial aid to people in need in the [place]  area. The EAP program does have some qualification, and counselors will interview clients for financial assistance by written referral only. Referrals need to be made by the Department of Social Services or by other EAP approved human services agencies or charities in the area.  -Open Door Ministries of [employer], which can be reached at [phone], offers emergency assistance programs for those in need of help, such as food, rent assistance, a soup kitchen, shelter, and clothing. They are based in [facility]  but provide a number of services to those that qualify for assistance.   [facility] Department of Social Services may be able to offer temporary financial assistance and cash grants for paying rent and utilities, Help may be provided for local county residents who may be experiencing personal crisis when other resources, including government programs, are not available. Call ([phone]  -High [employer] is a [employer] agency, The organization can offer emergency assistance for paying rent,  [employer] Rx, utilities, food, household products and furniture. They offer extensive emergency and transitional housing for families, children and single women, and also run a Boy's and [employer]. Thrift Shops, Secondary school teacher, and other aid offered too. [address], [place], [place]  [address], ([phone]  -[place] Low Income Energy Assistance Program -- This is offered for [facility] families. The federal government created [employer] Program provides a one-time cash grant payment to help eligible low-income families pay their electric and heating bills. [address] [place], [place]  [address], [phone] [phone]  -[place] Emergency Assistance -- A program offers emergency utility and rent funds for greater [employer] area residents. The program can also provide counseling and referrals to charities and government programs. Also provides food and a free meal program that serves lunch Mondays - Saturdays and dinner seven days per week to individuals in the community. [address] [place],   [address], [phone]  -[employer] - Offers affordable apartment and housing communities across      [place] and [place]. The low income and seniors can access public housing, rental assistance to qualified applicants, and apply for the section 8 rent subsidy program. Other programs include Chiropractor and Engineer, maintenance. [address], [place], [place]  [address], dial [phone].  -The [facility] provides transitional housing to veterans and the disabled. Clients will also access other services too, including assistance in applying for Disability, life skills classes, case management, and assistance in finding permanent housing. [address] [place],   [address], call (336)  ([phone]  -[place] Transitional Housing through  [employer] is for people who were just evicted or that are formerly homeless. The non-profit will also help then gain self-sufficiency, find a home or apartment to live in, and also provides information on rent assistance when needed. Phone [phone]  -The [place] [facility] helps low income, elderly, or disabled residents in seven counties in the [place] ([place], [place], [place], [place], [place], [place], [place], and [place]) save energy and reduce their utility bills by improving energy efficiency. Phone [phone].  -[employer] is located in the [place] [facility] in the [employer], [address], [place], [place] [address]. Parking is in the rear of the building. Phone: [phone]   General Email: info@gsohc .org  GHC provides free housing counseling assistance in locating affordable rental housing or housing with support services for families and individuals in crisis and the chronically homeless. We provide potential resources for other housing needs like utilities. Our trained counselors also work with clients on budgeting and financial literacy in effort to empower them to take control of their financial situations. [employer] collaborates with homeless service providers and other stakeholders as part of the [employer] COC (Continuum of Care). The (COC) is a regional/local planning body that coordinates housing and services funding for homeless families and individuals. The role of [facility] in the COC is through housing counseling to work with people we serve on diversion strategies for those that are at imminent risk of becoming homeless. We also work with the Coordinated Assessment/Entry Specialist who attempts to find temporary solutions and/or connects the people to Housing First, Rapid Re-housing or transitional housing programs. Our Homelessness  Prevention Housing Counselors meet with clients on business days (Monday-Fridays, except scheduled holidays) from 8:30 am to 4:30 pm.  Legal assistance for evictions, foreclosure, and more -If you need free legal advice on civil issues, such as foreclosures, evictions, Electronics engineer, government programs, domestic issues and more, Armed forces operational officer Aid of [place]  [facility]) is a Associate Professor firm that provides free legal services and counsel to lower income people, seniors, disabled, and others, The goal is to ensure everyone has access to justice and fair representation. Call them at [phone].  [facility] for Housing and Community Studies can provide info about obtaining legal assistance with evictions. Phone [phone].  Data processing manager  The [employer], [employer]. offers job and Dispensing optician. Resources are focused on helping students obtain the skills and experiences that are necessary to compete in today's challenging and tight job market. The non-profit faith-based community action agency offers internship trainings as well as classroom instruction. Classes are tailored to meet the needs of people in the [facility] region. [place], [place] [address], [phone]  Foreclosure prevention/Debt Services Family Services of the [employer] Credit Counseling Service inludes debt and foreclosure prevention programs for local families. This includes money management, financial advice, budget review and development of a written action plan with a Pensions consultant to help solve specific individual financial problems. In addition, housing and mortgage counselors can also provide pre- and post-purchase homeownership counseling, default resolution counseling (to prevent foreclosure) and reverse mortgage counseling. A Debt Management Program allows people and families with a high level of credit card or medical debt to consolidate  and repay consumer debt and loans to creditors and rebuild positive credit ratings and scores. Contact (336) F1555895.  Community clinics in [place] -Health Department  [facility]: [address] [place], [place], [address]. [phone].  -Health Department [place] [facility]: [address], [facility], [address]. [phone].  -[facility] offers medical care through a group of doctors, pharmacies and other healthcare related agencies that offer services for low income, uninsured adults in [place]. Also offers adult Dental care and assistance with applying for an [employer]. Call [phone].   [facility]. This center provides low-cost health care to those without health insurance. Services offered include an onsite pharmacy. Phone [phone]. [address], [address], [place].  -Medication Assistance Program serves as a link between pharmaceutical companies and patients to provide low cost or free prescription medications. This service is available for residents who meet certain income restrictions and have no insurance coverage. PLEASE CALL [phone] [place]) OR ([phone] ([place])  -One Step Further: Materials engineer, The [employer] Support & Nutrition Program, [employer]. Call [phone]/ [phone].  Food pantry and assistance -[facility]-Food Bank: [address] [place] [address]. Phone [phone]  -Bless[facility] Table Food Pantry: [address] [place], [place] [address]. [phone].  -Missionary Ministry: has the purpose of visiting the sick and shut-ins and provide for needs in the surrounding communities. Call (586)771-5457. Email: stpaulbcinc@gmail .com This program provides: Food box for seniors, Financial assistance, Food to meet basic nutritional needs.  -Meals on Wheels with Senior Resources: [facility] residents age 59  and over who are homebound and unable to obtain and prepare a nutritious meal for themselves are eligible for this service. There may be a waiting list in certain parts of [facility] if the route in that area is full. If you are in [facility] and [place] call [phone] to register. For all other areas call [phone] to register.  -Greater Dietitian: https://findfood.BargainContractor.si  TRANSPORTATION: -[employer] Department of Health: Call [facility] and [employer] at ([phone] for details. AttractionGuides.es  -Access [place]: Access [place] is the [employer] Agency's shared-ride transportation service for eligible riders who have a disability that prevents them from riding the fixed route bus. Call [phone]. Access [place] riders must pay a fare of $1.50 per trip, or may purchase a 10-ride punch card for $14.00 ($1.40 per ride) or a 40-ride punch card for $48.00 ($1.20 per ride).  -The Shepherd's WHEELS rideshare transportation service is provided for senior citizens (60+) who live independently within [place] city limits and are unable to drive or have limited access to transportation. Call [phone] to schedule an appointment.  -[place] Transportation: For Medicare or Medicaid recipients call 563-010-8085?[facility]RA Ambulance, wheelchair fleeta, and ambulatory quotes available.   FLEEING VIOLENCE: -Family Services of the [place]- 24/7 Crisis line [phone]) -[facility] Justice Centers: (336) 641-SAFE 249-396-1023)  [place] 2-1-1 is another useful way to locate resources in the community. Visit ShedSizes.ch to find service information online. If you need additional assistance, 2-1-1 Referral Specialists are available 24 hours a day, every day by dialing 2-1-1 or [phone] from any phone. The call is free, confidential, and available in any  language.  Affordable Housing Search http://www.nchousingsearch.[facility])   M-F 8a-3p [address]  [place], [place] [address] ([phone] Services include: laundry, barbering, support groups, case management, phone & computer access, showers, AA/NA mtgs, mental health/substance abuse nurse, job skills class, disability information, VA assistance, spiritual classes, etc. Winter Shelter available when temperatures are less than 32  degrees.   HOMELESS SHELTERS [facility] at [facility]- Call [phone] ext. 347 or ext. 336. Located at [address] [place], [place] [address]  [facility] Mens Shelter- Call [phone]. Located at [address] [place] [address].  [facility]- [employer]. Call ([phone]. Office located at [address] [place] [address].  [facility] Family Housing through [place] [phone].  [facility] Family Shelter- Call [phone]. Located at [address] [place], [place], [place] [address].  Room at the Inn-For Pregnant mothers. Call [phone]. Located at [address] [place], [address].  [place] [facility]-For men in [place]. Call [phone]. [facility]-Shelter in [place]. Call [phone].  Home of [employer] for [employer] [phone]. Office located at [address] [place], [address].  [employer] be agreeable to help with chores. Call [phone] ext. 5000.  Men's: [address] [place], [place] [address]. Women's: [facility]  [address] [place], [place] [address]  Crisis Services [facility]atives Mobile Crisis Management- [phone]  [facility] [address], [place], [place] [address]. Phone: [phone]

## 2023-10-24 DIAGNOSIS — K921 Melena: Secondary | ICD-10-CM

## 2023-10-24 DIAGNOSIS — Z8711 Personal history of peptic ulcer disease: Secondary | ICD-10-CM

## 2023-10-24 DIAGNOSIS — R112 Nausea with vomiting, unspecified: Secondary | ICD-10-CM | POA: Diagnosis not present

## 2023-10-24 DIAGNOSIS — R1032 Left lower quadrant pain: Secondary | ICD-10-CM

## 2023-10-24 DIAGNOSIS — K5792 Diverticulitis of intestine, part unspecified, without perforation or abscess without bleeding: Secondary | ICD-10-CM | POA: Diagnosis not present

## 2023-10-24 LAB — CBC WITH DIFFERENTIAL/PLATELET
Abs Immature Granulocytes: 0.07 K/uL (ref 0.00–0.07)
Basophils Absolute: 0 K/uL (ref 0.0–0.1)
Basophils Relative: 0 %
Eosinophils Absolute: 0.3 K/uL (ref 0.0–0.5)
Eosinophils Relative: 3 %
HCT: 33.5 % — ABNORMAL LOW (ref 36.0–46.0)
Hemoglobin: 11 g/dL — ABNORMAL LOW (ref 12.0–15.0)
Immature Granulocytes: 1 %
Lymphocytes Relative: 25 %
Lymphs Abs: 3.2 K/uL (ref 0.7–4.0)
MCH: 26.1 pg (ref 26.0–34.0)
MCHC: 32.8 g/dL (ref 30.0–36.0)
MCV: 79.6 fL — ABNORMAL LOW (ref 80.0–100.0)
Monocytes Absolute: 1 K/uL (ref 0.1–1.0)
Monocytes Relative: 8 %
Neutro Abs: 8.4 K/uL — ABNORMAL HIGH (ref 1.7–7.7)
Neutrophils Relative %: 63 %
Platelets: 575 K/uL — ABNORMAL HIGH (ref 150–400)
RBC: 4.21 MIL/uL (ref 3.87–5.11)
RDW: 16.6 % — ABNORMAL HIGH (ref 11.5–15.5)
WBC: 13 K/uL — ABNORMAL HIGH (ref 4.0–10.5)
nRBC: 0 % (ref 0.0–0.2)

## 2023-10-24 LAB — COMPREHENSIVE METABOLIC PANEL WITH GFR
ALT: 51 U/L — ABNORMAL HIGH (ref 0–44)
AST: 52 U/L — ABNORMAL HIGH (ref 15–41)
Albumin: 3.2 g/dL — ABNORMAL LOW (ref 3.5–5.0)
Alkaline Phosphatase: 67 U/L (ref 38–126)
Anion gap: 13 (ref 5–15)
BUN: 8 mg/dL (ref 6–20)
CO2: 22 mmol/L (ref 22–32)
Calcium: 9.2 mg/dL (ref 8.9–10.3)
Chloride: 102 mmol/L (ref 98–111)
Creatinine, Ser: 1.17 mg/dL — ABNORMAL HIGH (ref 0.44–1.00)
GFR, Estimated: 54 mL/min — ABNORMAL LOW (ref >60)
Glucose, Bld: 74 mg/dL (ref 70–99)
Potassium: 3.7 mmol/L (ref 3.5–5.1)
Sodium: 137 mmol/L (ref 135–145)
Total Bilirubin: 0.8 mg/dL (ref 0.0–1.2)
Total Protein: 6.7 g/dL (ref 6.5–8.1)

## 2023-10-24 LAB — MAGNESIUM: Magnesium: 1.8 mg/dL (ref 1.7–2.4)

## 2023-10-24 LAB — C-REACTIVE PROTEIN: CRP: 1.5 mg/dL — ABNORMAL HIGH (ref <1.0)

## 2023-10-24 MED ORDER — TEMAZEPAM 15 MG PO CAPS
15.0000 mg | ORAL_CAPSULE | Freq: Every evening | ORAL | Status: DC | PRN
  Administered 2023-10-24 – 2023-10-29 (×6): 15 mg via ORAL
  Filled 2023-10-24 (×6): qty 1

## 2023-10-24 NOTE — Progress Notes (Signed)
 Subjective: CC: Patient stated that her pain remains about the same today as it did yesterday. Denies vomiting but endorses nausea. She stated that she had one loose BM this morning. Patient states she wants to have surgery because this has been a recurrent issue for months.   Objective: Vital signs in last 24 hours: Temp:  [97.6 F (36.4 C)-98.4 F (36.9 C)] 97.9 F (36.6 C) (08/09 0810) Pulse Rate:  [76-106] 106 (08/09 0810) Resp:  [17-18] 17 (08/09 0810) BP: (131-158)/(79-82) 146/82 (08/09 0810) SpO2:  [93 %-95 %] 94 % (08/09 0810)    Intake/Output from previous day: 08/08 0701 - 08/09 0700 In: 1152.4 [P.O.:60; I.V.:942.4; IV Piggyback:150] Out: -  Intake/Output this shift: No intake/output data recorded.  Physical Exam Constitutional:      General: She is not in acute distress.    Appearance: She is ill-appearing.  Cardiovascular:     Rate and Rhythm: Tachycardia present.  Pulmonary:     Effort: Pulmonary effort is normal.  Abdominal:     General: Abdomen is protuberant. Bowel sounds are normal.     Palpations: Abdomen is soft.     Tenderness: There is abdominal tenderness in the periumbilical area, suprapubic area and left lower quadrant. There is no guarding or rebound.  Skin:    General: Skin is warm and dry.  Neurological:     General: No focal deficit present.     Mental Status: She is alert and oriented to person, place, and time.   Lab Results:  Recent Labs    10/23/23 0610 10/24/23 0443  WBC 11.9* 13.0*  HGB 10.7* 11.0*  HCT 33.0* 33.5*  PLT 502* 575*   BMET Recent Labs    10/23/23 0610 10/24/23 0443  NA 142 137  K 3.9 3.7  CL 104 102  CO2 23 22  GLUCOSE 83 74  BUN 9 8  CREATININE 1.08* 1.17*  CALCIUM  9.1 9.2   PT/INR No results for input(s): LABPROT, INR in the last 72 hours. CMP     Component Value Date/Time   NA 137 10/24/2023 0443   NA 144 10/08/2022 1132   K 3.7 10/24/2023 0443   CL 102 10/24/2023 0443   CO2 22  10/24/2023 0443   GLUCOSE 74 10/24/2023 0443   BUN 8 10/24/2023 0443   BUN 17 10/08/2022 1132   CREATININE 1.17 (H) 10/24/2023 0443   CREATININE 1.24 (H) 10/10/2022 1050   CALCIUM  9.2 10/24/2023 0443   PROT 6.7 10/24/2023 0443   PROT 6.9 10/08/2022 1132   ALBUMIN  3.2 (L) 10/24/2023 0443   ALBUMIN  4.2 10/08/2022 1132   AST 52 (H) 10/24/2023 0443   AST 12 (L) 10/10/2022 1050   ALT 51 (H) 10/24/2023 0443   ALT 15 10/10/2022 1050   ALKPHOS 67 10/24/2023 0443   BILITOT 0.8 10/24/2023 0443   BILITOT 0.2 (L) 10/10/2022 1050   GFRNONAA 54 (L) 10/24/2023 0443   GFRNONAA 51 (L) 10/10/2022 1050   GFRAA 51 (L) 03/08/2019 0738   Lipase     Component Value Date/Time   LIPASE 29 10/22/2023 1041    Studies/Results: No results found.  Anti-infectives: Anti-infectives (From admission, onward)    Start     Dose/Rate Route Frequency Ordered Stop   10/23/23 0000  piperacillin -tazobactam (ZOSYN ) IVPB 3.375 g       Placed in Followed by Linked Group   3.375 g 12.5 mL/hr over 240 Minutes Intravenous Every 8 hours 10/22/23 1727  10/22/23 1815  piperacillin -tazobactam (ZOSYN ) IVPB 3.375 g       Placed in Followed by Linked Group   3.375 g 100 mL/hr over 30 Minutes Intravenous  Once 10/22/23 1727 10/22/23 1853   10/22/23 1030  ciprofloxacin  (CIPRO ) IVPB 400 mg        400 mg 200 mL/hr over 60 Minutes Intravenous  Once 10/22/23 1028 10/22/23 1241   10/22/23 1030  metroNIDAZOLE  (FLAGYL ) IVPB 500 mg        500 mg 100 mL/hr over 60 Minutes Intravenous  Once 10/22/23 1028 10/22/23 1315        Assessment/Plan Acute Uncomplicated Diverticulitis    -Continue IV abx -Monitor CBC, WBC count up to 13 K from 11.9 K yesterday.  -Maintain NPO status  -Pain management -Consider OR if symptoms do not improve vs continuing with conservative management. Will discuss further with my attending.   FEN - NPO except for ice chips and sips with meds.  VTE - SubQ heparin   ID - Zosyn     I  reviewed nursing notes, hospitalist notes, last 24 h vitals and pain scores, last 48 h intake and output, last 24 h labs and trends, and last 24 h imaging results.    LOS: 2 days    Eulah Hammonds, [facility] 10/24/2023, 9:28 AM Please see Amion for pager number during day hours 7:00am-4:30pm

## 2023-10-24 NOTE — Plan of Care (Signed)

## 2023-10-24 NOTE — Consult Note (Signed)
 Consultation Note   Referring Provider:  General Surgery PCP: Natalie Bleacher, MD Primary Gastroenterologist:  Natalie Bring, MD  Reason for Consultation:   Abdominal pain, N/V DOA: 10/22/2023     Hospital Day: 3   ASSESSMENT    Patient Profile:  58 y.o. year old female with a medical history including but not limited to : PUD, GERD, chronic abdominal pain, diverticulitis, CKD,  iron  deficiency anemia, polysubstance abuse including alcohol and cocaine), depression with suicidal ideation,  cholecystectomy, hysterectomy.  Currently admitted with possible recurrent sigmoid diverticulitis  # Chronic nausea/vomiting/left-sided abdominal pain # Possible recurrent uncomplicated sigmoid diverticulitis  Multiple ED visits/office visits and CT scan for symptoms over the last year ( ~ 12 scans). Intermittently CT scans have shown uncomplicated sigmoid diverticulitis,  or at least raised concern for it but most of the scans for same symptoms have been negative.   Scan on 7/23 did show moderately severe sigmoid diverticulitis . Symptoms didn't respond to antibiotics but no definite diverticulitis seen on repeat scan this admission .    The chronicity of her abdominal pain, nausea and vomiting makes this difficult to sort out though. However her abdominal pain is mainly in left mid and LLQ so do not suspect recurrent PUD.    History of gastric ulcer 2024 Presented with LUQ pain , N/V in 2024.  EGD 2025 documented healing of the ulcer  Black stool x2 this week Describes black BM this am. No oral iron   / bismuth use.  Hgb stable. On DRE today there is a very tiny fleck of light brown stools  Chronic leukocytosis  / chronic thrombocytosis Stable  See PMH for additional medical history   Principal Problem:   Diverticulitis Active Problems:   Iron  deficiency anemia due to chronic blood loss   GAD (generalized anxiety disorder)   Essential  hypertension   Anxiety and depression   Prediabetes   AKI (acute kidney injury) (HCC)   Cocaine abuse (HCC)   Alcohol abuse   Abdominal pain     PLAN:   Suspect her pain is related to diverticular disease. However, if repeat EGD would be helpful in guiding management then we are happy to proceed and patient is agreeable.     HPI   Natalie Edwards has been seen in our office and in the ED several times over the years for nausea, vomiting and abdominal pain.  She does have a history of gastric ulcers as well as diverticulitis  documented on CT scan.  Over the last.  Year she has had 12 CT scans which have intermittently shown sigmoid diverticulitis or at least could not rule it out.   Over the last few months she has had several rounds of antibiotics for recurrent diverticulitis.  She saw surgery in their office yesterday.  Sent to the ED for severe pain.  She was admitted and surgery has since seen her.  Recommend conservative management for now but given multiple recurrences of diverticulitis it is felt reasonable to proceed with bowel resection at some point  Relevant data : Her white count is 13, down from 17.8 four days ago.  However, she has chronic leukocytosis.  She also has chronic anemia and chronic thrombocytosis.  Hemoglobin stable at  11, MCV 79, platelets 575 (chronically elevated).  AST 52, ALT 51.   CT scan shows diverticulosis with possible developing mild uncomplicated sigmoid diverticulitis .    Natalie Edwards reports having black stool x 2 over the last couple of days ( last one this am).      Labs and Imaging:  Recent Labs    10/22/23 1041 10/23/23 0610 10/24/23 0443  PROT 7.6 6.8 6.7  ALBUMIN  3.6 3.2* 3.2*  AST 31 50* 52*  ALT 33 44 51*  ALKPHOS 73 67 67  BILITOT 0.2 0.5 0.8   Recent Labs    10/22/23 1041 10/23/23 0610 10/24/23 0443  WBC 13.5* 11.9* 13.0*  HGB 11.5* 10.7* 11.0*  HCT 35.8* 33.0* 33.5*  MCV 81.2 81.5 79.6*  PLT 551* 502* 575*   Recent Labs     10/22/23 1041 10/23/23 0610 10/24/23 0443  NA 137 142 137  K 3.9 3.9 3.7  CL 102 104 102  CO2 25 23 22   GLUCOSE 111* 83 74  BUN 11 9 8   CREATININE 1.18* 1.08* 1.17*  CALCIUM  9.4 9.1 9.2     CT ABDOMEN PELVIS W CONTRAST CLINICAL DATA:  Diverticulitis, complication suspected prior hx of diverticulitis with worsening pain - evaluation for perforation  EXAM: CT ABDOMEN AND PELVIS WITH CONTRAST  TECHNIQUE: Multidetector CT imaging of the abdomen and pelvis was performed using the standard protocol following bolus administration of intravenous contrast.  RADIATION DOSE REDUCTION: This exam was performed according to the departmental dose-optimization program which includes automated exposure control, adjustment of the mA and/or kV according to patient size and/or use of iterative reconstruction technique.  CONTRAST:  OMNIPAQUE  IOHEXOL  300 MG/ML  SOLN  COMPARISON:  CT abdomen pelvis 10/07/2023  FINDINGS: Lower chest: Bibasilar atelectasis.  Hepatobiliary: The hepatic parenchyma is diffusely hypodense compared to the splenic parenchyma consistent with fatty infiltration. No focal liver abnormality. Status post cholecystectomy. No intrahepatic biliary dilatation. Mild common bile duct wall dilatation which can be seen in the post cholecystectomy setting.  Pancreas: No focal lesion. Normal pancreatic contour. No surrounding inflammatory changes. No main pancreatic ductal dilatation.  Spleen: Normal in size without focal abnormality.  Adrenals/Urinary Tract:  No adrenal nodule bilaterally.  Bilateral kidneys enhance symmetrically. Fluid density lesions of the bilateral kidneys likely represent simple renal cysts. Simple renal cysts, in the absence of clinically indicated signs/symptoms, require no independent follow-up.  No hydronephrosis. No hydroureter.  No nephroureterolithiasis.  The urinary bladder is unremarkable.  On delayed imaging, there is no  urothelial wall thickening and there are no filling defects in the opacified portions of the bilateral collecting systems or ureters.  Stomach/Bowel: Stomach is within normal limits. No evidence of small bowel wall thickening or dilatation. Colonic diverticulosis. Question mild bowel wall thickening and pericolonic fat stranding along the focal proximal sigmoid colon diverticula (2:54). Appendix appears normal.  Vascular/Lymphatic: No abdominal aorta or iliac aneurysm. Severe atherosclerotic plaque of the aorta and its branches. No abdominal, pelvic, or inguinal lymphadenopathy.  Reproductive: Status post hysterectomy. No adnexal masses.  Other: No intraperitoneal free fluid. No intraperitoneal free gas. No organized fluid collection.  Musculoskeletal:  Tiny fat containing supraumbilical ventral wall hernia (9:105).  No suspicious lytic or blastic osseous lesions. No acute displaced fracture.  IMPRESSION: 1. Colonic diverticulosis with question developing mild proximal sigmoid colon uncomplicated acute diverticulitis. Recommend colonoscopy status post treatment and status post complete resolution of inflammatory changes to exclude an underlying lesion. 2.  Status post cholecystectomy. 3. Tiny fat containing  supraumbilical ventral wall hernia.  These images were dictated once made available on PACS.  Electronically Signed   By: Morgane  Naveau M.D.   On: 10/21/2023 02:21   Pertinent GI Studies   January 2025 EGD plus colonoscopy for follow-up on gastric ulcer and for constipation EGD - Enlarged gastric folds. Biopsied. Likely same thing described as a papule in 2024 - which was inflamed mucosa from prolapse - Scar in the gastric antrum. Biopsied. - The examination was otherwise normal. Colonoscopy - Diverticulosis in the sigmoid colon. - The examination was otherwise normal on direct and retroflexion views. - No specimens collected.  1. Surgical [P], enlarged fold,  antrum :       - GASTRIC ANTRAL MUCOSA WITH NONSPECIFIC REACTIVE GASTROPATHY       - NEGATIVE FOR INTESTINAL METAPLASIA, DYSPLASIA OR MALIGNANCY       - HELICOBACTER PYLORI-LIKE ORGANISMS ARE NOT IDENTIFIED ON ROUTINE H&E STAIN        2. Surgical [P], ulcer scar :       - GASTRIC ANTRAL MUCOSA WITH NONSPECIFIC REACTIVE GASTROPATHY       - NEGATIVE FOR INTESTINAL METAPLASIA, DYSPLASIA OR MALIGNANCY       - HELICOBACTER PYLORI-LIKE ORGANISMS ARE NOT IDENTIFIED ON ROUTINE H&E STAIN    09/29/2022 EGD for suspected upper GI bleed and IDA - Z-line regular, 38 cm from the incisors. - No gross lesions in the entire esophagus. - Non-bleeding gastric ulcer with no stigmata of bleeding. - A single mucosal papule (nodule) found in the stomach. Biopsied. - Gastritis. Biopsied. - Normal examined duodenum  A. STOMACH, ANTRUM NODULE, BIOPSY:  - Polypoid gastric antral mucosa showing marked reactive gastropathy and  features of mucosal prolapse  - Negative for intestinal metaplasia, dysplasia or malignancy   B. STOMACH, RANDOM, BIOPSY:  - Gastric antral and oxyntic mucosa with nonspecific reactive  gastropathy  - Helicobacter pylori-like organisms are not identified on routine HE  stain     Past Medical History:  Diagnosis Date   Allergy    Anemia    Diverticulosis    Gall bladder stones 1993   Gastric ulcer    GERD (gastroesophageal reflux disease)    Hypertension    Renal mass     Past Surgical History:  Procedure Laterality Date   ABDOMINAL HYSTERECTOMY     BIOPSY  09/29/2022   Procedure: BIOPSY;  Surgeon: Shila Gustav GAILS, MD;  Location: [facility];  Service: Gastroenterology;;   CHOLECYSTECTOMY     COLONOSCOPY WITH ESOPHAGOGASTRODUODENOSCOPY (EGD)  04/07/2023   [facility]   ESOPHAGOGASTRODUODENOSCOPY (EGD) WITH PROPOFOL  N/A 09/29/2022   Procedure: ESOPHAGOGASTRODUODENOSCOPY (EGD) WITH PROPOFOL ;  Surgeon: Shila Gustav GAILS, MD;  Location: [facility];  Service:  Gastroenterology;  Laterality: N/A;    Family History  Problem Relation Age of Onset   Cancer Maternal Uncle        Lung   Cancer Maternal Uncle        Lung   Cancer Maternal Uncle        Lung   Headache Neg Hx        I don't think so   Migraines Neg Hx        I don't think so    Prior to Admission medications   Medication Sig Start Date End Date Taking? Authorizing Provider  ciprofloxacin  (CIPRO ) 500 MG tablet Take 1 tablet (500 mg total) by mouth every 12 (twelve) hours. Patient taking differently: Take 500 mg by mouth every 12 (  twelve) hours. Until 10/26/2023 10/21/23  Yes Kommor, Madison, MD  HYDROcodone -acetaminophen  (NORCO/VICODIN) 5-325 MG tablet Take 2 tablets by mouth every 4 (four) hours as needed. 10/21/23  Yes Kommor, Madison, MD  metroNIDAZOLE  (FLAGYL ) 500 MG tablet Take 1 tablet (500 mg total) by mouth 2 (two) times daily for 5 days. 10/21/23 10/26/23 Yes Kommor, Madison, MD  busPIRone  (BUSPAR ) 7.5 MG tablet Take 7.5 mg by mouth 2 (two) times daily. Patient not taking: Reported on 10/23/2023 09/15/23   [provider]  dicyclomine  (BENTYL ) 20 MG tablet Take 1 tablet (20 mg total) by mouth 2 (two) times daily. Patient not taking: Reported on 10/23/2023 10/08/23   Elnor Savant A, DO  FLUoxetine  (PROZAC ) 20 MG capsule Take 20 mg by mouth daily. Patient not taking: Reported on 10/23/2023 09/15/23   [provider]  gabapentin  (NEURONTIN ) 300 MG capsule Take 1 capsule (300 mg total) by mouth 2 (two) times daily. Patient not taking: Reported on 10/23/2023 08/06/23   Natalie Bleacher, MD  mirtazapine  (REMERON ) 45 MG tablet Take 45 mg by mouth at bedtime. Patient not taking: Reported on 10/23/2023 09/25/23   [provider]  naphazoline-glycerin  (CLEAR EYES REDNESS) 0.012-0.25 % SOLN Place 1-2 drops into both eyes 4 (four) times daily as needed for eye irritation. Patient not taking: Reported on 10/23/2023 06/11/23   Jadapalle, Sree, MD  nicotine  (NICODERM CQ  - DOSED IN  MG/24 HOURS) 21 mg/24hr patch Place 1 patch (21 mg total) onto the skin daily. Patient not taking: Reported on 10/23/2023 06/12/23   Jadapalle, Sree, MD  pantoprazole  (PROTONIX ) 40 MG tablet Take 1 tablet (40 mg total) by mouth 2 (two) times daily. Patient not taking: Reported on 10/23/2023 09/02/23   May, Deanna J, NP  pregabalin  (LYRICA ) 75 MG capsule Take 1 capsule (75 mg total) by mouth 2 (two) times daily. 10/23/23   Natalie Bleacher, MD  risperiDONE  (RISPERDAL ) 1 MG tablet Take 1 mg by mouth 2 (two) times daily. Patient not taking: Reported on 10/23/2023    [provider]  temazepam  (RESTORIL ) 15 MG capsule Take 15 mg by mouth at bedtime as needed. Patient not taking: Reported on 10/23/2023 09/29/23   [provider]    Current Facility-Administered Medications  Medication Dose Route Frequency Provider Last Rate Last Admin   acetaminophen  (TYLENOL ) tablet 650 mg  650 mg Oral Q6H PRN Patel, Ekta V, MD   650 mg at 10/22/23 1809   Or   acetaminophen  (TYLENOL ) suppository 650 mg  650 mg Rectal Q6H PRN Patel, Ekta V, MD       busPIRone  (BUSPAR ) tablet 10 mg  10 mg Oral BID Prunty, Donald B, DO   10 mg at 10/24/23 9143   FLUoxetine  (PROZAC ) capsule 20 mg  20 mg Oral Daily Patel, Ekta V, MD   20 mg at 10/24/23 0855   folic acid  (FOLVITE ) tablet 1 mg  1 mg Oral Daily Patel, Ekta V, MD   1 mg at 10/24/23 0856   heparin  injection 5,000 Units  5,000 Units Subcutaneous Q12H Patel, Ekta V, MD   5,000 Units at 10/24/23 9144   lactated ringers  infusion   Intravenous Continuous Tobie Mario GAILS, MD 75 mL/hr at 10/23/23 1146 New Bag at 10/23/23 1146   LORazepam  (ATIVAN ) tablet 1-4 mg  1-4 mg Oral Q1H PRN Patel, Ekta V, MD   2 mg at 10/23/23 1846   Or   LORazepam  (ATIVAN ) injection 1-4 mg  1-4 mg Intravenous Q1H PRN Patel, Ekta V, MD  2 mg at 10/23/23 2202   morphine  (PF) 2 MG/ML injection 2 mg  2 mg Intravenous Q4H PRN Patel, Ekta V, MD   2 mg at 10/24/23 0857   multivitamin with minerals tablet 1  tablet  1 tablet Oral Daily Patel, Ekta V, MD   1 tablet at 10/24/23 9143   nicotine  (NICODERM CQ  - dosed in mg/24 hours) patch 21 mg  21 mg Transdermal Daily Patel, Ekta V, MD   21 mg at 10/24/23 9097   pantoprazole  (PROTONIX ) injection 40 mg  40 mg Intravenous Q12H Tobie Mario GAILS, MD   40 mg at 10/24/23 9144   piperacillin -tazobactam (ZOSYN ) IVPB 3.375 g  3.375 g Intravenous Q8H Pham, Minh Q, RPH-CPP 12.5 mL/hr at 10/24/23 0908 3.375 g at 10/24/23 0908   sodium chloride  flush (NS) 0.9 % injection 3 mL  3 mL Intravenous Q12H Tobie Mario GAILS, MD   3 mL at 10/24/23 9093   thiamine  (VITAMIN B1) tablet 100 mg  100 mg Oral Daily Tobie Mario GAILS, MD   100 mg at 10/23/23 9047   Or   thiamine  (VITAMIN B1) injection 100 mg  100 mg Intravenous Daily Tobie Mario GAILS, MD   100 mg at 10/24/23 0857    Allergies as of 10/22/2023 - Review Complete 10/22/2023  Allergen Reaction Noted   Hydroxyzine  Palpitations and Other (See Comments) 06/07/2018   Nsaids Other (See Comments) 08/30/2023   Ambien  [zolpidem  tartrate] Other (See Comments) 06/07/2018   Ibuprofen  Other (See Comments) 02/16/2023    Social History   Socioeconomic History   Marital status: Single    Spouse name: Not on file   Number of children: 3   Years of education: Not on file   Highest education level: 12th grade  Occupational History   Occupation: environmental services at [employer]  Tobacco Use   Smoking status: Every Day    Current packs/day: 0.50    Average packs/day: 0.5 packs/day for 25.0 years (12.5 ttl pk-yrs)    Types: Cigarettes   Smokeless tobacco: Never   Tobacco comments:    Pt tried to quit - helps with anxiety     1 pack last patient 3 days   Vaping Use   Vaping status: Never Used  Substance and Sexual Activity   Alcohol use: Yes    Comment: occasionally - last drink was July 4, 1 shot   Drug use: No   Sexual activity: Yes    Comment: hysterectomy  Other Topics Concern   Not on file  Social History Narrative    Lives at home in an apartment. Her mother lives with her.    Right handed   Caffeine: drinks approx. 36 oz of pepsi per day. Sometimes drinks 2 cups of coffee in a day as well.    Social Drivers of Corporate investment banker Strain: Low Risk  (03/06/2023)   Overall Financial Resource Strain (CARDIA)    Difficulty of Paying Living Expenses: Not very hard  Food Insecurity: No Food Insecurity (10/22/2023)   Hunger Vital Sign    Worried About Running Out of Food in the Last Year: Never true    Ran Out of Food in the Last Year: Never true  Transportation Needs: No Transportation Needs (10/22/2023)   PRAPARE - Administrator, Civil Service (Medical): No    Lack of Transportation (Non-Medical): No  Physical Activity: Sufficiently Active (05/22/2022)   Exercise Vital Sign    Days of Exercise per Week: 4 days  Minutes of Exercise per Session: 70 min  Stress: Stress Concern Present (03/06/2023)   [employer] of Occupational Health - Occupational Stress Questionnaire    Feeling of Stress : Very much  Social Connections: Socially Isolated (05/26/2023)   Social Connection and Isolation Panel    Frequency of Communication with Friends and Family: More than three times a week    Frequency of Social Gatherings with Friends and Family: More than three times a week    Attends Religious Services: Never    Database administrator or Organizations: No    Attends Banker Meetings: Never    Marital Status: Divorced  Catering manager Violence: Unknown (10/22/2023)   Humiliation, Afraid, Rape, and Kick questionnaire    Fear of Current or Ex-Partner: Patient declined    Emotionally Abused: Patient declined    Physically Abused: Patient declined    Sexually Abused: No     Code Status   Code Status: Full Code  Review of Systems: All systems reviewed and negative except where noted in HPI.  Physical Exam: Vital signs in last 24 hours: Temp:  [97.6 F (36.4 C)-98.4 F (36.9  C)] 97.9 F (36.6 C) (08/09 0810) Pulse Rate:  [76-106] 106 (08/09 0810) Resp:  [17-18] 17 (08/09 0810) BP: (131-158)/(79-82) 146/82 (08/09 0810) SpO2:  [93 %-95 %] 94 % (08/09 0810)    General:  Pleasant female in NAD Psych:   Normal mood and affect Eyes: Pupils equal Ears:  Normal auditory acuity Nose: No deformity, discharge or lesions Neck:  Supple, no masses felt Lungs:  Clear to auscultation.  Heart:  Regular rate, regular rhythm.  Abdomen:  Soft, nondistended, nontender, active bowel sounds, no masses felt Rectal :  DRE with small fleck of light brown stool in vault.  Msk: Symmetrical without gross deformities.  Neurologic:  Alert, oriented, grossly normal neurologically Extremities : No edema  Intake/Output from previous day: 08/08 0701 - 08/09 0700 In: 1152.4 [P.O.:60; I.V.:942.4; IV Piggyback:150] Out: -  Intake/Output this shift:  No intake/output data recorded.   Vina Dasen, NP-C   10/24/2023, 1:44 PM

## 2023-10-24 NOTE — Progress Notes (Signed)
 PROGRESS NOTE    Natalie Edwards  FMW:[id number] DOB: 01/05/1966 DOA: 10/22/2023 PCP: Tanda Bleacher, MD   Brief Narrative:  58 year old female with history of diverticular disease with recent recurrent diverticulitis treated with few rounds of antibiotics, hypertension, renal mass, GERD/peptic ulcer disease, polysubstance use including alcohol and cocaine use, major depressive disorder and suicide attempts presented with worsening abdominal pain with recent multiple visits to the ED in the last few months for abdominal pain.  On presentation, WBC was 13.5.  CT of the abdomen and pelvis with contrast on 10/21/2023 had shown diverticulosis with possible acute sigmoid diverticulitis.  She was started on IV fluids and antibiotics.  General surgery was consulted.  Assessment & Plan:   Recurrent acute diverticulitis -Patient has had treatment with few rounds of antibiotics for recurrent diverticulitis over the last few months and was supposed to be referred to general surgery as an outpatient. -Presented with worsening abdominal pain and CT recently was as above. - General Surgery following.  Diet as per general surgery recommendations.  Continue IV fluids and antibiotics.    Polysubstance use including alcohol and cocaine abuse - Urine drug screen positive for benzodiazepines and opiates. - Showing no signs of withdrawal.  Currently on CIWA protocol.  Continue multivitamin, thiamine  and folic acid   Major depressive disorder with history of suicidal ideation - Psychiatry consulted on 10/23/2023: Continue BuSpar  and fluoxetine .  Risperidone  stopped by psychiatry.  Currently on as needed Ativan   Tobacco abuse -Will need counseling once clinically more stable  GERD/peptic ulcer disease - Continue IV Protonix   Leukocytosis - Monitor  Anemia of chronic disease - From chronic illnesses.  Hemoglobin stable.  Monitor intermittently  Thrombocytosis -Mostly reactive.  Monitor intermittently  Mildly  elevated LFTs -Question cause.  Monitor intermittently.  Obesity - Outpatient follow-up   DVT prophylaxis: Heparin  subcutaneous Code Status: Full Family Communication: None at bedside Disposition Plan: Status is: Inpatient Remains inpatient appropriate because: Of severity of illness    Consultants: General Surgery/psychiatry  Procedures: None  Antimicrobials: Zosyn  from 10/22/2023 onwards   Subjective: Patient seen and examined at bedside.  Continues to have intermittent abdominal pain with nausea.  No fever, vomiting or agitation reported. Objective: Vitals:   10/23/23 1844 10/23/23 1953 10/24/23 0357 10/24/23 0810  BP: (!) 158/80 (!) 150/80 131/79 (!) 146/82  Pulse: 88 76 91 (!) 106  Resp:  17 18 17   Temp:  98.3 F (36.8 C) 98.4 F (36.9 C) 97.9 F (36.6 C)  TempSrc:   Oral   SpO2:  94% 95% 94%  Weight:      Height:        Intake/Output Summary (Last 24 hours) at 10/24/2023 0817 Last data filed at 10/24/2023 0024 Gross per 24 hour  Intake 1102.43 ml  Output --  Net 1102.43 ml   Filed Weights   10/22/23 1011  Weight: 81.6 kg    Examination:  General: On room air.  No distress ENT/neck: No thyromegaly.  JVD is not elevated  respiratory: Decreased breath sounds at bases bilaterally with some crackles; no wheezing  CVS: S1-S2 heard, rate controlled currently Abdominal: Soft, mildly tender in the left lower quadrant; slightly distended; no organomegaly,  bowel sounds are heard Extremities: Trace lower extremity edema; no cyanosis  CNS: Awake and alert.  Still slow to respond and a poor historian.  No focal neurologic deficit.  Moves extremities Lymph: No obvious lymphadenopathy Skin: No obvious ecchymosis/lesions  psych: Extremely flat affect.  Not agitated currently. musculoskeletal: No obvious joint  swelling/deformity     Data Reviewed: I have personally reviewed following labs and imaging studies  CBC: Recent Labs  Lab 10/20/23 1940 10/22/23 1041  10/23/23 0610 10/24/23 0443  WBC 17.8* 13.5* 11.9* 13.0*  NEUTROABS 12.8* 10.3*  --  8.4*  HGB 11.3* 11.5* 10.7* 11.0*  HCT 34.7* 35.8* 33.0* 33.5*  MCV 81.6 81.2 81.5 79.6*  PLT 492* 551* 502* 575*   Basic Metabolic Panel: Recent Labs  Lab 10/20/23 1940 10/22/23 1041 10/23/23 0610 10/24/23 0443  NA 140 137 142 137  K 4.4 3.9 3.9 3.7  CL 102 102 104 102  CO2 24 25 23 22   GLUCOSE 97 111* 83 74  BUN 15 11 9 8   CREATININE 1.03* 1.18* 1.08* 1.17*  CALCIUM  9.1 9.4 9.1 9.2  MG  --   --   --  1.8   GFR: Estimated Creatinine Clearance: 53.5 mL/min (A) (by C-G formula based on SCr of 1.17 mg/dL (H)). Liver Function Tests: Recent Labs  Lab 10/20/23 1940 10/22/23 1041 10/23/23 0610 10/24/23 0443  AST 28 31 50* 52*  ALT 32 33 44 51*  ALKPHOS 79 73 67 67  BILITOT 0.4 0.2 0.5 0.8  PROT 7.4 7.6 6.8 6.7  ALBUMIN  3.4* 3.6 3.2* 3.2*   Recent Labs  Lab 10/20/23 1940 10/22/23 1041  LIPASE 35 29   No results for input(s): AMMONIA in the last 168 hours. Coagulation Profile: No results for input(s): INR, PROTIME in the last 168 hours. Cardiac Enzymes: No results for input(s): CKTOTAL, CKMB, CKMBINDEX, TROPONINI in the last 168 hours. BNP (last 3 results) No results for input(s): PROBNP in the last 8760 hours. HbA1C: No results for input(s): HGBA1C in the last 72 hours. CBG: No results for input(s): GLUCAP in the last 168 hours. Lipid Profile: No results for input(s): CHOL, HDL, LDLCALC, TRIG, CHOLHDL, LDLDIRECT in the last 72 hours. Thyroid  Function Tests: No results for input(s): TSH, T4TOTAL, FREET4, T3FREE, THYROIDAB in the last 72 hours. Anemia Panel: No results for input(s): VITAMINB12, FOLATE, FERRITIN, TIBC, IRON , RETICCTPCT in the last 72 hours. Sepsis Labs: Recent Labs  Lab 10/22/23 1249  LATICACIDVEN 0.7    No results found for this or any previous visit (from the past 240 hours).       Radiology  Studies: No results found.      Scheduled Meds:  busPIRone   10 mg Oral BID   FLUoxetine   20 mg Oral Daily   folic acid   1 mg Oral Daily   heparin   5,000 Units Subcutaneous Q12H   multivitamin with minerals  1 tablet Oral Daily   nicotine   21 mg Transdermal Daily   pantoprazole  (PROTONIX ) IV  40 mg Intravenous Q12H   sodium chloride  flush  3 mL Intravenous Q12H   thiamine   100 mg Oral Daily   Or   thiamine   100 mg Intravenous Daily   Continuous Infusions:  lactated ringers  75 mL/hr at 10/23/23 1146   piperacillin -tazobactam (ZOSYN )  IV 3.375 g (10/24/23 0024)          Sophie Mao, MD [facility] 10/24/2023, 8:17 AM

## 2023-10-25 DIAGNOSIS — K5792 Diverticulitis of intestine, part unspecified, without perforation or abscess without bleeding: Secondary | ICD-10-CM | POA: Diagnosis not present

## 2023-10-25 LAB — COMPREHENSIVE METABOLIC PANEL WITH GFR
ALT: 39 U/L (ref 0–44)
AST: 30 U/L (ref 15–41)
Albumin: 3.1 g/dL — ABNORMAL LOW (ref 3.5–5.0)
Alkaline Phosphatase: 63 U/L (ref 38–126)
Anion gap: 15 (ref 5–15)
BUN: 9 mg/dL (ref 6–20)
CO2: 21 mmol/L — ABNORMAL LOW (ref 22–32)
Calcium: 9.1 mg/dL (ref 8.9–10.3)
Chloride: 102 mmol/L (ref 98–111)
Creatinine, Ser: 1.1 mg/dL — ABNORMAL HIGH (ref 0.44–1.00)
GFR, Estimated: 58 mL/min — ABNORMAL LOW (ref >60)
Glucose, Bld: 59 mg/dL — ABNORMAL LOW (ref 70–99)
Potassium: 3.6 mmol/L (ref 3.5–5.1)
Sodium: 138 mmol/L (ref 135–145)
Total Bilirubin: 0.8 mg/dL (ref 0.0–1.2)
Total Protein: 7 g/dL (ref 6.5–8.1)

## 2023-10-25 LAB — CBC WITH DIFFERENTIAL/PLATELET
Abs Immature Granulocytes: 0.05 K/uL (ref 0.00–0.07)
Basophils Absolute: 0 K/uL (ref 0.0–0.1)
Basophils Relative: 0 %
Eosinophils Absolute: 0.4 K/uL (ref 0.0–0.5)
Eosinophils Relative: 3 %
HCT: 34 % — ABNORMAL LOW (ref 36.0–46.0)
Hemoglobin: 11.3 g/dL — ABNORMAL LOW (ref 12.0–15.0)
Immature Granulocytes: 0 %
Lymphocytes Relative: 27 %
Lymphs Abs: 3.8 K/uL (ref 0.7–4.0)
MCH: 26.7 pg (ref 26.0–34.0)
MCHC: 33.2 g/dL (ref 30.0–36.0)
MCV: 80.4 fL (ref 80.0–100.0)
Monocytes Absolute: 1.1 K/uL — ABNORMAL HIGH (ref 0.1–1.0)
Monocytes Relative: 8 %
Neutro Abs: 8.6 K/uL — ABNORMAL HIGH (ref 1.7–7.7)
Neutrophils Relative %: 62 %
Platelets: 578 K/uL — ABNORMAL HIGH (ref 150–400)
RBC: 4.23 MIL/uL (ref 3.87–5.11)
RDW: 16.9 % — ABNORMAL HIGH (ref 11.5–15.5)
WBC: 14 K/uL — ABNORMAL HIGH (ref 4.0–10.5)
nRBC: 0 % (ref 0.0–0.2)

## 2023-10-25 LAB — C-REACTIVE PROTEIN: CRP: 3.3 mg/dL — ABNORMAL HIGH (ref <1.0)

## 2023-10-25 LAB — MAGNESIUM: Magnesium: 1.6 mg/dL — ABNORMAL LOW (ref 1.7–2.4)

## 2023-10-25 MED ORDER — LORAZEPAM 1 MG PO TABS
1.0000 mg | ORAL_TABLET | ORAL | Status: DC | PRN
  Administered 2023-10-25: 1 mg via ORAL
  Filled 2023-10-25: qty 1

## 2023-10-25 MED ORDER — MORPHINE SULFATE (PF) 2 MG/ML IV SOLN
2.0000 mg | INTRAVENOUS | Status: DC | PRN
  Administered 2023-10-25 – 2023-10-26 (×7): 2 mg via INTRAVENOUS
  Filled 2023-10-25 (×5): qty 1

## 2023-10-25 MED ORDER — LORAZEPAM 2 MG/ML IJ SOLN: 1.0000 mg | INTRAMUSCULAR | Status: DC | PRN

## 2023-10-25 NOTE — Plan of Care (Signed)

## 2023-10-25 NOTE — Progress Notes (Signed)
 Zosyn  hung. New IV line placed.

## 2023-10-25 NOTE — Progress Notes (Addendum)
 PROGRESS NOTE    Natalie Edwards  FMW:[id number] DOB: [birth year] DOA: 10/22/2023 PCP: Tanda Bleacher, MD   Brief Narrative:  58 year old female with history of diverticular disease with recent recurrent diverticulitis treated with few rounds of antibiotics, hypertension, renal mass, GERD/peptic ulcer disease, polysubstance use including alcohol and cocaine use, major depressive disorder and suicide attempts presented with worsening abdominal pain with recent multiple visits to the ED in the last few months for abdominal pain.  On presentation, WBC was 13.5.  CT of the abdomen and pelvis with contrast on 10/21/2023 had shown diverticulosis with possible acute sigmoid diverticulitis.  She was started on IV fluids and antibiotics.  General surgery was consulted.  GI was also consulted for persistent abdominal pain.  Assessment & Plan:   Recurrent acute diverticulitis -Patient has had treatment with few rounds of antibiotics for recurrent diverticulitis over the last few months and was supposed to be referred to general surgery as an outpatient. -Presented with worsening abdominal pain and CT recently was as above. - General Surgery following.  Diet as per general surgery recommendations.  Continue IV fluids and antibiotics.   - GI following as well  Polysubstance use including alcohol and cocaine abuse - Urine drug screen positive for benzodiazepines and opiates. - Showing no signs of withdrawal.  Currently on CIWA protocol.  Continue multivitamin, thiamine  and folic acid   Major depressive disorder with history of suicidal ideation - Psychiatry consulted on 10/23/2023: Continue BuSpar  and fluoxetine .  Risperidone  stopped by psychiatry.  Currently on as needed Ativan   Tobacco abuse - Tobacco cessation counseling done  GERD/peptic ulcer disease - Continue IV Protonix   Leukocytosis - Monitor.  (Today.  Anemia of chronic disease - From chronic illnesses.  Hemoglobin stable.  Monitor  intermittently  Thrombocytosis -Mostly reactive.  Monitor intermittently  Mildly elevated LFTs -Question cause.  Monitor intermittently.  Obesity - Outpatient follow-up   DVT prophylaxis: Heparin  subcutaneous Code Status: Full Family Communication: None at bedside Disposition Plan: Status is: Inpatient Remains inpatient appropriate because: Of severity of illness    Consultants: General Surgery/psychiatry/GI  Procedures: None  Antimicrobials: Zosyn  from 10/22/2023 onwards   Subjective: Patient seen and examined at bedside.  Still having intermittent abdominal pain requiring IV morphine .  No fever, chest pain or shortness of breath reported.  Objective: Vitals:   10/24/23 1848 10/24/23 1953 10/25/23 0431 10/25/23 0500  BP: (!) 150/79 (!) 150/84 (!) 155/85   Pulse: 88 94 80   Resp: 18 17 18    Temp: 98.1 F (36.7 C) 98.1 F (36.7 C) 97.9 F (36.6 C)   TempSrc:  Oral Oral   SpO2: 95% 94% 93%   Weight:    88.3 kg  Height:        Intake/Output Summary (Last 24 hours) at 10/25/2023 0810 Last data filed at 10/24/2023 1423 Gross per 24 hour  Intake 0 ml  Output --  Net 0 ml   Filed Weights   10/22/23 1011 10/25/23 0500  Weight: 81.6 kg 88.3 kg    Examination:  General: Chronically ill and deconditioned looking.  No acute distress.  ENT/neck: No neck masses or JVD elevation noted respiratory: Bilateral decreased breath sounds at bases with scattered crackles CVS: Rate mostly controlled; S1 and S2 are heard  abdominal: Soft, left lower quadrant tenderness present; distended mildly; no organomegaly,  bowel sounds are heard normally Extremities: No clubbing; mild lower extremity edema present  CNS: Alert and oriented.  Remains slow to respond and poor historian.  No focal neurologic  deficit.  Able to move extremities Lymph: No obvious palpable lymphadenopathy Skin: No obvious petechiae/lesions psych: Showing no signs of agitation currently.  Flat affect mostly  musculoskeletal: No obvious joint tenderness/erythema     Data Reviewed: I have personally reviewed following labs and imaging studies  CBC: Recent Labs  Lab 10/20/23 1940 10/22/23 1041 10/23/23 0610 10/24/23 0443  WBC 17.8* 13.5* 11.9* 13.0*  NEUTROABS 12.8* 10.3*  --  8.4*  HGB 11.3* 11.5* 10.7* 11.0*  HCT 34.7* 35.8* 33.0* 33.5*  MCV 81.6 81.2 81.5 79.6*  PLT 492* 551* 502* 575*   Basic Metabolic Panel: Recent Labs  Lab 10/20/23 1940 10/22/23 1041 10/23/23 0610 10/24/23 0443  NA 140 137 142 137  K 4.4 3.9 3.9 3.7  CL 102 102 104 102  CO2 24 25 23 22   GLUCOSE 97 111* 83 74  BUN 15 11 9 8   CREATININE 1.03* 1.18* 1.08* 1.17*  CALCIUM  9.1 9.4 9.1 9.2  MG  --   --   --  1.8   GFR: Estimated Creatinine Clearance: 55.7 mL/min (A) (by C-G formula based on SCr of 1.17 mg/dL (H)). Liver Function Tests: Recent Labs  Lab 10/20/23 1940 10/22/23 1041 10/23/23 0610 10/24/23 0443  AST 28 31 50* 52*  ALT 32 33 44 51*  ALKPHOS 79 73 67 67  BILITOT 0.4 0.2 0.5 0.8  PROT 7.4 7.6 6.8 6.7  ALBUMIN  3.4* 3.6 3.2* 3.2*   Recent Labs  Lab 10/20/23 1940 10/22/23 1041  LIPASE 35 29   No results for input(s): AMMONIA in the last 168 hours. Coagulation Profile: No results for input(s): INR, PROTIME in the last 168 hours. Cardiac Enzymes: No results for input(s): CKTOTAL, CKMB, CKMBINDEX, TROPONINI in the last 168 hours. BNP (last 3 results) No results for input(s): PROBNP in the last 8760 hours. HbA1C: No results for input(s): HGBA1C in the last 72 hours. CBG: No results for input(s): GLUCAP in the last 168 hours. Lipid Profile: No results for input(s): CHOL, HDL, LDLCALC, TRIG, CHOLHDL, LDLDIRECT in the last 72 hours. Thyroid  Function Tests: No results for input(s): TSH, T4TOTAL, FREET4, T3FREE, THYROIDAB in the last 72 hours. Anemia Panel: No results for input(s): VITAMINB12, FOLATE, FERRITIN, TIBC, IRON ,  RETICCTPCT in the last 72 hours. Sepsis Labs: Recent Labs  Lab 10/22/23 1249  LATICACIDVEN 0.7    No results found for this or any previous visit (from the past 240 hours).       Radiology Studies: No results found.      Scheduled Meds:  busPIRone   10 mg Oral BID   FLUoxetine   20 mg Oral Daily   folic acid   1 mg Oral Daily   heparin   5,000 Units Subcutaneous Q12H   multivitamin with minerals  1 tablet Oral Daily   nicotine   21 mg Transdermal Daily   pantoprazole  (PROTONIX ) IV  40 mg Intravenous Q12H   sodium chloride  flush  3 mL Intravenous Q12H   thiamine   100 mg Oral Daily   Or   thiamine   100 mg Intravenous Daily   Continuous Infusions:  lactated ringers  75 mL/hr at 10/25/23 0534   piperacillin -tazobactam (ZOSYN )  IV 3.375 g (10/24/23 2313)          Sophie Mao, MD [facility] 10/25/2023, 8:10 AM

## 2023-10-25 NOTE — Plan of Care (Signed)

## 2023-10-26 DIAGNOSIS — K5792 Diverticulitis of intestine, part unspecified, without perforation or abscess without bleeding: Secondary | ICD-10-CM | POA: Diagnosis not present

## 2023-10-26 MED ORDER — METRONIDAZOLE 500 MG PO TABS
1000.0000 mg | ORAL_TABLET | ORAL | Status: DC
  Administered 2023-10-27 (×2): 1000 mg via ORAL
  Filled 2023-10-26 (×2): qty 2

## 2023-10-26 MED ORDER — POLYETHYLENE GLYCOL 3350 17 GM/SCOOP PO POWD
238.0000 g | Freq: Once | ORAL | Status: AC
  Administered 2023-10-26 (×2): 238 g via ORAL
  Filled 2023-10-26: qty 238

## 2023-10-26 MED ORDER — ENSURE PRE-SURGERY PO LIQD
296.0000 mL | Freq: Once | ORAL | Status: AC
  Administered 2023-10-27 (×2): 296 mL via ORAL
  Filled 2023-10-26: qty 296

## 2023-10-26 MED ORDER — ENSURE PRE-SURGERY PO LIQD
592.0000 mL | Freq: Once | ORAL | Status: AC
  Administered 2023-10-26 (×2): 592 mL via ORAL
  Filled 2023-10-26: qty 592

## 2023-10-26 MED ORDER — NEOMYCIN SULFATE 500 MG PO TABS
1000.0000 mg | ORAL_TABLET | ORAL | Status: DC
  Administered 2023-10-27 (×2): 1000 mg via ORAL
  Filled 2023-10-26 (×4): qty 2

## 2023-10-26 MED ORDER — PROCHLORPERAZINE EDISYLATE 10 MG/2ML IJ SOLN
5.0000 mg | Freq: Once | INTRAMUSCULAR | Status: AC
  Administered 2023-10-27 (×2): 5 mg via INTRAVENOUS
  Filled 2023-10-26: qty 2

## 2023-10-26 MED ORDER — HYDROMORPHONE HCL 1 MG/ML IJ SOLN
1.0000 mg | INTRAMUSCULAR | Status: DC | PRN
  Administered 2023-10-26: 2 mg via INTRAVENOUS
  Administered 2023-10-26: 1 mg via INTRAVENOUS
  Administered 2023-10-26: 2 mg via INTRAVENOUS
  Administered 2023-10-26: 1 mg via INTRAVENOUS
  Administered 2023-10-26: 2 mg via INTRAVENOUS
  Administered 2023-10-26 (×4): 1 mg via INTRAVENOUS
  Administered 2023-10-26: 2 mg via INTRAVENOUS
  Administered 2023-10-27 (×7): 1 mg via INTRAVENOUS
  Administered 2023-10-27: 2 mg via INTRAVENOUS
  Administered 2023-10-27: 1 mg via INTRAVENOUS
  Administered 2023-10-27: 2 mg via INTRAVENOUS
  Administered 2023-10-28 (×3): 1 mg via INTRAVENOUS
  Administered 2023-10-28: 2 mg via INTRAVENOUS
  Administered 2023-10-28 (×3): 1 mg via INTRAVENOUS
  Administered 2023-10-28: 2 mg via INTRAVENOUS
  Filled 2023-10-26 (×3): qty 1
  Filled 2023-10-26: qty 2
  Filled 2023-10-26 (×7): qty 1
  Filled 2023-10-26: qty 2
  Filled 2023-10-26: qty 1
  Filled 2023-10-26: qty 2
  Filled 2023-10-26 (×3): qty 1

## 2023-10-26 MED ORDER — LORAZEPAM 0.5 MG PO TABS
0.5000 mg | ORAL_TABLET | Freq: Four times a day (QID) | ORAL | Status: DC | PRN
  Filled 2023-10-26: qty 1

## 2023-10-26 NOTE — Progress Notes (Signed)
 PROGRESS NOTE    Natalie Edwards  FMW:[id number] DOB: 02/04/1966 DOA: 10/22/2023 PCP: Tanda Bleacher, MD   Brief Narrative:  58 year old female with history of diverticular disease with recent recurrent diverticulitis treated with few rounds of antibiotics, hypertension, renal mass, GERD/peptic ulcer disease, polysubstance use including alcohol and cocaine use, major depressive disorder and suicide attempts presented with worsening abdominal pain with recent multiple visits to the ED in the last few months for abdominal pain.  On presentation, WBC was 13.5.  CT of the abdomen and pelvis with contrast on 10/21/2023 had shown diverticulosis with possible acute sigmoid diverticulitis.  She was started on IV fluids and antibiotics.  General surgery was consulted.  GI was also consulted for persistent abdominal pain.  Assessment & Plan:   Recurrent acute diverticulitis -Patient has had treatment with few rounds of antibiotics for recurrent diverticulitis over the last few months and was supposed to be referred to general surgery as an outpatient. -Presented with worsening abdominal pain and CT recently was as above. - General Surgery and GI following.  Diet as per general surgery and GI recommendations.  Continue IV fluids and antibiotics.    Polysubstance use including alcohol and cocaine abuse - Urine drug screen positive for benzodiazepines and opiates. - Showing no signs of withdrawal.  Currently on CIWA protocol.  Continue multivitamin, thiamine  and folic acid   Major depressive disorder with history of suicidal ideation - Psychiatry consulted on 10/23/2023: Continue BuSpar  and fluoxetine .  Risperidone  stopped by psychiatry.  Currently on as needed Ativan   Tobacco abuse - Tobacco cessation counseling done  GERD/peptic ulcer disease - Continue IV Protonix   Leukocytosis - Monitor.  Labs pending today.  Hypomagnesemia - Labs pending today.  Monitor  Anemia of chronic disease - From chronic  illnesses.  Hemoglobin stable.  Monitor intermittently  Thrombocytosis -Mostly reactive.  Monitor intermittently  Mildly elevated LFTs - Resolved  Obesity - Outpatient follow-up   DVT prophylaxis: Heparin  subcutaneous Code Status: Full Family Communication: None at bedside Disposition Plan: Status is: Inpatient Remains inpatient appropriate because: Of severity of illness    Consultants: General Surgery/psychiatry/GI  Procedures: None  Antimicrobials: Zosyn  from 10/22/2023 onwards   Subjective: Patient seen and examined at bedside.  Continues to have intermittent abdominal pain with nausea.  She wants to have surgery done.  No chest pain, shortness breath, fever reported.   Objective: Vitals:   10/25/23 2039 10/25/23 2353 10/26/23 0414 10/26/23 0426  BP: (!) 180/85 133/78 (!) 141/71   Pulse: 73 81 75   Resp: 18 18 18    Temp: 98 F (36.7 C)  98.1 F (36.7 C)   TempSrc: Oral  Oral   SpO2: 100% 96% 94%   Weight:    85.5 kg  Height:        Intake/Output Summary (Last 24 hours) at 10/26/2023 0757 Last data filed at 10/26/2023 0400 Gross per 24 hour  Intake 2492.43 ml  Output --  Net 2492.43 ml   Filed Weights   10/22/23 1011 10/25/23 0500 10/26/23 0426  Weight: 81.6 kg 88.3 kg 85.5 kg    Examination:  General: Chronically ill and deconditioned looking.  No distress currently and on room air.   ENT/neck: No palpable thyromegaly or elevated JVD noted  respiratory: Decreased breath sounds at bases bilaterally with some crackles CVS: S1-S2 heard; rate mostly controlled abdominal: Soft, tender in the left lower quadrant; mildly distended; no organomegaly, normal bowel sounds are heard Extremities: Trace lower extremity edema present; no cyanosis CNS: Awake; still  very slow to respond and a poor historian.  No obvious focal neurologic deficit.  Lymph: No lymphadenopathy palpable Skin: No obvious ecchymosis/rashes psych: Extremely flat affect.  Looks anxious  intermittently.   Musculoskeletal: No obvious joint swelling/tenderness    Data Reviewed: I have personally reviewed following labs and imaging studies  CBC: Recent Labs  Lab 10/20/23 1940 10/22/23 1041 10/23/23 0610 10/24/23 0443 10/25/23 0723  WBC 17.8* 13.5* 11.9* 13.0* 14.0*  NEUTROABS 12.8* 10.3*  --  8.4* 8.6*  HGB 11.3* 11.5* 10.7* 11.0* 11.3*  HCT 34.7* 35.8* 33.0* 33.5* 34.0*  MCV 81.6 81.2 81.5 79.6* 80.4  PLT 492* 551* 502* 575* 578*   Basic Metabolic Panel: Recent Labs  Lab 10/20/23 1940 10/22/23 1041 10/23/23 0610 10/24/23 0443 10/25/23 0723  NA 140 137 142 137 138  K 4.4 3.9 3.9 3.7 3.6  CL 102 102 104 102 102  CO2 24 25 23 22  21*  GLUCOSE 97 111* 83 74 59*  BUN 15 11 9 8 9   CREATININE 1.03* 1.18* 1.08* 1.17* 1.10*  CALCIUM  9.1 9.4 9.1 9.2 9.1  MG  --   --   --  1.8 1.6*   GFR: Estimated Creatinine Clearance: 58.2 mL/min (A) (by C-G formula based on SCr of 1.1 mg/dL (H)). Liver Function Tests: Recent Labs  Lab 10/20/23 1940 10/22/23 1041 10/23/23 0610 10/24/23 0443 10/25/23 0723  AST 28 31 50* 52* 30  ALT 32 33 44 51* 39  ALKPHOS 79 73 67 67 63  BILITOT 0.4 0.2 0.5 0.8 0.8  PROT 7.4 7.6 6.8 6.7 7.0  ALBUMIN  3.4* 3.6 3.2* 3.2* 3.1*   Recent Labs  Lab 10/20/23 1940 10/22/23 1041  LIPASE 35 29   No results for input(s): AMMONIA in the last 168 hours. Coagulation Profile: No results for input(s): INR, PROTIME in the last 168 hours. Cardiac Enzymes: No results for input(s): CKTOTAL, CKMB, CKMBINDEX, TROPONINI in the last 168 hours. BNP (last 3 results) No results for input(s): PROBNP in the last 8760 hours. HbA1C: No results for input(s): HGBA1C in the last 72 hours. CBG: No results for input(s): GLUCAP in the last 168 hours. Lipid Profile: No results for input(s): CHOL, HDL, LDLCALC, TRIG, CHOLHDL, LDLDIRECT in the last 72 hours. Thyroid  Function Tests: No results for input(s): TSH, T4TOTAL,  FREET4, T3FREE, THYROIDAB in the last 72 hours. Anemia Panel: No results for input(s): VITAMINB12, FOLATE, FERRITIN, TIBC, IRON , RETICCTPCT in the last 72 hours. Sepsis Labs: Recent Labs  Lab 10/22/23 1249  LATICACIDVEN 0.7    No results found for this or any previous visit (from the past 240 hours).       Radiology Studies: No results found.      Scheduled Meds:  busPIRone   10 mg Oral BID   FLUoxetine   20 mg Oral Daily   folic acid   1 mg Oral Daily   heparin   5,000 Units Subcutaneous Q12H   multivitamin with minerals  1 tablet Oral Daily   nicotine   21 mg Transdermal Daily   pantoprazole  (PROTONIX ) IV  40 mg Intravenous Q12H   sodium chloride  flush  3 mL Intravenous Q12H   thiamine   100 mg Oral Daily   Or   thiamine   100 mg Intravenous Daily   Continuous Infusions:  lactated ringers  50 mL/hr at 10/26/23 0412   piperacillin -tazobactam (ZOSYN )  IV 12.5 mL/hr at 10/26/23 0400          Sophie Mao, MD [facility] 10/26/2023, 7:57 AM

## 2023-10-26 NOTE — Progress Notes (Signed)
   Subjective/Chief Complaint: PT with con't pain today   Objective: Vital signs in last 24 hours: Temp:  [98 F (36.7 C)-98.4 F (36.9 C)] 98.1 F (36.7 C) (08/11 0414) Pulse Rate:  [73-85] 79 (08/11 0834) Resp:  [18] 18 (08/11 0414) BP: (133-180)/(71-102) 157/85 (08/11 0834) SpO2:  [92 %-100 %] 96 % (08/11 0834) Weight:  [85.5 kg] 85.5 kg (08/11 0426) Last BM Date : 10/25/23  Intake/Output from previous day: 08/10 0701 - 08/11 0700 In: 2492.4 [P.O.:120; I.V.:2222.5; IV Piggyback:150] Out: -  Intake/Output this shift: No intake/output data recorded.  PE:  Constitutional: No acute distress, conversant, appears states age. Eyes: Anicteric sclerae, moist conjunctiva, no lid lag Lungs: Clear to auscultation bilaterally, normal respiratory effort CV: regular rate and rhythm, no murmurs, no peripheral edema, pedal pulses 2+ GI: Soft, no masses or hepatosplenomegaly, tender to palpation LLQ Skin: No rashes, palpation reveals normal turgor Psychiatric: appropriate judgment and insight, oriented to person, place, and time    Lab Results:  Recent Labs    10/24/23 0443 10/25/23 0723  WBC 13.0* 14.0*  HGB 11.0* 11.3*  HCT 33.5* 34.0*  PLT 575* 578*   BMET Recent Labs    10/24/23 0443 10/25/23 0723  NA 137 138  K 3.7 3.6  CL 102 102  CO2 22 21*  GLUCOSE 74 59*  BUN 8 9  CREATININE 1.17* 1.10*  CALCIUM  9.2 9.1  Anti-infectives: Anti-infectives (From admission, onward)    Start     Dose/Rate Route Frequency Ordered Stop   10/23/23 0000  piperacillin -tazobactam (ZOSYN ) IVPB 3.375 g       Placed in Followed by Linked Group   3.375 g 12.5 mL/hr over 240 Minutes Intravenous Every 8 hours 10/22/23 1727     10/22/23 1815  piperacillin -tazobactam (ZOSYN ) IVPB 3.375 g       Placed in Followed by Linked Group   3.375 g 100 mL/hr over 30 Minutes Intravenous  Once 10/22/23 1727 10/22/23 1853   10/22/23 1030  ciprofloxacin  (CIPRO ) IVPB 400 mg        400 mg 200  mL/hr over 60 Minutes Intravenous  Once 10/22/23 1028 10/22/23 1241   10/22/23 1030  metroNIDAZOLE  (FLAGYL ) IVPB 500 mg        500 mg 100 mL/hr over 60 Minutes Intravenous  Once 10/22/23 1028 10/22/23 1315       Assessment/Plan: 24F with recurrent diverticulitis -con't abx -con't NPO -Pt states she wants surgery because she's tired of waiting and having pain.  I d/w her that I will d.w Dr. Lyndel.  LOS: 4 days    Natalie Edwards 10/26/2023

## 2023-10-26 NOTE — Plan of Care (Signed)

## 2023-10-27 ENCOUNTER — Inpatient Hospital Stay (HOSPITAL_COMMUNITY): Admitting: Anesthesiology

## 2023-10-27 ENCOUNTER — Encounter (HOSPITAL_COMMUNITY)
Admission: EM | Disposition: A | Discharge status: Home / Self Care | Payer: Self-pay | Source: Home / Self Care | Attending: Surgery

## 2023-10-27 ENCOUNTER — Other Ambulatory Visit: Payer: Self-pay

## 2023-10-27 ENCOUNTER — Encounter (HOSPITAL_COMMUNITY): Payer: Self-pay | Admitting: Internal Medicine

## 2023-10-27 ENCOUNTER — Ambulatory Visit: Admitting: Physical Therapy

## 2023-10-27 DIAGNOSIS — K5792 Diverticulitis of intestine, part unspecified, without perforation or abscess without bleeding: Secondary | ICD-10-CM | POA: Diagnosis not present

## 2023-10-27 DIAGNOSIS — K5732 Diverticulitis of large intestine without perforation or abscess without bleeding: Secondary | ICD-10-CM | POA: Diagnosis not present

## 2023-10-27 HISTORY — PX: LAPAROSCOPIC SIGMOID COLECTOMY: SHX5928

## 2023-10-27 HISTORY — PX: MOBILIZATION, SPLENIC FLEXURE, WITH PARTIAL COLECTOMY: SHX7447

## 2023-10-27 LAB — SURGICAL PCR SCREEN
MRSA, PCR: NEGATIVE
Staphylococcus aureus: NEGATIVE

## 2023-10-27 LAB — ABO/RH: ABO/RH(D): B POS

## 2023-10-27 LAB — BASIC METABOLIC PANEL WITH GFR
Anion gap: 14 (ref 5–15)
BUN: 6 mg/dL (ref 6–20)
CO2: 24 mmol/L (ref 22–32)
Calcium: 9.2 mg/dL (ref 8.9–10.3)
Chloride: 100 mmol/L (ref 98–111)
Creatinine, Ser: 1.07 mg/dL — ABNORMAL HIGH (ref 0.44–1.00)
GFR, Estimated: 60 mL/min — ABNORMAL LOW (ref >60)
Glucose, Bld: 138 mg/dL — ABNORMAL HIGH (ref 70–99)
Potassium: 3.5 mmol/L (ref 3.5–5.1)
Sodium: 138 mmol/L (ref 135–145)

## 2023-10-27 LAB — TYPE AND SCREEN
ABO/RH(D): B POS
Antibody Screen: NEGATIVE

## 2023-10-27 LAB — MAGNESIUM: Magnesium: 1.7 mg/dL (ref 1.7–2.4)

## 2023-10-27 LAB — CBC WITH DIFFERENTIAL/PLATELET
Abs Immature Granulocytes: 0.09 K/uL — ABNORMAL HIGH (ref 0.00–0.07)
Basophils Absolute: 0 K/uL (ref 0.0–0.1)
Basophils Relative: 0 %
Eosinophils Absolute: 0.2 K/uL (ref 0.0–0.5)
Eosinophils Relative: 2 %
HCT: 35.1 % — ABNORMAL LOW (ref 36.0–46.0)
Hemoglobin: 11.4 g/dL — ABNORMAL LOW (ref 12.0–15.0)
Immature Granulocytes: 1 %
Lymphocytes Relative: 21 %
Lymphs Abs: 3.1 K/uL (ref 0.7–4.0)
MCH: 26.1 pg (ref 26.0–34.0)
MCHC: 32.5 g/dL (ref 30.0–36.0)
MCV: 80.5 fL (ref 80.0–100.0)
Monocytes Absolute: 1.5 K/uL — ABNORMAL HIGH (ref 0.1–1.0)
Monocytes Relative: 10 %
Neutro Abs: 9.8 K/uL — ABNORMAL HIGH (ref 1.7–7.7)
Neutrophils Relative %: 66 %
Platelets: 582 K/uL — ABNORMAL HIGH (ref 150–400)
RBC: 4.36 MIL/uL (ref 3.87–5.11)
RDW: 16.7 % — ABNORMAL HIGH (ref 11.5–15.5)
WBC: 14.8 K/uL — ABNORMAL HIGH (ref 4.0–10.5)
nRBC: 0 % (ref 0.0–0.2)

## 2023-10-27 SURGERY — COLECTOMY, SIGMOID, LAPAROSCOPIC
Anesthesia: General | Site: Abdomen

## 2023-10-27 MED ORDER — ORAL CARE MOUTH RINSE: 15.0000 mL | Freq: Once | OROMUCOSAL | Status: AC

## 2023-10-27 MED ORDER — 0.9 % SODIUM CHLORIDE (POUR BTL) OPTIME
TOPICAL | Status: DC | PRN
  Administered 2023-10-27 (×2): 2000 mL

## 2023-10-27 MED ORDER — SODIUM CHLORIDE 0.9 % IR SOLN
Status: DC | PRN
  Administered 2023-10-27 (×2): 1

## 2023-10-27 MED ORDER — EPHEDRINE SULFATE-NACL 50-0.9 MG/10ML-% IV SOSY
PREFILLED_SYRINGE | INTRAVENOUS | Status: DC | PRN
  Administered 2023-10-27 (×4): 10 mg via INTRAVENOUS

## 2023-10-27 MED ORDER — ALBUMIN HUMAN 5 % IV SOLN: INTRAVENOUS | Status: DC | PRN

## 2023-10-27 MED ORDER — SODIUM CHLORIDE (PF) 0.9 % IJ SOLN
INTRAMUSCULAR | Status: AC
Start: 2023-10-27 — End: 2023-10-27
  Administered 2023-10-27 (×2): 10 mL
  Filled 2023-10-27: qty 10

## 2023-10-27 MED ORDER — ONDANSETRON HCL 4 MG/2ML IJ SOLN
INTRAMUSCULAR | Status: DC | PRN
  Administered 2023-10-27 (×2): 4 mg via INTRAVENOUS

## 2023-10-27 MED ORDER — MIDAZOLAM HCL 2 MG/2ML IJ SOLN
INTRAMUSCULAR | Status: DC | PRN
  Administered 2023-10-27 (×4): 1 mg via INTRAVENOUS

## 2023-10-27 MED ORDER — BUPIVACAINE-EPINEPHRINE 0.25% -1:200000 IJ SOLN
INTRAMUSCULAR | Status: DC | PRN
  Administered 2023-10-27 (×2): 10 mL

## 2023-10-27 MED ORDER — HYDROMORPHONE HCL 1 MG/ML IJ SOLN
0.2500 mg | INTRAMUSCULAR | Status: DC | PRN
  Administered 2023-10-27 (×4): 0.5 mg via INTRAVENOUS

## 2023-10-27 MED ORDER — CHLORHEXIDINE GLUCONATE 0.12 % MT SOLN
15.0000 mL | Freq: Once | OROMUCOSAL | Status: AC
  Administered 2023-10-27 (×2): 15 mL via OROMUCOSAL
  Filled 2023-10-27 (×2): qty 15

## 2023-10-27 MED ORDER — FENTANYL CITRATE (PF) 250 MCG/5ML IJ SOLN
INTRAMUSCULAR | Status: DC | PRN
  Administered 2023-10-27: 50 ug via INTRAVENOUS
  Administered 2023-10-27: 100 ug via INTRAVENOUS
  Administered 2023-10-27: 50 ug via INTRAVENOUS
  Administered 2023-10-27 (×3): 100 ug via INTRAVENOUS

## 2023-10-27 MED ORDER — KETAMINE HCL 50 MG/5ML IJ SOSY
PREFILLED_SYRINGE | INTRAMUSCULAR | Status: AC
Start: 2023-10-27 — End: 2023-10-27
  Filled 2023-10-27: qty 5

## 2023-10-27 MED ORDER — PHENYLEPHRINE 80 MCG/ML (10ML) SYRINGE FOR IV PUSH (FOR BLOOD PRESSURE SUPPORT)
PREFILLED_SYRINGE | INTRAVENOUS | Status: DC | PRN
  Administered 2023-10-27 (×2): 160 ug via INTRAVENOUS

## 2023-10-27 MED ORDER — ONDANSETRON HCL 4 MG/2ML IJ SOLN: 4.0000 mg | Freq: Once | INTRAMUSCULAR | Status: DC | PRN

## 2023-10-27 MED ORDER — BUPIVACAINE-EPINEPHRINE (PF) 0.25% -1:200000 IJ SOLN
INTRAMUSCULAR | Status: AC
  Filled 2023-10-27: qty 30

## 2023-10-27 MED ORDER — OXYCODONE HCL 5 MG/5ML PO SOLN: 5.0000 mg | Freq: Once | ORAL | Status: DC | PRN

## 2023-10-27 MED ORDER — FLUCONAZOLE 150 MG PO TABS
150.0000 mg | ORAL_TABLET | Freq: Once | ORAL | Status: AC
  Administered 2023-10-27 (×2): 150 mg via ORAL
  Filled 2023-10-27: qty 1

## 2023-10-27 MED ORDER — KETAMINE HCL 10 MG/ML IJ SOLN
INTRAMUSCULAR | Status: DC | PRN
  Administered 2023-10-27: 20 mg via INTRAVENOUS
  Administered 2023-10-27 (×2): 30 mg via INTRAVENOUS
  Administered 2023-10-27: 20 mg via INTRAVENOUS

## 2023-10-27 MED ORDER — LIDOCAINE 2% (20 MG/ML) 5 ML SYRINGE
INTRAMUSCULAR | Status: AC
  Filled 2023-10-27: qty 5

## 2023-10-27 MED ORDER — PROPOFOL 10 MG/ML IV BOLUS
INTRAVENOUS | Status: DC | PRN
  Administered 2023-10-27 (×2): 110 mg via INTRAVENOUS

## 2023-10-27 MED ORDER — ONDANSETRON HCL 4 MG/2ML IJ SOLN
4.0000 mg | Freq: Four times a day (QID) | INTRAMUSCULAR | Status: DC | PRN
  Administered 2023-10-27 – 2023-10-28 (×4): 4 mg via INTRAVENOUS
  Filled 2023-10-27 (×2): qty 2

## 2023-10-27 MED ORDER — OXYCODONE HCL 5 MG PO TABS: 5.0000 mg | ORAL_TABLET | Freq: Once | ORAL | Status: DC | PRN

## 2023-10-27 MED ORDER — DEXMEDETOMIDINE HCL IN NACL 80 MCG/20ML IV SOLN
INTRAVENOUS | Status: DC | PRN
  Administered 2023-10-27 (×4): 8 ug via INTRAVENOUS

## 2023-10-27 MED ORDER — SUGAMMADEX SODIUM 200 MG/2ML IV SOLN
INTRAVENOUS | Status: DC | PRN
  Administered 2023-10-27 (×2): 200 mg via INTRAVENOUS

## 2023-10-27 MED ORDER — DEXAMETHASONE SODIUM PHOSPHATE 10 MG/ML IJ SOLN
INTRAMUSCULAR | Status: DC | PRN
  Administered 2023-10-27 (×2): 5 mg via INTRAVENOUS

## 2023-10-27 MED ORDER — PROPOFOL 10 MG/ML IV BOLUS
INTRAVENOUS | Status: AC
  Filled 2023-10-27: qty 20

## 2023-10-27 MED ORDER — AMISULPRIDE (ANTIEMETIC) 5 MG/2ML IV SOLN: 10.0000 mg | Freq: Once | INTRAVENOUS | Status: DC | PRN

## 2023-10-27 MED ORDER — HYDROMORPHONE HCL 1 MG/ML IJ SOLN
INTRAMUSCULAR | Status: AC
  Filled 2023-10-27: qty 1

## 2023-10-27 MED ORDER — FENTANYL CITRATE (PF) 250 MCG/5ML IJ SOLN
INTRAMUSCULAR | Status: AC
  Filled 2023-10-27: qty 5

## 2023-10-27 MED ORDER — LACTATED RINGERS IV SOLN: INTRAVENOUS | Status: DC

## 2023-10-27 MED ORDER — ROCURONIUM BROMIDE 10 MG/ML (PF) SYRINGE
PREFILLED_SYRINGE | INTRAVENOUS | Status: DC | PRN
  Administered 2023-10-27: 50 mg via INTRAVENOUS
  Administered 2023-10-27: 10 mg via INTRAVENOUS
  Administered 2023-10-27: 20 mg via INTRAVENOUS
  Administered 2023-10-27: 10 mg via INTRAVENOUS
  Administered 2023-10-27: 20 mg via INTRAVENOUS
  Administered 2023-10-27: 50 mg via INTRAVENOUS
  Administered 2023-10-27 (×2): 20 mg via INTRAVENOUS

## 2023-10-27 MED ORDER — ACETAMINOPHEN 500 MG PO TABS: 1000.0000 mg | ORAL_TABLET | Freq: Once | ORAL | Status: DC

## 2023-10-27 MED ORDER — ACETAMINOPHEN 10 MG/ML IV SOLN
INTRAVENOUS | Status: AC
  Filled 2023-10-27: qty 100

## 2023-10-27 MED ORDER — HYDROMORPHONE HCL 1 MG/ML IJ SOLN
INTRAMUSCULAR | Status: AC
  Filled 2023-10-27: qty 0.5

## 2023-10-27 MED ORDER — ACETAMINOPHEN 10 MG/ML IV SOLN
INTRAVENOUS | Status: DC | PRN
  Administered 2023-10-27 (×2): 1000 mg via INTRAVENOUS

## 2023-10-27 MED ORDER — MIDAZOLAM HCL 2 MG/2ML IJ SOLN
INTRAMUSCULAR | Status: AC
  Filled 2023-10-27: qty 2

## 2023-10-27 MED ORDER — ROCURONIUM BROMIDE 10 MG/ML (PF) SYRINGE
PREFILLED_SYRINGE | INTRAVENOUS | Status: AC
  Filled 2023-10-27: qty 10

## 2023-10-27 SURGICAL SUPPLY — 67 items
CANISTER SUCTION 3000ML PPV (SUCTIONS) ×1 IMPLANT
CHLORAPREP W/TINT 26 (MISCELLANEOUS) ×1 IMPLANT
CLIP APPLIE 5 13 M/L LIGAMAX5 (MISCELLANEOUS) IMPLANT
CLIP APPLIE ROT 10 11.4 M/L (STAPLE) IMPLANT
CLIP LIGATING HEMO O LOK GREEN (MISCELLANEOUS) IMPLANT
COVER SURGICAL LIGHT HANDLE (MISCELLANEOUS) ×2 IMPLANT
DERMABOND ADVANCED .7 DNX12 (GAUZE/BANDAGES/DRESSINGS) IMPLANT
DRAIN PENROSE 12X.25 LTX STRL (MISCELLANEOUS) IMPLANT
DRSG COVADERM PLUS 2X2 (GAUZE/BANDAGES/DRESSINGS) IMPLANT
DRSG OPSITE POSTOP 4X10 (GAUZE/BANDAGES/DRESSINGS) IMPLANT
DRSG OPSITE POSTOP 4X8 (GAUZE/BANDAGES/DRESSINGS) IMPLANT
ELECT BLADE 6.5 EXT (BLADE) ×1 IMPLANT
ELECT CAUTERY BLADE 6.4 (BLADE) ×1 IMPLANT
ELECTRODE REM PT RTRN 9FT ADLT (ELECTROSURGICAL) ×1 IMPLANT
GAUZE SPONGE 4X4 12PLY STRL (GAUZE/BANDAGES/DRESSINGS) IMPLANT
GLOVE BIO SURGEON STRL SZ7.5 (GLOVE) ×2 IMPLANT
GLOVE BIOGEL PI IND STRL 8 (GLOVE) ×2 IMPLANT
GOWN STRL REUS W/ TWL LRG LVL3 (GOWN DISPOSABLE) ×6 IMPLANT
GOWN STRL REUS W/ TWL XL LVL3 (GOWN DISPOSABLE) ×2 IMPLANT
GRASPER SUT TROCAR 14GX15 (MISCELLANEOUS) IMPLANT
HANDLE SUCTION POOLE (INSTRUMENTS) IMPLANT
IRRIGATION SUCT STRKRFLW 2 WTP (MISCELLANEOUS) ×1 IMPLANT
KIT SIGMOIDOSCOPE (SET/KITS/TRAYS/PACK) ×1 IMPLANT
LIGASURE LAP MARYLAND 5MM 37CM (ELECTROSURGICAL) IMPLANT
NDL 22X1.5 STRL (OR ONLY) (MISCELLANEOUS) ×1 IMPLANT
NEEDLE 22X1.5 STRL (OR ONLY) (MISCELLANEOUS) ×1 IMPLANT
NS IRRIG 1000ML POUR BTL (IV SOLUTION) ×2 IMPLANT
PACK COLON (CUSTOM PROCEDURE TRAY) ×1 IMPLANT
PAD ARMBOARD POSITIONER FOAM (MISCELLANEOUS) ×2 IMPLANT
RELOAD STAPLE 60 3.6 BLU REG (STAPLE) IMPLANT
RELOAD STAPLE 60 4.1 GRN THCK (STAPLE) IMPLANT
SEALER TISSUE G2 CVD JAW 45CM (ENDOMECHANICALS) IMPLANT
SET TUBE SMOKE EVAC HIGH FLOW (TUBING) ×1 IMPLANT
SHEARS HARMONIC 36 ACE (MISCELLANEOUS) IMPLANT
SLEEVE Z-THREAD 5X100MM (TROCAR) ×1 IMPLANT
SPECIMEN JAR LARGE (MISCELLANEOUS) ×1 IMPLANT
STAPLE ECHEON FLEX 60 POW ENDO (STAPLE) IMPLANT
STAPLER CIRCULAR MANUAL XL 29 (STAPLE) IMPLANT
STAPLER SKIN PROX 35W (STAPLE) IMPLANT
SURGILUBE 2OZ TUBE FLIPTOP (MISCELLANEOUS) ×1 IMPLANT
SUT MNCRL AB 4-0 PS2 18 (SUTURE) IMPLANT
SUT PDS AB 1 TP1 96 (SUTURE) IMPLANT
SUT PDS AB 2-0 CT1 27 (SUTURE) IMPLANT
SUT PDS PLUS AB 0 CT-2 (SUTURE) IMPLANT
SUT PROLENE 2 0 CT2 30 (SUTURE) IMPLANT
SUT PROLENE 2 0 KS (SUTURE) IMPLANT
SUT SILK 2 0 SH CR/8 (SUTURE) ×1 IMPLANT
SUT SILK 2 0 TIES 10X30 (SUTURE) ×1 IMPLANT
SUT SILK 3 0 SH CR/8 (SUTURE) ×1 IMPLANT
SUT SILK 3 0 TIES 10X30 (SUTURE) ×1 IMPLANT
SUT VIC AB 0 CT2 27 (SUTURE) IMPLANT
SUT VIC AB 2-0 SH 18 (SUTURE) IMPLANT
SUT VIC AB 3-0 SH 18 (SUTURE) ×1 IMPLANT
SUT VIC AB 3-0 SH 8-18 (SUTURE) IMPLANT
SUT VICRYL 0 UR6 27IN ABS (SUTURE) IMPLANT
SYR BULB IRRIG 60ML STRL (SYRINGE) ×1 IMPLANT
SYSTEM LAPSCP GELPORT 120MM (MISCELLANEOUS) ×1 IMPLANT
TOWEL GREEN STERILE (TOWEL DISPOSABLE) ×2 IMPLANT
TRAY FOLEY MTR SLVR 16FR STAT (SET/KITS/TRAYS/PACK) ×1 IMPLANT
TROCAR 11X100 Z THREAD (TROCAR) IMPLANT
TROCAR BALLN 12MMX100 BLUNT (TROCAR) IMPLANT
TROCAR XCEL NON-BLD 5MMX100MML (ENDOMECHANICALS) ×1 IMPLANT
TROCAR Z THREAD OPTICAL 12X100 (TROCAR) ×1 IMPLANT
TUBE CONNECTING 12X1/4 (SUCTIONS) ×2 IMPLANT
WARMER LAPAROSCOPE (MISCELLANEOUS) ×1 IMPLANT
WATER STERILE IRR 1000ML POUR (IV SOLUTION) ×1 IMPLANT
YANKAUER SUCT BULB TIP NO VENT (SUCTIONS) IMPLANT

## 2023-10-27 NOTE — Transfer of Care (Signed)
 Immediate Anesthesia Transfer of Care Note  Patient: Natalie Edwards  Procedure(s) Performed: COLECTOMY, SIGMOID, LAPAROSCOPIC (Abdomen) MOBILIZATION, SPLENIC FLEXURE (Abdomen)  Patient Location: PACU  Anesthesia Type:General  Level of Consciousness: awake, alert , and oriented  Airway & Oxygen Therapy: Patient Spontanous Breathing and Patient connected to nasal cannula oxygen  Post-op Assessment: Report given to RN and Post -op Vital signs reviewed and stable  Post vital signs: Reviewed and stable  Last Vitals:  Vitals Value Taken Time  BP 184/85 10/27/23 17:03  Temp    Pulse 76 10/27/23 17:05  Resp 16 10/27/23 17:05  SpO2 94 % 10/27/23 17:05  Vitals shown include unfiled device data.  Last Pain:  Vitals:   10/27/23 1234  TempSrc:   PainSc: 5       Patients Stated Pain Goal: 2 (10/27/23 1234)  Complications: No notable events documented.

## 2023-10-27 NOTE — Op Note (Signed)
 Patient: Natalie Edwards (1965/06/09, [id number])  Date of Surgery: 10/27/2023  Preoperative Diagnosis: Diverticulitis   Postoperative Diagnosis: Diverticulitis   Surgical Procedure:  COLECTOMY, SIGMOID, LAPAROSCOPIC MOBILIZATION, SPLENIC FLEXURE:     Operative Team Members:  Surgeons and Role:    * Siddalee Vanderheiden, Deward PARAS, MD - Primary   Anesthesiologist: Lucious Debby BRAVO, MD; Darlyn Rush, MD CRNA: Atanacio Arland HERO, CRNA; Lockie Flesher, CRNA   Anesthesia: General   Fluids:  Total I/O In: 1903.8 [I.V.:1300; IV Piggyback:603.8] Out: 945 [Urine:595; Emesis/NG output:150; Blood:200]  Complications: None  Drains:  none   Specimen:  ID Type Source Tests Collected by Time Destination  1 : Sigmoid colon Tissue PATH GI Other SURGICAL PATHOLOGY Louanne Calvillo, Deward PARAS, MD 10/27/2023 1502      Disposition:  PACU - hemodynamically stable.  Plan of Care: Continue inpatient care    Indications for Procedure: Natalie Edwards is a 58 y.o. female who presented with abdominal pain.  Imaging was consistent with sigmoid diverticulitis.  I recommended laparoscopic sigmoid colectomy with possible ostomy.  We discussed the procedure itself as well as its risk, benefits, and alternatives.  The risks discussed included but were not limited to the risk of infection, bleeding, damage to nearby structures, and anastomotic leak.  After a full discussion and all questions answered the patient granted consent to proceed.  Findings: Inflamed sigmoid colon with adhesions to the small bowel appearing to cause some ischemia that resolved once the adhesions were lysed.   Description of Procedure:   I date stated by the patient taken operating suite.  General endotracheal anesthesia was induced.  The patient was placed in lithotomy position.  The abdomen was prepped and draped in usual sterile fashion.  Time was completed verifying the correct patient, procedure, position, and equipment needed for the  case.  I made a subcostal 5 mm incision in the right upper quadrant.  I inserted a 5 mm trocar under optical guidance into the abdomen and inflated the abdomen to 15 mmHg.  3 additional trocars were placed 1 subxiphoid 1 in the right lower quadrant.  These were all 5 mm trocars and the right lower quadrant trocar was later upsized to a 12 mm trocar for the stapler.  The patient was placed in Trendelenburg and right side down position.  The sigmoid colon was grasped and elevated.  The sigmoid artery was clearly identified.  The mesentery was dissected free around the sigmoid artery and was isolated.  It was clipped twice proximally once distally and then divided using the LigaSure.  I then worked to mobilize the sigmoid colon.  First I worked to divide the mesentery medially until I identified the left ureter.  The left ureter was identified and protected throughout the case.  With the medial portion of the dissection complete I then worked to mobilize the colon off the pelvic sidewall and out of the retroperitoneum.  I divided the white line of Toldt working proximally and distally.  The descending colon was fully mobilized out of the retroperitoneum.  I then divided the sigmoid colon mesentery down to the rectosigmoid junction.  A blue load of the endoscopic linear stapler was used to divide the proximal aspect of the rectum.  This was identified by the splaying of the tinea.  2 loads were required to ensure adequate division of the proximal rectum.  With this division complete I then created a Pfannenstiel incision by horizontally incising the skin and anterior rectus fascia and then vertically incised and the  peritoneum to enter the lower abdomen.  The sigmoid colon was able to be delivered through this and a location proximal to the diseased segment was selected for a Prolene pursestring suture to be placed.  This was reinforced with multiple Vicryl sutures circumferentially in the wall of the bowel near the  pursestring was cleared of fatty tissue using the bipolar.  Dilators were used to size the intestine.  A 29 mm EEA stapler was selected.  The anvil was brought into the field and placed in the divided end of the descending colon.  The pursestring was tied down around the anvil.  I then returned the descending colon back to the abdomen and placed the Alexis wound protector through the Pfannenstiel incision.  A cyst GelPort was placed as a cover and the abdomen was reinflated.  I tested the tension of the descending colon region down to the pelvis.  It seemed to have too much tension to create the anastomosis so I decided to mobilize the splenic flexure.  The patient was placed in reverse Trendelenburg positioning.  I worked to divide the [employer] of Toldt proximally up the descending colon and divided the descending colon's attachments to the retroperitoneum.  As I got to the splenic flexure this got more difficult so I also worked from the transverse colon and lifted the omentum off of the transverse colon using the LigaSure.  I used my hand through the GelPort to aid in this dissection.  I was careful to protect the spleen and the splenic flexure of the colon throughout this portion of the dissection.  The splenic flexure was able to be fully mobilized by working from both these directions and the splenocolic ligament was completely divided.  The splenic flexure and descending colon was lifted out of the retroperitoneum by dividing the few more attachments using the LigaSure.  With the descending colon now fully medialized I again tested the tension and there appeared to be no tension to create the anastomosis.  The patient was placed back in Trendelenburg positioning.  I then went below to fired the stapler and formed the anastomosis.  The dilators were first introduced to the rectal staple line at the proximal rectum.  The 29 mm EEA stapler was then advanced without difficulty to the proximal rectal staple  line.  The spike was deployed and the anvil was connected.  The EEA stapler was closed and fired.  I then remove the stapler and submerged the anastomosis with saline irrigation.  The rigid proctoscope was then inserted into the anus and air was inflated into the rectum.  There was no bubbling intra-abdominal I consistent with a negative leak test.  The rigid proctoscope was then discarded.  I returned to laparoscopic surgery.  At the beginning of the surgery I was a little concerned about a loop of small intestine that appeared to be caught in an adhesion related to the diverticulitis and appeared slightly ischemic.  I ran the small intestine from the terminal ileum to the ligament of Treitz and at this point everything appeared well-perfused.  We then switched to a clean tray for closure.  The Pfannenstiel incision was closed at the peritoneum using Vicryl suture, the anterior fascia using 0 PDS suture and the skin was closed using staples.  All sponge needle counts were correct the end of this case.  At the end of the case we reviewed the infection status of the case. Patient: [facility] Patient Case: Urgent  Infection Present At Time Of Surgery (PATOS): inflamed sigmoid colon  Deward Foy, MD General, Bariatric, & Minimally Invasive Surgery [facility], [place]

## 2023-10-27 NOTE — Progress Notes (Signed)
*   Day of Surgery *   Subjective/Chief Complaint: Fed up with this pain which continues in the left lower quadrant, wants to have surgery.  Finished a bowel prep yesterday and stool is running clearer.   Objective: Vital signs in last 24 hours: Temp:  [97.7 F (36.5 C)-98.1 F (36.7 C)] 98.1 F (36.7 C) (08/12 0428) Pulse Rate:  [73-93] 84 (08/12 0428) Resp:  [18] 18 (08/11 2216) BP: (138-171)/(72-84) 138/72 (08/12 0428) SpO2:  [89 %-94 %] 89 % (08/12 0428) Last BM Date : 10/26/23  Intake/Output from previous day: 08/11 0701 - 08/12 0700 In: 426.6 [I.V.:426.6] Out: 0  Intake/Output this shift: Total I/O In: 203.8 [IV Piggyback:203.8] Out: -   PE:  Constitutional: No acute distress, conversant, appears states age. Eyes: Anicteric sclerae, moist conjunctiva, no lid lag Lungs: Clear to auscultation bilaterally, normal respiratory effort CV: regular rate and rhythm, no murmurs, no peripheral edema, pedal pulses 2+ GI: Soft, no masses or hepatosplenomegaly, tender to palpation LLQ Skin: No rashes, palpation reveals normal turgor Psychiatric: appropriate judgment and insight, oriented to person, place, and time    Lab Results:  Recent Labs    10/25/23 0723 10/27/23 0454  WBC 14.0* 14.8*  HGB 11.3* 11.4*  HCT 34.0* 35.1*  PLT 578* 582*   BMET Recent Labs    10/25/23 0723 10/27/23 0454  NA 138 138  K 3.6 3.5  CL 102 100  CO2 21* 24  GLUCOSE 59* 138*  BUN 9 6  CREATININE 1.10* 1.07*  CALCIUM  9.1 9.2  Anti-infectives: Anti-infectives (From admission, onward)    Start     Dose/Rate Route Frequency Ordered Stop   10/27/23 0930  fluconazole  (DIFLUCAN ) tablet 150 mg        150 mg Oral  Once 10/27/23 9167     10/26/23 2200  neomycin  (MYCIFRADIN ) tablet 1,000 mg       Placed in And Linked Group   1,000 mg Oral 3 times per day 10/26/23 1905 10/27/23 2159   10/26/23 2200  metroNIDAZOLE  (FLAGYL ) tablet 1,000 mg       Placed in And Linked Group   1,000 mg Oral  3 times per day 10/26/23 1905 10/27/23 2159   10/23/23 0000  piperacillin -tazobactam (ZOSYN ) IVPB 3.375 g       Placed in Followed by Linked Group   3.375 g 12.5 mL/hr over 240 Minutes Intravenous Every 8 hours 10/22/23 1727     10/22/23 1815  piperacillin -tazobactam (ZOSYN ) IVPB 3.375 g       Placed in Followed by Linked Group   3.375 g 100 mL/hr over 30 Minutes Intravenous  Once 10/22/23 1727 10/22/23 1853   10/22/23 1030  ciprofloxacin  (CIPRO ) IVPB 400 mg        400 mg 200 mL/hr over 60 Minutes Intravenous  Once 10/22/23 1028 10/22/23 1241   10/22/23 1030  metroNIDAZOLE  (FLAGYL ) IVPB 500 mg        500 mg 100 mL/hr over 60 Minutes Intravenous  Once 10/22/23 1028 10/22/23 1315       Assessment/Plan: 37F with recurrent smoldering diverticulitis -Completed bowel prep yesterday - Plan for laparoscopic sigmoid colectomy today with possible colostomy. We discussed the procedure, its risks, benefits and alternatives and the patient granted consent to proceed. -Con't NPO -Pt states she wants surgery because she's tired of waiting and having pain.  I d/w her that I will d.w Dr. Lyndel.   LOS: 5 days    Deward PARAS Weber Monnier 10/27/2023

## 2023-10-27 NOTE — Progress Notes (Signed)
 PROGRESS NOTE    Natalie Edwards  FMW:[id number] DOB: 18-Jul-1965 DOA: 10/22/2023 PCP: Tanda Bleacher, MD   Brief Narrative:  58 year old female with history of diverticular disease with recent recurrent diverticulitis treated with few rounds of antibiotics, hypertension, renal mass, GERD/peptic ulcer disease, polysubstance use including alcohol and cocaine use, major depressive disorder and suicide attempts presented with worsening abdominal pain with recent multiple visits to the ED in the last few months for abdominal pain.  On presentation, WBC was 13.5.  CT of the abdomen and pelvis with contrast on 10/21/2023 had shown diverticulosis with possible acute sigmoid diverticulitis.  She was started on IV fluids and antibiotics.  General surgery was consulted.  GI was also consulted for persistent abdominal pain: I subsequently signed off.  General surgery planning for possible surgical intervention today.  Assessment & Plan:   Recurrent acute diverticulitis -Patient has had treatment with few rounds of antibiotics for recurrent diverticulitis over the last few months and was supposed to be referred to general surgery as an outpatient. -Presented with worsening abdominal pain and CT recently was as above. - General Surgery following: Planning for possible surgical intervention today.  Continue IV fluids and antibiotics.  GI has signed off.  Polysubstance use including alcohol and cocaine abuse - Urine drug screen positive for benzodiazepines and opiates. - Showing no signs of withdrawal.  Currently on CIWA protocol.  Continue multivitamin, thiamine  and folic acid   Major depressive disorder with history of suicidal ideation - Psychiatry consulted on 10/23/2023: Continue BuSpar  and fluoxetine .  Risperidone  stopped by psychiatry.  Currently on as needed Ativan .  Psychiatry has signed off.  Tobacco abuse - Tobacco cessation counseling done  GERD/peptic ulcer disease - Continue IV  Protonix   Leukocytosis - Monitor.    Hypomagnesemia - Improving  Anemia of chronic disease - From chronic illnesses.  Hemoglobin stable.  Monitor intermittently  Thrombocytosis -Mostly reactive.  Monitor intermittently  Mildly elevated LFTs - Resolved  Obesity - Outpatient follow-up   DVT prophylaxis: Heparin  subcutaneous Code Status: Full Family Communication: None at bedside Disposition Plan: Status is: Inpatient Remains inpatient appropriate because: Of severity of illness    Consultants: General Surgery/psychiatry/GI  Procedures: None  Antimicrobials: Zosyn  from 10/22/2023 onwards   Subjective: Patient seen and examined at bedside.  Still complains of severe intermittent abdominal pain requiring IV Dilaudid .  No fever, chest pain or shortness of breath reported. objective: Vitals:   10/26/23 1648 10/26/23 2216 10/27/23 0345 10/27/23 0428  BP: (!) 171/84 (!) 152/72 (!) 152/84 138/72  Pulse: 73 93 82 84  Resp:  18    Temp:  98 F (36.7 C) 97.7 F (36.5 C) 98.1 F (36.7 C)  TempSrc:   Oral Oral  SpO2: 94% 93% 93% (!) 89%  Weight:      Height:        Intake/Output Summary (Last 24 hours) at 10/27/2023 0757 Last data filed at 10/27/2023 0752 Gross per 24 hour  Intake 630.39 ml  Output 0 ml  Net 630.39 ml   Filed Weights   10/22/23 1011 10/25/23 0500 10/26/23 0426  Weight: 81.6 kg 88.3 kg 85.5 kg    Examination:  General: Chronically ill and deconditioned looking.  Currently on room air and in no distress.   ENT/neck: No JVD elevation or palpable neck masses noted  respiratory: Decreased breath sounds at bases with scattered crackles  CVS: Rate currently controlled; S1 and S2 are heard  abdominal: Soft, left lower quadrant tenderness present; distended slightly; no organomegaly, bowel  sounds heard  extremities: No clubbing; mild lower extremity edema present CNS: Alert; poor historian; remains slow to respond.  No focal deficits noted.  Lymph: No  obvious lymphadenopathy noted Skin: No obvious petechia/lesions psych: Currently not agitated.  Affect is flat.   Musculoskeletal: No obvious joint erythema/deformity    Data Reviewed: I have personally reviewed following labs and imaging studies  CBC: Recent Labs  Lab 10/20/23 1940 10/22/23 1041 10/23/23 0610 10/24/23 0443 10/25/23 0723 10/27/23 0454  WBC 17.8* 13.5* 11.9* 13.0* 14.0* 14.8*  NEUTROABS 12.8* 10.3*  --  8.4* 8.6* 9.8*  HGB 11.3* 11.5* 10.7* 11.0* 11.3* 11.4*  HCT 34.7* 35.8* 33.0* 33.5* 34.0* 35.1*  MCV 81.6 81.2 81.5 79.6* 80.4 80.5  PLT 492* 551* 502* 575* 578* 582*   Basic Metabolic Panel: Recent Labs  Lab 10/22/23 1041 10/23/23 0610 10/24/23 0443 10/25/23 0723 10/27/23 0454  NA 137 142 137 138 138  K 3.9 3.9 3.7 3.6 3.5  CL 102 104 102 102 100  CO2 25 23 22  21* 24  GLUCOSE 111* 83 74 59* 138*  BUN 11 9 8 9 6   CREATININE 1.18* 1.08* 1.17* 1.10* 1.07*  CALCIUM  9.4 9.1 9.2 9.1 9.2  MG  --   --  1.8 1.6* 1.7   GFR: Estimated Creatinine Clearance: 59.9 mL/min (A) (by C-G formula based on SCr of 1.07 mg/dL (H)). Liver Function Tests: Recent Labs  Lab 10/20/23 1940 10/22/23 1041 10/23/23 0610 10/24/23 0443 10/25/23 0723  AST 28 31 50* 52* 30  ALT 32 33 44 51* 39  ALKPHOS 79 73 67 67 63  BILITOT 0.4 0.2 0.5 0.8 0.8  PROT 7.4 7.6 6.8 6.7 7.0  ALBUMIN  3.4* 3.6 3.2* 3.2* 3.1*   Recent Labs  Lab 10/20/23 1940 10/22/23 1041  LIPASE 35 29   No results for input(s): AMMONIA in the last 168 hours. Coagulation Profile: No results for input(s): INR, PROTIME in the last 168 hours. Cardiac Enzymes: No results for input(s): CKTOTAL, CKMB, CKMBINDEX, TROPONINI in the last 168 hours. BNP (last 3 results) No results for input(s): PROBNP in the last 8760 hours. HbA1C: No results for input(s): HGBA1C in the last 72 hours. CBG: No results for input(s): GLUCAP in the last 168 hours. Lipid Profile: No results for input(s):  CHOL, HDL, LDLCALC, TRIG, CHOLHDL, LDLDIRECT in the last 72 hours. Thyroid  Function Tests: No results for input(s): TSH, T4TOTAL, FREET4, T3FREE, THYROIDAB in the last 72 hours. Anemia Panel: No results for input(s): VITAMINB12, FOLATE, FERRITIN, TIBC, IRON , RETICCTPCT in the last 72 hours. Sepsis Labs: Recent Labs  Lab 10/22/23 1249  LATICACIDVEN 0.7    Recent Results (from the past 240 hours)  Surgical pcr screen     Status: None   Collection Time: 10/27/23  3:32 AM   Specimen: Nasal Mucosa; Nasal Swab  Result Value Ref Range Status   MRSA, PCR NEGATIVE NEGATIVE Final   Staphylococcus aureus NEGATIVE NEGATIVE Final    Comment: (NOTE) The Xpert SA Assay (FDA approved for NASAL specimens in patients 65 years of age and older), is one component of a comprehensive surveillance program. It is not intended to diagnose infection nor to guide or monitor treatment. Performed at [facility], [address], [place], [place] [address]          Radiology Studies: No results found.      Scheduled Meds:  busPIRone   10 mg Oral BID   FLUoxetine   20 mg Oral Daily   folic acid   1 mg Oral Daily   heparin   5,000 Units Subcutaneous Q12H   neomycin   1,000 mg Oral 3 times per day   And   metroNIDAZOLE   1,000 mg Oral 3 times per day   multivitamin with minerals  1 tablet Oral Daily   nicotine   21 mg Transdermal Daily   pantoprazole  (PROTONIX ) IV  40 mg Intravenous Q12H   sodium chloride  flush  3 mL Intravenous Q12H   thiamine   100 mg Oral Daily   Or   thiamine   100 mg Intravenous Daily   Continuous Infusions:  lactated ringers  50 mL/hr at 10/27/23 0009   piperacillin -tazobactam (ZOSYN )  IV 12.5 mL/hr at 10/27/23 9247          Sophie Mao, MD [facility] 10/27/2023, 7:57 AM

## 2023-10-27 NOTE — Anesthesia Preprocedure Evaluation (Addendum)
 Anesthesia Evaluation  Patient identified by MRN, date of birth, ID band Patient awake    Reviewed: Allergy & Precautions, NPO status , Patient's Chart, lab work & pertinent test results  Airway Mallampati: III  TM Distance: >3 FB Neck ROM: Full    Dental  (+) Dental Advisory Given, Teeth Intact   Pulmonary Current Smoker and Patient abstained from smoking.   Pulmonary exam normal breath sounds clear to auscultation       Cardiovascular hypertension, Normal cardiovascular exam Rhythm:Regular Rate:Normal  Echo 05/2023  1. Left ventricular ejection fraction, by estimation, is 60 to 65%. The left ventricle has normal function. The left ventricle has no regional wall motion abnormalities. Left ventricular diastolic parameters are consistent with Grade I diastolic dysfunction (impaired relaxation).   2. Right ventricular systolic function is normal. The right ventricular size is normal. Tricuspid regurgitation signal is inadequate for assessing PA pressure.   3. The mitral valve is normal in structure. No evidence of mitral valve regurgitation. No evidence of mitral stenosis.   4. The aortic valve is normal in structure. Aortic valve regurgitation is not visualized. Aortic valve sclerosis is present, with no evidence of aortic valve stenosis.   5. The inferior vena cava is normal in size with greater than 50% respiratory variability, suggesting right atrial pressure of 3 mmHg.     Neuro/Psych  PSYCHIATRIC DISORDERS Anxiety Depression    negative neurological ROS     GI/Hepatic Neg liver ROS, PUD,GERD  Controlled and Medicated,,Diverticulitis    Endo/Other  Obesity BMI 32  Renal/GU Renal disease     Musculoskeletal negative musculoskeletal ROS (+)    Abdominal  (+) + obese  Peds  Hematology  (+) Blood dyscrasia, anemia Hb 11.4, plt 582   Anesthesia Other Findings   Reproductive/Obstetrics negative OB ROS                               Anesthesia Physical Anesthesia Plan  ASA: 2  Anesthesia Plan: General   Post-op Pain Management: Tylenol  PO (pre-op)*, Ketamine  IV* and Dilaudid  IV   Induction: Intravenous  PONV Risk Score and Plan: 4 or greater and Ondansetron , Dexamethasone , Midazolam  and Treatment may vary due to age or medical condition  Airway Management Planned: Oral ETT  Additional Equipment: None  Intra-op Plan:   Post-operative Plan: Extubation in OR  Informed Consent: I have reviewed the patients History and Physical, chart, labs and discussed the procedure including the risks, benefits and alternatives for the proposed anesthesia with the patient or authorized representative who has indicated his/her understanding and acceptance.     Dental advisory given  Plan Discussed with: CRNA  Anesthesia Plan Comments:          Anesthesia Quick Evaluation

## 2023-10-28 ENCOUNTER — Encounter (HOSPITAL_COMMUNITY): Payer: Self-pay | Admitting: Surgery

## 2023-10-28 DIAGNOSIS — K5792 Diverticulitis of intestine, part unspecified, without perforation or abscess without bleeding: Secondary | ICD-10-CM | POA: Diagnosis not present

## 2023-10-28 LAB — CBC WITH DIFFERENTIAL/PLATELET
Abs Immature Granulocytes: 0.07 K/uL (ref 0.00–0.07)
Basophils Absolute: 0 K/uL (ref 0.0–0.1)
Basophils Relative: 0 %
Eosinophils Absolute: 0 K/uL (ref 0.0–0.5)
Eosinophils Relative: 0 %
HCT: 32.8 % — ABNORMAL LOW (ref 36.0–46.0)
Hemoglobin: 10.6 g/dL — ABNORMAL LOW (ref 12.0–15.0)
Immature Granulocytes: 1 %
Lymphocytes Relative: 14 %
Lymphs Abs: 1.8 K/uL (ref 0.7–4.0)
MCH: 26 pg (ref 26.0–34.0)
MCHC: 32.3 g/dL (ref 30.0–36.0)
MCV: 80.4 fL (ref 80.0–100.0)
Monocytes Absolute: 1.2 K/uL — ABNORMAL HIGH (ref 0.1–1.0)
Monocytes Relative: 9 %
Neutro Abs: 10.2 K/uL — ABNORMAL HIGH (ref 1.7–7.7)
Neutrophils Relative %: 76 %
Platelets: 589 K/uL — ABNORMAL HIGH (ref 150–400)
RBC: 4.08 MIL/uL (ref 3.87–5.11)
RDW: 16.7 % — ABNORMAL HIGH (ref 11.5–15.5)
WBC: 13.3 K/uL — ABNORMAL HIGH (ref 4.0–10.5)
nRBC: 0 % (ref 0.0–0.2)

## 2023-10-28 LAB — BASIC METABOLIC PANEL WITH GFR
Anion gap: 11 (ref 5–15)
BUN: <5 mg/dL — ABNORMAL LOW (ref 6–20)
CO2: 25 mmol/L (ref 22–32)
Calcium: 8.7 mg/dL — ABNORMAL LOW (ref 8.9–10.3)
Chloride: 101 mmol/L (ref 98–111)
Creatinine, Ser: 0.94 mg/dL (ref 0.44–1.00)
GFR, Estimated: >60 mL/min (ref >60)
Glucose, Bld: 126 mg/dL — ABNORMAL HIGH (ref 70–99)
Potassium: 3.8 mmol/L (ref 3.5–5.1)
Sodium: 137 mmol/L (ref 135–145)

## 2023-10-28 LAB — MAGNESIUM: Magnesium: 1.7 mg/dL (ref 1.7–2.4)

## 2023-10-28 MED ORDER — PANTOPRAZOLE SODIUM 40 MG PO TBEC
40.0000 mg | DELAYED_RELEASE_TABLET | Freq: Two times a day (BID) | ORAL | Status: DC
  Administered 2023-10-28 – 2023-10-30 (×5): 40 mg via ORAL
  Filled 2023-10-28 (×4): qty 1

## 2023-10-28 MED ORDER — HYDROMORPHONE HCL 1 MG/ML IJ SOLN
2.0000 mg | INTRAMUSCULAR | Status: DC | PRN
  Administered 2023-10-28 – 2023-10-29 (×6): 2 mg via INTRAVENOUS
  Filled 2023-10-28 (×5): qty 2

## 2023-10-28 MED ORDER — CHLORHEXIDINE GLUCONATE CLOTH 2 % EX PADS
6.0000 | MEDICATED_PAD | Freq: Every day | CUTANEOUS | Status: DC
  Administered 2023-10-28 – 2023-10-29 (×3): 6 via TOPICAL

## 2023-10-28 MED ORDER — OXYCODONE HCL 5 MG PO TABS
10.0000 mg | ORAL_TABLET | ORAL | Status: DC | PRN
  Administered 2023-10-28 – 2023-10-29 (×5): 10 mg via ORAL
  Filled 2023-10-28 (×3): qty 2

## 2023-10-28 NOTE — Evaluation (Signed)
 Physical Therapy Evaluation Patient Details Name: Natalie Edwards MRN: [id number] DOB: 05/25/1965 Today's Date: 10/28/2023  History of Present Illness  Pt is a 58 y.o. F who presents 10/22/2023 with worsening abdominal pain. CT scan of the abdomen and pelvis on 10/21/2023 showed diverticulosis with acute sigmoid diverticulitis with start empirically on antibiotics and IV fluids. S/p laparoscopy with sigmoid colectomy on 8/12. PMH: HTN, GERD, Anxiety, depression, hx of SI with suicide attempts, hx of gastric ulcers, insomnia, leukocytosis.  Clinical Impression  PTA, pt lives alone in an apartment with 3 flights to enter. Pt reports supportive children/family members are able to assist as needed. Pt currently presents with lower abdominal pain, thus limiting mobility. Pt ambulating household distance ~120 ft with a Rollator and supervision, with slow pace. Pt also able to additionally ambulate bedroom distance with no assistive device without discernable difference in stability or speed, so discussed will likely not need AD upon d/c home (pt reports Rollator would not fit). Will continue to follow acutely for stair training. Recommend continued OPPT at d/c for shoulder.        If plan is discharge home, recommend the following: Assistance with cooking/housework;Assist for transportation;Help with stairs or ramp for entrance   Can travel by private vehicle        Equipment Recommendations None recommended by PT  Recommendations for Other Services       Functional Status Assessment Patient has had a recent decline in their functional status and demonstrates the ability to make significant improvements in function in a reasonable and predictable amount of time.     Precautions / Restrictions Precautions Precautions: Fall Restrictions Weight Bearing Restrictions Per Provider Order: No      Mobility  Bed Mobility Overal bed mobility: Needs Assistance Bed Mobility: Sit to Supine       Sit to  supine: Min assist   General bed mobility comments: MinA for LE assist back into bed    Transfers Overall transfer level: Needs assistance Equipment used: None Transfers: Sit to/from Stand Sit to Stand: Supervision                Ambulation/Gait Ambulation/Gait assistance: Supervision Gait Distance (Feet): 120 Feet Assistive device: Rollator (4 wheels) Gait Pattern/deviations: Step-through pattern, Decreased stride length Gait velocity: decreased     General Gait Details: Lightly utilizing Rollator, slow pace, pt self cueing for posture  Stairs            Wheelchair Mobility     Tilt Bed    Modified Rankin (Stroke Patients Only)       Balance Overall balance assessment: Needs assistance Sitting-balance support: No upper extremity supported, Feet supported Sitting balance-Leahy Scale: Fair     Standing balance support: During functional activity, No upper extremity supported Standing balance-Leahy Scale: Fair                               Pertinent Vitals/Pain Pain Assessment Pain Assessment: Faces Faces Pain Scale: Hurts even more Pain Location: lower abdomen Pain Descriptors / Indicators: Grimacing, Guarding Pain Intervention(s): Limited activity within patient's tolerance, Monitored during session    Home Living Family/patient expects to be discharged to:: Private residence Living Arrangements: Alone Available Help at Discharge: Family;Available PRN/intermittently Type of Home: Apartment Home Access: Stairs to enter Entrance Stairs-Rails: Can reach both Entrance Stairs-Number of Steps: 3 flights   Home Layout: One level Home Equipment: None Additional Comments: Pt lives alone, hoping that  son can stay with her for a couple of weeks    Prior Function Prior Level of Function : Independent/Modified Independent             Mobility Comments: no AD, fall in Feb 2025, hurt L shoulder ADLs Comments: ind      Extremity/Trunk Assessment   Upper Extremity Assessment Upper Extremity Assessment: Defer to OT evaluation LUE Deficits / Details: shoudler injury, difficulty with shoudler flexion/ABD, uses RUE for AAROM. In OPOT for shoulder, has been ind with ADLs LUE: Shoulder pain with ROM LUE Sensation: WNL    Lower Extremity Assessment Lower Extremity Assessment: Overall WFL for tasks assessed    Cervical / Trunk Assessment Cervical / Trunk Assessment: Normal  Communication   Communication Communication: No apparent difficulties    Cognition Arousal: Alert Behavior During Therapy: Flat affect   PT - Cognitive impairments: No apparent impairments                         Following commands: Intact       Cueing Cueing Techniques: Verbal cues     General Comments      Exercises     Assessment/Plan    PT Assessment Patient needs continued PT services  PT Problem List Decreased activity tolerance;Decreased mobility;Pain       PT Treatment Interventions Gait training;Functional mobility training;Stair training;Therapeutic activities;Therapeutic exercise;Balance training;Patient/family education    PT Goals (Current goals can be found in the Care Plan section)  Acute Rehab PT Goals Patient Stated Goal: to return home PT Goal Formulation: With patient Time For Goal Achievement: 11/11/23 Potential to Achieve Goals: Good    Frequency Min 2X/week     Co-evaluation               AM-PAC PT 6 Clicks Mobility  Outcome Measure Help needed turning from your back to your side while in a flat bed without using bedrails?: None Help needed moving from lying on your back to sitting on the side of a flat bed without using bedrails?: A Little Help needed moving to and from a bed to a chair (including a wheelchair)?: A Little Help needed standing up from a chair using your arms (e.g., wheelchair or bedside chair)?: A Little Help needed to walk in hospital room?: A  Little Help needed climbing 3-5 steps with a railing? : A Little 6 Click Score: 19    End of Session   Activity Tolerance: Patient tolerated treatment well Patient left: in bed;with call bell/phone within reach Nurse Communication: Mobility status PT Visit Diagnosis: Difficulty in walking, not elsewhere classified (R26.2);Pain Pain - part of body:  (abdomen)    Time: 8652-8583 PT Time Calculation (min) (ACUTE ONLY): 29 min   Charges:   PT Evaluation $PT Eval Low Complexity: 1 Low PT Treatments $Therapeutic Activity: 8-22 mins PT General Charges $$ ACUTE PT VISIT: 1 Visit         Aleck Daring, PT, DPT Acute Rehabilitation Services Office [phone]   Alayne ONEIDA Daring 10/28/2023, 3:31 PM

## 2023-10-28 NOTE — Progress Notes (Signed)
 Progress Note: General Surgery Service   Chief Complaint/Subjective: Doing okay POD1.  No flatus or bowel movements  Objective: Vital signs in last 24 hours: Temp:  [97.6 F (36.4 C)-98.8 F (37.1 C)] 98.5 F (36.9 C) (08/13 0754) Pulse Rate:  [71-97] 76 (08/13 0754) Resp:  [11-19] 18 (08/13 0754) BP: (110-163)/(59-84) 111/59 (08/13 0754) SpO2:  [89 %-100 %] 98 % (08/13 0754) Weight:  [85.5 kg] 85.5 kg (08/12 1227) Last BM Date : 10/26/23  Intake/Output from previous day: 08/12 0701 - 08/13 0700 In: 2518.5 [P.O.:100; I.V.:1764.8; IV Piggyback:653.7] Out: 2095 [Urine:1745; Emesis/NG output:150; Blood:200] Intake/Output this shift: No intake/output data recorded.  Constitutional: NAD; conversant; no deformities Eyes: Moist conjunctiva; no lid lag; anicteric; PERRL Neck: Trachea midline; no thyromegaly Lungs: Normal respiratory effort; no tactile fremitus CV: RRR; no palpable thrills; no pitting edema GI: Abd Incisions c/d/I w/ staples; no palpable hepatosplenomegaly MSK: Normal range of motion of extremities; no clubbing/cyanosis Psychiatric: Appropriate affect; alert and oriented x3 Lymphatic: No palpable cervical or axillary lymphadenopathy  Lab Results: CBC  Recent Labs    10/27/23 0454 10/28/23 0456  WBC 14.8* 13.3*  HGB 11.4* 10.6*  HCT 35.1* 32.8*  PLT 582* 589*   BMET Recent Labs    10/27/23 0454 10/28/23 0456  NA 138 137  K 3.5 3.8  CL 100 101  CO2 24 25  GLUCOSE 138* 126*  BUN 6 <5*  CREATININE 1.07* 0.94  CALCIUM  9.2 8.7*   PT/INR No results for input(s): LABPROT, INR in the last 72 hours. ABG No results for input(s): PHART, HCO3 in the last 72 hours.  Invalid input(s): PCO2, PO2  Anti-infectives: Anti-infectives (From admission, onward)    Start     Dose/Rate Route Frequency Ordered Stop   10/27/23 0930  fluconazole  (DIFLUCAN ) tablet 150 mg        150 mg Oral  Once 10/27/23 0832 10/27/23 1015   10/26/23 2200  neomycin   (MYCIFRADIN ) tablet 1,000 mg  Status:  Discontinued       Placed in And Linked Group   1,000 mg Oral 3 times per day 10/26/23 1905 10/27/23 1811   10/26/23 2200  metroNIDAZOLE  (FLAGYL ) tablet 1,000 mg  Status:  Discontinued       Placed in And Linked Group   1,000 mg Oral 3 times per day 10/26/23 1905 10/27/23 1811   10/23/23 0000  piperacillin -tazobactam (ZOSYN ) IVPB 3.375 g       Placed in Followed by Linked Group   3.375 g 12.5 mL/hr over 240 Minutes Intravenous Every 8 hours 10/22/23 1727     10/22/23 1815  piperacillin -tazobactam (ZOSYN ) IVPB 3.375 g       Placed in Followed by Linked Group   3.375 g 100 mL/hr over 30 Minutes Intravenous  Once 10/22/23 1727 10/22/23 1853   10/22/23 1030  ciprofloxacin  (CIPRO ) IVPB 400 mg        400 mg 200 mL/hr over 60 Minutes Intravenous  Once 10/22/23 1028 10/22/23 1241   10/22/23 1030  metroNIDAZOLE  (FLAGYL ) IVPB 500 mg        500 mg 100 mL/hr over 60 Minutes Intravenous  Once 10/22/23 1028 10/22/23 1315       Medications: Scheduled Meds:  busPIRone   10 mg Oral BID   Chlorhexidine  Gluconate Cloth  6 each Topical Daily   FLUoxetine   20 mg Oral Daily   folic acid   1 mg Oral Daily   heparin   5,000 Units Subcutaneous Q12H   multivitamin with minerals  1  tablet Oral Daily   nicotine   21 mg Transdermal Daily   pantoprazole  (PROTONIX ) IV  40 mg Intravenous Q12H   sodium chloride  flush  3 mL Intravenous Q12H   thiamine   100 mg Oral Daily   Or   thiamine   100 mg Intravenous Daily   Continuous Infusions:  lactated ringers  50 mL/hr at 10/27/23 1819   piperacillin -tazobactam (ZOSYN )  IV 3.375 g (10/28/23 0800)   PRN Meds:.acetaminophen  **OR** acetaminophen , HYDROmorphone  (DILAUDID ) injection, LORazepam , ondansetron  (ZOFRAN ) IV, temazepam   Assessment/Plan: s/p Procedure(s): COLECTOMY, SIGMOID, LAPAROSCOPIC MOBILIZATION, SPLENIC FLEXURE 10/27/2023  Natalie Edwards is POD1 from laparoscopic sigmoid colectomy for smoldering  diverticulitis  Diet as tolerated Remove foley Increase activity Awaiting return of bowel function Pain control.   LOS: 6 days    Natalie JINNY Foy, MD  [facility] Use [email or web address] to contact on call provider  Daily Billing: 00975 - post op

## 2023-10-28 NOTE — Plan of Care (Signed)
   Problem: Education: Goal: Knowledge of General Education information will improve Description: Including pain rating scale, medication(s)/side effects and non-pharmacologic comfort measures Outcome: Progressing   Problem: Clinical Measurements: Goal: Respiratory complications will improve Outcome: Progressing

## 2023-10-28 NOTE — Anesthesia Postprocedure Evaluation (Signed)
 Anesthesia Post Note  Patient: Corporate treasurer  Procedure(s) Performed: COLECTOMY, SIGMOID, LAPAROSCOPIC (Abdomen) MOBILIZATION, SPLENIC FLEXURE (Abdomen)     Patient location during evaluation: PACU Anesthesia Type: General Level of consciousness: awake and alert Pain management: pain level controlled Vital Signs Assessment: post-procedure vital signs reviewed and stable Respiratory status: spontaneous breathing, nonlabored ventilation and respiratory function stable Cardiovascular status: stable and blood pressure returned to baseline Anesthetic complications: no   No notable events documented.  Last Vitals:  Vitals:   10/28/23 0548 10/28/23 0754  BP: (!) 110/59 (!) 111/59  Pulse: 80 76  Resp: 18 18  Temp: 37.1 C 36.9 C  SpO2: 97% 98%    Last Pain:  Vitals:   10/28/23 0758  TempSrc:   PainSc: 10-Worst pain ever                 Debby FORBES Like

## 2023-10-28 NOTE — Evaluation (Signed)
 Physical Therapy Evaluation Patient Details Name: Natalie Edwards MRN: [id number] DOB: [birth year] Today's Date: 10/28/2023  History of Present Illness  Pt is a 58 y.o. F who presents 10/22/2023 with worsening abdominal pain. CT scan of the abdomen and pelvis on 10/21/2023 showed diverticulosis with acute sigmoid diverticulitis with start empirically on antibiotics and IV fluids. S/p laparoscopy with sigmoid colectomy on 8/12. PMH: HTN, GERD, Anxiety, depression, hx of SI with suicide attempts, hx of gastric ulcers, insomnia, leukocytosis.  Clinical Impression  PTA, pt lives alone in an apartment with 3 flights to enter. Pt reports supportive children/family members are able to assist as needed. Pt currently presents with lower abdominal pain, thus limiting mobility. Pt ambulating household distance ~120 ft with a Rollator and supervision, with slow pace. Pt also able to additionally ambulate bedroom distance with no assistive device without discernable difference in stability or speed, so discussed will likely not need AD upon d/c home (pt reports Rollator would not fit). Will continue to follow acutely for stair training. Recommend continued OPPT at d/c for shoulder.        If plan is discharge home, recommend the following: Assistance with cooking/housework;Assist for transportation;Help with stairs or ramp for entrance   Can travel by private vehicle        Equipment Recommendations None recommended by PT  Recommendations for Other Services       Functional Status Assessment Patient has had a recent decline in their functional status and demonstrates the ability to make significant improvements in function in a reasonable and predictable amount of time.     Precautions / Restrictions Precautions Precautions: Fall Restrictions Weight Bearing Restrictions Per Provider Order: No      Mobility  Bed Mobility Overal bed mobility: Needs Assistance Bed Mobility: Sit to Supine       Sit to  supine: Min assist   General bed mobility comments: MinA for LE assist back into bed    Transfers Overall transfer level: Needs assistance Equipment used: None Transfers: Sit to/from Stand Sit to Stand: Supervision                Ambulation/Gait Ambulation/Gait assistance: Supervision Gait Distance (Feet): 120 Feet Assistive device: Rollator (4 wheels) Gait Pattern/deviations: Step-through pattern, Decreased stride length Gait velocity: decreased     General Gait Details: Lightly utilizing Rollator, slow pace, pt self cueing for posture  Stairs            Wheelchair Mobility     Tilt Bed    Modified Rankin (Stroke Patients Only)       Balance Overall balance assessment: Needs assistance Sitting-balance support: No upper extremity supported, Feet supported Sitting balance-Leahy Scale: Fair     Standing balance support: During functional activity, No upper extremity supported Standing balance-Leahy Scale: Fair                               Pertinent Vitals/Pain Pain Assessment Pain Assessment: Faces Faces Pain Scale: Hurts even more Pain Location: lower abdomen Pain Descriptors / Indicators: Grimacing, Guarding Pain Intervention(s): Limited activity within patient's tolerance, Monitored during session    Home Living Family/patient expects to be discharged to:: Private residence Living Arrangements: Alone Available Help at Discharge: Family;Available PRN/intermittently Type of Home: Apartment Home Access: Stairs to enter Entrance Stairs-Rails: Can reach both Entrance Stairs-Number of Steps: 3 flights   Home Layout: One level Home Equipment: None Additional Comments: Pt lives alone, hoping that  son can stay with her for a couple of weeks    Prior Function Prior Level of Function : Independent/Modified Independent             Mobility Comments: no AD, fall in Feb 2025, hurt L shoulder ADLs Comments: ind      Extremity/Trunk Assessment   Upper Extremity Assessment Upper Extremity Assessment: Defer to OT evaluation LUE Deficits / Details: shoudler injury, difficulty with shoudler flexion/ABD, uses RUE for AAROM. In OPOT for shoulder, has been ind with ADLs LUE: Shoulder pain with ROM LUE Sensation: WNL    Lower Extremity Assessment Lower Extremity Assessment: Overall WFL for tasks assessed    Cervical / Trunk Assessment Cervical / Trunk Assessment: Normal  Communication   Communication Communication: No apparent difficulties    Cognition Arousal: Alert Behavior During Therapy: Flat affect   PT - Cognitive impairments: No apparent impairments                         Following commands: Intact       Cueing Cueing Techniques: Verbal cues     General Comments      Exercises     Assessment/Plan    PT Assessment Patient needs continued PT services  PT Problem List Decreased activity tolerance;Decreased mobility;Pain       PT Treatment Interventions Gait training;Functional mobility training;Stair training;Therapeutic activities;Therapeutic exercise;Balance training;Patient/family education    PT Goals (Current goals can be found in the Care Plan section)  Acute Rehab PT Goals Patient Stated Goal: to return home PT Goal Formulation: With patient Time For Goal Achievement: 11/11/23 Potential to Achieve Goals: Good    Frequency Min 2X/week     Co-evaluation               AM-PAC PT 6 Clicks Mobility  Outcome Measure Help needed turning from your back to your side while in a flat bed without using bedrails?: None Help needed moving from lying on your back to sitting on the side of a flat bed without using bedrails?: A Little Help needed moving to and from a bed to a chair (including a wheelchair)?: A Little Help needed standing up from a chair using your arms (e.g., wheelchair or bedside chair)?: A Little Help needed to walk in hospital room?: A  Little Help needed climbing 3-5 steps with a railing? : A Little 6 Click Score: 19    End of Session   Activity Tolerance: Patient tolerated treatment well Patient left: in bed;with call bell/phone within reach Nurse Communication: Mobility status PT Visit Diagnosis: Difficulty in walking, not elsewhere classified (R26.2);Pain Pain - part of body:  (abdomen)    Time: 8652-8583 PT Time Calculation (min) (ACUTE ONLY): 29 min   Charges:   PT Evaluation $PT Eval Low Complexity: 1 Low PT Treatments $Therapeutic Activity: 8-22 mins PT General Charges $$ ACUTE PT VISIT: 1 Visit         Aleck Daring, PT, DPT Acute Rehabilitation Services Office [phone]   Alayne ONEIDA Daring 10/28/2023, 3:31 PM

## 2023-10-28 NOTE — TOC CM/SW Note (Signed)
 Transition of Care [facility]) - Inpatient Brief Assessment   Patient Details  Name: Natalie Edwards MRN: [id number] Date of Birth: 1965-03-28  Transition of Care [facility]) CM/SW Contact:    Tom-Johnson, Harvest Muskrat, RN Phone Number: 10/28/2023, 9:15 AM   Clinical Narrative:  Patient presented to the ED from her PCP with worsening abdominal pain, N/V. Patient was recently at [facility] ED with same complaint. CT Abdomen/Pelvis showed Diverticulosis with Sigmoid Colon, uncomplicated acute Diverticulitis. General Sx and GI consulted, patient underwent Laparoscopic Sigmoid Colectomy yesterday 10/27/23 by General Sx. Continues on IV abx and pain management.   From home alone, has three supportive children. Not employed, on disability. Does not drive, uses Medicaid transportation to and from appointments. Does not have DME's at home.  PCP is Tanda Bleacher, MD and uses [facility] on [address]     No TOC recommendations noted at this time.  Patient not Medically ready for discharge.  CM will continue to follow as patient progresses with care towards discharge.             Transition of Care Asessment: Insurance and Status: Insurance coverage has been reviewed Patient has primary care physician: Yes Home environment has been reviewed: Yes Prior level of function:: Independent Prior/Current Home Services: No current home services Social Drivers of Health Review: SDOH reviewed no interventions necessary Readmission risk has been reviewed: Yes Transition of care needs: no transition of care needs at this time

## 2023-10-28 NOTE — Progress Notes (Signed)
 Foley removed.

## 2023-10-28 NOTE — Progress Notes (Signed)
 [facility] PROGRESS NOTE    Progress Note  Natalie Edwards  FMW:[id number] DOB: [birth year] DOA: 10/22/2023 PCP: Tanda Bleacher, MD     Brief Narrative:   Natalie Edwards is an 58 y.o. female past medical history of diverticular disease with recurrent diverticulitis, renal mass, polysubstance abuse including cocaine and alcohol major depressive disorder multiple suicide attempts comes in with worsening abdominal pain CT scan of the abdomen and pelvis on 10/21/2023 showed diverticulosis with acute sigmoid diverticulitis with start empirically on antibiotics and IV fluids General Surgery was also consulted, GI was consulted which they have signed off.  Assessment/Plan:   Recurrent acute diverticulitis General Surgery was consulted and recommended bowel prep with surgical intervention. She is status post laparoscopy with sigmoid colectomy on 10/27/2023.  Elective surgical intervention as she was tired of having pain and recurrent issues with her colon. Has not had a bowel movement.  Polysubstance abuse: UDS was positive for benzos and opiates. No signs of withdrawal continue CIWA protocol thiamine  folate.  Major depressive disorder with history of suicidal ideation: Psych consulted on 10/23/2023. They recommended to continue BuSpar  and fluoxetine . Ativan  as needed psych has signed off.  Tobacco abuse: Counseling.  GERD: Continue PPI.  Hypomagnesemia: Repleted and improved.  Anemia of chronic renal disease: Hemoglobin stable.  Thrombocytosis: Likely reactive still elevated. Continue to monitor.  Elevated LFTs: Likely due to infection.  Obesity: Noted.    DVT prophylaxis: lovenox  Family Communication:none Status is: Inpatient Remains inpatient appropriate because: Acute recurrent diverticulitis    Code Status:     Code Status Orders  (From admission, onward)           Start     Ordered   10/22/23 1339  Full code  Continuous       Question:  By:  Answer:   Other   10/22/23 1341           Code Status History     Date Active Date Inactive Code Status Order ID Comments User Context   10/07/2023 2103 10/08/2023 1719 Full Code [id number]  Rosan Sherlean VEAR DEVONNA ED   05/26/2023 1613 06/12/2023 0114 Full Code [id number]  Wilkie Majel RAMAN, FNP Inpatient   05/23/2023 0057 05/26/2023 1612 Full Code [id number]  Debby Camila LABOR, MD ED   01/18/2023 1505 01/22/2023 2128 Limited: Do not attempt resuscitation (DNR) -DNR-LIMITED -Do Not Intubate/DNI  [id number]  Waddell Rake, MD ED   01/18/2023 1455 01/18/2023 1505 Limited: Do not attempt resuscitation (DNR) -DNR-LIMITED -Do Not Intubate/DNI  [id number]  Waddell Rake, MD ED   12/23/2022 0144 12/27/2022 1917 Full Code [id number]  Melvenia Madelene HERO, NP Inpatient   10/17/2022 2251 10/18/2022 1501 Full Code [id number]  Hugelmeyer, Alexis, DO ED   09/28/2022 1224 09/30/2022 2023 Full Code [id number]  Barbarann Nest, MD ED         IV Access:   Peripheral IV   Procedures and diagnostic studies:   No results found.   Medical Consultants:   None.   Subjective:    Natalie Edwards she relates her pain is controlled with the narcotics are not lasting long enough  Objective:    Vitals:   10/27/23 1946 10/27/23 2126 10/28/23 0548 10/28/23 0754  BP: (!) 157/84  (!) 110/59 (!) 111/59  Pulse: 80  80 76  Resp: 19  18 18   Temp:  98.4 F (36.9 C) 98.8 F (37.1 C) 98.5 F (36.9 C)  TempSrc:  Oral  Oral  SpO2: 96%  97% 98%  Weight:  Height:       SpO2: 98 % O2 Flow Rate (L/min): 2 L/min   Intake/Output Summary (Last 24 hours) at 10/28/2023 0928 Last data filed at 10/28/2023 0900 Gross per 24 hour  Intake 2494.31 ml  Output 1945 ml  Net 549.31 ml   Filed Weights   10/25/23 0500 10/26/23 0426 10/27/23 1227  Weight: 88.3 kg 85.5 kg 85.5 kg    Exam: General exam: In no acute distress. Respiratory system: Good air movement and clear to auscultation. Cardiovascular system: S1 & S2 heard,  RRR. No JVD. Gastrointestinal system: Abdomen is nondistended, soft and nontender.  Extremities: No pedal edema. Skin: No rashes, lesions or ulcers Psychiatry: Judgement and insight appear normal. Mood & affect appropriate.    Data Reviewed:    Labs: Basic Metabolic Panel: Recent Labs  Lab 10/23/23 0610 10/24/23 0443 10/25/23 0723 10/27/23 0454 10/28/23 0456  NA 142 137 138 138 137  K 3.9 3.7 3.6 3.5 3.8  CL 104 102 102 100 101  CO2 23 22 21* 24 25  GLUCOSE 83 74 59* 138* 126*  BUN 9 8 9 6  <5*  CREATININE 1.08* 1.17* 1.10* 1.07* 0.94  CALCIUM  9.1 9.2 9.1 9.2 8.7*  MG  --  1.8 1.6* 1.7 1.7   GFR Estimated Creatinine Clearance: 68.2 mL/min (by C-G formula based on SCr of 0.94 mg/dL). Liver Function Tests: Recent Labs  Lab 10/22/23 1041 10/23/23 0610 10/24/23 0443 10/25/23 0723  AST 31 50* 52* 30  ALT 33 44 51* 39  ALKPHOS 73 67 67 63  BILITOT 0.2 0.5 0.8 0.8  PROT 7.6 6.8 6.7 7.0  ALBUMIN  3.6 3.2* 3.2* 3.1*   Recent Labs  Lab 10/22/23 1041  LIPASE 29   No results for input(s): AMMONIA in the last 168 hours. Coagulation profile No results for input(s): INR, PROTIME in the last 168 hours. COVID-19 Labs  No results for input(s): DDIMER, FERRITIN, LDH, CRP in the last 72 hours.  Lab Results  Component Value Date   SARSCOV2NAA NEGATIVE 12/22/2022   SARSCOV2NAA NEGATIVE 09/28/2022   SARSCOV2NAA NEGATIVE 08/28/2022   SARSCOV2NAA POSITIVE (A) 04/29/2022    CBC: Recent Labs  Lab 10/22/23 1041 10/23/23 0610 10/24/23 0443 10/25/23 0723 10/27/23 0454 10/28/23 0456  WBC 13.5* 11.9* 13.0* 14.0* 14.8* 13.3*  NEUTROABS 10.3*  --  8.4* 8.6* 9.8* 10.2*  HGB 11.5* 10.7* 11.0* 11.3* 11.4* 10.6*  HCT 35.8* 33.0* 33.5* 34.0* 35.1* 32.8*  MCV 81.2 81.5 79.6* 80.4 80.5 80.4  PLT 551* 502* 575* 578* 582* 589*   Cardiac Enzymes: No results for input(s): CKTOTAL, CKMB, CKMBINDEX, TROPONINI in the last 168 hours. BNP (last 3 results) No  results for input(s): PROBNP in the last 8760 hours. CBG: No results for input(s): GLUCAP in the last 168 hours. D-Dimer: No results for input(s): DDIMER in the last 72 hours. Hgb A1c: No results for input(s): HGBA1C in the last 72 hours. Lipid Profile: No results for input(s): CHOL, HDL, LDLCALC, TRIG, CHOLHDL, LDLDIRECT in the last 72 hours. Thyroid  function studies: No results for input(s): TSH, T4TOTAL, T3FREE, THYROIDAB in the last 72 hours.  Invalid input(s): FREET3 Anemia work up: No results for input(s): VITAMINB12, FOLATE, FERRITIN, TIBC, IRON , RETICCTPCT in the last 72 hours. Sepsis Labs: Recent Labs  Lab 10/22/23 1249 10/23/23 0610 10/24/23 0443 10/25/23 0723 10/27/23 0454 10/28/23 0456  WBC  --    < > 13.0* 14.0* 14.8* 13.3*  LATICACIDVEN 0.7  --   --   --   --   --    < > =  values in this interval not displayed.   Microbiology Recent Results (from the past 240 hours)  Surgical pcr screen     Status: None   Collection Time: 10/27/23  3:32 AM   Specimen: Nasal Mucosa; Nasal Swab  Result Value Ref Range Status   MRSA, PCR NEGATIVE NEGATIVE Final   Staphylococcus aureus NEGATIVE NEGATIVE Final    Comment: (NOTE) The Xpert SA Assay (FDA approved for NASAL specimens in patients 62 years of age and older), is one component of a comprehensive surveillance program. It is not intended to diagnose infection nor to guide or monitor treatment. Performed at [facility], [address] [place], [place] [address]      Medications:    busPIRone   10 mg Oral BID   Chlorhexidine  Gluconate Cloth  6 each Topical Daily   FLUoxetine   20 mg Oral Daily   folic acid   1 mg Oral Daily   heparin   5,000 Units Subcutaneous Q12H   multivitamin with minerals  1 tablet Oral Daily   nicotine   21 mg Transdermal Daily   pantoprazole  (PROTONIX ) IV  40 mg Intravenous Q12H   sodium chloride  flush  3 mL Intravenous Q12H   thiamine   100 mg  Oral Daily   Or   thiamine   100 mg Intravenous Daily   Continuous Infusions:  lactated ringers  50 mL/hr at 10/27/23 1819   piperacillin -tazobactam (ZOSYN )  IV 3.375 g (10/28/23 0800)      LOS: 6 days   Natalie Edwards  [facility]  10/28/2023, 9:28 AM

## 2023-10-28 NOTE — Evaluation (Signed)
 Occupational Therapy Evaluation Patient Details Name: Natalie Edwards MRN: [id number] DOB: [birth year] Today's Date: 10/28/2023   History of Present Illness   Pt is a 58 y.o. F who presents 10/22/2023 with worsening abdominal pain. CT scan of the abdomen and pelvis on 10/21/2023 showed diverticulosis with acute sigmoid diverticulitis with start empirically on antibiotics and IV fluids. S/p laparoscopy with sigmoid colectomy on 8/12. PMH: HTN, GERD, Anxiety, depression, hx of SI with suicide attempts, hx of gastric ulcers, insomnia, leukocytosis.     Clinical Impressions Pt c/o pain to abdomen, increased with activities. Pt live alone, states son can stay with her for a couple weeks if needed, PLOF independent, 3 flights STE. Pt currently limited due to pain in abdomen, min A for bed mobility, mod A for LB ADLs due to not being able to lean forward or pull legs up without pain, able to stand/ambulated with/without AD short distances. Pt expected to quickly improve once pain is managed. Pt reports fall earlier this year with L shoulder injury, has been going to OP therapy for shoulder, would benefit from continued OP therapy.  Will continue to see Pt acutely to progress as able, no DME needs expected.      If plan is discharge home, recommend the following:   Assistance with cooking/housework;Assist for transportation;Help with stairs or ramp for entrance     Functional Status Assessment   Patient has had a recent decline in their functional status and demonstrates the ability to make significant improvements in function in a reasonable and predictable amount of time.     Equipment Recommendations   None recommended by OT     Recommendations for Other Services         Precautions/Restrictions   Precautions Precautions: Fall Restrictions Weight Bearing Restrictions Per Provider Order: No     Mobility Bed Mobility Overal bed mobility: Needs Assistance Bed Mobility: Supine to  Sit, Sit to Supine     Supine to sit: Min assist Sit to supine: Min assist   General bed mobility comments: min A in/out of bed    Transfers Overall transfer level: Needs assistance Equipment used: 1 person hand held assist Transfers: Sit to/from Stand Sit to Stand: Contact guard assist           General transfer comment: CGA for STS and side steps at bedside      Balance Overall balance assessment: Needs assistance Sitting-balance support: No upper extremity supported, Feet supported Sitting balance-Leahy Scale: Fair Sitting balance - Comments: fair static sitting, not able to lean well due to pain in abdomen   Standing balance support: Single extremity supported, During functional activity Standing balance-Leahy Scale: Fair Standing balance comment: able to static stand unsuppoted, 1 HHA when taking steps                           ADL either performed or assessed with clinical judgement   ADL Overall ADL's : Needs assistance/impaired Eating/Feeding: Independent   Grooming: Supervision/safety;Standing               Lower Body Dressing: Moderate assistance;Sitting/lateral leans;Sit to/from stand               Functional mobility during ADLs: Supervision/safety General ADL Comments: Pt overall set up/supervision, mod a for LB ADLs due to pain in abdomen, expected to improve once pain is managed     Vision Baseline Vision/History: 0 No visual deficits Ability to See in Adequate Light:  0 Adequate Patient Visual Report: No change from baseline       Perception         Praxis         Pertinent Vitals/Pain Pain Assessment Pain Assessment: 0-10 (just got pain meds) Faces Pain Scale: Hurts even more Pain Location: lower abdomen Pain Descriptors / Indicators: Aching, Discomfort, Grimacing Pain Intervention(s): Monitored during session     Extremity/Trunk Assessment Upper Extremity Assessment Upper Extremity Assessment: Defer to OT  evaluation LUE Deficits / Details: shoudler injury, difficulty with shoudler flexion/ABD, uses RUE for AAROM. In OPOT for shoulder, has been ind with ADLs LUE: Shoulder pain with ROM LUE Sensation: WNL   Lower Extremity Assessment Lower Extremity Assessment: Overall WFL for tasks assessed   Cervical / Trunk Assessment Cervical / Trunk Assessment: Normal   Communication Communication Communication: No apparent difficulties   Cognition Arousal: Alert Behavior During Therapy: WFL for tasks assessed/performed Cognition: No apparent impairments                               Following commands: Intact       Cueing  General Comments   Cueing Techniques: Verbal cues      Exercises     Shoulder Instructions      Home Living Family/patient expects to be discharged to:: Private residence Living Arrangements: Alone Available Help at Discharge: Family;Available PRN/intermittently Type of Home: Apartment Home Access: Stairs to enter [employer] of Steps: 3 flights Entrance Stairs-Rails: Can reach both Home Layout: One level     Bathroom Shower/Tub: Tub/shower unit         Home Equipment: None   Additional Comments: Pt lives alone, hoping that son can stay with her for a couple of weeks      Prior Functioning/Environment Prior Level of Function : Independent/Modified Independent             Mobility Comments: no AD, fall in Feb 2025, hurt L shoulder ADLs Comments: ind    OT Problem List: Decreased strength;Decreased activity tolerance;Decreased range of motion;Pain;Obesity   OT Treatment/Interventions: Self-care/ADL training;Therapeutic exercise;Energy conservation;DME and/or AE instruction;Therapeutic activities;Patient/family education;Balance training      OT Goals(Current goals can be found in the care plan section)   Acute Rehab OT Goals Patient Stated Goal: to manage pain OT Goal Formulation: With patient Time For Goal  Achievement: 11/11/23 Potential to Achieve Goals: Good   OT Frequency:  Min 2X/week    Co-evaluation              AM-PAC OT 6 Clicks Daily Activity     Outcome Measure Help from another person eating meals?: None Help from another person taking care of personal grooming?: A Little Help from another person toileting, which includes using toliet, bedpan, or urinal?: A Little Help from another person bathing (including washing, rinsing, drying)?: A Little Help from another person to put on and taking off regular upper body clothing?: A Little Help from another person to put on and taking off regular lower body clothing?: A Lot 6 Click Score: 18   End of Session Equipment Utilized During Treatment: Gait belt Nurse Communication: Mobility status  Activity Tolerance: Patient tolerated treatment well Patient left: in bed;with call bell/phone within reach  OT Visit Diagnosis: Other abnormalities of gait and mobility (R26.89);Pain;Muscle weakness (generalized) (M62.81) Pain - part of body:  (abdomen)  Time: 8574-8542 OT Time Calculation (min): 32 min Charges:  OT General Charges $OT Visit: 1 Visit OT Evaluation $OT Eval Low Complexity: 1 Low OT Treatments $Self Care/Home Management : 8-22 mins  Alianza, OTR/L   Elouise JONELLE Bott 10/28/2023, 3:11 PM

## 2023-10-28 NOTE — Progress Notes (Signed)
 Dr. Odell notified that patient has not voided since 0800 foley removal. Bladder scan volume shows 0ml. Will continue to encourage PO fluids and continue to monitor.

## 2023-10-29 DIAGNOSIS — K5792 Diverticulitis of intestine, part unspecified, without perforation or abscess without bleeding: Secondary | ICD-10-CM | POA: Diagnosis not present

## 2023-10-29 MED ORDER — METHOCARBAMOL 500 MG PO TABS
750.0000 mg | ORAL_TABLET | Freq: Three times a day (TID) | ORAL | Status: DC
  Administered 2023-10-29 – 2023-10-30 (×3): 750 mg via ORAL
  Filled 2023-10-29 (×4): qty 2

## 2023-10-29 MED ORDER — OXYCODONE HCL 5 MG PO TABS
5.0000 mg | ORAL_TABLET | ORAL | Status: DC | PRN
  Administered 2023-10-29 – 2023-10-30 (×5): 10 mg via ORAL
  Filled 2023-10-29 (×5): qty 2

## 2023-10-29 MED ORDER — ACETAMINOPHEN 500 MG PO TABS
1000.0000 mg | ORAL_TABLET | Freq: Four times a day (QID) | ORAL | Status: DC
  Administered 2023-10-29 – 2023-10-30 (×4): 1000 mg via ORAL
  Filled 2023-10-29 (×4): qty 2

## 2023-10-29 MED ORDER — HYDROMORPHONE HCL 1 MG/ML IJ SOLN
1.0000 mg | INTRAMUSCULAR | Status: DC | PRN
  Administered 2023-10-29 – 2023-10-30 (×2): 1 mg via INTRAVENOUS
  Filled 2023-10-29 (×2): qty 1

## 2023-10-29 NOTE — TOC Progression Note (Signed)
 Transition of Care [facility]) - Progression Note    Patient Details  Name: Natalie Edwards MRN: [id number] Date of Birth: Aug 01, 1965  Transition of Care [facility]) CM/SW Contact  Tom-Johnson, Ameli Sangiovanni Daphne, RN Phone Number: 10/29/2023, 2:46 PM  Clinical Narrative:     PT/OT recommends Outpatient PT/OT, patient to resume therapy at [facility] on [address] for Shoulder. Order and referral on AVS.   Patient not Medically ready for discharge.  CM will continue to follow as patient progresses with care towards discharge.       Expected Discharge Plan: Home/Self Care Barriers to Discharge: Continued Medical Work up               Expected Discharge Plan and Services In-house Referral: Clinical Social Work     Living arrangements for the past 2 months: Single Family Home                                       Social Drivers of Health (SDOH) Interventions SDOH Screenings   Food Insecurity: No Food Insecurity (10/22/2023)  Housing: High Risk (10/22/2023)  Transportation Needs: No Transportation Needs (10/22/2023)  Utilities: Patient Declined (10/22/2023)  Alcohol Screen: Low Risk  (05/26/2023)  Depression (PHQ2-9): High Risk (07/07/2023)  Financial Resource Strain: Low Risk  (03/06/2023)  Physical Activity: Sufficiently Active (05/22/2022)  Social Connections: Socially Isolated (05/26/2023)  Stress: Stress Concern Present (03/06/2023)  Tobacco Use: High Risk (10/27/2023)  Health Literacy: Adequate Health Literacy (07/07/2023)    Readmission Risk Interventions    10/28/2023    9:14 AM 05/26/2023   10:15 AM 09/30/2022    3:47 PM  Readmission Risk Prevention Plan  Transportation Screening Complete Complete Complete  PCP or Specialist Appt within 5-7 Days   Complete  Home Care Screening   Complete  Medication Review (RN CM)   Referral to Pharmacy  Medication Review (RN Care Manager) Referral to Pharmacy Complete   PCP or Specialist appointment within 3-5 days of discharge Complete Complete    HRI or Home Care Consult Complete Complete   SW Recovery Care/Counseling Consult Complete Complete   Palliative Care Screening Not Applicable Not Applicable   Skilled Nursing Facility Not Applicable Not Applicable

## 2023-10-29 NOTE — Progress Notes (Signed)
 2 Days Post-Op  Subjective: Having some pain, but just got pain medication.  No nausea/vomiting.  Ate some toast this morning, but not much appetite.  No flatus or BM yet.  mobilizing  Objective: Vital signs in last 24 hours: Temp:  [97.9 F (36.6 C)-98.6 F (37 C)] 98.1 F (36.7 C) (08/14 0805) Pulse Rate:  [104-110] 110 (08/14 0805) Resp:  [18-20] 18 (08/14 0805) BP: (97-126)/(55-80) 103/80 (08/14 0805) SpO2:  [90 %-94 %] 93 % (08/14 0805) Weight:  [84.6 kg] 84.6 kg (08/14 0535) Last BM Date : 10/26/23  Intake/Output from previous day: 08/13 0701 - 08/14 0700 In: 1519.6 [P.O.:960; I.V.:559.6] Out: -  Intake/Output this shift: No intake/output data recorded.  PE: Gen: NAD, laying in bed Abd: soft, appropriately tender, ND, incisions c/d/I with gauze and tegaderm/honeycomb present  Lab Results:  Recent Labs    10/27/23 0454 10/28/23 0456  WBC 14.8* 13.3*  HGB 11.4* 10.6*  HCT 35.1* 32.8*  PLT 582* 589*   BMET Recent Labs    10/27/23 0454 10/28/23 0456  NA 138 137  K 3.5 3.8  CL 100 101  CO2 24 25  GLUCOSE 138* 126*  BUN 6 <5*  CREATININE 1.07* 0.94  CALCIUM  9.2 8.7*   PT/INR No results for input(s): LABPROT, INR in the last 72 hours. CMP     Component Value Date/Time   NA 137 10/28/2023 0456   NA 144 10/08/2022 1132   K 3.8 10/28/2023 0456   CL 101 10/28/2023 0456   CO2 25 10/28/2023 0456   GLUCOSE 126 (H) 10/28/2023 0456   BUN <5 (L) 10/28/2023 0456   BUN 17 10/08/2022 1132   CREATININE 0.94 10/28/2023 0456   CREATININE 1.24 (H) 10/10/2022 1050   CALCIUM  8.7 (L) 10/28/2023 0456   PROT 7.0 10/25/2023 0723   PROT 6.9 10/08/2022 1132   ALBUMIN  3.1 (L) 10/25/2023 0723   ALBUMIN  4.2 10/08/2022 1132   AST 30 10/25/2023 0723   AST 12 (L) 10/10/2022 1050   ALT 39 10/25/2023 0723   ALT 15 10/10/2022 1050   ALKPHOS 63 10/25/2023 0723   BILITOT 0.8 10/25/2023 0723   BILITOT 0.2 (L) 10/10/2022 1050   GFRNONAA >60 10/28/2023 0456    GFRNONAA 51 (L) 10/10/2022 1050   GFRAA 51 (L) 03/08/2019 0738   Lipase     Component Value Date/Time   LIPASE 29 10/22/2023 1041       Studies/Results: No results found.  Anti-infectives: Anti-infectives (From admission, onward)    Start     Dose/Rate Route Frequency Ordered Stop   10/27/23 0930  fluconazole  (DIFLUCAN ) tablet 150 mg        150 mg Oral  Once 10/27/23 9167 10/27/23 1015   10/26/23 2200  neomycin  (MYCIFRADIN ) tablet 1,000 mg  Status:  Discontinued       Placed in And Linked Group   1,000 mg Oral 3 times per day 10/26/23 1905 10/27/23 1811   10/26/23 2200  metroNIDAZOLE  (FLAGYL ) tablet 1,000 mg  Status:  Discontinued       Placed in And Linked Group   1,000 mg Oral 3 times per day 10/26/23 1905 10/27/23 1811   10/23/23 0000  piperacillin -tazobactam (ZOSYN ) IVPB 3.375 g  Status:  Discontinued       Placed in Followed by Linked Group   3.375 g 12.5 mL/hr over 240 Minutes Intravenous Every 8 hours 10/22/23 1727 10/29/23 0915   10/22/23 1815  piperacillin -tazobactam (ZOSYN ) IVPB 3.375 g  Placed in Followed by Linked Group   3.375 g 100 mL/hr over 30 Minutes Intravenous  Once 10/22/23 1727 10/22/23 1853   10/22/23 1030  ciprofloxacin  (CIPRO ) IVPB 400 mg        400 mg 200 mL/hr over 60 Minutes Intravenous  Once 10/22/23 1028 10/22/23 1241   10/22/23 1030  metroNIDAZOLE  (FLAGYL ) IVPB 500 mg        500 mg 100 mL/hr over 60 Minutes Intravenous  Once 10/22/23 1028 10/22/23 1315        Assessment/Plan POD 2, s/p laparoscopic sigmoid colectomy, Dr. Lyndel 8/12 for recurrent sigmoid diverticulitis -continue to monitor oral intake.  On a soft diet, but no flatus or BM yet.  Encouraged her to eat soft, thinner type foods for now until she has better bowel function -cont to mobilize -DC zosyn . -IS/pulm toilet -path is in process still -d/w primary service on ward  FEN - soft VTE - heparin  ID - zosyn , stopped 8/14  HTN PSA Tobacco  abuse GERD Anemia    LOS: 7 days    Burnard FORBES Banter , [facility] 10/29/2023, 10:01 AM Please see Amion for pager number during day hours 7:00am-4:30pm or 7:00am -11:30am on weekends

## 2023-10-29 NOTE — Plan of Care (Signed)
  Problem: Safety: Goal: Ability to remain free from injury will improve Outcome: Progressing  Interventions Assess risk factors for falls Provide a safe environment Provide venous thromboembolism (VTE) prophylaxis

## 2023-10-29 NOTE — Progress Notes (Addendum)
 [facility] PROGRESS NOTE    Progress Note  Natalie Edwards  FMW:[id number] DOB: [birth year] DOA: 10/22/2023 PCP: Tanda Bleacher, MD     Brief Narrative:   Natalie Edwards is an 58 y.o. female past medical history of diverticular disease with recurrent diverticulitis, renal mass, polysubstance abuse including cocaine and alcohol major depressive disorder multiple suicide attempts comes in with worsening abdominal pain CT scan of the abdomen and pelvis on 10/21/2023 showed diverticulosis with acute sigmoid diverticulitis with start empirically on antibiotics and IV fluids General Surgery was also consulted, GI was consulted which they have signed off.  Assessment/Plan:   Recurrent acute diverticulitis General Surgery was consulted and recommended bowel prep with surgical intervention. She is status post laparoscopy with sigmoid colectomy on 10/27/2023.   She is having pain passing gas has not a bowel movement. Continue IV morphine  will add oral oxycodone . Tolerating her diet ate about 50%. Further management per general surgery.  Appreciate surgery taking over.  Polysubstance abuse: UDS was positive for benzos and opiates. No signs of withdrawal continue CIWA protocol thiamine  folate.  Major depressive disorder with history of suicidal ideation: Psych consulted on 10/23/2023. They recommended to continue BuSpar  and fluoxetine . Ativan  as needed psych has signed off.  Tobacco abuse: Counseling.  GERD: Continue PPI.  Hypomagnesemia: Repleted and improved.  Anemia of chronic renal disease: Hemoglobin stable.  Thrombocytosis: Likely reactive still elevated. Continue to monitor.  Elevated LFTs: Likely due to infection.  Obesity: Noted.    DVT prophylaxis: lovenox  Family Communication:none Status is: Inpatient Remains inpatient appropriate because: Acute recurrent diverticulitis    Code Status:     Code Status Orders  (From admission, onward)           Start      Ordered   10/22/23 1339  Full code  Continuous       Question:  By:  Answer:  Other   10/22/23 1341           Code Status History     Date Active Date Inactive Code Status Order ID Comments User Context   10/07/2023 2103 10/08/2023 1719 Full Code [id number]  Rosan Sherlean VEAR DEVONNA ED   05/26/2023 1613 06/12/2023 0114 Full Code [id number]  Wilkie Majel RAMAN, FNP Inpatient   05/23/2023 0057 05/26/2023 1612 Full Code [id number]  Debby Camila LABOR, MD ED   01/18/2023 1505 01/22/2023 2128 Limited: Do not attempt resuscitation (DNR) -DNR-LIMITED -Do Not Intubate/DNI  [id number]  Waddell Rake, MD ED   01/18/2023 1455 01/18/2023 1505 Limited: Do not attempt resuscitation (DNR) -DNR-LIMITED -Do Not Intubate/DNI  [id number]  Waddell Rake, MD ED   12/23/2022 0144 12/27/2022 1917 Full Code [id number]  Melvenia Madelene HERO, NP Inpatient   10/17/2022 2251 10/18/2022 1501 Full Code [id number]  Hugelmeyer, Alexis, DO ED   09/28/2022 1224 09/30/2022 2023 Full Code [id number]  Barbarann Nest, MD ED         IV Access:   Peripheral IV   Procedures and diagnostic studies:   No results found.   Medical Consultants:   None.   Subjective:    Natalie Edwards pain is not controlled has not had a bowel movement tolerating her diet.  Objective:    Vitals:   10/28/23 1653 10/28/23 2118 10/29/23 0535 10/29/23 0805  BP: 97/65 126/66 (!) 104/55 103/80  Pulse: (!) 110 (!) 104 (!) 110 (!) 110  Resp: 18 18 20 18   Temp: 98.1 F (36.7 C) 97.9 F (36.6 C) 98.6 F (37 C) 98.1 F (36.7  C)  TempSrc: Oral Oral Oral Oral  SpO2: 91% 94% 90% 93%  Weight:   84.6 kg   Height:       SpO2: 93 % O2 Flow Rate (L/min): 2 L/min   Intake/Output Summary (Last 24 hours) at 10/29/2023 0906 Last data filed at 10/28/2023 1817 Gross per 24 hour  Intake 1100 ml  Output --  Net 1100 ml   Filed Weights   10/26/23 0426 10/27/23 1227 10/29/23 0535  Weight: 85.5 kg 85.5 kg 84.6 kg    Exam: General exam: In no acute  distress. Respiratory system: Good air movement and clear to auscultation. Cardiovascular system: S1 & S2 heard, RRR. No JVD. Gastrointestinal system: Abdomen is nondistended, soft and nontender.  Extremities: No pedal edema. Skin: No rashes, lesions or ulcers Psychiatry: Judgement and insight appear normal. Mood & affect appropriate.  Data Reviewed:    Labs: Basic Metabolic Panel: Recent Labs  Lab 10/23/23 0610 10/24/23 0443 10/25/23 0723 10/27/23 0454 10/28/23 0456  NA 142 137 138 138 137  K 3.9 3.7 3.6 3.5 3.8  CL 104 102 102 100 101  CO2 23 22 21* 24 25  GLUCOSE 83 74 59* 138* 126*  BUN 9 8 9 6  <5*  CREATININE 1.08* 1.17* 1.10* 1.07* 0.94  CALCIUM  9.1 9.2 9.1 9.2 8.7*  MG  --  1.8 1.6* 1.7 1.7   GFR Estimated Creatinine Clearance: 67.9 mL/min (by C-G formula based on SCr of 0.94 mg/dL). Liver Function Tests: Recent Labs  Lab 10/22/23 1041 10/23/23 0610 10/24/23 0443 10/25/23 0723  AST 31 50* 52* 30  ALT 33 44 51* 39  ALKPHOS 73 67 67 63  BILITOT 0.2 0.5 0.8 0.8  PROT 7.6 6.8 6.7 7.0  ALBUMIN  3.6 3.2* 3.2* 3.1*   Recent Labs  Lab 10/22/23 1041  LIPASE 29   No results for input(s): AMMONIA in the last 168 hours. Coagulation profile No results for input(s): INR, PROTIME in the last 168 hours. COVID-19 Labs  No results for input(s): DDIMER, FERRITIN, LDH, CRP in the last 72 hours.  Lab Results  Component Value Date   SARSCOV2NAA NEGATIVE 12/22/2022   SARSCOV2NAA NEGATIVE 09/28/2022   SARSCOV2NAA NEGATIVE 08/28/2022   SARSCOV2NAA POSITIVE (A) 04/29/2022    CBC: Recent Labs  Lab 10/22/23 1041 10/23/23 0610 10/24/23 0443 10/25/23 0723 10/27/23 0454 10/28/23 0456  WBC 13.5* 11.9* 13.0* 14.0* 14.8* 13.3*  NEUTROABS 10.3*  --  8.4* 8.6* 9.8* 10.2*  HGB 11.5* 10.7* 11.0* 11.3* 11.4* 10.6*  HCT 35.8* 33.0* 33.5* 34.0* 35.1* 32.8*  MCV 81.2 81.5 79.6* 80.4 80.5 80.4  PLT 551* 502* 575* 578* 582* 589*   Cardiac Enzymes: No results  for input(s): CKTOTAL, CKMB, CKMBINDEX, TROPONINI in the last 168 hours. BNP (last 3 results) No results for input(s): PROBNP in the last 8760 hours. CBG: No results for input(s): GLUCAP in the last 168 hours. D-Dimer: No results for input(s): DDIMER in the last 72 hours. Hgb A1c: No results for input(s): HGBA1C in the last 72 hours. Lipid Profile: No results for input(s): CHOL, HDL, LDLCALC, TRIG, CHOLHDL, LDLDIRECT in the last 72 hours. Thyroid  function studies: No results for input(s): TSH, T4TOTAL, T3FREE, THYROIDAB in the last 72 hours.  Invalid input(s): FREET3 Anemia work up: No results for input(s): VITAMINB12, FOLATE, FERRITIN, TIBC, IRON , RETICCTPCT in the last 72 hours. Sepsis Labs: Recent Labs  Lab 10/22/23 1249 10/23/23 0610 10/24/23 0443 10/25/23 0723 10/27/23 0454 10/28/23 0456  WBC  --    < >  13.0* 14.0* 14.8* 13.3*  LATICACIDVEN 0.7  --   --   --   --   --    < > = values in this interval not displayed.   Microbiology Recent Results (from the past 240 hours)  Surgical pcr screen     Status: None   Collection Time: 10/27/23  3:32 AM   Specimen: Nasal Mucosa; Nasal Swab  Result Value Ref Range Status   MRSA, PCR NEGATIVE NEGATIVE Final   Staphylococcus aureus NEGATIVE NEGATIVE Final    Comment: (NOTE) The Xpert SA Assay (FDA approved for NASAL specimens in patients 66 years of age and older), is one component of a comprehensive surveillance program. It is not intended to diagnose infection nor to guide or monitor treatment. Performed at [facility], [address] [place], [place] [address]      Medications:    busPIRone   10 mg Oral BID   Chlorhexidine  Gluconate Cloth  6 each Topical Daily   FLUoxetine   20 mg Oral Daily   folic acid   1 mg Oral Daily   heparin   5,000 Units Subcutaneous Q12H   multivitamin with minerals  1 tablet Oral Daily   nicotine   21 mg Transdermal Daily    pantoprazole   40 mg Oral BID   sodium chloride  flush  3 mL Intravenous Q12H   thiamine   100 mg Oral Daily   Continuous Infusions:  lactated ringers  50 mL/hr at 10/28/23 1818   piperacillin -tazobactam (ZOSYN )  IV 3.375 g (10/29/23 0827)      LOS: 7 days   Erle Odell Castor  [facility]  10/29/2023, 9:06 AM

## 2023-10-29 NOTE — Plan of Care (Signed)
   Problem: Education: Goal: Knowledge of General Education information will improve Description: Including pain rating scale, medication(s)/side effects and non-pharmacologic comfort measures Outcome: Progressing   Problem: Health Behavior/Discharge Planning: Goal: Ability to manage health-related needs will improve Outcome: Progressing   Problem: Clinical Measurements: Goal: Ability to maintain clinical measurements within normal limits will improve Outcome: Progressing Goal: Will remain free from infection Outcome: Progressing Goal: Diagnostic test results will improve Outcome: Progressing Goal: Respiratory complications will improve Outcome: Progressing Goal: Cardiovascular complication will be avoided Outcome: Progressing   Problem: Activity: Goal: Risk for activity intolerance will decrease Outcome: Progressing   Problem: Nutrition: Goal: Adequate nutrition will be maintained Outcome: Progressing   Problem: Coping: Goal: Level of anxiety will decrease Outcome: Progressing   Problem: Elimination: Goal: Will not experience complications related to bowel motility Outcome: Progressing Goal: Will not experience complications related to urinary retention Outcome: Progressing   Problem: Pain Managment: Goal: General experience of comfort will improve and/or be controlled Outcome: Progressing   Problem: Safety: Goal: Ability to remain free from injury will improve Outcome: Progressing   Problem: Skin Integrity: Goal: Risk for impaired skin integrity will decrease Outcome: Progressing   Problem: Education: Goal: Understanding of discharge needs will improve Outcome: Progressing Goal: Verbalization of understanding of the causes of altered bowel function will improve Outcome: Progressing   Problem: Activity: Goal: Ability to tolerate increased activity will improve Outcome: Progressing   Problem: Bowel/Gastric: Goal: Gastrointestinal status for postoperative  course will improve Outcome: Progressing   Problem: Health Behavior/Discharge Planning: Goal: Identification of community resources to assist with postoperative recovery needs will improve Outcome: Progressing   Problem: Nutritional: Goal: Will attain and maintain optimal nutritional status will improve Outcome: Progressing   Problem: Clinical Measurements: Goal: Postoperative complications will be avoided or minimized Outcome: Progressing   Problem: Respiratory: Goal: Respiratory status will improve Outcome: Progressing   Problem: Skin Integrity: Goal: Will show signs of wound healing Outcome: Progressing

## 2023-10-30 ENCOUNTER — Other Ambulatory Visit (HOSPITAL_COMMUNITY): Payer: Self-pay

## 2023-10-30 ENCOUNTER — Encounter: Payer: Self-pay | Admitting: Internal Medicine

## 2023-10-30 DIAGNOSIS — K5792 Diverticulitis of intestine, part unspecified, without perforation or abscess without bleeding: Secondary | ICD-10-CM | POA: Diagnosis not present

## 2023-10-30 LAB — SURGICAL PATHOLOGY

## 2023-10-30 MED ORDER — METHOCARBAMOL 750 MG PO TABS
750.0000 mg | ORAL_TABLET | Freq: Three times a day (TID) | ORAL | 0 refills | Status: DC | PRN
  Filled 2023-10-30: qty 60, 20d supply, fill #0

## 2023-10-30 MED ORDER — OXYCODONE HCL 5 MG PO TABS
5.0000 mg | ORAL_TABLET | ORAL | 0 refills | Status: DC | PRN
  Filled 2023-10-30: qty 30, 7d supply, fill #0

## 2023-10-30 MED ORDER — ACETAMINOPHEN 500 MG PO TABS: 1000.0000 mg | ORAL_TABLET | Freq: Four times a day (QID) | ORAL | Status: DC | PRN

## 2023-10-30 NOTE — Progress Notes (Signed)
    Durable Medical Equipment  (From admission, onward)           Start     Ordered   10/30/23 0920  For home use only DME Bedside commode  Once       Comments: Patient is status post Laparoscopy with Sigmoid Colectomy on 10/27/2023, confined to one room, has Generalized Weakness and Decreased Activity Tolerance which necessitate recommendation for bedside commode.  Question:  Patient needs a bedside commode to treat with the following condition  Answer:  Weakness   10/30/23 0922

## 2023-10-30 NOTE — TOC Progression Note (Signed)
 Transition of Care [facility]) - Progression Note    Patient Details  Name: Natalie Edwards MRN: [id number] Date of Birth: 1965-08-18  Transition of Care [facility]) CM/SW Contact  Tom-Johnson, Harvest Muskrat, RN Phone Number: 10/30/2023, 9:28 AM  Clinical Narrative:     BSC ordered from [employer], Zachary to deliver to patient at bedside.  CM will continue to follow as patient progresses with care towards discharge.          Expected Discharge Plan: Home/Self Care Barriers to Discharge: Continued Medical Work up               Expected Discharge Plan and Services In-house Referral: Clinical Social Work     Living arrangements for the past 2 months: Single Family Home                                       Social Drivers of Health (SDOH) Interventions SDOH Screenings   Food Insecurity: No Food Insecurity (10/22/2023)  Housing: High Risk (10/22/2023)  Transportation Needs: No Transportation Needs (10/22/2023)  Utilities: Patient Declined (10/22/2023)  Alcohol Screen: Low Risk  (05/26/2023)  Depression (PHQ2-9): High Risk (07/07/2023)  Financial Resource Strain: Low Risk  (03/06/2023)  Physical Activity: Sufficiently Active (05/22/2022)  Social Connections: Socially Isolated (05/26/2023)  Stress: Stress Concern Present (03/06/2023)  Tobacco Use: High Risk (10/27/2023)  Health Literacy: Adequate Health Literacy (07/07/2023)    Readmission Risk Interventions    10/28/2023    9:14 AM 05/26/2023   10:15 AM 09/30/2022    3:47 PM  Readmission Risk Prevention Plan  Transportation Screening Complete Complete Complete  PCP or Specialist Appt within 5-7 Days   Complete  Home Care Screening   Complete  Medication Review (RN CM)   Referral to Pharmacy  Medication Review (RN Care Manager) Referral to Pharmacy Complete   PCP or Specialist appointment within 3-5 days of discharge Complete Complete   HRI or Home Care Consult Complete Complete   SW Recovery Care/Counseling Consult Complete  Complete   Palliative Care Screening Not Applicable Not Applicable   Skilled Nursing Facility Not Applicable Not Applicable

## 2023-10-30 NOTE — Discharge Summary (Signed)
 Patient ID: Natalie Edwards [id number] 26-Jun-1965 58 y.o.  Admit date: 10/22/2023 Discharge date: 10/30/2023  Admitting Diagnosis: Abdominal pain/diverticulitis History of alcohol abuse Tobacco abuse: Essential hypertension Generalized anxiety disorder/depression GERD/history of peptic ulcer disease  Discharge Diagnosis Patient Active Problem List   Diagnosis Date Noted   Abdominal pain 10/22/2023   MDD (major depressive disorder), recurrent episode, moderate (HCC) 10/08/2023   Alcohol abuse 10/08/2023   Suicidal ideations 10/08/2023   Pain in left shoulder 09/17/2023   Pain and swelling of lower extremity 05/28/2023   MDD (major depressive disorder), recurrent episode, severe (HCC) 05/26/2023   Ingestion of substance, intentional self-harm, sequela (HCC) 05/23/2023   SIRS (systemic inflammatory response syndrome) (HCC) 05/23/2023   Rhabdomyolysis 05/23/2023   Cocaine abuse (HCC) 05/23/2023   Intractable nausea and vomiting 01/18/2023   MDD (major depressive disorder) 12/23/2022   Grief 12/22/2022   Diverticulitis 10/17/2022   Melena 09/29/2022   Gastric ulcer without hemorrhage or perforation 09/29/2022   AKI (acute kidney injury) (HCC) 09/28/2022   Metabolic acidosis 09/28/2022   Epigastric pain 09/28/2022   Nausea 09/28/2022   Hyperlipidemia 12/14/2021   Prediabetes 12/14/2021   Anxiety and depression 05/14/2021   Breathing difficult 05/14/2021   GERD (gastroesophageal reflux disease)    Possible exposure to STD 01/14/2021   Chronic insomnia 10/18/2019   CKD (chronic kidney disease) 04/07/2019   GAD (generalized anxiety disorder) 05/07/2018   Chronic gastric ulcer 05/07/2018   Essential hypertension 05/07/2018   Iron  deficiency anemia due to chronic blood loss 09/23/2016   Peptic ulcer disease with hemorrhage 09/23/2016   Thrombocytosis 09/09/2016   Leukocytosis/thrombocytosis 09/09/2016   Gastric nodule 09/09/2016   H/O ulcer disease 07/31/2016     Consultants General surgery GI Hospitalist Psychiatry   Reason for Admission: Natalie Edwards is a 58 y.o. female with past medical history  of colon history of hydroxyzine , NSAIDs, Ambien , ibuprofen , past medical history diverticulitis, acute kidney injury, essential hypertension, iron  deficiency anemia from chronic blood loss, generalized anxiety disorder GERD, peptic ulcer disease with hemorrhage,, prediabetes, cocaine and alcohol abuse, rhabdomyolysis, major depression, history of suicidal ideation, is being admitted today at [facility] on October 22, 2023 with complaints of abdominal pain from diverticulitis no CT imaging of the abdomen today.  Patient has had multiple visits in the emergency room from the past few months for abdominal pain.  Per report patient's pain has been worse over the past few days and more constant today been crampy and also reports nausea and vomiting unable to keep anything down is very dehydrated and fatigued .  Otherwise at bedside patient is in no distress and disoriented.  No reports of tobacco alcohol or drugs.   Procedures Dr. Deward Foy, 10/27/23 COLECTOMY, SIGMOID, LAPAROSCOPIC MOBILIZATION, SPLENIC FLEXURE  Hospital Course:  The patient was admitted and started on IV abx therapy.  Ultimately her pain persisted despite this being uncomplicated and the patient stated she was tired of waiting and having pain and wanted an operation.  She was prepped and then underwent the above procedure.  She tolerated this procedure well.  Her diet was able to be advanced as tolerated postoperatively.  Her Zosyn  was completed on POD 2.  She began having BMs and flatus on POD 2.  She was tolerating a solid diet and stable for DC home on POD 3.  Physical Exam: Gen: NAD, sitting up in a chair Abd: soft, appropriately tender, ND, incisions c/d/I with honeycomb, gauze, tegaderm present, ND  Allergies as of 10/30/2023  Reactions   Hydroxyzine  Palpitations, Other  (See Comments)   Jitteriness, too   Nsaids Other (See Comments)   History of ulcers   Ambien  [zolpidem  Tartrate] Other (See Comments)   Jitteriness, nervousness, abdominal pain   Ibuprofen  Other (See Comments)   History of ulcers        Medication List     STOP taking these medications    ciprofloxacin  500 MG tablet Commonly known as: CIPRO    gabapentin  300 MG capsule Commonly known as: NEURONTIN    HYDROcodone -acetaminophen  5-325 MG tablet Commonly known as: NORCO/VICODIN   metroNIDAZOLE  500 MG tablet Commonly known as: FLAGYL        TAKE these medications    acetaminophen  500 MG tablet Commonly known as: TYLENOL  Take 2 tablets (1,000 mg total) by mouth every 6 (six) hours as needed.   busPIRone  7.5 MG tablet Commonly known as: BUSPAR  Take 7.5 mg by mouth 2 (two) times daily.   dicyclomine  20 MG tablet Commonly known as: BENTYL  Take 1 tablet (20 mg total) by mouth 2 (two) times daily.   FLUoxetine  20 MG capsule Commonly known as: PROZAC  Take 20 mg by mouth daily.   methocarbamol  750 MG tablet Commonly known as: ROBAXIN  Take 1 tablet (750 mg total) by mouth every 8 (eight) hours as needed for muscle spasms.   mirtazapine  45 MG tablet Commonly known as: REMERON  Take 45 mg by mouth at bedtime.   naphazoline-glycerin  0.012-0.25 % Soln Commonly known as: CLEAR EYES REDNESS Place 1-2 drops into both eyes 4 (four) times daily as needed for eye irritation.   nicotine  21 mg/24hr patch Commonly known as: NICODERM CQ  - dosed in mg/24 hours Place 1 patch (21 mg total) onto the skin daily.   oxyCODONE  5 MG immediate release tablet Commonly known as: Oxy IR/ROXICODONE  Take 1-2 tablets (5-10 mg total) by mouth every 4 (four) hours as needed for moderate pain (pain score 4-6) or severe pain (pain score 7-10).   pantoprazole  40 MG tablet Commonly known as: PROTONIX  Take 1 tablet (40 mg total) by mouth 2 (two) times daily.   pregabalin  75 MG capsule Commonly  known as: LYRICA  Take 1 capsule (75 mg total) by mouth 2 (two) times daily.   risperiDONE  1 MG tablet Commonly known as: RISPERDAL  Take 1 mg by mouth 2 (two) times daily.   temazepam  15 MG capsule Commonly known as: RESTORIL  Take 15 mg by mouth at bedtime as needed.               Durable Medical Equipment  (From admission, onward)           Start     Ordered   10/30/23 0920  For home use only DME Bedside commode  Once       Comments: Patient is status post Laparoscopy with Sigmoid Colectomy on 10/27/2023, confined to one room, has Generalized Weakness and Decreased Activity Tolerance which necessitate recommendation for bedside commode.  Question:  Patient needs a bedside commode to treat with the following condition  Answer:  Weakness   10/30/23 0922              Follow-up Information     [facility] Follow up.   Specialty: Rehabilitation Why: Call to schedule resumption of care visit. Contact information: [address] [place]  [address] [phone]        [facility], [place] Follow up on 11/10/2023.   Specialty: General Surgery Why: 8/26 at 10am. This is  a nurse vist for staple removal. Please arrive 30 minutes prior to your appointment for paperwork. Please bring a copy of your photo ID and insurance card. Contact information: [address] [place] [address] [phone]         Stechschulte, Deward PARAS, MD Follow up on 11/20/2023.   Specialty: Surgery Why: 2pm. Please arrive 30 minutes prior to your appointment for paperwork. Please bring a copy of your photo ID and insurance card. Contact information: [address] [place] [address] [phone]                 Signed: Burnard Banter, [facility] Surgery 10/30/2023, 10:57 AM Please see Amion for pager number during day hours 7:00am-4:30pm, 7-11:30am on  Weekends

## 2023-10-30 NOTE — Progress Notes (Signed)
 DISCHARGE NOTE HOME Rubby Barbary to be discharged Home per MD order. Discussed prescriptions and follow up appointments with the patient. Prescriptions given to patient; medication list explained in detail. Patient verbalized understanding.  Skin clean, dry and intact without evidence of skin break down, no evidence of skin tears noted. IV catheter discontinued intact. Site without signs and symptoms of complications. Dressing and pressure applied. Pt denies pain at the site currently. No complaints noted.  Patient free of lines, drains, and wounds.   An After Visit Summary (AVS) was printed and given to the patient. Patient escorted via wheelchair, and discharged home via private auto.  Doyal Sias, RN

## 2023-10-30 NOTE — Plan of Care (Signed)
   Problem: Education: Goal: Knowledge of General Education information will improve Description: Including pain rating scale, medication(s)/side effects and non-pharmacologic comfort measures Outcome: Progressing   Problem: Health Behavior/Discharge Planning: Goal: Ability to manage health-related needs will improve Outcome: Progressing   Problem: Clinical Measurements: Goal: Ability to maintain clinical measurements within normal limits will improve Outcome: Progressing Goal: Will remain free from infection Outcome: Progressing Goal: Diagnostic test results will improve Outcome: Progressing Goal: Respiratory complications will improve Outcome: Progressing Goal: Cardiovascular complication will be avoided Outcome: Progressing   Problem: Activity: Goal: Risk for activity intolerance will decrease Outcome: Progressing   Problem: Nutrition: Goal: Adequate nutrition will be maintained Outcome: Progressing   Problem: Coping: Goal: Level of anxiety will decrease Outcome: Progressing   Problem: Elimination: Goal: Will not experience complications related to bowel motility Outcome: Progressing Goal: Will not experience complications related to urinary retention Outcome: Progressing   Problem: Pain Managment: Goal: General experience of comfort will improve and/or be controlled Outcome: Progressing   Problem: Safety: Goal: Ability to remain free from injury will improve Outcome: Progressing   Problem: Skin Integrity: Goal: Risk for impaired skin integrity will decrease Outcome: Progressing   Problem: Education: Goal: Understanding of discharge needs will improve Outcome: Progressing Goal: Verbalization of understanding of the causes of altered bowel function will improve Outcome: Progressing   Problem: Activity: Goal: Ability to tolerate increased activity will improve Outcome: Progressing   Problem: Bowel/Gastric: Goal: Gastrointestinal status for postoperative  course will improve Outcome: Progressing   Problem: Health Behavior/Discharge Planning: Goal: Identification of community resources to assist with postoperative recovery needs will improve Outcome: Progressing   Problem: Nutritional: Goal: Will attain and maintain optimal nutritional status will improve Outcome: Progressing   Problem: Clinical Measurements: Goal: Postoperative complications will be avoided or minimized Outcome: Progressing   Problem: Respiratory: Goal: Respiratory status will improve Outcome: Progressing   Problem: Skin Integrity: Goal: Will show signs of wound healing Outcome: Progressing

## 2023-10-30 NOTE — Progress Notes (Addendum)
 Physical Therapy Treatment Patient Details Name: Natalie Edwards MRN: [id number] DOB: 04-Aug-1965 Today's Date: 10/30/2023   History of Present Illness Pt is a 58 y.o. F who presents 10/22/2023 with worsening abdominal pain. CT scan of the abdomen and pelvis on 10/21/2023 showed diverticulosis with acute sigmoid diverticulitis with start empirically on antibiotics and IV fluids. S/p laparoscopy with sigmoid colectomy on 8/12. PMH: HTN, GERD, Anxiety, depression, hx of SI with suicide attempts, hx of gastric ulcers, insomnia, leukocytosis.    PT Comments  Pt received up in chair and is agreeable to participate. Pt denies pain; reports mild abdominal soreness and having to get up and down to bathroom all night. Pt with improved activity tolerance and ambulation distance, ambulating 200 ft with no assistive device and negotiated a half flight of stairs without physical assist. Recommend continued OPPT for shoulder at d/c.    If plan is discharge home, recommend the following: Assistance with cooking/housework;Assist for transportation;Help with stairs or ramp for entrance   Can travel by private vehicle        Equipment Recommendations  BSC/3in1    Recommendations for Other Services       Precautions / Restrictions Precautions Precautions: Fall Restrictions Weight Bearing Restrictions Per Provider Order: No     Mobility  Bed Mobility               General bed mobility comments: OOB in chair    Transfers Overall transfer level: Modified independent Equipment used: None               General transfer comment: Pushing off of armrests, increased time to rise, no physical assist    Ambulation/Gait Ambulation/Gait assistance: Supervision Gait Distance (Feet): 200 Feet Assistive device: None Gait Pattern/deviations: Step-through pattern, Decreased stride length Gait velocity: decreased     General Gait Details: Slow pace, good posture, no gross unsteadiness  noted   Stairs Stairs: Yes Stairs assistance: Supervision Stair Management: One rail Left Number of Stairs: 11 General stair comments: step by step pattern   Wheelchair Mobility     Tilt Bed    Modified Rankin (Stroke Patients Only)       Balance Overall balance assessment: Needs assistance Sitting-balance support: No upper extremity supported, Feet supported Sitting balance-Leahy Scale: Good     Standing balance support: During functional activity, No upper extremity supported Standing balance-Leahy Scale: Good                              Communication Communication Communication: No apparent difficulties  Cognition Arousal: Alert Behavior During Therapy: Flat affect   PT - Cognitive impairments: No apparent impairments                         Following commands: Intact      Cueing Cueing Techniques: Verbal cues  Exercises      General Comments        Pertinent Vitals/Pain Pain Assessment Pain Assessment: Faces Faces Pain Scale: Hurts a little bit Pain Location: lower abdomen; also reports stomach bubbling Pain Descriptors / Indicators: Guarding, Sore Pain Intervention(s): Monitored during session    Home Living                          Prior Function            PT Goals (current goals can now be found in the  care plan section) Acute Rehab PT Goals Patient Stated Goal: to return home PT Goal Formulation: With patient Time For Goal Achievement: 11/11/23 Potential to Achieve Goals: Good Progress towards PT goals: Progressing toward goals    Frequency    Min 1X/week      PT Plan      Co-evaluation              AM-PAC PT 6 Clicks Mobility   Outcome Measure  Help needed turning from your back to your side while in a flat bed without using bedrails?: None Help needed moving from lying on your back to sitting on the side of a flat bed without using bedrails?: None Help needed moving to and  from a bed to a chair (including a wheelchair)?: None Help needed standing up from a chair using your arms (e.g., wheelchair or bedside chair)?: None Help needed to walk in hospital room?: A Little Help needed climbing 3-5 steps with a railing? : A Little 6 Click Score: 22    End of Session   Activity Tolerance: Patient tolerated treatment well Patient left: with call bell/phone within reach;in chair Nurse Communication: Mobility status PT Visit Diagnosis: Difficulty in walking, not elsewhere classified (R26.2);Pain Pain - part of body:  (abdomen)     Time: 9194-9169 PT Time Calculation (min) (ACUTE ONLY): 25 min  Charges:    $Therapeutic Activity: 23-37 mins PT General Charges $$ ACUTE PT VISIT: 1 Visit                     Aleck Daring, PT, DPT Acute Rehabilitation Services Office [phone]    Alayne ONEIDA Daring 10/30/2023, 9:23 AM

## 2023-10-30 NOTE — Progress Notes (Signed)
 [facility] PROGRESS NOTE    Progress Note  Natalie Edwards  FMW:[id number] DOB: 11/21/1965 DOA: 10/22/2023 PCP: Tanda Bleacher, MD     Brief Narrative:   Natalie Edwards is an 58 y.o. female past medical history of diverticular disease with recurrent diverticulitis, renal mass, polysubstance abuse including cocaine and alcohol major depressive disorder multiple suicide attempts comes in with worsening abdominal pain CT scan of the abdomen and pelvis on 10/21/2023 showed diverticulosis with acute sigmoid diverticulitis with start empirically on antibiotics and IV fluids General Surgery was also consulted, GI was consulted which they have signed off.  Assessment/Plan:   Recurrent acute diverticulitis General Surgery was consulted and recommended bowel prep with surgical intervention. She is status post laparoscopy with sigmoid colectomy on 10/27/2023.   Passing gas have bowel movements, tolerated 100% of her diet. Continue management per surgery. Will continue to follow at a distance.  Polysubstance abuse: UDS was positive for benzos and opiates. No signs of withdrawal continue CIWA protocol thiamine  folate.  Major depressive disorder with history of suicidal ideation: Psych consulted on 10/23/2023. Continue BuSpar  and fluoxetine . No changes made to her medication.  Tobacco abuse: Counseling.  GERD: Continue PPI.  Hypomagnesemia: Repleted and improved.  Anemia of chronic renal disease: Hemoglobin stable.  Thrombocytosis: Likely reactive still elevated. Continue to monitor.  Elevated LFTs: Likely due to infection.  Obesity: Noted. DVT prophylaxis: lovenox  Family Communication:none Status is: Inpatient Remains inpatient appropriate because: Acute recurrent diverticulitis    Code Status:     Code Status Orders  (From admission, onward)           Start     Ordered   10/22/23 1339  Full code  Continuous       Question:  By:  Answer:  Other   10/22/23 1341            Code Status History     Date Active Date Inactive Code Status Order ID Comments User Context   10/07/2023 2103 10/08/2023 1719 Full Code [id number]  Rosan Sherlean VEAR DEVONNA ED   05/26/2023 1613 06/12/2023 0114 Full Code [id number]  Wilkie Majel RAMAN, FNP Inpatient   05/23/2023 0057 05/26/2023 1612 Full Code [id number]  Debby Camila LABOR, MD ED   01/18/2023 1505 01/22/2023 2128 Limited: Do not attempt resuscitation (DNR) -DNR-LIMITED -Do Not Intubate/DNI  [id number]  Waddell Rake, MD ED   01/18/2023 1455 01/18/2023 1505 Limited: Do not attempt resuscitation (DNR) -DNR-LIMITED -Do Not Intubate/DNI  [id number]  Waddell Rake, MD ED   12/23/2022 0144 12/27/2022 1917 Full Code [id number]  Melvenia Madelene HERO, NP Inpatient   10/17/2022 2251 10/18/2022 1501 Full Code [id number]  Hugelmeyer, Alexis, DO ED   09/28/2022 1224 09/30/2022 2023 Full Code [id number]  Barbarann Nest, MD ED         IV Access:   Peripheral IV   Procedures and diagnostic studies:   No results found.   Medical Consultants:   None.   Subjective:    Natalie Edwards is control having bowel movements.  Tolerated her meal  Objective:    Vitals:   10/29/23 1656 10/29/23 2101 10/30/23 0409 10/30/23 0720  BP: (!) 128/57 136/63 (!) 111/57 (!) 111/59  Pulse: (!) 109 99 90 94  Resp: 18   18  Temp: 97.8 F (36.6 C) 98.2 F (36.8 C) 98.3 F (36.8 C) 98.2 F (36.8 C)  TempSrc: Oral  Oral Oral  SpO2: 94% 95% 98% 98%  Weight:      Height:  SpO2: 98 % O2 Flow Rate (L/min): 2 L/min   Intake/Output Summary (Last 24 hours) at 10/30/2023 0829 Last data filed at 10/30/2023 0600 Gross per 24 hour  Intake 480 ml  Output 0 ml  Net 480 ml   Filed Weights   10/26/23 0426 10/27/23 1227 10/29/23 0535  Weight: 85.5 kg 85.5 kg 84.6 kg    Exam: General exam: In no acute distress. Respiratory system: Good air movement and clear to auscultation. Cardiovascular system: S1 & S2 heard, RRR. No  JVD. Gastrointestinal system: Abdomen is nondistended, soft and nontender.  Extremities: No pedal edema. Skin: No rashes, lesions or ulcers Psychiatry: Judgement and insight appear normal. Mood & affect appropriate. Data Reviewed:    Labs: Basic Metabolic Panel: Recent Labs  Lab 10/24/23 0443 10/25/23 0723 10/27/23 0454 10/28/23 0456  NA 137 138 138 137  K 3.7 3.6 3.5 3.8  CL 102 102 100 101  CO2 22 21* 24 25  GLUCOSE 74 59* 138* 126*  BUN 8 9 6  <5*  CREATININE 1.17* 1.10* 1.07* 0.94  CALCIUM  9.2 9.1 9.2 8.7*  MG 1.8 1.6* 1.7 1.7   GFR Estimated Creatinine Clearance: 67.9 mL/min (by C-G formula based on SCr of 0.94 mg/dL). Liver Function Tests: Recent Labs  Lab 10/24/23 0443 10/25/23 0723  AST 52* 30  ALT 51* 39  ALKPHOS 67 63  BILITOT 0.8 0.8  PROT 6.7 7.0  ALBUMIN  3.2* 3.1*   No results for input(s): LIPASE, AMYLASE in the last 168 hours.  No results for input(s): AMMONIA in the last 168 hours. Coagulation profile No results for input(s): INR, PROTIME in the last 168 hours. COVID-19 Labs  No results for input(s): DDIMER, FERRITIN, LDH, CRP in the last 72 hours.  Lab Results  Component Value Date   SARSCOV2NAA NEGATIVE 12/22/2022   SARSCOV2NAA NEGATIVE 09/28/2022   SARSCOV2NAA NEGATIVE 08/28/2022   SARSCOV2NAA POSITIVE (A) 04/29/2022    CBC: Recent Labs  Lab 10/24/23 0443 10/25/23 0723 10/27/23 0454 10/28/23 0456  WBC 13.0* 14.0* 14.8* 13.3*  NEUTROABS 8.4* 8.6* 9.8* 10.2*  HGB 11.0* 11.3* 11.4* 10.6*  HCT 33.5* 34.0* 35.1* 32.8*  MCV 79.6* 80.4 80.5 80.4  PLT 575* 578* 582* 589*   Cardiac Enzymes: No results for input(s): CKTOTAL, CKMB, CKMBINDEX, TROPONINI in the last 168 hours. BNP (last 3 results) No results for input(s): PROBNP in the last 8760 hours. CBG: No results for input(s): GLUCAP in the last 168 hours. D-Dimer: No results for input(s): DDIMER in the last 72 hours. Hgb A1c: No results for  input(s): HGBA1C in the last 72 hours. Lipid Profile: No results for input(s): CHOL, HDL, LDLCALC, TRIG, CHOLHDL, LDLDIRECT in the last 72 hours. Thyroid  function studies: No results for input(s): TSH, T4TOTAL, T3FREE, THYROIDAB in the last 72 hours.  Invalid input(s): FREET3 Anemia work up: No results for input(s): VITAMINB12, FOLATE, FERRITIN, TIBC, IRON , RETICCTPCT in the last 72 hours. Sepsis Labs: Recent Labs  Lab 10/24/23 0443 10/25/23 0723 10/27/23 0454 10/28/23 0456  WBC 13.0* 14.0* 14.8* 13.3*   Microbiology Recent Results (from the past 240 hours)  Surgical pcr screen     Status: None   Collection Time: 10/27/23  3:32 AM   Specimen: Nasal Mucosa; Nasal Swab  Result Value Ref Range Status   MRSA, PCR NEGATIVE NEGATIVE Final   Staphylococcus aureus NEGATIVE NEGATIVE Final    Comment: (NOTE) The Xpert SA Assay (FDA approved for NASAL specimens in patients 62 years of age and older), is one component  of a comprehensive surveillance program. It is not intended to diagnose infection nor to guide or monitor treatment. Performed at [facility], [address] [place], [place] [address]      Medications:    acetaminophen   1,000 mg Oral Q6H   busPIRone   10 mg Oral BID   Chlorhexidine  Gluconate Cloth  6 each Topical Daily   FLUoxetine   20 mg Oral Daily   folic acid   1 mg Oral Daily   heparin   5,000 Units Subcutaneous Q12H   methocarbamol   750 mg Oral TID   multivitamin with minerals  1 tablet Oral Daily   nicotine   21 mg Transdermal Daily   pantoprazole   40 mg Oral BID   sodium chloride  flush  3 mL Intravenous Q12H   thiamine   100 mg Oral Daily   Continuous Infusions:  lactated ringers  50 mL/hr at 10/30/23 0138      LOS: 8 days   Erle Odell Castor  [facility]  10/30/2023, 8:29 AM

## 2023-11-02 ENCOUNTER — Ambulatory Visit: Admitting: Physical Therapy

## 2023-11-02 ENCOUNTER — Telehealth: Payer: Self-pay

## 2023-11-02 NOTE — Transitions of Care (Post Inpatient/ED Visit) (Signed)
   11/02/2023  Name: Natalie Edwards MRN: [id number] DOB: [birth year]  Today's TOC FU Call Status: Today's TOC FU Call Status:: Unsuccessful Call (1st Attempt) Unsuccessful Call (1st Attempt) Date: 11/02/23  Attempted to reach the patient regarding the most recent Inpatient/ED visit.  Follow Up Plan: Additional outreach attempts will be made to reach the patient to complete the Transitions of Care (Post Inpatient/ED visit) call.   Signature  Slater Diesel, RN

## 2023-11-03 ENCOUNTER — Telehealth: Payer: Self-pay

## 2023-11-03 ENCOUNTER — Encounter (HOSPITAL_COMMUNITY): Payer: Self-pay

## 2023-11-03 ENCOUNTER — Emergency Department (HOSPITAL_COMMUNITY)
Admission: EM | Admit: 2023-11-03 | Discharge: 2023-11-04 | Disposition: A | Discharge status: Home / Self Care | Attending: Student | Admitting: Student

## 2023-11-03 ENCOUNTER — Other Ambulatory Visit: Payer: Self-pay

## 2023-11-03 DIAGNOSIS — Z79899 Other long term (current) drug therapy: Secondary | ICD-10-CM | POA: Diagnosis not present

## 2023-11-03 DIAGNOSIS — Z9151 Personal history of suicidal behavior: Secondary | ICD-10-CM | POA: Diagnosis not present

## 2023-11-03 DIAGNOSIS — F332 Major depressive disorder, recurrent severe without psychotic features: Secondary | ICD-10-CM | POA: Diagnosis not present

## 2023-11-03 DIAGNOSIS — K921 Melena: Secondary | ICD-10-CM

## 2023-11-03 DIAGNOSIS — T424X2A Poisoning by benzodiazepines, intentional self-harm, initial encounter: Secondary | ICD-10-CM | POA: Insufficient documentation

## 2023-11-03 DIAGNOSIS — I10 Essential (primary) hypertension: Secondary | ICD-10-CM | POA: Insufficient documentation

## 2023-11-03 DIAGNOSIS — R1084 Generalized abdominal pain: Secondary | ICD-10-CM | POA: Insufficient documentation

## 2023-11-03 DIAGNOSIS — T1491XA Suicide attempt, initial encounter: Secondary | ICD-10-CM

## 2023-11-03 DIAGNOSIS — K219 Gastro-esophageal reflux disease without esophagitis: Secondary | ICD-10-CM

## 2023-11-03 DIAGNOSIS — Z046 Encounter for general psychiatric examination, requested by authority: Secondary | ICD-10-CM

## 2023-11-03 LAB — ACETAMINOPHEN LEVEL
Acetaminophen (Tylenol), Serum: <10 ug/mL — ABNORMAL LOW (ref 10–30)
Acetaminophen (Tylenol), Serum: <10 ug/mL — ABNORMAL LOW (ref 10–30)

## 2023-11-03 LAB — CBC WITH DIFFERENTIAL/PLATELET
Abs Immature Granulocytes: 0.04 K/uL (ref 0.00–0.07)
Basophils Absolute: 0 K/uL (ref 0.0–0.1)
Basophils Relative: 0 %
Eosinophils Absolute: 0.2 K/uL (ref 0.0–0.5)
Eosinophils Relative: 2 %
HCT: 29.3 % — ABNORMAL LOW (ref 36.0–46.0)
Hemoglobin: 9.3 g/dL — ABNORMAL LOW (ref 12.0–15.0)
Immature Granulocytes: 0 %
Lymphocytes Relative: 25 %
Lymphs Abs: 2.5 K/uL (ref 0.7–4.0)
MCH: 25.5 pg — ABNORMAL LOW (ref 26.0–34.0)
MCHC: 31.7 g/dL (ref 30.0–36.0)
MCV: 80.5 fL (ref 80.0–100.0)
Monocytes Absolute: 1.2 K/uL — ABNORMAL HIGH (ref 0.1–1.0)
Monocytes Relative: 12 %
Neutro Abs: 5.9 K/uL (ref 1.7–7.7)
Neutrophils Relative %: 61 %
Platelets: 766 K/uL — ABNORMAL HIGH (ref 150–400)
RBC: 3.64 MIL/uL — ABNORMAL LOW (ref 3.87–5.11)
RDW: 16.9 % — ABNORMAL HIGH (ref 11.5–15.5)
WBC: 9.9 K/uL (ref 4.0–10.5)
nRBC: 0 % (ref 0.0–0.2)

## 2023-11-03 LAB — RAPID URINE DRUG SCREEN, HOSP PERFORMED
Amphetamines: NOT DETECTED
Barbiturates: NOT DETECTED
Benzodiazepines: POSITIVE — AB
Cocaine: NOT DETECTED
Opiates: NOT DETECTED
Tetrahydrocannabinol: NOT DETECTED

## 2023-11-03 LAB — ETHANOL: Alcohol, Ethyl (B): <15 mg/dL (ref <15)

## 2023-11-03 LAB — COMPREHENSIVE METABOLIC PANEL WITH GFR
ALT: 20 U/L (ref 0–44)
AST: 29 U/L (ref 15–41)
Albumin: 2.9 g/dL — ABNORMAL LOW (ref 3.5–5.0)
Alkaline Phosphatase: 56 U/L (ref 38–126)
Anion gap: 14 (ref 5–15)
BUN: 9 mg/dL (ref 6–20)
CO2: 22 mmol/L (ref 22–32)
Calcium: 8.7 mg/dL — ABNORMAL LOW (ref 8.9–10.3)
Chloride: 103 mmol/L (ref 98–111)
Creatinine, Ser: 0.9 mg/dL (ref 0.44–1.00)
GFR, Estimated: >60 mL/min (ref >60)
Glucose, Bld: 94 mg/dL (ref 70–99)
Potassium: 3.9 mmol/L (ref 3.5–5.1)
Sodium: 139 mmol/L (ref 135–145)
Total Bilirubin: 1.2 mg/dL (ref 0.0–1.2)
Total Protein: 6.4 g/dL — ABNORMAL LOW (ref 6.5–8.1)

## 2023-11-03 LAB — CBG MONITORING, ED: Glucose-Capillary: 102 mg/dL — ABNORMAL HIGH (ref 70–99)

## 2023-11-03 LAB — SALICYLATE LEVEL: Salicylate Lvl: <7 mg/dL — ABNORMAL LOW (ref 7.0–30.0)

## 2023-11-03 MED ORDER — SODIUM CHLORIDE 0.9 % IV BOLUS
1000.0000 mL | Freq: Once | INTRAVENOUS | Status: AC
  Administered 2023-11-03: 1000 mL via INTRAVENOUS

## 2023-11-03 NOTE — ED Notes (Signed)
 CCMD contacted to place pt on cardiac monitoring

## 2023-11-03 NOTE — Transitions of Care (Post Inpatient/ED Visit) (Signed)
 11/03/2023  Name: Natalie Edwards MRN: [id number] DOB: 1966-01-28  Today's TOC FU Call Status: Today's TOC FU Call Status:: Successful TOC FU Call Completed Unsuccessful Call (1st Attempt) Date: 11/02/23 [facility] FU Call Complete Date: 11/03/23 Patient's Name and Date of Birth confirmed.  Transition Care Management Follow-up Telephone Call Date of Discharge: 10/30/23 Discharge Facility: [facility]) Type of Discharge: Inpatient Admission Primary Inpatient Discharge Diagnosis:: diverticulitis How have you been since you were released from the hospital?: Same (She said she is in alot of pain. Her insurance company will not allow authorization for her oxycodone .  She has contacted her surgeon as well as her psychiatrist.) Any questions or concerns?: Yes Patient Questions/Concerns:: When I first asked her about reviewing her medication list she said she was in too much pain to review the list. She said she can't pick up her pain medication, oxycodone , until Thurs, 8/21/205 because the insurance company will not authorize it until then.  She said she has called the surgeon's office about the pain  and also reported to the surgeon that she has been experiencing nausea and diarrhea. She stated she is waiting for a call back from the surgeon.  We were able to review her med list and the only medication she confirmed she has is the robaxin .  She said she is out of lyrica  despite picking up an rx on 10/21/2023 and she said she must be taking it wrong. regarding her psychiatric medications, she stated she has contacted her psychiatrist and those medications are being filled. We discussed the fact that her anxiety is also negatively impacting her pain and she was in agreement that not having her behavioral health meds is not helping her manage her pain.  I praised  her for contacting the surgeon and the psychiatrist about her concerns and her need for her med refills and also instructed her to call PCE if she has  futher questions/concerns and she was very Adult nurse. Patient Questions/Concerns Addressed: Notified Provider of Patient Questions/Concerns  Items Reviewed: Did you receive and understand the discharge instructions provided?: Yes Medications obtained,verified, and reconciled?: Yes (Medications Reviewed) (please refer to notes above regarding the status of her medications.) Any new allergies since your discharge?: No Dietary orders reviewed?: Yes Type of Diet Ordered:: heart healthy, low sodium-she said she ahs not been able to tolerate any food. Do you have support at home?: Yes People in Home [RPT]: child(ren), adult Name of Support/Comfort Primary Source: her son  Medications Reviewed Today: Medications Reviewed Today     Reviewed by Marvis Bradley, RN (Case Manager) on 11/03/23 at 1058  Med List Status: <None>   Medication Order Taking? Sig Documenting Provider Last Dose Status Informant  acetaminophen  (TYLENOL ) 500 MG tablet [id number]  Take 2 tablets (1,000 mg total) by mouth every 6 (six) hours as needed. Tammy Sor, PA-C  Active   busPIRone  (BUSPAR ) 7.5 MG tablet [id number]  Take 7.5 mg by mouth 2 (two) times daily.  Patient not taking: Reported on 10/23/2023   [provider]  Active Self, Pharmacy Records, Multiple Informants           Med Note EFRAIM ALFREIDA CROME   Thu Oct 22, 2023  2:13 PM) LF: 10/09/23 for a 30ds  dicyclomine  (BENTYL ) 20 MG tablet [id number]  Take 1 tablet (20 mg total) by mouth 2 (two) times daily.  Patient not taking: Reported on 10/23/2023   Elnor Jayson LABOR, DO  Active Self, Pharmacy Records, Multiple Informants  Med Note EFRAIM, TYELISHA L   Thu Oct 22, 2023  2:13 PM) LF: 10/08/23 for a 10ds  FLUoxetine  (PROZAC ) 20 MG capsule [id number]  Take 20 mg by mouth daily.  Patient not taking: Reported on 10/23/2023   [provider]  Active Self, Pharmacy Records, Multiple Informants           Med Note EFRAIM, ALFREIDA CROME   Thu Oct 22, 2023  2:12 PM) LF: 10/15/23 for a 30ds  methocarbamol  (ROBAXIN ) 750 MG tablet [id number]  Take 1 tablet (750 mg total) by mouth every 8 (eight) hours as needed for muscle spasms. Tammy Sor, PA-C  Active   mirtazapine  (REMERON ) 45 MG tablet [id number]  Take 45 mg by mouth at bedtime.  Patient not taking: Reported on 10/23/2023   [provider]  Active Self, Pharmacy Records, Multiple Informants           Med Note (LEE, NICOLE   Thu Oct 08, 2023  3:23 AM) Patient is taking and stated it does not really help her with sleep  naphazoline-glycerin  (CLEAR EYES REDNESS) 0.012-0.25 % SOLN [id number]  Place 1-2 drops into both eyes 4 (four) times daily as needed for eye irritation.  Patient not taking: Reported on 10/23/2023   Jadapalle, Sree, MD  Active Self, Pharmacy Records, Multiple Informants  nicotine  (NICODERM CQ  - DOSED IN MG/24 HOURS) 21 mg/24hr patch [id number]  Place 1 patch (21 mg total) onto the skin daily.  Patient not taking: Reported on 10/23/2023   Jadapalle, Sree, MD  Active Self, Pharmacy Records, Multiple Informants  oxyCODONE  (OXY IR/ROXICODONE ) 5 MG immediate release tablet [id number]  Take 1-2 tablets (5-10 mg total) by mouth every 4 (four) hours as needed for moderate pain (pain score 4-6) or severe pain (pain score 7-10). Tammy Sor, PA-C  Active   pantoprazole  (PROTONIX ) 40 MG tablet [id number]  Take 1 tablet (40 mg total) by mouth 2 (two) times daily.  Patient not taking: Reported on 10/23/2023   May, Deanna J, NP  Active Self, Pharmacy Records, Multiple Informants           Med Note EFRAIM ALFREIDA CROME [place] Oct 22, 2023  2:14 PM) LF: 09/14/23 for a 90ds  pregabalin  (LYRICA ) 75 MG capsule [id number]  Take 1 capsule (75 mg total) by mouth 2 (two) times daily. Tanda Bleacher, MD  Active   risperiDONE  (RISPERDAL ) 1 MG tablet [id number]  Take 1 mg by mouth 2 (two) times daily.  Patient not taking: Reported on 10/23/2023   [provider]  Active Self, Pharmacy  Records, Multiple Informants           Med Note EFRAIM ALFREIDA CROME   Thu Oct 22, 2023  2:11 PM) LF: 10/16/23 for a 30ds  temazepam  (RESTORIL ) 15 MG capsule [id number]  Take 15 mg by mouth at bedtime as needed.  Patient not taking: Reported on 10/23/2023   [provider]  Active Self, Pharmacy Records, Multiple Informants           Med Note EFRAIM ALFREIDA CROME   Thu Oct 22, 2023  2:13 PM) LF: 09/29/23 for a 30ds            Home Care and Equipment/Supplies: Were Home Health Services Ordered?: No Any new equipment or medical supplies ordered?: Yes Name of Medical supply agency?: [employer]- Bedside commode Were you able to get the equipment/medical supplies?: Yes Do you have any questions related to the use of the equipment/supplies?: No  Functional Questionnaire: Do you need assistance with bathing/showering or dressing?: No Do you need assistance with meal preparation?: No Do you need assistance with eating?: No Do you have difficulty maintaining continence: No Do you need assistance with getting out of bed/getting out of a chair/moving?: No Do you have difficulty managing or taking your medications?: No  Follow up appointments reviewed: PCP Follow-up appointment confirmed?: Yes Date of PCP follow-up appointment?: 11/06/23 Follow-up Provider: Dr Tanda [facility] Follow-up appointment confirmed?: Yes Date of Specialist follow-up appointment?: 11/10/23 Follow-Up Specialty Provider:: surgeon.  She has an appointment with orthopedics on 11/04/2023 but she said she is planning to cancel that.  She also cancelled her appointment with outpatient rehab. Do you need transportation to your follow-up appointment?: No Do you understand care options if your condition(s) worsen?: Yes-patient verbalized understanding    SIGNATURE  Slater Diesel, RN

## 2023-11-03 NOTE — Telephone Encounter (Signed)
 FYI- she has an appointment with you 11/06/2023.  She said she is in alot of pain.  She has contacted her surgeon as well as her psychiatrist.   When I first asked her about reviewing her medication list she said she was in too much pain to review the list. She said she can't pick up her pain medication, oxycodone , until Thurs, 8/21/205 because the insurance company will not authorize it until then.  She said she has called the surgeon's office about the pain  and also reported to the surgeon that she has been experiencing nausea and diarrhea. She stated she is waiting for a call back from the surgeon.   We were able to review her med list and the only medication she confirmed she has is the robaxin .  She said she is out of lyrica  despite picking up an rx on 10/21/2023 and she said she must be taking it wrong. Regarding her psychiatric medications, she stated she has contacted her psychiatrist and those medications are being filled. We discussed the fact that her anxiety is also negatively impacting her pain and she was in agreement that not having her behavioral health meds is not helping her manage her pain.  I praised  her for contacting the surgeon and the psychiatrist about her concerns and her need for her med refills and also instructed her to call [facility]CE if she has futher questions/concerns and she was very Adult nurse.

## 2023-11-03 NOTE — ED Triage Notes (Signed)
 Pt bib ems from home; intentionally overdosed on 2 meds; hydroxyzine  and temazepam ; one filled one today, 1 filled yesterday; large quantities taken , unsure exactly how much; taken 35-45 min ago; pt states no longer wants to  live; vss; EKG unremarkable; iv established 22 ga lac; gcs 14

## 2023-11-03 NOTE — ED Provider Notes (Signed)
 [place] EMERGENCY DEPARTMENT AT [facility] Provider Note   CSN: [id number] Arrival date & time: 11/03/23  1853     Patient presents with: Suicide Attempt   Natalie Edwards is a 58 y.o. female.   58 y.o female with a PMH of HTN, Diverticulitis, Renal mass presents to the ED with [employer] due to intentional overdose.  Police officer was called by son, as patient had been found at her apartment having taken to different bottles of medication.  According to son these medications were filled yesterday, she had taken about half of each bottle of medication such as Temazepam  and hydroxyzine .  She tells me that she did this because I just do not want to be here anymore , she does have a prior history of suicide attempts, and does report trying to hurt herself today.  Patient also states that she is having some generalized abdominal pain, recent mission to the hospital due to diverticulitis which had a colectomy done.  Incisions are cover without any surrounding erythema.  Denies any HI, visual or auditory hallucinations.  The history is provided by the patient and the police.       Prior to Admission medications   Medication Sig Start Date End Date Taking? Authorizing Provider  methocarbamol  (ROBAXIN ) 750 MG tablet Take 1 tablet (750 mg total) by mouth every 8 (eight) hours as needed for muscle spasms. 10/30/23  Yes Tammy Sor, PA-C  ondansetron  (ZOFRAN -ODT) 4 MG disintegrating tablet Take 4 mg by mouth every 8 (eight) hours as needed. 11/03/23  Yes [provider]  oxyCODONE  (OXY IR/ROXICODONE ) 5 MG immediate release tablet Take 1-2 tablets (5-10 mg total) by mouth every 4 (four) hours as needed for moderate pain (pain score 4-6) or severe pain (pain score 7-10). 10/30/23  Yes Tammy Sor, PA-C  acetaminophen  (TYLENOL ) 500 MG tablet Take 2 tablets (1,000 mg total) by mouth every 6 (six) hours as needed. 10/30/23   Tammy Sor, PA-C  busPIRone  (BUSPAR ) 7.5 MG tablet Take 7.5  mg by mouth 2 (two) times daily. Patient not taking: Reported on 10/23/2023 09/15/23   [provider]  dicyclomine  (BENTYL ) 20 MG tablet Take 1 tablet (20 mg total) by mouth 2 (two) times daily. Patient not taking: Reported on 10/23/2023 10/08/23   Elnor Savant A, DO  FLUoxetine  (PROZAC ) 20 MG capsule Take 20 mg by mouth daily. Patient not taking: Reported on 10/23/2023 09/15/23   [provider]  hydrOXYzine  (ATARAX ) 50 MG tablet Take 50 mg by mouth at bedtime. Patient not taking: Reported on 11/03/2023 11/03/23   [provider]  mirtazapine  (REMERON ) 45 MG tablet Take 45 mg by mouth at bedtime. Patient not taking: Reported on 10/23/2023 09/25/23   [provider]  nicotine  (NICODERM CQ  - DOSED IN MG/24 HOURS) 21 mg/24hr patch Place 1 patch (21 mg total) onto the skin daily. Patient not taking: Reported on 10/23/2023 06/12/23   Jadapalle, Sree, MD  pantoprazole  (PROTONIX ) 40 MG tablet Take 1 tablet (40 mg total) by mouth 2 (two) times daily. Patient not taking: Reported on 10/23/2023 09/02/23   May, Deanna J, NP  pregabalin  (LYRICA ) 75 MG capsule Take 1 capsule (75 mg total) by mouth 2 (two) times daily. Patient not taking: Reported on 11/03/2023 10/23/23   Tanda Bleacher, MD  propranolol  (INDERAL ) 10 MG tablet Take 10 mg by mouth daily. Patient not taking: Reported on 11/03/2023 11/02/23   [provider]  risperiDONE  (RISPERDAL ) 1 MG tablet Take 1 mg by mouth  2 (two) times daily. Patient not taking: Reported on 10/23/2023    [provider]  temazepam  (RESTORIL ) 15 MG capsule Take 15 mg by mouth at bedtime as needed. Patient not taking: Reported on 10/23/2023 09/29/23   [provider]    Allergies: Hydroxyzine , Nsaids, Ambien  [zolpidem  tartrate], and Ibuprofen     Review of Systems  Constitutional:  Negative for chills and fever.  HENT:  Negative for sore throat.   Respiratory:  Negative for shortness of breath.   Cardiovascular:  Negative for chest  pain.  Gastrointestinal:  Negative for abdominal pain, nausea and vomiting.  Musculoskeletal:  Negative for back pain.  All other systems reviewed and are negative.   Updated Vital Signs BP 132/76   Pulse 73   Temp 98.6 F (37 C) (Temporal)   Resp 20   SpO2 94%   Physical Exam Vitals and nursing note reviewed.  Constitutional:      Appearance: Normal appearance.  HENT:     Head: Normocephalic and atraumatic.     Mouth/Throat:     Mouth: Mucous membranes are dry.  Eyes:     Pupils: Pupils are equal, round, and reactive to light.  Cardiovascular:     Rate and Rhythm: Normal rate.  Pulmonary:     Effort: Pulmonary effort is normal.     Breath sounds: No wheezing.  Abdominal:     General: Abdomen is flat.     Palpations: Abdomen is soft.     Tenderness: There is no abdominal tenderness. There is no guarding.     Comments: This abdomen is distended, wounds appear dry without any active drainage from them.  Covered by Band-Aids.  Musculoskeletal:     Cervical back: Normal range of motion and neck supple.  Skin:    General: Skin is warm and dry.  Neurological:     Mental Status: She is alert and oriented to person, place, and time.     (all labs ordered are listed, but only abnormal results are displayed) Labs Reviewed  CBC WITH DIFFERENTIAL/PLATELET - Abnormal; Notable for the following components:      Result Value   RBC 3.64 (*)    Hemoglobin 9.3 (*)    HCT 29.3 (*)    MCH 25.5 (*)    RDW 16.9 (*)    Platelets 766 (*)    Monocytes Absolute 1.2 (*)    All other components within normal limits  COMPREHENSIVE METABOLIC PANEL WITH GFR - Abnormal; Notable for the following components:   Calcium  8.7 (*)    Total Protein 6.4 (*)    Albumin  2.9 (*)    All other components within normal limits  RAPID URINE DRUG SCREEN, HOSP PERFORMED - Abnormal; Notable for the following components:   Benzodiazepines POSITIVE (*)    All other components within normal limits   SALICYLATE LEVEL - Abnormal; Notable for the following components:   Salicylate Lvl <7.0 (*)    All other components within normal limits  ACETAMINOPHEN  LEVEL - Abnormal; Notable for the following components:   Acetaminophen  (Tylenol ), Serum <10 (*)    All other components within normal limits  ACETAMINOPHEN  LEVEL - Abnormal; Notable for the following components:   Acetaminophen  (Tylenol ), Serum <10 (*)    All other components within normal limits  CBG MONITORING, ED - Abnormal; Notable for the following components:   Glucose-Capillary 102 (*)    All other components within normal limits  ETHANOL    EKG: None  Radiology: No results found.  Procedures   Medications Ordered in the ED  sodium chloride  0.9 % bolus 1,000 mL (1,000 mLs Intravenous New Bag/Given 11/03/23 2334)    Clinical Course as of 11/03/23 2352  Tue Nov 03, 2023  2244 Benzodiazepines(!): POSITIVE [JS]    Clinical Course User Index [JS] Aimy Sweeting, PA-C                                 Medical Decision Making Amount and/or Complexity of Data Reviewed Labs: ordered. Decision-making details documented in ED Course.   This patient presents to the ED for concern of OD, this involves a number of treatment options, and is a complaint that carries with it a high risk of complications and morbidity.  The differential diagnosis includes intentional overdose versus unintentional overdose.    Co morbidities: Discussed in HPI   Brief History:  See HPI.  EMR reviewed including pt PMHx, past surgical history and past visits to ER.   See HPI for more details  Lab Tests:  I ordered and independently interpreted labs.  The pertinent results include:    I personally reviewed all laboratory work and imaging. Metabolic panel without any acute abnormality specifically kidney function within normal limits and no significant electrolyte abnormalities. CBC without leukocytosis or significant anemia.   Imaging  Studies:  No imaging studies ordered for this patient  Cardiac Monitoring:  The patient was maintained on a cardiac monitor.  I personally viewed and interpreted the cardiac monitored which showed an underlying rhythm of: NSR 81, no prolongation of QTc EKG non-ischemic  Medicines ordered:  I ordered medication including bolus  for metabolize Reevaluation of the patient after these medicines showed that the patient improved I have reviewed the patients home medicines and have made adjustments as needed  Reevaluation:  After the interventions noted above I re-evaluated patient and found that they have :improved  Social Determinants of Health:  The patient's social determinants of health were a factor in the care of this patient   Problem List / ED Course:  Patient arriving to the ED with [employer] escort after intentional overdose which occurred 2 hours prior to arrival in the emergency department.  She was found by her son after having taken half a bottle of medication such as hydroxyzine  and temazepam .  She tells me that she no longer wants to be here .  She has a prior suicide attempt in the past with a similar presentation.  In the ED hemodynamically stable.  Does appear very sleepy and drowsy but is able to provide a full history.  Upon reviewing her records I do see that she recently had surgery such as a colectomy due to her diverticulitis with concern for abscess. 7:24 PM Call placed to poison control by me spoke to [facility] who recommends the following.  Observation for 8 hours since ingestion therefore 10pm, will need tylenol  level repeated, supportive care and IV fluids.   Dispostion:  Patient care signed out to incoming provider for medical clearance.   Portions of this note were generated with Scientist, clinical (histocompatibility and immunogenetics). Dictation errors may occur despite best attempts at proofreading.   Final diagnoses:  Suicide attempt [facility])  Involuntary commitment    ED Discharge  Orders     None          Hebah Bogosian, PA-C 11/03/23 2352    Albertina Dixon, MD 11/04/23 [phone]

## 2023-11-04 ENCOUNTER — Inpatient Hospital Stay
Admission: AD | Admit: 2023-11-04 | Discharge: 2023-11-10 | DRG: 885 | Disposition: A | Discharge status: Home / Self Care | Source: Intra-hospital | Attending: Psychiatry | Admitting: Psychiatry

## 2023-11-04 ENCOUNTER — Encounter: Admitting: Orthopedic Surgery

## 2023-11-04 ENCOUNTER — Encounter: Payer: Self-pay | Admitting: Psychiatry

## 2023-11-04 ENCOUNTER — Other Ambulatory Visit: Payer: Self-pay

## 2023-11-04 DIAGNOSIS — Z9152 Personal history of nonsuicidal self-harm: Secondary | ICD-10-CM | POA: Diagnosis not present

## 2023-11-04 DIAGNOSIS — Z604 Social exclusion and rejection: Secondary | ICD-10-CM | POA: Diagnosis present

## 2023-11-04 DIAGNOSIS — T424X2A Poisoning by benzodiazepines, intentional self-harm, initial encounter: Secondary | ICD-10-CM | POA: Diagnosis not present

## 2023-11-04 DIAGNOSIS — Z8711 Personal history of peptic ulcer disease: Secondary | ICD-10-CM | POA: Diagnosis not present

## 2023-11-04 DIAGNOSIS — I10 Essential (primary) hypertension: Secondary | ICD-10-CM | POA: Diagnosis present

## 2023-11-04 DIAGNOSIS — Z5982 Transportation insecurity: Secondary | ICD-10-CM | POA: Diagnosis not present

## 2023-11-04 DIAGNOSIS — F419 Anxiety disorder, unspecified: Secondary | ICD-10-CM | POA: Diagnosis present

## 2023-11-04 DIAGNOSIS — Z9049 Acquired absence of other specified parts of digestive tract: Secondary | ICD-10-CM

## 2023-11-04 DIAGNOSIS — F332 Major depressive disorder, recurrent severe without psychotic features: Secondary | ICD-10-CM | POA: Diagnosis present

## 2023-11-04 DIAGNOSIS — G4700 Insomnia, unspecified: Secondary | ICD-10-CM | POA: Diagnosis present

## 2023-11-04 DIAGNOSIS — Z5941 Food insecurity: Secondary | ICD-10-CM | POA: Diagnosis not present

## 2023-11-04 DIAGNOSIS — Z886 Allergy status to analgesic agent status: Secondary | ICD-10-CM

## 2023-11-04 DIAGNOSIS — Z801 Family history of malignant neoplasm of trachea, bronchus and lung: Secondary | ICD-10-CM | POA: Diagnosis not present

## 2023-11-04 DIAGNOSIS — R45851 Suicidal ideations: Secondary | ICD-10-CM | POA: Diagnosis present

## 2023-11-04 DIAGNOSIS — K219 Gastro-esophageal reflux disease without esophagitis: Secondary | ICD-10-CM | POA: Diagnosis present

## 2023-11-04 DIAGNOSIS — Z79899 Other long term (current) drug therapy: Secondary | ICD-10-CM | POA: Diagnosis not present

## 2023-11-04 DIAGNOSIS — Z888 Allergy status to other drugs, medicaments and biological substances status: Secondary | ICD-10-CM | POA: Diagnosis not present

## 2023-11-04 DIAGNOSIS — F17210 Nicotine dependence, cigarettes, uncomplicated: Secondary | ICD-10-CM | POA: Diagnosis present

## 2023-11-04 MED ORDER — LORAZEPAM 2 MG/ML IJ SOLN: 2.0000 mg | Freq: Three times a day (TID) | INTRAMUSCULAR | Status: DC | PRN

## 2023-11-04 MED ORDER — ALUM & MAG HYDROXIDE-SIMETH 200-200-20 MG/5ML PO SUSP: 30.0000 mL | ORAL | Status: DC | PRN

## 2023-11-04 MED ORDER — OXYCODONE HCL 5 MG PO TABS
5.0000 mg | ORAL_TABLET | ORAL | Status: DC | PRN
  Administered 2023-11-04: 5 mg via ORAL
  Filled 2023-11-04: qty 1

## 2023-11-04 MED ORDER — HALOPERIDOL LACTATE 5 MG/ML IJ SOLN: 5.0000 mg | Freq: Three times a day (TID) | INTRAMUSCULAR | Status: DC | PRN

## 2023-11-04 MED ORDER — DIPHENHYDRAMINE HCL 50 MG/ML IJ SOLN: 50.0000 mg | Freq: Three times a day (TID) | INTRAMUSCULAR | Status: DC | PRN

## 2023-11-04 MED ORDER — TRAZODONE HCL 50 MG PO TABS
50.0000 mg | ORAL_TABLET | Freq: Every evening | ORAL | Status: DC | PRN
  Administered 2023-11-04: 50 mg via ORAL
  Filled 2023-11-04: qty 1

## 2023-11-04 MED ORDER — DIPHENHYDRAMINE HCL 25 MG PO CAPS: 50.0000 mg | ORAL_CAPSULE | Freq: Three times a day (TID) | ORAL | Status: DC | PRN

## 2023-11-04 MED ORDER — HALOPERIDOL LACTATE 5 MG/ML IJ SOLN: 10.0000 mg | Freq: Three times a day (TID) | INTRAMUSCULAR | Status: DC | PRN

## 2023-11-04 MED ORDER — ACETAMINOPHEN 325 MG PO TABS
650.0000 mg | ORAL_TABLET | Freq: Four times a day (QID) | ORAL | Status: DC | PRN
  Administered 2023-11-06 – 2023-11-08 (×2): 650 mg via ORAL
  Filled 2023-11-04 (×2): qty 2

## 2023-11-04 MED ORDER — METHOCARBAMOL 500 MG PO TABS
750.0000 mg | ORAL_TABLET | Freq: Three times a day (TID) | ORAL | Status: DC | PRN
  Administered 2023-11-04: 750 mg via ORAL
  Filled 2023-11-04: qty 2

## 2023-11-04 MED ORDER — METHOCARBAMOL 500 MG PO TABS
750.0000 mg | ORAL_TABLET | Freq: Three times a day (TID) | ORAL | Status: DC | PRN
  Administered 2023-11-05: 750 mg via ORAL
  Filled 2023-11-04: qty 2

## 2023-11-04 MED ORDER — ACETAMINOPHEN 500 MG PO TABS
1000.0000 mg | ORAL_TABLET | Freq: Three times a day (TID) | ORAL | Status: DC
  Administered 2023-11-04: 1000 mg via ORAL
  Filled 2023-11-04: qty 2

## 2023-11-04 MED ORDER — MAGNESIUM HYDROXIDE 400 MG/5ML PO SUSP
30.0000 mL | Freq: Every day | ORAL | Status: DC | PRN
Start: 2023-11-04 — End: 2023-11-10

## 2023-11-04 MED ORDER — NICOTINE 14 MG/24HR TD PT24
14.0000 mg | MEDICATED_PATCH | Freq: Every day | TRANSDERMAL | Status: DC
  Administered 2023-11-05 – 2023-11-10 (×6): 14 mg via TRANSDERMAL
  Filled 2023-11-04 (×6): qty 1

## 2023-11-04 MED ORDER — HALOPERIDOL 5 MG PO TABS: 5.0000 mg | ORAL_TABLET | Freq: Three times a day (TID) | ORAL | Status: DC | PRN

## 2023-11-04 MED ORDER — OXYCODONE HCL 5 MG PO TABS
5.0000 mg | ORAL_TABLET | ORAL | Status: DC | PRN
  Administered 2023-11-04 – 2023-11-10 (×24): 5 mg via ORAL
  Filled 2023-11-04 (×24): qty 1

## 2023-11-04 NOTE — BH Assessment (Addendum)
 Comprehensive Clinical Assessment (CCA) Note  11/04/2023 [facility] [id number]  Disposition: Natalie Ivans, NP recommends inpatient treatment. CSW to seek placement. Disposition discussed with Natalie Moats, RN.   The patient demonstrates the following risk factors for suicide: Chronic risk factors for suicide include: psychiatric disorder of Major Depressive Disorder, recurrent, severe without psychotic features. Acute risk factors for suicide include: social withdrawal/isolation and Pt reports, she feels alone and she attempted suicidal. Protective factors for this patient include: None. Considering these factors, the overall suicide risk at this point appears to be high. Patient is not appropriate for outpatient follow up.  Natalie Edwards is a 58 year old female who presents voluntary and unaccompanied to [facility]) Clinician asked the pt, what brought you to the [facility]? Pt reports, she was feeling down and took her sleep medications (Hydroxyzine  and Temazepam ) to kill herself. Pt reports, she doesn't know how many pills she took. Pt reports, being alone, by myself, triggered her suicide attempt. Pt reports, her kids found her, she told them what she did and they called police. Pt reports, she has a panic attack yesterday morning (11/03/2023). Pt denies, HI, hallucinations, self-injurious behaviors and access to weapons.   Pt denies substance use. Pt is unsure the name of the person who prescribes her medications. Pt reports, a previous inpatient admission to [facility].   Pt presents quiet, awake wrapped in a blanket sitting on the edge of the Edwards bed with normal speech and eye contact. Pt's mood was depressed. Pt's affect was depressed, flat. Pt's insight was fair. Pt's judgement was poor.   Chief Complaint:  Chief Complaint  Patient presents with   Suicide Attempt   Visit Diagnosis: Major Depressive Disorder,  recurrent, severe without psychotic features.   CCA Screening, Triage and Referral (STR)  Patient Reported Information How did you hear about us ? Other (Comment) ([employer].)  What Is the Reason for Your Visit/Call Today? Pt attempted suicide by overdosing on medications. Pt denies, HI, hallucinations, self-injurious behaviors and access to weapons.  How Long Has This Been Causing You Problems? <Week  What Do You Feel Would Help You the Most Today? Treatment for Depression or other mood problem; Stress Management   Have You Recently Had Any Thoughts About Hurting Yourself? Yes  Are You Planning to Commit Suicide/Harm Yourself At This time? Yes   Flowsheet Row ED from 11/03/2023 in [facility] to Hosp-Admission (Discharged) from 10/22/2023 in [facility] ED from 10/20/2023 in [facility]  C-SSRS RISK CATEGORY High Risk No Risk No Risk    Have you Recently Had Thoughts About Hurting Someone Sherral? No  Are You Planning to Harm Someone at This Time? No  Explanation: NA   Have You Used Any Alcohol or Drugs in the Past 24 Hours? No  How Long Ago Did You Use Drugs or Alcohol? Pt denies.  What Did You Use and How Much? Pt denies.   Do You Currently Have a Therapist/Psychiatrist? Yes  Name of Therapist/Psychiatrist: Name of Therapist/Psychiatrist: Pt is unsure the name of the person who prescribes her medications.   Have You Been Recently Discharged From Any Office Practice or Programs? No  Explanation of Discharge From Practice/Program: NA    CCA Screening Triage Referral Assessment Type of Contact: Tele-Assessment  Telemedicine Service Delivery: Telemedicine service delivery: This service was provided via telemedicine using a 2-way, interactive  audio and video technology  Is this Initial or Reassessment? Is this Initial or Reassessment?: Initial Assessment  Date  Telepsych consult ordered in [facility]:  Date Telepsych consult ordered in CHL: 11/04/23  Time Telepsych consult ordered in CHL:  Time Telepsych consult ordered in CHL: 0106  Location of Assessment: [facility]  Provider Location: [facility]   Collateral Involvement: NA   Does Patient Have a Automotive engineer Guardian? No  Legal Guardian Contact Information: NA  Copy of Legal Guardianship Form: -- (NA)  Legal Guardian Notified of Arrival: -- (NA)  Legal Guardian Notified of Pending Discharge: -- (n/a)  If Minor and Not Living with Parent(s), Who has Custody? NA  Is CPS involved or ever been involved? -- (NA)  Is APS involved or ever been involved? -- (NA)   Patient Determined To Be At Risk for Harm To Self or Others Based on Review of Patient Reported Information or Presenting Complaint? -- (NA)  Method: Plan with intent and identified person  Availability of Means: In hand or used  Intent: Clearly intends on inflicting harm that could cause death  Notification Required: No need or identified person  Additional Information for Danger to Others Potential: -- (NA)  Additional Comments for Danger to Others Potential: NA  Are There Guns or Other Weapons in Your Home? No  Types of Guns/Weapons: Pt denies.  Are These Weapons Safely Secured?                            -- (NA)  Who Could Verify You Are Able To Have These Secured: NA  Do You Have any Outstanding Charges, Pending Court Dates, Parole/Probation? Pt denies.  Contacted To Inform of Risk of Harm To Self or Others: Other: Comment (NA)    Does Patient Present under Involuntary Commitment? No    [place] of Residence: [place]   Patient Currently Receiving the Following Edwards: Medication Management   Determination of Need: Emergent (2 hours)   Options For Referral: Inpatient Hospitalization; Medication Management; Outpatient Therapy; [facility]     CCA  Biopsychosocial Patient Reported Schizophrenia/Schizoaffective Diagnosis in Past: No   Strengths: Pt is open to get help.   Mental Health Symptoms Depression:  Fatigue; Hopelessness; Tearfulness; Sleep (too much or little) (Islolation.)   Duration of Depressive symptoms:    Mania:  None   Anxiety:   Worrying   Psychosis:  None   Duration of Psychotic symptoms:    Trauma:  None   Obsessions:  None   Compulsions:  None   Inattention:  None   Hyperactivity/Impulsivity:  None   Oppositional/Defiant Behaviors:  None   Emotional Irregularity:  Recurrent suicidal behaviors/gestures/threats; Potentially harmful impulsivity; Mood lability   Other Mood/Personality Symptoms:  NA    Mental Status Exam Appearance and self-care  Stature:  Average   Weight:  Average weight   Clothing:  -- (Wrapped in a blanket in scrubs.)   Grooming:  Normal   Cosmetic use:  None   Posture/gait:  Normal   Motor activity:  Not Remarkable   Sensorium  Attention:  Normal   Concentration:  Normal   Orientation:  X5   Recall/memory:  Normal   Affect and Mood  Affect:  Flat; Depressed   Mood:  Depressed   Relating  Eye contact:  Normal   Facial expression:  Depressed   Attitude toward examiner:  Cooperative   Thought and Language  Speech flow: Normal   Thought content:  Appropriate to Mood and Circumstances   Preoccupation:  None   Hallucinations:  None   Organization:  Coherent   Affiliated [employer] Edwards of Knowledge:  Fair   Intelligence:  Average   Abstraction:  Functional   Judgement:  Poor   Reality Testing:  Adequate   Insight:  Fair   Decision Making:  Impulsive   Social Functioning  Social Maturity:  Isolates   Social Judgement:  Heedless   Stress  Stressors:  Other (Comment) (Pt reports, paying bills.)   Coping Ability:  Overwhelmed; Deficient supports   Skill Deficits:  Decision making   Supports:  Support needed      Religion: Religion/Spirituality Are You A Religious Person?: Yes What is Your Religious Affiliation?: Baptist How Might This Affect Treatment?: NA  Leisure/Recreation: Leisure / Recreation Do You Have Hobbies?: No  Exercise/Diet: Exercise/Diet Do You Exercise?: No Have You Gained or Lost A Significant Amount of Weight in the Past Six Months?: No Do You Follow a Special Diet?: No Do You Have Any Trouble Sleeping?: Yes Explanation of Sleeping Difficulties: Pt reports, if she doesn't take her medications she will have sleeping difficulties.   CCA Employment/Education Employment/Work Situation: Employment / Work Situation Employment Situation: On disability Why is Patient on Disability: Derpession. How Long has Patient Been on Disability: 1 year Patient's Job has Been Impacted by Current Illness: No Has Patient ever Been in the Military?: No  Education: Education Is Patient Currently Attending School?: No Last Grade Completed: 12 Did You Attend College?: No Did You Have An Individualized Education Program (IIEP): No Did You Have Any Difficulty At School?: No Patient's Education Has Been Impacted by Current Illness: No   CCA Family/Childhood History Family and Relationship History: Family history Marital status: Single Does patient have children?: Yes How many children?: 3 How is patient's relationship with their children?: Pt reports, she has three adults children whom she doesn't have a good relationship with.  Childhood History:  Childhood History By whom was/is the patient raised?: Mother Did patient suffer any verbal/emotional/physical/sexual abuse as a child?: No Did patient suffer from severe childhood neglect?: No Has patient ever been sexually abused/assaulted/raped as an adolescent or adult?: No Was the patient ever a victim of a crime or a disaster?: No Witnessed domestic violence?: No Has patient been affected by domestic violence as an adult?:  No   CCA Substance Use Alcohol/Drug Use: Alcohol / Drug Use Pain Medications: See MAR Prescriptions: See MAR Over the Counter: See MAR History of alcohol / drug use?: No history of alcohol / drug abuse Longest period of sobriety (when/how long): NA Negative Consequences of Use:  (NA) Withdrawal Symptoms: None    ASAM's:  Six Dimensions of Multidimensional Assessment  Dimension 1:  Acute Intoxication and/or Withdrawal Potential:      Dimension 2:  Biomedical Conditions and Complications:      Dimension 3:  Emotional, Behavioral, or Cognitive Conditions and Complications:     Dimension 4:  Readiness to Change:     Dimension 5:  Relapse, Continued use, or Continued Problem Potential:     Dimension 6:  Recovery/Living Environment:     ASAM Severity Score:    ASAM Recommended Level of Treatment:     Substance use Disorder (SUD)    Recommendations for Edwards/Supports/Treatments: Recommendations for Edwards/Supports/Treatments Recommendations For Edwards/Supports/Treatments: Inpatient Hospitalization  Disposition Recommendation per psychiatric provider: We recommend inpatient psychiatric hospitalization when medically cleared. Patient is under voluntary  admission status at this time; please IVC if attempts to leave [facility].   DSM5 Diagnoses: Patient Active Problem List   Diagnosis Date Noted   Abdominal pain 10/22/2023   MDD (major depressive disorder), recurrent episode, moderate (HCC) 10/08/2023   Alcohol abuse 10/08/2023   Suicidal ideations 10/08/2023   Pain in left shoulder 09/17/2023   Pain and swelling of lower extremity 05/28/2023   MDD (major depressive disorder), recurrent episode, severe (HCC) 05/26/2023   Ingestion of substance, intentional self-harm, sequela (HCC) 05/23/2023   SIRS (systemic inflammatory response syndrome) (HCC) 05/23/2023   Rhabdomyolysis 05/23/2023   Cocaine abuse (HCC) 05/23/2023   Intractable nausea and vomiting 01/18/2023   MDD  (major depressive disorder) 12/23/2022   Grief 12/22/2022   Diverticulitis 10/17/2022   Melena 09/29/2022   Gastric ulcer without hemorrhage or perforation 09/29/2022   AKI (acute kidney injury) (HCC) 09/28/2022   Metabolic acidosis 09/28/2022   Epigastric pain 09/28/2022   Nausea 09/28/2022   Hyperlipidemia 12/14/2021   Prediabetes 12/14/2021   Anxiety and depression 05/14/2021   Breathing difficult 05/14/2021   GERD (gastroesophageal reflux disease)    Possible exposure to STD 01/14/2021   Chronic insomnia 10/18/2019   CKD (chronic kidney disease) 04/07/2019   GAD (generalized anxiety disorder) 05/07/2018   Chronic gastric ulcer 05/07/2018   Essential hypertension 05/07/2018   Iron  deficiency anemia due to chronic blood loss 09/23/2016   Peptic ulcer disease with hemorrhage 09/23/2016   Thrombocytosis 09/09/2016   Leukocytosis/thrombocytosis 09/09/2016   Gastric nodule 09/09/2016   H/O ulcer disease 07/31/2016     Referrals to Alternative Service(s): Referred to Alternative Service(s):   Place:   Date:   Time:    Referred to Alternative Service(s):   Place:   Date:   Time:    Referred to Alternative Service(s):   Place:   Date:   Time:    Referred to Alternative Service(s):   Place:   Date:   Time:     Natalie [facility], [facility] Comprehensive Clinical Assessment (CCA) Screening, Triage and Referral Note  11/04/2023 [facility] [id number]  Chief Complaint:  Chief Complaint  Patient presents with   Suicide Attempt   Visit Diagnosis:   Patient Reported Information How did you hear about us ? Other (Comment) ([employer].)  What Is the Reason for Your Visit/Call Today? Pt attempted suicide by overdosing on medications. Pt denies, HI, hallucinations, self-injurious behaviors and access to weapons.  How Long Has This Been Causing You Problems? <Week  What Do You Feel Would Help You the Most Today? Treatment for Depression or other mood problem; Stress Management   Have You  Recently Had Any Thoughts About Hurting Yourself? Yes  Are You Planning to Commit Suicide/Harm Yourself At This time? Yes   Have you Recently Had Thoughts About Hurting Someone Sherral? No  Are You Planning to Harm Someone at This Time? No  Explanation: NA   Have You Used Any Alcohol or Drugs in the Past 24 Hours? No  How Long Ago Did You Use Drugs or Alcohol? Pt denies.  What Did You Use and How Much? Pt denies.   Do You Currently Have a Therapist/Psychiatrist? Yes  Name of Therapist/Psychiatrist: Pt is unsure the name of the person who prescribes her medications.   Have You Been Recently Discharged From Any Office Practice or Programs? No  Explanation of Discharge From Practice/Program: NA   CCA Screening Triage Referral Assessment Type of Contact: Tele-Assessment  Telemedicine Service Delivery: Telemedicine service delivery: This service was  provided via telemedicine using a 2-way, interactive audio and video technology  Is this Initial or Reassessment? Is this Initial or Reassessment?: Initial Assessment  Date Telepsych consult ordered in CHL:  Date Telepsych consult ordered in CHL: 11/04/23  Time Telepsych consult ordered in CHL:  Time Telepsych consult ordered in CHL: 0106  [facility]  Provider Location: [facility]    Collateral Involvement: NA   Does Patient Have a Automotive engineer Guardian? No. Name and Contact of Legal Guardian: NA If Minor and Not Living with Parent(s), Who has Custody? NA  Is CPS involved or ever been involved? -- (NA)  Is APS involved or ever been involved? -- (NA)   Patient Determined To Be At Risk for Harm To Self or Others Based on Review of Patient Reported Information or Presenting Complaint? -- (NA)  Method: Plan with intent and identified person  Availability of Means: In hand or used  Intent: Clearly intends on inflicting harm that could cause  death  Notification Required: No need or identified person  Additional Information for Danger to Others Potential: -- (NA)  Additional Comments for Danger to Others Potential: NA  Are There Guns or Other Weapons in Your Home? No  Types of Guns/Weapons: Pt denies.  Are These Weapons Safely Secured?                            -- (NA)  Who Could Verify You Are Able To Have These Secured: NA  Do You Have any Outstanding Charges, Pending Court Dates, Parole/Probation? Pt denies.  Contacted To Inform of Risk of Harm To Self or Others: Other: Comment (NA)   Does Patient Present under Involuntary Commitment? No    [place] of Residence: [place]   Patient Currently Receiving the Following [facility]: Medication Management   Determination of Need: Emergent (2 hours)   Options For Referral: Inpatient Hospitalization; Medication Management; Outpatient Therapy; [facility]   Disposition Recommendation per psychiatric provider: We recommend inpatient psychiatric hospitalization when medically cleared. Patient is under voluntary admission status at this time; please IVC if attempts to leave Edwards.  Natalie [facility], [facility]     Natalie JONETTA Broach, MS, [facility], [facility] Triage Specialist [phone]

## 2023-11-04 NOTE — Plan of Care (Signed)
   Problem: Education: Goal: Knowledge of [facility] Education information/materials will improve Outcome: Progressing   Problem: Education: Goal: Verbalization of understanding the information provided will improve Outcome: Progressing

## 2023-11-04 NOTE — Plan of Care (Signed)
  Problem: Education: Goal: Knowledge of [facility] Education information/materials will improve Outcome: Progressing Goal: Verbalization of understanding the information provided will improve Outcome: Progressing   Problem: Coping: Goal: Ability to verbalize frustrations and anger appropriately will improve Outcome: Progressing   

## 2023-11-04 NOTE — Group Note (Signed)
 LCSW Group Therapy Note  Group Date: 11/04/2023 Start Time: 1330 End Time: 1430   Type of Therapy and Topic:  Group Therapy - How To Cope with Nervousness about Discharge   Participation Level:  Did Not Attend   Description of Group This process group involved identification of patients' feelings about discharge. Some of them are scheduled to be discharged soon, while others are new admissions, but each of them was asked to share thoughts and feelings surrounding discharge from the hospital. One common theme was that they are excited at the prospect of going home, while another was that many of them are apprehensive about sharing why they were hospitalized. Patients were given the opportunity to discuss these feelings with their peers in preparation for discharge.  Therapeutic Goals  Patient will identify their overall feelings about pending discharge. Patient will think about how they might proactively address issues that they believe will once again arise once they get home (i.e. with parents). Patients will participate in discussion about having hope for change.   Summary of Patient Progress:   X  Therapeutic Modalities Cognitive Behavioral Therapy   Sherryle JINNY Margo, LCSW 11/04/2023  2:52 PM

## 2023-11-04 NOTE — ED Provider Notes (Signed)
 Emergency Medicine Observation Re-evaluation Note  Natalie Edwards is a 58 y.o. female, h/o HTN, Diverticulitis s/p recent colectomy, Renal mass, IDA from chronic blood loss, PUD/GERD, prediabetes, polysubstance use who presents to the ED with [employer] due to intentional overdose on temazepam  and hydroxyzine . She seen on rounds today.  Pt initially presented to the ED for complaints of Suicide Attempt Currently, the patient is resting in bed. She complains of post-operative pain in her abdomen, the same pain that she has had since the surgery on 8/12, not worse or different.   Physical Exam  BP (!) 152/79 (BP Location: Left Arm)   Pulse (!) 102   Temp 98 F (36.7 C)   Resp 20   SpO2 99%  Physical Exam General: Resting in bed watching TV Cardiac: Well-perfused Abdomen: Well-healing laparoscopic incisions to abdomen with staples in place, no surrounding erythema/induration/fluctuance. Diffuse mild TTP without peritonitis.  Lungs: No resp distress Psych: calm, cooperative  ED Course / MDM  EKG:   I have reviewed the labs performed to date as well as medications administered while in observation.  Recent changes in the last 24 hours include medically cleared overnight last night. Pending TTS evaluation.  Plan  Current plan is for TTS evaluation.    Natalie Sid SAILOR, MD 11/04/23 [phone]

## 2023-11-04 NOTE — Group Note (Signed)
 Date:  11/04/2023 Time:  5:33 PM  Group Topic/Focus:  Wellness Toolbox:   The focus of this group is to discuss various aspects of wellness, balancing those aspects and exploring ways to increase the ability to experience wellness.  Patients will create a wellness toolbox for use upon discharge.    Participation Level:  Active  Participation Quality:  Appropriate  Affect:  Appropriate  Cognitive:  Appropriate  Insight: Appropriate  Engagement in Group:  Engaged  Modes of Intervention:  Activity and Socialization  Additional Comments:    Deitra Caron [place] 11/04/2023, 5:33 PM

## 2023-11-04 NOTE — ED Provider Notes (Signed)
  1:04 AM Patient has been cleared by poison control.  Her labs are reassuring.  She is resting comfortably but awakes and answers questions appropriately.  Medically cleared.  Will get TTS evaluation.     Jarold Olam HERO, PA-C 11/04/23 0107    Bari Charmaine FALCON, MD 11/06/23 [phone]

## 2023-11-04 NOTE — Group Note (Signed)
 Date:  11/04/2023 Time:  10:13 PM  Group Topic/Focus:  Goals Group:   The focus of this group is to help patients establish daily goals to achieve during treatment and discuss how the patient can incorporate goal setting into their daily lives to aide in recovery.    Participation Level:  Active  Participation Quality:  Attentive  Affect:  Appropriate  Cognitive:  Appropriate  Insight: Appropriate  Engagement in Group:  Engaged  Modes of Intervention:  Discussion  Additional Comments:     Lacinda JINNY Simpers 11/04/2023, 10:13 PM

## 2023-11-04 NOTE — BH Assessment (Signed)
 Clinician messaged Marko Moats, RN and Benton Oats, RN: [place]. It's Trey with TTS. Is the pt able to engage in the assessment, if so the pt will need to be placed in a private room. Is the pt under IVC? Also is the pt medically cleared?   Clinician awaiting response.   Jackson JONETTA Broach, MS, [facility], [facility] Triage Specialist [phone]

## 2023-11-04 NOTE — ED Notes (Signed)
 TTS in process

## 2023-11-04 NOTE — Progress Notes (Signed)
 Patient ID: Natalie [facility], female   DOB: May 10, 1965, 58 y.o.   MRN: [id number]   Admissions note:  Consents signed, handbook detailing the patient's rights, responsibilities, and visitor guidelines provided. Skin/belongings search completed and patient oriented to unit. Patient stable at this time. Patient given the opportunity to express concerns and ask questions. Patient given toiletries. RN will continue to monitor.

## 2023-11-04 NOTE — ED Notes (Signed)
 Call from [place] from poison control. They states pt is at end of observation and she can be medically cleared.

## 2023-11-04 NOTE — Group Note (Unsigned)
 Date:  11/04/2023 Time:  3:23 PM  Group Topic/Focus:  Spirituality:   The focus of this group is to discuss how one's spirituality can aide in recovery.     Participation Level:  {BHH PARTICIPATION OZCZO:77735}  Participation Quality:  {BHH PARTICIPATION QUALITY:22265}  Affect:  {BHH AFFECT:22266}  Cognitive:  {BHH COGNITIVE:22267}  Insight: {BHH Insight2:20797}  Engagement in Group:  {BHH ENGAGEMENT IN HMNLE:77731}  Modes of Intervention:  {BHH MODES OF INTERVENTION:22269}  Additional Comments:  ***  Dannie CHRISTELLA Hover 11/04/2023, 3:23 PM

## 2023-11-05 MED ORDER — FLUOXETINE HCL 20 MG PO CAPS: 20.0000 mg | ORAL_CAPSULE | Freq: Every day | ORAL | Status: DC

## 2023-11-05 MED ORDER — FLUOXETINE HCL 20 MG PO CAPS
40.0000 mg | ORAL_CAPSULE | Freq: Every day | ORAL | Status: DC
  Administered 2023-11-05 – 2023-11-10 (×6): 40 mg via ORAL
  Filled 2023-11-05 (×6): qty 2

## 2023-11-05 MED ORDER — TRAZODONE HCL 100 MG PO TABS
100.0000 mg | ORAL_TABLET | Freq: Every day | ORAL | Status: DC
  Administered 2023-11-05 – 2023-11-09 (×5): 100 mg via ORAL
  Filled 2023-11-05 (×5): qty 1

## 2023-11-05 NOTE — Progress Notes (Signed)
   11/05/23 1230  Psych Admission Type (Psych Patients Only)  Admission Status Voluntary  Psychosocial Assessment  Patient Complaints Anxiety;Depression;Sleep disturbance (patient states she wasn't given her pain medication, which disturbed her sleep; depression is because she needs to be out of the hospital for appointments; anxiety is off the chain, I don't know what triggers my anxiety, Prozac  doesn't work.)  Biomedical scientist  Facial Expression Worried  Affect Preoccupied (preoccupied with pain medication and outside appointments made prior to being admitted)  Speech Logical/coherent  Interaction Assertive  Motor Activity Slow  Appearance/Hygiene Layered clothes;In scrubs  Behavior Characteristics Cooperative;Appropriate to situation  Mood Preoccupied;Pleasant  Aggressive Behavior  Effect No apparent injury  Thought Process  Coherency WDL  Content WDL  Delusions None reported or observed  Perception WDL  Hallucination None reported or observed  Judgment WDL  Confusion Mild  Danger to Self  Current suicidal ideation? Denies  Agreement Not to Harm Self Yes  Description of Agreement Verbal  Danger to Others  Danger to Others None reported or observed

## 2023-11-05 NOTE — Group Note (Signed)
 Recreation Therapy Group Note   Group Topic:Health and Wellness  Group Date: 11/05/2023 Start Time: 1000 End Time: 1100 Facilitators: Celestia Jeoffrey BRAVO, LRT, CTRS Location: Courtyard  Group Description: [employer]. LRT and patients played games of basketball, drew with chalk, and played corn hole while outside in the courtyard while getting fresh air and sunlight. Music was being played in the background. LRT and peers conversed about different games they have played before, what they do in their free time and anything else that is on their minds. LRT encouraged pts to drink water after being outside, sweating and getting their heart rate up.  Goal Area(s) Addressed: Patient will build on frustration tolerance skills. Patients will partake in a competitive play game with peers. Patients will gain knowledge of new leisure interest/hobby.   Affect/Mood: Appropriate   Participation Level: Active and Engaged   Participation Quality: Independent   Behavior: Appropriate, Calm, and Cooperative   Speech/Thought Process: Coherent   Insight: Fair   Judgement: Fair    Modes of Intervention: Music, Open Conversation, Rapport Building, and Socialization   Patient Response to Interventions:  Attentive, Engaged, and Receptive   Education Outcome:  Acknowledges education   Clinical Observations/Individualized Feedback: [name] was active in their participation of session activities and group discussion. Pt interacted well with LRT and peers duration of session.    Plan: Continue to engage patient in RT group sessions 2-3x/week.   Jeoffrey BRAVO Celestia, LRT, CTRS 11/05/2023 2:05 PM

## 2023-11-05 NOTE — Group Note (Signed)
 [facility] LCSW Group Therapy Note   Group Date: 11/05/2023 Start Time: 1300 End Time: 1400   Type of Therapy/Topic:  Group Therapy:  Balance in Life  Participation Level:  None   Description of Group:    This group will address the concept of balance and how it feels and looks when one is unbalanced. Patients will be encouraged to process areas in their lives that are out of balance, and identify reasons for remaining unbalanced. Facilitators will guide patients utilizing problem- solving interventions to address and correct the stressor making their life unbalanced. Understanding and applying boundaries will be explored and addressed for obtaining  and maintaining a balanced life. Patients will be encouraged to explore ways to assertively make their unbalanced needs known to significant others in their lives, using other group members and facilitator for support and feedback.  Therapeutic Goals: Patient will identify two or more emotions or situations they have that consume much of in their lives. Patient will identify signs/triggers that life has become out of balance:  Patient will identify two ways to set boundaries in order to achieve balance in their lives:  Patient will demonstrate ability to communicate their needs through discussion and/or role plays  Summary of Patient Progress: Patient was present for the entirety of the group process. She participated in the icebreaker but not in the larger group discussion.    Therapeutic Modalities:   Cognitive Behavioral Therapy Solution-Focused Therapy Assertiveness Training   Nadara JONELLE Fam, LCSW

## 2023-11-05 NOTE — H&P (Signed)
 Psychiatric Admission Assessment Adult  Patient Identification: Natalie Edwards MRN:  [id number] Date of Evaluation:  11/05/2023 Chief Complaint:  MDD (major depressive disorder), recurrent severe, without psychosis (HCC) [F33.2]   History of Present Illness:  Patient is a 58 year old female admitted following intentional overdose attempt. She presented to the emergency department after being found at home by her son, who reported ingestion of an unknown type and amount of pills.  Presenting Problem Patient seen today for initial psychiatric evaluation. She reports worsening depressive symptoms following a recent surgery. She describes increasing social isolation, anxiety, hopelessness, and inability to manage ADLs independently. Patient notes constant need to be around others for reassurance. She acknowledges suicidal ideation in the setting of decompensated mood, culminating in her overdose attempt. History notable for long-term Prozac  use 20mg  with reported diminished effectiveness. Trazodone  100 mg previously prescribed, effective for mood but not for sleep. No documented history of mania, psychosis, or paranoia. She reports symptoms of depression have worsened since recent surgery related to pain and increased isolation. She reports prior to event she had thoughts to bring herself into for mental health care and did not. She reports son is supportive She denies SI/HI in this setting and there are no unsafe or self harm behaviors noted.   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 Hx: Denies Living Situation: Currently homeless Spiritual Hx: None reported Access to weapons/lethal means: denies    Substance History Alcohol: 1 bottle of wine per night sometimes Type of alcohol wine Last Drink the week before hospitalization Number of drinks per day as above History of alcohol withdrawal seizures denies History of DT's denies Tobacco: 1 pack/day Illicit drugs: Cocaine intermittently, 1 week before hospitalization, a small bag Prescription drug abuse: Denies Rehab hx: Denies  Total Time spent with patient: 1 hour Sleep  Sleep:No data recorded Is the patient at risk to self? Yes.    Has the patient been a risk to self in the past 6 months? Yes.    Has the patient been a risk to self within the distant past? Yes.    Is the patient a risk to others? No.  Has the patient been a risk to others in the past 6 months? No.  Has the patient been a risk to others within the distant past? No.   [place] Scale:  Flowsheet Row ED from 11/03/2023 in [facility] Emergency Department at [facility] ED to Hosp-Admission (Discharged) from 10/22/2023 in [facility] 45M KIDNEY UNIT ED from 10/20/2023 in [facility] Emergency Department at [facility]  C-SSRS RISK CATEGORY High Risk No Risk No Risk     Past Medical History:  Past Medical History:  Diagnosis Date   Allergy    Anemia    Diverticulosis    Gall bladder stones 1993   Gastric ulcer    GERD (gastroesophageal reflux disease)    Hypertension    Renal mass     Past Surgical History:  Procedure Laterality Date   ABDOMINAL HYSTERECTOMY  BIOPSY  09/29/2022   Procedure: BIOPSY;  Surgeon: Shila Gustav GAILS, MD;  Location: [facility] ENDOSCOPY;  Service: Gastroenterology;;   CHOLECYSTECTOMY     COLONOSCOPY WITH ESOPHAGOGASTRODUODENOSCOPY (EGD)  04/07/2023   [facility]    ESOPHAGOGASTRODUODENOSCOPY (EGD) WITH PROPOFOL  N/A 09/29/2022   Procedure: ESOPHAGOGASTRODUODENOSCOPY (EGD) WITH PROPOFOL ;  Surgeon: Shila Gustav GAILS, MD;  Location: [facility] ENDOSCOPY;  Service: Gastroenterology;  Laterality: N/A;   LAPAROSCOPIC SIGMOID COLECTOMY N/A 10/27/2023   Procedure: COLECTOMY, SIGMOID, LAPAROSCOPIC;  Surgeon: Lyndel Deward PARAS, MD;  Location: [facility] OR;  Service: General;  Laterality: N/A;   MOBILIZATION, SPLENIC FLEXURE, WITH PARTIAL COLECTOMY N/A 10/27/2023   Procedure: MOBILIZATION, SPLENIC FLEXURE;  Surgeon: Stechschulte, Deward PARAS, MD;  Location: [facility] OR;  Service: General;  Laterality: N/A;   Family History:  Family History  Problem Relation Age of Onset   Cancer Maternal Uncle        Lung   Cancer Maternal Uncle        Lung   Cancer Maternal Uncle        Lung   Headache Neg Hx        I don't think so   Migraines Neg Hx        I don't think so    Social History:  Social History   Substance and Sexual Activity  Alcohol Use Yes   Comment: occasionally - last drink was July 4, 1 shot     Social History   Substance and Sexual Activity  Drug Use No      Allergies:   Allergies  Allergen Reactions   Hydroxyzine  Palpitations and Other (See Comments)    Jitteriness, too   Nsaids Other (See Comments)    History of ulcers   Ambien  [Zolpidem  Tartrate] Other (See Comments)    Jitteriness, nervousness, abdominal pain   Ibuprofen  Other (See Comments)    History of ulcers   Lab Results:  Results for orders placed or performed during the hospital encounter of 11/03/23 (from the past 48 hours)  CBC with Differential     Status: Abnormal   Collection Time: 11/03/23  7:30 PM  Result Value Ref Range   WBC 9.9 4.0 - 10.5 K/uL   RBC 3.64 (L) 3.87 - 5.11 MIL/uL   Hemoglobin 9.3 (L) 12.0 - 15.0 g/dL   HCT 70.6 (L) 63.9 - 53.9 %   MCV 80.5 80.0 - 100.0 fL   MCH 25.5 (L) 26.0 - 34.0 pg   MCHC 31.7 30.0 - 36.0 g/dL   RDW 83.0 (H) 88.4 - 84.4 %   Platelets 766  (H) 150 - 400 K/uL   nRBC 0.0 0.0 - 0.2 %   Neutrophils Relative % 61 %   Neutro Abs 5.9 1.7 - 7.7 K/uL   Lymphocytes Relative 25 %   Lymphs Abs 2.5 0.7 - 4.0 K/uL   Monocytes Relative 12 %   Monocytes Absolute 1.2 (H) 0.1 - 1.0 K/uL   Eosinophils Relative 2 %   Eosinophils Absolute 0.2 0.0 - 0.5 K/uL   Basophils Relative 0 %   Basophils Absolute 0.0 0.0 - 0.1 K/uL   Immature Granulocytes 0 %   Abs Immature Granulocytes 0.04 0.00 - 0.07 K/uL    Comment: Performed at [facility], [address] [place], [place] [address]  Comprehensive metabolic panel     Status: Abnormal   Collection Time: 11/03/23  7:30 PM  Result Value Ref Range   Sodium 139 135 - 145 mmol/L  Potassium 3.9 3.5 - 5.1 mmol/L    Comment: HEMOLYSIS AT THIS LEVEL MAY AFFECT RESULT   Chloride 103 98 - 111 mmol/L   CO2 22 22 - 32 mmol/L   Glucose, Bld 94 70 - 99 mg/dL    Comment: Glucose reference range applies only to samples taken after fasting for at least 8 hours.   BUN 9 6 - 20 mg/dL   Creatinine, Ser 9.09 0.44 - 1.00 mg/dL   Calcium  8.7 (L) 8.9 - 10.3 mg/dL   Total Protein 6.4 (L) 6.5 - 8.1 g/dL   Albumin  2.9 (L) 3.5 - 5.0 g/dL   AST 29 15 - 41 U/L    Comment: HEMOLYSIS AT THIS LEVEL MAY AFFECT RESULT   ALT 20 0 - 44 U/L    Comment: HEMOLYSIS AT THIS LEVEL MAY AFFECT RESULT   Alkaline Phosphatase 56 38 - 126 U/L   Total Bilirubin 1.2 0.0 - 1.2 mg/dL    Comment: HEMOLYSIS AT THIS LEVEL MAY AFFECT RESULT   GFR, Estimated >60 >60 mL/min    Comment: (NOTE) Calculated using the CKD-EPI Creatinine Equation (2021)    Anion gap 14 5 - 15    Comment: Performed at [facility], [address], [place], [place] [address]  Rapid urine drug screen (hospital performed)     Status: Abnormal   Collection Time: 11/03/23  7:30 PM  Result Value Ref Range   Opiates NONE DETECTED NONE DETECTED   Cocaine NONE DETECTED NONE DETECTED   Benzodiazepines POSITIVE (A) NONE DETECTED   Amphetamines NONE DETECTED  NONE DETECTED   Tetrahydrocannabinol NONE DETECTED NONE DETECTED   Barbiturates NONE DETECTED NONE DETECTED    Comment: (NOTE) DRUG SCREEN FOR MEDICAL PURPOSES ONLY.  IF CONFIRMATION IS NEEDED FOR ANY PURPOSE, NOTIFY LAB WITHIN 5 DAYS.  LOWEST DETECTABLE LIMITS FOR URINE DRUG SCREEN Drug Class                     Cutoff (ng/mL) Amphetamine and metabolites    1000 Barbiturate and metabolites    200 Benzodiazepine                 200 Opiates and metabolites        300 Cocaine and metabolites        300 THC                            50 Performed at [facility], [address] [place], [place] [address]   Salicylate level     Status: Abnormal   Collection Time: 11/03/23  7:30 PM  Result Value Ref Range   Salicylate Lvl <7.0 (L) 7.0 - 30.0 mg/dL    Comment: Performed at [facility], [address], [place], [place] [address]  Acetaminophen  level     Status: Abnormal   Collection Time: 11/03/23  7:30 PM  Result Value Ref Range   Acetaminophen  (Tylenol ), Serum <10 (L) 10 - 30 ug/mL    Comment: (NOTE) Therapeutic concentrations vary significantly. A range of 10-30 ug/mL  may be an effective concentration for many patients. However, some  are best treated at concentrations outside of this range. Acetaminophen  concentrations >150 ug/mL at 4 hours after ingestion  and >50 ug/mL at 12 hours after ingestion are often associated with  toxic reactions.  Performed at [facility], [address] [place], [place] [address]   Ethanol  Status: None   Collection Time: 11/03/23  7:30 PM  Result Value Ref Range   Alcohol, Ethyl (B) <15 <15 mg/dL    Comment: (NOTE) For medical purposes only. Performed at [facility], [address] [place], [place] [address]   CBG monitoring, ED     Status: Abnormal   Collection Time: 11/03/23  7:57 PM  Result Value Ref Range   Glucose-Capillary 102 (H) 70 - 99 mg/dL    Comment: Glucose reference range applies only to  samples taken after fasting for at least 8 hours.  Acetaminophen  level     Status: Abnormal   Collection Time: 11/03/23 11:26 PM  Result Value Ref Range   Acetaminophen  (Tylenol ), Serum <10 (L) 10 - 30 ug/mL    Comment: (NOTE) Therapeutic concentrations vary significantly. A range of 10-30 ug/mL  may be an effective concentration for many patients. However, some  are best treated at concentrations outside of this range. Acetaminophen  concentrations >150 ug/mL at 4 hours after ingestion  and >50 ug/mL at 12 hours after ingestion are often associated with  toxic reactions.  Performed at [facility], [address], [place], [place] [address]     Blood Alcohol level:  Lab Results  Component Value Date   [facility] <15 11/03/2023   ETH <15 10/07/2023    Metabolic Disorder Labs:  Lab Results  Component Value Date   HGBA1C 5.2 05/22/2023   MPG 102.54 05/22/2023   No results found for: PROLACTIN Lab Results  Component Value Date   CHOL 183 05/29/2023   TRIG 87 05/29/2023   HDL 63 05/29/2023   CHOLHDL 2.9 05/29/2023   VLDL 17 05/29/2023   LDLCALC 103 (H) 05/29/2023   LDLCALC 106 (H) 12/13/2021    Current Medications: Current Facility-Administered Medications  Medication Dose Route Frequency Provider Last Rate Last Admin   acetaminophen  (TYLENOL ) tablet 650 mg  650 mg Oral Q6H PRN Mannie Jerel PARAS, NP       alum & mag hydroxide-simeth (MAALOX/MYLANTA) 200-200-20 MG/5ML suspension 30 mL  30 mL Oral Q4H PRN Mannie Jerel PARAS, NP       haloperidol  (HALDOL ) tablet 5 mg  5 mg Oral TID PRN Mannie Jerel PARAS, NP       And   diphenhydrAMINE  (BENADRYL ) capsule 50 mg  50 mg Oral TID PRN Mannie Jerel PARAS, NP       haloperidol  lactate (HALDOL ) injection 5 mg  5 mg Intramuscular TID PRN Mannie Jerel PARAS, NP       And   diphenhydrAMINE  (BENADRYL ) injection 50 mg  50 mg Intramuscular TID PRN Mannie Jerel PARAS, NP       And   LORazepam  (ATIVAN ) injection 2 mg  2 mg Intramuscular TID PRN  Mannie Jerel PARAS, NP       haloperidol  lactate (HALDOL ) injection 10 mg  10 mg Intramuscular TID PRN Mannie Jerel PARAS, NP       And   diphenhydrAMINE  (BENADRYL ) injection 50 mg  50 mg Intramuscular TID PRN Mannie Jerel PARAS, NP       And   LORazepam  (ATIVAN ) injection 2 mg  2 mg Intramuscular TID PRN Mannie Jerel PARAS, NP       magnesium  hydroxide (MILK OF MAGNESIA) suspension 30 mL  30 mL Oral Daily PRN Mannie Jerel PARAS, NP       methocarbamol  (ROBAXIN ) tablet 750 mg  750 mg Oral Q8H PRN Mannie Jerel PARAS, NP   750 mg at 11/05/23 1206  nicotine  (NICODERM CQ  - dosed in mg/24 hours) patch 14 mg  14 mg Transdermal Q0600 Mannie Jerel PARAS, NP   14 mg at 11/05/23 9261   oxyCODONE  (Oxy IR/ROXICODONE ) immediate release tablet 5 mg  5 mg Oral Q4H PRN Mannie Jerel PARAS, NP   5 mg at 11/05/23 1206   traZODone  (DESYREL ) tablet 50 mg  50 mg Oral QHS PRN Cleotilde Hoy HERO, NP   50 mg at 11/04/23 2042   PTA Medications: Medications Prior to Admission  Medication Sig Dispense Refill Last Dose/Taking   acetaminophen  (TYLENOL ) 500 MG tablet Take 2 tablets (1,000 mg total) by mouth every 6 (six) hours as needed.      busPIRone  (BUSPAR ) 7.5 MG tablet Take 7.5 mg by mouth 2 (two) times daily. (Patient not taking: Reported on 10/23/2023)      dicyclomine  (BENTYL ) 20 MG tablet Take 1 tablet (20 mg total) by mouth 2 (two) times daily. (Patient not taking: Reported on 10/23/2023) 20 tablet 0    FLUoxetine  (PROZAC ) 20 MG capsule Take 20 mg by mouth daily. (Patient not taking: Reported on 10/23/2023)      hydrOXYzine  (ATARAX ) 50 MG tablet Take 50 mg by mouth at bedtime. (Patient not taking: Reported on 11/03/2023)      methocarbamol  (ROBAXIN ) 750 MG tablet Take 1 tablet (750 mg total) by mouth every 8 (eight) hours as needed for muscle spasms. 60 tablet 0    mirtazapine  (REMERON ) 45 MG tablet Take 45 mg by mouth at bedtime. (Patient not taking: Reported on 10/23/2023)      nicotine  (NICODERM CQ  - DOSED IN MG/24 HOURS) 21 mg/24hr  patch Place 1 patch (21 mg total) onto the skin daily. (Patient not taking: Reported on 10/23/2023) 28 patch 0    ondansetron  (ZOFRAN -ODT) 4 MG disintegrating tablet Take 4 mg by mouth every 8 (eight) hours as needed.      oxyCODONE  (OXY IR/ROXICODONE ) 5 MG immediate release tablet Take 1-2 tablets (5-10 mg total) by mouth every 4 (four) hours as needed for moderate pain (pain score 4-6) or severe pain (pain score 7-10). 30 tablet 0    pantoprazole  (PROTONIX ) 40 MG tablet Take 1 tablet (40 mg total) by mouth 2 (two) times daily. (Patient not taking: Reported on 10/23/2023) 30 tablet 2    pregabalin  (LYRICA ) 75 MG capsule Take 1 capsule (75 mg total) by mouth 2 (two) times daily. (Patient not taking: Reported on 11/03/2023) 60 capsule 0    propranolol  (INDERAL ) 10 MG tablet Take 10 mg by mouth daily. (Patient not taking: Reported on 11/03/2023)      risperiDONE  (RISPERDAL ) 1 MG tablet Take 1 mg by mouth 2 (two) times daily. (Patient not taking: Reported on 10/23/2023)      temazepam  (RESTORIL ) 15 MG capsule Take 15 mg by mouth at bedtime as needed. (Patient not taking: Reported on 10/23/2023)       Psychiatric Specialty Exam: Appearance: dressed in paper scrubs, disheveled, fair to poor hygiene Eye Contact: fair Psychomotor Activity: within normal limits Speech: normal rate and volume Mood: dysphoric Affect: congruent Thought Process: linear, goal-directed Thought Content: denies current hallucinations, paranoia, or delusions; endorses suicidal ideation Perception: no AVH or paranoia observed Orientation: fully oriented Memory: intact Concentration: adequate Language: intact Insight: fair Judgment: poor  Psychomotor Activity  Psychomotor Activity:unremarkable   Assets  Assets: Communication Skills; Desire for Improvement; Physical Health    Musculoskeletal: Strength & Muscle Tone: within normal limits Gait & Station: normal  Physical Exam: Physical Exam Constitutional:  Appearance: Normal appearance.  HENT:     Head: Normocephalic and atraumatic.  Neurological:     Mental Status: She is alert.    ROS Blood pressure (!) 142/57, pulse 84, temperature 98 F (36.7 C), resp. rate 19, height 5' 4 (1.626 m), weight 84.6 kg, SpO2 95%. Body mass index is 32.01 kg/m.  Principal Diagnosis: <principal problem not specified> Diagnosis:  Active Problems:   MDD (major depressive disorder), recurrent severe, without psychosis (HCC)     Safety and Monitoring:                         -- Daily contact with patient to assess and evaluate symptoms and progress in treatment             -- Patient's case to be discussed in multi-disciplinary team meeting             -- Observation Level: q15 minute checks             -- Vital signs:  q12 hours             -- Precautions: suicide, elopement, and assault   2. Psychiatric Diagnoses and Treatment: MDD recurrent severe without psychotic features   Patient is a 58 year old female presenting after intentional overdose in the setting of worsening depression, hopelessness, and poor response to long-term SSRI therapy. She demonstrates decompensated mood, impaired ADLs, social isolation, and suicidal ideation. No evidence of mania or psychosis is reported. Given intentional overdose and persistent dysphoria, she is at elevated risk and requires acute psychiatric intervention. This is patient's third hospitalization for similar.   It is the judgment of the undersigned that patient is appropriate for treatment in this acute psychiatric setting related to concern for safety in the context of worsening depression and intentional overdose.  Medication  Increase Prozac  40mg  po daily Begin Trazodone  100mg  po QHS                  -- The risks/benefits/side-effects/alternatives to this medication were discussed in detail with the patient and time was given for questions. The patient consents to medication trial.                --  Metabolic profile and EKG monitoring obtained while on an atypical antipsychotic (BMI: Lipid Panel: HbgA1c: QTc:)              -- Encouraged patient to participate in unit milieu and in scheduled group therapies         4. Discharge Planning:              -- Social work and case management to assist with discharge planning and identification of hospital follow-up needs prior to discharge             -- Estimated LOS: 5-7 days             -- Discharge Concerns: Need to establish a safety plan; Medication compliance and effectiveness             -- Discharge Goals: Return home with outpatient referrals follow ups  Physician Treatment Plan for Primary Diagnosis: <principal problem not specified> Long Term Goal(s): Improvement in symptoms so as ready for discharge  Short Term Goals: Ability to identify changes in lifestyle to reduce recurrence of condition will improve  Physician Treatment Plan for Secondary Diagnosis: Active Problems:   MDD (major depressive disorder), recurrent severe, without psychosis (HCC)  Long Term Goal(s):  Improvement in symptoms so as ready for discharge  Short Term Goals: Ability to identify changes in lifestyle to reduce recurrence of condition will improve  I certify that inpatient services furnished can reasonably be expected to improve the patient's condition.    Hoy CHRISTELLA Pinal, NP 8/21/20251:37 PM

## 2023-11-05 NOTE — Plan of Care (Signed)

## 2023-11-05 NOTE — Group Note (Signed)
 Date:  11/05/2023 Time:  10:13 AM  Group Topic/Focus:  Goals Group:   The focus of this group is to help patients establish daily goals to achieve during treatment and discuss how the patient can incorporate goal setting into their daily lives to aide in recovery.    Participation Level:  Active  Participation Quality:  Appropriate  Affect:  Appropriate  Cognitive:  Appropriate  Insight: Appropriate  Engagement in Group:  Engaged  Modes of Intervention:  Discussion, Education, and Support  Additional Comments:    Deitra Caron Mainland 11/05/2023, 10:13 AM

## 2023-11-05 NOTE — Group Note (Signed)
 Date:  11/05/2023 Time:  8:54 PM  Group Topic/Focus:  Identifying Needs:   The focus of this group is to help patients identify their personal needs that have been historically problematic and identify healthy behaviors to address their needs.    Participation Level:  Did Not Attend  Participation Quality:  Did Not Attend  Affect:  Did Not Attend  Cognitive:  Did Not Attend  Insight: None  Engagement in Group:  None  Modes of Intervention:  Exploration  Additional Comments:    Natalie Edwards 11/05/2023, 8:54 PM

## 2023-11-06 ENCOUNTER — Ambulatory Visit: Admitting: Family Medicine

## 2023-11-06 MED ORDER — ENSURE PLUS HIGH PROTEIN PO LIQD
237.0000 mL | Freq: Two times a day (BID) | ORAL | Status: DC
  Administered 2023-11-08 – 2023-11-09 (×2): 237 mL via ORAL

## 2023-11-06 NOTE — Group Note (Signed)
 Date:  11/06/2023 Time:  5:32 PM  Group Topic/Focus:  Wellness Toolbox:   The focus of this group is to discuss various aspects of wellness, balancing those aspects and exploring ways to increase the ability to experience wellness.  Patients will create a wellness toolbox for use upon discharge.    Participation Level:  Active  Participation Quality:  Appropriate  Affect:  Appropriate  Cognitive:  Appropriate  Insight: Appropriate  Engagement in Group:  Engaged  Modes of Intervention:  Activity and Socialization  Additional Comments:    Deitra Caron Mainland 11/06/2023, 5:32 PM

## 2023-11-06 NOTE — Progress Notes (Signed)
 Pt sitting up in bed head resting in hand, preoccupied in thoughts, affect flat and mood depressed.  Withdrawn with no peer engagement - in room for the most part of the shift.  Continues to endorsed depression and anxiety both rated moderately severe; she denied SI/HI and AVH.  Took scheduled bedtime medications including oxycodone  for abdominal pain.    11/05/23 2200  Psych Admission Type (Psych Patients Only)  Admission Status Voluntary  Psychosocial Assessment  Patient Complaints Anxiety;Depression  Eye Contact Fair  Facial Expression Flat  Affect Preoccupied  Speech Logical/coherent  Interaction Assertive  Motor Activity Slow  Appearance/Hygiene Layered clothes  Behavior Characteristics Cooperative;Appropriate to situation  Mood Pleasant  Aggressive Behavior  Effect No apparent injury  Thought Process  Coherency WDL  Content WDL  Delusions WDL  Perception WDL  Hallucination None reported or observed  Judgment WDL  Confusion WDL  Danger to Self  Current suicidal ideation? Denies  Agreement Not to Harm Self Yes  Description of Agreement verbal  Danger to Others  Danger to Others None reported or observed   Problem: Activity: Goal: Interest or engagement in activities will improve Outcome: Progressing Goal: Sleeping patterns will improve Outcome: Progressing   Problem: Education: Goal: Knowledge of [facility] Education information/materials will improve Outcome: Progressing Goal: Emotional status will improve Outcome: Progressing Goal: Mental status will improve Outcome: Progressing Goal: Verbalization of understanding the information provided will improve Outcome: Progressing   Problem: Coping: Goal: Ability to verbalize frustrations and anger appropriately will improve Outcome: Progressing Goal: Ability to demonstrate self-control will improve Outcome: Progressing

## 2023-11-06 NOTE — [facility] Suicide Risk Assessment (Signed)
 [facility] INPATIENT:  Family/Significant Other Suicide Prevention Education  Suicide Prevention Education:  Patient Refusal for Family/Significant Other Suicide Prevention Education: The patient Natalie Edwards has refused to provide written consent for family/significant other to be provided Family/Significant Other Suicide Prevention Education during admission and/or prior to discharge.  Physician notified.  SPE completed with pt, as pt refused to consent to family contact. SPI pamphlet provided to pt and pt was encouraged to share information with support network, ask questions, and talk about any concerns relating to SPE. Pt denies access to guns/firearms and verbalized understanding of information provided. Mobile Crisis information also provided to pt.   Sherryle JINNY Margo 11/06/2023, 1:00 PM

## 2023-11-06 NOTE — H&P (Signed)
 [facility] MD Progress Note  11/06/2023 3:41 PM Natalie Edwards  MRN:  [id number]  Patient is a 58 year old female admitted following intentional overdose attempt. She presented to the emergency department after being found at home by her son, who reported ingestion of an unknown type and amount of pills.  Reportedly, symptoms of depression have worsened since recent surgery related to pain and increased isolation. She reports prior to event she had thoughts to bring herself into for mental health care and did not. She reports son is supportive   Subjective:  Chart reviewed, case discussed in multidisciplinary meeting, patient seen during rounds.   Patient seen today for follow-up psychiatric evaluation. Upon approach, she is agreeable to interview but expresses distress and requests discharge, stating she feels better. She acknowledges persistent depressive symptoms as reported yesterday and agrees that stabilization is still needed. Patient reports poor appetite at home, describing her diet as limited, stating, "I eat a lot of jello." She denies suicidal ideation, homicidal ideation, hallucinations, paranoia, or delusions today. She notes her goal is "to make myself better." She reports sleeping well with trazodone . No medication side effects reported.    Past Psychiatric History: see h&P Family History:  Family History  Problem Relation Age of Onset   Cancer Maternal Uncle        Lung   Cancer Maternal Uncle        Lung   Cancer Maternal Uncle        Lung   Headache Neg Hx        I don't think so   Migraines Neg Hx        I don't think so   Social History:  Social History   Substance and Sexual Activity  Alcohol Use Yes   Comment: occasionally - last drink was July 4, 1 shot     Social History   Substance and Sexual Activity  Drug Use No    Social History   Socioeconomic History   Marital status: Single    Spouse name: Not on file   Number of children: 3   Years of  education: Not on file   Highest education level: 12th grade  Occupational History   Occupation: environmental services at [employer]  Tobacco Use   Smoking status: Every Day    Current packs/day: 0.50    Average packs/day: 0.5 packs/day for 25.0 years (12.5 ttl pk-yrs)    Types: Cigarettes   Smokeless tobacco: Never   Tobacco comments:    Pt tried to quit - helps with anxiety     1 pack last patient 3 days   Vaping Use   Vaping status: Never Used  Substance and Sexual Activity   Alcohol use: Yes    Comment: occasionally - last drink was July 4, 1 shot   Drug use: No   Sexual activity: Yes    Comment: hysterectomy  Other Topics Concern   Not on file  Social History Narrative   Lives at home in an apartment. Her mother lives with her.    Right handed   Caffeine: drinks approx. 36 oz of pepsi per day. Sometimes drinks 2 cups of coffee in a day as well.    Social Drivers of Health   Financial Resource Strain: Low Risk  (03/06/2023)   Overall Financial Resource Strain (CARDIA)    Difficulty of Paying Living Expenses: Not very hard  Food Insecurity: Food Insecurity Present (11/04/2023)   Hunger Vital Sign    Worried About Running  Out of Food in the Last Year: Never true    Ran Out of Food in the Last Year: Sometimes true  Transportation Needs: Unmet Transportation Needs (11/04/2023)   PRAPARE - Transportation    Lack of Transportation (Medical): No    Lack of Transportation (Non-Medical): Yes  Physical Activity: Sufficiently Active (05/22/2022)   Exercise Vital Sign    Days of Exercise per Week: 4 days    Minutes of Exercise per Session: 70 min  Stress: Stress Concern Present (03/06/2023)   [employer] of Occupational Health - Occupational Stress Questionnaire    Feeling of Stress : Very much  Social Connections: Socially Isolated (11/04/2023)   Social Connection and Isolation Panel    Frequency of Communication with Friends and Family: Never    Frequency of Social  Gatherings with Friends and Family: Never    Attends Religious Services: Never    Database administrator or Organizations: No    Attends Engineer, structural: Never    Marital Status: Divorced   Past Medical History:  Past Medical History:  Diagnosis Date   Allergy    Anemia    Diverticulosis    Gall bladder stones 1993   Gastric ulcer    GERD (gastroesophageal reflux disease)    Hypertension    Renal mass     Past Surgical History:  Procedure Laterality Date   ABDOMINAL HYSTERECTOMY     BIOPSY  09/29/2022   Procedure: BIOPSY;  Surgeon: Shila Gustav GAILS, MD;  Location: [facility];  Service: Gastroenterology;;   CHOLECYSTECTOMY     COLONOSCOPY WITH ESOPHAGOGASTRODUODENOSCOPY (EGD)  04/07/2023   [facility]   ESOPHAGOGASTRODUODENOSCOPY (EGD) WITH PROPOFOL  N/A 09/29/2022   Procedure: ESOPHAGOGASTRODUODENOSCOPY (EGD) WITH PROPOFOL ;  Surgeon: Shila Gustav GAILS, MD;  Location: [facility] END[facility]COPY;  Service: Gastroenterology;  Laterality: N/A;   LAPAROSCOPIC SIGMOID COLECTOMY N/A 10/27/2023   Procedure: COLECTOMY, SIGMOID, LAPAROSCOPIC;  Surgeon: Lyndel Deward PARAS, MD;  Location: [facility] OR;  Service: General;  Laterality: N/A;   MOBILIZATION, SPLENIC FLEXURE, WITH PARTIAL COLECTOMY N/A 10/27/2023   Procedure: MOBILIZATION, SPLENIC FLEXURE;  Surgeon: Stechschulte, Deward PARAS, MD;  Location: [facility] OR;  Service: General;  Laterality: N/A;    Current Medications: Current Facility-Administered Medications  Medication Dose Route Frequency Provider Last Rate Last Admin   acetaminophen  (TYLENOL ) tablet 650 mg  650 mg Oral Q6H PRN Mannie Jerel PARAS, NP   650 mg at 11/06/23 1033   alum & mag hydroxide-simeth (MAALOX/MYLANTA) 200-200-20 MG/5ML suspension 30 mL  30 mL Oral Q4H PRN Mannie Jerel PARAS, NP       haloperidol  (HALDOL ) tablet 5 mg  5 mg Oral TID PRN Mannie Jerel PARAS, NP       And   diphenhydrAMINE  (BENADRYL ) capsule 50 mg  50 mg Oral TID PRN Mannie Jerel PARAS, NP       haloperidol   lactate (HALDOL ) injection 5 mg  5 mg Intramuscular TID PRN Mannie Jerel PARAS, NP       And   diphenhydrAMINE  (BENADRYL ) injection 50 mg  50 mg Intramuscular TID PRN Mannie Jerel PARAS, NP       And   LORazepam  (ATIVAN ) injection 2 mg  2 mg Intramuscular TID PRN Mannie Jerel PARAS, NP       haloperidol  lactate (HALDOL ) injection 10 mg  10 mg Intramuscular TID PRN Mannie Jerel PARAS, NP       And   diphenhydrAMINE  (BENADRYL ) injection 50 mg  50 mg Intramuscular TID PRN Mannie Jerel PARAS,  NP       And   LORazepam  (ATIVAN ) injection 2 mg  2 mg Intramuscular TID PRN Mannie Jerel PARAS, NP       FLUoxetine  (PROZAC ) capsule 40 mg  40 mg Oral Daily Cleotilde Hoy HERO, NP   40 mg at 11/06/23 9187   magnesium  hydroxide (MILK OF MAGNESIA) suspension 30 mL  30 mL Oral Daily PRN Mannie Jerel PARAS, NP       methocarbamol  (ROBAXIN ) tablet 750 mg  750 mg Oral Q8H PRN Mannie Jerel PARAS, NP   750 mg at 11/05/23 1206   nicotine  (NICODERM CQ  - dosed in mg/24 hours) patch 14 mg  14 mg Transdermal Q0600 Mannie Jerel PARAS, NP   14 mg at 11/06/23 9367   oxyCODONE  (Oxy IR/ROXICODONE ) immediate release tablet 5 mg  5 mg Oral Q4H PRN Mannie Jerel PARAS, NP   5 mg at 11/06/23 1159   traZODone  (DESYREL ) tablet 100 mg  100 mg Oral QHS Cleotilde Hoy HERO, NP   100 mg at 11/05/23 2103    Lab Results: No results found for this or any previous visit (from the past 48 hours).  Blood Alcohol level:  Lab Results  Component Value Date   [facility] <15 11/03/2023   ETH <15 10/07/2023    Metabolic Disorder Labs: Lab Results  Component Value Date   HGBA1C 5.2 05/22/2023   MPG 102.54 05/22/2023   No results found for: PROLACTIN Lab Results  Component Value Date   CHOL 183 05/29/2023   TRIG 87 05/29/2023   HDL 63 05/29/2023   CHOLHDL 2.9 05/29/2023   VLDL 17 05/29/2023   LDLCALC 103 (H) 05/29/2023   LDLCALC 106 (H) 12/13/2021      Psychiatric Specialty Exam: Appearance: dressed in paper scrubs, disheveled, fair to poor  hygiene Eye contact: avoidant, fearful Psychomotor activity: normal range Speech: soft, low volume Mood: dysphoric Affect: flat, congruent Thought process: linear, goal-directed Thought content: denies SI/HI, no psychosis observed Perception: no AVH or paranoia Orientation: fully oriented Memory: intact Concentration: fair Recall: intact Fund of knowledge: appropriate Language: intact Insight: fair Judgment: poor  Musculoskeletal: Strength & Muscle Tone: within normal limits Gait & Station: normal Assets  Assets: Manufacturing systems engineer; Desire for Improvement; Physical Health    Physical Exam: Physical Exam ROS Blood pressure (!) 143/73, pulse 93, temperature 97.6 F (36.4 C), resp. rate 20, height 5' 4 (1.626 m), weight 84.6 kg, SpO2 93%. Body mass index is 32.01 kg/m.  Diagnosis: Active Problems:   MDD (major depressive disorder), recurrent severe, without psychosis (HCC)   PLAN: Safety and Monitoring:    -- Daily contact with patient to assess and evaluate symptoms and progress in treatment  -- Patient's case to be discussed in multi-disciplinary team meeting  -- Observation Level : q15 minute checks  -- Vital signs:  q12 hours  -- Precautions: suicide, elopement, and assault -- Encouraged patient to participate in unit milieu and in scheduled group therapies   2. Psychiatric Diagnoses and Treatment:  Patient presentation is meeting diagnostic criteria for major depressive disorder, recurrent, severe, without psychotic features, based on dysphoric mood, poor appetite, hopelessness, functional decline, and history of suicidal ideation and behaviors. While she requests discharge, symptoms remain significant and stabilization is required prior to safe transition. Patient continues to require this acute inpatient psychiatric setting for safety, monitoring, and medication management.  Psychiatric Plan / Interventions No changes to plan of care today. Continue current  regimen, including Prozac  and trazodone . Monitor mood, safety, and  appetite. Continue to reinforce coping skills and encourage engagement in treatment. Nursing reports staples intact with no concerns for infection; will continue to monitor surgical recovery. Medication education provided to include risks, benefits, and side effects. Patient verbalized understanding and agrees to plan of care.  Psychiatric Medications Prozac  40 mg daily Trazodone  100 mg nightly       3. Medical Issues Being Addressed: ordering nutrition consult, add Ensure bid     4. Discharge Planning:   -- Social work and case management to assist with discharge planning and identification of hospital follow-up needs prior to discharge  -- Estimated LOS: 3-4 days  Hoy CHRISTELLA Pinal, NP 11/06/2023, 3:41 PM

## 2023-11-06 NOTE — Group Note (Signed)
 Date:  11/06/2023 Time:  8:35 PM  Group Topic/Focus:  Building Self Esteem:   The Focus of this group is helping patients become aware of the effects of self-esteem on their lives, the things they and others do that enhance or undermine their self-esteem, seeing the relationship between their level of self-esteem and the choices they make and learning ways to enhance self-esteem.    Participation Level:  Did Not Attend   [name] Malcom Selmer 11/06/2023, 8:35 PM

## 2023-11-06 NOTE — Progress Notes (Signed)
   11/06/23 1932  Psych Admission Type (Psych Patients Only)  Admission Status Voluntary  Psychosocial Assessment  Patient Complaints Anxiety;Depression;Appetite decrease  Eye Contact Fair  Facial Expression Flat  Affect Flat  Speech Logical/coherent;Soft  Interaction Assertive  Motor Activity Slow  Appearance/Hygiene Poor hygiene;Body odor  Behavior Characteristics Cooperative;Calm  Mood Pleasant  Aggressive Behavior  Effect No apparent injury  Thought Process  Coherency WDL  Content WDL  Delusions None reported or observed  Perception WDL  Hallucination None reported or observed  Judgment Poor  Confusion None  Danger to Self  Current suicidal ideation? Denies  Agreement Not to Harm Self Yes  Description of Agreement Verbal  Danger to Others  Danger to Others None reported or observed

## 2023-11-06 NOTE — Plan of Care (Signed)

## 2023-11-06 NOTE — [facility] Group Notes (Signed)
 Spirituality Group   Group Goal: Support / Education around grief and loss    Group Description: Following introductions and group rules, group members engaged in facilitated group dialog and support around topic of loss, with particular support around experiences of loss in their lives. Group members identified types of loss (relationships / self / things) as well as patterns, circumstances, and changes that precipitate loss. Reflection invited on thoughts / feelings around loss, normalized grief responses, and recognized variety in grief experience. Group noted Worden's four tasks of grief in discussion. Group drew on Adlerian / Rogerian, narrative, MI, with Yalom's group therapy as a primary framework.   Observations: Natalie Edwards was reserved but still engaged in the group discussion.  Swade Shonka L. Fredrica, M.Div

## 2023-11-06 NOTE — Plan of Care (Signed)

## 2023-11-06 NOTE — Group Note (Deleted)
 Date:  11/06/2023 Time:  8:31 PM  Group Topic/Focus:  Building Self Esteem:   The Focus of this group is helping patients become aware of the effects of self-esteem on their lives, the things they and others do that enhance or undermine their self-esteem, seeing the relationship between their level of self-esteem and the choices they make and learning ways to enhance self-esteem.     Participation Level:  {[facility] PARTICIPATION OZCZO:77735}  Participation Quality:  {[facility] PARTICIPATION QUALITY:22265}  Affect:  {[facility] AFFECT:22266}  Cognitive:  {[facility] COGNITIVE:22267}  Insight: {B[facility] Insight2:20797}  Engagement in Group:  {B[facility] ENGAGEMENT IN HMNLE:77731}  Modes of Intervention:  {B[facility] MODES OF INTERVENTION:22269}  Additional Comments:  ***  [name] Sirron Francesconi 11/06/2023, 8:31 PM

## 2023-11-06 NOTE — Progress Notes (Signed)
   11/06/23 1200  Psych Admission Type (Psych Patients Only)  Admission Status Voluntary  Psychosocial Assessment  Patient Complaints Anxiety;Depression;Appetite decrease  Eye Contact Fair  Facial Expression Flat  Affect Flat  Speech Logical/coherent;Soft  Interaction Assertive  Motor Activity Slow  Appearance/Hygiene Body odor;Disheveled;Poor hygiene  Behavior Characteristics Cooperative  Mood Pleasant  Thought Process  Coherency WDL  Content WDL  Delusions None reported or observed  Perception WDL  Hallucination None reported or observed  Judgment Poor  Confusion None  Danger to Self  Current suicidal ideation? Denies  Danger to Others  Danger to Others None reported or observed

## 2023-11-06 NOTE — Group Note (Signed)
 Recreation Therapy Group Note   Group Topic:Leisure Education  Group Date: 11/06/2023 Start Time: 1040 End Time: 1130 Facilitators: Celestia Jeoffrey BRAVO, LRT, CTRS Location: Craft Room  Group Description: Leisure. Patients were given the option to choose from singing karaoke, coloring mandalas, using oil pastels, journaling, painting or playing with play-doh. LRT and pts discussed the meaning of leisure, the importance of participating in leisure during their free time/when they're outside of the hospital, as well as how our leisure interests can also serve as coping skills.   Goal Area(s) Addressed:  Patient will identify a current leisure interest.  Patient will learn the definition of "leisure". Patient will practice making a positive decision. Patient will have the opportunity to try a new leisure activity. Patient will communicate with peers and LRT.   Affect/Mood: N/A   Participation Level: Did not attend    Clinical Observations/Individualized Feedback: Patient did not attend group.   Plan: Continue to engage patient in RT group sessions 2-3x/week.   [address] LRT, CTRS 11/06/2023 1:09 PM

## 2023-11-06 NOTE — BH IP Treatment Plan (Signed)
 Interdisciplinary Treatment and Diagnostic Plan Update  11/06/2023 Time of Session: 10;22 AM Ulanda Tackett MRN: [id number]  Principal Diagnosis: <principal problem not specified>  Secondary Diagnoses: Active Problems:   MDD (major depressive disorder), recurrent severe, without psychosis (HCC)   Current Medications:  Current Facility-Administered Medications  Medication Dose Route Frequency Provider Last Rate Last Admin   acetaminophen  (TYLENOL ) tablet 650 mg  650 mg Oral Q6H PRN Mannie Jerel PARAS, NP   650 mg at 11/06/23 1033   alum & mag hydroxide-simeth (MAALOX/MYLANTA) 200-200-20 MG/5ML suspension 30 mL  30 mL Oral Q4H PRN Mannie Jerel PARAS, NP       haloperidol  (HALDOL ) tablet 5 mg  5 mg Oral TID PRN Mannie Jerel PARAS, NP       And   diphenhydrAMINE  (BENADRYL ) capsule 50 mg  50 mg Oral TID PRN Mannie Jerel PARAS, NP       haloperidol  lactate (HALDOL ) injection 5 mg  5 mg Intramuscular TID PRN Mannie Jerel PARAS, NP       And   diphenhydrAMINE  (BENADRYL ) injection 50 mg  50 mg Intramuscular TID PRN Mannie Jerel PARAS, NP       And   LORazepam  (ATIVAN ) injection 2 mg  2 mg Intramuscular TID PRN Mannie Jerel PARAS, NP       haloperidol  lactate (HALDOL ) injection 10 mg  10 mg Intramuscular TID PRN Mannie Jerel PARAS, NP       And   diphenhydrAMINE  (BENADRYL ) injection 50 mg  50 mg Intramuscular TID PRN Mannie Jerel PARAS, NP       And   LORazepam  (ATIVAN ) injection 2 mg  2 mg Intramuscular TID PRN Mannie Jerel PARAS, NP       FLUoxetine  (PROZAC ) capsule 40 mg  40 mg Oral Daily Cleotilde Hoy HERO, NP   40 mg at 11/06/23 9187   magnesium  hydroxide (MILK OF MAGNESIA) suspension 30 mL  30 mL Oral Daily PRN Mannie Jerel PARAS, NP       methocarbamol  (ROBAXIN ) tablet 750 mg  750 mg Oral Q8H PRN Mannie Jerel PARAS, NP   750 mg at 11/05/23 1206   nicotine  (NICODERM CQ  - dosed in mg/24 hours) patch 14 mg  14 mg Transdermal Q0600 Mannie Jerel PARAS, NP   14 mg at 11/06/23 9367   oxyCODONE  (Oxy IR/ROXICODONE )  immediate release tablet 5 mg  5 mg Oral Q4H PRN Mannie Jerel PARAS, NP   5 mg at 11/06/23 9187   traZODone  (DESYREL ) tablet 100 mg  100 mg Oral QHS Cleotilde Hoy HERO, NP   100 mg at 11/05/23 2103   PTA Medications: Medications Prior to Admission  Medication Sig Dispense Refill Last Dose/Taking   acetaminophen  (TYLENOL ) 500 MG tablet Take 2 tablets (1,000 mg total) by mouth every 6 (six) hours as needed.      busPIRone  (BUSPAR ) 7.5 MG tablet Take 7.5 mg by mouth 2 (two) times daily. (Patient not taking: Reported on 10/23/2023)      dicyclomine  (BENTYL ) 20 MG tablet Take 1 tablet (20 mg total) by mouth 2 (two) times daily. (Patient not taking: Reported on 10/23/2023) 20 tablet 0    FLUoxetine  (PROZAC ) 20 MG capsule Take 20 mg by mouth daily. (Patient not taking: Reported on 10/23/2023)      hydrOXYzine  (ATARAX ) 50 MG tablet Take 50 mg by mouth at bedtime. (Patient not taking: Reported on 11/03/2023)      methocarbamol  (ROBAXIN ) 750 MG tablet Take 1 tablet (750 mg total) by mouth every 8 (eight)  hours as needed for muscle spasms. 60 tablet 0    mirtazapine  (REMERON ) 45 MG tablet Take 45 mg by mouth at bedtime. (Patient not taking: Reported on 10/23/2023)      nicotine  (NICODERM CQ  - DOSED IN MG/24 HOURS) 21 mg/24hr patch Place 1 patch (21 mg total) onto the skin daily. (Patient not taking: Reported on 10/23/2023) 28 patch 0    ondansetron  (ZOFRAN -ODT) 4 MG disintegrating tablet Take 4 mg by mouth every 8 (eight) hours as needed.      oxyCODONE  (OXY IR/ROXICODONE ) 5 MG immediate release tablet Take 1-2 tablets (5-10 mg total) by mouth every 4 (four) hours as needed for moderate pain (pain score 4-6) or severe pain (pain score 7-10). 30 tablet 0    pantoprazole  (PROTONIX ) 40 MG tablet Take 1 tablet (40 mg total) by mouth 2 (two) times daily. (Patient not taking: Reported on 10/23/2023) 30 tablet 2    pregabalin  (LYRICA ) 75 MG capsule Take 1 capsule (75 mg total) by mouth 2 (two) times daily. (Patient not taking:  Reported on 11/03/2023) 60 capsule 0    propranolol  (INDERAL ) 10 MG tablet Take 10 mg by mouth daily. (Patient not taking: Reported on 11/03/2023)      risperiDONE  (RISPERDAL ) 1 MG tablet Take 1 mg by mouth 2 (two) times daily. (Patient not taking: Reported on 10/23/2023)      temazepam  (RESTORIL ) 15 MG capsule Take 15 mg by mouth at bedtime as needed. (Patient not taking: Reported on 10/23/2023)       Patient Stressors:    Patient Strengths:    Treatment Modalities: Medication Management, Group therapy, Case management,  1 to 1 session with clinician, Psychoeducation, Recreational therapy.   Physician Treatment Plan for Primary Diagnosis: <principal problem not specified> Long Term Goal(s): Improvement in symptoms so as ready for discharge   Short Term Goals: Ability to identify changes in lifestyle to reduce recurrence of condition will improve  Medication Management: Evaluate patient's response, side effects, and tolerance of medication regimen.  Therapeutic Interventions: 1 to 1 sessions, Unit Group sessions and Medication administration.  Evaluation of Outcomes: Not Met  Physician Treatment Plan for Secondary Diagnosis: Active Problems:   MDD (major depressive disorder), recurrent severe, without psychosis (HCC)  Long Term Goal(s): Improvement in symptoms so as ready for discharge   Short Term Goals: Ability to identify changes in lifestyle to reduce recurrence of condition will improve     Medication Management: Evaluate patient's response, side effects, and tolerance of medication regimen.  Therapeutic Interventions: 1 to 1 sessions, Unit Group sessions and Medication administration.  Evaluation of Outcomes: Not Met   RN Treatment Plan for Primary Diagnosis: <principal problem not specified> Long Term Goal(s): Knowledge of disease and therapeutic regimen to maintain health will improve  Short Term Goals: Ability to verbalize frustration and anger appropriately will improve,  Ability to demonstrate self-control, Ability to participate in decision making will improve, Ability to verbalize feelings will improve, Ability to disclose and discuss suicidal ideas, and Ability to identify and develop effective coping behaviors will improve  Medication Management: RN will administer medications as ordered by provider, will assess and evaluate patient's response and provide education to patient for prescribed medication. RN will report any adverse and/or side effects to prescribing provider.  Therapeutic Interventions: 1 on 1 counseling sessions, Psychoeducation, Medication administration, Evaluate responses to treatment, Monitor vital signs and CBGs as ordered, Perform/monitor CIWA, COWS, AIMS and Fall Risk screenings as ordered, Perform wound care treatments as ordered.  Evaluation  of Outcomes: Not Met   LCSW Treatment Plan for Primary Diagnosis: <principal problem not specified> Long Term Goal(s): Safe transition to appropriate next level of care at discharge, Engage patient in therapeutic group addressing interpersonal concerns.  Short Term Goals: Engage patient in aftercare planning with referrals and resources, Increase social support, Increase ability to appropriately verbalize feelings, Increase emotional regulation, Facilitate acceptance of mental health diagnosis and concerns, Facilitate patient progression through stages of change regarding substance use diagnoses and concerns, Identify triggers associated with mental health/substance abuse issues, and Increase skills for wellness and recovery  Therapeutic Interventions: Assess for all discharge needs, 1 to 1 time with Social worker, Explore available resources and support systems, Assess for adequacy in community support network, Educate family and significant other(s) on suicide prevention, Complete Psychosocial Assessment, Interpersonal group therapy.  Evaluation of Outcomes: Not Met   Progress in  Treatment: Attending groups: Yes. and No. Participating in groups: Yes. and No. Taking medication as prescribed: Yes. Toleration medication: Yes. Family/Significant other contact made: No, will contact:  CSW to contact once permission is granted.  Patient understands diagnosis: Yes. Discussing patient identified problems/goals with staff: Yes. Medical problems stabilized or resolved: Yes. Denies suicidal/homicidal ideation: Yes. Issues/concerns per patient self-inventory: No. Other: None  New problem(s) identified: No, Describe:  None  New Short Term/Long Term Goal(s):detox, elimination of symptoms of psychosis, medication management for mood stabilization; elimination of SI thoughts; development of comprehensive mental wellness/sobriety plan.    Patient Goals:  Try to make myself better.  Discharge Plan or Barriers: CSW to assist with the development of appropriate discharge plan.    Reason for Continuation of Hospitalization: Anxiety Depression Medication stabilization Suicidal ideation  Estimated Length of Stay: 1-7 days.   Last 3 [place] Suicide Severity Risk Score: Flowsheet Row ED from 11/03/2023 in [facility] Emergency Department at [facility] ED to Hosp-Admission (Discharged) from 10/22/2023 in [facility] 3M KIDNEY UNIT ED from 10/20/2023 in [facility] Emergency Department at [facility]  C-SSRS RISK CATEGORY High Risk No Risk No Risk    Last [facility] 2/9 Scores:    07/07/2023    8:44 AM 03/10/2023   10:16 AM 10/28/2022    2:47 PM  Depression screen PHQ 2/9  Decreased Interest 2 1 0  Down, Depressed, Hopeless 1 0 0  PHQ - 2 Score 3 1 0  Altered sleeping 3  0  Tired, decreased energy 3  0  Change in appetite 3  0  Feeling bad or failure about yourself  3  0  Trouble concentrating 1  0  Moving slowly or fidgety/restless 1  0  Suicidal thoughts 1  0  PHQ-9 Score 18  0  Difficult doing work/chores Extremely dIfficult  Not difficult at all     Scribe for Treatment Team: Darald Uzzle M Rodina Pinales, LCSW 11/06/2023 11:04 AM
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
## 2023-11-06 NOTE — Group Note (Signed)
 Date:  11/06/2023 Time:  10:59 AM  Group Topic/Focus:  Goals Group:   The focus of this group is to help patients establish daily goals to achieve during treatment and discuss how the patient can incorporate goal setting into their daily lives to aide in recovery.    Participation Level:  Active  Participation Quality:  Appropriate  Affect:  Appropriate  Cognitive:  Appropriate  Insight: Appropriate  Engagement in Group:  Engaged  Modes of Intervention:  Discussion, Education, and Support  Additional Comments:    Deitra Caron Mainland 11/06/2023, 10:59 AM

## 2023-11-07 MED ORDER — ADULT MULTIVITAMIN W/MINERALS CH
1.0000 | ORAL_TABLET | Freq: Every day | ORAL | Status: DC
  Administered 2023-11-08 – 2023-11-10 (×3): 1 via ORAL
  Filled 2023-11-07 (×3): qty 1

## 2023-11-07 NOTE — Progress Notes (Signed)
 [facility] MD Progress Note  11/07/2023 1:17 PM Tecora Eustache  MRN:  [id number]   Patient is a 58 year old female admitted following intentional overdose attempt. She presented to the emergency department after being found at home by her son, who reported ingestion of an unknown type and amount of pills.  Reportedly, symptoms of depression have worsened since recent surgery related to pain and increased isolation. She reports prior to event she had thoughts to bring herself into for mental health care and did not. She reports son is supportive   Subjective:  Chart reviewed, case discussed in multidisciplinary meeting, patient seen during rounds.   Seen today for follow-up psychiatric evaluation. Upon approach, patient is agreeable for interview and reports, "I'm feeling better." She denies suicidal ideation, homicidal ideation, hallucinations, paranoia, or delusions. She reports sleep and appetite have improved since admission. She remains future oriented and has begun to engage in unit routine and engage with peers. No medication side effects reported.  Sleep: Good  Appetite:  Good  Past Psychiatric History: see h&P Family History:  Family History  Problem Relation Age of Onset   Cancer Maternal Uncle        Lung   Cancer Maternal Uncle        Lung   Cancer Maternal Uncle        Lung   Headache Neg Hx        I don't think so   Migraines Neg Hx        I don't think so   Social History:  Social History   Substance and Sexual Activity  Alcohol Use Yes   Comment: occasionally - last drink was July 4, 1 shot     Social History   Substance and Sexual Activity  Drug Use No    Social History   Socioeconomic History   Marital status: Single    Spouse name: Not on file   Number of children: 3   Years of education: Not on file   Highest education level: 12th grade  Occupational History   Occupation: environmental services at [employer]  Tobacco Use   Smoking status: Every Day     Current packs/day: 0.50    Average packs/day: 0.5 packs/day for 25.0 years (12.5 ttl pk-yrs)    Types: Cigarettes   Smokeless tobacco: Never   Tobacco comments:    Pt tried to quit - helps with anxiety     1 pack last patient 3 days   Vaping Use   Vaping status: Never Used  Substance and Sexual Activity   Alcohol use: Yes    Comment: occasionally - last drink was July 4, 1 shot   Drug use: No   Sexual activity: Yes    Comment: hysterectomy  Other Topics Concern   Not on file  Social History Narrative   Lives at home in an apartment. Her mother lives with her.    Right handed   Caffeine: drinks approx. 36 oz of pepsi per day. Sometimes drinks 2 cups of coffee in a day as well.    Social Drivers of Corporate investment banker Strain: Low Risk  (03/06/2023)   Overall Financial Resource Strain (CARDIA)    Difficulty of Paying Living Expenses: Not very hard  Food Insecurity: Food Insecurity Present (11/04/2023)   Hunger Vital Sign    Worried About Running Out of Food in the Last Year: Never true    Ran Out of Food in the Last Year: Sometimes true  Transportation  Needs: Unmet Transportation Needs (11/04/2023)   PRAPARE - Transportation    Lack of Transportation (Medical): No    Lack of Transportation (Non-Medical): Yes  Physical Activity: Sufficiently Active (05/22/2022)   Exercise Vital Sign    Days of Exercise per Week: 4 days    Minutes of Exercise per Session: 70 min  Stress: Stress Concern Present (03/06/2023)   [employer] of Occupational Health - Occupational Stress Questionnaire    Feeling of Stress : Very much  Social Connections: Socially Isolated (11/04/2023)   Social Connection and Isolation Panel    Frequency of Communication with Friends and Family: Never    Frequency of Social Gatherings with Friends and Family: Never    Attends Religious Services: Never    Database administrator or Organizations: No    Attends Engineer, structural: Never     Marital Status: Divorced   Past Medical History:  Past Medical History:  Diagnosis Date   Allergy    Anemia    Diverticulosis    Gall bladder stones 1993   Gastric ulcer    GERD (gastroesophageal reflux disease)    Hypertension    Renal mass     Past Surgical History:  Procedure Laterality Date   ABDOMINAL HYSTERECTOMY     BIOPSY  09/29/2022   Procedure: BIOPSY;  Surgeon: Shila Gustav GAILS, MD;  Location: [facility];  Service: Gastroenterology;;   CHOLECYSTECTOMY     COLONOSCOPY WITH ESOPHAGOGASTRODUODENOSCOPY (EGD)  04/07/2023   [facility]   ESOPHAGOGASTRODUODENOSCOPY (EGD) WITH PROPOFOL  N/A 09/29/2022   Procedure: ESOPHAGOGASTRODUODENOSCOPY (EGD) WITH PROPOFOL ;  Surgeon: Shila Gustav GAILS, MD;  Location: [facility];  Service: Gastroenterology;  Laterality: N/A;   LAPAROSCOPIC SIGMOID COLECTOMY N/A 10/27/2023   Procedure: COLECTOMY, SIGMOID, LAPAROSCOPIC;  Surgeon: Lyndel Deward PARAS, MD;  Location: [facility] OR;  Service: General;  Laterality: N/A;   MOBILIZATION, SPLENIC FLEXURE, WITH PARTIAL COLECTOMY N/A 10/27/2023   Procedure: MOBILIZATION, SPLENIC FLEXURE;  Surgeon: Stechschulte, Deward PARAS, MD;  Location: [facility] OR;  Service: General;  Laterality: N/A;    Current Medications: Current Facility-Administered Medications  Medication Dose Route Frequency Provider Last Rate Last Admin   acetaminophen  (TYLENOL ) tablet 650 mg  650 mg Oral Q6H PRN Mannie Jerel PARAS, NP   650 mg at 11/06/23 1033   alum & mag hydroxide-simeth (MAALOX/MYLANTA) 200-200-20 MG/5ML suspension 30 mL  30 mL Oral Q4H PRN Mannie Jerel PARAS, NP       haloperidol  (HALDOL ) tablet 5 mg  5 mg Oral TID PRN Mannie Jerel PARAS, NP       And   diphenhydrAMINE  (BENADRYL ) capsule 50 mg  50 mg Oral TID PRN Mannie Jerel PARAS, NP       haloperidol  lactate (HALDOL ) injection 5 mg  5 mg Intramuscular TID PRN Mannie Jerel PARAS, NP       And   diphenhydrAMINE  (BENADRYL ) injection 50 mg  50 mg Intramuscular TID PRN Mannie Jerel PARAS,  NP       And   LORazepam  (ATIVAN ) injection 2 mg  2 mg Intramuscular TID PRN Mannie Jerel PARAS, NP       haloperidol  lactate (HALDOL ) injection 10 mg  10 mg Intramuscular TID PRN Mannie Jerel PARAS, NP       And   diphenhydrAMINE  (BENADRYL ) injection 50 mg  50 mg Intramuscular TID PRN Mannie Jerel PARAS, NP       And   LORazepam  (ATIVAN ) injection 2 mg  2 mg Intramuscular TID PRN Mannie Jerel PARAS,  NP       feeding supplement (ENSURE PLUS HIGH PROTEIN) liquid 237 mL  237 mL Oral BID BM Cleotilde Hoy HERO, NP       FLUoxetine  (PROZAC ) capsule 40 mg  40 mg Oral Daily Cleotilde Hoy HERO, NP   40 mg at 11/07/23 9156   magnesium  hydroxide (MILK OF MAGNESIA) suspension 30 mL  30 mL Oral Daily PRN Mannie Jerel PARAS, NP       methocarbamol  (ROBAXIN ) tablet 750 mg  750 mg Oral Q8H PRN Mannie Jerel PARAS, NP   750 mg at 11/05/23 1206   [START ON 11/08/2023] multivitamin with minerals tablet 1 tablet  1 tablet Oral Daily Jadapalle, Sree, MD       nicotine  (NICODERM CQ  - dosed in mg/24 hours) patch 14 mg  14 mg Transdermal Q0600 Mannie Jerel PARAS, NP   14 mg at 11/07/23 9359   oxyCODONE  (Oxy IR/ROXICODONE ) immediate release tablet 5 mg  5 mg Oral Q4H PRN Mannie Jerel PARAS, NP   5 mg at 11/07/23 1113   traZODone  (DESYREL ) tablet 100 mg  100 mg Oral QHS Cleotilde Hoy HERO, NP   100 mg at 11/06/23 2138    Lab Results: No results found for this or any previous visit (from the past 48 hours).  Blood Alcohol level:  Lab Results  Component Value Date   [facility] <15 11/03/2023   ETH <15 10/07/2023    Metabolic Disorder Labs: Lab Results  Component Value Date   HGBA1C 5.2 05/22/2023   MPG 102.54 05/22/2023   No results found for: PROLACTIN Lab Results  Component Value Date   CHOL 183 05/29/2023   TRIG 87 05/29/2023   HDL 63 05/29/2023   CHOLHDL 2.9 05/29/2023   VLDL 17 05/29/2023   LDLCALC 103 (H) 05/29/2023   LDLCALC 106 (H) 12/13/2021       Psychiatric Specialty Exam: Appearance: casual, fair hygiene,  improving  Eye contact: good Psychomotor activity: normal range Speech: normal rate tone volume  Mood: depressed Affect: flat Thought process: linear, goal-directed Thought content: denies SI/HI, no psychosis observed Perception: no AVH or paranoia Orientation: fully oriented Memory: intact Concentration: good Recall: intact Fund of knowledge: appropriate Language: intact Insight: fair Judgment: poor -improving  Musculoskeletal: Strength & Muscle Tone: within normal limits Gait & Station: normal Assets  Assets: Manufacturing systems engineer; Desire for Improvement; Physical Health    Physical Exam: Physical Exam ROS Blood pressure (!) 150/77, pulse 81, temperature 97.7 F (36.5 C), temperature source Oral, resp. rate 18, height 5' 4 (1.626 m), weight 84.6 kg, SpO2 93%. Body mass index is 32.01 kg/m.  Diagnosis: Active Problems:   MDD (major depressive disorder), recurrent severe, without psychosis (HCC)   PLAN: Safety and Monitoring:  -- Voluntary admission to inpatient psychiatric unit for safety, stabilization and treatment  -- Daily contact with patient to assess and evaluate symptoms and progress in treatment  -- Patient's case to be discussed in multi-disciplinary team meeting  -- Observation Level : q15 minute checks  -- Vital signs:  q12 hours  -- Precautions: suicide, elopement, and assault -- Encouraged patient to participate in unit milieu and in scheduled group therapies  2. Psychiatric Diagnoses and Treatment:   Patient presentation is meeting diagnostic criteria for major depressive disorder, recurrent, severe, without psychotic features, based on dysphoric mood, poor appetite, hopelessness, functional decline, and history of suicidal ideation and behaviors. While she requests discharge, symptoms remain significant and stabilization is required prior to safe transition. Patient continues to  require this acute inpatient psychiatric setting for safety, monitoring,  and medication management.  Psychiatric Plan / Interventions No changes to plan of care today. Continue current regimen, including Prozac  and trazodone . Monitor mood, safety, and appetite. Continue to reinforce coping skills and encourage engagement in treatment. Nursing reports staples intact with no concerns for infection; will continue to monitor surgical recovery. Medication education provided to include risks, benefits, and side effects. Patient verbalized understanding and agrees to plan of care.  Psychiatric Medications Prozac  40 mg daily Trazodone  100 mg nightly                                        3. Medical Issues Being Addressed: ordering nutrition consult, add Ensure bid       4. Discharge Planning:   -- Social work and case management to assist with discharge planning and identification of hospital follow-up needs prior to discharge  -- Estimated LOS: 3-4 days  Hoy CHRISTELLA Pinal, NP 11/07/2023, 1:17 PM

## 2023-11-07 NOTE — Progress Notes (Signed)
   11/07/23 0900  Psych Admission Type (Psych Patients Only)  Admission Status Voluntary  Psychosocial Assessment  Patient Complaints Anxiety;Depression  Eye Contact Fair  Facial Expression Flat  Affect Flat  Speech Logical/coherent  Interaction Assertive  Motor Activity Slow  Appearance/Hygiene Poor hygiene  Behavior Characteristics Cooperative;Calm  Mood Pleasant  Thought Process  Coherency WDL  Content WDL  Delusions None reported or observed  Perception WDL  Hallucination None reported or observed  Judgment Poor  Confusion None  Danger to Self  Current suicidal ideation? Denies  Agreement Not to Harm Self Yes  Description of Agreement Verbal  Danger to Others  Danger to Others None reported or observed   Natalie Edwards spent a lot of this shift in her room alone, but participated in some group activities and was more social today than the last time this RN saw her on 8/21. No c/o today other than abdominal pain, which she states is controlled well enough by her medications that she can function and eat.

## 2023-11-07 NOTE — Progress Notes (Signed)
 Initial Nutrition Assessment  DOCUMENTATION CODES:   Obesity unspecified  INTERVENTION:   Continue Ensure Plus High Protein po BID, each supplement provides 350 kcal and 20 grams of protein   MVI po daily   NUTRITION DIAGNOSIS:   Inadequate oral intake related to social / environmental circumstances as evidenced by per patient/family report.  GOAL:   Patient will meet greater than or equal to 90% of their needs  MONITOR:   PO intake, Supplement acceptance  REASON FOR ASSESSMENT:   Consult Poor PO  ASSESSMENT:   58 y/o female with h/o MDD, anxiety, SI, IDA, PUD, HTN, CKD II, GERD, HLD, etoh abuseand diverticulitis s/p partiral colectomy 8/12 who is admitted with intentional overdose attempt.  RD working remotely.  Per chart review, pt reports poor oral intake at baseline. Pt reported that she eats a lot of jello.  Pt had has frequent admissions and ED visit for abdomen pain and nausea over the past year and did have recent colectomy for diverticulitis. Per chart, pt with 11lb weight gain since May. Recommend continue Ensure supplements. RD will add MVI daily. Pt is likely at refeed risk.    Medications reviewed and include: nicotine    Labs reviewed:   Diet Order:   Diet Order             Diet regular Room service appropriate? Yes; Fluid consistency: Thin  Diet effective now                  EDUCATION NEEDS:   No education needs have been identified at this time  Skin:  Skin Assessment: Reviewed RN Assessment (closed incision abdomen)  Last BM:  8/23  Height:   Ht Readings from Last 1 Encounters:  11/04/23 5' 4 (1.626 m)    Weight:   Wt Readings from Last 1 Encounters:  11/04/23 84.6 kg    Ideal Body Weight:  54.5 kg  BMI:  Body mass index is 32.01 kg/m.  Estimated Nutritional Needs:   Kcal:  1800-2100kcal/day  Protein:  90-105g/day  Fluid:  1.7-1.9L/day  Augustin Shams MS, RD, LDN If unable to be reached, please send secure chat  to RD inpatient available from 8:00a-4:00p daily

## 2023-11-07 NOTE — Group Note (Signed)
 Date:  11/07/2023 Time:  9:15 PM  Group Topic/Focus:  Coping With Mental Health Crisis:   The purpose of this group is to help patients identify strategies for coping with mental health crisis.  Group discusses possible causes of crisis and ways to manage them effectively. Wrap-Up Group:   The focus of this group is to help patients review their daily goal of treatment and discuss progress on daily workbooks.    Participation Level:  Active  Participation Quality:  Appropriate and Attentive  Affect:  Appropriate  Cognitive:  Alert and Appropriate  Insight: Appropriate and Good  Engagement in Group:  Engaged and Supportive  Modes of Intervention:  Discussion  Additional Comments:     Natalie Edwards Servant 11/07/2023, 9:15 PM

## 2023-11-07 NOTE — Group Note (Signed)
 Date:  11/07/2023 Time:  7:07 PM  Group Topic/Focus:  Activity Group:  The focus of the group is to encourage patients to go outside in the courtyard and get some fresh air and some exercise.    Participation Level:  Active  Participation Quality:  Appropriate  Affect:  Appropriate  Cognitive:  Appropriate  Insight: Appropriate  Engagement in Group:  Engaged  Modes of Intervention:  Activity  Additional Comments:    [name] Natalie Edwards 11/07/2023, 7:07 PM

## 2023-11-07 NOTE — Group Note (Signed)
 Date:  11/07/2023 Time:  6:57 PM  Group Topic/Focus:  Coping With Mental Health Crisis:   The purpose of this group is to help patients identify strategies for coping with mental health crisis.  Group discusses possible causes of crisis and ways to manage them effectively.    Participation Level:  Active  Participation Quality:  Appropriate  Affect:  Appropriate  Cognitive:  Appropriate  Insight: Appropriate  Engagement in Group:  Engaged  Modes of Intervention:  Activity  Additional Comments:    [name] Wallice Granville 11/07/2023, 6:57 PM

## 2023-11-07 NOTE — Group Note (Deleted)
 Date:  11/07/2023 Time:  9:01 PM  Group Topic/Focus:  Coping With Mental Health Crisis:   The purpose of this group is to help patients identify strategies for coping with mental health crisis.  Group discusses possible causes of crisis and ways to manage them effectively. Wrap-Up Group:   The focus of this group is to help patients review their daily goal of treatment and discuss progress on daily workbooks.     Participation Level:  {BHH PARTICIPATION OZCZO:77735}  Participation Quality:  {BHH PARTICIPATION QUALITY:22265}  Affect:  {BHH AFFECT:22266}  Cognitive:  {BHH COGNITIVE:22267}  Insight: {BHH Insight2:20797}  Engagement in Group:  {BHH ENGAGEMENT IN HMNLE:77731}  Modes of Intervention:  {BHH MODES OF INTERVENTION:22269}  Additional Comments:  ***  Natalie Edwards 11/07/2023, 9:01 PM

## 2023-11-07 NOTE — Plan of Care (Signed)
  Problem: Education: Goal: Emotional status will improve Outcome: Progressing   Problem: Education: Goal: Mental status will improve Outcome: Progressing   Problem: Education: Goal: Verbalization of understanding the information provided will improve Outcome: Progressing   Problem: Activity: Goal: Interest or engagement in activities will improve Outcome: Progressing   Problem: Activity: Goal: Sleeping patterns will improve Outcome: Progressing   Problem: Coping: Goal: Ability to demonstrate self-control will improve Outcome: Progressing   Problem: Coping: Goal: Ability to verbalize frustrations and anger appropriately will improve Outcome: Progressing   Problem: Safety: Goal: Periods of time without injury will increase Outcome: Progressing

## 2023-11-08 DIAGNOSIS — F332 Major depressive disorder, recurrent severe without psychotic features: Secondary | ICD-10-CM

## 2023-11-08 NOTE — Progress Notes (Signed)
 Affect bright, visible, engaging and participating in units activities.  Denes depression, anxiety and SI, adding I am ready to go home.  Oxycodone  given with positive effect.  Pt remains care compliant.   11/07/23 2100  Psych Admission Type (Psych Patients Only)  Admission Status Voluntary  Psychosocial Assessment  Patient Complaints None  Eye Contact Fair  Facial Expression Animated  Affect Appropriate to circumstance  Speech Logical/coherent  Interaction Assertive  Motor Activity Slow;Other (Comment) (WDL)  Appearance/Hygiene Poor hygiene  Behavior Characteristics Cooperative;Appropriate to situation  Mood Pleasant  Thought Process  Coherency WDL  Content WDL  Delusions None reported or observed  Perception WDL  Hallucination None reported or observed  Judgment Limited  Confusion None  Danger to Self  Current suicidal ideation? Denies  Agreement Not to Harm Self Yes  Description of Agreement Verbal  Danger to Others  Danger to Others None reported or observed    Problem: Education: Goal: Knowledge of [facility] Education information/materials will improve Outcome: Progressing Goal: Emotional status will improve Outcome: Progressing Goal: Mental status will improve Outcome: Progressing Goal: Verbalization of understanding the information provided will improve Outcome: Progressing   Problem: Activity: Goal: Interest or engagement in activities will improve Outcome: Progressing Goal: Sleeping patterns will improve Outcome: Progressing   Problem: Coping: Goal: Ability to verbalize frustrations and anger appropriately will improve Outcome: Progressing Goal: Ability to demonstrate self-control will improve Outcome: Progressing

## 2023-11-08 NOTE — Progress Notes (Signed)
 [facility] MD Progress Note  11/08/2023 8:03 AM Natalie Edwards  MRN:  [id number]   Patient is a 58 year old female admitted following intentional overdose attempt. She presented to the emergency department after being found at home by her son, who reported ingestion of an unknown type and amount of pills.  Reportedly, symptoms of depression have worsened since recent surgery related to pain and increased isolation. She reports prior to event she had thoughts to bring herself into for mental health care and did not. She reports son is supportive   Subjective:  Chart reviewed, case discussed in multidisciplinary meeting, patient seen during rounds.   Patient seen today for follow-up psychiatric evaluation. She was observed resting in her room and awoke easily for interview. She reports having slept through the night and states, "I'm great." She denies suicidal or homicidal ideation, hallucinations, paranoia, or delusions. She reports no ongoing symptoms of depression or anxiety. Appetite remains good. No medication side effects endorsed.  11/07/23: Seen today for follow-up psychiatric evaluation. Upon approach, patient is agreeable for interview and reports, "I'm feeling better." She denies suicidal ideation, homicidal ideation, hallucinations, paranoia, or delusions. She reports sleep and appetite have improved since admission. She remains future oriented and has begun to engage in unit routine and engage with peers. No medication side effects reported.  Sleep: Good  Appetite:  Good  Past Psychiatric History: see h&P Family History:  Family History  Problem Relation Age of Onset   Cancer Maternal Uncle        Lung   Cancer Maternal Uncle        Lung   Cancer Maternal Uncle        Lung   Headache Neg Hx        I don't think so   Migraines Neg Hx        I don't think so   Social History:  Social History   Substance and Sexual Activity  Alcohol Use Yes   Comment: occasionally - last  drink was July 4, 1 shot     Social History   Substance and Sexual Activity  Drug Use No    Social History   Socioeconomic History   Marital status: Single    Spouse name: Not on file   Number of children: 3   Years of education: Not on file   Highest education level: 12th grade  Occupational History   Occupation: environmental services at [employer]  Tobacco Use   Smoking status: Every Day    Current packs/day: 0.50    Average packs/day: 0.5 packs/day for 25.0 years (12.5 ttl pk-yrs)    Types: Cigarettes   Smokeless tobacco: Never   Tobacco comments:    Pt tried to quit - helps with anxiety     1 pack last patient 3 days   Vaping Use   Vaping status: Never Used  Substance and Sexual Activity   Alcohol use: Yes    Comment: occasionally - last drink was July 4, 1 shot   Drug use: No   Sexual activity: Yes    Comment: hysterectomy  Other Topics Concern   Not on file  Social History Narrative   Lives at home in an apartment. Her mother lives with her.    Right handed   Caffeine: drinks approx. 36 oz of pepsi per day. Sometimes drinks 2 cups of coffee in a day as well.    Social Drivers of Health   Financial Resource Strain: Low Risk  (03/06/2023)  Overall Financial Resource Strain (CARDIA)    Difficulty of Paying Living Expenses: Not very hard  Food Insecurity: Food Insecurity Present (11/04/2023)   Hunger Vital Sign    Worried About Running Out of Food in the Last Year: Never true    Ran Out of Food in the Last Year: Sometimes true  Transportation Needs: Unmet Transportation Needs (11/04/2023)   PRAPARE - Transportation    Lack of Transportation (Medical): No    Lack of Transportation (Non-Medical): Yes  Physical Activity: Sufficiently Active (05/22/2022)   Exercise Vital Sign    Days of Exercise per Week: 4 days    Minutes of Exercise per Session: 70 min  Stress: Stress Concern Present (03/06/2023)   [employer] of Occupational Health - Occupational Stress  Questionnaire    Feeling of Stress : Very much  Social Connections: Socially Isolated (11/04/2023)   Social Connection and Isolation Panel    Frequency of Communication with Friends and Family: Never    Frequency of Social Gatherings with Friends and Family: Never    Attends Religious Services: Never    Database administrator or Organizations: No    Attends Engineer, structural: Never    Marital Status: Divorced   Past Medical History:  Past Medical History:  Diagnosis Date   Allergy    Anemia    Diverticulosis    Gall bladder stones 1993   Gastric ulcer    GERD (gastroesophageal reflux disease)    Hypertension    Renal mass     Past Surgical History:  Procedure Laterality Date   ABDOMINAL HYSTERECTOMY     BIOPSY  09/29/2022   Procedure: BIOPSY;  Surgeon: Shila Gustav GAILS, MD;  Location: [facility];  Service: Gastroenterology;;   CHOLECYSTECTOMY     COLONOSCOPY WITH ESOPHAGOGASTRODUODENOSCOPY (EGD)  04/07/2023   [facility]   ESOPHAGOGASTRODUODENOSCOPY (EGD) WITH PROPOFOL  N/A 09/29/2022   Procedure: ESOPHAGOGASTRODUODENOSCOPY (EGD) WITH PROPOFOL ;  Surgeon: Shila Gustav GAILS, MD;  Location: [facility];  Service: Gastroenterology;  Laterality: N/A;   LAPAROSCOPIC SIGMOID COLECTOMY N/A 10/27/2023   Procedure: COLECTOMY, SIGMOID, LAPAROSCOPIC;  Surgeon: Lyndel Deward PARAS, MD;  Location: [facility] OR;  Service: General;  Laterality: N/A;   MOBILIZATION, SPLENIC FLEXURE, WITH PARTIAL COLECTOMY N/A 10/27/2023   Procedure: MOBILIZATION, SPLENIC FLEXURE;  Surgeon: Stechschulte, Deward PARAS, MD;  Location: [facility] OR;  Service: General;  Laterality: N/A;    Current Medications: Current Facility-Administered Medications  Medication Dose Route Frequency Provider Last Rate Last Admin   acetaminophen  (TYLENOL ) tablet 650 mg  650 mg Oral Q6H PRN Mannie Jerel PARAS, NP   650 mg at 11/06/23 1033   alum & mag hydroxide-simeth (MAALOX/MYLANTA) 200-200-20 MG/5ML suspension 30 mL  30 mL Oral  Q4H PRN Mannie Jerel PARAS, NP       haloperidol  (HALDOL ) tablet 5 mg  5 mg Oral TID PRN Mannie Jerel PARAS, NP       And   diphenhydrAMINE  (BENADRYL ) capsule 50 mg  50 mg Oral TID PRN Mannie Jerel PARAS, NP       haloperidol  lactate (HALDOL ) injection 5 mg  5 mg Intramuscular TID PRN Mannie Jerel PARAS, NP       And   diphenhydrAMINE  (BENADRYL ) injection 50 mg  50 mg Intramuscular TID PRN Mannie Jerel PARAS, NP       And   LORazepam  (ATIVAN ) injection 2 mg  2 mg Intramuscular TID PRN Mannie Jerel PARAS, NP       haloperidol  lactate (HALDOL ) injection 10  mg  10 mg Intramuscular TID PRN Mannie Jerel PARAS, NP       And   diphenhydrAMINE  (BENADRYL ) injection 50 mg  50 mg Intramuscular TID PRN Mannie Jerel PARAS, NP       And   LORazepam  (ATIVAN ) injection 2 mg  2 mg Intramuscular TID PRN Mannie Jerel PARAS, NP       feeding supplement (ENSURE PLUS HIGH PROTEIN) liquid 237 mL  237 mL Oral BID BM Cleotilde Hoy HERO, NP       FLUoxetine  (PROZAC ) capsule 40 mg  40 mg Oral Daily Cleotilde Hoy HERO, NP   40 mg at 11/07/23 9156   magnesium  hydroxide (MILK OF MAGNESIA) suspension 30 mL  30 mL Oral Daily PRN Mannie Jerel PARAS, NP       methocarbamol  (ROBAXIN ) tablet 750 mg  750 mg Oral Q8H PRN Mannie Jerel PARAS, NP   750 mg at 11/05/23 1206   multivitamin with minerals tablet 1 tablet  1 tablet Oral Daily Jadapalle, Sree, MD       nicotine  (NICODERM CQ  - dosed in mg/24 hours) patch 14 mg  14 mg Transdermal Q0600 Mannie Jerel PARAS, NP   14 mg at 11/08/23 9394   oxyCODONE  (Oxy IR/ROXICODONE ) immediate release tablet 5 mg  5 mg Oral Q4H PRN Mannie Jerel PARAS, NP   5 mg at 11/08/23 9389   traZODone  (DESYREL ) tablet 100 mg  100 mg Oral QHS Cleotilde Hoy HERO, NP   100 mg at 11/07/23 2113    Lab Results: No results found for this or any previous visit (from the past 48 hours).  Blood Alcohol level:  Lab Results  Component Value Date   [facility] <15 11/03/2023   ETH <15 10/07/2023    Metabolic Disorder Labs: Lab Results   Component Value Date   HGBA1C 5.2 05/22/2023   MPG 102.54 05/22/2023   No results found for: PROLACTIN Lab Results  Component Value Date   CHOL 183 05/29/2023   TRIG 87 05/29/2023   HDL 63 05/29/2023   CHOLHDL 2.9 05/29/2023   VLDL 17 05/29/2023   LDLCALC 103 (H) 05/29/2023   LDLCALC 106 (H) 12/13/2021       Psychiatric Specialty Exam: Appearance: casual, fair hygiene, improving  Eye contact: good Psychomotor activity: normal range Speech: normal rate tone volume  Mood: depressed-brighter Affect: flat Thought process: linear, goal-directed Thought content: denies SI/HI, no psychosis observed Perception: no AVH or paranoia Orientation: fully oriented Memory: intact Concentration: good Recall: intact Fund of knowledge: appropriate Language: intact Insight: fair Judgment: poor -improving  Musculoskeletal: Strength & Muscle Tone: within normal limits Gait & Station: normal Assets  Assets: Manufacturing systems engineer; Desire for Improvement; Physical Health    Physical Exam: Physical Exam ROS Blood pressure (!) 154/74, pulse 78, temperature 98.4 F (36.9 C), resp. rate 18, height 5' 4 (1.626 m), weight 84.6 kg, SpO2 97%. Body mass index is 32.01 kg/m.  Diagnosis: Active Problems:   MDD (major depressive disorder), recurrent severe, without psychosis (HCC)   PLAN: Safety and Monitoring:  -- Voluntary admission to inpatient psychiatric unit for safety, stabilization and treatment  -- Daily contact with patient to assess and evaluate symptoms and progress in treatment  -- Patient's case to be discussed in multi-disciplinary team meeting  -- Observation Level : q15 minute checks  -- Vital signs:  q12 hours  -- Precautions: suicide, elopement, and assault -- Encouraged patient to participate in unit milieu and in scheduled group therapies  2. Psychiatric Diagnoses and  Treatment:   Patient presentation is meeting diagnostic criteria for major depressive  disorder, recurrent, severe, without psychotic features, based on dysphoric mood, poor appetite, hopelessness, functional decline, and history of suicidal ideation and behaviors. While she requests discharge, symptoms remain significant and stabilization is required prior to safe transition. Patient continues to require this acute inpatient psychiatric setting for safety, monitoring, and medication management.  Psychiatric Plan / Interventions No changes to plan of care today. Continue current regimen, including Prozac  and trazodone . Monitor mood, safety, and appetite. Continue to reinforce coping skills and encourage engagement in treatment. Nursing reports staples intact with no concerns for infection; will continue to monitor surgical recovery. Medication education provided to include risks, benefits, and side effects. Patient verbalized understanding and agrees to plan of care.  Psychiatric Medications Prozac  40 mg daily Trazodone  100 mg nightly                                        3. Medical Issues Being Addressed: ordering nutrition consult, add Ensure bid       4. Discharge Planning:   -- Social work and case management to assist with discharge planning and identification of hospital follow-up needs prior to discharge  -- Estimated LOS: 3-4 days  Hoy CHRISTELLA Pinal, NP 11/08/2023, 8:03 AM

## 2023-11-08 NOTE — Group Note (Signed)
 Date:  11/08/2023 Time:  8:41 PM  Group Topic/Focus:  Coping With Mental Health Crisis:   The purpose of this group is to help patients identify strategies for coping with mental health crisis.  Group discusses possible causes of crisis and ways to manage them effectively. Wrap-Up Group:   The focus of this group is to help patients review their daily goal of treatment and discuss progress on daily workbooks.    Participation Level:  Minimal  Participation Quality:  Appropriate and Attentive  Affect:  Appropriate  Cognitive:  Alert and Appropriate  Insight: Appropriate and Good  Engagement in Group:  Engaged  Modes of Intervention:  Activity  Additional Comments:     Arlester CHRISTELLA Servant 11/08/2023, 8:41 PM

## 2023-11-08 NOTE — Plan of Care (Signed)
   Problem: Education: Goal: Knowledge of [facility] Education information/materials will improve Outcome: Progressing Goal: Emotional status will improve Outcome: Progressing Goal: Mental status will improve Outcome: Progressing Goal: Verbalization of understanding the information provided will improve Outcome: Progressing

## 2023-11-08 NOTE — Group Note (Signed)
 Date:  11/08/2023 Time:  3:39 PM  Group Topic/Focus:  Rediscovering Joy:   The focus of this group is to explore various ways to relieve stress in a positive manner.    Participation Level:  Active  Participation Quality:  Appropriate  Affect:  Appropriate  Cognitive:  Appropriate  Insight: Appropriate  Engagement in Group:  Engaged  Modes of Intervention:  Education  Additional Comments:    Dannie CHRISTELLA Hover 11/08/2023, 3:39 PM

## 2023-11-08 NOTE — Group Note (Unsigned)
 Date:  11/08/2023 Time:  8:32 PM  Group Topic/Focus:  Overcoming Stress:   The focus of this group is to define stress and help patients assess their triggers. Wrap-Up Group:   The focus of this group is to help patients review their daily goal of treatment and discuss progress on daily workbooks.     Participation Level:  {BHH PARTICIPATION OZCZO:77735}  Participation Quality:  {BHH PARTICIPATION QUALITY:22265}  Affect:  {BHH AFFECT:22266}  Cognitive:  {BHH COGNITIVE:22267}  Insight: {BHH Insight2:20797}  Engagement in Group:  {BHH ENGAGEMENT IN HMNLE:77731}  Modes of Intervention:  {BHH MODES OF INTERVENTION:22269}  Additional Comments:  ***  Arlester CHRISTELLA Servant 11/08/2023, 8:32 PM

## 2023-11-08 NOTE — Progress Notes (Signed)
   11/08/23 1800  Psych Admission Type (Psych Patients Only)  Admission Status Voluntary  Psychosocial Assessment  Patient Complaints None  Eye Contact Fair  Facial Expression Animated  Affect Appropriate to circumstance  Speech Logical/coherent  Interaction Assertive  Motor Activity Slow  Appearance/Hygiene Poor hygiene  Behavior Characteristics Cooperative;Appropriate to situation  Mood Pleasant  Thought Process  Coherency WDL  Content WDL  Delusions None reported or observed  Perception WDL  Hallucination None reported or observed  Judgment Limited  Confusion None  Danger to Self  Current suicidal ideation? Denies  Agreement Not to Harm Self Yes  Description of Agreement verbal  Danger to Others  Danger to Others None reported or observed

## 2023-11-09 DIAGNOSIS — F332 Major depressive disorder, recurrent severe without psychotic features: Secondary | ICD-10-CM | POA: Diagnosis not present

## 2023-11-09 MED ORDER — ADULT MULTIVITAMIN W/MINERALS CH: 1.0000 | ORAL_TABLET | Freq: Every day | ORAL | 0 refills | Status: DC

## 2023-11-09 MED ORDER — FLUOXETINE HCL 40 MG PO CAPS: 40.0000 mg | ORAL_CAPSULE | Freq: Every day | ORAL | 0 refills | Status: AC

## 2023-11-09 MED ORDER — OXYCODONE HCL 5 MG PO TABS: 5.0000 mg | ORAL_TABLET | ORAL | 0 refills | Status: DC | PRN

## 2023-11-09 MED ORDER — TRAZODONE HCL 100 MG PO TABS: 100.0000 mg | ORAL_TABLET | Freq: Every day | ORAL | 0 refills | Status: DC

## 2023-11-09 MED ORDER — NICOTINE 14 MG/24HR TD PT24: 14.0000 mg | MEDICATED_PATCH | Freq: Every day | TRANSDERMAL | 0 refills | Status: AC

## 2023-11-09 NOTE — Progress Notes (Signed)
   11/08/23 2000  Psych Admission Type (Psych Patients Only)  Admission Status Voluntary  Psychosocial Assessment  Patient Complaints None  Eye Contact Fair  Facial Expression Animated  Affect Appropriate to circumstance  Speech Logical/coherent  Interaction Assertive  Motor Activity Other (Comment) (WDL)  Appearance/Hygiene Poor hygiene  Behavior Characteristics Cooperative;Appropriate to situation  Mood Pleasant  Thought Process  Coherency WDL  Content WDL  Delusions None reported or observed  Perception WDL  Hallucination None reported or observed  Judgment Limited  Confusion None  Danger to Self  Current suicidal ideation? Denies  Agreement Not to Harm Self Yes  Description of Agreement verbal  Danger to Others  Danger to Others None reported or observed

## 2023-11-09 NOTE — Plan of Care (Signed)
  Problem: Education: Goal: Emotional status will improve Outcome: Progressing Goal: Verbalization of understanding the information provided will improve Outcome: Progressing   Problem: Activity: Goal: Interest or engagement in activities will improve Outcome: Progressing   Problem: Coping: Goal: Ability to verbalize frustrations and anger appropriately will improve Outcome: Progressing   Problem: Health Behavior/Discharge Planning: Goal: Identification of resources available to assist in meeting health care needs will improve Outcome: Progressing Goal: Compliance with treatment plan for underlying cause of condition will improve Outcome: Progressing   Problem: Physical Regulation: Goal: Ability to maintain clinical measurements within normal limits will improve Outcome: Progressing   Problem: Safety: Goal: Periods of time without injury will increase Outcome: Progressing

## 2023-11-09 NOTE — Group Note (Signed)
 Recreation Therapy Group Note   Group Topic:Coping Skills  Group Date: 11/09/2023 Start Time: 1535 End Time: 1610 Facilitators: Celestia Jeoffrey BRAVO, LRT, CTRS Location: Craft Room  Group Description: Mind Map.  Patient was provided a blank template of a diagram with 32 blank boxes in a tiered system, branching from the center (similar to a bubble chart). LRT directed patients to label the middle of the diagram Coping Skills. LRT and patients then came up with 8 different coping skills as examples. Pt were directed to record their coping skills in the 2nd tier boxes closest to the center.  Patients would then share their coping skills with the group as LRT wrote them out. LRT gave a handout of 99 different coping skills at the end of group.   Goal Area(s) Addressed: Patients will be able to define "coping skills". Patient will identify new coping skills.  Patient will increase communication.   Affect/Mood: N/A   Participation Level: Did not attend    Clinical Observations/Individualized Feedback: Patient did not attend group.   Plan: Continue to engage patient in RT group sessions 2-3x/week.   Jeoffrey BRAVO Celestia, LRT, CTRS 11/09/2023 5:52 PM

## 2023-11-09 NOTE — Progress Notes (Signed)
   11/09/23 0900  Psych Admission Type (Psych Patients Only)  Admission Status Voluntary  Psychosocial Assessment  Patient Complaints None  Eye Contact Fair  Facial Expression Animated  Affect Appropriate to circumstance  Speech Logical/coherent  Interaction Assertive  Motor Activity Slow  Appearance/Hygiene Improved  Behavior Characteristics Cooperative;Appropriate to situation  Mood Pleasant  Thought Process  Coherency WDL  Content WDL  Delusions None reported or observed  Perception WDL  Hallucination None reported or observed  Judgment Impaired  Confusion None  Danger to Self  Current suicidal ideation? Denies  Agreement Not to Harm Self Yes  Description of Agreement verbal  Danger to Others  Danger to Others None reported or observed   Patient rated her depression and anxiety 0/10. Looking forward for discharge tomorrow.

## 2023-11-09 NOTE — BHH Counselor (Signed)
 CSW met with pt to discuss discharge/aftercare plans. She reported plans to return home but stated that she would need assistance with transportation. Pt shared that she is seen for psychiatric services through [facility]. She stated that she needs an XL top for discharge. Pt reported that she smokes cigarettes but declined interest in cessation services. She denied any use of substances or need for substance use specific services. No other concerns expressed. Contact ended without incident.   CSW assisted pt with contacting [facility] to scheduled follow up appointment. Appointment scheduled for tomorrow, 11/10/23 at 5:40 PM.   CSW assisted pt with contacting [facility] regarding her appointment tomorrow. Pt was able to reschedule appointment until 2 PM instead of 10AM. No other concerns expressed. Contact ended without incident.   Nadara SAUNDERS. Chaim, MSW, LCSW, LCAS 11/09/2023 3:55 PM

## 2023-11-09 NOTE — Progress Notes (Signed)
 Pt calm and pleasant during assessment denying SI/HI/AVH. Pt observed by this Clinical research associate interacting appropriately with staff and peers on the unit. Pt scheduled to D/C tomorrow. Pt compliant with medication administration per MD orders. Pt given education, support, and encouragement to be active in her treatment plan. Pt being monitored Q 15 minutes for safety per unit protocol, remains safe on the unit

## 2023-11-09 NOTE — Plan of Care (Signed)
   Problem: Education: Goal: Knowledge of [facility] Education information/materials will improve Outcome: Progressing Goal: Emotional status will improve Outcome: Progressing Goal: Mental status will improve Outcome: Progressing Goal: Verbalization of understanding the information provided will improve Outcome: Progressing   Problem: Coping: Goal: Ability to verbalize frustrations and anger appropriately will improve Outcome: Progressing Goal: Ability to demonstrate self-control will improve Outcome: Progressing

## 2023-11-09 NOTE — Group Note (Signed)
 Date:  11/09/2023 Time:  9:00 PM  Group Topic/Focus:  Wrap-Up Group:   The focus of this group is to help patients review their daily goal of treatment and discuss progress on daily workbooks.    Participation Level:  Active  Participation Quality:  Appropriate  Affect:  Appropriate  Cognitive:  Appropriate  Insight: Appropriate  Engagement in Group:  Engaged  Modes of Intervention:  Discussion and Education  Additional Comments:    Natalie Edwards 11/09/2023, 9:00 PM

## 2023-11-09 NOTE — Group Note (Signed)
 Date:  11/09/2023 Time:  10:44 AM  Group Topic/Focus:  Goals Group:   The focus of this group is to help patients establish daily goals to achieve during treatment and discuss how the patient can incorporate goal setting into their daily lives to aide in recovery.    Participation Level:  Active  Participation Quality:  Appropriate  Affect:  Appropriate  Cognitive:  Alert  Insight: Appropriate  Engagement in Group:  Engaged  Modes of Intervention:  Activity, Discussion, and Education  Additional Comments:    Natalie Edwards Bennett 11/09/2023, 10:44 AM

## 2023-11-09 NOTE — [facility] Suicide Risk Assessment (Signed)
 [facility] Discharge Suicide Risk Assessment   Principal Problem: <principal problem not specified> Discharge Diagnoses: Active Problems:   MDD (major depressive disorder), recurrent severe, without psychosis (HCC)   Total Time spent with patient: 30 minutes  Musculoskeletal: Strength & Muscle Tone: within normal limits Gait & Station: normal Patient leans: N/A  Psychiatric Specialty Exam  Presentation  General Appearance:  Appropriate for Environment; Casual  Eye Contact: Fair  Speech: Clear and Coherent  Speech Volume: Normal  Handedness: Right   Mood and Affect  Mood: Euthymic  Duration of Depression Symptoms: No data recorded Affect: Appropriate   Thought Process  Thought Processes: Coherent  Descriptions of Associations:Intact  Orientation:Full (Time, Place and Person)  Thought Content:Logical  History of Schizophrenia/Schizoaffective disorder:No  Duration of Psychotic Symptoms:No data recorded Hallucinations:No data recorded Ideas of Reference:None  Suicidal Thoughts:No data recorded Homicidal Thoughts:No data recorded  Sensorium  Memory: Recent Fair; Immediate Fair; Remote Fair  Judgment: Fair  Insight: Fair   Art therapist  Concentration: Fair  Attention Span: Fair  Recall: [employer] of Knowledge: Fair  Language: Fair   Psychomotor Activity  Psychomotor Activity:No data recorded  Assets  Assets: Communication Skills; Desire for Improvement; Physical Health   Sleep  Sleep:No data recorded Estimated Sleeping Duration (Last 24 Hours): 7.50-8.50 hours  Physical Exam: Physical Exam Vitals and nursing note reviewed.    ROS Blood pressure (!) 143/66, pulse 70, temperature (!) 97.3 F (36.3 C), resp. rate 17, height 5' 4 (1.626 m), weight 84.6 kg, SpO2 100%. Body mass index is 32.01 kg/m.  Mental Status Per Nursing Assessment::   On Admission:  NA  Demographic Factors:  Low socioeconomic  status  Loss Factors: Decrease in vocational status  Historical Factors: Impulsivity  Risk Reduction Factors:   Living with another person, especially a relative, Positive social support, Positive therapeutic relationship, and Positive coping skills or problem solving skills  Continued Clinical Symptoms:  Depression:   Impulsivity  Cognitive Features That Contribute To Risk:  None    Suicide Risk:  Minimal: No identifiable suicidal ideation.  Patients presenting with no risk factors but with morbid ruminations; may be classified as minimal risk based on the severity of the depressive symptoms   Follow-up Information     [facility] Follow up.   Why: Your appointment is scheduled for 11/10/23 at 5:40 PM. Contact information: [address] [place], [place] [address] Phone: [phone] Fax: [phone]                Plan Of Care/Follow-up recommendations:  Activity:  As tolerated  Allyn Foil, MD 11/09/2023, 9:26 PM

## 2023-11-09 NOTE — Group Note (Signed)
 LCSW Group Therapy Note   Group Date: 11/09/2023 Start Time: 1300 End Time: 1400   Type of Therapy and Topic:  Group Therapy: Challenging Core Beliefs  Participation Level:  Did Not Attend  Description of Group:  Patients were educated about core beliefs and asked to identify one harmful core belief that they have. Patients were asked to explore from where those beliefs originate. Patients were asked to discuss how those beliefs make them feel and the resulting behaviors of those beliefs. They were then be asked if those beliefs are true and, if so, what evidence they have to support them. Lastly, group members were challenged to replace those negative core beliefs with helpful beliefs.   Therapeutic Goals:   1. Patient will identify harmful core beliefs and explore the origins of such beliefs. 2. Patient will identify feelings and behaviors that result from those core beliefs. 3. Patient will discuss whether such beliefs are true. 4.  Patient will replace harmful core beliefs with helpful ones.  Summary of Patient Progress:  Patient did not attend.   Therapeutic Modalities: Cognitive Behavioral Therapy; Solution-Focused Therapy   Natalie Edwards M Cap Massi, LCSWA 11/09/2023  2:09 PM

## 2023-11-09 NOTE — Group Note (Signed)
 Recreation Therapy Group Note   Group Topic:General Recreation  Group Date: 11/09/2023 Start Time: 1040 End Time: 1140 Facilitators: Celestia Jeoffrey BRAVO, LRT, CTRS Location: Courtyard  Group Description: [employer]. LRT and patients played games of basketball, drew with chalk, and played corn hole while outside in the courtyard while getting fresh air and sunlight. Music was being played in the background. LRT and peers conversed about different games they have played before, what they do in their free time and anything else that is on their minds. LRT encouraged pts to drink water after being outside, sweating and getting their heart rate up.  Goal Area(s) Addressed: Patient will build on frustration tolerance skills. Patients will partake in a competitive play game with peers. Patients will gain knowledge of new leisure interest/hobby.    Affect/Mood: Appropriate   Participation Level: Active   Participation Quality: Independent   Behavior: Appropriate   Speech/Thought Process: Coherent   Insight: Good   Judgement: Good   Modes of Intervention: Activity   Patient Response to Interventions:  Receptive   Education Outcome:  Acknowledges education   Clinical Observations/Individualized Feedback: Yuliza was active in their participation of session activities and group discussion. Pt interacted well with LRT and peers duration of session.    Plan: Continue to engage patient in RT group sessions 2-3x/week.   [address], LRT, CTRS 11/09/2023 1:39 PM

## 2023-11-09 NOTE — Progress Notes (Signed)
 [facility] MD Progress Note  11/09/2023 9:27 PM Natalie Edwards  MRN:  [id number]   Patient is a 58 year old female admitted following intentional overdose attempt. She presented to the emergency department after being found at home by her son, who reported ingestion of an unknown type and amount of pills.  Reportedly, symptoms of depression have worsened since recent surgery related to pain and increased isolation. She reports prior to event she had thoughts to bring herself into for mental health care and did not. She reports son is supportive   Subjective:  Chart reviewed, case discussed in multidisciplinary meeting, patient seen during rounds.  On interview patient is noted to be calm and cooperative.  She reports that she is currently living by herself in an apartment and after the surgery her son came to stay with her for 2 weeks but she reportedly sent him away thinking she can manage herself and was feeling guilty about having her son come by for her.  Patient then reports feeling isolated and frustrated/overwhelmed with pain.  Patient reports having history of diverticulitis and tried multiple antibiotics with no relief and so eventually decided for surgery.  Patient reports that her expectation of her surgery that she will be able to recover very quickly and able to eat food better but when she noticed that her appetite did not return and was able to eat only Jell-O patient got very frustrated and overwhelmed that led up to the overdose.  Patient denies current SI/HI/plan and denies hallucinations.  Patient denies feeling depressed or anxious at this time.  Patient reports that her son is ready to come stay with her for few days.  Patient also reports being connected to her outpatient mental health services and is reportedly doing well on the current medications.  Patient is requesting for discharge and wants more intensive therapy services as outpatient    Sleep: Good  Appetite:  Good  Past  Psychiatric History: see h&P Family History:  Family History  Problem Relation Age of Onset   Cancer Maternal Uncle        Lung   Cancer Maternal Uncle        Lung   Cancer Maternal Uncle        Lung   Headache Neg Hx        I don't think so   Migraines Neg Hx        I don't think so   Social History:  Social History   Substance and Sexual Activity  Alcohol Use Yes   Comment: occasionally - last drink was July 4, 1 shot     Social History   Substance and Sexual Activity  Drug Use No    Social History   Socioeconomic History   Marital status: Single    Spouse name: Not on file   Number of children: 3   Years of education: Not on file   Highest education level: 12th grade  Occupational History   Occupation: environmental services at [employer]  Tobacco Use   Smoking status: Every Day    Current packs/day: 0.50    Average packs/day: 0.5 packs/day for 25.0 years (12.5 ttl pk-yrs)    Types: Cigarettes   Smokeless tobacco: Never   Tobacco comments:    Pt tried to quit - helps with anxiety     1 pack last patient 3 days   Vaping Use   Vaping status: Never Used  Substance and Sexual Activity   Alcohol use: Yes  Comment: occasionally - last drink was July 4, 1 shot   Drug use: No   Sexual activity: Yes    Comment: hysterectomy  Other Topics Concern   Not on file  Social History Narrative   Lives at home in an apartment. Her mother lives with her.    Right handed   Caffeine: drinks approx. 36 oz of pepsi per day. Sometimes drinks 2 cups of coffee in a day as well.    Social Drivers of Health   Financial Resource Strain: Low Risk  (03/06/2023)   Overall Financial Resource Strain (CARDIA)    Difficulty of Paying Living Expenses: Not very hard  Food Insecurity: Food Insecurity Present (11/04/2023)   Hunger Vital Sign    Worried About Running Out of Food in the Last Year: Never true    Ran Out of Food in the Last Year: Sometimes true  Transportation Needs:  Unmet Transportation Needs (11/04/2023)   PRAPARE - Transportation    Lack of Transportation (Medical): No    Lack of Transportation (Non-Medical): Yes  Physical Activity: Sufficiently Active (05/22/2022)   Exercise Vital Sign    Days of Exercise per Week: 4 days    Minutes of Exercise per Session: 70 min  Stress: Stress Concern Present (03/06/2023)   [employer] of Occupational Health - Occupational Stress Questionnaire    Feeling of Stress : Very much  Social Connections: Socially Isolated (11/04/2023)   Social Connection and Isolation Panel    Frequency of Communication with Friends and Family: Never    Frequency of Social Gatherings with Friends and Family: Never    Attends Religious Services: Never    Database administrator or Organizations: No    Attends Engineer, structural: Never    Marital Status: Divorced   Past Medical History:  Past Medical History:  Diagnosis Date   Allergy    Anemia    Diverticulosis    Gall bladder stones 1993   Gastric ulcer    GERD (gastroesophageal reflux disease)    Hypertension    Renal mass     Past Surgical History:  Procedure Laterality Date   ABDOMINAL HYSTERECTOMY     BIOPSY  09/29/2022   Procedure: BIOPSY;  Surgeon: Shila Gustav GAILS, MD;  Location: [facility];  Service: Gastroenterology;;   CHOLECYSTECTOMY     COLONOSCOPY WITH ESOPHAGOGASTRODUODENOSCOPY (EGD)  04/07/2023   [facility]   ESOPHAGOGASTRODUODENOSCOPY (EGD) WITH PROPOFOL  N/A 09/29/2022   Procedure: ESOPHAGOGASTRODUODENOSCOPY (EGD) WITH PROPOFOL ;  Surgeon: Shila Gustav GAILS, MD;  Location: [facility];  Service: Gastroenterology;  Laterality: N/A;   LAPAROSCOPIC SIGMOID COLECTOMY N/A 10/27/2023   Procedure: COLECTOMY, SIGMOID, LAPAROSCOPIC;  Surgeon: Lyndel Deward PARAS, MD;  Location: [facility] OR;  Service: General;  Laterality: N/A;   MOBILIZATION, SPLENIC FLEXURE, WITH PARTIAL COLECTOMY N/A 10/27/2023   Procedure: MOBILIZATION, SPLENIC FLEXURE;   Surgeon: Stechschulte, Deward PARAS, MD;  Location: [facility] OR;  Service: General;  Laterality: N/A;    Current Medications: Current Facility-Administered Medications  Medication Dose Route Frequency Provider Last Rate Last Admin   acetaminophen  (TYLENOL ) tablet 650 mg  650 mg Oral Q6H PRN Mannie Jerel PARAS, NP   650 mg at 11/08/23 0803   alum & mag hydroxide-simeth (MAALOX/MYLANTA) 200-200-20 MG/5ML suspension 30 mL  30 mL Oral Q4H PRN Mannie Jerel PARAS, NP       haloperidol  (HALDOL ) tablet 5 mg  5 mg Oral TID PRN Mannie Jerel PARAS, NP       And  diphenhydrAMINE  (BENADRYL ) capsule 50 mg  50 mg Oral TID PRN Mannie Jerel PARAS, NP       haloperidol  lactate (HALDOL ) injection 5 mg  5 mg Intramuscular TID PRN Mannie Jerel PARAS, NP       And   diphenhydrAMINE  (BENADRYL ) injection 50 mg  50 mg Intramuscular TID PRN Mannie Jerel PARAS, NP       And   LORazepam  (ATIVAN ) injection 2 mg  2 mg Intramuscular TID PRN Mannie Jerel PARAS, NP       haloperidol  lactate (HALDOL ) injection 10 mg  10 mg Intramuscular TID PRN Mannie Jerel PARAS, NP       And   diphenhydrAMINE  (BENADRYL ) injection 50 mg  50 mg Intramuscular TID PRN Mannie Jerel PARAS, NP       And   LORazepam  (ATIVAN ) injection 2 mg  2 mg Intramuscular TID PRN Mannie Jerel PARAS, NP       feeding supplement (ENSURE PLUS HIGH PROTEIN) liquid 237 mL  237 mL Oral BID BM Cleotilde Hoy HERO, NP   237 mL at 11/09/23 1022   FLUoxetine  (PROZAC ) capsule 40 mg  40 mg Oral Daily Cleotilde Hoy HERO, NP   40 mg at 11/09/23 0820   magnesium  hydroxide (MILK OF MAGNESIA) suspension 30 mL  30 mL Oral Daily PRN Mannie Jerel PARAS, NP       methocarbamol  (ROBAXIN ) tablet 750 mg  750 mg Oral Q8H PRN Mannie Jerel PARAS, NP   750 mg at 11/05/23 1206   multivitamin with minerals tablet 1 tablet  1 tablet Oral Daily Aldrich Lloyd, MD   1 tablet at 11/09/23 0820   nicotine  (NICODERM CQ  - dosed in mg/24 hours) patch 14 mg  14 mg Transdermal Q0600 Mannie Jerel PARAS, NP   14 mg at 11/09/23 0522    oxyCODONE  (Oxy IR/ROXICODONE ) immediate release tablet 5 mg  5 mg Oral Q4H PRN Mannie Jerel PARAS, NP   5 mg at 11/09/23 1634   traZODone  (DESYREL ) tablet 100 mg  100 mg Oral QHS Cleotilde Hoy HERO, NP   100 mg at 11/08/23 2113    Lab Results: No results found for this or any previous visit (from the past 48 hours).  Blood Alcohol level:  Lab Results  Component Value Date   [facility] <15 11/03/2023   ETH <15 10/07/2023    Metabolic Disorder Labs: Lab Results  Component Value Date   HGBA1C 5.2 05/22/2023   MPG 102.54 05/22/2023   No results found for: PROLACTIN Lab Results  Component Value Date   CHOL 183 05/29/2023   TRIG 87 05/29/2023   HDL 63 05/29/2023   CHOLHDL 2.9 05/29/2023   VLDL 17 05/29/2023   LDLCALC 103 (H) 05/29/2023   LDLCALC 106 (H) 12/13/2021       Psychiatric Specialty Exam: Appearance: casual, fair hygiene, improving  Eye contact: good Psychomotor activity: normal range Speech: normal rate tone volume  Mood: depressed-brighter Affect: flat Thought process: linear, goal-directed Thought content: denies SI/HI, no psychosis observed Perception: no AVH or paranoia Orientation: fully oriented Memory: intact Concentration: good Recall: intact Fund of knowledge: appropriate Language: intact Insight: fair Judgment: poor -improving  Musculoskeletal: Strength & Muscle Tone: within normal limits Gait & Station: normal Assets  Assets: Manufacturing systems engineer; Desire for Improvement; Physical Health    Physical Exam: Physical Exam Vitals and nursing note reviewed.    ROS Blood pressure (!) 143/66, pulse 70, temperature (!) 97.3 F (36.3 C), resp. rate 17, height 5' 4 (1.626 m),  weight 84.6 kg, SpO2 100%. Body mass index is 32.01 kg/m.  Diagnosis: Active Problems:   MDD (major depressive disorder), recurrent severe, without psychosis (HCC)   PLAN: Safety and Monitoring:  -- Voluntary admission to inpatient psychiatric unit for safety,  stabilization and treatment  -- Daily contact with patient to assess and evaluate symptoms and progress in treatment  -- Patient's case to be discussed in multi-disciplinary team meeting  -- Observation Level : q15 minute checks  -- Vital signs:  q12 hours  -- Precautions: suicide, elopement, and assault -- Encouraged patient to participate in unit milieu and in scheduled group therapies  2. Psychiatric Diagnoses and Treatment:   Psychiatric Medications Prozac  40 mg daily Trazodone  100 mg nightly                                        3. Medical Issues Being Addressed: ordering nutrition consult, add Ensure bid       4. Discharge Planning:   -- Social work and case management to assist with discharge planning and identification of hospital follow-up needs prior to discharge  -- Estimated LOS: 3-4 days  Allyn Foil, MD 11/09/2023, 9:27 PM

## 2023-11-10 DIAGNOSIS — F332 Major depressive disorder, recurrent severe without psychotic features: Secondary | ICD-10-CM | POA: Diagnosis not present

## 2023-11-10 NOTE — Plan of Care (Signed)
   Problem: Education: Goal: Emotional status will improve Outcome: Progressing Goal: Mental status will improve Outcome: Progressing

## 2023-11-10 NOTE — Progress Notes (Signed)
  [facility] Adult Case Management Discharge Plan :  Will you be returning to the same living situation after discharge:  Yes,  pt plans to return home. At discharge, do you have transportation home?: Yes,  pt received taxi voucher.  Do you have the ability to pay for your medications: Yes,  [employer] MEDICARE / DREMA DUAL COMPLETE  Release of information consent forms completed and in the chart;  Patient's signature needed at discharge.  Patient to Follow up at:  Follow-up Information     [facility].   Why: Your appointment is scheduled for 11/10/23 at 5:40 PM. Contact information: [address] [place], [place] [address] Phone: [phone] Fax: [phone]                Next level of care provider has access to [facility] Link:no  Safety Planning and Suicide Prevention discussed: Yes,  SPE completed with pt.     Has patient been referred to the Quitline?: Patient refused referral for treatment  Patient has been referred for addiction treatment: No known substance use disorder.  Nadara JONELLE Fam, LCSW 11/10/2023, 10:31 AM

## 2023-11-10 NOTE — Progress Notes (Signed)
 Patient pleasant and cooperative on approach. Denies SI,HI and AVH. Verbalized understanding discharge instructions,prescriptions and follow up care. All belongings returned from [employer]. Suicide safety plan filled by patient and placed in chart. Copy given to patient.Patient escorted out by staff and transported by cab.

## 2023-11-10 NOTE — Care Management Important Message (Signed)
 Important Message  Patient Details  Name: Natalie Edwards MRN: [id number] Date of Birth: 04/17/1965   Medicare Important Message Given:  Yes - Medicare IM     Nadara JONELLE Fam, LCSW 11/10/2023, 10:37 AM

## 2023-11-11 NOTE — Discharge Summary (Signed)
 Physician Discharge Summary Note  Patient:  Natalie Edwards is an 58 y.o., female MRN:  [id number] DOB:  [birth year] Patient phone:  [phone] (home)  Patient address:   [address] [place] [address],   Total time spent: 40 min Date of Admission:  11/04/2023 Date of Discharge: 11/10/23  Reason for Admission:  Patient is a 58 year old female admitted following intentional overdose attempt. She presented to the emergency department after being found at home by her son, who reported ingestion of an unknown type and amount of pills. Patient is admitted to adult psych unit with Q15 min safety monitoring. Multidisciplinary team approach is offered. Medication management; group/milieu therapy is offered.   Principal Problem: <principal problem not specified> Discharge Diagnoses: Active Problems:   MDD (major depressive disorder), recurrent severe, without psychosis (HCC)   Past Psychiatric History: see h&p  Family Psychiatric  History: see h&p Social History:  Social History   Substance and Sexual Activity  Alcohol Use Yes   Comment: occasionally - last drink was July 4, 1 shot     Social History   Substance and Sexual Activity  Drug Use No    Social History   Socioeconomic History   Marital status: Single    Spouse name: Not on file   Number of children: 3   Years of education: Not on file   Highest education level: 12th grade  Occupational History   Occupation: environmental services at [employer]  Tobacco Use   Smoking status: Every Day    Current packs/day: 0.50    Average packs/day: 0.5 packs/day for 25.0 years (12.5 ttl pk-yrs)    Types: Cigarettes   Smokeless tobacco: Never   Tobacco comments:    Pt tried to quit - helps with anxiety     1 pack last patient 3 days   Vaping Use   Vaping status: Never Used  Substance and Sexual Activity   Alcohol use: Yes    Comment: occasionally - last drink was July 4, 1 shot   Drug use: No   Sexual activity:  Yes    Comment: hysterectomy  Other Topics Concern   Not on file  Social History Narrative   Lives at home in an apartment. Her mother lives with her.    Right handed   Caffeine: drinks approx. 36 oz of pepsi per day. Sometimes drinks 2 cups of coffee in a day as well.    Social Drivers of Corporate investment banker Strain: Low Risk  (03/06/2023)   Overall Financial Resource Strain (CARDIA)    Difficulty of Paying Living Expenses: Not very hard  Food Insecurity: Food Insecurity Present (11/04/2023)   Hunger Vital Sign    Worried About Running Out of Food in the Last Year: Never true    Ran Out of Food in the Last Year: Sometimes true  Transportation Needs: Unmet Transportation Needs (11/04/2023)   PRAPARE - Transportation    Lack of Transportation (Medical): No    Lack of Transportation (Non-Medical): Yes  Physical Activity: Sufficiently Active (05/22/2022)   Exercise Vital Sign    Days of Exercise per Week: 4 days    Minutes of Exercise per Session: 70 min  Stress: Stress Concern Present (03/06/2023)   [employer] of Occupational Health - Occupational Stress Questionnaire    Feeling of Stress : Very much  Social Connections: Socially Isolated (11/04/2023)   Social Connection and Isolation Panel    Frequency of Communication with Friends and Family: Never  Frequency of Social Gatherings with Friends and Family: Never    Attends Religious Services: Never    Database administrator or Organizations: No    Attends Engineer, structural: Never    Marital Status: Divorced   Past Medical History:  Past Medical History:  Diagnosis Date   Allergy    Anemia    Diverticulosis    Gall bladder stones 1993   Gastric ulcer    GERD (gastroesophageal reflux disease)    Hypertension    Renal mass     Past Surgical History:  Procedure Laterality Date   ABDOMINAL HYSTERECTOMY     BIOPSY  09/29/2022   Procedure: BIOPSY;  Surgeon: Shila Gustav GAILS, MD;  Location: [facility]  ENDOSCOPY;  Service: Gastroenterology;;   CHOLECYSTECTOMY     COLONOSCOPY WITH ESOPHAGOGASTRODUODENOSCOPY (EGD)  04/07/2023   [facility]   ESOPHAGOGASTRODUODENOSCOPY (EGD) WITH PROPOFOL  N/A 09/29/2022   Procedure: ESOPHAGOGASTRODUODENOSCOPY (EGD) WITH PROPOFOL ;  Surgeon: Shila Gustav GAILS, MD;  Location: [facility] ENDOSCOPY;  Service: Gastroenterology;  Laterality: N/A;   LAPAROSCOPIC SIGMOID COLECTOMY N/A 10/27/2023   Procedure: COLECTOMY, SIGMOID, LAPAROSCOPIC;  Surgeon: Lyndel Deward PARAS, MD;  Location: [facility] OR;  Service: General;  Laterality: N/A;   MOBILIZATION, SPLENIC FLEXURE, WITH PARTIAL COLECTOMY N/A 10/27/2023   Procedure: MOBILIZATION, SPLENIC FLEXURE;  Surgeon: Stechschulte, Deward PARAS, MD;  Location: [facility] OR;  Service: General;  Laterality: N/A;   Family History:  Family History  Problem Relation Age of Onset   Cancer Maternal Uncle        Lung   Cancer Maternal Uncle        Lung   Cancer Maternal Uncle        Lung   Headache Neg Hx        I don't think so   Migraines Neg Hx        I don't think so    Hospital Course:  Patient is a 58 year old female admitted following intentional overdose attempt. She presented to the emergency department after being found at home by her son, who reported ingestion of an unknown type and amount of pills. Patient is admitted to adult psych unit with Q15 min safety monitoring. Multidisciplinary team approach is offered. Medication management; group/milieu therapy is offered.  Detailed risk assessment is complete based on clinical exam and individual risk factors and acute suicide risk is low and acute violence risk is low.    On admission,Patient was on Prozac  which was increased to 40 mg daily and trazodone  at 100 mg at bedtime for insomnia.  Patient maintained safe behaviors on the unit.  She participated in groups.  On the day of discharge she remained future oriented and is willing to engage in outpatient mental health services.  She  consistently denied SI/HI/plan on the day of discharge. Currently, all modifiable risk of harm to self/harm to others have been addressed and patient is no longer appropriate for the acute inpatient setting and is able to continue treatment for mental health needs in the community with the supports as indicated below.  Patient is educated and verbalized understanding of discharge plan of care including medications, follow-up appointments, mental health resources and further crisis services in the community.  He is instructed to call 911 or present to the nearest emergency room should he experience any decompensation in mood, disturbance of bowel or return of suicidal/homicidal ideations.  Patient verbalizes understanding of this education and agrees to this plan of care  Physical Findings: AIMS:  , ,  ,  ,  CIWA:    COWS:        Psychiatric Specialty Exam:  Presentation  General Appearance:  Appropriate for Environment; Casual  Eye Contact: Fair  Speech: Clear and Coherent  Speech Volume: Normal    Mood and Affect  Mood: Euthymic  Affect: Appropriate   Thought Process  Thought Processes: Coherent  Descriptions of Associations:Intact  Orientation:Full (Time, Place and Person)  Thought Content:Logical  Hallucinations:Hallucinations: None  Ideas of Reference:None  Suicidal Thoughts:Suicidal Thoughts: No  Homicidal Thoughts:Homicidal Thoughts: No   Sensorium  Memory: Immediate Fair; Recent Fair; Remote Fair  Judgment: Fair  Insight: Fair   Art therapist  Concentration: Fair  Attention Span: Fair  Recall: Fair  Fund of Knowledge: Fair  Language: Fair   Psychomotor Activity  Psychomotor Activity:Psychomotor Activity: Normal  Musculoskeletal: Strength & Muscle Tone: within normal limits Gait & Station: normal Assets  Assets: Desire for Improvement   Sleep  Sleep:Sleep: Fair    Physical Exam: Physical Exam ROS Blood  pressure (!) 142/73, pulse 83, temperature 98.1 F (36.7 C), resp. rate 20, height 5' 4 (1.626 m), weight 84.6 kg, SpO2 97%. Body mass index is 32.01 kg/m.   Social History   Tobacco Use  Smoking Status Every Day   Current packs/day: 0.50   Average packs/day: 0.5 packs/day for 25.0 years (12.5 ttl pk-yrs)   Types: Cigarettes  Smokeless Tobacco Never  Tobacco Comments   Pt tried to quit - helps with anxiety    1 pack last patient 3 days    Tobacco Cessation:  A prescription for an FDA-approved tobacco cessation medication provided at discharge   Blood Alcohol level:  Lab Results  Component Value Date   [facility] <15 11/03/2023   ETH <15 10/07/2023    Metabolic Disorder Labs:  Lab Results  Component Value Date   HGBA1C 5.2 05/22/2023   MPG 102.54 05/22/2023   No results found for: PROLACTIN Lab Results  Component Value Date   CHOL 183 05/29/2023   TRIG 87 05/29/2023   HDL 63 05/29/2023   CHOLHDL 2.9 05/29/2023   VLDL 17 05/29/2023   LDLCALC 103 (H) 05/29/2023   LDLCALC 106 (H) 12/13/2021    See Psychiatric Specialty Exam and Suicide Risk Assessment completed by Attending Physician prior to discharge.  Discharge destination:  Home  Is patient on multiple antipsychotic therapies at discharge:  No   Has Patient had three or more failed trials of antipsychotic monotherapy by history:  No  Recommended Plan for Multiple Antipsychotic Therapies: NA   Allergies as of 11/10/2023       Reactions   Hydroxyzine  Palpitations, Other (See Comments)   Jitteriness, too   Nsaids Other (See Comments)   History of ulcers   Ambien  [zolpidem  Tartrate] Other (See Comments)   Jitteriness, nervousness, abdominal pain   Ibuprofen  Other (See Comments)   History of ulcers        Medication List     STOP taking these medications    acetaminophen  500 MG tablet Commonly known as: TYLENOL    busPIRone  7.5 MG tablet Commonly known as: BUSPAR    dicyclomine  20 MG  tablet Commonly known as: BENTYL    hydrOXYzine  50 MG tablet Commonly known as: ATARAX    mirtazapine  45 MG tablet Commonly known as: REMERON    nicotine  21 mg/24hr patch Commonly known as: NICODERM CQ  - dosed in mg/24 hours Replaced by: nicotine  14 mg/24hr patch   ondansetron  4 MG disintegrating tablet Commonly known as: ZOFRAN -ODT   pregabalin  75 MG capsule Commonly  known as: LYRICA    propranolol  10 MG tablet Commonly known as: INDERAL    risperiDONE  1 MG tablet Commonly known as: RISPERDAL    temazepam  15 MG capsule Commonly known as: RESTORIL        TAKE these medications      Indication  FLUoxetine  40 MG capsule Commonly known as: PROZAC  Take 1 capsule (40 mg total) by mouth daily. What changed:  medication strength how much to take  Indication: Generalized Anxiety Disorder   methocarbamol  750 MG tablet Commonly known as: ROBAXIN  Take 1 tablet (750 mg total) by mouth every 8 (eight) hours as needed for muscle spasms.  Indication: Musculoskeletal Pain   multivitamin with minerals Tabs tablet Take 1 tablet by mouth daily.  Indication: 21-Hydroxylase Deficiency   nicotine  14 mg/24hr patch Commonly known as: NICODERM CQ  - dosed in mg/24 hours Place 1 patch (14 mg total) onto the skin daily at 6 (six) AM. Replaces: nicotine  21 mg/24hr patch  Indication: Nicotine  Addiction   oxyCODONE  5 MG immediate release tablet Commonly known as: Oxy IR/ROXICODONE  Take 1-2 tablets (5-10 mg total) by mouth every 4 (four) hours as needed for moderate pain (pain score 4-6) or severe pain (pain score 7-10).  Indication: Acute Pain   pantoprazole  40 MG tablet Commonly known as: PROTONIX  Take 1 tablet (40 mg total) by mouth 2 (two) times daily.  Indication: Gastroesophageal Reflux Disease   traZODone  100 MG tablet Commonly known as: DESYREL  Take 1 tablet (100 mg total) by mouth at bedtime.  Indication: Trouble Sleeping        Follow-up Information     Transitions  Therapeutic Care Follow up.   Why: Your appointment is scheduled for 11/10/23 at 5:40 PM. Contact information: [address] [place] [place], [place] [address] Phone: [phone] Fax: [phone]                Follow-up recommendations:  Activity:  As tolerated    Signed: Sisto Granillo, MD 11/11/2023, 12:40 PM

## 2023-11-16 ENCOUNTER — Emergency Department (HOSPITAL_COMMUNITY)
Admission: EM | Admit: 2023-11-16 | Discharge: 2023-11-17 | Disposition: A | Discharge status: Home / Self Care | Attending: Emergency Medicine | Admitting: Emergency Medicine

## 2023-11-16 ENCOUNTER — Other Ambulatory Visit: Payer: Self-pay

## 2023-11-16 ENCOUNTER — Emergency Department (HOSPITAL_COMMUNITY)

## 2023-11-16 DIAGNOSIS — R109 Unspecified abdominal pain: Secondary | ICD-10-CM

## 2023-11-16 DIAGNOSIS — R1114 Bilious vomiting: Secondary | ICD-10-CM | POA: Insufficient documentation

## 2023-11-16 DIAGNOSIS — R1084 Generalized abdominal pain: Secondary | ICD-10-CM | POA: Diagnosis not present

## 2023-11-16 DIAGNOSIS — D72829 Elevated white blood cell count, unspecified: Secondary | ICD-10-CM | POA: Diagnosis not present

## 2023-11-16 LAB — CBC WITH DIFFERENTIAL/PLATELET
Abs Immature Granulocytes: 0.06 K/uL (ref 0.00–0.07)
Basophils Absolute: 0 K/uL (ref 0.0–0.1)
Basophils Relative: 0 %
Eosinophils Absolute: 0 K/uL (ref 0.0–0.5)
Eosinophils Relative: 0 %
HCT: 35.2 % — ABNORMAL LOW (ref 36.0–46.0)
Hemoglobin: 11.4 g/dL — ABNORMAL LOW (ref 12.0–15.0)
Immature Granulocytes: 1 %
Lymphocytes Relative: 15 %
Lymphs Abs: 1.8 K/uL (ref 0.7–4.0)
MCH: 25.5 pg — ABNORMAL LOW (ref 26.0–34.0)
MCHC: 32.4 g/dL (ref 30.0–36.0)
MCV: 78.7 fL — ABNORMAL LOW (ref 80.0–100.0)
Monocytes Absolute: 0.4 K/uL (ref 0.1–1.0)
Monocytes Relative: 3 %
Neutro Abs: 9.9 K/uL — ABNORMAL HIGH (ref 1.7–7.7)
Neutrophils Relative %: 81 %
Platelets: 676 K/uL — ABNORMAL HIGH (ref 150–400)
RBC: 4.47 MIL/uL (ref 3.87–5.11)
RDW: 18.3 % — ABNORMAL HIGH (ref 11.5–15.5)
WBC: 12.3 K/uL — ABNORMAL HIGH (ref 4.0–10.5)
nRBC: 0 % (ref 0.0–0.2)

## 2023-11-16 LAB — LACTIC ACID, PLASMA: Lactic Acid, Venous: 0.9 mmol/L (ref 0.5–1.9)

## 2023-11-16 LAB — COMPREHENSIVE METABOLIC PANEL WITH GFR
ALT: 19 U/L (ref 0–44)
AST: 19 U/L (ref 15–41)
Albumin: 3.8 g/dL (ref 3.5–5.0)
Alkaline Phosphatase: 55 U/L (ref 38–126)
Anion gap: 16 — ABNORMAL HIGH (ref 5–15)
BUN: 5 mg/dL — ABNORMAL LOW (ref 6–20)
CO2: 19 mmol/L — ABNORMAL LOW (ref 22–32)
Calcium: 9.3 mg/dL (ref 8.9–10.3)
Chloride: 103 mmol/L (ref 98–111)
Creatinine, Ser: 1.17 mg/dL — ABNORMAL HIGH (ref 0.44–1.00)
GFR, Estimated: 54 mL/min — ABNORMAL LOW (ref >60)
Glucose, Bld: 118 mg/dL — ABNORMAL HIGH (ref 70–99)
Potassium: 3.5 mmol/L (ref 3.5–5.1)
Sodium: 138 mmol/L (ref 135–145)
Total Bilirubin: 0.6 mg/dL (ref 0.0–1.2)
Total Protein: 7.4 g/dL (ref 6.5–8.1)

## 2023-11-16 MED ORDER — SODIUM CHLORIDE 0.9 % IV BOLUS
500.0000 mL | Freq: Once | INTRAVENOUS | Status: AC
  Administered 2023-11-16: 500 mL via INTRAVENOUS

## 2023-11-16 MED ORDER — MORPHINE SULFATE (PF) 4 MG/ML IV SOLN
4.0000 mg | Freq: Once | INTRAVENOUS | Status: AC
  Administered 2023-11-16: 4 mg via INTRAVENOUS
  Filled 2023-11-16: qty 1

## 2023-11-16 MED ORDER — IOHEXOL 350 MG/ML SOLN
75.0000 mL | Freq: Once | INTRAVENOUS | Status: AC | PRN
  Administered 2023-11-16: 75 mL via INTRAVENOUS

## 2023-11-16 MED ORDER — ONDANSETRON HCL 4 MG/2ML IJ SOLN
4.0000 mg | Freq: Once | INTRAMUSCULAR | Status: AC
  Administered 2023-11-16: 4 mg via INTRAVENOUS
  Filled 2023-11-16: qty 2

## 2023-11-16 MED ORDER — HYDROMORPHONE HCL 1 MG/ML IJ SOLN
1.0000 mg | Freq: Once | INTRAMUSCULAR | Status: AC
  Administered 2023-11-16: 1 mg via INTRAVENOUS
  Filled 2023-11-16: qty 1

## 2023-11-16 NOTE — ED Triage Notes (Signed)
 Pt BIB [facility] from home. Pt has been having abdominal pain since diverticulitis surgery in Aug, pt reports staples were taken out last Tues. She reports she has not been eating since Friday. Endorses a sharp pain in both lower quadrants. Endorses nausea, denies v/d.   EMS 180/100BP 76P 18R 97%RA 131 cbg

## 2023-11-16 NOTE — ED Notes (Signed)
 Patient back from CT

## 2023-11-16 NOTE — ED Notes (Signed)
Attempted x2 for PIV, unsuccessful.

## 2023-11-16 NOTE — ED Provider Notes (Signed)
 [place] EMERGENCY DEPARTMENT AT [facility] Provider Note   CSN: [id number] Arrival date & time: 11/16/23  1704     Patient presents with: Abdominal Pain   Natalie Edwards is a 58 y.o. female.   HPI   59 year old female with recent laparoscopic sigmoid colectomy with reanastomosis and concern for small bowel ischemia secondary to adhesions on 8/12 with Dr. Lyndel presents to the emergency department with worsening abdominal pain.  Patient has had chronic abdominal pain since the surgery but states on Saturday the pain worsened, primarily in the lower quadrants.  She is now experiencing decreased appetite, bilious emesis.  Last bowel movement was yesterday, small but normal for her.  She continues to pass gas.  Denies any acute fever or genitourinary symptoms.  Prior to Admission medications   Medication Sig Start Date End Date Taking? Authorizing Provider  FLUoxetine  (PROZAC ) 40 MG capsule Take 1 capsule (40 mg total) by mouth daily. 11/10/23   Donnelly Mellow, MD  methocarbamol  (ROBAXIN ) 750 MG tablet Take 1 tablet (750 mg total) by mouth every 8 (eight) hours as needed for muscle spasms. 10/30/23   Tammy Sor, PA-C  Multiple Vitamin (MULTIVITAMIN WITH MINERALS) TABS tablet Take 1 tablet by mouth daily. 11/10/23   Jadapalle, Sree, MD  nicotine  (NICODERM CQ  - DOSED IN MG/24 HOURS) 14 mg/24hr patch Place 1 patch (14 mg total) onto the skin daily at 6 (six) AM. 11/10/23   Donnelly Mellow, MD  oxyCODONE  (OXY IR/ROXICODONE ) 5 MG immediate release tablet Take 1-2 tablets (5-10 mg total) by mouth every 4 (four) hours as needed for moderate pain (pain score 4-6) or severe pain (pain score 7-10). 10/30/23   Tammy Sor, PA-C  pantoprazole  (PROTONIX ) 40 MG tablet Take 1 tablet (40 mg total) by mouth 2 (two) times daily. Patient not taking: Reported on 10/23/2023 09/02/23   May, Deanna J, NP  traZODone  (DESYREL ) 100 MG tablet Take 1 tablet (100 mg total) by mouth at bedtime. 11/09/23    Donnelly Mellow, MD    Allergies: Hydroxyzine , Nsaids, Ambien  [zolpidem  tartrate], and Ibuprofen     Review of Systems  Constitutional:  Positive for appetite change and fatigue. Negative for fever.  Respiratory:  Negative for shortness of breath.   Cardiovascular:  Negative for chest pain.  Gastrointestinal:  Positive for abdominal pain, nausea and vomiting. Negative for diarrhea.  Skin:  Negative for rash.  Neurological:  Negative for headaches.    Updated Vital Signs BP (!) 142/129   Pulse 60   Temp 99.4 F (37.4 C) (Oral)   Resp 14   Ht 5' 4 (1.626 m)   Wt 77.1 kg   SpO2 100%   BMI 29.18 kg/m   Physical Exam Constitutional:      Appearance: She is ill-appearing.     Comments: Emesis bag with bilious emesis  Abdominal:     General: Bowel sounds are normal. There is no distension.     Tenderness: There is generalized abdominal tenderness. There is guarding and rebound.     Comments: Laparoscopic areas are well-healing     (all labs ordered are listed, but only abnormal results are displayed) Labs Reviewed  CBC WITH DIFFERENTIAL/PLATELET  COMPREHENSIVE METABOLIC PANEL WITH GFR  LACTIC ACID, PLASMA  LACTIC ACID, PLASMA    EKG: None  Radiology: No results found.   Procedures   Medications Ordered in the ED  sodium chloride  0.9 % bolus 500 mL (has no administration in time range)  ondansetron  (ZOFRAN ) injection 4 mg (has  no administration in time range)  morphine  (PF) 4 MG/ML injection 4 mg (has no administration in time range)                                    Medical Decision Making Amount and/or Complexity of Data Reviewed Labs: ordered. Radiology: ordered.  Risk Prescription drug management.   58 year old female presents emergency department with concern for abdominal pain.  She is status post sigmoid colectomy, reanastomosis after an episode of diverticulitis.  Vitals are normal and stable.  Abdomen is diffusely tender but mainly in the  lower quadrants.  There is mild leukocytosis on blood work with a slight AKI but otherwise lactic is normal.  On initial surgery there was concern for possible small bowel ischemia secondary to adhesions.  For this reason CT was done with an angio.  There is no signs of ischemic bowel or other acute findings.  There is a small amount of free fluid but no other emergent findings.  After IV medicine patient's pain is improved.  She is been allowed to eat and drink.  Patient tolerated crackers and water.  No further vomiting.  States that her abdominal pain is improved.  Offers no new concerns or complaints.  Will plan for outpatient follow-up with general surgery.  Patient at this time appears safe and stable for discharge and close outpatient follow up. Discharge plan and strict return to ED precautions discussed, patient verbalizes understanding and agreement.     Final diagnoses:  None    ED Discharge Orders     None          Bari Roxie HERO, DO 11/16/23 2351

## 2023-11-16 NOTE — Discharge Instructions (Addendum)
 You have been seen and discharged from the emergency department.  Your CT imaging did not show any acute finding.  Continue to take your home medications as prescribed.  Follow-up with your surgeon for further evaluation.  Take home medications as prescribed. If you have any worsening symptoms or further concerns for your health please return to an emergency department for further evaluation.

## 2023-11-16 NOTE — ED Notes (Signed)
 Patient transported to CT

## 2023-11-18 ENCOUNTER — Telehealth: Payer: Self-pay

## 2023-11-18 ENCOUNTER — Encounter (HOSPITAL_COMMUNITY): Payer: Self-pay

## 2023-11-18 ENCOUNTER — Emergency Department (HOSPITAL_COMMUNITY): Admission: EM | Admit: 2023-11-18 | Discharge: 2023-11-18 | Discharge status: Against Medical Advice

## 2023-11-18 ENCOUNTER — Other Ambulatory Visit: Payer: Self-pay

## 2023-11-18 DIAGNOSIS — R109 Unspecified abdominal pain: Secondary | ICD-10-CM | POA: Diagnosis present

## 2023-11-18 DIAGNOSIS — R110 Nausea: Secondary | ICD-10-CM | POA: Insufficient documentation

## 2023-11-18 DIAGNOSIS — Z5321 Procedure and treatment not carried out due to patient leaving prior to being seen by health care provider: Secondary | ICD-10-CM | POA: Insufficient documentation

## 2023-11-18 DIAGNOSIS — R519 Headache, unspecified: Secondary | ICD-10-CM | POA: Diagnosis not present

## 2023-11-18 LAB — URINALYSIS, ROUTINE W REFLEX MICROSCOPIC
Bilirubin Urine: NEGATIVE
Glucose, UA: NEGATIVE mg/dL
Hgb urine dipstick: NEGATIVE
Ketones, ur: 20 mg/dL — AB
Leukocytes,Ua: NEGATIVE
Nitrite: NEGATIVE
Protein, ur: 100 mg/dL — AB
Specific Gravity, Urine: 1.019 (ref 1.005–1.030)
pH: 5 (ref 5.0–8.0)

## 2023-11-18 LAB — COMPREHENSIVE METABOLIC PANEL WITH GFR
ALT: 20 U/L (ref 0–44)
AST: 19 U/L (ref 15–41)
Albumin: 3.7 g/dL (ref 3.5–5.0)
Alkaline Phosphatase: 50 U/L (ref 38–126)
Anion gap: 16 — ABNORMAL HIGH (ref 5–15)
BUN: 9 mg/dL (ref 6–20)
CO2: 18 mmol/L — ABNORMAL LOW (ref 22–32)
Calcium: 9.1 mg/dL (ref 8.9–10.3)
Chloride: 101 mmol/L (ref 98–111)
Creatinine, Ser: 1.09 mg/dL — ABNORMAL HIGH (ref 0.44–1.00)
GFR, Estimated: 59 mL/min — ABNORMAL LOW (ref >60)
Glucose, Bld: 79 mg/dL (ref 70–99)
Potassium: 3.8 mmol/L (ref 3.5–5.1)
Sodium: 135 mmol/L (ref 135–145)
Total Bilirubin: 0.9 mg/dL (ref 0.0–1.2)
Total Protein: 7.3 g/dL (ref 6.5–8.1)

## 2023-11-18 LAB — LIPASE, BLOOD: Lipase: 24 U/L (ref 11–51)

## 2023-11-18 LAB — CBC
HCT: 36.6 % (ref 36.0–46.0)
Hemoglobin: 11.3 g/dL — ABNORMAL LOW (ref 12.0–15.0)
MCH: 25.6 pg — ABNORMAL LOW (ref 26.0–34.0)
MCHC: 30.9 g/dL (ref 30.0–36.0)
MCV: 82.8 fL (ref 80.0–100.0)
Platelets: 550 K/uL — ABNORMAL HIGH (ref 150–400)
RBC: 4.42 MIL/uL (ref 3.87–5.11)
RDW: 18.9 % — ABNORMAL HIGH (ref 11.5–15.5)
WBC: 13.4 K/uL — ABNORMAL HIGH (ref 4.0–10.5)
nRBC: 0 % (ref 0.0–0.2)

## 2023-11-18 LAB — I-STAT CHEM 8, ED
BUN: 7 mg/dL (ref 6–20)
Calcium, Ion: 1.08 mmol/L — ABNORMAL LOW (ref 1.15–1.40)
Chloride: 103 mmol/L (ref 98–111)
Creatinine, Ser: 1.1 mg/dL — ABNORMAL HIGH (ref 0.44–1.00)
Glucose, Bld: 84 mg/dL (ref 70–99)
HCT: 36 % (ref 36.0–46.0)
Hemoglobin: 12.2 g/dL (ref 12.0–15.0)
Potassium: 3.2 mmol/L — ABNORMAL LOW (ref 3.5–5.1)
Sodium: 138 mmol/L (ref 135–145)
TCO2: 21 mmol/L — ABNORMAL LOW (ref 22–32)

## 2023-11-18 MED ORDER — ONDANSETRON 4 MG PO TBDP
4.0000 mg | ORAL_TABLET | Freq: Once | ORAL | Status: AC
  Administered 2023-11-18: 4 mg via ORAL
  Filled 2023-11-18: qty 1

## 2023-11-18 NOTE — ED Notes (Signed)
 Pt complaining of increased abdominal pain and nausea. Triage RN and PA aware

## 2023-11-18 NOTE — ED Triage Notes (Signed)
 Patient BIB [facility] from home for abdominal pain since Surgery in August, now including headache and nausea in her complaints, seen Monday for same complaints. VSS, patient took tylenol  at home PTA. BP 162/94 HR 68 98% RA CBG 86 RR 18

## 2023-11-18 NOTE — ED Provider Triage Note (Signed)
 Emergency Medicine Provider Triage Evaluation Note  Natalie Edwards , a 58 y.o. female  was evaluated in triage.  Pt complains of abdominal pain.  States she was here 2 days ago for similar symptoms and her symptoms did not improve.  She had a negative workup including negative CT scan at that time.  Review of Systems  Positive: Abdominal pain, nausea Negative: diarrhea  Physical Exam  BP (!) 157/78   Pulse 63   Temp 98.9 F (37.2 C) (Oral)   Resp 16   Ht 5' 4 (1.626 m)   Wt 77.1 kg   SpO2 99%   BMI 29.18 kg/m  Gen:   Awake, no distress   Resp:  Normal effort  MSK:   Moves extremities without difficulty   Medical Decision Making  Medically screening exam initiated at 1:09 PM.  Appropriate orders placed.  Natalie Edwards was informed that the remainder of the evaluation will be completed by another provider, this initial triage assessment does not replace that evaluation, and the importance of remaining in the ED until their evaluation is complete.     Natalie Bouchard L, DO 11/18/23 1310

## 2023-11-18 NOTE — Telephone Encounter (Signed)
 Copied from CRM #[id number]. Topic: General - Other >> Nov 17, 2023 12:22 PM Pinkey ORN wrote: Reason for CRM: Transition Of Care Update >> Nov 17, 2023 12:24 PM Pinkey ORN wrote: Coleen Nurse Case Manager [facility] on behalf of patient, states patient refuses to verify her medications therefor she wasn't able to complete the review.

## 2023-11-18 NOTE — ED Notes (Signed)
 PATIENT LEFT STATING THE WAIT IS TOO LONG.

## 2023-11-18 NOTE — Telephone Encounter (Signed)
 Noted

## 2023-11-24 ENCOUNTER — Ambulatory Visit: Payer: Self-pay

## 2023-11-24 ENCOUNTER — Ambulatory Visit (INDEPENDENT_AMBULATORY_CARE_PROVIDER_SITE_OTHER): Admitting: Pulmonary Disease

## 2023-11-24 VITALS — BP 139/83 | HR 88 | Ht 64.0 in | Wt 172.2 lb

## 2023-11-24 DIAGNOSIS — G4700 Insomnia, unspecified: Secondary | ICD-10-CM | POA: Diagnosis not present

## 2023-11-24 DIAGNOSIS — F5104 Psychophysiologic insomnia: Secondary | ICD-10-CM

## 2023-11-24 MED ORDER — TEMAZEPAM 15 MG PO CAPS: 15.0000 mg | ORAL_CAPSULE | Freq: Every evening | ORAL | 0 refills | Status: DC | PRN

## 2023-11-24 NOTE — Telephone Encounter (Signed)
Attempted to reach pt. No answer, unable to LVM

## 2023-11-24 NOTE — Progress Notes (Signed)
 Subjective:    Patient ID: Natalie Edwards, female    DOB: 1965/04/27, 58 y.o.   MRN: [id number]  Patient with a history of chronic insomnia, has been difficult to treat  Was recently hospitalized for suicide attempt Tested positive for cocaine during hospitalization  During the hospitalization was prescribed trazodone , mirtazapine  - She states currently she is not using trazodone  because she does not like the way it makes her feel -She is also not using mirtazapine   Only antidepressant she is on is Prozac   Did have oxycodone  postsurgery - No longer using  She is also no longer using Robaxin   Previously had been on Belsomra , Restoril  did help  She is only using Tylenol  PM recently  Has difficulty falling asleep, difficulty staying asleep  She is an active smoker  She does have a history of snoring, no witnessed apneas She is only sleepy during the day when she does not get adequate rest at night Occasional dryness of her mouth in the mornings She does have headaches  No family history of sleep disordered breathing  Has a lot of stressors   Continues to smoke actively Past Medical History:  Diagnosis Date   Allergy    Anemia    Diverticulosis    Gall bladder stones 1993   Gastric ulcer    GERD (gastroesophageal reflux disease)    Hypertension    Renal mass    Social History   Socioeconomic History   Marital status: Single    Spouse name: Not on file   Number of children: 3   Years of education: Not on file   Highest education level: 12th grade  Occupational History   Occupation: environmental services at [employer]  Tobacco Use   Smoking status: Every Day    Current packs/day: 0.50    Average packs/day: 0.5 packs/day for 25.0 years (12.5 ttl pk-yrs)    Types: Cigarettes   Smokeless tobacco: Never   Tobacco comments:    Pt tried to quit - helps with anxiety     1 pack last patient 3 days   Vaping Use   Vaping status: Never Used  Substance and Sexual  Activity   Alcohol use: Yes    Comment: occasionally - last drink was July 4, 1 shot   Drug use: No   Sexual activity: Yes    Comment: hysterectomy  Other Topics Concern   Not on file  Social History Narrative   Lives at home in an apartment. Her mother lives with her.    Right handed   Caffeine: drinks approx. 36 oz of pepsi per day. Sometimes drinks 2 cups of coffee in a day as well.    Social Drivers of Corporate investment banker Strain: Low Risk  (03/06/2023)   Overall Financial Resource Strain (CARDIA)    Difficulty of Paying Living Expenses: Not very hard  Food Insecurity: Food Insecurity Present (11/04/2023)   Hunger Vital Sign    Worried About Running Out of Food in the Last Year: Never true    Ran Out of Food in the Last Year: Sometimes true  Transportation Needs: Unmet Transportation Needs (11/04/2023)   PRAPARE - Transportation    Lack of Transportation (Medical): No    Lack of Transportation (Non-Medical): Yes  Physical Activity: Sufficiently Active (05/22/2022)   Exercise Vital Sign    Days of Exercise per Week: 4 days    Minutes of Exercise per Session: 70 min  Stress: Stress Concern Present (03/06/2023)   [place]  [employer] - Occupational Stress Questionnaire    Feeling of Stress : Very much  Social Connections: Socially Isolated (11/04/2023)   Social Connection and Isolation Panel    Frequency of Communication with Friends and Family: Never    Frequency of Social Gatherings with Friends and Family: Never    Attends Religious Services: Never    Database administrator or Organizations: No    Attends Banker Meetings: Never    Marital Status: Divorced  Catering manager Violence: Patient Declined (11/04/2023)   Humiliation, Afraid, Rape, and Kick questionnaire    Fear of Current or Ex-Partner: Patient declined    Emotionally Abused: Patient declined    Physically Abused: Patient declined    Sexually Abused: Patient declined    Family History  Problem Relation Age of Onset   Cancer Maternal Uncle        Lung   Cancer Maternal Uncle        Lung   Cancer Maternal Uncle        Lung   Headache Neg Hx        I don't think so   Migraines Neg Hx        I don't think so   Review of Systems  Constitutional:  Negative for fever and unexpected weight change.  HENT:  Negative for congestion, dental problem, ear pain, nosebleeds, postnasal drip, rhinorrhea, sinus pressure, sneezing, sore throat and trouble swallowing.   Eyes:  Negative for redness and itching.  Respiratory: Negative.  Negative for apnea.   Cardiovascular: Negative.   Gastrointestinal: Negative.   Genitourinary:  Negative for dysuria.  Musculoskeletal:  Negative for joint swelling.  Skin:  Negative for rash.  Allergic/Immunologic: Negative.  Negative for environmental allergies, food allergies and immunocompromised state.  Neurological:  Negative for headaches.  Hematological:  Does not bruise/bleed easily.  Psychiatric/Behavioral:  Positive for sleep disturbance. Negative for dysphoric mood. The patient is nervous/anxious.       Objective:   Physical Exam Constitutional:      Appearance: Normal appearance.  HENT:     Head: Normocephalic.     Mouth/Throat:     Mouth: Mucous membranes are moist.     Comments: Mallampati 1, Eyes:     General:        Right eye: No discharge.        Left eye: No discharge.  Cardiovascular:     Rate and Rhythm: Normal rate and regular rhythm.     Pulses: Normal pulses.     Heart sounds: No murmur heard.    No friction rub.  Pulmonary:     Effort: Pulmonary effort is normal. No respiratory distress.     Breath sounds: No stridor. No wheezing or rhonchi.  Musculoskeletal:     Cervical back: No rigidity or tenderness.  Neurological:     Mental Status: She is alert.  Psychiatric:        Mood and Affect: Mood normal.    Vitals:   11/24/23 0838  BP: 139/83  Pulse: 88  SpO2: 95%        Assessment & Plan:   Patient with sleep onset and sleep maintenance insomnia Not taking any other medication-Tylenol  PM at present  She is not taking trazodone , not taking mirtazapine   I did discuss with.on upon further review of her records/recent hospitalization The only medication I will prescribe will be the temazepam  which seemed to help her sleep in the past  I will not  prescribe Belsomra   She did confirm that she is not taking any other sleep aid If she obtains any other sleep aid then I will stop prescribing temazepam   A prescription for temazepam  15 mg 30-day fill was provided She will need to let us  know, let pharmacy know when she needs refills  I did reach out and speak with our primary care doctor Dr. Raguel Blush to let her know my plans for today  Encouraged to continue following up with psychiatry  Risks with multiple sleep aids discussed

## 2023-11-24 NOTE — Patient Instructions (Addendum)
 I will see you back in about 3 months  [facility] - No refills will be provided -Will be expected to let us  know or let your pharmacy know to get refills on a regular basis - This will not be combined with any other sleep aid  I will make contact with your primary doctor as well to talk about sedating medications overall  Contact office any significant concerns

## 2023-11-24 NOTE — Telephone Encounter (Signed)
 FYI Only or Action Required?: Action required by provider: update on patient condition.  Patient was last seen in primary care on 08/06/2023 by Tanda Bleacher, MD.  Called Nurse Triage reporting Hypertension.  Symptoms began today.  Interventions attempted: Nothing.  Symptoms are: unsure.  Triage Disposition: See PCP When Office is Open (Within 3 Days)  Patient/caregiver understands and will follow disposition?: UnsureCopied from CRM #[id number]. Topic: Clinical - Red Word Triage >> Nov 24, 2023  2:41 PM [name] wrote: Red Word that prompted transfer to Nurse Triage: [name] from [facility] is calling to report the pt bp. It was elevated 160/100. The pt was previously taking amlodipine  and wants to know if she should be taking bp meds Reason for Disposition  Systolic BP >= 160 OR Diastolic >= 100  Answer Assessment - Initial Assessment Questions [name] with [employer] was doing Annual  well check. BP was 160/100 and 138/86 after calming down. [name] notified pt was on amlodipine  last year at well visit, but not currently. [name] is questioning if pt needs to be back on it. [name] stated pt ha significant mental issues and isn't sure if pt is taking any medication. [name] is not with pt during triage call. RN attempted to triage pt but unable to reach her. Please advise.     1. BLOOD PRESSURE: What is your blood pressure? Did you take at least two measurements 5 minutes apart?     160/100; 138/66 2. ONSET: When did you take your blood pressure?     today 3. HOW: How did you take your blood pressure? (e.g., automatic home BP monitor, visiting nurse)     Manual  4. HISTORY: Do you have a history of high blood pressure?     Yes  5. MEDICINES: Are you taking any medicines for blood pressure? Have you missed any doses recently?     Hydrochlorothiazide  per tiffany   6. OTHER SYMPTOMS: Do you have any symptoms? (e.g., blurred vision, chest pain, difficulty breathing, headache,  weakness) Denies all  Protocols used: Blood Pressure - High-A-AH

## 2023-11-25 ENCOUNTER — Ambulatory Visit: Admitting: Gastroenterology

## 2023-11-27 ENCOUNTER — Ambulatory Visit: Admitting: Gastroenterology

## 2023-12-18 ENCOUNTER — Emergency Department (HOSPITAL_COMMUNITY)
Admission: EM | Admit: 2023-12-18 | Discharge: 2023-12-18 | Disposition: A | Discharge status: Home / Self Care | Attending: Emergency Medicine | Admitting: Emergency Medicine

## 2023-12-18 ENCOUNTER — Ambulatory Visit: Payer: Self-pay | Admitting: *Deleted

## 2023-12-18 ENCOUNTER — Ambulatory Visit: Payer: Self-pay

## 2023-12-18 ENCOUNTER — Emergency Department (HOSPITAL_COMMUNITY)

## 2023-12-18 ENCOUNTER — Encounter (HOSPITAL_COMMUNITY): Payer: Self-pay

## 2023-12-18 DIAGNOSIS — R7989 Other specified abnormal findings of blood chemistry: Secondary | ICD-10-CM | POA: Diagnosis not present

## 2023-12-18 DIAGNOSIS — K5732 Diverticulitis of large intestine without perforation or abscess without bleeding: Secondary | ICD-10-CM | POA: Insufficient documentation

## 2023-12-18 DIAGNOSIS — R109 Unspecified abdominal pain: Secondary | ICD-10-CM | POA: Diagnosis present

## 2023-12-18 DIAGNOSIS — K5792 Diverticulitis of intestine, part unspecified, without perforation or abscess without bleeding: Secondary | ICD-10-CM

## 2023-12-18 LAB — CBC
HCT: 35.7 % — ABNORMAL LOW (ref 36.0–46.0)
Hemoglobin: 11.2 g/dL — ABNORMAL LOW (ref 12.0–15.0)
MCH: 25.2 pg — ABNORMAL LOW (ref 26.0–34.0)
MCHC: 31.4 g/dL (ref 30.0–36.0)
MCV: 80.4 fL (ref 80.0–100.0)
Platelets: 493 K/uL — ABNORMAL HIGH (ref 150–400)
RBC: 4.44 MIL/uL (ref 3.87–5.11)
RDW: 21.4 % — ABNORMAL HIGH (ref 11.5–15.5)
WBC: 12.2 K/uL — ABNORMAL HIGH (ref 4.0–10.5)
nRBC: 0 % (ref 0.0–0.2)

## 2023-12-18 LAB — COMPREHENSIVE METABOLIC PANEL WITH GFR
ALT: 38 U/L (ref 0–44)
AST: 39 U/L (ref 15–41)
Albumin: 4.4 g/dL (ref 3.5–5.0)
Alkaline Phosphatase: 70 U/L (ref 38–126)
Anion gap: 12 (ref 5–15)
BUN: 14 mg/dL (ref 6–20)
CO2: 22 mmol/L (ref 22–32)
Calcium: 9.5 mg/dL (ref 8.9–10.3)
Chloride: 105 mmol/L (ref 98–111)
Creatinine, Ser: 1.42 mg/dL — ABNORMAL HIGH (ref 0.44–1.00)
GFR, Estimated: 42 mL/min — ABNORMAL LOW (ref >60)
Glucose, Bld: 97 mg/dL (ref 70–99)
Potassium: 3.9 mmol/L (ref 3.5–5.1)
Sodium: 139 mmol/L (ref 135–145)
Total Bilirubin: 0.3 mg/dL (ref 0.0–1.2)
Total Protein: 7.6 g/dL (ref 6.5–8.1)

## 2023-12-18 LAB — LIPASE, BLOOD: Lipase: 29 U/L (ref 11–51)

## 2023-12-18 MED ORDER — HYDROMORPHONE HCL 1 MG/ML IJ SOLN
1.0000 mg | Freq: Once | INTRAMUSCULAR | Status: AC
  Administered 2023-12-18: 1 mg via INTRAVENOUS
  Filled 2023-12-18: qty 1

## 2023-12-18 MED ORDER — ONDANSETRON HCL 4 MG/2ML IJ SOLN
4.0000 mg | Freq: Once | INTRAMUSCULAR | Status: AC
  Administered 2023-12-18: 4 mg via INTRAVENOUS
  Filled 2023-12-18: qty 2

## 2023-12-18 MED ORDER — LACTATED RINGERS IV BOLUS
1000.0000 mL | Freq: Once | INTRAVENOUS | Status: AC
  Administered 2023-12-18: 1000 mL via INTRAVENOUS

## 2023-12-18 MED ORDER — IOHEXOL 300 MG/ML  SOLN
75.0000 mL | Freq: Once | INTRAMUSCULAR | Status: AC | PRN
  Administered 2023-12-18: 75 mL via INTRAVENOUS

## 2023-12-18 MED ORDER — OXYCODONE-ACETAMINOPHEN 5-325 MG PO TABS: 1.0000 | ORAL_TABLET | Freq: Four times a day (QID) | ORAL | 0 refills | Status: DC | PRN

## 2023-12-18 MED ORDER — METRONIDAZOLE 500 MG PO TABS
500.0000 mg | ORAL_TABLET | Freq: Two times a day (BID) | ORAL | 0 refills | Status: DC
Start: 2023-12-18 — End: 2023-12-26

## 2023-12-18 MED ORDER — CIPROFLOXACIN HCL 500 MG PO TABS: 500.0000 mg | ORAL_TABLET | Freq: Two times a day (BID) | ORAL | 0 refills | Status: DC

## 2023-12-18 NOTE — ED Triage Notes (Addendum)
 Patient arrived with complaints of nausea and left sided abdominal pain over the last two weeks. Taking Zofran  with no relief.

## 2023-12-18 NOTE — Telephone Encounter (Signed)
Patient was seen in the ED today.

## 2023-12-18 NOTE — ED Provider Notes (Signed)
 [facility]PT [facility] Emergency Department Provider Note MRN:  [id number]  Arrival date & time: 12/18/23     Chief Complaint   Nausea and Abdominal Pain   History of Present Illness   Natalie Edwards is a 58 y.o. year-old female presents to the ED with chief complaint of abdominal pain and nausea.  States that she has been having symptoms for about 2 weeks.  States that she has tried zofran  without relief.  Hx of diverticulitis and recent partial colectomy.  Denies fever or chills.  Has been having normal BMs.  History provided by patient.   Review of Systems  Pertinent positive and negative review of systems noted in HPI.    Physical Exam   Vitals:   12/18/23 0009  BP: (!) 148/88  Pulse: 78  Resp: 16  Temp: 97.9 F (36.6 C)  SpO2: 99%    CONSTITUTIONAL:  non toxic-appearing, NAD NEURO:  Alert and oriented x 3, CN 3-12 grossly intact EYES:  eyes equal and reactive ENT/NECK:  Supple, no stridor  CARDIO:  normal rate, regular rhythm, appears well-perfused  PULM:  No respiratory distress, CTAB GI/GU:  non-distended, TTP diffusely MSK/SPINE:  No gross deformities, no edema, moves all extremities  SKIN:  no rash, atraumatic   *Additional and/or pertinent findings included in MDM below  Diagnostic and Interventional Summary    EKG Interpretation Date/Time:    Ventricular Rate:    PR Interval:    QRS Duration:    QT Interval:    QTC Calculation:   R Axis:      Text Interpretation:         Labs Reviewed  COMPREHENSIVE METABOLIC PANEL WITH GFR - Abnormal; Notable for the following components:      Result Value   Creatinine, Ser 1.42 (*)    GFR, Estimated 42 (*)    All other components within normal limits  CBC - Abnormal; Notable for the following components:   WBC 12.2 (*)    Hemoglobin 11.2 (*)    HCT 35.7 (*)    MCH 25.2 (*)    RDW 21.4 (*)    Platelets 493 (*)    All other components within normal limits  LIPASE, BLOOD  URINALYSIS,  ROUTINE W REFLEX MICROSCOPIC    CT ABDOMEN PELVIS W CONTRAST  Final Result      Medications  HYDROmorphone  (DILAUDID ) injection 1 mg (1 mg Intravenous Given 12/18/23 0040)  ondansetron  (ZOFRAN ) injection 4 mg (4 mg Intravenous Given 12/18/23 0041)  lactated ringers  bolus 1,000 mL (0 mLs Intravenous Stopped 12/18/23 0308)  iohexol  (OMNIPAQUE ) 300 MG/ML solution 75 mL (75 mLs Intravenous Contrast Given 12/18/23 0251)     Procedures  /  Critical Care Procedures  ED Course and Medical Decision Making  I have reviewed the triage vital signs, the nursing notes, and pertinent available records from the EMR.  Social Determinants Affecting Complexity of Care: Patient has no clinically significant social determinants affecting this chief complaint..   ED Course: Clinical Course as of 12/18/23 0350  Fri Dec 18, 2023  0349 Comprehensive metabolic panel(!) Mildly increased creatinine from baseline, will recommend continuing to push fluids at home. [RB]  0349 CBC(!) Mild leukocytosis to 12.2 [RB]  0349 CT ABDOMEN PELVIS W CONTRAST CT shows some evidence of diverticulitis.  No obvious abscess or perforation. [RB]    Clinical Course User Index [RB] Vicky Charleston, PA-C    Medical Decision Making Patient presenting with lower abdominal pain over the past week or  so.  She has long history of diverticulitis, and states that this feels similar.  Has recent history of partial colectomy.  Will check labs and imaging, will treat pain.  Labs and workup discussed in ED course.  I discussed the patient's labs and imaging findings with her.  I advised admission, but she would like to go home.  She does not appear toxic, does appear to have capacity to make her own medical decisions.  Amount and/or Complexity of Data Reviewed Labs: ordered. Decision-making details documented in ED Course. Radiology: ordered. Decision-making details documented in ED Course.  Risk Prescription drug management.          Consultants: No consultations were needed in caring for this patient.   Treatment and Plan: I recommended hospitalization for IV antibiotics and continued monitoring and observation and pain management, but patient declined.  She states that she needs to be discharged.  I will send her home on Cipro  and Flagyl  and give her some pain medicine.  She is encouraged to follow-up with her GI doctor.  Return precautions discussed.    Final Clinical Impressions(s) / ED Diagnoses     ICD-10-CM   1. Diverticulitis  K57.92     2. Elevated serum creatinine  R79.89       ED Discharge Orders          Ordered    metroNIDAZOLE  (FLAGYL ) 500 MG tablet  2 times daily        12/18/23 0345    ciprofloxacin  (CIPRO ) 500 MG tablet  Every 12 hours        12/18/23 0345    oxyCODONE -acetaminophen  (PERCOCET/ROXICET) 5-325 MG tablet  Every 6 hours PRN        12/18/23 0345              Discharge Instructions Discussed with and Provided to Patient:     Discharge Instructions      Your CT scan shows evidence of diverticulitis.  Please return to the hospital for new or worsening symptoms.  Please follow-up with your GI doctor.  Please take antibiotics and pain medication as prescribed.       Vicky Charleston, PA-C 12/18/23 0350    Jerral Meth, MD 12/18/23 [phone]

## 2023-12-18 NOTE — Telephone Encounter (Signed)
   Copied from CRM [id number]. Topic: Clinical - Medication Question >> Dec 18, 2023  3:37 PM Natalie Edwards wrote: Reason for CRM: The patient is calling back to request nausea medication from Dr. Tanda following a diagnosis of diverticulitis earlier today at the [facility] ER at [facility].  The patient feels like she needs to throw up but she can not. The patietn is using [facility] on [address], [place], [place] [address]

## 2023-12-18 NOTE — Discharge Instructions (Addendum)
 Your CT scan shows evidence of diverticulitis.  Please return to the hospital for new or worsening symptoms.  Please follow-up with your GI doctor.  Please take antibiotics and pain medication as prescribed.

## 2023-12-18 NOTE — Telephone Encounter (Signed)
 FYI Only or Action Required?: Action required by provider: update on patient condition and requesting medication other than zofran  for nausea, patient taking cipro  for dx diverticultitis.  Patient was last seen in primary care on 08/06/2023 by Tanda Bleacher, MD.  Called Nurse Triage reporting Nausea.  Symptoms began yesterday.  Interventions attempted: Rest, hydration, or home remedies and Other: went to ED for treatment.  Symptoms are: unchanged.  Triage Disposition: Call PCP Within 24 Hours  Patient/caregiver understands and will follow disposition?: No, wishes to speak with PCP  Patient requesting call back. No available appt until 12/22/23. Patient does not feel UC will prescribed other medication for nausea except zofran  and patient reports zofran  not effective. Please advise.                  Copied from CRM #[id number]. Topic: Clinical - Red Word Triage >> Dec 18, 2023 11:43 AM Willma SAUNDERS wrote: Red Word that prompted transfer to Nurse Triage: Patient went to the ER last night and they advised her with diverticulitis, states they did not give her any medication for nausea and patient is currently nauseous. Reason for Disposition  Taking prescription medication that could cause nausea (e.g., narcotics/opiates, antibiotics, OCPs, many others)  Answer Assessment - Initial Assessment Questions Requesting medication for nausea other than zofran  . Reports zofran  not effective. Went to ED last night hx diverticulitis per patient report. ED did not prescribed anything for nausea to take at home. [facility]ing [facility] [address] [facility] [place]. Recommended patient would need OV and none available until 12/22/23. Recommended go to UC or back to ED . Patient reports only medication UC will give for nausea if zofran  and it has been ineffective. Please advise and patient requesting call back.     1. NAUSEA SEVERITY: How bad is the nausea? (e.g., mild, moderate,  severe; dehydration, weight loss)     Severe  2. ONSET: When did the nausea begin?     2 weeks ago and worsening  3. VOMITING: Any vomiting? If Yes, ask: How many times today?     No  4. RECURRENT SYMPTOM: Have you had nausea before? If Yes, ask: When was the last time? What happened that time?     Yes treated with zofran   but not effective 5. CAUSE: What do you think is causing the nausea?     Not sure but possible diverticulitis. Last seen in ED last night. Nausea worsening and only able to drink gingerale. Not able to eat . Some dizziness at times   6. PREGNANCY: Is there any chance you are pregnant? (e.g., unprotected intercourse, missed birth control pill, broken condom)     Na  Protocols used: Nausea-A-AH

## 2023-12-20 ENCOUNTER — Encounter (HOSPITAL_COMMUNITY): Payer: Self-pay | Admitting: Emergency Medicine

## 2023-12-20 ENCOUNTER — Emergency Department (HOSPITAL_COMMUNITY)
Admission: EM | Admit: 2023-12-20 | Discharge: 2023-12-20 | Disposition: A | Discharge status: Home / Self Care | Attending: Emergency Medicine | Admitting: Emergency Medicine

## 2023-12-20 ENCOUNTER — Emergency Department (HOSPITAL_COMMUNITY)

## 2023-12-20 DIAGNOSIS — I10 Essential (primary) hypertension: Secondary | ICD-10-CM | POA: Insufficient documentation

## 2023-12-20 DIAGNOSIS — R112 Nausea with vomiting, unspecified: Secondary | ICD-10-CM | POA: Diagnosis present

## 2023-12-20 DIAGNOSIS — R1084 Generalized abdominal pain: Secondary | ICD-10-CM | POA: Insufficient documentation

## 2023-12-20 LAB — COMPREHENSIVE METABOLIC PANEL WITH GFR
ALT: 22 U/L (ref 0–44)
AST: 20 U/L (ref 15–41)
Albumin: 4.5 g/dL (ref 3.5–5.0)
Alkaline Phosphatase: 70 U/L (ref 38–126)
Anion gap: 17 — ABNORMAL HIGH (ref 5–15)
BUN: 11 mg/dL (ref 6–20)
CO2: 16 mmol/L — ABNORMAL LOW (ref 22–32)
Calcium: 9.9 mg/dL (ref 8.9–10.3)
Chloride: 103 mmol/L (ref 98–111)
Creatinine, Ser: 1.03 mg/dL — ABNORMAL HIGH (ref 0.44–1.00)
GFR, Estimated: >60 mL/min (ref >60)
Glucose, Bld: 83 mg/dL (ref 70–99)
Potassium: 3.5 mmol/L (ref 3.5–5.1)
Sodium: 137 mmol/L (ref 135–145)
Total Bilirubin: 0.4 mg/dL (ref 0.0–1.2)
Total Protein: 7.9 g/dL (ref 6.5–8.1)

## 2023-12-20 LAB — URINALYSIS, ROUTINE W REFLEX MICROSCOPIC
Bilirubin Urine: NEGATIVE
Glucose, UA: NEGATIVE mg/dL
Hgb urine dipstick: NEGATIVE
Ketones, ur: 20 mg/dL — AB
Nitrite: NEGATIVE
Protein, ur: 100 mg/dL — AB
Specific Gravity, Urine: 1.043 — ABNORMAL HIGH (ref 1.005–1.030)
pH: 5 (ref 5.0–8.0)

## 2023-12-20 LAB — CBC WITH DIFFERENTIAL/PLATELET
Abs Immature Granulocytes: 0.03 K/uL (ref 0.00–0.07)
Basophils Absolute: 0 K/uL (ref 0.0–0.1)
Basophils Relative: 0 %
Eosinophils Absolute: 0.1 K/uL (ref 0.0–0.5)
Eosinophils Relative: 1 %
HCT: 37.5 % (ref 36.0–46.0)
Hemoglobin: 12 g/dL (ref 12.0–15.0)
Immature Granulocytes: 0 %
Lymphocytes Relative: 29 %
Lymphs Abs: 2.8 K/uL (ref 0.7–4.0)
MCH: 25.9 pg — ABNORMAL LOW (ref 26.0–34.0)
MCHC: 32 g/dL (ref 30.0–36.0)
MCV: 81 fL (ref 80.0–100.0)
Monocytes Absolute: 0.9 K/uL (ref 0.1–1.0)
Monocytes Relative: 9 %
Neutro Abs: 5.9 K/uL (ref 1.7–7.7)
Neutrophils Relative %: 61 %
Platelets: 506 K/uL — ABNORMAL HIGH (ref 150–400)
RBC: 4.63 MIL/uL (ref 3.87–5.11)
RDW: 21.8 % — ABNORMAL HIGH (ref 11.5–15.5)
WBC: 9.7 K/uL (ref 4.0–10.5)
nRBC: 0 % (ref 0.0–0.2)

## 2023-12-20 LAB — LIPASE, BLOOD: Lipase: 14 U/L (ref 11–51)

## 2023-12-20 LAB — I-STAT CG4 LACTIC ACID, ED: Lactic Acid, Venous: 0.7 mmol/L (ref 0.5–1.9)

## 2023-12-20 MED ORDER — IOHEXOL 300 MG/ML  SOLN
100.0000 mL | Freq: Once | INTRAMUSCULAR | Status: AC | PRN
  Administered 2023-12-20: 100 mL via INTRAVENOUS

## 2023-12-20 MED ORDER — ONDANSETRON HCL 4 MG/2ML IJ SOLN
4.0000 mg | Freq: Once | INTRAMUSCULAR | Status: AC
  Administered 2023-12-20: 4 mg via INTRAVENOUS
  Filled 2023-12-20: qty 2

## 2023-12-20 MED ORDER — METOCLOPRAMIDE HCL 10 MG PO TABS
10.0000 mg | ORAL_TABLET | Freq: Four times a day (QID) | ORAL | 0 refills | Status: AC
Start: 2023-12-20 — End: ?

## 2023-12-20 MED ORDER — SODIUM CHLORIDE 0.9 % IV BOLUS
1000.0000 mL | Freq: Once | INTRAVENOUS | Status: AC
  Administered 2023-12-20: 1000 mL via INTRAVENOUS

## 2023-12-20 MED ORDER — MORPHINE SULFATE (PF) 4 MG/ML IV SOLN
4.0000 mg | Freq: Once | INTRAVENOUS | Status: AC
  Administered 2023-12-20: 4 mg via INTRAVENOUS
  Filled 2023-12-20: qty 1

## 2023-12-20 MED ORDER — HYDROCODONE-ACETAMINOPHEN 5-325 MG PO TABS: 1.0000 | ORAL_TABLET | Freq: Four times a day (QID) | ORAL | 0 refills | Status: AC | PRN

## 2023-12-20 NOTE — ED Triage Notes (Signed)
 Pt arriving POV with abdominal pain, pt states she was diagnosed with diverticulitis a few days ago. Pain 6/10. N/V. Last able to eat on Friday.

## 2023-12-20 NOTE — ED Provider Notes (Signed)
 [facility] EMERGENCY DEPARTMENT AT [facility] Provider Note   CSN: [id number] Arrival date & time: 12/20/23  9296     Patient presents with: Abdominal Pain   Natalie Edwards is a 58 y.o. female with past medical history significant for hypertension, alcohol abuse, history of diverticulitis, PUD who presents to the ED due to worsening abdominal pain.  Patient was seen in the ED on 10/3 and diagnosed with diverticulitis.  At that time she was offered admission for IV antibiotics however, declined and was discharged with ciprofloxacin  and Flagyl  which she has been compliant with.  She notes pain has become more severe over the past few days.  Admits to numerous episodes of nonbloody, nonbilious emesis and loose stool.  Denies any fever.  No chest pain or shortness of breath.  Had a recent colectomy on 8/12.  History obtained from patient and past medical records. No interpreter used during encounter.       Prior to Admission medications   Medication Sig Start Date End Date Taking? Authorizing Provider  HYDROcodone -acetaminophen  (NORCO/VICODIN) 5-325 MG tablet Take 1 tablet by mouth every 6 (six) hours as needed. 12/20/23  Yes Batu Cassin, Aleck BROCKS, PA-C  metoCLOPramide  (REGLAN ) 10 MG tablet Take 1 tablet (10 mg total) by mouth every 6 (six) hours. 12/20/23  Yes Pastor Sgro, Aleck BROCKS, PA-C  ciprofloxacin  (CIPRO ) 500 MG tablet Take 1 tablet (500 mg total) by mouth every 12 (twelve) hours. 12/18/23   Vicky Charleston, PA-C  FLUoxetine  (PROZAC ) 40 MG capsule Take 1 capsule (40 mg total) by mouth daily. 11/10/23   Donnelly Mellow, MD  metroNIDAZOLE  (FLAGYL ) 500 MG tablet Take 1 tablet (500 mg total) by mouth 2 (two) times daily. 12/18/23   Vicky Charleston, PA-C  Multiple Vitamin (MULTIVITAMIN WITH MINERALS) TABS tablet Take 1 tablet by mouth daily. 11/10/23   Jadapalle, Sree, MD  nicotine  (NICODERM CQ  - DOSED IN MG/24 HOURS) 14 mg/24hr patch Place 1 patch (14 mg total) onto the skin daily at  6 (six) AM. 11/10/23   Donnelly Mellow, MD  oxyCODONE -acetaminophen  (PERCOCET/ROXICET) 5-325 MG tablet Take 1-2 tablets by mouth every 6 (six) hours as needed for severe pain (pain score 7-10). 12/18/23   Vicky Charleston, PA-C  temazepam  (RESTORIL ) 15 MG capsule Take 1 capsule (15 mg total) by mouth at bedtime as needed for sleep. 11/24/23   Neda Jennet LABOR, MD    Allergies: Hydroxyzine , Nsaids, Ambien  [zolpidem  tartrate], and Ibuprofen     Review of Systems  Constitutional:  Negative for fever.  Respiratory:  Negative for shortness of breath.   Cardiovascular:  Negative for chest pain.  Gastrointestinal:  Positive for abdominal pain, diarrhea, nausea and vomiting.    Updated Vital Signs BP (!) 151/83 (BP Location: Right Arm)   Pulse 86   Temp 97.9 F (36.6 C) (Oral)   Resp 19   SpO2 100%   Physical Exam Vitals and nursing note reviewed.  Constitutional:      General: She is not in acute distress.    Appearance: She is not ill-appearing.  HENT:     Head: Normocephalic.  Eyes:     Pupils: Pupils are equal, round, and reactive to light.  Cardiovascular:     Rate and Rhythm: Normal rate and regular rhythm.     Pulses: Normal pulses.     Heart sounds: Normal heart sounds. No murmur heard.    No friction rub. No gallop.  Pulmonary:     Effort: Pulmonary effort is normal.  Breath sounds: Normal breath sounds.  Abdominal:     General: Abdomen is flat. There is no distension.     Palpations: Abdomen is soft.     Tenderness: There is abdominal tenderness. There is no guarding or rebound.     Comments: Diffusely tender without rebound or guarding  Musculoskeletal:        General: Normal range of motion.     Cervical back: Neck supple.  Skin:    General: Skin is warm and dry.  Neurological:     General: No focal deficit present.     Mental Status: She is alert.  Psychiatric:        Mood and Affect: Mood normal.        Behavior: Behavior normal.     (all labs ordered  are listed, but only abnormal results are displayed) Labs Reviewed  COMPREHENSIVE METABOLIC PANEL WITH GFR - Abnormal; Notable for the following components:      Result Value   CO2 16 (*)    Creatinine, Ser 1.03 (*)    Anion gap 17 (*)    All other components within normal limits  CBC WITH DIFFERENTIAL/PLATELET - Abnormal; Notable for the following components:   MCH 25.9 (*)    RDW 21.8 (*)    Platelets 506 (*)    All other components within normal limits  URINALYSIS, ROUTINE W REFLEX MICROSCOPIC - Abnormal; Notable for the following components:   APPearance HAZY (*)    Specific Gravity, Urine 1.043 (*)    Ketones, ur 20 (*)    Protein, ur 100 (*)    Leukocytes,Ua MODERATE (*)    Bacteria, UA RARE (*)    All other components within normal limits  CULTURE, BLOOD (ROUTINE X 2)  CULTURE, BLOOD (ROUTINE X 2)  LIPASE, BLOOD  I-STAT CG4 LACTIC ACID, ED    EKG: None  Radiology: CT ABDOMEN PELVIS W CONTRAST Result Date: 12/20/2023 EXAM: CT ABDOMEN AND PELVIS WITH CONTRAST 12/20/2023 09:35:22 AM TECHNIQUE: CT of the abdomen and pelvis was performed with the administration of 100 mL of iohexol  (OMNIPAQUE ) 300 MG/ML solution. Multiplanar reformatted images are provided for review. Automated exposure control, iterative reconstruction, and/or weight-based adjustment of the mA/kV was utilized to reduce the radiation dose to as low as reasonably achievable. COMPARISON: 12/18/2023 and previous. CLINICAL HISTORY: LLQ abdominal pain. Pt arriving POV with abdominal pain, pt states she was diagnosed with diverticulitis a few days ago. Pain 6/10. N/V. Last able to eat on Friday. FINDINGS: LOWER CHEST: No acute abnormality. LIVER: The liver is unremarkable. GALLBLADDER AND BILE DUCTS: Cholecystectomy clips. No biliary ductal dilatation. SPLEEN: No acute abnormality. PANCREAS: No acute abnormality. ADRENAL GLANDS: 1.9 cm left adrenal nodule present since 07/31/2016 consistent with benign process such as  adenoma. KIDNEYS, URETERS AND BLADDER: Multiple cortical lesions in kidneys, some of which can be characterized as simple cysts, largest 4.1 cm 22 HU mid left kidney. Per consensus, no follow-up is needed for simple Bosniak type 1 and 2 renal cysts, unless the patient has a malignancy history or risk factors. No stones in the kidneys or ureters. No hydronephrosis. No perinephric or periureteral stranding. Urinary bladder is unremarkable. GI AND BOWEL: Stomach demonstrates no acute abnormality. The mild inflammatory/edematous changes adjacent to the descending colon are marginally less conspicuous. A few scattered distal descending/proximal sigmoid diverticula without adjacent inflammatory change. Staple line in the proximal sigmoid colon. There is no bowel obstruction. PERITONEUM AND RETROPERITONEUM: No ascites. No free air. VASCULATURE: Aorta is normal  in caliber. LYMPH NODES: No lymphadenopathy. REPRODUCTIVE ORGANS: Partial hysterectomy. BONES AND SOFT TISSUES: No acute osseous abnormality. No focal soft tissue abnormality. IMPRESSION: 1. No acute findings. 2. Diverticulosis with decreased  inflammatory change. Electronically signed by: Katheleen Faes MD 12/20/2023 10:09 AM EDT RP Workstation: [id number]     Procedures   Medications Ordered in the ED  sodium chloride  0.9 % bolus 1,000 mL (1,000 mLs Intravenous New Bag/Given 12/20/23 0901)  ondansetron  (ZOFRAN ) injection 4 mg (4 mg Intravenous Given 12/20/23 0825)  morphine  (PF) 4 MG/ML injection 4 mg (4 mg Intravenous Given 12/20/23 0827)  iohexol  (OMNIPAQUE ) 300 MG/ML solution 100 mL (100 mLs Intravenous Contrast Given 12/20/23 9077)                                    Medical Decision Making Amount and/or Complexity of Data Reviewed External Data Reviewed: notes.    Details: Recent admission note Labs: ordered. Decision-making details documented in ED Course. Radiology: ordered and independent interpretation performed. Decision-making details  documented in ED Course.  Risk Prescription drug management.   This patient presents to the ED for concern of abdominal pain, this involves an extensive number of treatment options, and is a complaint that carries with it a high risk of complications and morbidity.  The differential diagnosis includes diverticulitis, abscess, perforation, etc  58 year old female presents to the ED due to worsening abdominal pain associated with nausea and vomiting.  Diagnosed with diverticulitis on 10/3 and discharged with ciprofloxacin  and Flagyl  which she has been compliant with.  Recent admission for diverticulitis in August where she had a colectomy on 8/12.  Denies fever.  Upon arrival patient afebrile and mildly tachycardic at 104.  Patient appears uncomfortable in bed.  Abdomen soft, nondistended with diffuse tenderness without rebound or guarding.  Routine labs ordered.  Will repeat CT abdomen to rule out evidence of abscess or perforation.  Added blood cultures and lactic acid.  IV fluids, morphine , and Zofran  given.  CBC with no leukocytosis.  Normal hemoglobin.  CMP reassuring.  Does have a slight anion gap of 17 and bicarb of 16, likely starvation ketosis due to decreased po intake and emesis.  Lipase normal.  Low suspicion for pancreatitis.  Lactic acid normal.  CT abdomen personally reviewed and interpreted which negative for any acute abnormalities.  Decreased inflammatory changes.  No evidence of active diverticulitis, perforation, or abscess. UA with leukocytes and rare bacteria. 0-5 WBC. Patient denies urinary symptoms. Low suspicion for acute cystitis.  Patient able to tolerate p.o. without difficulty.  Pain controlled.  Advised patient to continue taking her antibiotics as previously prescribed.  Will discharge patient with short course of pain medication and nausea medication.  Advised patient to follow-up with PCP and GI for further evaluation.  Patient stable for discharge. Strict ED precautions  discussed with patient. Patient states understanding and agrees to plan. Patient discharged home in no acute distress and stable vitals  Co morbidities that complicate the patient evaluation  HTN, PUD   Social Determinants of Health:  Alcohol abuse  Test / Admission - Considered:  Considered admission for IV antibiotics for diverticulitis however, CT scan reassuring.  Decreased inflammatory changes.  No evidence of active diverticulitis.  Patient able to tolerate p.o. at bedside.  Pain controlled.  Advised patient to continue taking her antibiotics as previously prescribed.     Final diagnoses:  Generalized abdominal pain  Nausea and  vomiting, unspecified vomiting type    ED Discharge Orders          Ordered    metoCLOPramide  (REGLAN ) 10 MG tablet  Every 6 hours        12/20/23 1126    HYDROcodone -acetaminophen  (NORCO/VICODIN) 5-325 MG tablet  Every 6 hours PRN        12/20/23 1128               Cornell Gaber C, PA-C 12/20/23 1130    Freddi Hamilton, MD 12/21/23 0745

## 2023-12-20 NOTE — Discharge Instructions (Signed)
 It was a pleasure taking care of you today.  As discussed, your workup is reassuring.  CT scan did not show any evidence of diverticulitis however, continue taking your antibiotics as prescribed.  I am sending you home with nausea medication.  Take as needed for nausea.  I am also sending you home with a short course of pain medication.  Take as needed for severe pain.  Please follow-up with PCP for recheck this week.  Return to the ER for any worsening symptoms.

## 2023-12-21 ENCOUNTER — Other Ambulatory Visit: Payer: Self-pay

## 2023-12-21 ENCOUNTER — Emergency Department (HOSPITAL_COMMUNITY): Admission: EM | Admit: 2023-12-21 | Discharge: 2023-12-21 | Discharge status: Against Medical Advice

## 2023-12-21 DIAGNOSIS — R109 Unspecified abdominal pain: Secondary | ICD-10-CM | POA: Diagnosis present

## 2023-12-21 DIAGNOSIS — Z5321 Procedure and treatment not carried out due to patient leaving prior to being seen by health care provider: Secondary | ICD-10-CM | POA: Insufficient documentation

## 2023-12-21 DIAGNOSIS — R112 Nausea with vomiting, unspecified: Secondary | ICD-10-CM | POA: Diagnosis not present

## 2023-12-21 LAB — CBC
HCT: 35 % — ABNORMAL LOW (ref 36.0–46.0)
Hemoglobin: 11.4 g/dL — ABNORMAL LOW (ref 12.0–15.0)
MCH: 26.3 pg (ref 26.0–34.0)
MCHC: 32.6 g/dL (ref 30.0–36.0)
MCV: 80.6 fL (ref 80.0–100.0)
Platelets: 446 K/uL — ABNORMAL HIGH (ref 150–400)
RBC: 4.34 MIL/uL (ref 3.87–5.11)
RDW: 22.1 % — ABNORMAL HIGH (ref 11.5–15.5)
WBC: 13 K/uL — ABNORMAL HIGH (ref 4.0–10.5)
nRBC: 0 % (ref 0.0–0.2)

## 2023-12-21 LAB — COMPREHENSIVE METABOLIC PANEL WITH GFR
ALT: 19 U/L (ref 0–44)
AST: 26 U/L (ref 15–41)
Albumin: 4.4 g/dL (ref 3.5–5.0)
Alkaline Phosphatase: 65 U/L (ref 38–126)
Anion gap: 15 (ref 5–15)
BUN: 5 mg/dL — ABNORMAL LOW (ref 6–20)
CO2: 15 mmol/L — ABNORMAL LOW (ref 22–32)
Calcium: 9.7 mg/dL (ref 8.9–10.3)
Chloride: 105 mmol/L (ref 98–111)
Creatinine, Ser: 0.96 mg/dL (ref 0.44–1.00)
GFR, Estimated: >60 mL/min (ref >60)
Glucose, Bld: 128 mg/dL — ABNORMAL HIGH (ref 70–99)
Potassium: 3.3 mmol/L — ABNORMAL LOW (ref 3.5–5.1)
Sodium: 135 mmol/L (ref 135–145)
Total Bilirubin: 0.4 mg/dL (ref 0.0–1.2)
Total Protein: 7.6 g/dL (ref 6.5–8.1)

## 2023-12-21 LAB — LIPASE, BLOOD: Lipase: 13 U/L (ref 11–51)

## 2023-12-21 MED ORDER — ONDANSETRON 4 MG PO TBDP
4.0000 mg | ORAL_TABLET | Freq: Once | ORAL | Status: DC | PRN
Start: 2023-12-21 — End: 2023-12-21

## 2023-12-21 MED ORDER — ONDANSETRON 4 MG PO TBDP
4.0000 mg | ORAL_TABLET | Freq: Once | ORAL | Status: AC | PRN
  Administered 2023-12-21: 4 mg via ORAL
  Filled 2023-12-21: qty 1

## 2023-12-21 MED ORDER — ONDANSETRON HCL 4 MG/2ML IJ SOLN: 4.0000 mg | Freq: Once | INTRAMUSCULAR | Status: DC | PRN

## 2023-12-21 NOTE — ED Triage Notes (Signed)
 Pt returns for re-evaluation of ongoing abdominal pain with NV. Was seen here on 10/5. Has been taken prescribed Reglan  and prochlorperazine  without relief.   PTAR VS RR 20 BP 180/100, 96% RA, T 97.4

## 2023-12-22 ENCOUNTER — Ambulatory Visit: Payer: Self-pay

## 2023-12-23 ENCOUNTER — Other Ambulatory Visit: Payer: Self-pay

## 2023-12-23 ENCOUNTER — Encounter (HOSPITAL_COMMUNITY): Payer: Self-pay

## 2023-12-23 ENCOUNTER — Inpatient Hospital Stay (HOSPITAL_COMMUNITY)
Admission: EM | Admit: 2023-12-23 | Discharge: 2023-12-26 | DRG: 392 | Disposition: A | Discharge status: Home / Self Care

## 2023-12-23 ENCOUNTER — Emergency Department (HOSPITAL_COMMUNITY)

## 2023-12-23 DIAGNOSIS — Z720 Tobacco use: Secondary | ICD-10-CM | POA: Insufficient documentation

## 2023-12-23 DIAGNOSIS — F411 Generalized anxiety disorder: Secondary | ICD-10-CM | POA: Diagnosis present

## 2023-12-23 DIAGNOSIS — R110 Nausea: Secondary | ICD-10-CM | POA: Diagnosis present

## 2023-12-23 DIAGNOSIS — R112 Nausea with vomiting, unspecified: Secondary | ICD-10-CM | POA: Diagnosis not present

## 2023-12-23 DIAGNOSIS — E876 Hypokalemia: Secondary | ICD-10-CM | POA: Diagnosis present

## 2023-12-23 DIAGNOSIS — Z91148 Patient's other noncompliance with medication regimen for other reason: Secondary | ICD-10-CM

## 2023-12-23 DIAGNOSIS — E872 Acidosis, unspecified: Secondary | ICD-10-CM | POA: Diagnosis present

## 2023-12-23 DIAGNOSIS — R1032 Left lower quadrant pain: Secondary | ICD-10-CM

## 2023-12-23 DIAGNOSIS — F1490 Cocaine use, unspecified, uncomplicated: Secondary | ICD-10-CM | POA: Diagnosis present

## 2023-12-23 DIAGNOSIS — K449 Diaphragmatic hernia without obstruction or gangrene: Secondary | ICD-10-CM | POA: Diagnosis present

## 2023-12-23 DIAGNOSIS — F1010 Alcohol abuse, uncomplicated: Secondary | ICD-10-CM | POA: Diagnosis present

## 2023-12-23 DIAGNOSIS — I10 Essential (primary) hypertension: Secondary | ICD-10-CM | POA: Diagnosis present

## 2023-12-23 DIAGNOSIS — F339 Major depressive disorder, recurrent, unspecified: Secondary | ICD-10-CM | POA: Diagnosis present

## 2023-12-23 DIAGNOSIS — Z8711 Personal history of peptic ulcer disease: Secondary | ICD-10-CM

## 2023-12-23 DIAGNOSIS — E8729 Other acidosis: Secondary | ICD-10-CM | POA: Diagnosis present

## 2023-12-23 DIAGNOSIS — K317 Polyp of stomach and duodenum: Secondary | ICD-10-CM | POA: Diagnosis present

## 2023-12-23 DIAGNOSIS — Z79899 Other long term (current) drug therapy: Secondary | ICD-10-CM

## 2023-12-23 DIAGNOSIS — K219 Gastro-esophageal reflux disease without esophagitis: Secondary | ICD-10-CM | POA: Diagnosis present

## 2023-12-23 DIAGNOSIS — F329 Major depressive disorder, single episode, unspecified: Secondary | ICD-10-CM | POA: Diagnosis present

## 2023-12-23 DIAGNOSIS — F17210 Nicotine dependence, cigarettes, uncomplicated: Secondary | ICD-10-CM | POA: Diagnosis present

## 2023-12-23 DIAGNOSIS — Z888 Allergy status to other drugs, medicaments and biological substances status: Secondary | ICD-10-CM

## 2023-12-23 DIAGNOSIS — Z9151 Personal history of suicidal behavior: Secondary | ICD-10-CM

## 2023-12-23 DIAGNOSIS — E860 Dehydration: Secondary | ICD-10-CM | POA: Diagnosis present

## 2023-12-23 DIAGNOSIS — F419 Anxiety disorder, unspecified: Secondary | ICD-10-CM | POA: Diagnosis present

## 2023-12-23 DIAGNOSIS — N3000 Acute cystitis without hematuria: Secondary | ICD-10-CM | POA: Diagnosis present

## 2023-12-23 DIAGNOSIS — Z9049 Acquired absence of other specified parts of digestive tract: Secondary | ICD-10-CM

## 2023-12-23 DIAGNOSIS — Z886 Allergy status to analgesic agent status: Secondary | ICD-10-CM

## 2023-12-23 DIAGNOSIS — R7303 Prediabetes: Secondary | ICD-10-CM | POA: Diagnosis present

## 2023-12-23 DIAGNOSIS — Z716 Tobacco abuse counseling: Secondary | ICD-10-CM

## 2023-12-23 DIAGNOSIS — Z5900 Homelessness unspecified: Secondary | ICD-10-CM

## 2023-12-23 DIAGNOSIS — Z90710 Acquired absence of both cervix and uterus: Secondary | ICD-10-CM

## 2023-12-23 DIAGNOSIS — N179 Acute kidney failure, unspecified: Principal | ICD-10-CM | POA: Diagnosis present

## 2023-12-23 LAB — URINALYSIS, W/ REFLEX TO CULTURE (INFECTION SUSPECTED)
Bilirubin Urine: NEGATIVE
Glucose, UA: NEGATIVE mg/dL
Ketones, ur: 20 mg/dL — AB
Nitrite: NEGATIVE
Protein, ur: >=300 mg/dL — AB
Specific Gravity, Urine: 1.032 — ABNORMAL HIGH (ref 1.005–1.030)
pH: 5 (ref 5.0–8.0)

## 2023-12-23 LAB — COMPREHENSIVE METABOLIC PANEL WITH GFR
ALT: 19 U/L (ref 0–44)
AST: 21 U/L (ref 15–41)
Albumin: 3.9 g/dL (ref 3.5–5.0)
Alkaline Phosphatase: 50 U/L (ref 38–126)
Anion gap: 17 — ABNORMAL HIGH (ref 5–15)
BUN: 5 mg/dL — ABNORMAL LOW (ref 6–20)
CO2: 14 mmol/L — ABNORMAL LOW (ref 22–32)
Calcium: 9.1 mg/dL (ref 8.9–10.3)
Chloride: 112 mmol/L — ABNORMAL HIGH (ref 98–111)
Creatinine, Ser: 1.35 mg/dL — ABNORMAL HIGH (ref 0.44–1.00)
GFR, Estimated: 45 mL/min — ABNORMAL LOW (ref >60)
Glucose, Bld: 128 mg/dL — ABNORMAL HIGH (ref 70–99)
Potassium: 2.7 mmol/L — CL (ref 3.5–5.1)
Sodium: 143 mmol/L (ref 135–145)
Total Bilirubin: 0.9 mg/dL (ref 0.0–1.2)
Total Protein: 7.3 g/dL (ref 6.5–8.1)

## 2023-12-23 LAB — CBC WITH DIFFERENTIAL/PLATELET
Abs Immature Granulocytes: 0.06 K/uL (ref 0.00–0.07)
Basophils Absolute: 0 K/uL (ref 0.0–0.1)
Basophils Relative: 0 %
Eosinophils Absolute: 0.1 K/uL (ref 0.0–0.5)
Eosinophils Relative: 0 %
HCT: 37.5 % (ref 36.0–46.0)
Hemoglobin: 12.4 g/dL (ref 12.0–15.0)
Immature Granulocytes: 0 %
Lymphocytes Relative: 20 %
Lymphs Abs: 2.8 K/uL (ref 0.7–4.0)
MCH: 26 pg (ref 26.0–34.0)
MCHC: 33.1 g/dL (ref 30.0–36.0)
MCV: 78.6 fL — ABNORMAL LOW (ref 80.0–100.0)
Monocytes Absolute: 1 K/uL (ref 0.1–1.0)
Monocytes Relative: 7 %
Neutro Abs: 9.7 K/uL — ABNORMAL HIGH (ref 1.7–7.7)
Neutrophils Relative %: 73 %
Platelets: 471 K/uL — ABNORMAL HIGH (ref 150–400)
RBC: 4.77 MIL/uL (ref 3.87–5.11)
RDW: 22.5 % — ABNORMAL HIGH (ref 11.5–15.5)
WBC: 13.6 K/uL — ABNORMAL HIGH (ref 4.0–10.5)
nRBC: 0 % (ref 0.0–0.2)

## 2023-12-23 LAB — I-STAT CHEM 8, ED
BUN: 4 mg/dL — ABNORMAL LOW (ref 6–20)
Calcium, Ion: 1.2 mmol/L (ref 1.15–1.40)
Chloride: 114 mmol/L — ABNORMAL HIGH (ref 98–111)
Creatinine, Ser: 1.2 mg/dL — ABNORMAL HIGH (ref 0.44–1.00)
Glucose, Bld: 129 mg/dL — ABNORMAL HIGH (ref 70–99)
HCT: 41 % (ref 36.0–46.0)
Hemoglobin: 13.9 g/dL (ref 12.0–15.0)
Potassium: 2.7 mmol/L — CL (ref 3.5–5.1)
Sodium: 147 mmol/L — ABNORMAL HIGH (ref 135–145)
TCO2: 18 mmol/L — ABNORMAL LOW (ref 22–32)

## 2023-12-23 LAB — I-STAT CG4 LACTIC ACID, ED: Lactic Acid, Venous: 2.2 mmol/L (ref 0.5–1.9)

## 2023-12-23 LAB — MAGNESIUM: Magnesium: 1.5 mg/dL — ABNORMAL LOW (ref 1.7–2.4)

## 2023-12-23 MED ORDER — SODIUM CHLORIDE 0.9 % IV SOLN: 1000.0000 mL | INTRAVENOUS | Status: DC

## 2023-12-23 MED ORDER — ENOXAPARIN SODIUM 40 MG/0.4ML IJ SOSY
40.0000 mg | PREFILLED_SYRINGE | Freq: Every day | INTRAMUSCULAR | Status: DC
  Administered 2023-12-23 – 2023-12-26 (×4): 40 mg via SUBCUTANEOUS
  Filled 2023-12-23 (×4): qty 0.4

## 2023-12-23 MED ORDER — TRAZODONE HCL 50 MG PO TABS
100.0000 mg | ORAL_TABLET | Freq: Every evening | ORAL | Status: DC | PRN
  Administered 2023-12-23 – 2023-12-24 (×2): 100 mg via ORAL
  Filled 2023-12-23 (×2): qty 2

## 2023-12-23 MED ORDER — HYDROMORPHONE HCL 1 MG/ML IJ SOLN
0.5000 mg | Freq: Once | INTRAMUSCULAR | Status: AC
  Administered 2023-12-23: 0.5 mg via INTRAVENOUS
  Filled 2023-12-23: qty 1

## 2023-12-23 MED ORDER — PROPRANOLOL HCL 10 MG PO TABS: 10.0000 mg | ORAL_TABLET | Freq: Every day | ORAL | Status: DC

## 2023-12-23 MED ORDER — POTASSIUM CHLORIDE 10 MEQ/100ML IV SOLN
10.0000 meq | INTRAVENOUS | Status: AC
  Administered 2023-12-23 (×2): 10 meq via INTRAVENOUS
  Filled 2023-12-23 (×2): qty 100

## 2023-12-23 MED ORDER — ONDANSETRON HCL 4 MG/2ML IJ SOLN: 4.0000 mg | Freq: Three times a day (TID) | INTRAMUSCULAR | Status: DC

## 2023-12-23 MED ORDER — POTASSIUM CHLORIDE CRYS ER 20 MEQ PO TBCR
40.0000 meq | EXTENDED_RELEASE_TABLET | Freq: Three times a day (TID) | ORAL | Status: AC
  Administered 2023-12-23 – 2023-12-24 (×3): 40 meq via ORAL
  Filled 2023-12-23 (×3): qty 2

## 2023-12-23 MED ORDER — ONDANSETRON HCL 4 MG/2ML IJ SOLN
4.0000 mg | Freq: Four times a day (QID) | INTRAMUSCULAR | Status: DC | PRN
  Administered 2023-12-23 – 2023-12-25 (×6): 4 mg via INTRAVENOUS
  Filled 2023-12-23 (×6): qty 2

## 2023-12-23 MED ORDER — ACETAMINOPHEN 500 MG PO TABS
1000.0000 mg | ORAL_TABLET | Freq: Three times a day (TID) | ORAL | Status: DC
  Administered 2023-12-24 – 2023-12-26 (×7): 1000 mg via ORAL
  Filled 2023-12-23 (×8): qty 2

## 2023-12-23 MED ORDER — PROCHLORPERAZINE MALEATE 5 MG PO TABS: 5.0000 mg | ORAL_TABLET | Freq: Four times a day (QID) | ORAL | Status: DC | PRN

## 2023-12-23 MED ORDER — CEFTRIAXONE SODIUM 1 G IJ SOLR
1.0000 g | INTRAMUSCULAR | Status: DC
  Administered 2023-12-23 – 2023-12-26 (×4): 1 g via INTRAVENOUS
  Filled 2023-12-23 (×4): qty 10

## 2023-12-23 MED ORDER — SODIUM CHLORIDE 0.9 % IV BOLUS (SEPSIS)
1000.0000 mL | Freq: Once | INTRAVENOUS | Status: AC
  Administered 2023-12-23: 1000 mL via INTRAVENOUS

## 2023-12-23 MED ORDER — PROCHLORPERAZINE EDISYLATE 10 MG/2ML IJ SOLN
10.0000 mg | Freq: Once | INTRAMUSCULAR | Status: AC
  Administered 2023-12-23: 10 mg via INTRAVENOUS
  Filled 2023-12-23: qty 2

## 2023-12-23 MED ORDER — IOHEXOL 350 MG/ML SOLN
75.0000 mL | Freq: Once | INTRAVENOUS | Status: AC | PRN
  Administered 2023-12-23: 75 mL via INTRAVENOUS

## 2023-12-23 MED ORDER — ACETAMINOPHEN 10 MG/ML IV SOLN
1000.0000 mg | Freq: Four times a day (QID) | INTRAVENOUS | Status: AC
  Administered 2023-12-23 – 2023-12-24 (×2): 1000 mg via INTRAVENOUS
  Filled 2023-12-23 (×2): qty 100

## 2023-12-23 MED ORDER — ONDANSETRON HCL 4 MG/2ML IJ SOLN
4.0000 mg | Freq: Once | INTRAMUSCULAR | Status: AC
  Administered 2023-12-23: 4 mg via INTRAVENOUS
  Filled 2023-12-23: qty 2

## 2023-12-23 MED ORDER — SODIUM CHLORIDE 0.9 % IV SOLN: INTRAVENOUS | Status: DC

## 2023-12-23 MED ORDER — MIRTAZAPINE 15 MG PO TABS: 45.0000 mg | ORAL_TABLET | Freq: Every day | ORAL | Status: DC

## 2023-12-23 MED ORDER — SODIUM CHLORIDE 0.9 % IV BOLUS
1000.0000 mL | Freq: Once | INTRAVENOUS | Status: AC
  Administered 2023-12-23: 1000 mL via INTRAVENOUS

## 2023-12-23 MED ORDER — MAGNESIUM SULFATE 2 GM/50ML IV SOLN
2.0000 g | Freq: Once | INTRAVENOUS | Status: AC
  Administered 2023-12-23: 2 g via INTRAVENOUS
  Filled 2023-12-23: qty 50

## 2023-12-23 MED ORDER — OXYCODONE HCL 5 MG PO TABS
5.0000 mg | ORAL_TABLET | ORAL | Status: DC | PRN
  Administered 2023-12-23 – 2023-12-26 (×6): 5 mg via ORAL
  Filled 2023-12-23 (×6): qty 1

## 2023-12-23 MED ORDER — ONDANSETRON HCL 4 MG/2ML IJ SOLN
4.0000 mg | Freq: Three times a day (TID) | INTRAMUSCULAR | Status: DC
  Administered 2023-12-23: 4 mg via INTRAVENOUS
  Filled 2023-12-23: qty 2

## 2023-12-23 MED ORDER — FLUOXETINE HCL 20 MG PO CAPS
40.0000 mg | ORAL_CAPSULE | Freq: Every day | ORAL | Status: DC
  Administered 2023-12-23 – 2023-12-26 (×4): 40 mg via ORAL
  Filled 2023-12-23 (×4): qty 2

## 2023-12-23 MED ORDER — HYDROMORPHONE HCL 1 MG/ML IJ SOLN
1.0000 mg | Freq: Once | INTRAMUSCULAR | Status: AC
  Administered 2023-12-23: 1 mg via INTRAVENOUS
  Filled 2023-12-23: qty 1

## 2023-12-23 NOTE — ED Notes (Signed)
 CCMD called and pt placed on monitor

## 2023-12-23 NOTE — H&P (Addendum)
 History and Physical    Patient: Natalie Edwards FMW:[id number] DOB: [birth year] DOA: 12/23/2023 DOS: the patient was seen and examined on 12/23/2023 PCP: Tanda Bleacher, MD  Patient coming from: Home  Chief Complaint:  Chief Complaint  Patient presents with   Abdominal Pain   HPI: Natalie Edwards is a 58 y.o. female with medical history significant of diverticulitis, acute kidney injury, essential hypertension, iron  deficiency anemia from chronic blood loss, generalized anxiety disorder GERD, peptic ulcer disease with hemorrhage,, prediabetes, cocaine and alcohol abuse, rhabdomyolysis, major depression, history of suicidal ideation, recent admission on 8/20-26 for unknown drug overdose and required IP psych admission for monitoring as well as recent admission for diverticulitis from 8/7-8/15 p/w recurrent intractable n/v iso acute cystitis.  Pt reports that she has been unwell since her most recent discharge. She continues to endorse L sided abd pain that has caused decreasing PO intake; in fact, she reports no oral intake since this past Sunday. In light of her sx failing to improve, pt presented to the ED.  In the ED, pt hypertensive, and tachycardic. Labs notable for Na 147, K 2.7, Cr 1.2 (up from 0.96 on 10/6), lactic acid 2.2, and WBC 13.6. UA shows pyuria and bacteriuria. CT abdomen without acute finding (known diverticulosis, but no inflammation). EDP requested medicine admission.    Review of Systems: As mentioned in the history of present illness. All other systems reviewed and are negative. Past Medical History:  Diagnosis Date   Allergy    Anemia    Diverticulosis    Gall bladder stones 1993   Gastric ulcer    GERD (gastroesophageal reflux disease)    Hypertension    Renal mass    Past Surgical History:  Procedure Laterality Date   ABDOMINAL HYSTERECTOMY     BIOPSY  09/29/2022   Procedure: BIOPSY;  Surgeon: Shila Gustav GAILS, MD;  Location: [facility];  Service:  Gastroenterology;;   CHOLECYSTECTOMY     COLONOSCOPY WITH ESOPHAGOGASTRODUODENOSCOPY (EGD)  04/07/2023   [facility]   ESOPHAGOGASTRODUODENOSCOPY (EGD) WITH PROPOFOL  N/A 09/29/2022   Procedure: ESOPHAGOGASTRODUODENOSCOPY (EGD) WITH PROPOFOL ;  Surgeon: Shila Gustav GAILS, MD;  Location: [facility];  Service: Gastroenterology;  Laterality: N/A;   LAPAROSCOPIC SIGMOID COLECTOMY N/A 10/27/2023   Procedure: COLECTOMY, SIGMOID, LAPAROSCOPIC;  Surgeon: Lyndel Deward PARAS, MD;  Location: [facility] OR;  Service: General;  Laterality: N/A;   MOBILIZATION, SPLENIC FLEXURE, WITH PARTIAL COLECTOMY N/A 10/27/2023   Procedure: MOBILIZATION, SPLENIC FLEXURE;  Surgeon: Stechschulte, Deward PARAS, MD;  Location: [facility] OR;  Service: General;  Laterality: N/A;   Social History:  reports that she has been smoking cigarettes. She has a 12.5 pack-year smoking history. She has never used smokeless tobacco. She reports current alcohol use. She reports that she does not use drugs.  Allergies  Allergen Reactions   Hydroxyzine  Palpitations and Other (See Comments)    Jitteriness, too   Nsaids Other (See Comments)    History of ulcers   Ambien  [Zolpidem  Tartrate] Other (See Comments)    Jitteriness, nervousness, abdominal pain   Ibuprofen  Other (See Comments)    History of ulcers    Family History  Problem Relation Age of Onset   Cancer Maternal Uncle        Lung   Cancer Maternal Uncle        Lung   Cancer Maternal Uncle        Lung   Headache Neg Hx        I don't think so  Migraines Neg Hx        I don't think so    Prior to Admission medications   Medication Sig Start Date End Date Taking? Authorizing Provider  busPIRone  (BUSPAR ) 7.5 MG tablet Take 7.5 mg by mouth every morning. 11/10/23  Yes [provider]  ciprofloxacin  (CIPRO ) 500 MG tablet Take 1 tablet (500 mg total) by mouth every 12 (twelve) hours. 12/18/23  Yes Vicky Charleston, PA-C  FLUoxetine  (PROZAC ) 40 MG capsule Take 1 capsule (40  mg total) by mouth daily. Patient taking differently: Take 40 mg by mouth 2 (two) times daily. 11/10/23  Yes Donnelly Mellow, MD  hydrochlorothiazide  (HYDRODIURIL ) 25 MG tablet Take 25 mg by mouth daily. 11/20/23  Yes [provider]  HYDROcodone -acetaminophen  (NORCO/VICODIN) 5-325 MG tablet Take 1 tablet by mouth every 6 (six) hours as needed. Patient taking differently: Take 1 tablet by mouth every 6 (six) hours as needed for moderate pain (pain score 4-6) or severe pain (pain score 7-10). 12/20/23  Yes Aberman, Aleck BROCKS, PA-C  metoCLOPramide  (REGLAN ) 10 MG tablet Take 1 tablet (10 mg total) by mouth every 6 (six) hours. Patient taking differently: Take 10 mg by mouth daily as needed for nausea or vomiting. 12/20/23  Yes Aberman, Caroline C, PA-C  metroNIDAZOLE  (FLAGYL ) 500 MG tablet Take 1 tablet (500 mg total) by mouth 2 (two) times daily. 12/18/23  Yes Vicky Charleston, PA-C  mirtazapine  (REMERON ) 45 MG tablet Take 45 mg by mouth at bedtime. 12/16/23  Yes [provider]  propranolol  (INDERAL ) 10 MG tablet Take 10 mg by mouth daily. 11/10/23  Yes [provider]  QUEtiapine  (SEROQUEL ) 200 MG tablet Take 200 mg by mouth at bedtime. 12/10/23  Yes [provider]  temazepam  (RESTORIL ) 15 MG capsule Take 1 capsule (15 mg total) by mouth at bedtime as needed for sleep. 11/24/23  Yes Olalere, Adewale A, MD  traZODone  (DESYREL ) 100 MG tablet Take 100 mg by mouth at bedtime as needed for sleep. 12/10/23  Yes [provider]  diazepam  (VALIUM ) 2 MG tablet Take 2 mg by mouth daily as needed. Patient not taking: Reported on 12/23/2023 10/13/23   [provider]  hydrOXYzine  (ATARAX ) 25 MG tablet Take by mouth. Patient not taking: Reported on 12/23/2023 12/21/23   [provider]  nicotine  (NICODERM CQ  - DOSED IN MG/24 HOURS) 14 mg/24hr patch Place 1 patch (14 mg total) onto the skin daily at 6 (six) AM. Patient not taking: Reported on 12/23/2023 11/10/23    Jadapalle, Sree, MD  oxyCODONE -acetaminophen  (PERCOCET/ROXICET) 5-325 MG tablet Take 1-2 tablets by mouth every 6 (six) hours as needed for severe pain (pain score 7-10). Patient not taking: Reported on 12/23/2023 12/18/23   Vicky Charleston, PA-C  prochlorperazine  (COMPAZINE ) 10 MG tablet Take 10 mg by mouth 2 (two) times daily as needed. Patient not taking: Reported on 12/23/2023 12/19/23   [provider]  risperiDONE  (RISPERDAL ) 1 MG tablet Take 1 mg by mouth 2 (two) times daily. Patient not taking: Reported on 12/23/2023 11/10/23   [provider]    Physical Exam: Vitals:   12/23/23 0718 12/23/23 0830 12/23/23 0845 12/23/23 1035  BP:  (!) 173/90 (!) 174/82 (!) 183/84  Pulse:  (!) 104 95 (!) 104  Resp:  19 17 16   Temp: 98.2 F (36.8 C)   98.5 F (36.9 C)  TempSrc: Oral   Oral  SpO2:  99% 97% 100%  Weight:      Height:  General: Alert, oriented x3, resting comfortably in no acute distress HEENT: EOMI, oropharynx clear, moist mucous membranes, hearing intact Neck: Trachea midline and no gross thyromegaly Respiratory: Lungs clear to auscultation bilaterally with normal respiratory effort; no w/r/r Cardiovascular: Regular rate and rhythm w/o m/r/g Abdomen: Soft, nontender, nondistended. Positive bowel sounds MSK: No obvious joint deformities or swelling Skin: No obvious rashes or lesions Neurologic: Awake, alert, spontaneously moves all extremities, strength intact Psychiatric: Appropriate mood and affect, conversational and cooperative   Data Reviewed:  Lab Results  Component Value Date   WBC 13.6 (H) 12/23/2023   HGB 13.9 12/23/2023   HCT 41.0 12/23/2023   MCV 78.6 (L) 12/23/2023   PLT 471 (H) 12/23/2023   Lab Results  Component Value Date   GLUCOSE 129 (H) 12/23/2023   CALCIUM  9.1 12/23/2023   NA 147 (H) 12/23/2023   K 2.7 (LL) 12/23/2023   CO2 14 (L) 12/23/2023   CL 114 (H) 12/23/2023   BUN 4 (L) 12/23/2023   CREATININE 1.20 (H) 12/23/2023    Lab Results  Component Value Date   ALT 19 12/23/2023   AST 21 12/23/2023   ALKPHOS 50 12/23/2023   BILITOT 0.9 12/23/2023   Lab Results  Component Value Date   INR 1.1 05/24/2023   INR 1.1 09/28/2022   Radiology: CT ABDOMEN PELVIS W CONTRAST Result Date: 12/23/2023 CLINICAL DATA:  Left lower quadrant abdominal pain vomiting and weakness for 4 days EXAM: CT ABDOMEN AND PELVIS WITH CONTRAST TECHNIQUE: Multidetector CT imaging of the abdomen and pelvis was performed using the standard protocol following bolus administration of intravenous contrast. RADIATION DOSE REDUCTION: This exam was performed according to the departmental dose-optimization program which includes automated exposure control, adjustment of the mA and/or kV according to patient size and/or use of iterative reconstruction technique. CONTRAST:  75mL OMNIPAQUE  IOHEXOL  350 MG/ML SOLN COMPARISON:  12/20/2023 FINDINGS: Lower chest: No acute abnormality. Hepatobiliary: No focal liver abnormality is seen. Status post cholecystectomy. Unchanged postoperative biliary ductal dilatation. Pancreas: Unremarkable. No pancreatic ductal dilatation or surrounding inflammatory changes. Spleen: Normal in size without significant abnormality. Adrenals/Urinary Tract: Unchanged benign left adrenal adenoma. Normal right adrenal. Benign bilateral renal cortical and parapelvic cysts for which no specific follow-up is required. Kidneys are otherwise normal, without renal calculi, solid lesion, or hydronephrosis. Bladder is unremarkable. Stomach/Bowel: Stomach is within normal limits. Appendix appears normal. No evidence of bowel wall thickening, distention, or inflammatory changes. Descending and sigmoid diverticulosis. Rectosigmoid colon resection and reanastomosis. Vascular/Lymphatic: Severe aortic atherosclerosis. No enlarged abdominal or pelvic lymph nodes. Reproductive: Hysterectomy. Other: No abdominal wall hernia or abnormality. No ascites.  Musculoskeletal: No acute or significant osseous findings. IMPRESSION: 1. No acute CT findings of the abdomen or pelvis to explain left lower quadrant abdominal pain. 2. Descending and sigmoid diverticulosis without evidence of acute diverticulitis at this time, previous inflammatory findings of the descending colon resolved. 3. Rectosigmoid colon resection and reanastomosis. 4. Cholecystectomy and hysterectomy. Aortic Atherosclerosis (ICD10-I70.0). Electronically Signed   By: Marolyn JONETTA Jaksch M.D.   On: 12/23/2023 05:34    Assessment and Plan: 74F h/o diverticulitis, acute kidney injury, essential hypertension, iron  deficiency anemia from chronic blood loss, generalized anxiety disorder GERD, peptic ulcer disease with hemorrhage,, prediabetes, cocaine and alcohol abuse, rhabdomyolysis, major depression, history of suicidal ideation, recent admission on 8/20-26 for unknown drug overdose and required IP psych admission for monitoring (no medication changes) as well as recent admission for diverticulitis from 8/7-8/15 p/w AKI 2/2 recurrent intractable n/v iso acute cystitis.  AKI Presumed pre-renal per reported decreased PO intake -MIVF: NS at 125cc/h for 24h -Strict I&Os and daily weights (standing preferred) -F/u BMP daily -Renally dose medications for CrCl -Avoid lovenox , NSAIDs, morphine , Fleet's phosphate enema, regular insulin, contrast; no gadolinium for MRI to avoid nephrogenic systemic fibrosis -Consider renal US  and nephrology consult if worsening AKI  Recurrent intractable n/v Abd pain Abnl UA Presumed acute cystitis UA w/ bacteriuria and pyuria -IV CTX 1g daily for now -IV tylenol  1g QID for 1 day followed by PO tylenol  tomorrow -IV zofran  4mg  q6h prn -F/u urine culture and adjust abx accordingly -Full liquid diet for now; ADAT  Hypokalemia -PO Kdur 40mEq TID x 3 doses  H/o diverticulitis CT abdomen w/o diverticulitis -CTM  H/o HTN -HOLD pta hydrochlorothiazide  for now iso  AKI  Mood disorder -PTA trazodone , and fluoxetine  (NOTE: prior Buspar , Bentyl , Atarax , Remeron , Lyrica , Inderal , Risperdal , and Restoril  d/c on prior admission)   H/o unknown drug overdose -F/u UDS   Advance Care Planning:   Code Status: Full Code   Consults: N/A  Family Communication: N/A  Severity of Illness: The appropriate patient status for this patient is OBSERVATION. Observation status is judged to be reasonable and necessary in order to provide the required intensity of service to ensure the patient's safety. The patient's presenting symptoms, physical exam findings, and initial radiographic and laboratory data in the context of their medical condition is felt to place them at decreased risk for further clinical deterioration. Furthermore, it is anticipated that the patient will be medically stable for discharge from the hospital within 2 midnights of admission.    ------- I spent 55 minutes reviewing previous notes, at the bedside counseling/discussing the treatment plan, and performing clinical documentation.  Author: Marsha Ada, MD 12/23/2023 12:14 PM  For on call review [email or web address].

## 2023-12-23 NOTE — Plan of Care (Signed)
  Problem: Nutrition: Goal: Adequate nutrition will be maintained Outcome: Not Progressing   Problem: Coping: Goal: Level of anxiety will decrease Outcome: Not Progressing   Problem: Education: Goal: Knowledge of General Education information will improve Description: Including pain rating scale, medication(s)/side effects and non-pharmacologic comfort measures Outcome: Progressing   Problem: Health Behavior/Discharge Planning: Goal: Ability to manage health-related needs will improve Outcome: Progressing   Problem: Clinical Measurements: Goal: Ability to maintain clinical measurements within normal limits will improve Outcome: Progressing Goal: Will remain free from infection Outcome: Progressing Goal: Diagnostic test results will improve Outcome: Progressing Goal: Respiratory complications will improve Outcome: Progressing Goal: Cardiovascular complication will be avoided Outcome: Progressing   Problem: Activity: Goal: Risk for activity intolerance will decrease Outcome: Progressing   Problem: Elimination: Goal: Will not experience complications related to bowel motility Outcome: Progressing Goal: Will not experience complications related to urinary retention Outcome: Progressing   Problem: Pain Managment: Goal: General experience of comfort will improve and/or be controlled Outcome: Progressing   Problem: Safety: Goal: Ability to remain free from injury will improve Outcome: Progressing   Problem: Skin Integrity: Goal: Risk for impaired skin integrity will decrease Outcome: Progressing

## 2023-12-23 NOTE — ED Triage Notes (Signed)
 Pt bibgcems form home. Pt c.io Nausea vomiting and weakness x4 days. Pain 10/10  on left side of abdomin hr 140 went to 110 after fentanyl .  4mg  zofran , 50mcg fentanyl  given with ems.  Bp 210/100 Hr 116 Cbg 185 99% ra

## 2023-12-23 NOTE — ED Provider Notes (Signed)
 [place] [facility]ERGENCY DEPARTMENT AT [facility] Provider Note  CSN: [id number] Arrival date & time: 12/23/23 0345  Chief Complaint(s) Abdominal Pain  HPI Natalie Edwards is a 58 y.o. female with a past medical history listed below including GERD, gastric ulcers and recent diverticulitis diagnosed 5 days ago when treated with oral antibiotics.  Patient reports persistent left-sided pain requiring her to return to the emergency department 3 days ago where workup was reassuring with improving diverticulitis.  She reports that she has not been able to eat or drink anything since Sunday due to severity of the pain and decreased oral tolerance.  The history is provided by the patient.    Past Medical History Past Medical History:  Diagnosis Date   Allergy    Anemia    Diverticulosis    Gall bladder stones 1993   Gastric ulcer    GERD (gastroesophageal reflux disease)    Hypertension    Renal mass    Patient Active Problem List   Diagnosis Date Noted   MDD (major depressive disorder), recurrent severe, without psychosis (HCC) 11/04/2023   Abdominal pain 10/22/2023   MDD (major depressive disorder), recurrent episode, moderate (HCC) 10/08/2023   Alcohol abuse 10/08/2023   Suicidal ideations 10/08/2023   Pain in left shoulder 09/17/2023   Pain and swelling of lower extremity 05/28/2023   MDD (major depressive disorder), recurrent episode, severe (HCC) 05/26/2023   Ingestion of substance, intentional self-harm, sequela 05/23/2023   SIRS (systemic inflammatory response syndrome) (HCC) 05/23/2023   Rhabdomyolysis 05/23/2023   Cocaine abuse (HCC) 05/23/2023   Intractable nausea and vomiting 01/18/2023   MDD (major depressive disorder) 12/23/2022   Grief 12/22/2022   Diverticulitis 10/17/2022   Melena 09/29/2022   Gastric ulcer without hemorrhage or perforation 09/29/2022   AKI (acute kidney injury) 09/28/2022   Metabolic acidosis 09/28/2022   Epigastric pain 09/28/2022    Nausea 09/28/2022   Hyperlipidemia 12/14/2021   Prediabetes 12/14/2021   Anxiety and depression 05/14/2021   Breathing difficult 05/14/2021   GERD (gastroesophageal reflux disease)    Possible exposure to STD 01/14/2021   Chronic insomnia 10/18/2019   CKD (chronic kidney disease) 04/07/2019   GAD (generalized anxiety disorder) 05/07/2018   Chronic gastric ulcer 05/07/2018   Essential hypertension 05/07/2018   Iron  deficiency anemia due to chronic blood loss 09/23/2016   Peptic ulcer disease with hemorrhage 09/23/2016   Thrombocytosis 09/09/2016   Leukocytosis/thrombocytosis 09/09/2016   Gastric nodule 09/09/2016   H/O ulcer disease 07/31/2016   Home Medication(s) Prior to Admission medications   Medication Sig Start Date End Date Taking? Authorizing Provider  busPIRone  (BUSPAR ) 7.5 MG tablet Take 7.5 mg by mouth every morning. 11/10/23   [provider]  ciprofloxacin  (CIPRO ) 500 MG tablet Take 1 tablet (500 mg total) by mouth every 12 (twelve) hours. 12/18/23   Vicky Charleston, PA-C  diazepam  (VALIUM ) 2 MG tablet Take 2 mg by mouth daily as needed. 10/13/23   [provider]  FLUoxetine  (PROZAC ) 40 MG capsule Take 1 capsule (40 mg total) by mouth daily. 11/10/23   Donnelly Mellow, MD  hydrochlorothiazide  (HYDRODIURIL ) 25 MG tablet Take 25 mg by mouth daily. 11/20/23   [provider]  HYDROcodone -acetaminophen  (NORCO/VICODIN) 5-325 MG tablet Take 1 tablet by mouth every 6 (six) hours as needed. 12/20/23   Aberman, Caroline C, PA-C  metoCLOPramide  (REGLAN ) 10 MG tablet Take 1 tablet (10 mg total) by mouth every 6 (six) hours. 12/20/23   Aberman, Caroline C, PA-C  metroNIDAZOLE  (FLAGYL )  500 MG tablet Take 1 tablet (500 mg total) by mouth 2 (two) times daily. 12/18/23   Vicky Charleston, PA-C  mirtazapine  (REMERON ) 45 MG tablet Take 45 mg by mouth at bedtime. 12/16/23   [provider]  nicotine  (NICODERM CQ  - DOSED IN MG/24 HOURS) 14 mg/24hr patch Place 1  patch (14 mg total) onto the skin daily at 6 (six) AM. 11/10/23   Donnelly Mellow, MD  oxyCODONE -acetaminophen  (PERCOCET/ROXICET) 5-325 MG tablet Take 1-2 tablets by mouth every 6 (six) hours as needed for severe pain (pain score 7-10). 12/18/23   Vicky Charleston, PA-C  prochlorperazine  (COMPAZINE ) 10 MG tablet Take 10 mg by mouth 2 (two) times daily as needed. 12/19/23   [provider]  propranolol  (INDERAL ) 10 MG tablet Take 10 mg by mouth daily as needed. 11/10/23   [provider]  QUEtiapine  (SEROQUEL ) 200 MG tablet Take 200 mg by mouth at bedtime. 12/10/23   [provider]  risperiDONE  (RISPERDAL ) 1 MG tablet Take 1 mg by mouth 2 (two) times daily. 11/10/23   [provider]  temazepam  (RESTORIL ) 15 MG capsule Take 1 capsule (15 mg total) by mouth at bedtime as needed for sleep. 11/24/23   Olalere, Jennet LABOR, MD  traZODone  (DESYREL ) 100 MG tablet Take 100 mg by mouth at bedtime. 12/10/23   [provider]                                                                                                                                    Allergies Hydroxyzine , Nsaids, Ambien  [zolpidem  tartrate], and Ibuprofen   Review of Systems Review of Systems As noted in HPI  Physical Exam Vital Signs  I have reviewed the triage vital signs BP (!) 181/108   Pulse (!) 115   Temp 98.2 F (36.8 C) (Oral)   Resp 14   Ht 5' 3 (1.6 m)   Wt 77.6 kg   SpO2 98%   BMI 30.29 kg/m   Physical Exam Vitals reviewed.  Constitutional:      General: She is not in acute distress.    Appearance: She is well-developed. She is not diaphoretic.  HENT:     Head: Normocephalic and atraumatic.     Right Ear: External ear normal.     Left Ear: External ear normal.     Nose: Nose normal.  Eyes:     General: No scleral icterus.    Conjunctiva/sclera: Conjunctivae normal.  Neck:     Trachea: Phonation normal.  Cardiovascular:     Rate and Rhythm: Normal rate and regular  rhythm.  Pulmonary:     Effort: Pulmonary effort is normal. No respiratory distress.     Breath sounds: No stridor.  Abdominal:     General: There is no distension.     Tenderness: There is abdominal tenderness in the epigastric area, left upper quadrant and left lower quadrant.  Musculoskeletal:  General: Normal range of motion.     Cervical back: Normal range of motion.  Neurological:     Mental Status: She is alert and oriented to person, place, and time.  Psychiatric:        Behavior: Behavior normal.     ED Results and Treatments Labs (all labs ordered are listed, but only abnormal results are displayed) Labs Reviewed  COMPREHENSIVE METABOLIC PANEL WITH GFR - Abnormal; Notable for the following components:      Result Value   Potassium 2.7 (*)    Chloride 112 (*)    CO2 14 (*)    Glucose, Bld 128 (*)    BUN 5 (*)    Creatinine, Ser 1.35 (*)    GFR, Estimated 45 (*)    Anion gap 17 (*)    All other components within normal limits  CBC WITH DIFFERENTIAL/PLATELET - Abnormal; Notable for the following components:   WBC 13.6 (*)    MCV 78.6 (*)    RDW 22.5 (*)    Platelets 471 (*)    Neutro Abs 9.7 (*)    All other components within normal limits  URINALYSIS, W/ REFLEX TO CULTURE (INFECTION SUSPECTED) - Abnormal; Notable for the following components:   Specific Gravity, Urine 1.032 (*)    Hgb urine dipstick SMALL (*)    Ketones, ur 20 (*)    Protein, ur >=300 (*)    Leukocytes,Ua TRACE (*)    Bacteria, UA RARE (*)    All other components within normal limits  MAGNESIUM  - Abnormal; Notable for the following components:   Magnesium  1.5 (*)    All other components within normal limits  I-STAT CHEM 8, ED - Abnormal; Notable for the following components:   Sodium 147 (*)    Potassium 2.7 (*)    Chloride 114 (*)    BUN 4 (*)    Creatinine, Ser 1.20 (*)    Glucose, Bld 129 (*)    TCO2 18 (*)    All other components within normal limits  I-STAT CG4 LACTIC  ACID, ED - Abnormal; Notable for the following components:   Lactic Acid, Venous 2.2 (*)    All other components within normal limits  LIPASE, BLOOD                                                                                                                         EKG  EKG Interpretation Date/Time:  Wednesday December 23 2023 04:48:42 EDT Ventricular Rate:  106 PR Interval:  148 QRS Duration:  88 QT Interval:  358 QTC Calculation: 476 R Axis:   70  Text Interpretation: Sinus tachycardia Probable left atrial enlargement Borderline low voltage, extremity leads Confirmed by Trine Likes [phone]) on 12/23/2023 4:53:45 AM       Radiology CT ABDOMEN PELVIS W CONTRAST Result Date: 12/23/2023 CLINICAL DATA:  Left lower quadrant abdominal pain vomiting and weakness for 4 days EXAM: CT ABDOMEN AND PELVIS WITH CONTRAST TECHNIQUE: Multidetector  CT imaging of the abdomen and pelvis was performed using the standard protocol following bolus administration of intravenous contrast. RADIATION DOSE REDUCTION: This exam was performed according to the departmental dose-optimization program which includes automated exposure control, adjustment of the mA and/or kV according to patient size and/or use of iterative reconstruction technique. CONTRAST:  75mL OMNIPAQUE  IOHEXOL  350 MG/ML SOLN COMPARISON:  12/20/2023 FINDINGS: Lower chest: No acute abnormality. Hepatobiliary: No focal liver abnormality is seen. Status post cholecystectomy. Unchanged postoperative biliary ductal dilatation. Pancreas: Unremarkable. No pancreatic ductal dilatation or surrounding inflammatory changes. Spleen: Normal in size without significant abnormality. Adrenals/Urinary Tract: Unchanged benign left adrenal adenoma. Normal right adrenal. Benign bilateral renal cortical and parapelvic cysts for which no specific follow-up is required. Kidneys are otherwise normal, without renal calculi, solid lesion, or hydronephrosis. Bladder is  unremarkable. Stomach/Bowel: Stomach is within normal limits. Appendix appears normal. No evidence of bowel wall thickening, distention, or inflammatory changes. Descending and sigmoid diverticulosis. Rectosigmoid colon resection and reanastomosis. Vascular/Lymphatic: Severe aortic atherosclerosis. No enlarged abdominal or pelvic lymph nodes. Reproductive: Hysterectomy. Other: No abdominal wall hernia or abnormality. No ascites. Musculoskeletal: No acute or significant osseous findings. IMPRESSION: 1. No acute CT findings of the abdomen or pelvis to explain left lower quadrant abdominal pain. 2. Descending and sigmoid diverticulosis without evidence of acute diverticulitis at this time, previous inflammatory findings of the descending colon resolved. 3. Rectosigmoid colon resection and reanastomosis. 4. Cholecystectomy and hysterectomy. Aortic Atherosclerosis (ICD10-I70.0). Electronically Signed   By: Marolyn JONETTA Jaksch M.D.   On: 12/23/2023 05:34    Medications Ordered in ED Medications  sodium chloride  0.9 % bolus 1,000 mL (1,000 mLs Intravenous New Bag/Given 12/23/23 0415)    And  0.9 %  sodium chloride  infusion ( Intravenous New Bag/Given 12/23/23 0540)  sodium chloride  0.9 % bolus 1,000 mL (1,000 mLs Intravenous New Bag/Given 12/23/23 0658)    Followed by  0.9 %  sodium chloride  infusion (has no administration in time range)  magnesium  sulfate IVPB 2 g 50 mL (2 g Intravenous New Bag/Given 12/23/23 0734)  HYDROmorphone  (DILAUDID ) injection 0.5 mg (0.5 mg Intravenous Given 12/23/23 0418)  prochlorperazine  (COMPAZINE ) injection 10 mg (10 mg Intravenous Given 12/23/23 0420)  potassium chloride  10 mEq in 100 mL IVPB (0 mEq Intravenous Stopped 12/23/23 0745)  iohexol  (OMNIPAQUE ) 350 MG/ML injection 75 mL (75 mLs Intravenous Contrast Given 12/23/23 0523)  HYDROmorphone  (DILAUDID ) injection 1 mg (1 mg Intravenous Given 12/23/23 0625)  ondansetron  (ZOFRAN ) injection 4 mg (4 mg Intravenous Given 12/23/23 0623)    Procedures .Critical Care  Performed by: Trine Raynell Moder, MD Authorized by: Trine Raynell Moder, MD   Critical care provider statement:    Critical care time (minutes):  34   Critical care was necessary to treat or prevent imminent or life-threatening deterioration of the following conditions: severe dehydration requiring IVF.   Critical care was time spent personally by me on the following activities:  Development of treatment plan with patient or surrogate, discussions with consultants, evaluation of patient's response to treatment, examination of patient, ordering and review of laboratory studies, ordering and review of radiographic studies, ordering and performing treatments and interventions, pulse oximetry, re-evaluation of patient's condition and review of old charts   (including critical care time) Medical Decision Making / ED Course   Medical Decision Making Amount and/or Complexity of Data Reviewed Labs: ordered. Decision-making details documented in ED Course. Radiology: ordered and independent interpretation performed. Decision-making details documented in ED Course. ECG/medicine tests: ordered and independent interpretation performed.  Decision-making details documented in ED Course.  Risk Prescription drug management. Parenteral controlled substances. Decision regarding hospitalization.     Clinical Course as of 12/23/23 0749  Wed Dec 23, 2023  0401 Left-sided abdominal pain with intractable nausea and vomiting.  Differential diagnosis considered.  Workup below. [PC]  0451 CBC with Diff(!) CBC with leukocytosis.  Slightly worse than 5 days ago.  No anemia.    [PC]  0451 Comprehensive metabolic panel(!!) Hypokalemia at 2.7 likely from GI losses.  Mild hyperglycemia.  Acidosis with anion gap likely dehydration/starvation ketoacidosis.  Mild renal insufficiency without overt AKI.  Will replete potassium with IV runs. [PC]  [id number] I-Stat Lactic Acid,  ED(!!) Mildly elevated at 2.2 may be secondary to dehydration but will assess further for infectious sources. No obvious source at this time. [PC]  0627 CT without intra-abdominal inflammatory/infectious process or bowel obstruction.  UA without evidence of infection.  Patient significant pain patient still symptomatic with nausea.  Will admit for hydration  [PC]  [phone] Spoke with Dr. Georgina from Hospitalist service who will admit [PC]    Clinical Course User Index [PC] Alyia Lacerte, Raynell Moder, MD    Final Clinical Impression(s) / ED Diagnoses Final diagnoses:  Intractable nausea and vomiting  Left lower quadrant abdominal pain  Hypokalemia  Dehydration    This chart was dictated using voice recognition software.  Despite best efforts to proofread,  errors can occur which can change the documentation meaning.    Trine Raynell Moder, MD 12/23/23 [phone]

## 2023-12-24 DIAGNOSIS — F4325 Adjustment disorder with mixed disturbance of emotions and conduct: Secondary | ICD-10-CM | POA: Diagnosis not present

## 2023-12-24 DIAGNOSIS — R112 Nausea with vomiting, unspecified: Secondary | ICD-10-CM | POA: Diagnosis not present

## 2023-12-24 DIAGNOSIS — Z8659 Personal history of other mental and behavioral disorders: Secondary | ICD-10-CM | POA: Diagnosis not present

## 2023-12-24 DIAGNOSIS — F332 Major depressive disorder, recurrent severe without psychotic features: Secondary | ICD-10-CM | POA: Diagnosis not present

## 2023-12-24 LAB — CBC
HCT: 32.5 % — ABNORMAL LOW (ref 36.0–46.0)
Hemoglobin: 10.9 g/dL — ABNORMAL LOW (ref 12.0–15.0)
MCH: 26.7 pg (ref 26.0–34.0)
MCHC: 33.5 g/dL (ref 30.0–36.0)
MCV: 79.5 fL — ABNORMAL LOW (ref 80.0–100.0)
Platelets: 431 K/uL — ABNORMAL HIGH (ref 150–400)
RBC: 4.09 MIL/uL (ref 3.87–5.11)
RDW: 23.1 % — ABNORMAL HIGH (ref 11.5–15.5)
WBC: 13.8 K/uL — ABNORMAL HIGH (ref 4.0–10.5)
nRBC: 0 % (ref 0.0–0.2)

## 2023-12-24 LAB — BASIC METABOLIC PANEL WITH GFR
Anion gap: 10 (ref 5–15)
BUN: <5 mg/dL — ABNORMAL LOW (ref 6–20)
CO2: 18 mmol/L — ABNORMAL LOW (ref 22–32)
Calcium: 8.7 mg/dL — ABNORMAL LOW (ref 8.9–10.3)
Chloride: 112 mmol/L — ABNORMAL HIGH (ref 98–111)
Creatinine, Ser: 1.02 mg/dL — ABNORMAL HIGH (ref 0.44–1.00)
GFR, Estimated: >60 mL/min (ref >60)
Glucose, Bld: 113 mg/dL — ABNORMAL HIGH (ref 70–99)
Potassium: 4.3 mmol/L (ref 3.5–5.1)
Sodium: 140 mmol/L (ref 135–145)

## 2023-12-24 LAB — LACTIC ACID, PLASMA: Lactic Acid, Venous: 0.9 mmol/L (ref 0.5–1.9)

## 2023-12-24 LAB — MAGNESIUM: Magnesium: 1.8 mg/dL (ref 1.7–2.4)

## 2023-12-24 MED ORDER — SUCRALFATE 1 GM/10ML PO SUSP
1.0000 g | Freq: Three times a day (TID) | ORAL | Status: DC
  Administered 2023-12-24 – 2023-12-26 (×8): 1 g via ORAL
  Filled 2023-12-24 (×8): qty 10

## 2023-12-24 MED ORDER — PANTOPRAZOLE SODIUM 20 MG PO TBEC
20.0000 mg | DELAYED_RELEASE_TABLET | Freq: Every day | ORAL | Status: DC
  Administered 2023-12-24 – 2023-12-25 (×2): 20 mg via ORAL
  Filled 2023-12-24 (×3): qty 1

## 2023-12-24 MED ORDER — HYDRALAZINE HCL 20 MG/ML IJ SOLN: 10.0000 mg | Freq: Four times a day (QID) | INTRAMUSCULAR | Status: DC | PRN

## 2023-12-24 MED ORDER — QUETIAPINE FUMARATE 100 MG PO TABS
200.0000 mg | ORAL_TABLET | Freq: Every day | ORAL | Status: DC
  Administered 2023-12-24 – 2023-12-25 (×2): 200 mg via ORAL
  Filled 2023-12-24 (×2): qty 2

## 2023-12-24 MED ORDER — NICOTINE 14 MG/24HR TD PT24
14.0000 mg | MEDICATED_PATCH | Freq: Every day | TRANSDERMAL | Status: DC
Start: 2023-12-24 — End: 2023-12-26
  Administered 2023-12-24 – 2023-12-26 (×3): 14 mg via TRANSDERMAL
  Filled 2023-12-24 (×3): qty 1

## 2023-12-24 NOTE — Hospital Course (Addendum)
 Patient is a 58 year old female with past medical history significant for diverticulitis, hypertension, IDA secondary to chronic blood loss, PUD with hemorrhage, GERD, GAD, MDD, prediabetes, cocaine and alcohol abuse, history of suicidal ideation, who was recently admitted on 8/20 to 8/26 for unknown drug overdose and required IP psych admission for monitoring as well as recent admission for diverticulitis from 8/7 to 8/15.  Patient presents to the ED on 12/23/2023 with complaints of recurrent intractable nausea and vomiting in the setting of acute cystitis.

## 2023-12-24 NOTE — Consult Note (Signed)
 [facility] Psychiatric Consult Initial  Patient Name: .Natalie Edwards  MRN: [id number]  DOB: [birth year]  Consult Order details:  Orders (From admission, onward)     Start     Ordered   12/24/23 0819  IP CONSULT TO PSYCHIATRY       Ordering Provider: Al-Sultani, Anmar, MD  Provider:  (Not yet assigned)  Question Answer Comment  Location [facility]   Reason for Consult? Patient requesting [facility] consult for anxiety management, was recently in for suicide attempt with multiple medication changes      12/24/23 0822             Mode of Visit: In person    Psychiatry Consult Evaluation  Service Date: December 24, 2023 LOS:  LOS: 0 days  Chief Complaint the patient is a 58 year old female who was admitted for abdominal pain with nausea and vomiting.  She complains of depression and requested to be seen by psychiatry.  Primary Psychiatric Diagnoses  Major depression recurrent moderately severe without psychosis 2.  Adjustment disorder with mixed emotional features 3.  History of cocaine and alcohol use disorder  Assessment  Natalie Edwards is a 58 y.o. female admitted: Medicallyfor 12/23/2023  3:45 AM for nausea and vomiting. She carries the psychiatric diagnoses of major depression recurrent without psychosis and has a past medical history of hypertension, tachycardia diverticulitis acute kidney injury, iron  deficiency anemia GERD and peptic ulcer disease.  Rhabdo myelosis..   Her current presentation of anxiety, depression and feelings of helplessness and worthlessness are consistent with major depression, recurrent with no active suicidal or homicidal ideations or psychosis.  She meets the criteria based on history and current presentation.  Her outpatient medications include fluoxetine  40 mg a day, Remeron  45 mg at night and trazodone .  She has been compliant with medications.  On examination the patient is alert oriented and cooperative and reports that she feels much  better today as compared to yesterday.  She reports that she lives alone and feels very lonely and has no activities and she just wanted to talk to someone.  During her current presentation she denies any active suicidal or homicidal ideations or psychosis and denies abusing drugs prior to admission.  She reports that since her last admission in August she has been doing well on current medication and has a therapist and a psychiatrist.  She just wanted to touch base with a psychiatrist while she was in the hospital and wants to ensure that she gets some additional resources upon discharge. Hopeful recommendation please see below.  Diagnoses:  Active Hospital problems: Principal Problem:   Intractable nausea and vomiting    Plan   ## Psychiatric Medication Recommendations:  Continue with the current psychotropics that include Prozac  40 mg a day Trazodone  100 mg at night Patient may have been on Remeron  45 mg at night, please verify that she was taking this at home and if so may need to continue this.  ## Medical Decision Making Capacity: Not specifically addressed in this encounter  ## Further Work-up:  -- None noted EKG -- most recent EKG on 12/23/2023 had QtC of 476 -- Pertinent labwork reviewed earlier this admission includes: As per the hospitalist   ## Disposition:-- Plan Post Discharge/Psychiatric Care Follow-up resources please schedule outpatient follow-up appointment with her therapist immediately upon discharge.  Patient would also benefit from resources for her to get a part-time job and may be a senior center close to home.  ## Behavioral / Environmental: -  Utilize compassion and acknowledge the patient's experiences while setting clear and realistic expectations for care.    ## Safety and Observation Level:  - Based on my clinical evaluation, I estimate the patient to be at low risk of self harm in the current setting. - At this time, we recommend  routine. This decision  is based on my review of the chart including patient's history and current presentation, interview of the patient, mental status examination, and consideration of suicide risk including evaluating suicidal ideation, plan, intent, suicidal or self-harm behaviors, risk factors, and protective factors. This judgment is based on our ability to directly address suicide risk, implement suicide prevention strategies, and develop a safety plan while the patient is in the clinical setting. Please contact our team if there is a concern that risk level has changed.  CSSR Risk Category:C-SSRS RISK CATEGORY: No Risk  Suicide Risk Assessment: Patient has following modifiable risk factors for suicide: under treated depression  and recent psychiatric hospitalization, which we are addressing by outpatient follow-up and continuation of medications.. Patient has following non-modifiable or demographic risk factors for suicide: psychiatric hospitalization Patient has the following protective factors against suicide: Access to outpatient mental health care  Thank you for this consult request. Recommendations have been communicated to the primary team.  We see your 1 more time to ensure that the patient is on medications and then most likely sign off in the AM.  PAULETTE BEETS, MD       History of Present Illness  Relevant Aspects of [facility] Course:  Admitted on 12/23/2023 for nausea and vomiting. They were continuing further workup..   Patient Report:  12/24/2023:  The patient is a 58 year old African-American female who is a good historian.  She reports that since her discharge from [facility] in August she has been doing fairly well and taking her medications and has had a outpatient therapist and a psychiatrist.  She denies any side effects on medications.  She reports that her main problem is loneliness.  She lives alone and although she has 3 children they do not live in the state.  She has a lot of time on  her hands and she has been trying to get a part-time job if possible.  She reports that she will do a lot better if she had some activities to do.  When seen today she was alert oriented cooperative and pleasant and mildly tearful but denies any auditory or visual hallucinations or delusions and she denies any SI/HI/AVH.  He is contracting for safety and reports that she just needed someone to talk to.  She reports that she has been compliant with her medications.  Psych ROS:  Depression: Recurrent Anxiety: History of recurrent anxiety Mania (lifetime and current): Denies Psychosis: (lifetime and current): Denies  Collateral information:  No collateral available patient lives alone.  Review of Systems  Psychiatric/Behavioral:  Positive for depression. The patient is nervous/anxious.      Psychiatric and Social History  Psychiatric History:  Information collected from patient's records and interview with the patient.   Prev Dx/Sx: Depression and anxiety Current Psych Provider: Unable to recall Home Meds (current): Prozac , Remeron , Lyrica , Risperdal  Previous Med Trials: Allergic to Ambien  Therapy: None reported   Prior Psych Hospitalization: Twice, most recent one in August 2025. 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 Alcohol: 1 bottle of wine per night sometimes Type of alcohol wine Last Drink the week before hospitalization Number of drinks per day as above History of alcohol withdrawal seizures  denies History of DT's denies Tobacco: 1 pack/day Illicit drugs: Cocaine intermittently, 1 week before hospitalization, a small bag Prescription drug abuse: Denies Rehab hx: Denies  Exam Findings  Physical Exam: As per the hospitalist Vital Signs:  Temp:  [98.2 F (36.8 C)-99.1 F (37.3 C)] 98.2 F (36.8 C) (10/09 0903) Pulse Rate:  [88-106] 88 (10/09 0903) Resp:  [17-20] 18 (10/09 0903) BP: (158-188)/(83-100) 168/83 (10/09 0903) SpO2:  [94 %-100 %] 94 % (10/09 0903) Blood pressure (!) 168/83, pulse 88, temperature 98.2 F (36.8 C), resp. rate 18, height 5' 3 (1.6 m), weight 77.6 kg, SpO2 94%. Body mass index is 30.29 kg/m.  Physical Exam Neurological:     General: No focal deficit present.     Mental Status: She is oriented to person, place, and time.  Psychiatric:        Behavior: Behavior normal.     Mental Status Exam: General Appearance: Disheveled  Orientation:  Full (Time, Place, and Person)  Memory:  Immediate;   Fair Recent;   Fair Remote;   Fair  Concentration:  Concentration: Fair and Attention Span: Fair  Recall:  Fair  Attention  Good  Eye Contact:  Good  Speech:  Normal Rate  Language:  Good  Volume:  Decreased  Mood: Depressive  Affect:  Constricted  Thought Process:  Coherent  Thought Content:  Logical  Suicidal Thoughts:  No  Homicidal Thoughts:  No  Judgement:  Intact  Insight:  Fair  Psychomotor Activity:  Decreased  Akathisia:  No  Fund of Knowledge:  Fair      Assets:  Manufacturing systems engineer Housing Leisure Time Resilience  Cognition:  WNL  ADL's:  Intact  AIMS (if indicated):        Other History   These have been pulled in through the EMR, reviewed, and updated if appropriate.  Family History:  The patient's family history includes Cancer in her maternal uncle, maternal uncle, and maternal uncle.  Medical History: Past Medical History:  Diagnosis Date   Allergy    Anemia    Diverticulosis    Gall bladder stones 1993    Gastric ulcer    GERD (gastroesophageal reflux disease)    Hypertension    Renal mass     Surgical History: Past Surgical History:  Procedure Laterality Date   ABDOMINAL HYSTERECTOMY     BIOPSY  09/29/2022   Procedure: BIOPSY;  Surgeon: Shila Gustav GAILS, MD;  Location: [facility] ENDOSCOPY;  Service: Gastroenterology;;   CHOLECYSTECTOMY     COLONOSCOPY WITH ESOPHAGOGASTRODUODENOSCOPY (EGD)  04/07/2023   [facility]   ESOPHAGOGASTRODUODENOSCOPY (EGD) WITH PROPOFOL  N/A 09/29/2022   Procedure: ESOPHAGOGASTRODUODENOSCOPY (EGD) WITH PROPOFOL ;  Surgeon: Shila Gustav GAILS, MD;  Location: [facility] ENDOSCOPY;  Service: Gastroenterology;  Laterality: N/A;   LAPAROSCOPIC SIGMOID COLECTOMY N/A 10/27/2023   Procedure: COLECTOMY, SIGMOID, LAPAROSCOPIC;  Surgeon: Lyndel Deward PARAS, MD;  Location: [facility] OR;  Service: General;  Laterality: N/A;   MOBILIZATION, SPLENIC FLEXURE, WITH PARTIAL COLECTOMY N/A 10/27/2023   Procedure: MOBILIZATION, SPLENIC FLEXURE;  Surgeon: Stechschulte,  Deward PARAS, MD;  Location: [facility] OR;  Service: General;  Laterality: N/A;     Medications:   Current Facility-Administered Medications:    [COMPLETED] sodium chloride  0.9 % bolus 1,000 mL, 1,000 mL, Intravenous, Once, Stopped at 12/23/23 0834 **FOLLOWED BY** 0.9 %  sodium chloride  infusion, 1,000 mL, Intravenous, Continuous, Cardama, Raynell Moder, MD   acetaminophen  (OFIRMEV ) IV 1,000 mg, 1,000 mg, Intravenous, Q6H, Georgina Basket, MD, Last Rate: 400 mL/hr at 12/24/23 0539, 1,000 mg at 12/24/23 0539   acetaminophen  (TYLENOL ) tablet 1,000 mg, 1,000 mg, Oral, TID, Georgina Basket, MD, 1,000 mg at 12/24/23 9188   cefTRIAXone  (ROCEPHIN ) 1 g in sodium chloride  0.9 % 100 mL IVPB, 1 g, Intravenous, Q24H, Georgina Basket, MD, Last Rate: 200 mL/hr at 12/24/23 1125, 1 g at 12/24/23 1125   enoxaparin  (LOVENOX ) injection 40 mg, 40 mg, Subcutaneous, Daily, Georgina Basket, MD, 40 mg at 12/24/23 9188   FLUoxetine  (PROZAC ) capsule 40 mg, 40 mg, Oral, Daily,  Georgina Basket, MD, 40 mg at 12/24/23 9188   nicotine  (NICODERM CQ  - dosed in mg/24 hours) patch 14 mg, 14 mg, Transdermal, Daily, Al-Sultani, Anmar, MD, 14 mg at 12/24/23 0849   ondansetron  (ZOFRAN ) injection 4 mg, 4 mg, Intravenous, Q6H PRN, Georgina Basket, MD, 4 mg at 12/24/23 0820   oxyCODONE  (Oxy IR/ROXICODONE ) immediate release tablet 5 mg, 5 mg, Oral, Q4H PRN, Georgina Basket, MD, 5 mg at 12/23/23 2154   traZODone  (DESYREL ) tablet 100 mg, 100 mg, Oral, QHS PRN, Georgina Basket, MD, 100 mg at 12/23/23 2154  Allergies: Allergies  Allergen Reactions   Hydroxyzine  Palpitations and Other (See Comments)    Jitteriness, too   Nsaids Other (See Comments)    History of ulcers   Ambien  [Zolpidem  Tartrate] Other (See Comments)    Jitteriness, nervousness, abdominal pain   Ibuprofen  Other (See Comments)    History of ulcers    PAULETTE BEETS, MD

## 2023-12-24 NOTE — Plan of Care (Signed)
  Problem: Activity: Goal: Risk for activity intolerance will decrease Outcome: Not Progressing   Problem: Coping: Goal: Level of anxiety will decrease Outcome: Not Progressing   Problem: Pain Managment: Goal: General experience of comfort will improve and/or be controlled Outcome: Not Progressing   Problem: Education: Goal: Knowledge of General Education information will improve Description: Including pain rating scale, medication(s)/side effects and non-pharmacologic comfort measures Outcome: Progressing   Problem: Health Behavior/Discharge Planning: Goal: Ability to manage health-related needs will improve Outcome: Progressing   Problem: Clinical Measurements: Goal: Ability to maintain clinical measurements within normal limits will improve Outcome: Progressing Goal: Will remain free from infection Outcome: Progressing Goal: Diagnostic test results will improve Outcome: Progressing Goal: Respiratory complications will improve Outcome: Progressing Goal: Cardiovascular complication will be avoided Outcome: Progressing   Problem: Nutrition: Goal: Adequate nutrition will be maintained Outcome: Progressing   Problem: Elimination: Goal: Will not experience complications related to bowel motility Outcome: Progressing Goal: Will not experience complications related to urinary retention Outcome: Progressing   Problem: Safety: Goal: Ability to remain free from injury will improve Outcome: Progressing   Problem: Skin Integrity: Goal: Risk for impaired skin integrity will decrease Outcome: Progressing

## 2023-12-24 NOTE — Progress Notes (Signed)
 Progress Note   Patient: Natalie Edwards FMW:[id number] DOB: 1966-02-03 DOA: 12/23/2023     0 DOS: the patient was seen and examined on 12/24/2023   Brief hospital course: Patient is a 58 year old female with past medical history significant for diverticulitis, hypertension, IDA secondary to chronic blood loss, PUD with hemorrhage, GERD, GAD, MDD, prediabetes, cocaine and alcohol abuse, history of suicidal ideation, who was recently admitted on 8/20 to 8/26 for unknown drug overdose and required IP psych admission for monitoring as well as recent admission for diverticulitis from 8/7 to 8/15.  Patient presents to the ED on 12/23/2023 with complaints of recurrent intractable nausea and vomiting in the setting of acute cystitis.   Assessment and Plan:  #AKI - resolved - Cr elevated to 1.35 on admission, baseline around 0.9-1.1.  - Likely prerenal in the setting of decreased p.o. intake and intractable nausea and vomiting - Was started on NS at 125 mL an hour for 24 hours on admission - Cr improved to 1.02 today today - Will allow IVF to expire as patient's PO intake is improving at this time  # Recurrent intractable nausea vomiting # Abdominal pain # GERD # History of PUD with hemorrhage - CT abdomen pelvis (10/9) showed no acute abdominal or pelvic findings to explain left lower quadrant abdominal pain, noted descending and sigmoid diverticulosis without evidence of acute diverticulitis at this time with resolution of previous inflammatory findings of the descending colon.  Also noted rectosigmoid colon resection and reanastomosis.  Cholecystectomy and hysterectomy. - Denies NSAID use - Patient appears to be improving, tolerated full liquid diet today.  However, complains of nausea and abdominal pain after soft regular diet. - Continue as needed Zofran  4 mg q6h - Started Protonix  20 mg daily and Carafate  QID  # Possible acute cystitis - Possible UTI in the setting of lower abdominal pain and UA  positive for trace leukocytes and rare bacteria - Per chart review, patient had acute cystitis with ESBL E. coli and Klebsiella aerogenes in 05/2023. Discussion with ID at the time indicated ESBL E. Coli was likely a contaminant/colonizer, with Klebsiella being treated with IV Rocephin  then p.o. Cipro  on discharge - Afebrile, WBC elevated but stable at 13.8 today. - Urine culture not obtained on admission - Will continue IV Rocephin  for now and assess clinical response  #Hypertension - Home HCTZ was held on admission due to AKI - Blood pressure elevated today ranging from 158-178 systolic - Started PRN IV hydralazine  10 mg every 6 hours for SBP greater than 160 and/or DBP greater than 110 - Plan to resume home antihypertensives when AKI resolves  # Anion gap metabolic acidosis - resolved # Lactic acidosis - resolved - Lactate elevated to 2.2 on admission, bicarb of 17, with an anion gap of 17.  - Lactate improved to 0.9 with IVFs - Bicarb improved to 18, anion gap closed at 10  # Hypokalemia - resolved - K 2.7 on admission - K 4.3 today  #Hypomagnesemia - resolved - Mg 1.5 on admission - Mg 1.8 today  # Tobacco abuse - Smokes 0.5 PPD - Smoking cessation counseling provided - Nicotine  patch ordered  # Anxiety # MDD # Previous SI  - Patient with history of GAD, MDD, and previous SI. Had multiple medication changes and discontinuations during prior admissions. She is requesting to speak to the psychiatry team regarding her anxiety and medication management. - Psychiatry consulted  Subjective: Patient evaluated at bedside today.  No acute events overnight per patient  or nursing staff.  This morning, the patient tolerated a full liquid diet very well and was encouraged to advance to a soft regular diet.  However, unfortunately, after consuming a soft regular diet, she began experiencing nausea and abdominal pain again.  However, she denies any vomiting episodes since her admission.   Despite that, she reports wanting to continue trialing a soft regular diet and does not want to go back to a full liquid diet.    Physical Exam: Vitals:   12/23/23 2000 12/24/23 0000 12/24/23 0500 12/24/23 0903  BP: (!) 174/89 (!) 158/96 (!) 178/100 (!) 168/83  Pulse: (!) 101 (!) 105 (!) 106 88  Resp: 20 18 17 18   Temp: 99 F (37.2 C) 99.1 F (37.3 C) 98.5 F (36.9 C) 98.2 F (36.8 C)  TempSrc: Oral Oral Oral   SpO2: 99% 100% 100% 94%  Weight:      Height:       Physical Exam Constitutional:      General: She is not in acute distress.    Appearance: She is not ill-appearing.  HENT:     Mouth/Throat:     Mouth: Mucous membranes are dry.  Cardiovascular:     Rate and Rhythm: Normal rate and regular rhythm.     Heart sounds: Normal heart sounds. No murmur heard. Pulmonary:     Effort: Pulmonary effort is normal. No respiratory distress.     Breath sounds: Normal breath sounds. No wheezing.  Abdominal:     General: Bowel sounds are normal. There is no distension.     Palpations: Abdomen is soft.     Tenderness: There is abdominal tenderness (Mild left-sided abdominal tenderness). There is no guarding.  Musculoskeletal:     Right lower leg: No edema.     Left lower leg: No edema.  Skin:    General: Skin is warm and dry.     Capillary Refill: Capillary refill takes less than 2 seconds.  Neurological:     Mental Status: She is alert and oriented to person, place, and time. Mental status is at baseline.     Family Communication: Only discussed with the patient  Disposition: Status is: Observation The patient will require care spanning > 2 midnights and should be moved to inpatient because: Continues to be unable to tolerate regular diet without nausea and abdominal pain Planned Discharge Destination: Home  DVT prophylaxis: enoxaparin  (LOVENOX ) injection 40 mg Start: 12/23/23 1000   Time spent: 40 minutes    Author: Duffy Larch, MD 12/24/2023 11:39 AM  For  on call review [email or web address].

## 2023-12-24 NOTE — Plan of Care (Signed)

## 2023-12-25 DIAGNOSIS — R1032 Left lower quadrant pain: Secondary | ICD-10-CM

## 2023-12-25 DIAGNOSIS — F332 Major depressive disorder, recurrent severe without psychotic features: Secondary | ICD-10-CM | POA: Diagnosis not present

## 2023-12-25 DIAGNOSIS — F4325 Adjustment disorder with mixed disturbance of emotions and conduct: Secondary | ICD-10-CM | POA: Diagnosis not present

## 2023-12-25 DIAGNOSIS — Z9049 Acquired absence of other specified parts of digestive tract: Secondary | ICD-10-CM | POA: Diagnosis not present

## 2023-12-25 DIAGNOSIS — Z8711 Personal history of peptic ulcer disease: Secondary | ICD-10-CM

## 2023-12-25 DIAGNOSIS — Z8659 Personal history of other mental and behavioral disorders: Secondary | ICD-10-CM | POA: Diagnosis not present

## 2023-12-25 DIAGNOSIS — K317 Polyp of stomach and duodenum: Secondary | ICD-10-CM

## 2023-12-25 DIAGNOSIS — R112 Nausea with vomiting, unspecified: Secondary | ICD-10-CM | POA: Diagnosis not present

## 2023-12-25 LAB — CBC
HCT: 30.9 % — ABNORMAL LOW (ref 36.0–46.0)
Hemoglobin: 10.1 g/dL — ABNORMAL LOW (ref 12.0–15.0)
MCH: 26.3 pg (ref 26.0–34.0)
MCHC: 32.7 g/dL (ref 30.0–36.0)
MCV: 80.5 fL (ref 80.0–100.0)
Platelets: 401 K/uL — ABNORMAL HIGH (ref 150–400)
RBC: 3.84 MIL/uL — ABNORMAL LOW (ref 3.87–5.11)
RDW: 23.4 % — ABNORMAL HIGH (ref 11.5–15.5)
WBC: 10.6 K/uL — ABNORMAL HIGH (ref 4.0–10.5)
nRBC: 0 % (ref 0.0–0.2)

## 2023-12-25 LAB — IRON AND TIBC
Iron: 29 ug/dL (ref 28–170)
Saturation Ratios: 11 % (ref 10.4–31.8)
TIBC: 276 ug/dL (ref 250–450)
UIBC: 247 ug/dL

## 2023-12-25 LAB — CULTURE, BLOOD (ROUTINE X 2)
Culture: NO GROWTH
Culture: NO GROWTH
Special Requests: ADEQUATE

## 2023-12-25 LAB — RETICULOCYTES
Immature Retic Fract: 11.5 % (ref 2.3–15.9)
RBC.: 3.86 MIL/uL — ABNORMAL LOW (ref 3.87–5.11)
Retic Count, Absolute: 40.1 K/uL (ref 19.0–186.0)
Retic Ct Pct: 1 % (ref 0.4–3.1)

## 2023-12-25 LAB — FERRITIN: Ferritin: 33 ng/mL (ref 11–307)

## 2023-12-25 LAB — BASIC METABOLIC PANEL WITH GFR
Anion gap: 11 (ref 5–15)
BUN: <5 mg/dL — ABNORMAL LOW (ref 6–20)
CO2: 19 mmol/L — ABNORMAL LOW (ref 22–32)
Calcium: 8.6 mg/dL — ABNORMAL LOW (ref 8.9–10.3)
Chloride: 111 mmol/L (ref 98–111)
Creatinine, Ser: 1.03 mg/dL — ABNORMAL HIGH (ref 0.44–1.00)
GFR, Estimated: >60 mL/min (ref >60)
Glucose, Bld: 88 mg/dL (ref 70–99)
Potassium: 3.7 mmol/L (ref 3.5–5.1)
Sodium: 141 mmol/L (ref 135–145)

## 2023-12-25 LAB — VITAMIN B12: Vitamin B-12: 254 pg/mL (ref 180–914)

## 2023-12-25 LAB — FOLATE: Folate: 6.2 ng/mL (ref >5.9)

## 2023-12-25 MED ORDER — TRAZODONE HCL 50 MG PO TABS
50.0000 mg | ORAL_TABLET | Freq: Every evening | ORAL | Status: DC | PRN
Start: 2023-12-25 — End: 2023-12-26
  Administered 2023-12-25: 50 mg via ORAL
  Filled 2023-12-25: qty 1

## 2023-12-25 MED ORDER — MIRTAZAPINE 15 MG PO TABS
45.0000 mg | ORAL_TABLET | Freq: Every day | ORAL | Status: DC
  Administered 2023-12-25: 45 mg via ORAL
  Filled 2023-12-25: qty 3

## 2023-12-25 MED ORDER — DICYCLOMINE HCL 10 MG PO CAPS
10.0000 mg | ORAL_CAPSULE | Freq: Three times a day (TID) | ORAL | Status: DC
  Administered 2023-12-26 (×2): 10 mg via ORAL
  Filled 2023-12-25 (×3): qty 1

## 2023-12-25 MED ORDER — PANTOPRAZOLE SODIUM 40 MG PO TBEC
40.0000 mg | DELAYED_RELEASE_TABLET | Freq: Two times a day (BID) | ORAL | Status: DC
  Administered 2023-12-25 – 2023-12-26 (×2): 40 mg via ORAL
  Filled 2023-12-25 (×2): qty 1

## 2023-12-25 MED ORDER — ONDANSETRON HCL 4 MG/2ML IJ SOLN
4.0000 mg | Freq: Three times a day (TID) | INTRAMUSCULAR | Status: DC
  Administered 2023-12-25 – 2023-12-26 (×3): 4 mg via INTRAVENOUS
  Filled 2023-12-25 (×3): qty 2

## 2023-12-25 NOTE — Progress Notes (Signed)
 Progress Note   Patient: Natalie Edwards FMW:[id number] DOB: 12/10/1965 DOA: 12/23/2023     0 DOS: the patient was seen and examined on 12/25/2023   Brief hospital course: Patient is a 58 year old female with past medical history significant for diverticulitis, hypertension, IDA secondary to chronic blood loss, PUD with hemorrhage, GERD, GAD, MDD, prediabetes, cocaine and alcohol abuse, history of suicidal ideation, who was recently admitted on 8/20 to 8/26 for unknown drug overdose and required IP psych admission for monitoring as well as recent admission for diverticulitis from 8/7 to 8/15.  Patient presents to the ED on 12/23/2023 with complaints of recurrent intractable nausea and vomiting in the setting of acute cystitis.   Assessment and Plan:  #AKI - resolved - Cr elevated to 1.35 on admission, baseline around 0.9-1.1.  - Likely prerenal in the setting of decreased p.o. intake and intractable nausea and vomiting. Resolved with IVFs.  - Cr 1.03 today  # Postprandial nausea # Abdominal pain # GERD # History of PUD with hemorrhage # ? Melena - CT abdomen pelvis (10/9) showed no acute abdominal or pelvic findings to explain left lower quadrant abdominal pain, noted descending and sigmoid diverticulosis without evidence of acute diverticulitis at this time with resolution of previous inflammatory findings of the descending colon.  Also noted rectosigmoid colon resection and reanastomosis.  Cholecystectomy and hysterectomy. - Denies NSAID use - Patient improved since admission with no episodes of vomiting.  However, continues to complain of nausea and abdominal pain after soft regular diet. - Continue as needed Zofran  4 mg q6h - Continue Carafate  QID - Increased Protonix  to 40 mg BID (this appears to be the patient's home dose per dispense history) - Requested RN to obtain FOBT when patient has a bowel movement - Anemia panel ordered - Per chart review, patient's per chart review, patient's  symptoms appear to be chronic in nature, was last seen by GI in 08/2023 with similar complaints.  Spoke with Amy, GI NP, about patient's current possible melena.  Agreed to evaluate patient.  # Possible acute cystitis - Possible UTI in the setting of lower abdominal pain and UA positive for trace leukocytes and rare bacteria - Per chart review, patient had acute cystitis with ESBL E. coli and Klebsiella aerogenes in 05/2023. Discussion with ID at the time indicated ESBL E. Coli was likely a contaminant/colonizer, with Klebsiella being treated with IV Rocephin  then p.o. Cipro  on discharge - Afebrile, leukocytosis improved to 10.6 - Urine culture not obtained on admission - Will continue IV Rocephin  for 5 days  #Hypertension - Home HCTZ was held on admission due to AKI - Blood pressure within acceptable range - PRN IV hydralazine  10 mg every 6 hours for SBP greater than 160 and/or DBP greater than 110 - Plan to resume home antihypertensives when AKI resolves  # Anion gap metabolic acidosis - resolved # Lactic acidosis - resolved - Lactate elevated to 2.2 on admission, bicarb of 17, with an anion gap of 17.  - Lactate improved to 0.9 with IVFs  # Hypokalemia - resolved  #Hypomagnesemia - resolved  # Tobacco abuse - Smokes 0.5 PPD - Smoking cessation counseling provided - Nicotine  patch  # Anxiety # MDD # Previous SI  - Patient with history of GAD, MDD, and previous SI. Had multiple medication changes and discontinuations during prior admissions. She is requesting to speak to the psychiatry team regarding her anxiety and medication management. - Psychiatry recommending continuation of fluoxetine , Remeron , and nightly Seroquel .  Taper  trazodone  off gradually, starting at 50 mg at night for 1 week then discontinued.  Subjective: Patient evaluated at bedside today.  No acute events overnight per patient or nursing staff.  Patient reports continued postprandial nausea with soft regular  diet.  However, she continues to deny any vomiting episodes since her admission.  Also, reports 2 days of black tarry liquidy stools.  Denies any NSAID use.  Prefers GI evaluation prior to discharge.  Physical Exam: Vitals:   12/24/23 2004 12/24/23 2354 12/25/23 0617 12/25/23 0820  BP: (!) 148/69 131/74 138/81 138/77  Pulse: 95 77 80 88  Resp: 20 20  18   Temp: 98.6 F (37 C) 98.7 F (37.1 C)  98.2 F (36.8 C)  TempSrc: Oral Oral  Oral  SpO2: 98% 97% 97% 97%  Weight:      Height:       Physical Exam Constitutional:      General: She is not in acute distress.    Appearance: She is not ill-appearing.  HENT:     Mouth/Throat:     Mouth: Mucous membranes are dry.  Cardiovascular:     Rate and Rhythm: Normal rate and regular rhythm.     Heart sounds: Normal heart sounds. No murmur heard. Pulmonary:     Effort: Pulmonary effort is normal. No respiratory distress.     Breath sounds: Normal breath sounds. No wheezing.  Abdominal:     General: Bowel sounds are normal. There is no distension.     Palpations: Abdomen is soft.     Tenderness: There is abdominal tenderness (Mild left-sided abdominal tenderness). There is no guarding.  Musculoskeletal:     Right lower leg: No edema.     Left lower leg: No edema.  Skin:    General: Skin is warm and dry.     Capillary Refill: Capillary refill takes less than 2 seconds.  Neurological:     Mental Status: She is alert and oriented to person, place, and time. Mental status is at baseline.     Family Communication: Only discussed with the patient  Disposition: Status is: Observation The patient will require care spanning > 2 midnights and should be moved to inpatient because: Postprandial nausea and abdominal pain improving.  Possibly discharge pending GI evaluation. Planned Discharge Destination: Home  DVT prophylaxis: enoxaparin  (LOVENOX ) injection 40 mg Start: 12/23/23 1000   Time spent: 45 minutes    Author: Duffy Larch, MD 12/25/2023 12:31 PM  For on call review [email or web address].

## 2023-12-25 NOTE — Plan of Care (Signed)

## 2023-12-25 NOTE — Plan of Care (Signed)

## 2023-12-25 NOTE — Progress Notes (Signed)
 Transition of Care [facility]) - Inpatient Brief Assessment   Patient Details  Name: Natalie Edwards MRN: [id number] Date of Birth: [birth year]  [facility] CM/SW Contact:    Rosaline JONELLE Joe, RN Phone Number: 12/25/2023, 2:48 PM   Clinical Narrative: Patient admitted to the hospital for intractable nausea, vomiting, cystitis.  No IP Care management needs.  Patient may likely discharge later today when stable per MD.   Transition of Care Asessment: Insurance and Status: (P) Insurance coverage has been reviewed Patient has primary care physician: (P) Yes Home environment has been reviewed: (P) from home Prior level of function:: (P) self Prior/Current Home Services: (P) No current home services Social Drivers of Health Review: (P) SDOH reviewed no interventions necessary Readmission risk has been reviewed: (P) Yes Transition of care needs: (P) no transition of care needs at this time

## 2023-12-25 NOTE — H&P (View-Only) (Signed)
 Consultation  Referring Provider: [facility]/Al-Sultani Primary Care Physician:  Tanda Bleacher, MD Primary Gastroenterologist:  Dr. Charlanne  Reason for Consultation: Persistent nausea, upper abdominal pain, left-sided  Hx; Natalie Edwards is a 58 year old African-American female with history of hypertension, anxiety/depression, previous peptic ulcer disease, recurrent diverticulitis, and is status post cholecystectomy and hysterectomy.  Patient had undergone sigmoid colectomy in mid August 2025 due to recurrent diverticulitis.  She had a psych admission towards the end of August secondary to depression/suicidal ideation. She has had 3 recent ER visits on 10/3, 10/5, and then 12/23/2023 at which time she was admitted.  Each of these visits was with abdominal pain and nausea. CT scan on 12/18/2023 with contrast showed postop changes around the sigmoid colon with trace stranding in the descending colon consistent with mild diverticulitis.  She was started on antibiotics. She returned on 12/20/2023 with persistent symptoms and had repeat CT scan that showed some subtle improvement.  She was continued on Cipro  and Flagyl . On return to the ER on 12/23/2023 with repeat CT with contrast shows a normal-appearing stomach, no evidence of bowel wall thickening distention or inflammatory changes there is descending and sigmoid diverticulosis and an anastomosis in the rectosigmoid no active diverticulitis status post cholecystectomy and hysterectomy.  Labs on 12/23/2023 WBC of 13.6/hemoglobin 12.4/hematocrit 37.6/MCV 78.6 Lipase within normal limits Potassium 2.7/BUN 5/creatinine 1.35/LFTs within normal limits .  Magnesium  1.5 Lactate 2.2 Eric screen ordered but not done UA positive for ketones and protein  Labs today WBC 10.6/hemoglobin 10.1/hematocrit 30.9/MCV 80.5/platelets 401  Sodium 141/potassium 3.7/BUN 5/creatinine 1.03  She has been treated with IV Rocephin , as needed antiemetics and analgesics and is on PPI  as well as Carafate .   She is continuing to complain of nausea though she is eating solid food.  She says she hates to vomit so she has not actually had any emesis.  No heartburn or indigestion, no dysphagia.  She does feel that the diverticulitis type pain has improved.  She says her stools have been loose or runny since she has been here and had been that way for about a week at home having 1-2 bowel movements per day.  She also feels that her stools have been dark to black.  He has not been on any recent aspirin or NSAIDs.  She endorses taking a PPI regularly at home.  Last EGD and colonoscopy January 2025 with finding of multiple diverticuli in the sigmoid colon otherwise unremarkable EGD at same setting with healed gastric ulcer in the antrum ,and prominent gastric  folds in the antrum Biopsies showed nonspecific reactive gastropathy no H. Pylori.   Past Medical History:  Diagnosis Date   Allergy    Anemia    Diverticulosis    Gall bladder stones 1993   Gastric ulcer    GERD (gastroesophageal reflux disease)    Hypertension    Renal mass     Past Surgical History:  Procedure Laterality Date   ABDOMINAL HYSTERECTOMY     BIOPSY  09/29/2022   Procedure: BIOPSY;  Surgeon: Shila Gustav GAILS, MD;  Location: [facility] ENDOSCOPY;  Service: Gastroenterology;;   CHOLECYSTECTOMY     COLONOSCOPY WITH ESOPHAGOGASTRODUODENOSCOPY (EGD)  04/07/2023   [facility]   ESOPHAGOGASTRODUODENOSCOPY (EGD) WITH PROPOFOL  N/A 09/29/2022   Procedure: ESOPHAGOGASTRODUODENOSCOPY (EGD) WITH PROPOFOL ;  Surgeon: Shila Gustav GAILS, MD;  Location: [facility] ENDOSCOPY;  Service: Gastroenterology;  Laterality: N/A;   LAPAROSCOPIC SIGMOID COLECTOMY N/A 10/27/2023   Procedure: COLECTOMY, SIGMOID, LAPAROSCOPIC;  Surgeon: Lyndel Deward PARAS,  MD;  Location: [facility] OR;  Service: General;  Laterality: N/A;   MOBILIZATION, SPLENIC FLEXURE, WITH PARTIAL COLECTOMY N/A 10/27/2023   Procedure: MOBILIZATION, SPLENIC FLEXURE;  Surgeon:  Stechschulte, Deward PARAS, MD;  Location: [facility] OR;  Service: General;  Laterality: N/A;    Prior to Admission medications   Medication Sig Start Date End Date Taking? Authorizing Provider  ciprofloxacin  (CIPRO ) 500 MG tablet Take 1 tablet (500 mg total) by mouth every 12 (twelve) hours. 12/18/23  Yes Vicky Charleston, PA-C  FLUoxetine  (PROZAC ) 40 MG capsule Take 1 capsule (40 mg total) by mouth daily. Patient taking differently: Take 40 mg by mouth 2 (two) times daily. 11/10/23  Yes Jadapalle, Sree, MD  hydrochlorothiazide  (HYDRODIURIL ) 25 MG tablet Take 25 mg by mouth daily. 11/20/23  Yes [provider]  HYDROcodone -acetaminophen  (NORCO/VICODIN) 5-325 MG tablet Take 1 tablet by mouth every 6 (six) hours as needed. Patient taking differently: Take 1 tablet by mouth every 6 (six) hours as needed for moderate pain (pain score 4-6) or severe pain (pain score 7-10). 12/20/23  Yes Aberman, Aleck BROCKS, PA-C  metoCLOPramide  (REGLAN ) 10 MG tablet Take 1 tablet (10 mg total) by mouth every 6 (six) hours. Patient taking differently: Take 10 mg by mouth daily as needed for nausea or vomiting. 12/20/23  Yes Aberman, Caroline C, PA-C  metroNIDAZOLE  (FLAGYL ) 500 MG tablet Take 1 tablet (500 mg total) by mouth 2 (two) times daily. 12/18/23  Yes Vicky Charleston, PA-C  mirtazapine  (REMERON ) 45 MG tablet Take 45 mg by mouth at bedtime. 12/16/23  Yes [provider]  propranolol  (INDERAL ) 10 MG tablet Take 10 mg by mouth daily. 11/10/23  Yes [provider]  traZODone  (DESYREL ) 100 MG tablet Take 100 mg by mouth at bedtime as needed for sleep. 12/10/23  Yes [provider]  nicotine  (NICODERM CQ  - DOSED IN MG/24 HOURS) 14 mg/24hr patch Place 1 patch (14 mg total) onto the skin daily at 6 (six) AM. Patient not taking: Reported on 12/23/2023 11/10/23   Jadapalle, Sree, MD  oxyCODONE -acetaminophen  (PERCOCET/ROXICET) 5-325 MG tablet Take 1-2 tablets by mouth every 6 (six) hours as needed for severe  pain (pain score 7-10). Patient not taking: Reported on 12/23/2023 12/18/23   Vicky Charleston, PA-C  prochlorperazine  (COMPAZINE ) 10 MG tablet Take 10 mg by mouth 2 (two) times daily as needed. Patient not taking: Reported on 12/23/2023 12/19/23   [provider]    Current Facility-Administered Medications  Medication Dose Route Frequency Provider Last Rate Last Admin   0.9 %  sodium chloride  infusion  1,000 mL Intravenous Continuous Cardama, Raynell Moder, MD       acetaminophen  (TYLENOL ) tablet 1,000 mg  1,000 mg Oral TID Georgina Basket, MD   1,000 mg at 12/25/23 9083   cefTRIAXone  (ROCEPHIN ) 1 g in sodium chloride  0.9 % 100 mL IVPB  1 g Intravenous Q24H Moore, Willie, MD 200 mL/hr at 12/25/23 1141 1 g at 12/25/23 1141   enoxaparin  (LOVENOX ) injection 40 mg  40 mg Subcutaneous Daily Esterwood, Amy S, PA-C   40 mg at 12/25/23 9082   FLUoxetine  (PROZAC ) capsule 40 mg  40 mg Oral Daily Georgina Basket, MD   40 mg at 12/25/23 9083   hydrALAZINE  (APRESOLINE ) injection 10 mg  10 mg Intravenous Q6H PRN Al-Sultani, Anmar, MD       mirtazapine  (REMERON ) tablet 45 mg  45 mg Oral QHS Al-Sultani, Anmar, MD       nicotine  (NICODERM CQ  - dosed in mg/24 hours) patch  14 mg  14 mg Transdermal Daily Al-Sultani, Anmar, MD   14 mg at 12/25/23 9081   ondansetron  (ZOFRAN ) injection 4 mg  4 mg Intravenous Q6H PRN Georgina Basket, MD   4 mg at 12/25/23 1514   oxyCODONE  (Oxy IR/ROXICODONE ) immediate release tablet 5 mg  5 mg Oral Q4H PRN Georgina Basket, MD   5 mg at 12/25/23 1518   pantoprazole  (PROTONIX ) EC tablet 40 mg  40 mg Oral BID Al-Sultani, Anmar, MD       QUEtiapine  (SEROQUEL ) tablet 200 mg  200 mg Oral QHS Keturah Carrier, MD   200 mg at 12/24/23 2143   sucralfate  (CARAFATE ) 1 GM/10ML suspension 1 g  1 g Oral TID WC & HS Al-Sultani, Anmar, MD   1 g at 12/25/23 1140   traZODone  (DESYREL ) tablet 50 mg  50 mg Oral QHS PRN Larina Fling, MD        Allergies as of 12/23/2023 - Review Complete 12/23/2023   Allergen Reaction Noted   Hydroxyzine  Palpitations and Other (See Comments) 06/07/2018   Nsaids Other (See Comments) 08/30/2023   Ambien  [zolpidem  tartrate] Other (See Comments) 06/07/2018   Ibuprofen  Other (See Comments) 02/16/2023    Family History  Problem Relation Age of Onset   Cancer Maternal Uncle        Lung   Cancer Maternal Uncle        Lung   Cancer Maternal Uncle        Lung   Headache Neg Hx        I don't think so   Migraines Neg Hx        I don't think so    Social History   Socioeconomic History   Marital status: Single    Spouse name: Not on file   Number of children: 3   Years of education: Not on file   Highest education level: 12th grade  Occupational History   Occupation: environmental services at [employer]  Tobacco Use   Smoking status: Every Day    Current packs/day: 0.50    Average packs/day: 0.5 packs/day for 25.0 years (12.5 ttl pk-yrs)    Types: Cigarettes   Smokeless tobacco: Never   Tobacco comments:    Pt tried to quit - helps with anxiety     1 pack last patient 3 days   Vaping Use   Vaping status: Never Used  Substance and Sexual Activity   Alcohol use: Yes    Comment: occasionally - last drink was July 4, 1 shot   Drug use: No   Sexual activity: Yes    Comment: hysterectomy  Other Topics Concern   Not on file  Social History Narrative   Lives at home in an apartment. Her mother lives with her.    Right handed   Caffeine: drinks approx. 36 oz of pepsi per day. Sometimes drinks 2 cups of coffee in a day as well.    Social Drivers of Corporate investment banker Strain: Low Risk  (03/06/2023)   Overall Financial Resource Strain (CARDIA)    Difficulty of Paying Living Expenses: Not very hard  Food Insecurity: Food Insecurity Present (12/23/2023)   Hunger Vital Sign    Worried About Running Out of Food in the Last Year: Never true    Ran Out of Food in the Last Year: Sometimes true  Transportation Needs: Unmet  Transportation Needs (12/23/2023)   PRAPARE - Administrator, Civil Service (Medical): No  Lack of Transportation (Non-Medical): Yes  Physical Activity: Sufficiently Active (05/22/2022)   Exercise Vital Sign    Days of Exercise per Week: 4 days    Minutes of Exercise per Session: 70 min  Stress: Stress Concern Present (03/06/2023)   [employer] of Occupational Health - Occupational Stress Questionnaire    Feeling of Stress : Very much  Social Connections: Socially Isolated (11/04/2023)   Social Connection and Isolation Panel    Frequency of Communication with Friends and Family: Never    Frequency of Social Gatherings with Friends and Family: Never    Attends Religious Services: Never    Database administrator or Organizations: No    Attends Banker Meetings: Never    Marital Status: Divorced  Catering manager Violence: Unknown (12/23/2023)   Humiliation, Afraid, Rape, and Kick questionnaire    Fear of Current or Ex-Partner: Patient declined    Emotionally Abused: No    Physically Abused: No    Sexually Abused: No    Review of Systems: Pertinent positive and negative review of systems were noted in the above HPI section.  All other review of systems was otherwise negative.   Physical Exam: Vital signs in last 24 hours: Temp:  [98.2 F (36.8 C)-98.7 F (37.1 C)] 98.2 F (36.8 C) (10/10 0820) Pulse Rate:  [77-96] 88 (10/10 0820) Resp:  [18-20] 18 (10/10 0820) BP: (131-152)/(69-89) 138/77 (10/10 0820) SpO2:  [96 %-98 %] 97 % (10/10 0820) Last BM Date : 12/25/23 General:   Alert,  Well-developed, well-nourished, older African-American female pleasant and cooperative in NAD Head:  Normocephalic and atraumatic. Eyes:  Sclera clear, no icterus.   Conjunctiva pink. Ears:  Normal auditory acuity. Nose:  No deformity, discharge,  or lesions. Mouth:  No deformity or lesions.   Neck:  Supple; no masses or thyromegaly. Lungs:  Clear throughout to  auscultation.   No wheezes, crackles, or rhonchi  Heart:  Regular rate and rhythm; no murmurs, clicks, rubs,  or gallops. Abdomen:  Soft, she has some mild tenderness in the left mid quadrant, and tenderness in the epigastrium, no guarding or rebound BS active,nonpalp mass or hsm.   Rectal: Brown Hemoccult negative stool on rectal exam by myself Msk:  Symmetrical without gross deformities. . Pulses:  Normal pulses noted. Extremities:  Without clubbing or edema. Neurologic:  Alert and  oriented x4;  grossly normal neurologically. Skin:  Intact without significant lesions or rashes.. Psych:  Alert and cooperative. Normal mood and affect.  Intake/Output from previous day: No intake/output data recorded. Intake/Output this shift: No intake/output data recorded.  Lab Results: Recent Labs    12/23/23 0403 12/23/23 0433 12/24/23 1221 12/25/23 0539  WBC 13.6*  --  13.8* 10.6*  HGB 12.4 13.9 10.9* 10.1*  HCT 37.5 41.0 32.5* 30.9*  PLT 471*  --  431* 401*   BMET Recent Labs    12/23/23 0403 12/23/23 0433 12/24/23 1221 12/25/23 0539  NA 143 147* 140 141  K 2.7* 2.7* 4.3 3.7  CL 112* 114* 112* 111  CO2 14*  --  18* 19*  GLUCOSE 128* 129* 113* 88  BUN 5* 4* <5* <5*  CREATININE 1.35* 1.20* 1.02* 1.03*  CALCIUM  9.1  --  8.7* 8.6*   LFT Recent Labs    12/23/23 0403  PROT 7.3  ALBUMIN  3.9  AST 21  ALT 19  ALKPHOS 50  BILITOT 0.9   PT/INR No results for input(s): LABPROT, INR in the last 72 hours. Hepatitis  Panel No results for input(s): HEPBSAG, HCVAB, HEPAIGM, HEPBIGM in the last 72 hours.    IMPRESSION:  #84 58 year old African-American female with history of recurrent diverticulitis who had undergone sigmoid resection in mid August 2025 for persistent diverticulitis. She had 3 recent ER visits culminating in an admission on 12/23/2023 with complaints of left-sided abdominal pain and nausea. CT imaging did confirm diverticulitis with the first of those 3 ER  visits and she had been started on Cipro  and Flagyl . Repeat CT on 12/23/2023 on the day of admission did not show any active diverticulitis but due to her complaints she has been continued on Rocephin .  Now with primary complaint of nausea without vomiting, persistent abdominal pain with some migration to the epigastrium, admits that the diverticulitis pain has had some improvement.  Also with complaints of black stool and loose stool since admission and over several days prior to admission.  She has been on PPI and Carafate  as well as antiemetics without improvement in her symptoms over the past couple of days.  Noted to have a drift in her hemoglobin but this was in the setting of volume depletion on admission.  There was concern for potential GI blood loss.  Stool is Hemoccult negative on my exam today  Etiology of her persistent nausea and epigastric pain is not clear, some of this may be antibiotic related, consider recurrent peptic ulcer disease, acute gastritis, functional dyspepsia  #2 history of significant anxiety/depression and recent psychiatric admission with suicidal ideation and depression.  Patient reports better on current meds  #3 anemia-mild normocytic #4 status post cholecystectomy and hysterectomy #5 hypertension #6 acute kidney injury improved #7 hypokalemia  PLAN: N.p.o. past midnight Patient will be scheduled for EGD with Dr. Leigh for tomorrow 12/26/2023.  Procedure was discussed in detail with the patient including indications risk and benefits and she is agreeable to proceed. Continue twice daily PPI Antiemetics every 6 hours as needed for nausea Start trial of bentyl  10 mg ac for Left abdominal pain Stop Rocephin  as no active diverticulitis on CT on admission Assured patient that she is not having any evidence of GI bleeding. GI will follow along, further recommendations pending findings at EGD   Amy EsterwoodPA-C  12/25/2023, 3:32 PM   ATTENDING  ADDENDUM 58 year old female with history of recurrent diverticulitis status post left-sided resection, history of PUD, status post cholecystectomy, admitted to the hospital with recurrent nausea and abdominal pain.  She had sigmoid colectomy in August for recurrent diverticulitis.  She states she has had persistent pain in her left lower quadrant since that time.  She had 3 ED visits in the past week for pain and nausea.  Pain localized to the left lower quadrant.  CT scan on October 3 suggested mild diverticulitis of the left colon, CT scan on October 5 showed interval improvement, CT scan October 8 did not show any diverticulitis.  She states antibiotics really did not help her pain in that area and it persists in that area all the time, no improvement.  What is new and worse for her is nausea.  She feels like she needs to vomit frequently but does not. Has poor apppetite.  She has some epigastric discomfort but mostly in the left lower quadrant.  She is worried she has had recurrent ulcers.  She has not been on any PPI at home, started on Protonix  40 mg twice daily here, as well as Carafate .  We discussed her course.  She has had persistent pain since  her surgery in the left lower quadrant, I think this could more likely be neuropathic/postoperative.  Unclear if she  truly had recurrent diverticulitis postoperatively.  Will give her a trial of Bentyl  3 times daily to see if that helps any of her pain.  In regards to her nausea, continue Zofran  but will have that scheduled 3 times daily as opposed to as needed.  She does have a history of ulcers, has not been on PPI, offered her an EGD in light of her persistent nausea and discomfort.  We discussed what EGD is, risks benefits, she understands and wants to proceed.  Will tentatively plan for that tomorrow morning.  N.p.o. after midnight.  Further recommendations pending the results.  I personally saw the patient and performed a substantive portion of  this encounter (>50% time spent), including MDM.  Marcey Naval, MD [facility]

## 2023-12-25 NOTE — Consult Note (Signed)
 [facility] Psychiatric Consult Initial  Patient Name: .Natalie Edwards  MRN: [id number]  DOB: [birth year]  Consult Order details:  Orders (From admission, onward)     Start     Ordered   12/24/23 0819  IP CONSULT TO PSYCHIATRY       Ordering Provider: Al-Sultani, Anmar, MD  Provider:  (Not yet assigned)  Question Answer Comment  Location [facility]   Reason for Consult? Patient requesting [facility] consult for anxiety management, was recently in for suicide attempt with multiple medication changes      12/24/23 0822             Mode of Visit: In person    Psychiatry Consult Evaluation  Service Date: December 25, 2023 LOS:  LOS: 0 days  Chief Complaint the patient is a 58 year old female who was admitted for abdominal pain with nausea and vomiting.  She complains of depression and requested to be seen by psychiatry.  Primary Psychiatric Diagnoses  Major depression recurrent moderately severe without psychosis 2.  Adjustment disorder with mixed emotional features 3.  History of cocaine and alcohol use disorder  Assessment  Natalie Edwards is a 58 y.o. female admitted: Medicallyfor 12/23/2023  3:45 AM for nausea and vomiting. She carries the psychiatric diagnoses of major depression recurrent without psychosis and has a past medical history of hypertension, tachycardia diverticulitis acute kidney injury, iron  deficiency anemia GERD and peptic ulcer disease.  Rhabdo myelosis..   Her current presentation of anxiety, depression and feelings of helplessness and worthlessness are consistent with major depression, recurrent with no active suicidal or homicidal ideations or psychosis.  She meets the criteria based on history and current presentation.  Her outpatient medications include fluoxetine  40 mg a day, Remeron  45 mg at night and trazodone .  She has been compliant with medications.  On examination the patient is alert oriented and cooperative and reports that she feels much  better today as compared to yesterday.  She reports that she lives alone and feels very lonely and has no activities and she just wanted to talk to someone.  During her current presentation she denies any active suicidal or homicidal ideations or psychosis and denies abusing drugs prior to admission.  She reports that since her last admission in August she has been doing well on current medication and has a therapist and a psychiatrist.  She just wanted to touch base with a psychiatrist while she was in the hospital and wants to ensure that she gets some additional resources upon discharge. For full  recommendation please see below.  Diagnoses:  Active Hospital problems: Principal Problem:   Intractable nausea and vomiting    Plan   ## Psychiatric Medication Recommendations:  Continue Prozac  40 mg a day Taper off the trazodone  gradually, will start with trazodone  50 mg at night as needed then in 1 week discontinued. Continue Seroquel  200 mg at night, patient was started on this 2 weeks ago and wishes to continue it.  ## Medical Decision Making Capacity: Not specifically addressed in this encounter  ## Further Work-up:  -- None noted EKG -- most recent EKG on 12/23/2023 had QtC of 476 -- Pertinent labwork reviewed earlier this admission includes: As per the hospitalist   ## Disposition:-- Plan Post Discharge/Psychiatric Care Follow-up resources please schedule outpatient follow-up appointment with her therapist immediately upon discharge.  Patient would also benefit from resources for her to get a part-time job and may be a senior center close to home.  ##  Behavioral / Environmental: -Utilize compassion and acknowledge the patient's experiences while setting clear and realistic expectations for care.    ## Safety and Observation Level:  - Based on my clinical evaluation, I estimate the patient to be at low risk of self harm in the current setting. - At this time, we recommend   routine. This decision is based on my review of the chart including patient's history and current presentation, interview of the patient, mental status examination, and consideration of suicide risk including evaluating suicidal ideation, plan, intent, suicidal or self-harm behaviors, risk factors, and protective factors. This judgment is based on our ability to directly address suicide risk, implement suicide prevention strategies, and develop a safety plan while the patient is in the clinical setting. Please contact our team if there is a concern that risk level has changed.  CSSR Risk Category:C-SSRS RISK CATEGORY: No Risk  Suicide Risk Assessment: Patient has following modifiable risk factors for suicide: under treated depression  and recent psychiatric hospitalization, which we are addressing by outpatient follow-up and continuation of medications.. Patient has following non-modifiable or demographic risk factors for suicide: psychiatric hospitalization Patient has the following protective factors against suicide: Access to outpatient mental health care  Thank you for this consult request. Recommendations have been communicated to the primary team.  We we will sign off at this time but will be available as indicated.  PAULETTE BEETS, MD       History of Present Illness  Relevant Aspects of [facility] Course:  Admitted on 12/23/2023 for nausea and vomiting. They were continuing further workup..   Patient Report:   12/24/2023:  The patient is a 58 year old African-American female who is a good historian.  She reports that since her discharge from [facility] in August she has been doing fairly well and taking her medications and has had a outpatient therapist and a psychiatrist.  She denies any side effects on medications.  She reports that her main problem is loneliness.  She lives alone and although she has 3 children they do not live in the state.  She has a lot of time on her hands and  she has been trying to get a part-time job if possible.  She reports that she will do a lot better if she had some activities to do.  When seen today she was alert oriented cooperative and pleasant and mildly tearful but denies any auditory or visual hallucinations or delusions and she denies any SI/HI/AVH.  He is contracting for safety and reports that she just needed someone to talk to.  She reports that she has been compliant with her medications.  12/25/2023: The patient was seen and evaluated and chart was reviewed.  When seen today the patient reports that she feels a whole lot better today physically and emotionally.  She is not as tearful and reports that her depression is improving.  She reports that it is not just the fact that she feels lonely at home but also the fact that she continues to have this persistent physical symptoms and she is worried that the main thing that is solid in her head. Today she denied any active SI/HI/AVH.  She would like to have psychiatry available to talk if needed but she feels in general much more stable.   Psych ROS:  Depression: Recurrent Anxiety: History of recurrent anxiety Mania (lifetime and current): Denies Psychosis: (lifetime and current): Denies  Collateral information:  No collateral available patient lives alone.  Review of Systems  Psychiatric/Behavioral:  Positive for depression. The patient is nervous/anxious.      Psychiatric and Social History  Psychiatric History:  Information collected from patient's records and interview with the patient.   Prev Dx/Sx: Depression and anxiety Current Psych Provider: Unable to recall Home Meds (current): Prozac , Remeron , Lyrica , Risperdal  Previous Med Trials: Allergic to Ambien  Therapy: None reported   Prior Psych Hospitalization: Twice, most recent one in August 2025. 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 Alcohol: 1 bottle of wine per night sometimes Type of alcohol wine Last Drink the week before hospitalization Number of drinks per day as above History of alcohol withdrawal seizures denies History of DT's denies Tobacco: 1 pack/day Illicit drugs: Cocaine intermittently, 1 week before hospitalization, a small bag Prescription drug abuse: Denies Rehab hx: Denies  Exam Findings  Physical Exam: As per the hospitalist Vital Signs:  Temp:  [98.2 F (36.8 C)-98.7 F (37.1 C)] 98.2 F (36.8 C) (10/10 0820) Pulse Rate:  [77-96] 88 (10/10 0820) Resp:  [18-20] 18 (10/10 0820) BP: (131-152)/(69-89) 138/77 (10/10 0820) SpO2:  [96 %-98 %] 97 % (10/10 0820) Blood pressure 138/77, pulse 88, temperature 98.2 F (36.8 C), temperature source Oral, resp. rate 18, height 5' 3 (1.6 m), weight 77.6 kg, SpO2 97%. Body mass index is 30.29 kg/m.  Physical Exam Neurological:     General: No focal deficit present.     Mental Status: She is oriented to person, place, and time.  Psychiatric:        Behavior: Behavior normal.     Mental Status Exam: General Appearance: Disheveled  Orientation:  Full (Time, Place, and Person)  Memory:  Immediate;   Fair Recent;   Fair Remote;   Fair  Concentration:  Concentration: Fair and Attention Span: Fair  Recall:  Fair  Attention  Good  Eye Contact:  Good  Speech:  Normal Rate  Language:  Good  Volume:  Decreased  Mood: Depressive  Affect:  Constricted  Thought Process:  Coherent  Thought Content:  Logical  Suicidal Thoughts:  No  Homicidal Thoughts:  No   Judgement:  Intact  Insight:  Fair  Psychomotor Activity:  Decreased  Akathisia:  No  Fund of Knowledge:  Fair      Assets:  Manufacturing systems engineer Housing Leisure Time Resilience  Cognition:  WNL  ADL's:  Intact  AIMS (if indicated):        Other History   These have been pulled in through the EMR, reviewed, and updated if appropriate.  Family History:  The patient's family history includes Cancer in her maternal uncle, maternal uncle, and maternal uncle.  Medical History: Past Medical History:  Diagnosis Date   Allergy    Anemia    Diverticulosis    Gall bladder stones 1993   Gastric ulcer    GERD (gastroesophageal reflux disease)    Hypertension    Renal mass     Surgical History: Past Surgical History:  Procedure Laterality Date   ABDOMINAL HYSTERECTOMY     BIOPSY  09/29/2022   Procedure: BIOPSY;  Surgeon: Nandigam, Kavitha V, MD;  Location: [facility] ENDOSCOPY;  Service: Gastroenterology;;   CHOLECYSTECTOMY     COLONOSCOPY WITH ESOPHAGOGASTRODUODENOSCOPY (EGD)  04/07/2023   [facility]   ESOPHAGOGASTRODUODENOSCOPY (EGD) WITH PROPOFOL  N/A 09/29/2022   Procedure: ESOPHAGOGASTRODUODENOSCOPY (EGD) WITH PROPOFOL ;  Surgeon: Shila Gustav GAILS, MD;  Location: [facility] ENDOSCOPY;  Service: Gastroenterology;  Laterality: N/A;   LAPAROSCOPIC SIGMOID COLECTOMY N/A 10/27/2023   Procedure: COLECTOMY, SIGMOID, LAPAROSCOPIC;  Surgeon: Lyndel Deward PARAS, MD;  Location: [facility] OR;  Service: General;  Laterality: N/A;   MOBILIZATION, SPLENIC FLEXURE, WITH PARTIAL COLECTOMY N/A 10/27/2023   Procedure: MOBILIZATION, SPLENIC FLEXURE;  Surgeon: Lyndel Deward PARAS, MD;  Location: [facility] OR;  Service: General;  Laterality: N/A;     Medications:   Current Facility-Administered Medications:    [COMPLETED] sodium chloride  0.9 % bolus 1,000 mL, 1,000 mL, Intravenous, Once, Stopped at 12/23/23 0834 **FOLLOWED BY** 0.9 %  sodium chloride  infusion, 1,000 mL, Intravenous, Continuous, Cardama, Raynell Moder, MD   acetaminophen  (TYLENOL ) tablet 1,000 mg, 1,000 mg, Oral, TID, Georgina Basket, MD, 1,000 mg at 12/25/23 9083   cefTRIAXone  (ROCEPHIN ) 1 g in sodium chloride  0.9 % 100 mL IVPB, 1 g, Intravenous, Q24H, Georgina Basket, MD, Last Rate: 200 mL/hr at 12/25/23 1141, 1 g at 12/25/23 1141   enoxaparin  (LOVENOX ) injection 40 mg, 40 mg, Subcutaneous, Daily, Georgina Basket, MD, 40 mg at 12/25/23 9082   FLUoxetine  (PROZAC ) capsule 40 mg, 40 mg, Oral, Daily, Georgina Basket, MD, 40 mg at 12/25/23 9083   hydrALAZINE  (APRESOLINE ) injection 10 mg, 10 mg, Intravenous, Q6H PRN, Al-Sultani, Anmar, MD   mirtazapine  (REMERON ) tablet 45 mg, 45 mg, Oral, QHS, Al-Sultani, Anmar, MD   nicotine  (NICODERM CQ  - dosed in mg/24 hours) patch 14 mg, 14 mg, Transdermal, Daily, Al-Sultani, Anmar, MD, 14 mg at 12/25/23 9081   ondansetron  (ZOFRAN ) injection 4 mg, 4 mg, Intravenous, Q6H PRN, Georgina Basket, MD, 4 mg at 12/25/23 0913   oxyCODONE  (Oxy IR/ROXICODONE ) immediate release tablet 5 mg, 5 mg, Oral, Q4H PRN, Georgina Basket, MD, 5 mg at 12/24/23 2303   pantoprazole  (PROTONIX ) EC tablet 20 mg, 20 mg, Oral, Daily, Al-Sultani, Anmar, MD, 20 mg at 12/25/23 9083   QUEtiapine  (SEROQUEL ) tablet 200 mg, 200 mg, Oral, QHS, Segars, Dorn, MD, 200 mg at 12/24/23 2143   sucralfate  (CARAFATE ) 1 GM/10ML suspension 1 g, 1 g, Oral, TID WC & HS, Al-Sultani, Anmar, MD, 1 g at 12/25/23 1140   traZODone  (DESYREL ) tablet 50 mg, 50 mg, Oral, QHS PRN, Kris No, MD  Allergies: Allergies  Allergen Reactions   Hydroxyzine  Palpitations and Other (See Comments)    Jitteriness, too   Nsaids Other (See Comments)    History of ulcers   Ambien  [Zolpidem  Tartrate] Other (See Comments)    Jitteriness, nervousness, abdominal pain   Ibuprofen  Other (See Comments)    History of ulcers    PAULETTE BEETS, MD

## 2023-12-25 NOTE — Care Management Obs Status (Signed)
 MEDICARE OBSERVATION STATUS NOTIFICATION   Patient Details  Name: Natalie Edwards MRN: [id number] Date of Birth: 07/11/1965   Medicare Observation Status Notification Given:  Yes  Verbally reviewed notice with Vicci Plan telephonically at [phone] on 10/9 at 2:58 Copy delivered to the patients room   Raschelle Wisenbaker 12/25/2023, 8:21 AM

## 2023-12-25 NOTE — Consult Note (Addendum)
 Consultation  Referring Provider: TRH/Al-Sultani Primary Care Physician:  Tanda Bleacher, MD Primary Gastroenterologist:  Dr. Charlanne  Reason for Consultation: Persistent nausea, upper abdominal pain, left-sided  Hx; Natalie Edwards is a 58 year old African-American female with history of hypertension, anxiety/depression, previous peptic ulcer disease, recurrent diverticulitis, and is status post cholecystectomy and hysterectomy.  Patient had undergone sigmoid colectomy in mid August 2025 due to recurrent diverticulitis.  She had a psych admission towards the end of August secondary to depression/suicidal ideation. She has had 3 recent ER visits on 10/3, 10/5, and then 12/23/2023 at which time she was admitted.  Each of these visits was with abdominal pain and nausea. CT scan on 12/18/2023 with contrast showed postop changes around the sigmoid colon with trace stranding in the descending colon consistent with mild diverticulitis.  She was started on antibiotics. She returned on 12/20/2023 with persistent symptoms and had repeat CT scan that showed some subtle improvement.  She was continued on Cipro  and Flagyl . On return to the ER on 12/23/2023 with repeat CT with contrast shows a normal-appearing stomach, no evidence of bowel wall thickening distention or inflammatory changes there is descending and sigmoid diverticulosis and an anastomosis in the rectosigmoid no active diverticulitis status post cholecystectomy and hysterectomy.  Labs on 12/23/2023 WBC of 13.6/hemoglobin 12.4/hematocrit 37.6/MCV 78.6 Lipase within normal limits Potassium 2.7/BUN 5/creatinine 1.35/LFTs within normal limits .  Magnesium  1.5 Lactate 2.2 Eric screen ordered but not done UA positive for ketones and protein  Labs today WBC 10.6/hemoglobin 10.1/hematocrit 30.9/MCV 80.5/platelets 401  Sodium 141/potassium 3.7/BUN 5/creatinine 1.03  She has been treated with IV Rocephin , as needed antiemetics and analgesics and is on PPI  as well as Carafate .   She is continuing to complain of nausea though she is eating solid food.  She says she hates to vomit so she has not actually had any emesis.  No heartburn or indigestion, no dysphagia.  She does feel that the diverticulitis type pain has improved.  She says her stools have been loose or runny since she has been here and had been that way for about a week at home having 1-2 bowel movements per day.  She also feels that her stools have been dark to black.  He has not been on any recent aspirin or NSAIDs.  She endorses taking a PPI regularly at home.  Last EGD and colonoscopy January 2025 with finding of multiple diverticuli in the sigmoid colon otherwise unremarkable EGD at same setting with healed gastric ulcer in the antrum ,and prominent gastric  folds in the antrum Biopsies showed nonspecific reactive gastropathy no H. Pylori.   Past Medical History:  Diagnosis Date   Allergy    Anemia    Diverticulosis    Gall bladder stones 1993   Gastric ulcer    GERD (gastroesophageal reflux disease)    Hypertension    Renal mass     Past Surgical History:  Procedure Laterality Date   ABDOMINAL HYSTERECTOMY     BIOPSY  09/29/2022   Procedure: BIOPSY;  Surgeon: Shila Gustav GAILS, MD;  Location: [facility] ENDOSCOPY;  Service: Gastroenterology;;   CHOLECYSTECTOMY     COLONOSCOPY WITH ESOPHAGOGASTRODUODENOSCOPY (EGD)  04/07/2023   [facility]   ESOPHAGOGASTRODUODENOSCOPY (EGD) WITH PROPOFOL  N/A 09/29/2022   Procedure: ESOPHAGOGASTRODUODENOSCOPY (EGD) WITH PROPOFOL ;  Surgeon: Shila Gustav GAILS, MD;  Location: [facility] ENDOSCOPY;  Service: Gastroenterology;  Laterality: N/A;   LAPAROSCOPIC SIGMOID COLECTOMY N/A 10/27/2023   Procedure: COLECTOMY, SIGMOID, LAPAROSCOPIC;  Surgeon: Lyndel Deward PARAS,  MD;  Location: [facility] OR;  Service: General;  Laterality: N/A;   MOBILIZATION, SPLENIC FLEXURE, WITH PARTIAL COLECTOMY N/A 10/27/2023   Procedure: MOBILIZATION, SPLENIC FLEXURE;  Surgeon:  Stechschulte, Deward PARAS, MD;  Location: [facility] OR;  Service: General;  Laterality: N/A;    Prior to Admission medications   Medication Sig Start Date End Date Taking? Authorizing Provider  ciprofloxacin  (CIPRO ) 500 MG tablet Take 1 tablet (500 mg total) by mouth every 12 (twelve) hours. 12/18/23  Yes Vicky Charleston, PA-C  FLUoxetine  (PROZAC ) 40 MG capsule Take 1 capsule (40 mg total) by mouth daily. Patient taking differently: Take 40 mg by mouth 2 (two) times daily. 11/10/23  Yes Jadapalle, Sree, MD  hydrochlorothiazide  (HYDRODIURIL ) 25 MG tablet Take 25 mg by mouth daily. 11/20/23  Yes [provider]  HYDROcodone -acetaminophen  (NORCO/VICODIN) 5-325 MG tablet Take 1 tablet by mouth every 6 (six) hours as needed. Patient taking differently: Take 1 tablet by mouth every 6 (six) hours as needed for moderate pain (pain score 4-6) or severe pain (pain score 7-10). 12/20/23  Yes Aberman, Aleck BROCKS, PA-C  metoCLOPramide  (REGLAN ) 10 MG tablet Take 1 tablet (10 mg total) by mouth every 6 (six) hours. Patient taking differently: Take 10 mg by mouth daily as needed for nausea or vomiting. 12/20/23  Yes Aberman, Caroline C, PA-C  metroNIDAZOLE  (FLAGYL ) 500 MG tablet Take 1 tablet (500 mg total) by mouth 2 (two) times daily. 12/18/23  Yes Vicky Charleston, PA-C  mirtazapine  (REMERON ) 45 MG tablet Take 45 mg by mouth at bedtime. 12/16/23  Yes [provider]  propranolol  (INDERAL ) 10 MG tablet Take 10 mg by mouth daily. 11/10/23  Yes [provider]  traZODone  (DESYREL ) 100 MG tablet Take 100 mg by mouth at bedtime as needed for sleep. 12/10/23  Yes [provider]  nicotine  (NICODERM CQ  - DOSED IN MG/24 HOURS) 14 mg/24hr patch Place 1 patch (14 mg total) onto the skin daily at 6 (six) AM. Patient not taking: Reported on 12/23/2023 11/10/23   Jadapalle, Sree, MD  oxyCODONE -acetaminophen  (PERCOCET/ROXICET) 5-325 MG tablet Take 1-2 tablets by mouth every 6 (six) hours as needed for severe  pain (pain score 7-10). Patient not taking: Reported on 12/23/2023 12/18/23   Vicky Charleston, PA-C  prochlorperazine  (COMPAZINE ) 10 MG tablet Take 10 mg by mouth 2 (two) times daily as needed. Patient not taking: Reported on 12/23/2023 12/19/23   [provider]    Current Facility-Administered Medications  Medication Dose Route Frequency Provider Last Rate Last Admin   0.9 %  sodium chloride  infusion  1,000 mL Intravenous Continuous Cardama, Raynell Moder, MD       acetaminophen  (TYLENOL ) tablet 1,000 mg  1,000 mg Oral TID Georgina Basket, MD   1,000 mg at 12/25/23 9083   cefTRIAXone  (ROCEPHIN ) 1 g in sodium chloride  0.9 % 100 mL IVPB  1 g Intravenous Q24H Moore, Willie, MD 200 mL/hr at 12/25/23 1141 1 g at 12/25/23 1141   enoxaparin  (LOVENOX ) injection 40 mg  40 mg Subcutaneous Daily Esterwood, Amy S, PA-C   40 mg at 12/25/23 9082   FLUoxetine  (PROZAC ) capsule 40 mg  40 mg Oral Daily Georgina Basket, MD   40 mg at 12/25/23 9083   hydrALAZINE  (APRESOLINE ) injection 10 mg  10 mg Intravenous Q6H PRN Al-Sultani, Anmar, MD       mirtazapine  (REMERON ) tablet 45 mg  45 mg Oral QHS Al-Sultani, Anmar, MD       nicotine  (NICODERM CQ  - dosed in mg/24 hours) patch  14 mg  14 mg Transdermal Daily Al-Sultani, Anmar, MD   14 mg at 12/25/23 9081   ondansetron  (ZOFRAN ) injection 4 mg  4 mg Intravenous Q6H PRN Georgina Basket, MD   4 mg at 12/25/23 1514   oxyCODONE  (Oxy IR/ROXICODONE ) immediate release tablet 5 mg  5 mg Oral Q4H PRN Georgina Basket, MD   5 mg at 12/25/23 1518   pantoprazole  (PROTONIX ) EC tablet 40 mg  40 mg Oral BID Al-Sultani, Anmar, MD       QUEtiapine  (SEROQUEL ) tablet 200 mg  200 mg Oral QHS Keturah Carrier, MD   200 mg at 12/24/23 2143   sucralfate  (CARAFATE ) 1 GM/10ML suspension 1 g  1 g Oral TID WC & HS Al-Sultani, Anmar, MD   1 g at 12/25/23 1140   traZODone  (DESYREL ) tablet 50 mg  50 mg Oral QHS PRN Larina Fling, MD        Allergies as of 12/23/2023 - Review Complete 12/23/2023   Allergen Reaction Noted   Hydroxyzine  Palpitations and Other (See Comments) 06/07/2018   Nsaids Other (See Comments) 08/30/2023   Ambien  [zolpidem  tartrate] Other (See Comments) 06/07/2018   Ibuprofen  Other (See Comments) 02/16/2023    Family History  Problem Relation Age of Onset   Cancer Maternal Uncle        Lung   Cancer Maternal Uncle        Lung   Cancer Maternal Uncle        Lung   Headache Neg Hx        I don't think so   Migraines Neg Hx        I don't think so    Social History   Socioeconomic History   Marital status: Single    Spouse name: Not on file   Number of children: 3   Years of education: Not on file   Highest education level: 12th grade  Occupational History   Occupation: environmental services at [employer]  Tobacco Use   Smoking status: Every Day    Current packs/day: 0.50    Average packs/day: 0.5 packs/day for 25.0 years (12.5 ttl pk-yrs)    Types: Cigarettes   Smokeless tobacco: Never   Tobacco comments:    Pt tried to quit - helps with anxiety     1 pack last patient 3 days   Vaping Use   Vaping status: Never Used  Substance and Sexual Activity   Alcohol use: Yes    Comment: occasionally - last drink was July 4, 1 shot   Drug use: No   Sexual activity: Yes    Comment: hysterectomy  Other Topics Concern   Not on file  Social History Narrative   Lives at home in an apartment. Her mother lives with her.    Right handed   Caffeine: drinks approx. 36 oz of pepsi per day. Sometimes drinks 2 cups of coffee in a day as well.    Social Drivers of Corporate investment banker Strain: Low Risk  (03/06/2023)   Overall Financial Resource Strain (CARDIA)    Difficulty of Paying Living Expenses: Not very hard  Food Insecurity: Food Insecurity Present (12/23/2023)   Hunger Vital Sign    Worried About Running Out of Food in the Last Year: Never true    Ran Out of Food in the Last Year: Sometimes true  Transportation Needs: Unmet  Transportation Needs (12/23/2023)   PRAPARE - Administrator, Civil Service (Medical): No  Lack of Transportation (Non-Medical): Yes  Physical Activity: Sufficiently Active (05/22/2022)   Exercise Vital Sign    Days of Exercise per Week: 4 days    Minutes of Exercise per Session: 70 min  Stress: Stress Concern Present (03/06/2023)   [employer] of Occupational Health - Occupational Stress Questionnaire    Feeling of Stress : Very much  Social Connections: Socially Isolated (11/04/2023)   Social Connection and Isolation Panel    Frequency of Communication with Friends and Family: Never    Frequency of Social Gatherings with Friends and Family: Never    Attends Religious Services: Never    Database administrator or Organizations: No    Attends Banker Meetings: Never    Marital Status: Divorced  Catering manager Violence: Unknown (12/23/2023)   Humiliation, Afraid, Rape, and Kick questionnaire    Fear of Current or Ex-Partner: Patient declined    Emotionally Abused: No    Physically Abused: No    Sexually Abused: No    Review of Systems: Pertinent positive and negative review of systems were noted in the above HPI section.  All other review of systems was otherwise negative.   Physical Exam: Vital signs in last 24 hours: Temp:  [98.2 F (36.8 C)-98.7 F (37.1 C)] 98.2 F (36.8 C) (10/10 0820) Pulse Rate:  [77-96] 88 (10/10 0820) Resp:  [18-20] 18 (10/10 0820) BP: (131-152)/(69-89) 138/77 (10/10 0820) SpO2:  [96 %-98 %] 97 % (10/10 0820) Last BM Date : 12/25/23 General:   Alert,  Well-developed, well-nourished, older African-American female pleasant and cooperative in NAD Head:  Normocephalic and atraumatic. Eyes:  Sclera clear, no icterus.   Conjunctiva pink. Ears:  Normal auditory acuity. Nose:  No deformity, discharge,  or lesions. Mouth:  No deformity or lesions.   Neck:  Supple; no masses or thyromegaly. Lungs:  Clear throughout to  auscultation.   No wheezes, crackles, or rhonchi  Heart:  Regular rate and rhythm; no murmurs, clicks, rubs,  or gallops. Abdomen:  Soft, she has some mild tenderness in the left mid quadrant, and tenderness in the epigastrium, no guarding or rebound BS active,nonpalp mass or hsm.   Rectal: Brown Hemoccult negative stool on rectal exam by myself Msk:  Symmetrical without gross deformities. . Pulses:  Normal pulses noted. Extremities:  Without clubbing or edema. Neurologic:  Alert and  oriented x4;  grossly normal neurologically. Skin:  Intact without significant lesions or rashes.. Psych:  Alert and cooperative. Normal mood and affect.  Intake/Output from previous day: No intake/output data recorded. Intake/Output this shift: No intake/output data recorded.  Lab Results: Recent Labs    12/23/23 0403 12/23/23 0433 12/24/23 1221 12/25/23 0539  WBC 13.6*  --  13.8* 10.6*  HGB 12.4 13.9 10.9* 10.1*  HCT 37.5 41.0 32.5* 30.9*  PLT 471*  --  431* 401*   BMET Recent Labs    12/23/23 0403 12/23/23 0433 12/24/23 1221 12/25/23 0539  NA 143 147* 140 141  K 2.7* 2.7* 4.3 3.7  CL 112* 114* 112* 111  CO2 14*  --  18* 19*  GLUCOSE 128* 129* 113* 88  BUN 5* 4* <5* <5*  CREATININE 1.35* 1.20* 1.02* 1.03*  CALCIUM  9.1  --  8.7* 8.6*   LFT Recent Labs    12/23/23 0403  PROT 7.3  ALBUMIN  3.9  AST 21  ALT 19  ALKPHOS 50  BILITOT 0.9   PT/INR No results for input(s): LABPROT, INR in the last 72 hours. Hepatitis  Panel No results for input(s): HEPBSAG, HCVAB, HEPAIGM, HEPBIGM in the last 72 hours.    IMPRESSION:  #84 58 year old African-American female with history of recurrent diverticulitis who had undergone sigmoid resection in mid August 2025 for persistent diverticulitis. She had 3 recent ER visits culminating in an admission on 12/23/2023 with complaints of left-sided abdominal pain and nausea. CT imaging did confirm diverticulitis with the first of those 3 ER  visits and she had been started on Cipro  and Flagyl . Repeat CT on 12/23/2023 on the day of admission did not show any active diverticulitis but due to her complaints she has been continued on Rocephin .  Now with primary complaint of nausea without vomiting, persistent abdominal pain with some migration to the epigastrium, admits that the diverticulitis pain has had some improvement.  Also with complaints of black stool and loose stool since admission and over several days prior to admission.  She has been on PPI and Carafate  as well as antiemetics without improvement in her symptoms over the past couple of days.  Noted to have a drift in her hemoglobin but this was in the setting of volume depletion on admission.  There was concern for potential GI blood loss.  Stool is Hemoccult negative on my exam today  Etiology of her persistent nausea and epigastric pain is not clear, some of this may be antibiotic related, consider recurrent peptic ulcer disease, acute gastritis, functional dyspepsia  #2 history of significant anxiety/depression and recent psychiatric admission with suicidal ideation and depression.  Patient reports better on current meds  #3 anemia-mild normocytic #4 status post cholecystectomy and hysterectomy #5 hypertension #6 acute kidney injury improved #7 hypokalemia  PLAN: N.p.o. past midnight Patient will be scheduled for EGD with Dr. Leigh for tomorrow 12/26/2023.  Procedure was discussed in detail with the patient including indications risk and benefits and she is agreeable to proceed. Continue twice daily PPI Antiemetics every 6 hours as needed for nausea Start trial of bentyl  10 mg ac for Left abdominal pain Stop Rocephin  as no active diverticulitis on CT on admission Assured patient that she is not having any evidence of GI bleeding. GI will follow along, further recommendations pending findings at EGD   Amy EsterwoodPA-C  12/25/2023, 3:32 PM   ATTENDING  ADDENDUM 58 year old female with history of recurrent diverticulitis status post left-sided resection, history of PUD, status post cholecystectomy, admitted to the hospital with recurrent nausea and abdominal pain.  She had sigmoid colectomy in August for recurrent diverticulitis.  She states she has had persistent pain in her left lower quadrant since that time.  She had 3 ED visits in the past week for pain and nausea.  Pain localized to the left lower quadrant.  CT scan on October 3 suggested mild diverticulitis of the left colon, CT scan on October 5 showed interval improvement, CT scan October 8 did not show any diverticulitis.  She states antibiotics really did not help her pain in that area and it persists in that area all the time, no improvement.  What is new and worse for her is nausea.  She feels like she needs to vomit frequently but does not. Has poor apppetite.  She has some epigastric discomfort but mostly in the left lower quadrant.  She is worried she has had recurrent ulcers.  She has not been on any PPI at home, started on Protonix  40 mg twice daily here, as well as Carafate .  We discussed her course.  She has had persistent pain since  her surgery in the left lower quadrant, I think this could more likely be neuropathic/postoperative.  Unclear if she  truly had recurrent diverticulitis postoperatively.  Will give her a trial of Bentyl  3 times daily to see if that helps any of her pain.  In regards to her nausea, continue Zofran  but will have that scheduled 3 times daily as opposed to as needed.  She does have a history of ulcers, has not been on PPI, offered her an EGD in light of her persistent nausea and discomfort.  We discussed what EGD is, risks benefits, she understands and wants to proceed.  Will tentatively plan for that tomorrow morning.  N.p.o. after midnight.  Further recommendations pending the results.  I personally saw the patient and performed a substantive portion of  this encounter (>50% time spent), including MDM.  Marcey Naval, MD [facility]

## 2023-12-26 ENCOUNTER — Observation Stay (HOSPITAL_COMMUNITY): Admitting: Anesthesiology

## 2023-12-26 ENCOUNTER — Encounter (HOSPITAL_COMMUNITY): Admission: EM | Disposition: A | Discharge status: Home / Self Care | Payer: Self-pay | Source: Home / Self Care

## 2023-12-26 ENCOUNTER — Encounter (HOSPITAL_COMMUNITY): Payer: Self-pay | Admitting: Hospitalist

## 2023-12-26 DIAGNOSIS — Z888 Allergy status to other drugs, medicaments and biological substances status: Secondary | ICD-10-CM | POA: Diagnosis not present

## 2023-12-26 DIAGNOSIS — Z9151 Personal history of suicidal behavior: Secondary | ICD-10-CM | POA: Diagnosis not present

## 2023-12-26 DIAGNOSIS — K449 Diaphragmatic hernia without obstruction or gangrene: Secondary | ICD-10-CM | POA: Diagnosis present

## 2023-12-26 DIAGNOSIS — Z79899 Other long term (current) drug therapy: Secondary | ICD-10-CM | POA: Diagnosis not present

## 2023-12-26 DIAGNOSIS — K219 Gastro-esophageal reflux disease without esophagitis: Secondary | ICD-10-CM | POA: Diagnosis present

## 2023-12-26 DIAGNOSIS — N3000 Acute cystitis without hematuria: Secondary | ICD-10-CM | POA: Diagnosis present

## 2023-12-26 DIAGNOSIS — K317 Polyp of stomach and duodenum: Secondary | ICD-10-CM | POA: Diagnosis present

## 2023-12-26 DIAGNOSIS — R109 Unspecified abdominal pain: Secondary | ICD-10-CM | POA: Diagnosis not present

## 2023-12-26 DIAGNOSIS — Z886 Allergy status to analgesic agent status: Secondary | ICD-10-CM | POA: Diagnosis not present

## 2023-12-26 DIAGNOSIS — F339 Major depressive disorder, recurrent, unspecified: Secondary | ICD-10-CM | POA: Diagnosis present

## 2023-12-26 DIAGNOSIS — R112 Nausea with vomiting, unspecified: Secondary | ICD-10-CM | POA: Diagnosis present

## 2023-12-26 DIAGNOSIS — Z8711 Personal history of peptic ulcer disease: Secondary | ICD-10-CM | POA: Diagnosis not present

## 2023-12-26 DIAGNOSIS — E876 Hypokalemia: Secondary | ICD-10-CM | POA: Diagnosis present

## 2023-12-26 DIAGNOSIS — K3189 Other diseases of stomach and duodenum: Secondary | ICD-10-CM | POA: Diagnosis not present

## 2023-12-26 DIAGNOSIS — F411 Generalized anxiety disorder: Secondary | ICD-10-CM | POA: Diagnosis present

## 2023-12-26 DIAGNOSIS — N189 Chronic kidney disease, unspecified: Secondary | ICD-10-CM

## 2023-12-26 DIAGNOSIS — E860 Dehydration: Secondary | ICD-10-CM | POA: Diagnosis present

## 2023-12-26 DIAGNOSIS — F1490 Cocaine use, unspecified, uncomplicated: Secondary | ICD-10-CM | POA: Diagnosis present

## 2023-12-26 DIAGNOSIS — F17210 Nicotine dependence, cigarettes, uncomplicated: Secondary | ICD-10-CM | POA: Diagnosis present

## 2023-12-26 DIAGNOSIS — I129 Hypertensive chronic kidney disease with stage 1 through stage 4 chronic kidney disease, or unspecified chronic kidney disease: Secondary | ICD-10-CM

## 2023-12-26 DIAGNOSIS — N179 Acute kidney failure, unspecified: Secondary | ICD-10-CM | POA: Diagnosis present

## 2023-12-26 DIAGNOSIS — E872 Acidosis, unspecified: Secondary | ICD-10-CM | POA: Diagnosis present

## 2023-12-26 DIAGNOSIS — Z9049 Acquired absence of other specified parts of digestive tract: Secondary | ICD-10-CM | POA: Diagnosis not present

## 2023-12-26 DIAGNOSIS — F1010 Alcohol abuse, uncomplicated: Secondary | ICD-10-CM | POA: Diagnosis present

## 2023-12-26 DIAGNOSIS — I10 Essential (primary) hypertension: Secondary | ICD-10-CM | POA: Diagnosis present

## 2023-12-26 DIAGNOSIS — Z5900 Homelessness unspecified: Secondary | ICD-10-CM | POA: Diagnosis not present

## 2023-12-26 DIAGNOSIS — R7303 Prediabetes: Secondary | ICD-10-CM | POA: Diagnosis present

## 2023-12-26 HISTORY — PX: ESOPHAGOGASTRODUODENOSCOPY: SHX5428

## 2023-12-26 HISTORY — PX: BONE BIOPSY: SHX375

## 2023-12-26 LAB — BASIC METABOLIC PANEL WITH GFR
Anion gap: 12 (ref 5–15)
BUN: 11 mg/dL (ref 6–20)
CO2: 19 mmol/L — ABNORMAL LOW (ref 22–32)
Calcium: 8.6 mg/dL — ABNORMAL LOW (ref 8.9–10.3)
Chloride: 109 mmol/L (ref 98–111)
Creatinine, Ser: 1.17 mg/dL — ABNORMAL HIGH (ref 0.44–1.00)
GFR, Estimated: 54 mL/min — ABNORMAL LOW (ref >60)
Glucose, Bld: 94 mg/dL (ref 70–99)
Potassium: 3.8 mmol/L (ref 3.5–5.1)
Sodium: 140 mmol/L (ref 135–145)

## 2023-12-26 LAB — CBC
HCT: 31.2 % — ABNORMAL LOW (ref 36.0–46.0)
Hemoglobin: 10.1 g/dL — ABNORMAL LOW (ref 12.0–15.0)
MCH: 26.6 pg (ref 26.0–34.0)
MCHC: 32.4 g/dL (ref 30.0–36.0)
MCV: 82.3 fL (ref 80.0–100.0)
Platelets: 385 K/uL (ref 150–400)
RBC: 3.79 MIL/uL — ABNORMAL LOW (ref 3.87–5.11)
RDW: 24.1 % — ABNORMAL HIGH (ref 11.5–15.5)
WBC: 8.4 K/uL (ref 4.0–10.5)
nRBC: 0 % (ref 0.0–0.2)

## 2023-12-26 SURGERY — EGD (ESOPHAGOGASTRODUODENOSCOPY)
Anesthesia: Monitor Anesthesia Care

## 2023-12-26 MED ORDER — LACTATED RINGERS IV SOLN: INTRAVENOUS | Status: DC | PRN

## 2023-12-26 MED ORDER — DICYCLOMINE HCL 10 MG PO CAPS: 10.0000 mg | ORAL_CAPSULE | Freq: Three times a day (TID) | ORAL | 0 refills | Status: DC

## 2023-12-26 MED ORDER — TRAZODONE HCL 50 MG PO TABS: 50.0000 mg | ORAL_TABLET | Freq: Every evening | ORAL | 0 refills | Status: DC | PRN

## 2023-12-26 MED ORDER — ONDANSETRON 4 MG PO TBDP: 4.0000 mg | ORAL_TABLET | Freq: Three times a day (TID) | ORAL | 0 refills | Status: DC

## 2023-12-26 MED ORDER — PROPOFOL 10 MG/ML IV BOLUS
INTRAVENOUS | Status: DC | PRN
  Administered 2023-12-26: 30 mg via INTRAVENOUS

## 2023-12-26 MED ORDER — LIDOCAINE 2% (20 MG/ML) 5 ML SYRINGE
INTRAMUSCULAR | Status: DC | PRN
  Administered 2023-12-26: 100 mg via INTRAVENOUS

## 2023-12-26 MED ORDER — PANTOPRAZOLE SODIUM 40 MG PO TBEC: 40.0000 mg | DELAYED_RELEASE_TABLET | Freq: Two times a day (BID) | ORAL | 0 refills | Status: AC

## 2023-12-26 MED ORDER — PROPOFOL 500 MG/50ML IV EMUL
INTRAVENOUS | Status: DC | PRN
  Administered 2023-12-26: 130 ug/kg/min via INTRAVENOUS

## 2023-12-26 MED ORDER — SUCRALFATE 1 GM/10ML PO SUSP: 1.0000 g | Freq: Three times a day (TID) | ORAL | 0 refills | Status: AC

## 2023-12-26 NOTE — Transfer of Care (Signed)
 Immediate Anesthesia Transfer of Care Note  Patient: Natalie Edwards  Procedure(s) Performed: EGD (ESOPHAGOGASTRODUODENOSCOPY) BIOPSY, GI  Patient Location: PACU  Anesthesia Type:MAC  Level of Consciousness: awake, alert , and oriented  Airway & Oxygen Therapy: Patient Spontanous Breathing and Patient connected to nasal cannula oxygen  Post-op Assessment: Report given to RN and Post -op Vital signs reviewed and stable  Post vital signs: Reviewed and stable  Last Vitals:  Vitals Value Taken Time  BP 120/104 12/26/23 10:15  Temp 97   Pulse 90 12/26/23 10:18  Resp 23 12/26/23 10:18  SpO2 96 % 12/26/23 10:18  Vitals shown include unfiled device data.  Last Pain:  Vitals:   12/26/23 0734  TempSrc: Temporal  PainSc: 0-No pain         Complications: No notable events documented.

## 2023-12-26 NOTE — Interval H&P Note (Signed)
 History and Physical Interval Note: Here for EGD today. She states she feels slightly better today. EGD to rule out recurrent PUD, further evaluate persistent nausea. Have discussed risks / benefits, she understands and wishes to proceed. Further recommendations pending the results.   12/26/2023 7:26 AM  Natalie Edwards  has presented today for surgery, with the diagnosis of nausea, abdominal pain, hx gastric ulcer.  The various methods of treatment have been discussed with the patient and family. After consideration of risks, benefits and other options for treatment, the patient has consented to  Procedure(s): EGD (ESOPHAGOGASTRODUODENOSCOPY) (N/A) as a surgical intervention.  The patient's history has been reviewed, patient examined, no change in status, stable for surgery.  I have reviewed the patient's chart and labs.  Questions were answered to the patient's satisfaction.     Elspeth P Kaizer Dissinger

## 2023-12-26 NOTE — Discharge Summary (Incomplete)
 Physician Discharge Summary   Patient: Natalie Edwards MRN: [id number] DOB: [birth year]  Admit date:     12/23/2023  Discharge date: 12/26/23  Discharge Physician: Duffy Al-Sultani   PCP: Tanda Bleacher, MD   Recommendations at discharge:  {Tip this will not be part of the note when signed- Example include specific recommendations for outpatient follow-up, pending tests to follow-up on. (Optional):26781}  ***  Discharge Diagnoses: Principal Problem:   Intractable nausea and vomiting Active Problems:   Gastric polyp   History of colon resection   History of peptic ulcer  Resolved Problems:   * No resolved hospital problems. [facility] Course: Patient is a 58 year old female with past medical history significant for diverticulitis, hypertension, IDA secondary to chronic blood loss, PUD with hemorrhage, GERD, GAD, MDD, prediabetes, cocaine and alcohol abuse, history of suicidal ideation, who was recently admitted on 8/20 to 8/26 for unknown drug overdose and required IP psych admission for monitoring as well as recent admission for diverticulitis from 8/7 to 8/15.  Patient presents to the ED on 12/23/2023 with complaints of recurrent intractable nausea and vomiting in the setting of acute cystitis.   Assessment and Plan: No notes have been filed under this hospital service. Service: Hospitalist     {Tip this will not be part of the note when signed Body mass index is 30.29 kg/m. , ,  (Optional):26781}  {(NOTE) Pain control PDMP Statment (Optional):26782} Consultants: Gastroenterology, psychiatry Procedures performed: EGD  Disposition: Home Diet recommendation:  Regular diet DISCHARGE MEDICATION: Allergies as of 12/26/2023       Reactions   Hydroxyzine  Palpitations, Other (See Comments)   Jitteriness, too   Nsaids Other (See Comments)   History of ulcers   Ambien  [zolpidem  Tartrate] Other (See Comments)   Jitteriness, nervousness, abdominal pain   Ibuprofen  Other (See  Comments)   History of ulcers        Medication List     STOP taking these medications    ciprofloxacin  500 MG tablet Commonly known as: CIPRO    metroNIDAZOLE  500 MG tablet Commonly known as: FLAGYL    prochlorperazine  10 MG tablet Commonly known as: COMPAZINE        TAKE these medications    dicyclomine  10 MG capsule Commonly known as: BENTYL  Take 1 capsule (10 mg total) by mouth 3 (three) times daily before meals.   FLUoxetine  40 MG capsule Commonly known as: PROZAC  Take 1 capsule (40 mg total) by mouth daily. What changed: when to take this   hydrochlorothiazide  25 MG tablet Commonly known as: HYDRODIURIL  Take 25 mg by mouth daily.   HYDROcodone -acetaminophen  5-325 MG tablet Commonly known as: NORCO/VICODIN Take 1 tablet by mouth every 6 (six) hours as needed. What changed: reasons to take this   metoCLOPramide  10 MG tablet Commonly known as: REGLAN  Take 1 tablet (10 mg total) by mouth every 6 (six) hours. What changed:  when to take this reasons to take this   mirtazapine  45 MG tablet Commonly known as: REMERON  Take 45 mg by mouth at bedtime.   nicotine  14 mg/24hr patch Commonly known as: NICODERM CQ  - dosed in mg/24 hours Place 1 patch (14 mg total) onto the skin daily at 6 (six) AM.   ondansetron  4 MG disintegrating tablet Commonly known as: ZOFRAN -ODT Take 1 tablet (4 mg total) by mouth every 8 (eight) hours.   oxyCODONE -acetaminophen  5-325 MG tablet Commonly known as: PERCOCET/ROXICET Take 1-2 tablets by mouth every 6 (six) hours as needed for severe pain (pain score 7-10).  pantoprazole  40 MG tablet Commonly known as: PROTONIX  Take 1 tablet (40 mg total) by mouth 2 (two) times daily.   propranolol  10 MG tablet Commonly known as: INDERAL  Take 10 mg by mouth daily.   QUEtiapine  200 MG tablet Commonly known as: SEROQUEL  Take 200 mg by mouth at bedtime.   sucralfate  1 GM/10ML suspension Commonly known as: CARAFATE  Take 10 mLs (1 g  total) by mouth 4 (four) times daily -  with meals and at bedtime.   traZODone  50 MG tablet Commonly known as: DESYREL  Take 1 tablet (50 mg total) by mouth at bedtime as needed for sleep. What changed:  medication strength how much to take        Follow-up Information     Armbruster, Elspeth SQUIBB, MD Follow up in 2 week(s).   Specialty: Gastroenterology Contact information: [address] [place] [address] [place] [address] [phone]         Tanda Bleacher, MD. Schedule an appointment as soon as possible for a visit in 1 week(s).   Specialty: Family Medicine Contact information: [address] [place] [address] [phone]                Discharge Exam: Natalie Edwards   12/23/23 0400  Weight: 77.6 kg   ***  Condition at discharge: good  The results of significant diagnostics from this hospitalization (including imaging, microbiology, ancillary and laboratory) are listed below for reference.   Imaging Studies: CT ABDOMEN PELVIS W CONTRAST Result Date: 12/23/2023 CLINICAL DATA:  Left lower quadrant abdominal pain vomiting and weakness for 4 days EXAM: CT ABDOMEN AND PELVIS WITH CONTRAST TECHNIQUE: Multidetector CT imaging of the abdomen and pelvis was performed using the standard protocol following bolus administration of intravenous contrast. RADIATION DOSE REDUCTION: This exam was performed according to the departmental dose-optimization program which includes automated exposure control, adjustment of the mA and/or kV according to patient size and/or use of iterative reconstruction technique. CONTRAST:  75mL OMNIPAQUE  IOHEXOL  350 MG/ML SOLN COMPARISON:  12/20/2023 FINDINGS: Lower chest: No acute abnormality. Hepatobiliary: No focal liver abnormality is seen. Status post cholecystectomy. Unchanged postoperative biliary ductal dilatation. Pancreas: Unremarkable. No pancreatic ductal dilatation or surrounding inflammatory changes. Spleen: Normal in size  without significant abnormality. Adrenals/Urinary Tract: Unchanged benign left adrenal adenoma. Normal right adrenal. Benign bilateral renal cortical and parapelvic cysts for which no specific follow-up is required. Kidneys are otherwise normal, without renal calculi, solid lesion, or hydronephrosis. Bladder is unremarkable. Stomach/Bowel: Stomach is within normal limits. Appendix appears normal. No evidence of bowel wall thickening, distention, or inflammatory changes. Descending and sigmoid diverticulosis. Rectosigmoid colon resection and reanastomosis. Vascular/Lymphatic: Severe aortic atherosclerosis. No enlarged abdominal or pelvic lymph nodes. Reproductive: Hysterectomy. Other: No abdominal wall hernia or abnormality. No ascites. Musculoskeletal: No acute or significant osseous findings. IMPRESSION: 1. No acute CT findings of the abdomen or pelvis to explain left lower quadrant abdominal pain. 2. Descending and sigmoid diverticulosis without evidence of acute diverticulitis at this time, previous inflammatory findings of the descending colon resolved. 3. Rectosigmoid colon resection and reanastomosis. 4. Cholecystectomy and hysterectomy. Aortic Atherosclerosis (ICD10-I70.0). Electronically Signed   By: Marolyn JONETTA Jaksch M.D.   On: 12/23/2023 05:34   CT ABDOMEN PELVIS W CONTRAST Result Date: 12/20/2023 EXAM: CT ABDOMEN AND PELVIS WITH CONTRAST 12/20/2023 09:35:22 AM TECHNIQUE: CT of the abdomen and pelvis was performed with the administration of 100 mL of iohexol  (OMNIPAQUE ) 300 MG/ML solution. Multiplanar reformatted images are provided for review. Automated exposure control, iterative  reconstruction, and/or weight-based adjustment of the mA/kV was utilized to reduce the radiation dose to as low as reasonably achievable. COMPARISON: 12/18/2023 and previous. CLINICAL HISTORY: LLQ abdominal pain. Pt arriving POV with abdominal pain, pt states she was diagnosed with diverticulitis a few days ago. Pain 6/10. N/V.  Last able to eat on Friday. FINDINGS: LOWER CHEST: No acute abnormality. LIVER: The liver is unremarkable. GALLBLADDER AND BILE DUCTS: Cholecystectomy clips. No biliary ductal dilatation. SPLEEN: No acute abnormality. PANCREAS: No acute abnormality. ADRENAL GLANDS: 1.9 cm left adrenal nodule present since 07/31/2016 consistent with benign process such as adenoma. KIDNEYS, URETERS AND BLADDER: Multiple cortical lesions in kidneys, some of which can be characterized as simple cysts, largest 4.1 cm 22 HU mid left kidney. Per consensus, no follow-up is needed for simple Bosniak type 1 and 2 renal cysts, unless the patient has a malignancy history or risk factors. No stones in the kidneys or ureters. No hydronephrosis. No perinephric or periureteral stranding. Urinary bladder is unremarkable. GI AND BOWEL: Stomach demonstrates no acute abnormality. The mild inflammatory/edematous changes adjacent to the descending colon are marginally less conspicuous. A few scattered distal descending/proximal sigmoid diverticula without adjacent inflammatory change. Staple line in the proximal sigmoid colon. There is no bowel obstruction. PERITONEUM AND RETROPERITONEUM: No ascites. No free air. VASCULATURE: Aorta is normal in caliber. LYMPH NODES: No lymphadenopathy. REPRODUCTIVE ORGANS: Partial hysterectomy. BONES AND SOFT TISSUES: No acute osseous abnormality. No focal soft tissue abnormality. IMPRESSION: 1. No acute findings. 2. Diverticulosis with decreased  inflammatory change. Electronically signed by: Katheleen Faes MD 12/20/2023 10:09 AM EDT RP Workstation: [id number]   CT ABDOMEN PELVIS W CONTRAST Result Date: 12/18/2023 EXAM: CT ABDOMEN AND PELVIS WITH CONTRAST 12/18/2023 03:13:01 AM TECHNIQUE: CT of the abdomen and pelvis was performed with the administration of 75 mL of iohexol  (OMNIPAQUE ) 300 MG/ML solution. Multiplanar reformatted images are provided for review. Automated exposure control, iterative reconstruction,  and/or weight-based adjustment of the mA/kV was utilized to reduce the radiation dose to as low as reasonably achievable. COMPARISON: None available. CLINICAL HISTORY: Abdominal pain, post-op. nausea and left sided abdominal pain over the last two weeks, wbc's 12.2, GFR 42; 75 ml omni 300. FINDINGS: LOWER CHEST: No acute abnormality. LIVER: Hepatic steatosis. GALLBLADDER AND BILE DUCTS: Cholecystectomy. Dilated common bile duct measuring up to 13 mm likely due to reservoir effect post-cholecystectomy. SPLEEN: No acute abnormality. PANCREAS: No acute abnormality. ADRENAL GLANDS: Stable left adrenal nodule previously characterized as an adenoma. KIDNEYS, URETERS AND BLADDER: No stones in the kidneys or ureters. No hydronephrosis. No perinephric or periureteral stranding. Urinary bladder is unremarkable. GI AND BOWEL: Stomach demonstrates no acute abnormality. Postoperative change about the sigmoid colon. There is trace stranding about a few descending colon diverticula compatible with mild diverticulitis. Normal appendix. There is no bowel obstruction. PERITONEUM AND RETROPERITONEUM: No ascites. No free air. VASCULATURE: Aorta is normal in caliber. Aortic atherosclerotic calcification. LYMPH NODES: No lymphadenopathy. REPRODUCTIVE ORGANS: No acute abnormality. BONES AND SOFT TISSUES: No acute osseous abnormality. No focal soft tissue abnormality. IMPRESSION: 1. Mild diverticulitis in the descending colon. 2. Dilated common bile duct up to 13 mm, likely secondary to post-cholecystectomy reservoir effect. Electronically signed by: Norman Gatlin MD 12/18/2023 03:22 AM EDT RP Workstation: [id number]    Microbiology: Results for orders placed or performed during the hospital encounter of 12/20/23  Blood culture (routine x 2)     Status: None   Collection Time: 12/20/23  9:03 AM   Specimen: BLOOD  Result Value Ref Range  Status   Specimen Description   Final    BLOOD BLOOD LEFT ARM Performed at [facility], [address] [place], [place] [address]    Special Requests   Final    BOTTLES DRAWN AEROBIC AND ANAEROBIC Blood Culture adequate volume Performed at [facility], [address], [place], [place] [address]    Culture   Final    NO GROWTH 5 DAYS Performed at [facility], [address] [place], [place] [address]    Report Status 12/25/2023 FINAL  Final  Blood culture (routine x 2)     Status: None   Collection Time: 12/20/23  9:20 AM   Specimen: BLOOD  Result Value Ref Range Status   Specimen Description   Final    BLOOD BLOOD RIGHT HAND Performed at [facility], [address], [place], [place] [address]    Special Requests   Final    BOTTLES DRAWN AEROBIC AND ANAEROBIC Blood Culture results may not be optimal due to an inadequate volume of blood received in culture bottles Performed at [facility], [address], [place], [place] [address]    Culture   Final    NO GROWTH 5 DAYS Performed at [facility], [address] [place], [place] [address]    Report Status 12/25/2023 FINAL  Final    Labs: CBC: Recent Labs  Lab 12/20/23 0830 12/21/23 2020 12/23/23 0403 12/23/23 0433 12/24/23 1221 12/25/23 0539 12/26/23 0540  WBC 9.7 13.0* 13.6*  --  13.8* 10.6* 8.4  NEUTROABS 5.9  --  9.7*  --   --   --   --   HGB 12.0 11.4* 12.4 13.9 10.9* 10.1* 10.1*  HCT 37.5 35.0* 37.5 41.0 32.5* 30.9* 31.2*  MCV 81.0 80.6 78.6*  --  79.5* 80.5 82.3  PLT 506* 446* 471*  --  431* 401* 385   Basic Metabolic Panel: Recent Labs  Lab 12/21/23 2020 12/23/23 0403 12/23/23 0433 12/24/23 1221 12/25/23 0539 12/26/23 0540  NA 135 143 147* 140 141 140  K 3.3* 2.7* 2.7* 4.3 3.7 3.8  CL 105 112* 114* 112* 111 109  CO2 15* 14*  --  18* 19* 19*  GLUCOSE 128* 128* 129* 113* 88 94  BUN 5* 5* 4* <5* <5* 11  CREATININE 0.96 1.35* 1.20* 1.02* 1.03* 1.17*  CALCIUM  9.7 9.1  --  8.7* 8.6* 8.6*  MG  --   1.5*  --  1.8  --   --    Liver Function Tests: Recent Labs  Lab 12/20/23 0830 12/21/23 2020 12/23/23 0403  AST 20 26 21   ALT 22 19 19   ALKPHOS 70 65 50  BILITOT 0.4 0.4 0.9  PROT 7.9 7.6 7.3  ALBUMIN  4.5 4.4 3.9   CBG: No results for input(s): GLUCAP in the last 168 hours.  Discharge time spent: 45 minutes.  Signed: Lakedra Washington Al-Sultani, MD [facility] 12/26/2023

## 2023-12-26 NOTE — Plan of Care (Signed)

## 2023-12-26 NOTE — Op Note (Signed)
 [facility] Patient Name: Natalie Edwards Procedure Date : 12/26/2023 MRN: [id number] Attending MD: Elspeth SQUIBB. Leigh , MD, [id number] Date of Birth: [birth year] CSN: [id number] Age: 58 Admit Type: Inpatient Procedure:                Upper GI endoscopy Indications:              Abdominal pain, Nausea - history of diverticulitis                            with surgical resection but persistent chronic pain                            in the LLQ since that time, history of peptic                            ulcers and persistent nausea, EGD to clear upper                            tract. Of note she has had a prominent antral fold                            / polyp noted in the past on prior EGD Providers:                Elspeth SQUIBB. Leigh, MD, Collene Edu, RN, Haskel Chris, Technician Referring MD:              Medicines:                Monitored Anesthesia Care Complications:            No immediate complications. Estimated blood loss:                            Minimal. Estimated Blood Loss:     Estimated blood loss was minimal. Procedure:                Pre-Anesthesia Assessment:                           - Prior to the procedure, a History and Physical                            was performed, and patient medications and                            allergies were reviewed. The patient's tolerance of                            previous anesthesia was also reviewed. The risks                            and benefits of the procedure and the sedation  options and risks were discussed with the patient.                            All questions were answered, and informed consent                            was obtained. Prior Anticoagulants: The patient has                            taken no anticoagulant or antiplatelet agents. ASA                            Grade Assessment: III - A patient with severe                             systemic disease. After reviewing the risks and                            benefits, the patient was deemed in satisfactory                            condition to undergo the procedure.                           After obtaining informed consent, the endoscope was                            passed under direct vision. Throughout the                            procedure, the patient's blood pressure, pulse, and                            oxygen saturations were monitored continuously. The                            [id number]-[id number] ([id number]) Olympus endoscope was introduced                            through the mouth, and advanced to the second part                            of duodenum. The upper GI endoscopy was                            accomplished without difficulty. The patient                            tolerated the procedure well. Scope In: Scope Out: Findings:      Esophagogastric landmarks were identified: the Z-line was found at 36       cm, the gastroesophageal junction was found at 36 cm and the upper       extent of the gastric folds was found at 37 cm from  the incisors.      A 1 cm hiatal hernia was present.      The exam of the esophagus was otherwise normal.      A single 25 mm sessile polypoid lesion vs. prominent fold was found in       the prepyloric region of the stomach. There was one area of small       ulceration on the lesion, and a few erosions in close proximity.       Biopsies were taken with a cold forceps for histology. The pyloric       channel was widely patent and this lesion does not encroach on the       pyloric channel.      The exam of the stomach was otherwise normal.      Biopsies were taken with a cold forceps for Helicobacter pylori testing.      The examined duodenum was normal. Impression:               - Esophagogastric landmarks identified.                           - 1 cm hiatal hernia.                           - Normal esophagus  otherwise.                           - A single gastric polypoid lesion vs. prominent /                            inflamed fold in the antrum, small area of                            ulceration there and some erosions. Biopsied.                           - Normal stomach otherwise - biopsies taken to rule                            out H pylori.                           - Normal examined duodenum.                           Overall, while gastric antral lesion noted, this                            has been seen in the past with negative biopsies -                            unclear if prominent fold vs. polypoid lesion, but                            it does not impinge on the pylorus which is widely  patent. Biopsies taken to rule out H pylori. No                            obvious cause for symptoms otherwise. She has                            improved with reinitiation of PPI, standing Zofran ,                            and Bentyl . Recommendation:           - Return patient to hospital ward for ongoing care.                           - Advance diet as tolerated.                           - Continue present medications.                           - Continue protonix  40mg  twice daily                           - Continue standing Zofran  4mg  three times daily                           - Continue bentyl  for lower abdominal pain - I                            suspect she has postoperative / neuropathic pain                            there since her surgery                           - Await pathology results. We can discuss as                            outpatient if this antral change warrants removal,                            will await pathology results                           - We can coordinate office follow up with Dr. Charlanne                            as outpatient - primary GI MD, following discharge                           - We will sign off for  now, call with questions in                            the interim. Procedure Code(s):        ---  Professional ---                           [phone], Esophagogastroduodenoscopy, flexible,                            transoral; with biopsy, single or multiple Diagnosis Code(s):        --- Professional ---                           K44.9, Diaphragmatic hernia without obstruction or                            gangrene                           K31.7, Polyp of stomach and duodenum                           R10.9, Unspecified abdominal pain                           R11.0, Nausea CPT copyright 2022 [employer]. All rights reserved. The codes documented in this report are preliminary and upon coder review may  be revised to meet current compliance requirements. Elspeth P. Leigh, MD 12/26/2023 10:22:18 AM This report has been signed electronically. Number of Addenda: 0

## 2023-12-27 ENCOUNTER — Encounter (HOSPITAL_COMMUNITY): Payer: Self-pay | Admitting: Gastroenterology

## 2023-12-27 DIAGNOSIS — N3000 Acute cystitis without hematuria: Secondary | ICD-10-CM | POA: Insufficient documentation

## 2023-12-27 DIAGNOSIS — E872 Acidosis, unspecified: Secondary | ICD-10-CM | POA: Insufficient documentation

## 2023-12-27 DIAGNOSIS — E876 Hypokalemia: Secondary | ICD-10-CM | POA: Insufficient documentation

## 2023-12-27 DIAGNOSIS — Z720 Tobacco use: Secondary | ICD-10-CM | POA: Insufficient documentation

## 2023-12-27 NOTE — Discharge Summary (Signed)
 Physician Discharge Summary   Patient: Natalie Edwards MRN: [id number] DOB: [birth year]  Admit date:     12/23/2023  Discharge date: 12/26/2023  Discharge Physician: Duffy Al-Sultani   PCP: Tanda Bleacher, MD   Recommendations at discharge:  {Tip this will not be part of the note when signed- Example include specific recommendations for outpatient follow-up, pending tests to follow-up on. (Optional):26781} Follow up with PCP within 1 week Follow up with GI within 1 week  Discharge Diagnoses: Principal Problem:   Intractable nausea and vomiting Active Problems:   AKI (acute kidney injury)   Essential hypertension   Anxiety   Gastric polyp   High anion gap metabolic acidosis   Postprandial nausea   MDD (major depressive disorder)   History of colon resection   History of peptic ulcer   Acute cystitis   Lactic acidosis   Hypokalemia   Hypomagnesemia   Tobacco abuse   Hospital Course: Patient is a 58 year old female with past medical history significant for diverticulitis, hypertension, IDA secondary to chronic blood loss, PUD with hemorrhage, GERD, GAD, MDD, prediabetes, cocaine and alcohol abuse, history of suicidal ideation, who was recently admitted on 8/20 to 8/26 for unknown drug overdose and required IP psych admission for monitoring as well as recent admission for diverticulitis from 8/7 to 8/15.  Patient presents to the ED on 12/23/2023 with complaints of recurrent intractable nausea and vomiting in the setting of acute cystitis.   Assessment and Plan: No notes have been filed under this hospital service. Service: Hospitalist       {Tip this will not be part of the note when signed Body mass index is 30.29 kg/m. , ,  (Optional):26781}  {(NOTE) Pain control PDMP Statment (Optional):26782} Consultants: *** Procedures performed: ***  Disposition: {Plan; Disposition:26390} Diet recommendation:  {Diet_Plan:26776} DISCHARGE MEDICATION: Allergies as of 12/26/2023        Reactions   Hydroxyzine  Palpitations, Other (See Comments)   Jitteriness, too   Nsaids Other (See Comments)   History of ulcers   Ambien  [zolpidem  Tartrate] Other (See Comments)   Jitteriness, nervousness, abdominal pain   Ibuprofen  Other (See Comments)   History of ulcers        Medication List     STOP taking these medications    ciprofloxacin  500 MG tablet Commonly known as: CIPRO    metroNIDAZOLE  500 MG tablet Commonly known as: FLAGYL    prochlorperazine  10 MG tablet Commonly known as: COMPAZINE        TAKE these medications    dicyclomine  10 MG capsule Commonly known as: BENTYL  Take 1 capsule (10 mg total) by mouth 3 (three) times daily before meals.   FLUoxetine  40 MG capsule Commonly known as: PROZAC  Take 1 capsule (40 mg total) by mouth daily. What changed: when to take this   hydrochlorothiazide  25 MG tablet Commonly known as: HYDRODIURIL  Take 25 mg by mouth daily.   HYDROcodone -acetaminophen  5-325 MG tablet Commonly known as: NORCO/VICODIN Take 1 tablet by mouth every 6 (six) hours as needed. What changed: reasons to take this   metoCLOPramide  10 MG tablet Commonly known as: REGLAN  Take 1 tablet (10 mg total) by mouth every 6 (six) hours. What changed:  when to take this reasons to take this   mirtazapine  45 MG tablet Commonly known as: REMERON  Take 45 mg by mouth at bedtime.   nicotine  14 mg/24hr patch Commonly known as: NICODERM CQ  - dosed in mg/24 hours Place 1 patch (14 mg total) onto the skin daily at 6 (six) AM.  ondansetron  4 MG disintegrating tablet Commonly known as: ZOFRAN -ODT Take 1 tablet (4 mg total) by mouth every 8 (eight) hours.   oxyCODONE -acetaminophen  5-325 MG tablet Commonly known as: PERCOCET/ROXICET Take 1-2 tablets by mouth every 6 (six) hours as needed for severe pain (pain score 7-10).   pantoprazole  40 MG tablet Commonly known as: PROTONIX  Take 1 tablet (40 mg total) by mouth 2 (two) times daily.    propranolol  10 MG tablet Commonly known as: INDERAL  Take 10 mg by mouth daily.   QUEtiapine  200 MG tablet Commonly known as: SEROQUEL  Take 200 mg by mouth at bedtime.   sucralfate  1 GM/10ML suspension Commonly known as: CARAFATE  Take 10 mLs (1 g total) by mouth 4 (four) times daily -  with meals and at bedtime.   traZODone  50 MG tablet Commonly known as: DESYREL  Take 1 tablet (50 mg total) by mouth at bedtime as needed for sleep. What changed:  medication strength how much to take        Follow-up Information     Armbruster, Elspeth SQUIBB, MD Follow up in 2 week(s).   Specialty: Gastroenterology Contact information: [address] [place] [address] [place] [address] [phone]         Tanda Bleacher, MD. Schedule an appointment as soon as possible for a visit in 1 week(s).   Specialty: Family Medicine Contact information: [address] [place] [address] [phone]                Discharge Exam: Fredricka Weights   12/23/23 0400  Weight: 77.6 kg   ***  Condition at discharge: {DC Condition:26389}  The results of significant diagnostics from this hospitalization (including imaging, microbiology, ancillary and laboratory) are listed below for reference.   Imaging Studies: CT ABDOMEN PELVIS W CONTRAST Result Date: 12/23/2023 CLINICAL DATA:  Left lower quadrant abdominal pain vomiting and weakness for 4 days EXAM: CT ABDOMEN AND PELVIS WITH CONTRAST TECHNIQUE: Multidetector CT imaging of the abdomen and pelvis was performed using the standard protocol following bolus administration of intravenous contrast. RADIATION DOSE REDUCTION: This exam was performed according to the departmental dose-optimization program which includes automated exposure control, adjustment of the mA and/or kV according to patient size and/or use of iterative reconstruction technique. CONTRAST:  75mL OMNIPAQUE  IOHEXOL  350 MG/ML SOLN COMPARISON:  12/20/2023 FINDINGS: Lower chest:  No acute abnormality. Hepatobiliary: No focal liver abnormality is seen. Status post cholecystectomy. Unchanged postoperative biliary ductal dilatation. Pancreas: Unremarkable. No pancreatic ductal dilatation or surrounding inflammatory changes. Spleen: Normal in size without significant abnormality. Adrenals/Urinary Tract: Unchanged benign left adrenal adenoma. Normal right adrenal. Benign bilateral renal cortical and parapelvic cysts for which no specific follow-up is required. Kidneys are otherwise normal, without renal calculi, solid lesion, or hydronephrosis. Bladder is unremarkable. Stomach/Bowel: Stomach is within normal limits. Appendix appears normal. No evidence of bowel wall thickening, distention, or inflammatory changes. Descending and sigmoid diverticulosis. Rectosigmoid colon resection and reanastomosis. Vascular/Lymphatic: Severe aortic atherosclerosis. No enlarged abdominal or pelvic lymph nodes. Reproductive: Hysterectomy. Other: No abdominal wall hernia or abnormality. No ascites. Musculoskeletal: No acute or significant osseous findings. IMPRESSION: 1. No acute CT findings of the abdomen or pelvis to explain left lower quadrant abdominal pain. 2. Descending and sigmoid diverticulosis without evidence of acute diverticulitis at this time, previous inflammatory findings of the descending colon resolved. 3. Rectosigmoid colon resection and reanastomosis. 4. Cholecystectomy and hysterectomy. Aortic Atherosclerosis (ICD10-I70.0). Electronically Signed   By: Marolyn JONETTA Jaksch M.D.   On: 12/23/2023 05:34  CT ABDOMEN PELVIS W CONTRAST Result Date: 12/20/2023 EXAM: CT ABDOMEN AND PELVIS WITH CONTRAST 12/20/2023 09:35:22 AM TECHNIQUE: CT of the abdomen and pelvis was performed with the administration of 100 mL of iohexol  (OMNIPAQUE ) 300 MG/ML solution. Multiplanar reformatted images are provided for review. Automated exposure control, iterative reconstruction, and/or weight-based adjustment of the mA/kV  was utilized to reduce the radiation dose to as low as reasonably achievable. COMPARISON: 12/18/2023 and previous. CLINICAL HISTORY: LLQ abdominal pain. Pt arriving POV with abdominal pain, pt states she was diagnosed with diverticulitis a few days ago. Pain 6/10. N/V. Last able to eat on Friday. FINDINGS: LOWER CHEST: No acute abnormality. LIVER: The liver is unremarkable. GALLBLADDER AND BILE DUCTS: Cholecystectomy clips. No biliary ductal dilatation. SPLEEN: No acute abnormality. PANCREAS: No acute abnormality. ADRENAL GLANDS: 1.9 cm left adrenal nodule present since 07/31/2016 consistent with benign process such as adenoma. KIDNEYS, URETERS AND BLADDER: Multiple cortical lesions in kidneys, some of which can be characterized as simple cysts, largest 4.1 cm 22 HU mid left kidney. Per consensus, no follow-up is needed for simple Bosniak type 1 and 2 renal cysts, unless the patient has a malignancy history or risk factors. No stones in the kidneys or ureters. No hydronephrosis. No perinephric or periureteral stranding. Urinary bladder is unremarkable. GI AND BOWEL: Stomach demonstrates no acute abnormality. The mild inflammatory/edematous changes adjacent to the descending colon are marginally less conspicuous. A few scattered distal descending/proximal sigmoid diverticula without adjacent inflammatory change. Staple line in the proximal sigmoid colon. There is no bowel obstruction. PERITONEUM AND RETROPERITONEUM: No ascites. No free air. VASCULATURE: Aorta is normal in caliber. LYMPH NODES: No lymphadenopathy. REPRODUCTIVE ORGANS: Partial hysterectomy. BONES AND SOFT TISSUES: No acute osseous abnormality. No focal soft tissue abnormality. IMPRESSION: 1. No acute findings. 2. Diverticulosis with decreased  inflammatory change. Electronically signed by: Katheleen Faes MD 12/20/2023 10:09 AM EDT RP Workstation: [id number]   CT ABDOMEN PELVIS W CONTRAST Result Date: 12/18/2023 EXAM: CT ABDOMEN AND PELVIS WITH  CONTRAST 12/18/2023 03:13:01 AM TECHNIQUE: CT of the abdomen and pelvis was performed with the administration of 75 mL of iohexol  (OMNIPAQUE ) 300 MG/ML solution. Multiplanar reformatted images are provided for review. Automated exposure control, iterative reconstruction, and/or weight-based adjustment of the mA/kV was utilized to reduce the radiation dose to as low as reasonably achievable. COMPARISON: None available. CLINICAL HISTORY: Abdominal pain, post-op. nausea and left sided abdominal pain over the last two weeks, wbc's 12.2, GFR 42; 75 ml omni 300. FINDINGS: LOWER CHEST: No acute abnormality. LIVER: Hepatic steatosis. GALLBLADDER AND BILE DUCTS: Cholecystectomy. Dilated common bile duct measuring up to 13 mm likely due to reservoir effect post-cholecystectomy. SPLEEN: No acute abnormality. PANCREAS: No acute abnormality. ADRENAL GLANDS: Stable left adrenal nodule previously characterized as an adenoma. KIDNEYS, URETERS AND BLADDER: No stones in the kidneys or ureters. No hydronephrosis. No perinephric or periureteral stranding. Urinary bladder is unremarkable. GI AND BOWEL: Stomach demonstrates no acute abnormality. Postoperative change about the sigmoid colon. There is trace stranding about a few descending colon diverticula compatible with mild diverticulitis. Normal appendix. There is no bowel obstruction. PERITONEUM AND RETROPERITONEUM: No ascites. No free air. VASCULATURE: Aorta is normal in caliber. Aortic atherosclerotic calcification. LYMPH NODES: No lymphadenopathy. REPRODUCTIVE ORGANS: No acute abnormality. BONES AND SOFT TISSUES: No acute osseous abnormality. No focal soft tissue abnormality. IMPRESSION: 1. Mild diverticulitis in the descending colon. 2. Dilated common bile duct up to 13 mm, likely secondary to post-cholecystectomy reservoir effect. Electronically signed by: Norman Gatlin MD 12/18/2023 03:22  AM EDT RP Workstation: [id number]    Microbiology: Results for orders placed or  performed during the hospital encounter of 12/20/23  Blood culture (routine x 2)     Status: None   Collection Time: 12/20/23  9:03 AM   Specimen: BLOOD  Result Value Ref Range Status   Specimen Description   Final    BLOOD BLOOD LEFT ARM Performed at [facility], [address], [place], [place] [address]    Special Requests   Final    BOTTLES DRAWN AEROBIC AND ANAEROBIC Blood Culture adequate volume Performed at [facility], [address], [place], [place] [address]    Culture   Final    NO GROWTH 5 DAYS Performed at [facility], [address], [place], [place] [address]    Report Status 12/25/2023 FINAL  Final  Blood culture (routine x 2)     Status: None   Collection Time: 12/20/23  9:20 AM   Specimen: BLOOD  Result Value Ref Range Status   Specimen Description   Final    BLOOD BLOOD RIGHT HAND Performed at [facility], [address], [place], [place] [address]    Special Requests   Final    BOTTLES DRAWN AEROBIC AND ANAEROBIC Blood Culture results may not be optimal due to an inadequate volume of blood received in culture bottles Performed at [facility], [address], [place], [place] [address]    Culture   Final    NO GROWTH 5 DAYS Performed at [facility], [address], [place], [place] [address]    Report Status 12/25/2023 FINAL  Final    Labs: CBC: Recent Labs  Lab 12/21/23 2020 12/23/23 0403 12/23/23 0433 12/24/23 1221 12/25/23 0539 12/26/23 0540  WBC 13.0* 13.6*  --  13.8* 10.6* 8.4  NEUTROABS  --  9.7*  --   --   --   --   HGB 11.4* 12.4 13.9 10.9* 10.1* 10.1*  HCT 35.0* 37.5 41.0 32.5* 30.9* 31.2*  MCV 80.6 78.6*  --  79.5* 80.5 82.3  PLT 446* 471*  --  431* 401* 385   Basic Metabolic Panel: Recent Labs  Lab 12/21/23 2020 12/23/23 0403 12/23/23 0433 12/24/23 1221 12/25/23 0539 12/26/23 0540  NA 135 143 147* 140 141 140  K 3.3* 2.7* 2.7* 4.3  3.7 3.8  CL 105 112* 114* 112* 111 109  CO2 15* 14*  --  18* 19* 19*  GLUCOSE 128* 128* 129* 113* 88 94  BUN 5* 5* 4* <5* <5* 11  CREATININE 0.96 1.35* 1.20* 1.02* 1.03* 1.17*  CALCIUM  9.7 9.1  --  8.7* 8.6* 8.6*  MG  --  1.5*  --  1.8  --   --    Liver Function Tests: Recent Labs  Lab 12/21/23 2020 12/23/23 0403  AST 26 21  ALT 19 19  ALKPHOS 65 50  BILITOT 0.4 0.9  PROT 7.6 7.3  ALBUMIN  4.4 3.9   CBG: No results for input(s): GLUCAP in the last 168 hours.  Discharge time spent: {LESS THAN/GREATER UYJW:73611} 30 minutes.  Signed: Alba Kriesel Al-Sultani, MD [facility] 12/27/2023

## 2023-12-29 ENCOUNTER — Encounter: Payer: Self-pay | Admitting: Pulmonary Disease

## 2023-12-29 ENCOUNTER — Ambulatory Visit (INDEPENDENT_AMBULATORY_CARE_PROVIDER_SITE_OTHER): Admitting: Pulmonary Disease

## 2023-12-29 ENCOUNTER — Ambulatory Visit: Payer: Self-pay | Admitting: Gastroenterology

## 2023-12-29 VITALS — BP 133/83 | HR 83 | Ht 63.0 in | Wt 175.0 lb

## 2023-12-29 DIAGNOSIS — F32A Depression, unspecified: Secondary | ICD-10-CM

## 2023-12-29 DIAGNOSIS — F5104 Psychophysiologic insomnia: Secondary | ICD-10-CM | POA: Diagnosis not present

## 2023-12-29 DIAGNOSIS — F419 Anxiety disorder, unspecified: Secondary | ICD-10-CM

## 2023-12-29 LAB — SURGICAL PATHOLOGY

## 2023-12-29 MED ORDER — TEMAZEPAM 15 MG PO CAPS: 15.0000 mg | ORAL_CAPSULE | Freq: Every evening | ORAL | 0 refills | Status: DC | PRN

## 2023-12-29 NOTE — Progress Notes (Signed)
 Subjective:    Patient ID: Natalie Edwards, female    DOB: October 17, 1965, 58 y.o.   MRN: [id number]  Patient with a history of chronic insomnia, has been difficult to treat  Was recently hospitalized for suicide attempt Tested positive for cocaine during hospitalization  During the hospitalization was prescribed trazodone , mirtazapine  - She states currently she is not using trazodone  because she does not like the way it makes her feel -She is also not using mirtazapine   Maintains she remains on Prozac   Previously had noted that Belsomra , Restoril  did not help  Feels her sleep is better  She is an active smoker  She does have a history of snoring, no witnessed apneas She is only sleepy during the day when she does not get adequate rest at night Occasional dryness of her mouth in the mornings She does have headaches  No family history of sleep disordered breathing  Has a lot of stressors   Continues to smoke actively Past Medical History:  Diagnosis Date   Allergy    Anemia    Diverticulosis    Gall bladder stones 1993   Gastric ulcer    GERD (gastroesophageal reflux disease)    Hypertension    Renal mass    Social History   Socioeconomic History   Marital status: Single    Spouse name: Not on file   Number of children: 3   Years of education: Not on file   Highest education level: 12th grade  Occupational History   Occupation: environmental services at [employer]  Tobacco Use   Smoking status: Every Day    Current packs/day: 0.50    Average packs/day: 0.5 packs/day for 25.0 years (12.5 ttl pk-yrs)    Types: Cigarettes   Smokeless tobacco: Never   Tobacco comments:    Pt tried to quit - helps with anxiety     1 pack last patient 3 days   Vaping Use   Vaping status: Never Used  Substance and Sexual Activity   Alcohol use: Yes    Comment: occasionally - last drink was July 4, 1 shot   Drug use: No   Sexual activity: Yes    Comment: hysterectomy  Other  Topics Concern   Not on file  Social History Narrative   Lives at home in an apartment. Her mother lives with her.    Right handed   Caffeine: drinks approx. 36 oz of pepsi per day. Sometimes drinks 2 cups of coffee in a day as well.    Social Drivers of Health   Financial Resource Strain: Medium Risk (12/28/2023)   Overall Financial Resource Strain (CARDIA)    Difficulty of Paying Living Expenses: Somewhat hard  Food Insecurity: Food Insecurity Present (12/28/2023)   Hunger Vital Sign    Worried About Running Out of Food in the Last Year: Sometimes true    Ran Out of Food in the Last Year: Sometimes true  Transportation Needs: No Transportation Needs (12/28/2023)   PRAPARE - Administrator, Civil Service (Medical): No    Lack of Transportation (Non-Medical): No  Recent Concern: Transportation Needs - Unmet Transportation Needs (12/23/2023)   PRAPARE - Transportation    Lack of Transportation (Medical): No    Lack of Transportation (Non-Medical): Yes  Physical Activity: Insufficiently Active (12/28/2023)   Exercise Vital Sign    Days of Exercise per Week: 1 day    Minutes of Exercise per Session: 10 min  Stress: Stress Concern Present (12/28/2023)  [employer] of Occupational Health - Occupational Stress Questionnaire    Feeling of Stress: To some extent  Social Connections: Socially Isolated (12/28/2023)   Social Connection and Isolation Panel    Frequency of Communication with Friends and Family: More than three times a week    Frequency of Social Gatherings with Friends and Family: Once a week    Attends Religious Services: Never    Database administrator or Organizations: No    Attends Engineer, structural: Not on file    Marital Status: Separated  Intimate Partner Violence: Unknown (12/23/2023)   Humiliation, Afraid, Rape, and Kick questionnaire    Fear of Current or Ex-Partner: Patient declined    Emotionally Abused: No    Physically Abused:  No    Sexually Abused: No   Family History  Problem Relation Age of Onset   Cancer Maternal Uncle        Lung   Cancer Maternal Uncle        Lung   Cancer Maternal Uncle        Lung   Headache Neg Hx        I don't think so   Migraines Neg Hx        I don't think so   Review of Systems  Constitutional:  Negative for fatigue and fever.  Respiratory: Negative.  Negative for apnea.   Cardiovascular: Negative.   Gastrointestinal: Negative.   Allergic/Immunologic: Negative for immunocompromised state.  Psychiatric/Behavioral:  Positive for sleep disturbance. Negative for dysphoric mood. The patient is nervous/anxious.       Objective:   Physical Exam Constitutional:      Appearance: Normal appearance.  HENT:     Head: Normocephalic.     Mouth/Throat:     Mouth: Mucous membranes are moist.     Comments: Mallampati 1, Eyes:     General:        Right eye: No discharge.        Left eye: No discharge.  Cardiovascular:     Rate and Rhythm: Normal rate and regular rhythm.     Pulses: Normal pulses.     Heart sounds: No murmur heard.    No friction rub.  Pulmonary:     Effort: Pulmonary effort is normal. No respiratory distress.     Breath sounds: No stridor. No wheezing or rhonchi.  Musculoskeletal:     Cervical back: No rigidity or tenderness.  Neurological:     Mental Status: She is alert.  Psychiatric:        Mood and Affect: Mood normal.    Vitals:   12/29/23 1450  BP: 133/83  Pulse: 83  SpO2: 97%       Assessment & Plan:    Sleep maintenance insomnia  Since she is doing well with 15 mg Restoril   Will prescribe Restoril  15 mg for 30-day supply with no refills Patient encouraged to reach out to us  for refills as needed  She will be following up with her primary care doctor in a few days  I did reach out to Dr. Raguel Blush following her last visit in September Risks with taking multiple sleep aids reiterated  Encouraged to continue follow-up  with psychiatry  Follow-up in 3 months

## 2023-12-29 NOTE — Patient Instructions (Signed)
 Prescription for temazepam  15 mg sent to pharmacy for you  Follow-up in 3 months  Call us  with significant concerns  Call us  within a few days of the prescription running out for refills

## 2023-12-31 ENCOUNTER — Encounter: Payer: Self-pay | Admitting: Family Medicine

## 2023-12-31 ENCOUNTER — Ambulatory Visit (INDEPENDENT_AMBULATORY_CARE_PROVIDER_SITE_OTHER): Admitting: Family Medicine

## 2023-12-31 VITALS — BP 136/77 | HR 82 | Ht 63.0 in | Wt 172.4 lb

## 2023-12-31 DIAGNOSIS — R112 Nausea with vomiting, unspecified: Secondary | ICD-10-CM | POA: Diagnosis not present

## 2023-12-31 DIAGNOSIS — F32A Depression, unspecified: Secondary | ICD-10-CM

## 2023-12-31 DIAGNOSIS — Z09 Encounter for follow-up examination after completed treatment for conditions other than malignant neoplasm: Secondary | ICD-10-CM

## 2023-12-31 DIAGNOSIS — K5790 Diverticulosis of intestine, part unspecified, without perforation or abscess without bleeding: Secondary | ICD-10-CM

## 2023-12-31 DIAGNOSIS — I10 Essential (primary) hypertension: Secondary | ICD-10-CM

## 2023-12-31 DIAGNOSIS — F419 Anxiety disorder, unspecified: Secondary | ICD-10-CM

## 2023-12-31 NOTE — Anesthesia Preprocedure Evaluation (Signed)
 Anesthesia Evaluation  Patient identified by MRN, date of birth, ID band Patient awake    Reviewed: Allergy & Precautions, NPO status , Patient's Chart, lab work & pertinent test results  History of Anesthesia Complications Negative for: history of anesthetic complications  Airway Mallampati: III       Dental  (+) Dental Advisory Given   Pulmonary Current Smoker and Patient abstained from smoking.   breath sounds clear to auscultation       Cardiovascular hypertension,  Rhythm:Regular     Neuro/Psych  PSYCHIATRIC DISORDERS Anxiety Depression     Neuromuscular disease    GI/Hepatic PUD,GERD  ,,  Endo/Other    Renal/GU Renal disease     Musculoskeletal   Abdominal   Peds  Hematology  (+) Blood dyscrasia, anemia   Anesthesia Other Findings   Reproductive/Obstetrics                              Anesthesia Physical Anesthesia Plan  ASA: 3  Anesthesia Plan: MAC   Post-op Pain Management: Minimal or no pain anticipated   Induction: Intravenous  PONV Risk Score and Plan: 1 and Propofol  infusion and Treatment may vary due to age or medical condition  Airway Management Planned: Nasal Cannula, Natural Airway and Simple Face Mask  Additional Equipment: None  Intra-op Plan:   Post-operative Plan:   Informed Consent: I have reviewed the patients History and Physical, chart, labs and discussed the procedure including the risks, benefits and alternatives for the proposed anesthesia with the patient or authorized representative who has indicated his/her understanding and acceptance.     Dental advisory given  Plan Discussed with: CRNA  Anesthesia Plan Comments:         Anesthesia Quick Evaluation

## 2023-12-31 NOTE — Anesthesia Postprocedure Evaluation (Signed)
 Anesthesia Post Note  Patient: Natalie Edwards  Procedure(s) Performed: EGD (ESOPHAGOGASTRODUODENOSCOPY) BIOPSY, GI     Patient location during evaluation: Endoscopy Anesthesia Type: MAC Level of consciousness: awake and alert Pain management: pain level controlled Vital Signs Assessment: post-procedure vital signs reviewed and stable Respiratory status: spontaneous breathing, nonlabored ventilation, respiratory function stable and patient connected to nasal cannula oxygen Cardiovascular status: blood pressure returned to baseline and stable Postop Assessment: no apparent nausea or vomiting Anesthetic complications: no   There were no known notable events for this encounter.           Laurina Fischl

## 2023-12-31 NOTE — Progress Notes (Signed)
 Established Patient Office Visit  Subjective    Patient ID: Natalie Edwards, female    DOB: [birth year]  Age: 58 y.o. MRN: [id number]  CC:  Chief Complaint  Patient presents with   Medical Management of Chronic Issues    HPI [name] presents for hospital discharge  follow up where she was for diverticular disease with intractable nausea and vomiting.Patient reports that she is gradually improving.    Outpatient Encounter Medications as of 12/31/2023  Medication Sig   dicyclomine  (BENTYL ) 10 MG capsule Take 1 capsule (10 mg total) by mouth 3 (three) times daily before meals.   FLUoxetine  (PROZAC ) 40 MG capsule Take 1 capsule (40 mg total) by mouth daily. (Patient taking differently: Take 40 mg by mouth 2 (two) times daily.)   hydrochlorothiazide  (HYDRODIURIL ) 25 MG tablet Take 25 mg by mouth daily.   HYDROcodone -acetaminophen  (NORCO/VICODIN) 5-325 MG tablet Take 1 tablet by mouth every 6 (six) hours as needed. (Patient taking differently: Take 1 tablet by mouth every 6 (six) hours as needed for moderate pain (pain score 4-6) or severe pain (pain score 7-10).)   ondansetron  (ZOFRAN -ODT) 4 MG disintegrating tablet Take 1 tablet (4 mg total) by mouth every 8 (eight) hours.   pantoprazole  (PROTONIX ) 40 MG tablet Take 1 tablet (40 mg total) by mouth 2 (two) times daily.   temazepam  (RESTORIL ) 15 MG capsule Take 1 capsule (15 mg total) by mouth at bedtime as needed for sleep.   metoCLOPramide  (REGLAN ) 10 MG tablet Take 1 tablet (10 mg total) by mouth every 6 (six) hours. (Patient taking differently: Take 10 mg by mouth daily as needed for nausea or vomiting.)   mirtazapine  (REMERON ) 45 MG tablet Take 45 mg by mouth at bedtime.   nicotine  (NICODERM CQ  - DOSED IN MG/24 HOURS) 14 mg/24hr patch Place 1 patch (14 mg total) onto the skin daily at 6 (six) AM.   oxyCODONE -acetaminophen  (PERCOCET/ROXICET) 5-325 MG tablet Take 1-2 tablets by mouth every 6 (six) hours as needed for severe pain (pain score  7-10). (Patient not taking: Reported on 12/31/2023)   propranolol  (INDERAL ) 10 MG tablet Take 10 mg by mouth daily. (Patient not taking: Reported on 12/31/2023)   QUEtiapine  (SEROQUEL ) 200 MG tablet Take 200 mg by mouth at bedtime.   sucralfate  (CARAFATE ) 1 GM/10ML suspension Take 10 mLs (1 g total) by mouth 4 (four) times daily -  with meals and at bedtime.   traZODone  (DESYREL ) 50 MG tablet Take 1 tablet (50 mg total) by mouth at bedtime as needed for sleep.   No facility-administered encounter medications on file as of 12/31/2023.    Past Medical History:  Diagnosis Date   Allergy    Anemia    Diverticulosis    Gall bladder stones 1993   Gastric ulcer    GERD (gastroesophageal reflux disease)    Hypertension    Renal mass     Past Surgical History:  Procedure Laterality Date   ABDOMINAL HYSTERECTOMY     BIOPSY  09/29/2022   Procedure: BIOPSY;  Surgeon: Shila Gustav GAILS, MD;  Location: [facility];  Service: Gastroenterology;;   BONE BIOPSY  12/26/2023   Procedure: BIOPSY, GI;  Surgeon: Leigh Elspeth SQUIBB, MD;  Location: [facility];  Service: Gastroenterology;;   CHOLECYSTECTOMY     COLONOSCOPY WITH ESOPHAGOGASTRODUODENOSCOPY (EGD)  04/07/2023   [facility]   ESOPHAGOGASTRODUODENOSCOPY N/A 12/26/2023   Procedure: EGD (ESOPHAGOGASTRODUODENOSCOPY);  Surgeon: Leigh Elspeth SQUIBB, MD;  Location: [facility] END[facility]COPY;  Service: Gastroenterology;  Laterality:  N/A;   ESOPHAGOGASTRODUODENOSCOPY (EGD) WITH PROPOFOL  N/A 09/29/2022   Procedure: ESOPHAGOGASTRODUODENOSCOPY (EGD) WITH PROPOFOL ;  Surgeon: Shila Gustav GAILS, MD;  Location: [facility];  Service: Gastroenterology;  Laterality: N/A;   LAPAROSCOPIC SIGMOID COLECTOMY N/A 10/27/2023   Procedure: COLECTOMY, SIGMOID, LAPAROSCOPIC;  Surgeon: Lyndel Deward PARAS, MD;  Location: [facility] OR;  Service: General;  Laterality: N/A;   MOBILIZATION, SPLENIC FLEXURE, WITH PARTIAL COLECTOMY N/A 10/27/2023   Procedure: MOBILIZATION, SPLENIC  FLEXURE;  Surgeon: Lyndel Deward PARAS, MD;  Location: [facility] OR;  Service: General;  Laterality: N/A;    Family History  Problem Relation Age of Onset   Cancer Maternal Uncle        Lung   Cancer Maternal Uncle        Lung   Cancer Maternal Uncle        Lung   Headache Neg Hx        I don't think so   Migraines Neg Hx        I don't think so    Social History   Socioeconomic History   Marital status: Single    Spouse name: Not on file   Number of children: 3   Years of education: Not on file   Highest education level: 12th grade  Occupational History   Occupation: environmental services at [employer]  Tobacco Use   Smoking status: Every Day    Current packs/day: 0.50    Average packs/day: 0.5 packs/day for 25.0 years (12.5 ttl pk-yrs)    Types: Cigarettes   Smokeless tobacco: Never   Tobacco comments:    Pt tried to quit - helps with anxiety     1 pack last patient 3 days   Vaping Use   Vaping status: Never Used  Substance and Sexual Activity   Alcohol use: Yes    Comment: occasionally - last drink was July 4, 1 shot   Drug use: No   Sexual activity: Yes    Comment: hysterectomy  Other Topics Concern   Not on file  Social History Narrative   Lives at home in an apartment. Her mother lives with her.    Right handed   Caffeine: drinks approx. 36 oz of pepsi per day. Sometimes drinks 2 cups of coffee in a day as well.    Social Drivers of Health   Financial Resource Strain: Medium Risk (12/28/2023)   Overall Financial Resource Strain (CARDIA)    Difficulty of Paying Living Expenses: Somewhat hard  Food Insecurity: Food Insecurity Present (12/28/2023)   Hunger Vital Sign    Worried About Running Out of Food in the Last Year: Sometimes true    Ran Out of Food in the Last Year: Sometimes true  Transportation Needs: No Transportation Needs (12/28/2023)   PRAPARE - Administrator, Civil Service (Medical): No    Lack of Transportation (Non-Medical):  No  Recent Concern: Transportation Needs - Unmet Transportation Needs (12/23/2023)   PRAPARE - Transportation    Lack of Transportation (Medical): No    Lack of Transportation (Non-Medical): Yes  Physical Activity: Insufficiently Active (12/28/2023)   Exercise Vital Sign    Days of Exercise per Week: 1 day    Minutes of Exercise per Session: 10 min  Stress: Stress Concern Present (12/28/2023)   [employer] of Occupational Health - Occupational Stress Questionnaire    Feeling of Stress: To some extent  Social Connections: Socially Isolated (12/28/2023)   Social Connection and Isolation Panel    Frequency of  Communication with Friends and Family: More than three times a week    Frequency of Social Gatherings with Friends and Family: Once a week    Attends Religious Services: Never    Database administrator or Organizations: No    Attends Engineer, structural: Not on file    Marital Status: Separated  Intimate Partner Violence: Unknown (12/23/2023)   Humiliation, Afraid, Rape, and Kick questionnaire    Fear of Current or Ex-Partner: Patient declined    Emotionally Abused: No    Physically Abused: No    Sexually Abused: No    Review of Systems  Gastrointestinal:  Positive for nausea and vomiting.  All other systems reviewed and are negative.       Objective    BP 136/77   Pulse 82   Ht 5' 3 (1.6 m)   Wt 172 lb 6.4 oz (78.2 kg)   SpO2 95%   BMI 30.54 kg/m   Physical Exam Vitals and nursing note reviewed.  Constitutional:      General: She is not in acute distress. Cardiovascular:     Rate and Rhythm: Normal rate and regular rhythm.  Pulmonary:     Effort: Pulmonary effort is normal.     Breath sounds: Normal breath sounds.  Abdominal:     Palpations: Abdomen is soft.     Tenderness: There is no abdominal tenderness.  Neurological:     General: No focal deficit present.     Mental Status: She is alert and oriented to person, place, and time.   Psychiatric:        Mood and Affect: Mood normal.        Behavior: Behavior normal.         Assessment & Plan:   Nausea and vomiting, unspecified vomiting type  Diverticular disease  Primary hypertension  Anxiety and depression  Hospital discharge follow-up   Patient improving. Continue present management. Keep scheduled follow ups with consultant  Return in about 2 months (around 03/01/2024) for follow up.   Tanda Raguel SQUIBB, MD

## 2024-01-01 ENCOUNTER — Encounter: Payer: Self-pay | Admitting: Family Medicine

## 2024-01-04 ENCOUNTER — Ambulatory Visit: Admitting: Family Medicine

## 2024-01-06 ENCOUNTER — Ambulatory Visit: Admitting: Pulmonary Disease

## 2024-01-15 ENCOUNTER — Ambulatory Visit: Admitting: Family Medicine

## 2024-01-18 ENCOUNTER — Encounter: Payer: Self-pay | Admitting: Radiology

## 2024-01-26 ENCOUNTER — Other Ambulatory Visit: Payer: Self-pay | Admitting: Pulmonary Disease

## 2024-01-26 NOTE — Telephone Encounter (Signed)
 Copied from CRM #[id number]. Topic: Clinical - Medication Refill >> Jan 26, 2024 11:43 AM Corean SAUNDERS wrote: Medication: temazepam  (RESTORIL ) 15 MG capsule [[id number]]  Has the patient contacted their pharmacy? Yes, refills required.  (Agent: If no, request that the patient contact the pharmacy for the refill. If patient does not wish to contact the pharmacy document the reason why and proceed with request.) (Agent: If yes, when and what did the pharmacy advise?)  This is the patient's preferred pharmacy:  [id number] [address], [place] - [address] [place] [address] Phone: [phone] Fax: [phone]     Is this the correct pharmacy for this prescription? Yes If no, delete pharmacy and type the correct one.   Has the prescription been filled recently? yes  Is the patient out of the medication? Yes  Has the patient been seen for an appointment in the last year OR does the patient have an upcoming appointment? Yes  Can we respond through MyChart? No  Agent: Please be advised that Rx refills may take up to 3 business days. We ask that you follow-up with your pharmacy.

## 2024-01-26 NOTE — Telephone Encounter (Signed)
 Pt requesting refill of controlled medication. Please advise, thank you!  LOV: 12/29/23

## 2024-02-04 ENCOUNTER — Ambulatory Visit (INDEPENDENT_AMBULATORY_CARE_PROVIDER_SITE_OTHER): Admitting: Gastroenterology

## 2024-02-04 ENCOUNTER — Other Ambulatory Visit: Payer: Self-pay

## 2024-02-04 ENCOUNTER — Telehealth: Payer: Self-pay

## 2024-02-04 ENCOUNTER — Encounter: Payer: Self-pay | Admitting: Gastroenterology

## 2024-02-04 VITALS — BP 140/88 | HR 81 | Ht 64.0 in | Wt 167.4 lb

## 2024-02-04 DIAGNOSIS — R1084 Generalized abdominal pain: Secondary | ICD-10-CM | POA: Diagnosis not present

## 2024-02-04 DIAGNOSIS — K219 Gastro-esophageal reflux disease without esophagitis: Secondary | ICD-10-CM

## 2024-02-04 DIAGNOSIS — Z8719 Personal history of other diseases of the digestive system: Secondary | ICD-10-CM

## 2024-02-04 DIAGNOSIS — R110 Nausea: Secondary | ICD-10-CM

## 2024-02-04 DIAGNOSIS — K279 Peptic ulcer, site unspecified, unspecified as acute or chronic, without hemorrhage or perforation: Secondary | ICD-10-CM

## 2024-02-04 DIAGNOSIS — K5900 Constipation, unspecified: Secondary | ICD-10-CM

## 2024-02-04 DIAGNOSIS — R630 Anorexia: Secondary | ICD-10-CM

## 2024-02-04 MED ORDER — ONDANSETRON 4 MG PO TBDP: 4.0000 mg | ORAL_TABLET | Freq: Three times a day (TID) | ORAL | 0 refills | Status: DC

## 2024-02-04 NOTE — Progress Notes (Signed)
 Chief Complaint:ED follow-up, diverticulitis Primary GI Doctor:Dr. Charlanne  HPI: Patient is a 58 year old female with past medical history significant for diverticulitis, hypertension, IDA secondary to chronic blood loss, PUD with hemorrhage, GERD, GAD, MDD, prediabetes, cocaine and alcohol abuse, history of suicidal ideation, who was recently admitted on 8/20 to 8/26 for unknown drug overdose and required IP psych admission for monitoring as well as recent admission for diverticulitis from 8/7 to 8/15.   Patient had undergone sigmoid colectomy in mid August 2025 due to recurrent diverticulitis.  She had a psych admission towards the end of August secondary to depression/suicidal ideation. She has had 3 recent ER visits on 10/3, 10/5, and then 12/23/2023 at which time she was admitted.  Each of these visits was with abdominal pain and nausea. CT scan on 12/18/2023 with contrast showed postop changes around the sigmoid colon with trace stranding in the descending colon consistent with mild diverticulitis.  She was started on antibiotics. She returned on 12/20/2023 with persistent symptoms and had repeat CT scan that showed some subtle improvement.  She was continued on Cipro  and Flagyl . On return to the ER on 12/23/2023 with repeat CT with contrast shows a normal-appearing stomach, no evidence of bowel wall thickening distention or inflammatory changes there is descending and sigmoid diverticulosis and an anastomosis in the rectosigmoid no active diverticulitis status post cholecystectomy and hysterectomy.   Labs on 12/23/2023 WBC of 13.6/hemoglobin 12.4/hematocrit 37.6/MCV 78.6 Lipase within normal limits Potassium 2.7/BUN 5/creatinine 1.35/LFTs within normal limits Magnesium  1.5 Lactate 2.2 [place] screen ordered but not done UA positive for ketones and protein   12/25/23 labs show: WBC 10.6/hemoglobin 10.1/hematocrit 30.9/MCV 80.5/platelets 401 Sodium 141/potassium 3.7/BUN 5/creatinine 1.03  She  was treated with IV Rocephin , as needed antiemetics and analgesics and is on PPI as well as Carafate .   12/26/23 EGD scheduled with Dr. Leigh- full report below 12/26/23 Discharged with instructions to continue protonix , zofran , and bentyl .   Interval History  Patient presents for follow-up after recent hospitalization. She reports over all she is not feeling well.   She is taking ondansetron  4 mg twice daily for nausea. She reports her appetite has decreased. She will go sometimes 24 hours without consuming anything. Her diet consists mostly of soup.   Patient also complains of constant generalized abdominal pain. She reports the pain is worse with having a BM. She is currently only taking OTC tylenol  for pain. She is unsure if she has the dicyclomine  at home. She has a BM every day that is sometimes loose and sometimes semi formed. She notes she sometimes strains. She has noted intermittent BRB with wiping.   Patient taking Pantoprazole  40 mg twice daily for GERD and history of PUD. Not taking NSAID's.   She reports she missed her surgical follow-up this morning.   Wt Readings from Last 3 Encounters:  02/04/24 167 lb 6 oz (75.9 kg)  12/31/23 172 lb 6.4 oz (78.2 kg)  12/29/23 175 lb (79.4 kg)     Past Medical History:  Diagnosis Date   Allergy    Anemia    Diverticulosis    Gall bladder stones 1993   Gastric ulcer    GERD (gastroesophageal reflux disease)    Hypertension    Renal mass     Past Surgical History:  Procedure Laterality Date   ABDOMINAL HYSTERECTOMY     BIOPSY  09/29/2022   Procedure: BIOPSY;  Surgeon: Shila Gustav GAILS, MD;  Location: [facility] ENDOSCOPY;  Service: Gastroenterology;;   BONE  BIOPSY  12/26/2023   Procedure: BIOPSY, GI;  Surgeon: Leigh Elspeth SQUIBB, MD;  Location: [facility] ENDOSCOPY;  Service: Gastroenterology;;   CHOLECYSTECTOMY     COLONOSCOPY WITH ESOPHAGOGASTRODUODENOSCOPY (EGD)  04/07/2023   [facility]   ESOPHAGOGASTRODUODENOSCOPY N/A  12/26/2023   Procedure: EGD (ESOPHAGOGASTRODUODENOSCOPY);  Surgeon: Leigh Elspeth SQUIBB, MD;  Location: [facility] ENDOSCOPY;  Service: Gastroenterology;  Laterality: N/A;   ESOPHAGOGASTRODUODENOSCOPY (EGD) WITH PROPOFOL  N/A 09/29/2022   Procedure: ESOPHAGOGASTRODUODENOSCOPY (EGD) WITH PROPOFOL ;  Surgeon: Shila Gustav GAILS, MD;  Location: [facility] ENDOSCOPY;  Service: Gastroenterology;  Laterality: N/A;   LAPAROSCOPIC SIGMOID COLECTOMY N/A 10/27/2023   Procedure: COLECTOMY, SIGMOID, LAPAROSCOPIC;  Surgeon: Lyndel Deward PARAS, MD;  Location: [facility] OR;  Service: General;  Laterality: N/A;   MOBILIZATION, SPLENIC FLEXURE, WITH PARTIAL COLECTOMY N/A 10/27/2023   Procedure: MOBILIZATION, SPLENIC FLEXURE;  Surgeon: Lyndel Deward PARAS, MD;  Location: [facility] OR;  Service: General;  Laterality: N/A;    Current Outpatient Medications  Medication Sig Dispense Refill   dicyclomine  (BENTYL ) 10 MG capsule Take 1 capsule (10 mg total) by mouth 3 (three) times daily before meals. 90 capsule 0   FLUoxetine  (PROZAC ) 40 MG capsule Take 1 capsule (40 mg total) by mouth daily. (Patient taking differently: Take 40 mg by mouth 2 (two) times daily.) 30 capsule 0   hydrochlorothiazide  (HYDRODIURIL ) 25 MG tablet Take 25 mg by mouth daily.     mirtazapine  (REMERON ) 45 MG tablet Take 45 mg by mouth at bedtime.     pantoprazole  (PROTONIX ) 40 MG tablet Take 1 tablet (40 mg total) by mouth 2 (two) times daily. 60 tablet 0   sucralfate  (CARAFATE ) 1 GM/10ML suspension Take 10 mLs (1 g total) by mouth 4 (four) times daily -  with meals and at bedtime. 420 mL 0   temazepam  (RESTORIL ) 15 MG capsule TAKE 1 CAPSULE BY MOUTH AT BEDTIME AS NEEDED FOR SLEEP. 30 capsule 0   HYDROcodone -acetaminophen  (NORCO/VICODIN) 5-325 MG tablet Take 1 tablet by mouth every 6 (six) hours as needed. (Patient not taking: Reported on 02/04/2024) 6 tablet 0   metoCLOPramide  (REGLAN ) 10 MG tablet Take 1 tablet (10 mg total) by mouth every 6 (six) hours. (Patient not  taking: Reported on 02/04/2024) 30 tablet 0   nicotine  (NICODERM CQ  - DOSED IN MG/24 HOURS) 14 mg/24hr patch Place 1 patch (14 mg total) onto the skin daily at 6 (six) AM. (Patient not taking: Reported on 02/04/2024) 28 patch 0   ondansetron  (ZOFRAN -ODT) 4 MG disintegrating tablet Take 1 tablet (4 mg total) by mouth every 8 (eight) hours. 90 tablet 0   oxyCODONE -acetaminophen  (PERCOCET/ROXICET) 5-325 MG tablet Take 1-2 tablets by mouth every 6 (six) hours as needed for severe pain (pain score 7-10). (Patient not taking: Reported on 12/31/2023) 10 tablet 0   propranolol  (INDERAL ) 10 MG tablet Take 10 mg by mouth daily. (Patient not taking: Reported on 02/04/2024)     No current facility-administered medications for this visit.    Allergies as of 02/04/2024 - Review Complete 02/04/2024  Allergen Reaction Noted   Hydroxyzine  Palpitations and Other (See Comments) 06/07/2018   Nsaids Other (See Comments) 08/30/2023   Ambien  [zolpidem  tartrate] Other (See Comments) 06/07/2018   Ibuprofen  Other (See Comments) 02/16/2023    Family History  Problem Relation Age of Onset   Cancer Maternal Uncle        Lung   Cancer Maternal Uncle        Lung   Cancer Maternal Uncle  Lung   Headache Neg Hx        I don't think so   Migraines Neg Hx        I don't think so    Review of Systems:    Constitutional: No weight loss, fever, chills, weakness or fatigue HEENT: Eyes: No change in vision               Ears, Nose, Throat:  No change in hearing or congestion Skin: No rash or itching Cardiovascular: No chest pain, chest pressure or palpitations   Respiratory: No SOB or cough Gastrointestinal: See HPI and otherwise negative Genitourinary: No dysuria or change in urinary frequency Neurological: No headache, dizziness or syncope Musculoskeletal: No new muscle or joint pain Hematologic: No bleeding or bruising Psychiatric: No history of depression or anxiety    Physical Exam:  Vital  signs: BP (!) 140/88   Pulse 81   Ht 5' 4 (1.626 m)   Wt 167 lb 6 oz (75.9 kg)   SpO2 98%   BMI 28.73 kg/m   Constitutional:   Pleasant  female, alert and cooperative, holding abdomen whole visit Throat: Oral cavity and pharynx without inflammation, swelling or lesion.  Respiratory: Respirations even and unlabored. Lungs clear to auscultation bilaterally.   No wheezes, crackles, or rhonchi.  Cardiovascular: Normal S1, S2. Regular rate and rhythm. No peripheral edema, cyanosis or pallor.  Gastrointestinal:  Soft, nondistended, generalized abd tenderness with palpation. No rebound or guarding. Normal bowel sounds. No appreciable masses or hepatomegaly. Rectal: Declined Msk:  Symmetrical without gross deformities. Without edema, no deformity or joint abnormality.  Neurologic:  Alert and  oriented x4;  grossly normal neurologically.  Skin:   Dry and intact without significant lesions or rashes. Psychiatric: Oriented to person, place and time. Demonstrates good judgement and reason without abnormal affect or behaviors.  RELEVANT LABS AND IMAGING: CBC    Latest Ref Rng & Units 12/26/2023    5:40 AM 12/25/2023    5:39 AM 12/24/2023   12:21 PM  CBC  WBC 4.0 - 10.5 K/uL 8.4  10.6  13.8   Hemoglobin 12.0 - 15.0 g/dL 89.8  89.8  89.0   Hematocrit 36.0 - 46.0 % 31.2  30.9  32.5   Platelets 150 - 400 K/uL 385  401  431      CMP     Latest Ref Rng & Units 12/26/2023    5:40 AM 12/25/2023    5:39 AM 12/24/2023   12:21 PM  CMP  Glucose 70 - 99 mg/dL 94  88  886   BUN 6 - 20 mg/dL 11  <5  <5   Creatinine 0.44 - 1.00 mg/dL 8.82  8.96  8.97   Sodium 135 - 145 mmol/L 140  141  140   Potassium 3.5 - 5.1 mmol/L 3.8  3.7  4.3   Chloride 98 - 111 mmol/L 109  111  112   CO2 22 - 32 mmol/L 19  19  18    Calcium  8.9 - 10.3 mg/dL 8.6  8.6  8.7      Lab Results  Component Value Date   TSH 0.405 05/22/2023   08/30/23 CT ABD/pelvis IMPRESSION: 1. Distal colonic diverticulosis with no evidence  of acute diverticulitis. 2.  Aortic Atherosclerosis (ICD10-I70.0). 07/28/23 CT abd/pelvis MPRESSION: 1. Colonic diverticulosis with mild thickening of the mid sigmoid colon which Patrisia Faeth represent sequelae associated with mild diverticulitis. 2. Stable left adrenal adenoma. 3. Multiple stable bilateral simple renal cysts. No  follow-up imaging is recommended. 03/04/23 CT abd/pelvis IMPRESSION: *No acute inflammatory process identified within the abdomen or pelvis. *Multiple other nonacute observations, as described above.  01/21/2023 nuclear medicine gastric emptying study FINDINGS: Expected location of the stomach in the left upper quadrant. Ingested meal empties the stomach gradually over the course of the study with 31% retention at 60 min and 5% retention at 120 min (normal retention less than 30% at a 120 min). IMPRESSION: Normal gastric emptying study.   01/18/2023 CT abdomen pelvis with contrast IMPRESSION: 1. No acute finding in the abdomen or pelvis. 2. Colonic diverticulosis without evidence of acute diverticulitis.   10/21/2022 CT abdomen pelvis with contrast IMPRESSION: No bowel obstruction, free air or free fluid. Normal appendix. Colonic diverticula. Previous inflammatory changes along the distal descending colon are not clearly seen on the current examination. Previous cholecystectomy. Fatty liver infiltration.   10/20/2022 CT abdomen/pelvis with contrast IMPRESSION: 1. Improved appearance of diverticulitis at the junction of the descending and sigmoid colon, nearly resolved. No extraluminal gas or abscess. 2. Aortic atherosclerosis.  12/23/23 CTAP IMPRESSION: 1. No acute CT findings of the abdomen or pelvis to explain left lower quadrant abdominal pain. 2. Descending and sigmoid diverticulosis without evidence of acute diverticulitis at this time, previous inflammatory findings of the descending colon resolved. 3. Rectosigmoid colon resection and reanastomosis. 4.  Cholecystectomy and hysterectomy.  12/26/2023 EGD with Dr. Leigh - Esophagogastric landmarks identified. - 1 cm hiatal hernia. - Normal esophagus otherwise. - A single gastric polypoid lesion vs. prominent / inflamed fold in the antrum, small area of ulceration there and some erosions. Biopsied. - Normal stomach otherwise - biopsies taken to rule out H pylori. - Normal examined duodenum.  Overall, while gastric antral lesion noted, this has been seen in the past with negative biopsies - unclear if prominent fold vs. polypoid lesion, but it does not impinge on the pylorus which is widely patent. Biopsies taken to rule out H pylori. No obvious cause for symptoms otherwise. She has improved with reinitiation of PPI, standing Zofran , and Bentyl .  Bx: FINAL MICROSCOPIC DIAGNOSIS:   A. STOMACH, ANTRUM, POLYPECTOMY:       Gastric antral type mucosa with marked foveolar hyperplasia and  reactive epithelial changes.       No H. pylori identified on HE stain.       Negative for intestinal metaplasia or dysplasia.   B. STOMACH, BIOPSY:       Gastric antral / oxyntic type mucosa without significant diagnostic  alteration.      No H. pylori identified on HE stain.       Negative for intestinal metaplasia or dysplasia.    04/07/23 EGD with Dr. Avram Impression: - Enlarged gastric folds. Biopsied. Likely same thing described as a papule in 2024 - which was inflamed mucosa from prolapse - Scar in the gastric antrum. Biopsied. - The examination was otherwise normal.  04/07/23 colonoscopy with Dr. Avram Impression: - Diverticulosis in the sigmoid colon. - The examination was otherwise normal on direct and retroflexion views. - No specimens collected.   09/29/22 EGD - Z-line regular, 38 cm from the incisors. - No gross lesions in the entire esophagus. - Non-bleeding gastric ulcer with no stigmata of bleeding. - A single mucosal papule (nodule) found in the stomach. Biopsied. - Gastritis. Biopsied. -  Normal examined duodenum.   A. STOMACH, ANTRUM NODULE, BIOPSY:  - Polypoid gastric antral mucosa showing marked reactive gastropathy and  features of mucosal prolapse  - Negative for  intestinal metaplasia, dysplasia or malignancy   B. STOMACH, RANDOM, BIOPSY:  - Gastric antral and oxyntic mucosa with nonspecific reactive  gastropathy  - Helicobacter pylori-like organisms are not identified on routine HE  stain    Reported colonoscopy in 2018 with [facility]   Assessment/Plan: Encounter Diagnoses  Name Primary?   Nausea without vomiting Yes   History of diverticulitis    Constipation, unspecified constipation type    Poor appetite    Gastroesophageal reflux disease without esophagitis    PUD (peptic ulcer disease)    Generalized abdominal pain   Natalie Edwards is a 58 year old African-American female with history of hypertension, anxiety/depression, previous peptic ulcer disease, recurrent diverticulitis, and is status post cholecystectomy and hysterectomy.  Patient had undergone sigmoid colectomy in mid August 2025 due to recurrent diverticulitis.  She had a psych admission towards the end of August secondary to depression/suicidal ideation. She has had 3 recent ER visits on 10/3, 10/5, and then 12/23/2023 at which time she was admitted.  Each of these visits was with abdominal pain and nausea. CT scan on 12/18/2023 with contrast showed postop changes around the sigmoid colon with trace stranding in the descending colon consistent with mild diverticulitis.  She was started on antibiotics. She returned on 12/20/2023 with persistent symptoms and had repeat CT scan that showed some subtle improvement.  She was continued on Cipro  and Flagyl . On return to the ER on 12/23/2023 with repeat CT with contrast shows a normal-appearing stomach, no evidence of bowel wall thickening distention or inflammatory changes there is descending and sigmoid diverticulosis and an anastomosis in the rectosigmoid  no active diverticulitis status post cholecystectomy and hysterectomy. He has not been on any recent aspirin or NSAIDs. She endorses taking a PPI regularly at home.  Last colonoscopy January 2025 with finding of multiple diverticuli in the sigmoid colon otherwise unremarkable Last EGD 10/25 : Overall, while gastric antral lesion noted, this has been seen in the past with negative biopsies - unclear if prominent fold vs. polypoid lesion, but it does not impinge on the pylorus which is widely patent. Biopsies taken to rule out H pylori. No obvious cause for symptoms otherwise.  #1 Postprandial nausea #2 Weight loss -dietitian referral  -antiemetics as needed, refilled -recheck CBC, CMET #3 sigmoid colectomy in mid August 2025 due to recurrent diverticulitis.  #4  Abdominal pain -bentyl  as needed -follow-up with General surgery as scheduled #5 GERD #6 History of PUD with hemorrhage -Continue PPI therapy BID -No NSAIDs #7 BRB per rectum, suspect hemorrhoids, declined exam -topical ointment Recticare given #8 Anxiety # 9 MDD #10  Previous SI  - Patient with history of GAD, MDD, and previous SI. Had multiple medication changes and discontinuations during prior admissions  Thank you for the courtesy of this consult. Please call me with any questions or concerns.   Mallie Giambra, FNP-C [facility] 02/04/2024, 4:09 PM  Cc: Tanda Bleacher, MD

## 2024-02-04 NOTE — Telephone Encounter (Signed)
 See telephone encounter.

## 2024-02-04 NOTE — Patient Instructions (Addendum)
 Hemorrhoids Samples of topical recticare No straining  GERD Continue pantoprazole  40mg  twice daily No NSAIDs  Nausea Continue zofran  every 8 hours as needed  _______________________________________________________  If your blood pressure at your visit was 140/90 or greater, please contact your primary care physician to follow up on this.  _______________________________________________________  If you are age 59 or older, your body mass index should be between 23-30. Your Body mass index is 28.73 kg/m. If this is out of the aforementioned range listed, please consider follow up with your Primary Care Provider.  If you are age 72 or younger, your body mass index should be between 19-25. Your Body mass index is 28.73 kg/m. If this is out of the aformentioned range listed, please consider follow up with your Primary Care Provider.   ________________________________________________________  The [facility] providers would like to encourage you to use MYCHART to communicate with providers for non-urgent requests or questions.  Due to long hold times on the telephone, sending your provider a message by [facility] may be a faster and more efficient way to get a response.  Please allow 48 business hours for a response.  Please remember that this is for non-urgent requests.  _______________________________________________________  [facility] is using a team-based approach to care.  Your team is made up of your doctor and two to three APPS. Our APPS (Nurse Practitioners and Physician Assistants) work with your physician to ensure care continuity for you. They are fully qualified to address your health concerns and develop a treatment plan. They communicate directly with your gastroenterologist to care for you. Seeing the Advanced Practice Practitioners on your physician's team can help you by facilitating care more promptly, often allowing for earlier appointments, access to diagnostic  testing, procedures, and other specialty referrals.   Thank you for trusting me with your gastrointestinal care. Deanna May, FNP-C

## 2024-02-07 ENCOUNTER — Other Ambulatory Visit: Payer: Self-pay | Admitting: Family Medicine

## 2024-02-22 ENCOUNTER — Ambulatory Visit: Admitting: Family Medicine

## 2024-02-24 ENCOUNTER — Emergency Department (HOSPITAL_COMMUNITY)

## 2024-02-24 ENCOUNTER — Emergency Department (HOSPITAL_COMMUNITY)
Admission: EM | Admit: 2024-02-24 | Discharge: 2024-02-24 | Disposition: A | Discharge status: Home / Self Care | Attending: Emergency Medicine | Admitting: Emergency Medicine

## 2024-02-24 ENCOUNTER — Other Ambulatory Visit: Payer: Self-pay

## 2024-02-24 DIAGNOSIS — K5900 Constipation, unspecified: Secondary | ICD-10-CM | POA: Insufficient documentation

## 2024-02-24 DIAGNOSIS — I10 Essential (primary) hypertension: Secondary | ICD-10-CM | POA: Diagnosis not present

## 2024-02-24 DIAGNOSIS — R1084 Generalized abdominal pain: Secondary | ICD-10-CM

## 2024-02-24 DIAGNOSIS — Z79899 Other long term (current) drug therapy: Secondary | ICD-10-CM | POA: Insufficient documentation

## 2024-02-24 LAB — CBC WITH DIFFERENTIAL/PLATELET
Abs Immature Granulocytes: 0.01 K/uL (ref 0.00–0.07)
Basophils Absolute: 0 K/uL (ref 0.0–0.1)
Basophils Relative: 0 %
Eosinophils Absolute: 0.1 K/uL (ref 0.0–0.5)
Eosinophils Relative: 2 %
HCT: 37.7 % (ref 36.0–46.0)
Hemoglobin: 12.1 g/dL (ref 12.0–15.0)
Immature Granulocytes: 0 %
Lymphocytes Relative: 51 %
Lymphs Abs: 4 K/uL (ref 0.7–4.0)
MCH: 25.9 pg — ABNORMAL LOW (ref 26.0–34.0)
MCHC: 32.1 g/dL (ref 30.0–36.0)
MCV: 80.6 fL (ref 80.0–100.0)
Monocytes Absolute: 0.6 K/uL (ref 0.1–1.0)
Monocytes Relative: 7 %
Neutro Abs: 3.2 K/uL (ref 1.7–7.7)
Neutrophils Relative %: 40 %
Platelets: 510 K/uL — ABNORMAL HIGH (ref 150–400)
RBC: 4.68 MIL/uL (ref 3.87–5.11)
RDW: 19 % — ABNORMAL HIGH (ref 11.5–15.5)
WBC: 7.9 K/uL (ref 4.0–10.5)
nRBC: 0 % (ref 0.0–0.2)

## 2024-02-24 LAB — URINALYSIS, ROUTINE W REFLEX MICROSCOPIC
Bilirubin Urine: NEGATIVE
Glucose, UA: NEGATIVE mg/dL
Hgb urine dipstick: NEGATIVE
Ketones, ur: NEGATIVE mg/dL
Nitrite: NEGATIVE
Protein, ur: NEGATIVE mg/dL
Specific Gravity, Urine: 1.027 (ref 1.005–1.030)
pH: 6 (ref 5.0–8.0)

## 2024-02-24 LAB — COMPREHENSIVE METABOLIC PANEL WITH GFR
ALT: 9 U/L (ref 0–44)
AST: 19 U/L (ref 15–41)
Albumin: 4.2 g/dL (ref 3.5–5.0)
Alkaline Phosphatase: 72 U/L (ref 38–126)
Anion gap: 10 (ref 5–15)
BUN: 12 mg/dL (ref 6–20)
CO2: 25 mmol/L (ref 22–32)
Calcium: 9.5 mg/dL (ref 8.9–10.3)
Chloride: 104 mmol/L (ref 98–111)
Creatinine, Ser: 1.15 mg/dL — ABNORMAL HIGH (ref 0.44–1.00)
GFR, Estimated: 55 mL/min — ABNORMAL LOW (ref >60)
Glucose, Bld: 93 mg/dL (ref 70–99)
Potassium: 4.3 mmol/L (ref 3.5–5.1)
Sodium: 139 mmol/L (ref 135–145)
Total Bilirubin: <0.2 mg/dL (ref 0.0–1.2)
Total Protein: 7.5 g/dL (ref 6.5–8.1)

## 2024-02-24 LAB — I-STAT CG4 LACTIC ACID, ED: Lactic Acid, Venous: 1 mmol/L (ref 0.5–1.9)

## 2024-02-24 MED ORDER — ONDANSETRON HCL 4 MG/2ML IJ SOLN
4.0000 mg | Freq: Once | INTRAMUSCULAR | Status: AC
  Administered 2024-02-24: 4 mg via INTRAVENOUS
  Filled 2024-02-24: qty 2

## 2024-02-24 MED ORDER — OXYCODONE-ACETAMINOPHEN 5-325 MG PO TABS: 1.0000 | ORAL_TABLET | Freq: Three times a day (TID) | ORAL | 0 refills | Status: DC | PRN

## 2024-02-24 MED ORDER — IOHEXOL 300 MG/ML  SOLN
100.0000 mL | Freq: Once | INTRAMUSCULAR | Status: AC | PRN
  Administered 2024-02-24: 100 mL via INTRAVENOUS

## 2024-02-24 MED ORDER — HYDROMORPHONE HCL 1 MG/ML IJ SOLN
1.0000 mg | Freq: Once | INTRAMUSCULAR | Status: AC
  Administered 2024-02-24: 1 mg via INTRAVENOUS
  Filled 2024-02-24: qty 1

## 2024-02-24 MED ORDER — POLYETHYLENE GLYCOL 3350 17 GM/SCOOP PO POWD: 17.0000 g | Freq: Every day | ORAL | 0 refills | Status: AC

## 2024-02-24 MED ORDER — FENTANYL CITRATE (PF) 50 MCG/ML IJ SOSY
50.0000 ug | PREFILLED_SYRINGE | Freq: Once | INTRAMUSCULAR | Status: AC
  Administered 2024-02-24: 50 ug via INTRAVENOUS
  Filled 2024-02-24: qty 1

## 2024-02-24 MED ORDER — LACTATED RINGERS IV BOLUS
1000.0000 mL | Freq: Once | INTRAVENOUS | Status: AC
  Administered 2024-02-24: 1000 mL via INTRAVENOUS

## 2024-02-24 MED ORDER — HYDROMORPHONE HCL 1 MG/ML IJ SOLN: 1.0000 mg | Freq: Once | INTRAMUSCULAR | Status: DC

## 2024-02-24 MED ORDER — FENTANYL CITRATE (PF) 50 MCG/ML IJ SOSY
50.0000 ug | PREFILLED_SYRINGE | Freq: Once | INTRAMUSCULAR | Status: DC
  Filled 2024-02-24: qty 1

## 2024-02-24 NOTE — ED Triage Notes (Signed)
 Pt came in via EMS w/ c/o of abd pain since Sunday. Tried zofran  and tylenol  with no relief. Endorses nausea, denies vomiting. No BM Sunday. Endorses painful urination. Hx od diverticulitis.

## 2024-02-24 NOTE — ED Provider Notes (Signed)
 [place] EMERGENCY DEPARTMENT AT [facility] Provider Note   CSN: [id number] Arrival date & time: 02/24/24  9352     Patient presents with: Abdominal Pain   Natalie Edwards is a 58 y.o. female.    Abdominal Pain Associated symptoms: nausea      58 year old female with medical history significant for diverticulitis status post sigmoid colectomy in August 2025, HTN, iron  deficiency anemia secondary to chronic blood loss, PUD with hemorrhage, GERD, GAD, MDD, cocaine and alcohol abuse who presents to the emergency department with a chief complaint of abdominal pain.  The patient states that she has had abdominal pain since this past Sunday.  She is having associated nausea.  Her last bowel movement was 2 days ago and she was passing only just small little balls.  She endorses painful urination and significant pain in her abdomen with some focality in the left lower quadrant.  Prior to Admission medications   Medication Sig Start Date End Date Taking? Authorizing Provider  polyethylene glycol powder (GLYCOLAX /MIRALAX ) 17 GM/SCOOP powder Take 17 g by mouth daily. Dissolve 1 capful (17g) in 4-8 ounces of liquid and take by mouth daily. 02/24/24  Yes Jerrol Agent, MD  dicyclomine  (BENTYL ) 10 MG capsule Take 1 capsule (10 mg total) by mouth 3 (three) times daily before meals. 12/26/23   Al-Sultani, Anmar, MD  FLUoxetine  (PROZAC ) 40 MG capsule Take 1 capsule (40 mg total) by mouth daily. Patient taking differently: Take 40 mg by mouth 2 (two) times daily. 11/10/23   Donnelly Mellow, MD  hydrochlorothiazide  (HYDRODIURIL ) 25 MG tablet Take 25 mg by mouth daily. 11/20/23   [provider]  HYDROcodone -acetaminophen  (NORCO/VICODIN) 5-325 MG tablet Take 1 tablet by mouth every 6 (six) hours as needed. Patient not taking: Reported on 02/04/2024 12/20/23   Lorelle Aleck BROCKS, PA-C  metoCLOPramide  (REGLAN ) 10 MG tablet Take 1 tablet (10 mg total) by mouth every 6 (six)  hours. Patient not taking: Reported on 02/04/2024 12/20/23   Aberman, Caroline C, PA-C  mirtazapine  (REMERON ) 45 MG tablet TAKE 1 TABLET BY MOUTH AT  BEDTIME 02/08/24   Tanda Bleacher, MD  nicotine  (NICODERM CQ  - DOSED IN MG/24 HOURS) 14 mg/24hr patch Place 1 patch (14 mg total) onto the skin daily at 6 (six) AM. Patient not taking: Reported on 02/04/2024 11/10/23   Donnelly Mellow, MD  ondansetron  (ZOFRAN -ODT) 4 MG disintegrating tablet Take 1 tablet (4 mg total) by mouth every 8 (eight) hours. 02/04/24   May, Deanna J, NP  oxyCODONE -acetaminophen  (PERCOCET/ROXICET) 5-325 MG tablet Take 1-2 tablets by mouth every 6 (six) hours as needed for severe pain (pain score 7-10). Patient not taking: Reported on 12/31/2023 12/18/23   Vicky Charleston, PA-C  pantoprazole  (PROTONIX ) 40 MG tablet Take 1 tablet (40 mg total) by mouth 2 (two) times daily. 12/26/23   Al-Sultani, Anmar, MD  propranolol  (INDERAL ) 10 MG tablet Take 10 mg by mouth daily. Patient not taking: Reported on 02/04/2024 11/10/23   [provider]  sucralfate  (CARAFATE ) 1 GM/10ML suspension Take 10 mLs (1 g total) by mouth 4 (four) times daily -  with meals and at bedtime. 12/26/23   Al-Sultani, Anmar, MD  temazepam  (RESTORIL ) 15 MG capsule TAKE 1 CAPSULE BY MOUTH AT BEDTIME AS NEEDED FOR SLEEP. 01/27/24   Neda Hammond A, MD    Allergies: Hydroxyzine , Nsaids, Ambien  [zolpidem  tartrate], and Ibuprofen     Review of Systems  Gastrointestinal:  Positive for abdominal pain and nausea.  All other systems reviewed and are  negative.   Updated Vital Signs BP (!) 177/76   Pulse (!) 58   Temp 98 F (36.7 C) (Oral)   Resp 14   SpO2 97%   Physical Exam Vitals and nursing note reviewed.  Constitutional:      General: She is not in acute distress.    Appearance: She is well-developed.  HENT:     Head: Normocephalic and atraumatic.  Eyes:     Conjunctiva/sclera: Conjunctivae normal.  Cardiovascular:     Rate and Rhythm: Normal  rate and regular rhythm.     Heart sounds: No murmur heard. Pulmonary:     Effort: Pulmonary effort is normal. No respiratory distress.     Breath sounds: Normal breath sounds.  Abdominal:     Palpations: Abdomen is soft.     Tenderness: There is generalized abdominal tenderness and tenderness in the left lower quadrant. There is guarding.  Musculoskeletal:        General: No swelling.     Cervical back: Neck supple.  Skin:    General: Skin is warm and dry.     Capillary Refill: Capillary refill takes less than 2 seconds.  Neurological:     Mental Status: She is alert.  Psychiatric:        Mood and Affect: Mood normal.     (all labs ordered are listed, but only abnormal results are displayed) Labs Reviewed  COMPREHENSIVE METABOLIC PANEL WITH GFR - Abnormal; Notable for the following components:      Result Value   Creatinine, Ser 1.15 (*)    GFR, Estimated 55 (*)    All other components within normal limits  CBC WITH DIFFERENTIAL/PLATELET - Abnormal; Notable for the following components:   MCH 25.9 (*)    RDW 19.0 (*)    Platelets 510 (*)    All other components within normal limits  URINALYSIS, ROUTINE W REFLEX MICROSCOPIC - Abnormal; Notable for the following components:   Color, Urine STRAW (*)    Leukocytes,Ua TRACE (*)    Bacteria, UA RARE (*)    All other components within normal limits  I-STAT CG4 LACTIC ACID, ED    EKG: EKG Interpretation Date/Time:  Wednesday February 24 2024 06:57:03 EST Ventricular Rate:  71 PR Interval:  176 QRS Duration:  87 QT Interval:  400 QTC Calculation: 435 R Axis:   49  Text Interpretation: Sinus rhythm Borderline low voltage, extremity leads Confirmed by Palumbo, April ([id number]) on 02/24/2024 6:59:47 AM  Radiology: CT ABDOMEN PELVIS W CONTRAST Result Date: 02/24/2024 EXAM: CT ABDOMEN AND PELVIS WITH CONTRAST 02/24/2024 09:07:17 AM TECHNIQUE: CT of the abdomen and pelvis was performed with the administration of intravenous  contrast. 100 mL (iohexol  (OMNIPAQUE ) 300 MG/ML solution 100 mL IOHEXOL  300 MG/ML SOLN) was administered. Multiplanar reformatted images are provided for review. Automated exposure control, iterative reconstruction, and/or weight-based adjustment of the mA/kV was utilized to reduce the radiation dose to as low as reasonably achievable. COMPARISON: CT 12/23/2023 and previous. CLINICAL HISTORY: Diverticulitis, complication suspected; LLQ abdominal pain. FINDINGS: LOWER CHEST: Small pericardial effusion, stable. LIVER: The liver is unremarkable. GALLBLADDER AND BILE DUCTS: Cholecystectomy clips. Stable ectatic CBD. SPLEEN: No acute abnormality. PANCREAS: No acute abnormality. ADRENAL GLANDS: 1.6 cm left adrenal nodule stable since 07/31/2016, consistent with benign process. KIDNEYS, URETERS AND BLADDER: Multiple bilateral renal cortical cysts, present since 07/31/2016. Per consensus, no follow-up is needed for simple Bosniak type 1 and 2 renal cysts, unless the patient has a malignancy history or risk factors. No  stones in the kidneys or ureters. No hydronephrosis. No perinephric or periureteral stranding. Urinary bladder is unremarkable. GI AND BOWEL: Stomach demonstrates no acute abnormality. There is no bowel obstruction. Scattered diverticula in the distal descending and proximal sigmoid colon without adjacent inflammatory change. Anastomotic staple line Near the rectosigmoid junction. PERITONEUM AND RETROPERITONEUM: No ascites. No free air. VASCULATURE: Moderate scattered partially calcified aortoiliac plaque without aneurysm. LYMPH NODES: No lymphadenopathy. REPRODUCTIVE ORGANS: Hysterectomy. BONES AND SOFT TISSUES: Mild lumbar facet degenerative joint disease L4-S1, right greater than left. Early bilateral hip degenerative joint disease (DJD). No acute osseous abnormality. No focal soft tissue abnormality. IMPRESSION: 1. No acute findings. 2. Colonic diverticulosis of the distal descending and proximal sigmoid  colon without inflammatory change. Electronically signed by: Dayne Hassell MD 02/24/2024 09:45 AM EST RP Workstation: [id number]     Procedures   Medications Ordered in the ED  fentaNYL  (SUBLIMAZE ) injection 50 mcg (50 mcg Intravenous Given 02/24/24 0847)  ondansetron  (ZOFRAN ) injection 4 mg (4 mg Intravenous Given 02/24/24 0847)  lactated ringers  bolus 1,000 mL (0 mLs Intravenous Stopped 02/24/24 1038)  iohexol  (OMNIPAQUE ) 300 MG/ML solution 100 mL (100 mLs Intravenous Contrast Given 02/24/24 0855)  HYDROmorphone  (DILAUDID ) injection 1 mg (1 mg Intravenous Given 02/24/24 1135)                                    Medical Decision Making Amount and/or Complexity of Data Reviewed Labs: ordered. Radiology: ordered.  Risk Prescription drug management.    58 year old female with medical history significant for diverticulitis status post sigmoid colectomy in August 2025, HTN, iron  deficiency anemia secondary to chronic blood loss, PUD with hemorrhage, GERD, GAD, MDD, cocaine and alcohol abuse who presents to the emergency department with a chief complaint of abdominal pain.  The patient states that she has had abdominal pain since this past Sunday.  She is having associated nausea.  Her last bowel movement was 2 days ago and she was passing only just small little balls.  She endorses painful urination and significant pain in her abdomen with some focality in the left lower quadrant.  On arrival, the patient was afebrile, not tachycardic, hypertensive in the setting of pain, BP 154/108, respiratory rate 18, saturating 97% on room air.  Patient on exam had generalized abdominal tenderness with some focality in the left lower quadrant.  Given the patient's history of diverticulitis, suspect recurrence of same, also considered bowel obstruction, constipation, other acute intra-abdominal infectious etiology or genitourinary infectious etiology.  The access was obtained and the patient was  administered IV fluid bolus for all resuscitation, IV fentanyl  and IV Zofran .  Labs: CBC without a leukocytosis or anemia, CMP collected and generally unremarkable, urinalysis pending.  Will obtain CT of the abdomen pelvis to further evaluate.  CT abdomen pelvis:  IMPRESSION:  1. No acute findings.  2. Colonic diverticulosis of the distal descending and proximal sigmoid colon  without inflammatory change.    Patient with persistent pain on repeat assessment, administered Dilaudid  1 mg IV.  Urinalysis was obtained which was unconvincing for urinary tract infection, negative nitrites only trace leukocytes, 0-5 WBCs, rare bacteria present.  On further assessment, the patient was overall well-appearing and feeling symptomatically improved.  Will discharge the patient on a short course of opiate pain medication.  Symptoms could be due to mild constipation, recommended over-the-counter bowel regimen.  Patient overall stable for discharge, advised outpatient PCP follow-up, return  precautions provided for any severe worsening symptoms.     Final diagnoses:  Generalized abdominal pain  Constipation, unspecified constipation type    ED Discharge Orders          Ordered    polyethylene glycol powder (GLYCOLAX /MIRALAX ) 17 GM/SCOOP powder  Daily        02/24/24 1243               Jerrol Agent, MD 02/24/24 1244

## 2024-02-24 NOTE — Discharge Instructions (Addendum)
 Your evaluation to include laboratory evaluation and imaging evaluation was overall reassuring.  Please follow-up with your primary care provider for continued outpatient management, return for any severe worsening abdominal pain.  Symptoms could be due to mild constipation, recommend an over-the-counter bowel regimen.  Can try 4 capfuls of MiraLAX  and a 32 ounce Gatorade, drink over 4-hour period, can repeat the following day for a satisfactory bowel movement

## 2024-02-25 ENCOUNTER — Ambulatory Visit: Admitting: Pulmonary Disease

## 2024-02-25 ENCOUNTER — Encounter: Payer: Self-pay | Admitting: Pulmonary Disease

## 2024-03-01 ENCOUNTER — Encounter: Payer: Self-pay | Admitting: Pulmonary Disease

## 2024-03-01 ENCOUNTER — Other Ambulatory Visit: Payer: Self-pay | Admitting: Pulmonary Disease

## 2024-03-01 MED ORDER — TEMAZEPAM 15 MG PO CAPS: 15.0000 mg | ORAL_CAPSULE | Freq: Every day | ORAL | 1 refills | Status: DC

## 2024-03-31 ENCOUNTER — Other Ambulatory Visit: Payer: Self-pay

## 2024-03-31 ENCOUNTER — Ambulatory Visit: Admitting: Pulmonary Disease

## 2024-03-31 ENCOUNTER — Encounter (HOSPITAL_COMMUNITY): Payer: Self-pay | Admitting: Emergency Medicine

## 2024-03-31 ENCOUNTER — Encounter: Payer: Self-pay | Admitting: Pulmonary Disease

## 2024-03-31 ENCOUNTER — Emergency Department (HOSPITAL_COMMUNITY)

## 2024-03-31 ENCOUNTER — Emergency Department (HOSPITAL_COMMUNITY)
Admission: EM | Admit: 2024-03-31 | Discharge: 2024-03-31 | Disposition: A | Discharge status: Home / Self Care | Attending: Emergency Medicine | Admitting: Emergency Medicine

## 2024-03-31 DIAGNOSIS — R109 Unspecified abdominal pain: Secondary | ICD-10-CM

## 2024-03-31 DIAGNOSIS — I10 Essential (primary) hypertension: Secondary | ICD-10-CM | POA: Insufficient documentation

## 2024-03-31 DIAGNOSIS — K529 Noninfective gastroenteritis and colitis, unspecified: Secondary | ICD-10-CM | POA: Insufficient documentation

## 2024-03-31 DIAGNOSIS — R1030 Lower abdominal pain, unspecified: Secondary | ICD-10-CM | POA: Diagnosis present

## 2024-03-31 LAB — CBC
HCT: 38.6 % (ref 36.0–46.0)
Hemoglobin: 12.8 g/dL (ref 12.0–15.0)
MCH: 27.3 pg (ref 26.0–34.0)
MCHC: 33.2 g/dL (ref 30.0–36.0)
MCV: 82.3 fL (ref 80.0–100.0)
Platelets: 546 K/uL — ABNORMAL HIGH (ref 150–400)
RBC: 4.69 MIL/uL (ref 3.87–5.11)
RDW: 21.7 % — ABNORMAL HIGH (ref 11.5–15.5)
WBC: 13.7 K/uL — ABNORMAL HIGH (ref 4.0–10.5)
nRBC: 0 % (ref 0.0–0.2)

## 2024-03-31 LAB — COMPREHENSIVE METABOLIC PANEL WITH GFR
ALT: 17 U/L (ref 0–44)
AST: 18 U/L (ref 15–41)
Albumin: 4.5 g/dL (ref 3.5–5.0)
Alkaline Phosphatase: 76 U/L (ref 38–126)
Anion gap: 15 (ref 5–15)
BUN: 13 mg/dL (ref 6–20)
CO2: 18 mmol/L — ABNORMAL LOW (ref 22–32)
Calcium: 9.6 mg/dL (ref 8.9–10.3)
Chloride: 109 mmol/L (ref 98–111)
Creatinine, Ser: 1.69 mg/dL — ABNORMAL HIGH (ref 0.44–1.00)
GFR, Estimated: 34 mL/min — ABNORMAL LOW
Glucose, Bld: 103 mg/dL — ABNORMAL HIGH (ref 70–99)
Potassium: 3.1 mmol/L — ABNORMAL LOW (ref 3.5–5.1)
Sodium: 142 mmol/L (ref 135–145)
Total Bilirubin: 0.3 mg/dL (ref 0.0–1.2)
Total Protein: 8 g/dL (ref 6.5–8.1)

## 2024-03-31 LAB — URINALYSIS, ROUTINE W REFLEX MICROSCOPIC
Bilirubin Urine: NEGATIVE
Glucose, UA: NEGATIVE mg/dL
Hgb urine dipstick: NEGATIVE
Ketones, ur: NEGATIVE mg/dL
Leukocytes,Ua: NEGATIVE
Nitrite: NEGATIVE
Protein, ur: 100 mg/dL — AB
Specific Gravity, Urine: 1.028 (ref 1.005–1.030)
pH: 5 (ref 5.0–8.0)

## 2024-03-31 LAB — LIPASE, BLOOD: Lipase: 15 U/L (ref 11–51)

## 2024-03-31 MED ORDER — LACTATED RINGERS IV BOLUS
1000.0000 mL | Freq: Once | INTRAVENOUS | Status: AC
  Administered 2024-03-31: 1000 mL via INTRAVENOUS

## 2024-03-31 MED ORDER — PANTOPRAZOLE SODIUM 40 MG IV SOLR
40.0000 mg | Freq: Once | INTRAVENOUS | Status: AC
  Administered 2024-03-31: 40 mg via INTRAVENOUS
  Filled 2024-03-31: qty 10

## 2024-03-31 MED ORDER — MORPHINE SULFATE (PF) 4 MG/ML IV SOLN
4.0000 mg | Freq: Once | INTRAVENOUS | Status: AC
  Administered 2024-03-31: 4 mg via INTRAVENOUS
  Filled 2024-03-31: qty 1

## 2024-03-31 MED ORDER — FENTANYL CITRATE (PF) 50 MCG/ML IJ SOSY
50.0000 ug | PREFILLED_SYRINGE | INTRAMUSCULAR | Status: DC | PRN
  Administered 2024-03-31: 50 ug via INTRAVENOUS
  Filled 2024-03-31: qty 1

## 2024-03-31 MED ORDER — IOHEXOL 350 MG/ML SOLN
75.0000 mL | Freq: Once | INTRAVENOUS | Status: AC | PRN
  Administered 2024-03-31: 75 mL via INTRAVENOUS

## 2024-03-31 MED ORDER — ONDANSETRON HCL 4 MG/2ML IJ SOLN
4.0000 mg | Freq: Once | INTRAMUSCULAR | Status: AC
  Administered 2024-03-31: 4 mg via INTRAVENOUS
  Filled 2024-03-31: qty 2

## 2024-03-31 MED ORDER — ALUM & MAG HYDROXIDE-SIMETH 200-200-20 MG/5ML PO SUSP
30.0000 mL | Freq: Once | ORAL | Status: AC
  Administered 2024-03-31: 30 mL via ORAL
  Filled 2024-03-31: qty 30

## 2024-03-31 MED ORDER — OXYCODONE HCL 5 MG PO TABS: 5.0000 mg | ORAL_TABLET | Freq: Four times a day (QID) | ORAL | 0 refills | Status: DC | PRN

## 2024-03-31 MED ORDER — FAMOTIDINE 20 MG PO TABS
20.0000 mg | ORAL_TABLET | Freq: Once | ORAL | Status: AC
  Administered 2024-03-31: 20 mg via ORAL
  Filled 2024-03-31: qty 1

## 2024-03-31 MED ORDER — ONDANSETRON HCL 4 MG PO TABS: 4.0000 mg | ORAL_TABLET | Freq: Four times a day (QID) | ORAL | 0 refills | Status: AC

## 2024-03-31 NOTE — ED Triage Notes (Signed)
 [place] arrives via [facility] for sharp lower abdominal pain. Reports the pain is similar to previous diverticulitis flare ups. 4mg  zofran  and 50mcg fentanyl  given en route with relief.

## 2024-03-31 NOTE — ED Provider Notes (Signed)
 " [place] EMERGENCY DEPARTMENT AT [facility] Provider Note   CSN: [id number] Arrival date & time: 03/31/24  0321     History Chief Complaint  Patient presents with   Abdominal Pain    HPI 59 year old female with medical history significant for diverticulitis status post sigmoid colectomy in August 2025, HTN, iron  deficiency anemia secondary to chronic blood loss, PUD with hemorrhage, GERD, GAD, MDD, cocaine and alcohol abuse who presents to the emergency department with a chief complaint of abdominal pain.   Patient's recorded medical, surgical, social, medication list and allergies were reviewed in the Snapshot window as part of the initial history.   Review of Systems   Review of Systems  Constitutional:  Negative for chills and fever.  HENT:  Negative for ear pain and sore throat.   Eyes:  Negative for pain and visual disturbance.  Respiratory:  Negative for cough and shortness of breath.   Cardiovascular:  Negative for chest pain and palpitations.  Gastrointestinal:  Positive for abdominal pain and diarrhea. Negative for vomiting.  Genitourinary:  Negative for dysuria and hematuria.  Musculoskeletal:  Negative for arthralgias and back pain.  Skin:  Negative for color change and rash.  Neurological:  Negative for seizures and syncope.  All other systems reviewed and are negative.   Physical Exam Updated Vital Signs BP (!) 161/99 (BP Location: Right Arm)   Pulse (!) 101   Temp 98.2 F (36.8 C) (Oral)   Resp 20   Ht 5' 4 (1.626 m)   Wt 75.9 kg   SpO2 100%   BMI 28.72 kg/m  Physical Exam Vitals and nursing note reviewed.  Constitutional:      General: She is not in acute distress.    Appearance: She is well-developed.  HENT:     Head: Normocephalic and atraumatic.  Eyes:     Conjunctiva/sclera: Conjunctivae normal.  Cardiovascular:     Rate and Rhythm: Normal rate and regular rhythm.     Heart sounds: No murmur heard. Pulmonary:     Effort:  Pulmonary effort is normal. No respiratory distress.     Breath sounds: Normal breath sounds.  Abdominal:     Palpations: Abdomen is soft.     Tenderness: There is no abdominal tenderness.  Musculoskeletal:        General: No swelling.     Cervical back: Neck supple.  Skin:    General: Skin is warm and dry.     Capillary Refill: Capillary refill takes less than 2 seconds.  Neurological:     Mental Status: She is alert.  Psychiatric:        Mood and Affect: Mood normal.      ED Course/ Medical Decision Making/ A&P    Procedures Procedures   Medications Ordered in ED Medications  fentaNYL  (SUBLIMAZE ) injection 50 mcg (50 mcg Intravenous Given 03/31/24 0449)  lactated ringers  bolus 1,000 mL (1,000 mLs Intravenous New Bag/Given 03/31/24 0526)  ondansetron  (ZOFRAN ) injection 4 mg (4 mg Intravenous Given 03/31/24 0448)  pantoprazole  (PROTONIX ) injection 40 mg (40 mg Intravenous Given 03/31/24 0449)  alum & mag hydroxide-simeth (MAALOX/MYLANTA) 200-200-20 MG/5ML suspension 30 mL (30 mLs Oral Given 03/31/24 0447)  famotidine  (PEPCID ) tablet 20 mg (20 mg Oral Given 03/31/24 0445)  iohexol  (OMNIPAQUE ) 350 MG/ML injection 75 mL (75 mLs Intravenous Contrast Given 03/31/24 0510)  morphine  (PF) 4 MG/ML injection 4 mg (4 mg Intravenous Given 03/31/24 0558)   Medical Decision Making:   Laramie Gelles is a 59  y.o. female who presented to the ED today with abdominal pain, detailed above.    Complete initial physical exam performed, notably the patient  was HDS in no acute distress.     Reviewed and confirmed nursing documentation for past medical history, family history, social history.    Initial Assessment:   With the patient's presentation of abdominal pain, most likely diagnosis is nonspecific etiology. Other diagnoses were considered including (but not limited to) gastroenteritis, colitis, small bowel obstruction, appendicitis, cholecystitis, pancreatitis, nephrolithiasis, UTI, pyleonephritis.  These are considered less likely due to history of present illness and physical exam findings.   This is most consistent with an acute life/limb threatening illness complicated by underlying chronic conditions.   Initial Plan:  CBC/CMP to evaluate for underlying infectious/metabolic etiology for patient's abdominal pain  Lipase to evaluate for pancreatitis  CTAB/Pelvis with contrast to evaluate for structural/surgical etiology of patients' severe abdominal pain.  Urinalysis and repeat physical assessment to evaluate for UTI/Pyelonpehritis  Empiric management of symptoms with escalating pain control and antiemetics as needed.   Initial Study Results:   Laboratory  All laboratory results reviewed without evidence of clinically relevant pathology.   Exceptions include: Mild WBC elevation    Radiology All images reviewed independently. Agree with radiology report at this time.   CT ABDOMEN PELVIS W CONTRAST Result Date: 03/31/2024 EXAM: CT ABDOMEN AND PELVIS WITH CONTRAST 03/31/2024 05:10:01 AM TECHNIQUE: CT of the abdomen and pelvis was performed with the administration of 75 mL of iohexol  (OMNIPAQUE ) 350 MG/ML injection. Multiplanar reformatted images are provided for review. Automated exposure control, iterative reconstruction, and/or weight-based adjustment of the mA/kV was utilized to reduce the radiation dose to as low as reasonably achievable. COMPARISON: 02/24/2024 CLINICAL HISTORY: Abdominal pain, acute, nonlocalized. FINDINGS: LOWER CHEST: The lung bases are clear. LIVER: Mild intrahepatic bile duct dilatation is unchanged. GALLBLADDER AND BILE DUCTS: Status post cholecystectomy with fusiform dilatation of the common bile duct. The common bile duct measures up to 1.3 cm, unchanged from prior exam. No calcified stones identified along the course of the common bile duct. SPLEEN: The spleen is within normal limits in size and appearance. PANCREAS: The pancreas is normal in size and contour  without focal lesion or ductal dilatation. ADRENAL GLANDS: Normal right adrenal gland. Left adrenal nodule measures 1.7 cm, unchanged comparison: 07/31/2016, and is compatible with a benign adenoma. No follow-up imaging recommended. KIDNEYS, URETERS AND BLADDER: Multiple, bilateral Bosniak class 1 kidney cysts. The largest arises within the upper pole of the left kidney, measuring 3.8 cm. Per consensus, no follow-up is needed for simple Bosniak type 1 and 2 renal cysts, unless the patient has a malignancy history or risk factors. No stones in the kidneys or ureters. No hydronephrosis. No perinephric or periureteral stranding. Urinary bladder is unremarkable. GI AND BOWEL: Stomach demonstrates no acute abnormality. The appendix is visualized and appears normal. Colonic diverticulosis without signs of acute diverticulitis. Mild wall thickening involving the descending colon is identified without surrounding inflammatory changes. This may reflect incomplete distention, though uncomplicated colitis would be difficult to exclude. Anastomotic suture line near the rectosigmoid junction is again noted. There is no bowel obstruction. PERITONEUM AND RETROPERITONEUM: No free fluid or fluid collections. No free air. VASCULATURE: Aorta is normal in caliber. Aortic atherosclerosis. LYMPH NODES: No lymphadenopathy. REPRODUCTIVE ORGANS: Status post hysterectomy. No adnexal mass. BONES AND SOFT TISSUES: No acute osseous abnormality. No focal soft tissue abnormality. IMPRESSION: 1. Mild wall thickening involving the descending colon without surrounding inflammatory changes, likely  reflects incomplete distention versus mild uncomplicated colitis (infectious or inflammatory). Electronically signed by: Waddell Calk MD 03/31/2024 05:57 AM EST RP Workstation: [id number]     Final Reassessment and Plan:   Patient's history of present illness physical exam findings are most consistent with colitis identified on her CT scan.  Very mild  case.  No evidence of systemic infection.  No indication for aggressive intervention at this time.  Symptomatic care.  Recommend follow-up with PCP aggressive hydration at home.  Disposition:  I have considered need for hospitalization, however, considering all of the above, I believe this patient is stable for discharge at this time.  Patient/family educated about specific return precautions for given chief complaint and symptoms.  Patient/family educated about follow-up with PCP.     Patient/family expressed understanding of return precautions and need for follow-up. Patient spoken to regarding all imaging and laboratory results and appropriate follow up for these results. All education provided in verbal form with additional information in written form. Time was allowed for answering of patient questions. Patient discharged.    Emergency Department Medication Summary:   Medications  fentaNYL  (SUBLIMAZE ) injection 50 mcg (50 mcg Intravenous Given 03/31/24 0449)  lactated ringers  bolus 1,000 mL (1,000 mLs Intravenous New Bag/Given 03/31/24 0526)  ondansetron  (ZOFRAN ) injection 4 mg (4 mg Intravenous Given 03/31/24 0448)  pantoprazole  (PROTONIX ) injection 40 mg (40 mg Intravenous Given 03/31/24 0449)  alum & mag hydroxide-simeth (MAALOX/MYLANTA) 200-200-20 MG/5ML suspension 30 mL (30 mLs Oral Given 03/31/24 0447)  famotidine  (PEPCID ) tablet 20 mg (20 mg Oral Given 03/31/24 0445)  iohexol  (OMNIPAQUE ) 350 MG/ML injection 75 mL (75 mLs Intravenous Contrast Given 03/31/24 0510)  morphine  (PF) 4 MG/ML injection 4 mg (4 mg Intravenous Given 03/31/24 0558)      Clinical Impression:  1. Abdominal pain, unspecified abdominal location   2. Colitis      Data Unavailable   Final Clinical Impression(s) / ED Diagnoses Final diagnoses:  Abdominal pain, unspecified abdominal location  Colitis    Rx / DC Orders ED Discharge Orders          Ordered    ondansetron  (ZOFRAN ) 4 MG tablet  Every 6 hours         03/31/24 0635    oxyCODONE  (ROXICODONE ) 5 MG immediate release tablet  Every 6 hours PRN        03/31/24 9364              Jerral Meth, MD 03/31/24 9364  "

## 2024-04-01 ENCOUNTER — Telehealth: Payer: Self-pay | Admitting: Family Medicine

## 2024-04-01 NOTE — Telephone Encounter (Signed)
 Copied from CRM [id number]. Topic: Referral - Request for Referral >> Apr 01, 2024  2:55 PM Kevelyn M wrote: Did the patient discuss referral with their provider in the last year? No Melinda with [facility] requesting for a referral to be completed in the [facility] system for the patient's appointment on 04/08/2024. Patient is having complications from surgery and need to be seen by the surgeon.  Call back # [phone]  Appointment offered? No  Type of order/referral and detailed reason for visit: K57.32 diverticulitis  Preference of office, provider, location: General surgery [facility], [id number]  If referral order, have you been seen by this specialty before? Yes (If Yes, this issue or another issue? When? Where?  Can we respond through MyChart? No

## 2024-04-05 ENCOUNTER — Other Ambulatory Visit: Payer: Self-pay | Admitting: Family Medicine

## 2024-04-05 DIAGNOSIS — R109 Unspecified abdominal pain: Secondary | ICD-10-CM

## 2024-04-06 NOTE — Telephone Encounter (Unsigned)
 Copied from CRM [id number]. Topic: Referral - Request for Referral >> Apr 01, 2024  2:55 PM Kevelyn M wrote: Did the patient discuss referral with their provider in the last year? No Melinda with [facility] requesting for a referral to be completed in the [facility] system for the patient's appointment on 04/08/2024. Patient is having complications from surgery and need to be seen by the surgeon.  Call back # [phone]  Appointment offered? No  Type of order/referral and detailed reason for visit: K57.32 diverticulitis  Preference of office, provider, location: General surgery [facility], [id number]  If referral order, have you been seen by this specialty before? Yes (If Yes, this issue or another issue? When? Where?  Can we respond through MyChart? No >> Apr 06, 2024  2:20 PM Larissa RAMAN wrote: Newell with [facility] states that referral has not been received and must be completed on [facility] website.  Callback # [phone].

## 2024-04-18 ENCOUNTER — Encounter: Payer: Self-pay | Admitting: Internal Medicine

## 2024-04-18 ENCOUNTER — Encounter: Payer: Self-pay | Admitting: Pulmonary Disease

## 2024-04-18 NOTE — Telephone Encounter (Signed)
 Pt requesting refill on Temazepam

## 2024-04-19 ENCOUNTER — Other Ambulatory Visit: Payer: Self-pay | Admitting: Pulmonary Disease

## 2024-04-19 MED ORDER — TEMAZEPAM 15 MG PO CAPS: 15.0000 mg | ORAL_CAPSULE | Freq: Every day | ORAL | 1 refills | Status: AC

## 2024-04-21 ENCOUNTER — Encounter: Payer: Self-pay | Admitting: Family Medicine

## 2024-04-21 ENCOUNTER — Ambulatory Visit: Admitting: Family Medicine

## 2024-04-21 VITALS — BP 132/86 | HR 99 | Ht 64.0 in | Wt 171.2 lb

## 2024-04-21 DIAGNOSIS — R109 Unspecified abdominal pain: Secondary | ICD-10-CM

## 2024-04-21 DIAGNOSIS — F17200 Nicotine dependence, unspecified, uncomplicated: Secondary | ICD-10-CM

## 2024-04-21 DIAGNOSIS — R110 Nausea: Secondary | ICD-10-CM

## 2024-04-21 DIAGNOSIS — F419 Anxiety disorder, unspecified: Secondary | ICD-10-CM

## 2024-04-21 MED ORDER — DICYCLOMINE HCL 10 MG PO CAPS: 10.0000 mg | ORAL_CAPSULE | Freq: Three times a day (TID) | ORAL | 0 refills | Status: AC

## 2024-04-21 MED ORDER — HYDROCHLOROTHIAZIDE 25 MG PO TABS: 25.0000 mg | ORAL_TABLET | Freq: Every day | ORAL | 0 refills | Status: AC

## 2024-04-21 MED ORDER — ONDANSETRON 4 MG PO TBDP: 4.0000 mg | ORAL_TABLET | Freq: Three times a day (TID) | ORAL | 0 refills | Status: AC

## 2024-04-21 MED ORDER — NICOTINE POLACRILEX 4 MG MT GUM: 4.0000 mg | CHEWING_GUM | OROMUCOSAL | 0 refills | Status: AC | PRN

## 2024-04-22 ENCOUNTER — Encounter: Payer: Self-pay | Admitting: Family Medicine

## 2024-04-22 NOTE — Progress Notes (Signed)
 "  Established Patient Office Visit  Subjective    Patient ID: Natalie Edwards, female    DOB: 1966/02/21  Age: 59 y.o. MRN: [id number]  CC:  Chief Complaint  Patient presents with   Medical Management of Chronic Issues    Pt reports still having same symptoms she had when she went to the hospital in August and October 2025    HPI Natalie Edwards presents with complaint of abdominal pain. She reports that she has been seen at hospital in August and October for same symptoms and does have an upcoming follow up with GI. She has not been taking the recommended dicyclomine  for her sx.   Outpatient Encounter Medications as of 04/21/2024  Medication Sig   gabapentin  (NEURONTIN ) 300 MG capsule Take 300 mg by mouth daily as needed (as needed for pain; pt reports mostly taking at night).   mirtazapine  (REMERON ) 45 MG tablet TAKE 1 TABLET BY MOUTH AT  BEDTIME   nicotine  polacrilex (NICORETTE ) 4 MG gum Take 1 each (4 mg total) by mouth as needed for smoking cessation.   ondansetron  (ZOFRAN ) 4 MG tablet Take 1 tablet (4 mg total) by mouth every 6 (six) hours.   [DISCONTINUED] hydrochlorothiazide  (HYDRODIURIL ) 25 MG tablet Take 25 mg by mouth daily.   [DISCONTINUED] ondansetron  (ZOFRAN -ODT) 4 MG disintegrating tablet Take 1 tablet (4 mg total) by mouth every 8 (eight) hours.   dicyclomine  (BENTYL ) 10 MG capsule Take 1 capsule (10 mg total) by mouth 3 (three) times daily before meals.   FLUoxetine  (PROZAC ) 40 MG capsule Take 1 capsule (40 mg total) by mouth daily. (Patient taking differently: Take 40 mg by mouth 2 (two) times daily.)   hydrochlorothiazide  (HYDRODIURIL ) 25 MG tablet Take 1 tablet (25 mg total) by mouth daily.   HYDROcodone -acetaminophen  (NORCO/VICODIN) 5-325 MG tablet Take 1 tablet by mouth every 6 (six) hours as needed. (Patient not taking: Reported on 02/04/2024)   metoCLOPramide  (REGLAN ) 10 MG tablet Take 1 tablet (10 mg total) by mouth every 6 (six) hours. (Patient not taking: Reported on  02/04/2024)   nicotine  (NICODERM CQ  - DOSED IN MG/24 HOURS) 14 mg/24hr patch Place 1 patch (14 mg total) onto the skin daily at 6 (six) AM. (Patient not taking: Reported on 02/04/2024)   ondansetron  (ZOFRAN -ODT) 4 MG disintegrating tablet Take 1 tablet (4 mg total) by mouth every 8 (eight) hours.   pantoprazole  (PROTONIX ) 40 MG tablet Take 1 tablet (40 mg total) by mouth 2 (two) times daily. (Patient not taking: Reported on 04/21/2024)   polyethylene glycol powder (GLYCOLAX /MIRALAX ) 17 GM/SCOOP powder Take 17 g by mouth daily. Dissolve 1 capful (17g) in 4-8 ounces of liquid and take by mouth daily.   propranolol  (INDERAL ) 10 MG tablet Take 10 mg by mouth daily. (Patient not taking: Reported on 02/04/2024)   sucralfate  (CARAFATE ) 1 GM/10ML suspension Take 10 mLs (1 g total) by mouth 4 (four) times daily -  with meals and at bedtime. (Patient not taking: Reported on 04/21/2024)   temazepam  (RESTORIL ) 15 MG capsule Take 1 capsule (15 mg total) by mouth at bedtime.   [DISCONTINUED] dicyclomine  (BENTYL ) 10 MG capsule Take 1 capsule (10 mg total) by mouth 3 (three) times daily before meals.   [DISCONTINUED] oxyCODONE  (ROXICODONE ) 5 MG immediate release tablet Take 1 tablet (5 mg total) by mouth every 6 (six) hours as needed for severe pain (pain score 7-10).   [DISCONTINUED] oxyCODONE -acetaminophen  (PERCOCET/ROXICET) 5-325 MG tablet Take 1 tablet by mouth every 8 (eight) hours as needed  for severe pain (pain score 7-10).   No facility-administered encounter medications on file as of 04/21/2024.    Past Medical History:  Diagnosis Date   Allergy    Anemia    Diverticulosis    Gall bladder stones 1993   Gastric ulcer    GERD (gastroesophageal reflux disease)    Hypertension    Renal mass     Past Surgical History:  Procedure Laterality Date   ABDOMINAL HYSTERECTOMY     BIOPSY  09/29/2022   Procedure: BIOPSY;  Surgeon: Shila Gustav GAILS, MD;  Location: [facility];  Service: Gastroenterology;;    BONE BIOPSY  12/26/2023   Procedure: BIOPSY, GI;  Surgeon: Leigh Elspeth SQUIBB, MD;  Location: [facility];  Service: Gastroenterology;;   CHOLECYSTECTOMY     COLONOSCOPY WITH ESOPHAGOGASTRODUODENOSCOPY (EGD)  04/07/2023   [facility]   ESOPHAGOGASTRODUODENOSCOPY N/A 12/26/2023   Procedure: EGD (ESOPHAGOGASTRODUODENOSCOPY);  Surgeon: Leigh Elspeth SQUIBB, MD;  Location: [facility] ENDOSCOPY;  Service: Gastroenterology;  Laterality: N/A;   ESOPHAGOGASTRODUODENOSCOPY (EGD) WITH PROPOFOL  N/A 09/29/2022   Procedure: ESOPHAGOGASTRODUODENOSCOPY (EGD) WITH PROPOFOL ;  Surgeon: Shila Gustav GAILS, MD;  Location: [facility];  Service: Gastroenterology;  Laterality: N/A;   LAPAROSCOPIC SIGMOID COLECTOMY N/A 10/27/2023   Procedure: COLECTOMY, SIGMOID, LAPAROSCOPIC;  Surgeon: Lyndel Deward PARAS, MD;  Location: [facility] OR;  Service: General;  Laterality: N/A;   MOBILIZATION, SPLENIC FLEXURE, WITH PARTIAL COLECTOMY N/A 10/27/2023   Procedure: MOBILIZATION, SPLENIC FLEXURE;  Surgeon: Lyndel Deward PARAS, MD;  Location: [facility] OR;  Service: General;  Laterality: N/A;    Family History  Problem Relation Age of Onset   Cancer Maternal Uncle        Lung   Cancer Maternal Uncle        Lung   Cancer Maternal Uncle        Lung   Headache Neg Hx        I don't think so   Migraines Neg Hx        I don't think so    Social History   Socioeconomic History   Marital status: Single    Spouse name: Not on file   Number of children: 3   Years of education: Not on file   Highest education level: 12th grade  Occupational History   Occupation: environmental services at [employer]  Tobacco Use   Smoking status: Every Day    Current packs/day: 0.50    Average packs/day: 0.5 packs/day for 25.0 years (12.5 ttl pk-yrs)    Types: Cigarettes   Smokeless tobacco: Never   Tobacco comments:    Pt tried to quit - helps with anxiety     1 pack last patient 3 days   Vaping Use   Vaping status: Never Used  Substance  and Sexual Activity   Alcohol use: Yes    Comment: occasionally - last drink was July 4, 1 shot   Drug use: No   Sexual activity: Yes    Comment: hysterectomy  Other Topics Concern   Not on file  Social History Narrative   Lives at home in an apartment. Her mother lives with her.    Right handed   Caffeine: drinks approx. 36 oz of pepsi per day. Sometimes drinks 2 cups of coffee in a day as well.    Social Drivers of Health   Tobacco Use: High Risk (04/21/2024)   Patient History    Smoking Tobacco Use: Every Day    Smokeless Tobacco Use: Never  Passive Exposure: Not on file  Financial Resource Strain: Medium Risk (12/28/2023)   Overall Financial Resource Strain (CARDIA)    Difficulty of Paying Living Expenses: Somewhat hard  Food Insecurity: Food Insecurity Present (12/28/2023)   Epic    Worried About Programme Researcher, Broadcasting/film/video in the Last Year: Sometimes true    Ran Out of Food in the Last Year: Sometimes true  Transportation Needs: No Transportation Needs (12/28/2023)   Epic    Lack of Transportation (Medical): No    Lack of Transportation (Non-Medical): No  Recent Concern: Transportation Needs - Unmet Transportation Needs (12/23/2023)   Epic    Lack of Transportation (Medical): No    Lack of Transportation (Non-Medical): Yes  Physical Activity: Insufficiently Active (12/28/2023)   Exercise Vital Sign    Days of Exercise per Week: 1 day    Minutes of Exercise per Session: 10 min  Stress: Stress Concern Present (12/28/2023)   [employer] of Occupational Health - Occupational Stress Questionnaire    Feeling of Stress: To some extent  Social Connections: Socially Isolated (12/28/2023)   Social Connection and Isolation Panel    Frequency of Communication with Friends and Family: More than three times a week    Frequency of Social Gatherings with Friends and Family: Once a week    Attends Religious Services: Never    Database Administrator or Organizations: No    Attends  Engineer, Structural: Not on file    Marital Status: Separated  Intimate Partner Violence: Unknown (12/23/2023)   Epic    Fear of Current or Ex-Partner: Patient declined    Emotionally Abused: No    Physically Abused: No    Sexually Abused: No  Depression (PHQ2-9): High Risk (07/07/2023)   Depression (PHQ2-9)    PHQ-2 Score: 18  Alcohol Screen: Low Risk (12/28/2023)   Alcohol Screen    Last Alcohol Screening Score (AUDIT): 0  Housing: High Risk (12/28/2023)   Epic    Unable to Pay for Housing in the Last Year: Yes    Number of Times Moved in the Last Year: 2    Homeless in the Last Year: No  Utilities: Patient Declined (12/23/2023)   Epic    Threatened with loss of utilities: Patient declined  Health Literacy: Adequate Health Literacy (07/07/2023)   B1300 Health Literacy    Frequency of need for help with medical instructions: Never    Review of Systems  Constitutional:  Negative for chills, fever and weight loss.  Gastrointestinal:  Positive for abdominal pain and nausea.  All other systems reviewed and are negative.       Objective    BP 132/86   Pulse 99   Ht 5' 4 (1.626 m)   Wt 171 lb 3.2 oz (77.7 kg)   SpO2 94%   BMI 29.39 kg/m   Physical Exam Vitals and nursing note reviewed.  Constitutional:      General: She is not in acute distress. Cardiovascular:     Rate and Rhythm: Normal rate and regular rhythm.  Pulmonary:     Effort: Pulmonary effort is normal.     Breath sounds: Normal breath sounds.  Abdominal:     Palpations: Abdomen is soft.     Tenderness: There is abdominal tenderness.  Neurological:     General: No focal deficit present.     Mental Status: She is alert and oriented to person, place, and time.         Assessment & Plan:  1. Abdominal pain, unspecified abdominal location (Primary) Keep scheduled appt with consultant. Take dicyclomine  as recommended.   2. Nausea without vomiting  - ondansetron  (ZOFRAN -ODT) 4 MG  disintegrating tablet; Take 1 tablet (4 mg total) by mouth every 8 (eight) hours.  Dispense: 30 tablet; Refill: 0  3. Anxiety and depression Appears stable. Continue   4. Smoker Discussed reduction/cessation.     No follow-ups on file.   Tanda Raguel SQUIBB, MD  "

## 2024-05-09 ENCOUNTER — Ambulatory Visit: Admitting: Gastroenterology

## 2024-07-18 ENCOUNTER — Ambulatory Visit: Admitting: Pulmonary Disease
